# Patient Record
Sex: Male | Born: 1935 | Race: White | Hispanic: No | Marital: Single | Smoking: Former smoker
Health system: Southern US, Community
[De-identification: ages and names within clinical notes are randomized; demographics above are authoritative.]

## PROBLEM LIST (undated history)

## (undated) DIAGNOSIS — M1990 Unspecified osteoarthritis, unspecified site: Secondary | ICD-10-CM

## (undated) DIAGNOSIS — H409 Unspecified glaucoma: Secondary | ICD-10-CM

## (undated) DIAGNOSIS — I10 Essential (primary) hypertension: Secondary | ICD-10-CM

## (undated) DIAGNOSIS — I499 Cardiac arrhythmia, unspecified: Secondary | ICD-10-CM

## (undated) HISTORY — PX: JOINT REPLACEMENT: SHX530

## (undated) HISTORY — PX: EYE SURGERY: SHX253

## (undated) HISTORY — PX: OTHER SURGICAL HISTORY: SHX169

---

## 1998-10-09 ENCOUNTER — Ambulatory Visit (HOSPITAL_COMMUNITY): Admission: RE | Admit: 1998-10-09 | Discharge: 1998-10-09 | Payer: Self-pay | Admitting: Unknown Physician Specialty

## 1999-10-16 ENCOUNTER — Ambulatory Visit (HOSPITAL_COMMUNITY): Admission: RE | Admit: 1999-10-16 | Discharge: 1999-10-16 | Payer: Self-pay | Admitting: Unknown Physician Specialty

## 1999-10-16 ENCOUNTER — Encounter: Payer: Self-pay | Admitting: Unknown Physician Specialty

## 2005-11-11 ENCOUNTER — Ambulatory Visit: Payer: Self-pay | Admitting: Family Medicine

## 2014-09-13 ENCOUNTER — Encounter (INDEPENDENT_AMBULATORY_CARE_PROVIDER_SITE_OTHER): Payer: Self-pay | Admitting: Ophthalmology

## 2014-10-13 ENCOUNTER — Encounter (INDEPENDENT_AMBULATORY_CARE_PROVIDER_SITE_OTHER): Payer: Medicare HMO | Admitting: Ophthalmology

## 2014-10-13 DIAGNOSIS — H43812 Vitreous degeneration, left eye: Secondary | ICD-10-CM

## 2014-10-13 DIAGNOSIS — H35033 Hypertensive retinopathy, bilateral: Secondary | ICD-10-CM

## 2014-10-13 DIAGNOSIS — H35372 Puckering of macula, left eye: Secondary | ICD-10-CM

## 2014-10-13 DIAGNOSIS — H43813 Vitreous degeneration, bilateral: Secondary | ICD-10-CM

## 2014-10-13 DIAGNOSIS — I10 Essential (primary) hypertension: Secondary | ICD-10-CM

## 2014-11-14 ENCOUNTER — Encounter (INDEPENDENT_AMBULATORY_CARE_PROVIDER_SITE_OTHER): Payer: Medicare HMO | Admitting: Ophthalmology

## 2014-11-14 DIAGNOSIS — I10 Essential (primary) hypertension: Secondary | ICD-10-CM | POA: Diagnosis not present

## 2014-11-14 DIAGNOSIS — H43813 Vitreous degeneration, bilateral: Secondary | ICD-10-CM

## 2014-11-14 DIAGNOSIS — H3531 Nonexudative age-related macular degeneration: Secondary | ICD-10-CM

## 2014-11-14 DIAGNOSIS — H34832 Tributary (branch) retinal vein occlusion, left eye: Secondary | ICD-10-CM

## 2014-11-14 DIAGNOSIS — H35033 Hypertensive retinopathy, bilateral: Secondary | ICD-10-CM

## 2014-11-14 DIAGNOSIS — H35372 Puckering of macula, left eye: Secondary | ICD-10-CM | POA: Diagnosis not present

## 2014-12-12 ENCOUNTER — Encounter (INDEPENDENT_AMBULATORY_CARE_PROVIDER_SITE_OTHER): Payer: Medicare HMO | Admitting: Ophthalmology

## 2014-12-12 DIAGNOSIS — I10 Essential (primary) hypertension: Secondary | ICD-10-CM

## 2014-12-12 DIAGNOSIS — H43813 Vitreous degeneration, bilateral: Secondary | ICD-10-CM

## 2014-12-12 DIAGNOSIS — D3131 Benign neoplasm of right choroid: Secondary | ICD-10-CM

## 2014-12-12 DIAGNOSIS — H34832 Tributary (branch) retinal vein occlusion, left eye: Secondary | ICD-10-CM

## 2014-12-12 DIAGNOSIS — H35372 Puckering of macula, left eye: Secondary | ICD-10-CM

## 2014-12-12 DIAGNOSIS — H35033 Hypertensive retinopathy, bilateral: Secondary | ICD-10-CM

## 2015-01-23 ENCOUNTER — Encounter (INDEPENDENT_AMBULATORY_CARE_PROVIDER_SITE_OTHER): Payer: Medicare HMO | Admitting: Ophthalmology

## 2015-01-23 DIAGNOSIS — I10 Essential (primary) hypertension: Secondary | ICD-10-CM

## 2015-01-23 DIAGNOSIS — H43813 Vitreous degeneration, bilateral: Secondary | ICD-10-CM

## 2015-01-23 DIAGNOSIS — H35372 Puckering of macula, left eye: Secondary | ICD-10-CM

## 2015-01-23 DIAGNOSIS — D3131 Benign neoplasm of right choroid: Secondary | ICD-10-CM

## 2015-01-23 DIAGNOSIS — H35033 Hypertensive retinopathy, bilateral: Secondary | ICD-10-CM

## 2015-01-23 DIAGNOSIS — H34832 Tributary (branch) retinal vein occlusion, left eye: Secondary | ICD-10-CM

## 2015-03-06 ENCOUNTER — Encounter (INDEPENDENT_AMBULATORY_CARE_PROVIDER_SITE_OTHER): Payer: Medicare HMO | Admitting: Ophthalmology

## 2015-03-06 DIAGNOSIS — H43813 Vitreous degeneration, bilateral: Secondary | ICD-10-CM

## 2015-03-06 DIAGNOSIS — H35372 Puckering of macula, left eye: Secondary | ICD-10-CM

## 2015-03-06 DIAGNOSIS — H35033 Hypertensive retinopathy, bilateral: Secondary | ICD-10-CM | POA: Diagnosis not present

## 2015-03-06 DIAGNOSIS — D3131 Benign neoplasm of right choroid: Secondary | ICD-10-CM | POA: Diagnosis not present

## 2015-03-06 DIAGNOSIS — H34832 Tributary (branch) retinal vein occlusion, left eye: Secondary | ICD-10-CM | POA: Diagnosis not present

## 2015-03-06 DIAGNOSIS — I10 Essential (primary) hypertension: Secondary | ICD-10-CM

## 2015-04-24 ENCOUNTER — Encounter (INDEPENDENT_AMBULATORY_CARE_PROVIDER_SITE_OTHER): Payer: Medicare HMO | Admitting: Ophthalmology

## 2015-04-24 DIAGNOSIS — H3531 Nonexudative age-related macular degeneration: Secondary | ICD-10-CM

## 2015-04-24 DIAGNOSIS — H43813 Vitreous degeneration, bilateral: Secondary | ICD-10-CM

## 2015-04-24 DIAGNOSIS — H34832 Tributary (branch) retinal vein occlusion, left eye: Secondary | ICD-10-CM

## 2015-04-24 DIAGNOSIS — H35372 Puckering of macula, left eye: Secondary | ICD-10-CM | POA: Diagnosis not present

## 2015-04-24 DIAGNOSIS — H35033 Hypertensive retinopathy, bilateral: Secondary | ICD-10-CM | POA: Diagnosis not present

## 2015-04-24 DIAGNOSIS — D3131 Benign neoplasm of right choroid: Secondary | ICD-10-CM | POA: Diagnosis not present

## 2015-04-24 DIAGNOSIS — I10 Essential (primary) hypertension: Secondary | ICD-10-CM | POA: Diagnosis not present

## 2015-06-12 ENCOUNTER — Encounter (INDEPENDENT_AMBULATORY_CARE_PROVIDER_SITE_OTHER): Payer: Medicare HMO | Admitting: Ophthalmology

## 2015-06-12 DIAGNOSIS — H35033 Hypertensive retinopathy, bilateral: Secondary | ICD-10-CM

## 2015-06-12 DIAGNOSIS — H3531 Nonexudative age-related macular degeneration: Secondary | ICD-10-CM | POA: Diagnosis not present

## 2015-06-12 DIAGNOSIS — H43813 Vitreous degeneration, bilateral: Secondary | ICD-10-CM | POA: Diagnosis not present

## 2015-06-12 DIAGNOSIS — I10 Essential (primary) hypertension: Secondary | ICD-10-CM | POA: Diagnosis not present

## 2015-06-12 DIAGNOSIS — D3131 Benign neoplasm of right choroid: Secondary | ICD-10-CM

## 2015-06-12 DIAGNOSIS — H34832 Tributary (branch) retinal vein occlusion, left eye: Secondary | ICD-10-CM

## 2015-06-12 DIAGNOSIS — H35372 Puckering of macula, left eye: Secondary | ICD-10-CM

## 2015-07-17 ENCOUNTER — Encounter (INDEPENDENT_AMBULATORY_CARE_PROVIDER_SITE_OTHER): Payer: Medicare HMO | Admitting: Ophthalmology

## 2015-07-17 DIAGNOSIS — H35033 Hypertensive retinopathy, bilateral: Secondary | ICD-10-CM

## 2015-07-17 DIAGNOSIS — H34832 Tributary (branch) retinal vein occlusion, left eye: Secondary | ICD-10-CM | POA: Diagnosis not present

## 2015-07-17 DIAGNOSIS — H3531 Nonexudative age-related macular degeneration: Secondary | ICD-10-CM | POA: Diagnosis not present

## 2015-07-17 DIAGNOSIS — D3131 Benign neoplasm of right choroid: Secondary | ICD-10-CM

## 2015-07-17 DIAGNOSIS — H43813 Vitreous degeneration, bilateral: Secondary | ICD-10-CM | POA: Diagnosis not present

## 2015-07-17 DIAGNOSIS — H35372 Puckering of macula, left eye: Secondary | ICD-10-CM | POA: Diagnosis not present

## 2015-07-17 DIAGNOSIS — I10 Essential (primary) hypertension: Secondary | ICD-10-CM | POA: Diagnosis not present

## 2015-08-23 ENCOUNTER — Encounter (INDEPENDENT_AMBULATORY_CARE_PROVIDER_SITE_OTHER): Payer: Medicare HMO | Admitting: Ophthalmology

## 2015-08-23 DIAGNOSIS — D3131 Benign neoplasm of right choroid: Secondary | ICD-10-CM

## 2015-08-23 DIAGNOSIS — I10 Essential (primary) hypertension: Secondary | ICD-10-CM

## 2015-08-23 DIAGNOSIS — H34832 Tributary (branch) retinal vein occlusion, left eye: Secondary | ICD-10-CM | POA: Diagnosis not present

## 2015-08-23 DIAGNOSIS — H35372 Puckering of macula, left eye: Secondary | ICD-10-CM | POA: Diagnosis not present

## 2015-08-23 DIAGNOSIS — H43813 Vitreous degeneration, bilateral: Secondary | ICD-10-CM

## 2015-08-23 DIAGNOSIS — H35033 Hypertensive retinopathy, bilateral: Secondary | ICD-10-CM

## 2015-10-02 ENCOUNTER — Encounter (INDEPENDENT_AMBULATORY_CARE_PROVIDER_SITE_OTHER): Payer: Medicare HMO | Admitting: Ophthalmology

## 2015-10-10 ENCOUNTER — Encounter (INDEPENDENT_AMBULATORY_CARE_PROVIDER_SITE_OTHER): Payer: Medicare HMO | Admitting: Ophthalmology

## 2015-10-10 DIAGNOSIS — I10 Essential (primary) hypertension: Secondary | ICD-10-CM | POA: Diagnosis not present

## 2015-10-10 DIAGNOSIS — H353132 Nonexudative age-related macular degeneration, bilateral, intermediate dry stage: Secondary | ICD-10-CM | POA: Diagnosis not present

## 2015-10-10 DIAGNOSIS — D3131 Benign neoplasm of right choroid: Secondary | ICD-10-CM

## 2015-10-10 DIAGNOSIS — H35033 Hypertensive retinopathy, bilateral: Secondary | ICD-10-CM | POA: Diagnosis not present

## 2015-10-10 DIAGNOSIS — H35372 Puckering of macula, left eye: Secondary | ICD-10-CM

## 2015-10-10 DIAGNOSIS — H34832 Tributary (branch) retinal vein occlusion, left eye, with macular edema: Secondary | ICD-10-CM

## 2015-11-22 ENCOUNTER — Encounter (INDEPENDENT_AMBULATORY_CARE_PROVIDER_SITE_OTHER): Payer: Medicare HMO | Admitting: Ophthalmology

## 2015-11-22 DIAGNOSIS — H35033 Hypertensive retinopathy, bilateral: Secondary | ICD-10-CM

## 2015-11-22 DIAGNOSIS — I10 Essential (primary) hypertension: Secondary | ICD-10-CM

## 2015-11-22 DIAGNOSIS — H43813 Vitreous degeneration, bilateral: Secondary | ICD-10-CM | POA: Diagnosis not present

## 2015-11-22 DIAGNOSIS — H35372 Puckering of macula, left eye: Secondary | ICD-10-CM

## 2015-11-22 DIAGNOSIS — H353131 Nonexudative age-related macular degeneration, bilateral, early dry stage: Secondary | ICD-10-CM

## 2015-11-22 DIAGNOSIS — D3131 Benign neoplasm of right choroid: Secondary | ICD-10-CM

## 2015-11-22 DIAGNOSIS — H34832 Tributary (branch) retinal vein occlusion, left eye, with macular edema: Secondary | ICD-10-CM | POA: Diagnosis not present

## 2016-01-17 ENCOUNTER — Encounter (INDEPENDENT_AMBULATORY_CARE_PROVIDER_SITE_OTHER): Payer: Medicare HMO | Admitting: Ophthalmology

## 2016-01-17 DIAGNOSIS — H35372 Puckering of macula, left eye: Secondary | ICD-10-CM

## 2016-01-17 DIAGNOSIS — I10 Essential (primary) hypertension: Secondary | ICD-10-CM | POA: Diagnosis not present

## 2016-01-17 DIAGNOSIS — H353121 Nonexudative age-related macular degeneration, left eye, early dry stage: Secondary | ICD-10-CM

## 2016-01-17 DIAGNOSIS — H43813 Vitreous degeneration, bilateral: Secondary | ICD-10-CM | POA: Diagnosis not present

## 2016-01-17 DIAGNOSIS — H35033 Hypertensive retinopathy, bilateral: Secondary | ICD-10-CM

## 2016-01-17 DIAGNOSIS — H34832 Tributary (branch) retinal vein occlusion, left eye, with macular edema: Secondary | ICD-10-CM | POA: Diagnosis not present

## 2016-01-17 DIAGNOSIS — H353112 Nonexudative age-related macular degeneration, right eye, intermediate dry stage: Secondary | ICD-10-CM

## 2016-03-20 ENCOUNTER — Encounter (INDEPENDENT_AMBULATORY_CARE_PROVIDER_SITE_OTHER): Payer: Medicare HMO | Admitting: Ophthalmology

## 2016-03-20 DIAGNOSIS — H34832 Tributary (branch) retinal vein occlusion, left eye, with macular edema: Secondary | ICD-10-CM | POA: Diagnosis not present

## 2016-03-20 DIAGNOSIS — H353132 Nonexudative age-related macular degeneration, bilateral, intermediate dry stage: Secondary | ICD-10-CM

## 2016-03-20 DIAGNOSIS — I10 Essential (primary) hypertension: Secondary | ICD-10-CM | POA: Diagnosis not present

## 2016-03-20 DIAGNOSIS — H43813 Vitreous degeneration, bilateral: Secondary | ICD-10-CM | POA: Diagnosis not present

## 2016-03-20 DIAGNOSIS — H35033 Hypertensive retinopathy, bilateral: Secondary | ICD-10-CM | POA: Diagnosis not present

## 2016-05-29 ENCOUNTER — Encounter (INDEPENDENT_AMBULATORY_CARE_PROVIDER_SITE_OTHER): Payer: Medicare HMO | Admitting: Ophthalmology

## 2016-05-29 DIAGNOSIS — H35033 Hypertensive retinopathy, bilateral: Secondary | ICD-10-CM | POA: Diagnosis not present

## 2016-05-29 DIAGNOSIS — I10 Essential (primary) hypertension: Secondary | ICD-10-CM | POA: Diagnosis not present

## 2016-05-29 DIAGNOSIS — H35372 Puckering of macula, left eye: Secondary | ICD-10-CM

## 2016-05-29 DIAGNOSIS — H34832 Tributary (branch) retinal vein occlusion, left eye, with macular edema: Secondary | ICD-10-CM

## 2016-05-29 DIAGNOSIS — H353132 Nonexudative age-related macular degeneration, bilateral, intermediate dry stage: Secondary | ICD-10-CM

## 2016-05-29 DIAGNOSIS — H43813 Vitreous degeneration, bilateral: Secondary | ICD-10-CM

## 2016-08-07 ENCOUNTER — Encounter (INDEPENDENT_AMBULATORY_CARE_PROVIDER_SITE_OTHER): Payer: Medicare HMO | Admitting: Ophthalmology

## 2016-08-07 DIAGNOSIS — H34832 Tributary (branch) retinal vein occlusion, left eye, with macular edema: Secondary | ICD-10-CM | POA: Diagnosis not present

## 2016-08-07 DIAGNOSIS — D3131 Benign neoplasm of right choroid: Secondary | ICD-10-CM

## 2016-08-07 DIAGNOSIS — H35033 Hypertensive retinopathy, bilateral: Secondary | ICD-10-CM

## 2016-08-07 DIAGNOSIS — I10 Essential (primary) hypertension: Secondary | ICD-10-CM

## 2016-08-07 DIAGNOSIS — H43813 Vitreous degeneration, bilateral: Secondary | ICD-10-CM | POA: Diagnosis not present

## 2016-08-07 DIAGNOSIS — H353131 Nonexudative age-related macular degeneration, bilateral, early dry stage: Secondary | ICD-10-CM

## 2016-10-09 ENCOUNTER — Encounter (INDEPENDENT_AMBULATORY_CARE_PROVIDER_SITE_OTHER): Payer: Medicare HMO | Admitting: Ophthalmology

## 2016-10-09 DIAGNOSIS — I10 Essential (primary) hypertension: Secondary | ICD-10-CM

## 2016-10-09 DIAGNOSIS — H35033 Hypertensive retinopathy, bilateral: Secondary | ICD-10-CM

## 2016-10-09 DIAGNOSIS — H35372 Puckering of macula, left eye: Secondary | ICD-10-CM | POA: Diagnosis not present

## 2016-10-09 DIAGNOSIS — H43813 Vitreous degeneration, bilateral: Secondary | ICD-10-CM | POA: Diagnosis not present

## 2016-10-09 DIAGNOSIS — D3131 Benign neoplasm of right choroid: Secondary | ICD-10-CM | POA: Diagnosis not present

## 2016-10-09 DIAGNOSIS — H353132 Nonexudative age-related macular degeneration, bilateral, intermediate dry stage: Secondary | ICD-10-CM

## 2016-10-09 DIAGNOSIS — H34832 Tributary (branch) retinal vein occlusion, left eye, with macular edema: Secondary | ICD-10-CM | POA: Diagnosis not present

## 2016-12-11 ENCOUNTER — Encounter (INDEPENDENT_AMBULATORY_CARE_PROVIDER_SITE_OTHER): Payer: Medicare HMO | Admitting: Ophthalmology

## 2016-12-11 DIAGNOSIS — H35033 Hypertensive retinopathy, bilateral: Secondary | ICD-10-CM

## 2016-12-11 DIAGNOSIS — D3131 Benign neoplasm of right choroid: Secondary | ICD-10-CM | POA: Diagnosis not present

## 2016-12-11 DIAGNOSIS — H34832 Tributary (branch) retinal vein occlusion, left eye, with macular edema: Secondary | ICD-10-CM | POA: Diagnosis not present

## 2016-12-11 DIAGNOSIS — H43813 Vitreous degeneration, bilateral: Secondary | ICD-10-CM

## 2016-12-11 DIAGNOSIS — H35372 Puckering of macula, left eye: Secondary | ICD-10-CM

## 2016-12-11 DIAGNOSIS — I10 Essential (primary) hypertension: Secondary | ICD-10-CM | POA: Diagnosis not present

## 2016-12-11 DIAGNOSIS — H353132 Nonexudative age-related macular degeneration, bilateral, intermediate dry stage: Secondary | ICD-10-CM | POA: Diagnosis not present

## 2017-02-12 ENCOUNTER — Encounter (INDEPENDENT_AMBULATORY_CARE_PROVIDER_SITE_OTHER): Payer: Medicare HMO | Admitting: Ophthalmology

## 2017-02-12 DIAGNOSIS — H34832 Tributary (branch) retinal vein occlusion, left eye, with macular edema: Secondary | ICD-10-CM | POA: Diagnosis not present

## 2017-02-12 DIAGNOSIS — H35372 Puckering of macula, left eye: Secondary | ICD-10-CM | POA: Diagnosis not present

## 2017-02-12 DIAGNOSIS — H353132 Nonexudative age-related macular degeneration, bilateral, intermediate dry stage: Secondary | ICD-10-CM | POA: Diagnosis not present

## 2017-02-12 DIAGNOSIS — H35033 Hypertensive retinopathy, bilateral: Secondary | ICD-10-CM | POA: Diagnosis not present

## 2017-02-12 DIAGNOSIS — D3131 Benign neoplasm of right choroid: Secondary | ICD-10-CM

## 2017-02-12 DIAGNOSIS — I10 Essential (primary) hypertension: Secondary | ICD-10-CM | POA: Diagnosis not present

## 2017-02-12 DIAGNOSIS — H43813 Vitreous degeneration, bilateral: Secondary | ICD-10-CM | POA: Diagnosis not present

## 2017-04-23 ENCOUNTER — Encounter (INDEPENDENT_AMBULATORY_CARE_PROVIDER_SITE_OTHER): Payer: Medicare HMO | Admitting: Ophthalmology

## 2017-04-23 DIAGNOSIS — H43813 Vitreous degeneration, bilateral: Secondary | ICD-10-CM

## 2017-04-23 DIAGNOSIS — I10 Essential (primary) hypertension: Secondary | ICD-10-CM | POA: Diagnosis not present

## 2017-04-23 DIAGNOSIS — D3131 Benign neoplasm of right choroid: Secondary | ICD-10-CM | POA: Diagnosis not present

## 2017-04-23 DIAGNOSIS — H26492 Other secondary cataract, left eye: Secondary | ICD-10-CM | POA: Diagnosis not present

## 2017-04-23 DIAGNOSIS — H35033 Hypertensive retinopathy, bilateral: Secondary | ICD-10-CM | POA: Diagnosis not present

## 2017-04-23 DIAGNOSIS — H35372 Puckering of macula, left eye: Secondary | ICD-10-CM

## 2017-04-23 DIAGNOSIS — H34832 Tributary (branch) retinal vein occlusion, left eye, with macular edema: Secondary | ICD-10-CM | POA: Diagnosis not present

## 2017-07-02 ENCOUNTER — Encounter (INDEPENDENT_AMBULATORY_CARE_PROVIDER_SITE_OTHER): Payer: Medicare HMO | Admitting: Ophthalmology

## 2017-07-02 DIAGNOSIS — H353111 Nonexudative age-related macular degeneration, right eye, early dry stage: Secondary | ICD-10-CM

## 2017-07-02 DIAGNOSIS — D3131 Benign neoplasm of right choroid: Secondary | ICD-10-CM

## 2017-07-02 DIAGNOSIS — I10 Essential (primary) hypertension: Secondary | ICD-10-CM

## 2017-07-02 DIAGNOSIS — H35033 Hypertensive retinopathy, bilateral: Secondary | ICD-10-CM

## 2017-07-02 DIAGNOSIS — H353122 Nonexudative age-related macular degeneration, left eye, intermediate dry stage: Secondary | ICD-10-CM | POA: Diagnosis not present

## 2017-07-02 DIAGNOSIS — H43813 Vitreous degeneration, bilateral: Secondary | ICD-10-CM

## 2017-07-02 DIAGNOSIS — H34832 Tributary (branch) retinal vein occlusion, left eye, with macular edema: Secondary | ICD-10-CM | POA: Diagnosis not present

## 2017-07-02 DIAGNOSIS — H35372 Puckering of macula, left eye: Secondary | ICD-10-CM | POA: Diagnosis not present

## 2017-07-13 ENCOUNTER — Encounter (INDEPENDENT_AMBULATORY_CARE_PROVIDER_SITE_OTHER): Payer: Medicare HMO | Admitting: Ophthalmology

## 2017-07-13 DIAGNOSIS — H353132 Nonexudative age-related macular degeneration, bilateral, intermediate dry stage: Secondary | ICD-10-CM

## 2017-07-13 DIAGNOSIS — I10 Essential (primary) hypertension: Secondary | ICD-10-CM

## 2017-07-13 DIAGNOSIS — H35033 Hypertensive retinopathy, bilateral: Secondary | ICD-10-CM | POA: Diagnosis not present

## 2017-07-13 DIAGNOSIS — B0052 Herpesviral keratitis: Secondary | ICD-10-CM | POA: Diagnosis not present

## 2017-07-13 DIAGNOSIS — H43813 Vitreous degeneration, bilateral: Secondary | ICD-10-CM | POA: Diagnosis not present

## 2017-07-13 DIAGNOSIS — H34832 Tributary (branch) retinal vein occlusion, left eye, with macular edema: Secondary | ICD-10-CM

## 2017-09-03 ENCOUNTER — Encounter (INDEPENDENT_AMBULATORY_CARE_PROVIDER_SITE_OTHER): Payer: Medicare HMO | Admitting: Ophthalmology

## 2017-09-03 DIAGNOSIS — H26492 Other secondary cataract, left eye: Secondary | ICD-10-CM

## 2017-09-10 ENCOUNTER — Encounter (INDEPENDENT_AMBULATORY_CARE_PROVIDER_SITE_OTHER): Payer: Medicare HMO | Admitting: Ophthalmology

## 2017-09-10 DIAGNOSIS — H43813 Vitreous degeneration, bilateral: Secondary | ICD-10-CM

## 2017-09-10 DIAGNOSIS — I10 Essential (primary) hypertension: Secondary | ICD-10-CM | POA: Diagnosis not present

## 2017-09-10 DIAGNOSIS — H35033 Hypertensive retinopathy, bilateral: Secondary | ICD-10-CM

## 2017-09-10 DIAGNOSIS — D3131 Benign neoplasm of right choroid: Secondary | ICD-10-CM | POA: Diagnosis not present

## 2017-09-10 DIAGNOSIS — H34832 Tributary (branch) retinal vein occlusion, left eye, with macular edema: Secondary | ICD-10-CM

## 2017-09-10 DIAGNOSIS — H35372 Puckering of macula, left eye: Secondary | ICD-10-CM | POA: Diagnosis not present

## 2017-09-10 DIAGNOSIS — H353132 Nonexudative age-related macular degeneration, bilateral, intermediate dry stage: Secondary | ICD-10-CM | POA: Diagnosis not present

## 2017-11-19 ENCOUNTER — Encounter (INDEPENDENT_AMBULATORY_CARE_PROVIDER_SITE_OTHER): Payer: Medicare HMO | Admitting: Ophthalmology

## 2017-11-19 DIAGNOSIS — I10 Essential (primary) hypertension: Secondary | ICD-10-CM | POA: Diagnosis not present

## 2017-11-19 DIAGNOSIS — H34832 Tributary (branch) retinal vein occlusion, left eye, with macular edema: Secondary | ICD-10-CM | POA: Diagnosis not present

## 2017-11-19 DIAGNOSIS — D3131 Benign neoplasm of right choroid: Secondary | ICD-10-CM | POA: Diagnosis not present

## 2017-11-19 DIAGNOSIS — H353132 Nonexudative age-related macular degeneration, bilateral, intermediate dry stage: Secondary | ICD-10-CM | POA: Diagnosis not present

## 2017-11-19 DIAGNOSIS — H35372 Puckering of macula, left eye: Secondary | ICD-10-CM

## 2017-11-19 DIAGNOSIS — H35033 Hypertensive retinopathy, bilateral: Secondary | ICD-10-CM | POA: Diagnosis not present

## 2017-11-19 DIAGNOSIS — H43813 Vitreous degeneration, bilateral: Secondary | ICD-10-CM | POA: Diagnosis not present

## 2018-02-04 ENCOUNTER — Encounter (INDEPENDENT_AMBULATORY_CARE_PROVIDER_SITE_OTHER): Payer: Medicare HMO | Admitting: Ophthalmology

## 2018-02-04 DIAGNOSIS — I10 Essential (primary) hypertension: Secondary | ICD-10-CM

## 2018-02-04 DIAGNOSIS — H35372 Puckering of macula, left eye: Secondary | ICD-10-CM

## 2018-02-04 DIAGNOSIS — H353132 Nonexudative age-related macular degeneration, bilateral, intermediate dry stage: Secondary | ICD-10-CM | POA: Diagnosis not present

## 2018-02-04 DIAGNOSIS — H43813 Vitreous degeneration, bilateral: Secondary | ICD-10-CM

## 2018-02-04 DIAGNOSIS — H35033 Hypertensive retinopathy, bilateral: Secondary | ICD-10-CM

## 2018-02-04 DIAGNOSIS — H34832 Tributary (branch) retinal vein occlusion, left eye, with macular edema: Secondary | ICD-10-CM | POA: Diagnosis not present

## 2018-02-04 DIAGNOSIS — D3131 Benign neoplasm of right choroid: Secondary | ICD-10-CM | POA: Diagnosis not present

## 2018-03-25 ENCOUNTER — Encounter (INDEPENDENT_AMBULATORY_CARE_PROVIDER_SITE_OTHER): Payer: Medicare HMO | Admitting: Ophthalmology

## 2018-03-25 DIAGNOSIS — I10 Essential (primary) hypertension: Secondary | ICD-10-CM

## 2018-03-25 DIAGNOSIS — H34832 Tributary (branch) retinal vein occlusion, left eye, with macular edema: Secondary | ICD-10-CM

## 2018-03-25 DIAGNOSIS — H35033 Hypertensive retinopathy, bilateral: Secondary | ICD-10-CM | POA: Diagnosis not present

## 2018-03-25 DIAGNOSIS — D3131 Benign neoplasm of right choroid: Secondary | ICD-10-CM | POA: Diagnosis not present

## 2018-03-25 DIAGNOSIS — H353132 Nonexudative age-related macular degeneration, bilateral, intermediate dry stage: Secondary | ICD-10-CM | POA: Diagnosis not present

## 2018-03-25 DIAGNOSIS — H43813 Vitreous degeneration, bilateral: Secondary | ICD-10-CM

## 2018-03-25 DIAGNOSIS — H35372 Puckering of macula, left eye: Secondary | ICD-10-CM

## 2018-05-13 ENCOUNTER — Encounter (INDEPENDENT_AMBULATORY_CARE_PROVIDER_SITE_OTHER): Payer: Medicare HMO | Admitting: Ophthalmology

## 2018-05-13 DIAGNOSIS — H34832 Tributary (branch) retinal vein occlusion, left eye, with macular edema: Secondary | ICD-10-CM

## 2018-05-13 DIAGNOSIS — I10 Essential (primary) hypertension: Secondary | ICD-10-CM

## 2018-05-13 DIAGNOSIS — H353132 Nonexudative age-related macular degeneration, bilateral, intermediate dry stage: Secondary | ICD-10-CM

## 2018-05-13 DIAGNOSIS — H43813 Vitreous degeneration, bilateral: Secondary | ICD-10-CM

## 2018-05-13 DIAGNOSIS — D3131 Benign neoplasm of right choroid: Secondary | ICD-10-CM

## 2018-05-13 DIAGNOSIS — H35372 Puckering of macula, left eye: Secondary | ICD-10-CM

## 2018-05-13 DIAGNOSIS — H35033 Hypertensive retinopathy, bilateral: Secondary | ICD-10-CM | POA: Diagnosis not present

## 2018-07-01 ENCOUNTER — Encounter (INDEPENDENT_AMBULATORY_CARE_PROVIDER_SITE_OTHER): Payer: Medicare HMO | Admitting: Ophthalmology

## 2018-07-01 DIAGNOSIS — H43813 Vitreous degeneration, bilateral: Secondary | ICD-10-CM

## 2018-07-01 DIAGNOSIS — I10 Essential (primary) hypertension: Secondary | ICD-10-CM | POA: Diagnosis not present

## 2018-07-01 DIAGNOSIS — H35372 Puckering of macula, left eye: Secondary | ICD-10-CM

## 2018-07-01 DIAGNOSIS — D3131 Benign neoplasm of right choroid: Secondary | ICD-10-CM

## 2018-07-01 DIAGNOSIS — H34832 Tributary (branch) retinal vein occlusion, left eye, with macular edema: Secondary | ICD-10-CM | POA: Diagnosis not present

## 2018-07-01 DIAGNOSIS — H35033 Hypertensive retinopathy, bilateral: Secondary | ICD-10-CM

## 2018-07-01 DIAGNOSIS — H353131 Nonexudative age-related macular degeneration, bilateral, early dry stage: Secondary | ICD-10-CM

## 2018-08-26 ENCOUNTER — Encounter (INDEPENDENT_AMBULATORY_CARE_PROVIDER_SITE_OTHER): Payer: Medicare HMO | Admitting: Ophthalmology

## 2018-08-26 DIAGNOSIS — I10 Essential (primary) hypertension: Secondary | ICD-10-CM | POA: Diagnosis not present

## 2018-08-26 DIAGNOSIS — H353132 Nonexudative age-related macular degeneration, bilateral, intermediate dry stage: Secondary | ICD-10-CM | POA: Diagnosis not present

## 2018-08-26 DIAGNOSIS — H35033 Hypertensive retinopathy, bilateral: Secondary | ICD-10-CM | POA: Diagnosis not present

## 2018-08-26 DIAGNOSIS — H43813 Vitreous degeneration, bilateral: Secondary | ICD-10-CM

## 2018-08-26 DIAGNOSIS — H34832 Tributary (branch) retinal vein occlusion, left eye, with macular edema: Secondary | ICD-10-CM

## 2018-08-26 DIAGNOSIS — D3131 Benign neoplasm of right choroid: Secondary | ICD-10-CM

## 2018-08-26 DIAGNOSIS — H35372 Puckering of macula, left eye: Secondary | ICD-10-CM

## 2018-10-14 ENCOUNTER — Encounter (INDEPENDENT_AMBULATORY_CARE_PROVIDER_SITE_OTHER): Payer: Medicare HMO | Admitting: Ophthalmology

## 2018-10-14 DIAGNOSIS — D3131 Benign neoplasm of right choroid: Secondary | ICD-10-CM

## 2018-10-14 DIAGNOSIS — I10 Essential (primary) hypertension: Secondary | ICD-10-CM

## 2018-10-14 DIAGNOSIS — H35033 Hypertensive retinopathy, bilateral: Secondary | ICD-10-CM | POA: Diagnosis not present

## 2018-10-14 DIAGNOSIS — H353132 Nonexudative age-related macular degeneration, bilateral, intermediate dry stage: Secondary | ICD-10-CM | POA: Diagnosis not present

## 2018-10-14 DIAGNOSIS — H34832 Tributary (branch) retinal vein occlusion, left eye, with macular edema: Secondary | ICD-10-CM | POA: Diagnosis not present

## 2018-10-14 DIAGNOSIS — H43813 Vitreous degeneration, bilateral: Secondary | ICD-10-CM

## 2018-10-14 DIAGNOSIS — H35372 Puckering of macula, left eye: Secondary | ICD-10-CM

## 2018-12-09 ENCOUNTER — Encounter (INDEPENDENT_AMBULATORY_CARE_PROVIDER_SITE_OTHER): Payer: Medicare HMO | Admitting: Ophthalmology

## 2018-12-09 DIAGNOSIS — I10 Essential (primary) hypertension: Secondary | ICD-10-CM | POA: Diagnosis not present

## 2018-12-09 DIAGNOSIS — D3131 Benign neoplasm of right choroid: Secondary | ICD-10-CM

## 2018-12-09 DIAGNOSIS — H353132 Nonexudative age-related macular degeneration, bilateral, intermediate dry stage: Secondary | ICD-10-CM

## 2018-12-09 DIAGNOSIS — H35033 Hypertensive retinopathy, bilateral: Secondary | ICD-10-CM

## 2018-12-09 DIAGNOSIS — H34832 Tributary (branch) retinal vein occlusion, left eye, with macular edema: Secondary | ICD-10-CM

## 2018-12-09 DIAGNOSIS — H43813 Vitreous degeneration, bilateral: Secondary | ICD-10-CM

## 2018-12-09 DIAGNOSIS — H35372 Puckering of macula, left eye: Secondary | ICD-10-CM

## 2019-02-09 ENCOUNTER — Encounter (INDEPENDENT_AMBULATORY_CARE_PROVIDER_SITE_OTHER): Payer: Medicare HMO | Admitting: Ophthalmology

## 2019-02-09 DIAGNOSIS — H35033 Hypertensive retinopathy, bilateral: Secondary | ICD-10-CM

## 2019-02-09 DIAGNOSIS — H43813 Vitreous degeneration, bilateral: Secondary | ICD-10-CM

## 2019-02-09 DIAGNOSIS — H35372 Puckering of macula, left eye: Secondary | ICD-10-CM

## 2019-02-09 DIAGNOSIS — H353132 Nonexudative age-related macular degeneration, bilateral, intermediate dry stage: Secondary | ICD-10-CM | POA: Diagnosis not present

## 2019-02-09 DIAGNOSIS — H34832 Tributary (branch) retinal vein occlusion, left eye, with macular edema: Secondary | ICD-10-CM | POA: Diagnosis not present

## 2019-02-09 DIAGNOSIS — I10 Essential (primary) hypertension: Secondary | ICD-10-CM | POA: Diagnosis not present

## 2019-02-09 DIAGNOSIS — D3131 Benign neoplasm of right choroid: Secondary | ICD-10-CM

## 2019-04-07 ENCOUNTER — Other Ambulatory Visit: Payer: Self-pay

## 2019-04-07 ENCOUNTER — Encounter (INDEPENDENT_AMBULATORY_CARE_PROVIDER_SITE_OTHER): Payer: Medicare HMO | Admitting: Ophthalmology

## 2019-04-07 DIAGNOSIS — H35033 Hypertensive retinopathy, bilateral: Secondary | ICD-10-CM | POA: Diagnosis not present

## 2019-04-07 DIAGNOSIS — H35372 Puckering of macula, left eye: Secondary | ICD-10-CM

## 2019-04-07 DIAGNOSIS — I10 Essential (primary) hypertension: Secondary | ICD-10-CM | POA: Diagnosis not present

## 2019-04-07 DIAGNOSIS — H353132 Nonexudative age-related macular degeneration, bilateral, intermediate dry stage: Secondary | ICD-10-CM | POA: Diagnosis not present

## 2019-04-07 DIAGNOSIS — D3131 Benign neoplasm of right choroid: Secondary | ICD-10-CM

## 2019-04-07 DIAGNOSIS — H34832 Tributary (branch) retinal vein occlusion, left eye, with macular edema: Secondary | ICD-10-CM

## 2019-04-07 DIAGNOSIS — H43813 Vitreous degeneration, bilateral: Secondary | ICD-10-CM

## 2019-06-02 ENCOUNTER — Other Ambulatory Visit: Payer: Self-pay

## 2019-06-02 ENCOUNTER — Encounter (INDEPENDENT_AMBULATORY_CARE_PROVIDER_SITE_OTHER): Payer: Medicare HMO | Admitting: Ophthalmology

## 2019-06-02 DIAGNOSIS — H34832 Tributary (branch) retinal vein occlusion, left eye, with macular edema: Secondary | ICD-10-CM

## 2019-06-02 DIAGNOSIS — H43813 Vitreous degeneration, bilateral: Secondary | ICD-10-CM

## 2019-06-02 DIAGNOSIS — I10 Essential (primary) hypertension: Secondary | ICD-10-CM

## 2019-06-02 DIAGNOSIS — H353132 Nonexudative age-related macular degeneration, bilateral, intermediate dry stage: Secondary | ICD-10-CM | POA: Diagnosis not present

## 2019-06-02 DIAGNOSIS — D3131 Benign neoplasm of right choroid: Secondary | ICD-10-CM

## 2019-06-02 DIAGNOSIS — H35372 Puckering of macula, left eye: Secondary | ICD-10-CM

## 2019-06-02 DIAGNOSIS — H35033 Hypertensive retinopathy, bilateral: Secondary | ICD-10-CM | POA: Diagnosis not present

## 2019-07-28 ENCOUNTER — Other Ambulatory Visit: Payer: Self-pay

## 2019-07-28 ENCOUNTER — Encounter (INDEPENDENT_AMBULATORY_CARE_PROVIDER_SITE_OTHER): Payer: Medicare HMO | Admitting: Ophthalmology

## 2019-07-28 DIAGNOSIS — H353132 Nonexudative age-related macular degeneration, bilateral, intermediate dry stage: Secondary | ICD-10-CM | POA: Diagnosis not present

## 2019-07-28 DIAGNOSIS — H34832 Tributary (branch) retinal vein occlusion, left eye, with macular edema: Secondary | ICD-10-CM

## 2019-07-28 DIAGNOSIS — H35372 Puckering of macula, left eye: Secondary | ICD-10-CM

## 2019-07-28 DIAGNOSIS — H35033 Hypertensive retinopathy, bilateral: Secondary | ICD-10-CM

## 2019-07-28 DIAGNOSIS — I10 Essential (primary) hypertension: Secondary | ICD-10-CM | POA: Diagnosis not present

## 2019-07-28 DIAGNOSIS — H43813 Vitreous degeneration, bilateral: Secondary | ICD-10-CM | POA: Diagnosis not present

## 2019-08-03 ENCOUNTER — Encounter (HOSPITAL_COMMUNITY): Admission: RE | Admit: 2019-08-03 | Payer: Self-pay | Source: Ambulatory Visit

## 2019-08-08 ENCOUNTER — Ambulatory Visit: Admit: 2019-08-08 | Payer: Self-pay | Admitting: Orthopedic Surgery

## 2019-08-08 SURGERY — ARTHROPLASTY, KNEE, TOTAL
Anesthesia: Choice | Laterality: Left

## 2019-09-08 ENCOUNTER — Other Ambulatory Visit (HOSPITAL_COMMUNITY)
Admission: RE | Admit: 2019-09-08 | Discharge: 2019-09-08 | Disposition: A | Discharge status: Home / Self Care | Payer: Medicare HMO | Source: Ambulatory Visit | Attending: Orthopedic Surgery | Admitting: Orthopedic Surgery

## 2019-09-08 DIAGNOSIS — Z01812 Encounter for preprocedural laboratory examination: Secondary | ICD-10-CM | POA: Insufficient documentation

## 2019-09-08 DIAGNOSIS — Z20828 Contact with and (suspected) exposure to other viral communicable diseases: Secondary | ICD-10-CM | POA: Diagnosis not present

## 2019-09-08 NOTE — H&P (Signed)
TOTAL KNEE ADMISSION H&P  Patient is being admitted for left total knee arthroplasty.  Subjective:  Chief Complaint:left knee pain.  HPI: Mathew Simpson, 83 y.o. male, has a history of pain and functional disability in the left knee due to arthritis and has failed non-surgical conservative treatments for greater than 12 weeks to includeNSAID's and/or analgesics, corticosteriod injections, flexibility and strengthening excercises and activity modification.  Onset of symptoms was gradual, starting >10 years ago with gradually worsening course since that time. The patient noted no past surgery on the left knee(s).  Patient currently rates pain in the left knee(s) at 7 out of 10 with activity. Patient has night pain, worsening of pain with activity and weight bearing, pain that interferes with activities of daily living, pain with passive range of motion, crepitus and joint swelling.  Patient has evidence of periarticular osteophytes and joint space narrowing by imaging studies. There is no active infection.   Current Outpatient Medications  Medication Sig Dispense Refill Last Dose  . apixaban (ELIQUIS) 5 MG TABS tablet Take 5 mg by mouth 2 (two) times daily.     . Calcium Citrate-Vitamin D (CALCIUM + D PO) Take 1 tablet by mouth 2 (two) times daily.     . Cholecalciferol (DIALYVITE VITAMIN D 5000) 125 MCG (5000 UT) capsule Take 5,000 Units by mouth daily.     Marland Kitchen diltiazem (TIAZAC) 240 MG 24 hr capsule Take 240 mg by mouth daily.     Marland Kitchen gabapentin (NEURONTIN) 300 MG capsule Take 300 mg by mouth 3 (three) times daily.     Marland Kitchen HYDROcodone-acetaminophen (NORCO) 7.5-325 MG tablet Take 1 tablet by mouth every 6 (six) hours as needed for moderate pain.     Marland Kitchen lisinopril (ZESTRIL) 20 MG tablet Take 20 mg by mouth daily.     . Multiple Vitamin (MULTIVITAMIN WITH MINERALS) TABS tablet Take 1 tablet by mouth daily.     . Multiple Vitamins-Minerals (PRESERVISION AREDS 2) CAPS Take 1 capsule by mouth 2 (two) times  daily.     . Psyllium (METAMUCIL PO) Take 1 Scoop by mouth daily as needed (constipation).      . trimethoprim-polymyxin b (POLYTRIM) ophthalmic solution Place 1 drop into the left eye See admin instructions. Instill 1 drop into left eye 4 times daily for 2 days after eye injection      Allergies  Allergen Reactions  . Penicillins Anaphylaxis    Did it involve swelling of the face/tongue/throat, SOB, or low BP? Yes Did it involve sudden or severe rash/hives, skin peeling, or any reaction on the inside of your mouth or nose? Unknown Did you need to seek medical attention at a hospital or doctor's office? Yes When did it last happen?50+ years If all above answers are "NO", may proceed with cephalosporin use.     Social History   Tobacco Use  . Smoking status: Former smoker  Substance Use Topics  . Alcohol use: Occasional     Review of Systems  Constitutional: Negative.   HENT: Positive for hearing loss. Negative for congestion, ear discharge, ear pain, nosebleeds, sinus pain, sore throat and tinnitus.   Eyes: Negative.   Respiratory: Negative.  Negative for stridor.   Gastrointestinal: Negative.   Genitourinary: Negative.   Musculoskeletal: Positive for joint pain and myalgias. Negative for back pain, falls and neck pain.  Skin: Negative.   Neurological: Negative.   Endo/Heme/Allergies: Negative.   Psychiatric/Behavioral: Negative.     Objective:  Physical Exam  Constitutional: He is  oriented to person, place, and time. He appears well-developed and well-nourished. No distress.  HENT:  Head: Normocephalic and atraumatic.  Right Ear: External ear normal.  Left Ear: External ear normal.  Nose: Nose normal.  Mouth/Throat: Oropharynx is clear and moist.  Eyes: Conjunctivae and EOM are normal.  Neck: Normal range of motion. Neck supple.  Cardiovascular: Normal rate, normal heart sounds and intact distal pulses. An irregularly irregular rhythm present.  No murmur  heard. Respiratory: Effort normal and breath sounds normal. No respiratory distress. He has no wheezes.  GI: Soft. Bowel sounds are normal. He exhibits no distension. There is no abdominal tenderness.  Musculoskeletal:     Comments: Bilateral hips have normal range of motion without discomfort.  Right Knee Exam: Shows significant varus deformity. No effusion present. No swelling present. The range of motion is: 5 to 125 degrees. No crepitus on range of motion of the knee. Medial joint line tenderness. Lateral joint line tenderness. The knee is stable.  Left Knee Exam: Shows significant varus deformity. No effusion present. No swelling present. The range of motion is: 10 to 120 degrees. Moderate crepitus on range of motion of the knee. Medial joint line tenderness. Lateral joint line tenderness. The knee is stable.  Neurological: He is alert and oriented to person, place, and time. He has normal strength. No sensory deficit.  Skin: No rash noted. He is not diaphoretic. No erythema.  Psychiatric: He has a normal mood and affect. His behavior is normal.    Vitals Ht: 5 ft 9 in  Wt: 180 lbs  BMI: 26.6  BP: 130/74 sitting L arm  Pulse: 72 bpm   Imaging Review Plain radiographs demonstrate severe degenerative joint disease of the left knee(s). The overall alignment ismild varus. The bone quality appears to be good for age and reported activity level.    Assessment/Plan:  End stage primary osteoarthritis, left knee   The patient history, physical examination, clinical judgment of the provider and imaging studies are consistent with end stage degenerative joint disease of the left knee(s) and total knee arthroplasty is deemed medically necessary. The treatment options including medical management, injection therapy arthroscopy and arthroplasty were discussed at length. The risks and benefits of total knee arthroplasty were presented and reviewed. The risks due to aseptic  loosening, infection, stiffness, patella tracking problems, thromboembolic complications and other imponderables were discussed. The patient acknowledged the explanation, agreed to proceed with the plan and consent was signed. Patient is being admitted for inpatient treatment for surgery, pain control, PT, OT, prophylactic antibiotics, VTE prophylaxis, progressive ambulation and ADL's and discharge planning. The patient is planning to be discharged home with outpatient therapy.    Anticipated LOS equal to or greater than 2 midnights due to - Age 63 and older with one or more of the following:  - Obesity  - Expected need for hospital services (PT, OT, Nursing) required for safe  discharge  - Anticipated need for postoperative skilled nursing care or inpatient rehab  - Active co-morbidities: Cardiac Arrhythmia OR   - Unanticipated findings during/Post Surgery: None  - Patient is a high risk of re-admission due to: None   Risks and benefits of the surgery were discussed with the patient and Dr.Aluisio at their previous office visit, and the patient has elected to move forward with the aforementioned surgery. Post-operative care plans were discussed with the patient today.  Therapy Plans: outpatient therapy at [facility] Disposition: Home with family Planned DVT prophylaxis: resume Eliquis DME needed: none  PCP: Isaias Cowman, PA-C Clearance received  Other: no anesthesia concerns  Instructed patient on meds to stop prior to surgery  Ardeen Jourdain, PA-C

## 2019-09-08 NOTE — Patient Instructions (Signed)
DUE TO COVID-19 ONLY ONE VISITOR IS ALLOWED TO COME WITH YOU AND STAY IN THE WAITING ROOM ONLY DURING PRE OP AND PROCEDURE DAY OF SURGERY. THE 1 VISITOR MAY VISIT WITH YOU AFTER SURGERY IN YOUR PRIVATE ROOM DURING VISITING HOURS ONLY!  YOU NEED TO HAVE A COVID 19 TEST ON_______ @_______ , THIS TEST MUST BE DONE BEFORE SURGERY, COME  [address], [place] , [address].  ([place]) ONCE YOUR COVID TEST IS COMPLETED, PLEASE BEGIN THE QUARANTINE INSTRUCTIONS AS OUTLINED IN YOUR HANDOUT.                Mathew Simpson  09/08/2019   Your procedure is scheduled on: 09-12-19   Report to [facility] Main  Entrance   Report to admitting at       0800 AM     Call this number if you have problems the morning of surgery [phone]    Remember: NO SOLID FOOD AFTER MIDNIGHT THE NIGHT PRIOR TO SURGERY. NOTHING BY MOUTH EXCEPT CLEAR LIQUIDS UNTIL     0730 am . PLEASE FINISH ENSURE DRINK PER SURGEON ORDER  WHICH NEEDS TO BE COMPLETED AT     0730 am then nothing by mouth.     CLEAR LIQUID DIET   Foods Allowed                                                                     Foods Excluded  Coffee and tea, regular and decaf                             liquids that you cannot  Plain Jell-O any favor except red or purple                                           see through such as: Fruit ices (not with fruit pulp)                                     milk, soups, orange juice  Iced Popsicles                                    All solid food Carbonated beverages, regular and diet                                    Cranberry, grape and apple juices Sports drinks like Gatorade Lightly seasoned clear broth or consume(fat free) Sugar, honey syrup  _____________________________________________________________________    BRUSH YOUR TEETH MORNING OF SURGERY AND RINSE YOUR MOUTH OUT, NO CHEWING GUM CANDY OR MINTS.     Take these medicines the morning of surgery with A SIP  OF WATER: eye drops if needed , gabapentin, diltiazem  You may not have any metal on your body including hair pins and              piercings  Do not wear jewelry, lotions, powders or perfumes, deodorant                   Men may shave face and neck.   Do not bring valuables to the hospital. [place].  Contacts, dentures or bridgework may not be worn into surgery.                 Please read over the following fact sheets you were given: _____________________________________________________________________           [facility] - Preparing for Surgery Before surgery, you can play an important role.  Because skin is not sterile, your skin needs to be as free of germs as possible.  You can reduce the number of germs on your skin by washing with CHG (chlorahexidine gluconate) soap before surgery.  CHG is an antiseptic cleaner which kills germs and bonds with the skin to continue killing germs even after washing. Please DO NOT use if you have an allergy to CHG or antibacterial soaps.  If your skin becomes reddened/irritated stop using the CHG and inform your nurse when you arrive at Short Stay. Do not shave (including legs and underarms) for at least 48 hours prior to the first CHG shower.  You may shave your face/neck. Please follow these instructions carefully:  1.  Shower with CHG Soap the night before surgery and the  morning of Surgery.  2.  If you choose to wash your hair, wash your hair first as usual with your  normal  shampoo.  3.  After you shampoo, rinse your hair and body thoroughly to remove the  shampoo.                           4.  Use CHG as you would any other liquid soap.  You can apply chg directly  to the skin and wash                       Gently with a scrungie or clean washcloth.  5.  Apply the CHG Soap to your body ONLY FROM THE NECK DOWN.   Do not use on face/ open                            Wound or open sores. Avoid contact with eyes, ears mouth and genitals (private parts).                       Wash face,  Genitals (private parts) with your normal soap.             6.  Wash thoroughly, paying special attention to the area where your surgery  will be performed.  7.  Thoroughly rinse your body with warm water from the neck down.  8.  DO NOT shower/wash with your normal soap after using and rinsing off  the CHG Soap.                9.  Pat yourself dry with a clean towel.  10.  Wear clean pajamas.            11.  Place clean sheets on your bed the night of your first shower and do not  sleep with pets. Day of Surgery : Do not apply any lotions/deodorants the morning of surgery.  Please wear clean clothes to the hospital/surgery center.  FAILURE TO FOLLOW THESE INSTRUCTIONS MAY RESULT IN THE CANCELLATION OF YOUR SURGERY PATIENT SIGNATURE_________________________________  NURSE SIGNATURE__________________________________  ________________________________________________________________________   Mathew Simpson  An incentive spirometer is a tool that can help keep your lungs clear and active. This tool measures how well you are filling your lungs with each breath. Taking long deep breaths may help reverse or decrease the chance of developing breathing (pulmonary) problems (especially infection) following:  A long period of time when you are unable to move or be active. BEFORE THE PROCEDURE   If the spirometer includes an indicator to show your best effort, your nurse or respiratory therapist will set it to a desired goal.  If possible, sit up straight or lean slightly forward. Try not to slouch.  Hold the incentive spirometer in an upright position. INSTRUCTIONS FOR USE  1. Sit on the edge of your bed if possible, or sit up as far as you can in bed or on a chair. 2. Hold the incentive spirometer in an upright position. 3. Breathe out  normally. 4. Place the mouthpiece in your mouth and seal your lips tightly around it. 5. Breathe in slowly and as deeply as possible, raising the piston or the ball toward the top of the column. 6. Hold your breath for 3-5 seconds or for as long as possible. Allow the piston or ball to fall to the bottom of the column. 7. Remove the mouthpiece from your mouth and breathe out normally. 8. Rest for a few seconds and repeat Steps 1 through 7 at least 10 times every 1-2 hours when you are awake. Take your time and take a few normal breaths between deep breaths. 9. The spirometer may include an indicator to show your best effort. Use the indicator as a goal to work toward during each repetition. 10. After each set of 10 deep breaths, practice coughing to be sure your lungs are clear. If you have an incision (the cut made at the time of surgery), support your incision when coughing by placing a pillow or rolled up towels firmly against it. Once you are able to get out of bed, walk around indoors and cough well. You may stop using the incentive spirometer when instructed by your caregiver.  RISKS AND COMPLICATIONS  Take your time so you do not get dizzy or light-headed.  If you are in pain, you may need to take or ask for pain medication before doing incentive spirometry. It is harder to take a deep breath if you are having pain. AFTER USE  Rest and breathe slowly and easily.  It can be helpful to keep track of a log of your progress. Your caregiver can provide you with a simple table to help with this. If you are using the spirometer at home, follow these instructions: [place] IF:   You are having difficultly using the spirometer.  You have trouble using the spirometer as often as instructed.  Your pain medication is not giving enough relief while using the spirometer.  You develop fever of 100.5 F (38.1 C) or higher. SEEK IMMEDIATE MEDICAL CARE IF:   You cough up bloody sputum  that had not been present before.  You develop fever of 102 F (38.9 C) or greater.  You develop worsening pain at or near the incision site. MAKE SURE YOU:   Understand these instructions.  Will watch your condition.  Will get help right away if you are not doing well or get worse. Document Released: 04/20/2007 Document Revised: 03/01/2012 Document Reviewed: 06/21/2007 Exit[employer] Patient Information 2014 [employer], [place].   ________________________________________________________________________  WHAT IS A BLOOD TRANSFUSION? Blood Transfusion Information  A transfusion is the replacement of blood or some of its parts. Blood is made up of multiple cells which provide different functions.  Red blood cells carry oxygen and are used for blood loss replacement.  White blood cells fight against infection.  Platelets control bleeding.  Plasma helps clot blood.  Other blood products are available for specialized needs, such as hemophilia or other clotting disorders. BEFORE THE TRANSFUSION  Who gives blood for transfusions?   Healthy volunteers who are fully evaluated to make sure their blood is safe. This is blood bank blood. Transfusion therapy is the safest it has ever been in the practice of medicine. Before blood is taken from a donor, a complete history is taken to make sure that person has no history of diseases nor engages in risky social behavior (examples are intravenous drug use or sexual activity with multiple partners). The donor's travel history is screened to minimize risk of transmitting infections, such as malaria. The donated blood is tested for signs of infectious diseases, such as HIV and hepatitis. The blood is then tested to be sure it is compatible with you in order to minimize the chance of a transfusion reaction. If you or a relative donates blood, this is often done in anticipation of surgery and is not appropriate for emergency situations. It takes many days to  process the donated blood. RISKS AND COMPLICATIONS Although transfusion therapy is very safe and saves many lives, the main dangers of transfusion include:   Getting an infectious disease.  Developing a transfusion reaction. This is an allergic reaction to something in the blood you were given. Every precaution is taken to prevent this. The decision to have a blood transfusion has been considered carefully by your caregiver before blood is given. Blood is not given unless the benefits outweigh the risks. AFTER THE TRANSFUSION  Right after receiving a blood transfusion, you will usually feel much better and more energetic. This is especially true if your red blood cells have gotten low (anemic). The transfusion raises the level of the red blood cells which carry oxygen, and this usually causes an energy increase.  The nurse administering the transfusion will monitor you carefully for complications. HOME CARE INSTRUCTIONS  No special instructions are needed after a transfusion. You may find your energy is better. Speak with your caregiver about any limitations on activity for underlying diseases you may have. SEEK MEDICAL CARE IF:   Your condition is not improving after your transfusion.  You develop redness or irritation at the intravenous (IV) site. SEEK IMMEDIATE MEDICAL CARE IF:  Any of the following symptoms occur over the next 12 hours:  Shaking chills.  You have a temperature by mouth above 102 F (38.9 C), not controlled by medicine.  Chest, back, or muscle pain.  People around you feel you are not acting correctly or are confused.  Shortness of breath or difficulty breathing.  Dizziness and fainting.  You get a rash or develop hives.  You have a decrease in  urine output.  Your urine turns a dark color or changes to pink, red, or brown. Any of the following symptoms occur over the next 10 days:  You have a temperature by mouth above 102 F (38.9 C), not controlled by  medicine.  Shortness of breath.  Weakness after normal activity.  The white part of the eye turns yellow (jaundice).  You have a decrease in the amount of urine or are urinating less often.  Your urine turns a dark color or changes to pink, red, or brown. Document Released: 12/05/2000 Document Revised: 03/01/2012 Document Reviewed: 07/24/2008 [facility] Patient Information 2014 [place], [place].  _______________________________________________________________________

## 2019-09-08 NOTE — Progress Notes (Signed)
PCP - Isaias Cowman   lov 08-31-19 on chart  Cardiologist -   PPM/ICD -  Device Orders -  Rep Notified  Chest x-ray -  EKG - 08-31-19 on chart and one in epic Stress Test -  ECHO -  Cardiac Cath -   Sleep Study -  CPAP -   Fasting Blood Sugar -  Checks Blood Sugar _____ times a day  Blood Thinner Instructions: elequis  Stop 09-08-19 Aspirin Instructions:  ERAS Protcol -yes  PRE-SURGERY Ensure - yes  COVID TEST- 09-08-19   Anesthesia review: abn ekg with hx of a fib   Patient denies shortness of breath, fever, cough and chest pain at PAT appointment  none   Patient verbalized understanding of instructions that were given to them at the PAT appointment. Patient was also instructed that they will need to review over the PAT instructions again at home before surgery.

## 2019-09-09 ENCOUNTER — Encounter (HOSPITAL_COMMUNITY)
Admission: RE | Admit: 2019-09-09 | Discharge: 2019-09-09 | Disposition: A | Discharge status: Home / Self Care | Payer: Medicare HMO | Source: Ambulatory Visit | Attending: Orthopedic Surgery | Admitting: Orthopedic Surgery

## 2019-09-09 ENCOUNTER — Encounter (HOSPITAL_COMMUNITY): Payer: Self-pay

## 2019-09-09 ENCOUNTER — Other Ambulatory Visit: Payer: Self-pay

## 2019-09-09 DIAGNOSIS — M1712 Unilateral primary osteoarthritis, left knee: Secondary | ICD-10-CM | POA: Diagnosis not present

## 2019-09-09 DIAGNOSIS — Z01812 Encounter for preprocedural laboratory examination: Secondary | ICD-10-CM | POA: Insufficient documentation

## 2019-09-09 HISTORY — DX: Unspecified glaucoma: H40.9

## 2019-09-09 HISTORY — DX: Cardiac arrhythmia, unspecified: I49.9

## 2019-09-09 HISTORY — DX: Unspecified osteoarthritis, unspecified site: M19.90

## 2019-09-09 HISTORY — DX: Essential (primary) hypertension: I10

## 2019-09-09 LAB — CBC WITH DIFFERENTIAL/PLATELET
Abs Immature Granulocytes: 0.03 10*3/uL (ref 0.00–0.07)
Basophils Absolute: 0 10*3/uL (ref 0.0–0.1)
Basophils Relative: 0 %
Eosinophils Absolute: 0.1 10*3/uL (ref 0.0–0.5)
Eosinophils Relative: 2 %
HCT: 42.1 % (ref 39.0–52.0)
Hemoglobin: 13.6 g/dL (ref 13.0–17.0)
Immature Granulocytes: 0 %
Lymphocytes Relative: 18 %
Lymphs Abs: 1.2 10*3/uL (ref 0.7–4.0)
MCH: 30.1 pg (ref 26.0–34.0)
MCHC: 32.3 g/dL (ref 30.0–36.0)
MCV: 93.1 fL (ref 80.0–100.0)
Monocytes Absolute: 0.5 10*3/uL (ref 0.1–1.0)
Monocytes Relative: 8 %
Neutro Abs: 4.9 10*3/uL (ref 1.7–7.7)
Neutrophils Relative %: 72 %
Platelets: 212 10*3/uL (ref 150–400)
RBC: 4.52 MIL/uL (ref 4.22–5.81)
RDW: 13.2 % (ref 11.5–15.5)
WBC: 6.8 10*3/uL (ref 4.0–10.5)
nRBC: 0 % (ref 0.0–0.2)

## 2019-09-09 LAB — COMPREHENSIVE METABOLIC PANEL
ALT: 22 U/L (ref 0–44)
AST: 23 U/L (ref 15–41)
Albumin: 3.9 g/dL (ref 3.5–5.0)
Alkaline Phosphatase: 66 U/L (ref 38–126)
Anion gap: 5 (ref 5–15)
BUN: 20 mg/dL (ref 8–23)
CO2: 28 mmol/L (ref 22–32)
Calcium: 8.8 mg/dL — ABNORMAL LOW (ref 8.9–10.3)
Chloride: 109 mmol/L (ref 98–111)
Creatinine, Ser: 0.85 mg/dL (ref 0.61–1.24)
GFR calc Af Amer: >60 mL/min (ref >60)
GFR calc non Af Amer: >60 mL/min (ref >60)
Glucose, Bld: 115 mg/dL — ABNORMAL HIGH (ref 70–99)
Potassium: 4.8 mmol/L (ref 3.5–5.1)
Sodium: 142 mmol/L (ref 135–145)
Total Bilirubin: 0.5 mg/dL (ref 0.3–1.2)
Total Protein: 6.5 g/dL (ref 6.5–8.1)

## 2019-09-09 LAB — PROTIME-INR
INR: 1.1 (ref 0.8–1.2)
Prothrombin Time: 13.9 seconds (ref 11.4–15.2)

## 2019-09-09 LAB — SURGICAL PCR SCREEN
MRSA, PCR: NEGATIVE
Staphylococcus aureus: NEGATIVE

## 2019-09-09 LAB — NOVEL CORONAVIRUS, NAA (HOSP ORDER, SEND-OUT TO REF LAB; TAT 18-24 HRS): SARS-CoV-2, NAA: NOT DETECTED

## 2019-09-09 LAB — APTT: aPTT: 29 seconds (ref 24–36)

## 2019-09-09 LAB — ABO/RH: ABO/RH(D): O POS

## 2019-09-10 LAB — HEMOGLOBIN A1C
Hgb A1c MFr Bld: 6.1 % — ABNORMAL HIGH (ref 4.8–5.6)
Mean Plasma Glucose: 128 mg/dL

## 2019-09-11 MED ORDER — BUPIVACAINE LIPOSOME 1.3 % IJ SUSP
20.0000 mL | Freq: Once | INTRAMUSCULAR | Status: DC
  Filled 2019-09-11: qty 20

## 2019-09-12 ENCOUNTER — Ambulatory Visit (HOSPITAL_COMMUNITY)
Admission: RE | Admit: 2019-09-12 | Discharge: 2019-09-13 | Disposition: A | Discharge status: Home / Self Care | Payer: Medicare HMO | Source: Home / Self Care | Attending: Orthopedic Surgery | Admitting: Orthopedic Surgery

## 2019-09-12 ENCOUNTER — Encounter (HOSPITAL_COMMUNITY)
Admission: RE | Disposition: A | Discharge status: Home / Self Care | Payer: Self-pay | Source: Home / Self Care | Attending: Orthopedic Surgery

## 2019-09-12 ENCOUNTER — Ambulatory Visit (HOSPITAL_COMMUNITY): Payer: Medicare HMO | Admitting: Certified Registered Nurse Anesthetist

## 2019-09-12 ENCOUNTER — Ambulatory Visit (HOSPITAL_COMMUNITY): Payer: Medicare HMO | Admitting: Physician Assistant

## 2019-09-12 ENCOUNTER — Encounter (HOSPITAL_COMMUNITY): Payer: Self-pay

## 2019-09-12 ENCOUNTER — Other Ambulatory Visit: Payer: Self-pay

## 2019-09-12 DIAGNOSIS — Z6827 Body mass index (BMI) 27.0-27.9, adult: Secondary | ICD-10-CM | POA: Insufficient documentation

## 2019-09-12 DIAGNOSIS — Z79899 Other long term (current) drug therapy: Secondary | ICD-10-CM | POA: Insufficient documentation

## 2019-09-12 DIAGNOSIS — I4891 Unspecified atrial fibrillation: Secondary | ICD-10-CM | POA: Insufficient documentation

## 2019-09-12 DIAGNOSIS — Z87891 Personal history of nicotine dependence: Secondary | ICD-10-CM | POA: Insufficient documentation

## 2019-09-12 DIAGNOSIS — M1710 Unilateral primary osteoarthritis, unspecified knee: Secondary | ICD-10-CM | POA: Diagnosis present

## 2019-09-12 DIAGNOSIS — Z7901 Long term (current) use of anticoagulants: Secondary | ICD-10-CM | POA: Insufficient documentation

## 2019-09-12 DIAGNOSIS — I110 Hypertensive heart disease with heart failure: Secondary | ICD-10-CM | POA: Insufficient documentation

## 2019-09-12 DIAGNOSIS — M179 Osteoarthritis of knee, unspecified: Secondary | ICD-10-CM

## 2019-09-12 DIAGNOSIS — E669 Obesity, unspecified: Secondary | ICD-10-CM | POA: Insufficient documentation

## 2019-09-12 DIAGNOSIS — M1712 Unilateral primary osteoarthritis, left knee: Secondary | ICD-10-CM | POA: Insufficient documentation

## 2019-09-12 HISTORY — PX: TOTAL KNEE ARTHROPLASTY: SHX125

## 2019-09-12 LAB — TYPE AND SCREEN
ABO/RH(D): O POS
Antibody Screen: NEGATIVE

## 2019-09-12 SURGERY — ARTHROPLASTY, KNEE, TOTAL
Anesthesia: General | Site: Knee | Laterality: Left

## 2019-09-12 MED ORDER — FENTANYL CITRATE (PF) 100 MCG/2ML IJ SOLN
INTRAMUSCULAR | Status: AC
  Filled 2019-09-12: qty 2

## 2019-09-12 MED ORDER — FLEET ENEMA 7-19 GM/118ML RE ENEM: 1.0000 | ENEMA | Freq: Once | RECTAL | Status: DC | PRN

## 2019-09-12 MED ORDER — APIXABAN 2.5 MG PO TABS
2.5000 mg | ORAL_TABLET | Freq: Two times a day (BID) | ORAL | Status: DC
  Administered 2019-09-13: 08:00:00 2.5 mg via ORAL
  Filled 2019-09-12: qty 1

## 2019-09-12 MED ORDER — ACETAMINOPHEN 500 MG PO TABS
1000.0000 mg | ORAL_TABLET | Freq: Four times a day (QID) | ORAL | Status: DC
  Administered 2019-09-12 – 2019-09-13 (×3): 1000 mg via ORAL
  Filled 2019-09-12 (×2): qty 2

## 2019-09-12 MED ORDER — METOCLOPRAMIDE HCL 5 MG PO TABS: 5.0000 mg | ORAL_TABLET | Freq: Three times a day (TID) | ORAL | Status: DC | PRN

## 2019-09-12 MED ORDER — EPHEDRINE 5 MG/ML INJ
INTRAVENOUS | Status: AC
  Filled 2019-09-12: qty 10

## 2019-09-12 MED ORDER — OXYCODONE HCL 5 MG/5ML PO SOLN: 5.0000 mg | Freq: Once | ORAL | Status: DC | PRN

## 2019-09-12 MED ORDER — OXYCODONE HCL 5 MG PO TABS
5.0000 mg | ORAL_TABLET | ORAL | Status: DC | PRN
  Administered 2019-09-12 – 2019-09-13 (×5): 10 mg via ORAL
  Filled 2019-09-12 (×5): qty 2

## 2019-09-12 MED ORDER — VANCOMYCIN HCL IN DEXTROSE 1-5 GM/200ML-% IV SOLN
1000.0000 mg | INTRAVENOUS | Status: AC
  Administered 2019-09-12: 10:00:00 1000 mg via INTRAVENOUS
  Filled 2019-09-12: qty 200

## 2019-09-12 MED ORDER — SODIUM CHLORIDE 0.9 % IR SOLN
Status: DC | PRN
  Administered 2019-09-12: 1000 mL

## 2019-09-12 MED ORDER — SODIUM CHLORIDE (PF) 0.9 % IJ SOLN
INTRAMUSCULAR | Status: AC
  Filled 2019-09-12: qty 50

## 2019-09-12 MED ORDER — PHENOL 1.4 % MT LIQD: 1.0000 | OROMUCOSAL | Status: DC | PRN

## 2019-09-12 MED ORDER — PROPOFOL 10 MG/ML IV BOLUS
INTRAVENOUS | Status: AC
  Filled 2019-09-12: qty 40

## 2019-09-12 MED ORDER — DOCUSATE SODIUM 100 MG PO CAPS
100.0000 mg | ORAL_CAPSULE | Freq: Two times a day (BID) | ORAL | Status: DC
  Administered 2019-09-12 – 2019-09-13 (×2): 100 mg via ORAL
  Filled 2019-09-12 (×2): qty 1

## 2019-09-12 MED ORDER — METHOCARBAMOL 500 MG IVPB - SIMPLE MED
INTRAVENOUS | Status: AC
  Filled 2019-09-12: qty 50

## 2019-09-12 MED ORDER — POLYETHYLENE GLYCOL 3350 17 G PO PACK: 17.0000 g | PACK | Freq: Every day | ORAL | Status: DC | PRN

## 2019-09-12 MED ORDER — GABAPENTIN 300 MG PO CAPS
300.0000 mg | ORAL_CAPSULE | Freq: Three times a day (TID) | ORAL | Status: DC
  Administered 2019-09-12 – 2019-09-13 (×3): 300 mg via ORAL
  Filled 2019-09-12 (×3): qty 1

## 2019-09-12 MED ORDER — OXYCODONE HCL 5 MG PO TABS: 5.0000 mg | ORAL_TABLET | Freq: Once | ORAL | Status: DC | PRN

## 2019-09-12 MED ORDER — PROPOFOL 10 MG/ML IV BOLUS
INTRAVENOUS | Status: AC
  Filled 2019-09-12: qty 20

## 2019-09-12 MED ORDER — LACTATED RINGERS IV SOLN: INTRAVENOUS | Status: DC

## 2019-09-12 MED ORDER — DEXAMETHASONE SODIUM PHOSPHATE 10 MG/ML IJ SOLN
INTRAMUSCULAR | Status: AC
  Filled 2019-09-12: qty 1

## 2019-09-12 MED ORDER — VANCOMYCIN HCL IN DEXTROSE 1-5 GM/200ML-% IV SOLN
1000.0000 mg | Freq: Two times a day (BID) | INTRAVENOUS | Status: AC
  Administered 2019-09-12: 21:00:00 1000 mg via INTRAVENOUS
  Filled 2019-09-12: qty 200

## 2019-09-12 MED ORDER — EPHEDRINE SULFATE-NACL 50-0.9 MG/10ML-% IV SOSY
PREFILLED_SYRINGE | INTRAVENOUS | Status: DC | PRN
  Administered 2019-09-12 (×2): 7.5 mg via INTRAVENOUS

## 2019-09-12 MED ORDER — CHLORHEXIDINE GLUCONATE 4 % EX LIQD: 60.0000 mL | Freq: Once | CUTANEOUS | Status: DC

## 2019-09-12 MED ORDER — METOCLOPRAMIDE HCL 5 MG/ML IJ SOLN: 5.0000 mg | Freq: Three times a day (TID) | INTRAMUSCULAR | Status: DC | PRN

## 2019-09-12 MED ORDER — TRANEXAMIC ACID-NACL 1000-0.7 MG/100ML-% IV SOLN
1000.0000 mg | INTRAVENOUS | Status: AC
  Administered 2019-09-12: 1000 mg via INTRAVENOUS
  Filled 2019-09-12: qty 100

## 2019-09-12 MED ORDER — METHOCARBAMOL 500 MG IVPB - SIMPLE MED
500.0000 mg | Freq: Four times a day (QID) | INTRAVENOUS | Status: DC | PRN
  Administered 2019-09-12: 500 mg via INTRAVENOUS
  Filled 2019-09-12: qty 50

## 2019-09-12 MED ORDER — PROPOFOL 10 MG/ML IV BOLUS
INTRAVENOUS | Status: DC | PRN
  Administered 2019-09-12: 150 mg via INTRAVENOUS

## 2019-09-12 MED ORDER — ONDANSETRON HCL 4 MG/2ML IJ SOLN
INTRAMUSCULAR | Status: DC | PRN
  Administered 2019-09-12: 4 mg via INTRAVENOUS

## 2019-09-12 MED ORDER — DEXAMETHASONE SODIUM PHOSPHATE 10 MG/ML IJ SOLN
10.0000 mg | Freq: Once | INTRAMUSCULAR | Status: AC
  Administered 2019-09-13: 10 mg via INTRAVENOUS
  Filled 2019-09-12: qty 1

## 2019-09-12 MED ORDER — BISACODYL 10 MG RE SUPP: 10.0000 mg | Freq: Every day | RECTAL | Status: DC | PRN

## 2019-09-12 MED ORDER — SODIUM CHLORIDE 0.9 % IV SOLN
INTRAVENOUS | Status: DC
  Administered 2019-09-12 (×2): via INTRAVENOUS

## 2019-09-12 MED ORDER — ONDANSETRON HCL 4 MG/2ML IJ SOLN
INTRAMUSCULAR | Status: AC
  Filled 2019-09-12: qty 2

## 2019-09-12 MED ORDER — METHOCARBAMOL 500 MG PO TABS
500.0000 mg | ORAL_TABLET | Freq: Four times a day (QID) | ORAL | Status: DC | PRN
  Administered 2019-09-12 – 2019-09-13 (×3): 500 mg via ORAL
  Filled 2019-09-12 (×2): qty 1

## 2019-09-12 MED ORDER — SODIUM CHLORIDE (PF) 0.9 % IJ SOLN
INTRAMUSCULAR | Status: AC
  Filled 2019-09-12: qty 10

## 2019-09-12 MED ORDER — DIPHENHYDRAMINE HCL 12.5 MG/5ML PO ELIX: 12.5000 mg | ORAL_SOLUTION | ORAL | Status: DC | PRN

## 2019-09-12 MED ORDER — ROPIVACAINE HCL 5 MG/ML IJ SOLN
INTRAMUSCULAR | Status: DC | PRN
  Administered 2019-09-12: 20 mL via PERINEURAL

## 2019-09-12 MED ORDER — FENTANYL CITRATE (PF) 100 MCG/2ML IJ SOLN
INTRAMUSCULAR | Status: DC | PRN
  Administered 2019-09-12 (×4): 50 ug via INTRAVENOUS

## 2019-09-12 MED ORDER — ONDANSETRON HCL 4 MG/2ML IJ SOLN: 4.0000 mg | Freq: Four times a day (QID) | INTRAMUSCULAR | Status: DC | PRN

## 2019-09-12 MED ORDER — BUPIVACAINE LIPOSOME 1.3 % IJ SUSP
INTRAMUSCULAR | Status: DC | PRN
  Administered 2019-09-12: 20 mL

## 2019-09-12 MED ORDER — DEXAMETHASONE SODIUM PHOSPHATE 10 MG/ML IJ SOLN
8.0000 mg | Freq: Once | INTRAMUSCULAR | Status: AC
  Administered 2019-09-12: 10:00:00 5 mg via INTRAVENOUS

## 2019-09-12 MED ORDER — FENTANYL CITRATE (PF) 100 MCG/2ML IJ SOLN
25.0000 ug | INTRAMUSCULAR | Status: DC | PRN
  Administered 2019-09-12 (×2): 50 ug via INTRAVENOUS

## 2019-09-12 MED ORDER — POVIDONE-IODINE 10 % EX SWAB
2.0000 "application " | Freq: Once | CUTANEOUS | Status: AC
  Administered 2019-09-12: 2 via TOPICAL

## 2019-09-12 MED ORDER — MORPHINE SULFATE (PF) 2 MG/ML IV SOLN
1.0000 mg | INTRAVENOUS | Status: DC | PRN
  Administered 2019-09-12: 21:00:00 1 mg via INTRAVENOUS
  Filled 2019-09-12: qty 1

## 2019-09-12 MED ORDER — POLYMYXIN B-TRIMETHOPRIM 10000-0.1 UNIT/ML-% OP SOLN: 1.0000 [drp] | OPHTHALMIC | Status: DC

## 2019-09-12 MED ORDER — ONDANSETRON HCL 4 MG PO TABS: 4.0000 mg | ORAL_TABLET | Freq: Four times a day (QID) | ORAL | Status: DC | PRN

## 2019-09-12 MED ORDER — FENTANYL CITRATE (PF) 100 MCG/2ML IJ SOLN
50.0000 ug | INTRAMUSCULAR | Status: DC
  Administered 2019-09-12: 10:00:00 50 ug via INTRAVENOUS
  Filled 2019-09-12: qty 2

## 2019-09-12 MED ORDER — ACETAMINOPHEN 10 MG/ML IV SOLN
1000.0000 mg | Freq: Four times a day (QID) | INTRAVENOUS | Status: AC
  Administered 2019-09-12 (×2): 1000 mg via INTRAVENOUS
  Filled 2019-09-12 (×4): qty 100

## 2019-09-12 MED ORDER — LIDOCAINE 2% (20 MG/ML) 5 ML SYRINGE
INTRAMUSCULAR | Status: AC
  Filled 2019-09-12: qty 5

## 2019-09-12 MED ORDER — MENTHOL 3 MG MT LOZG: 1.0000 | LOZENGE | OROMUCOSAL | Status: DC | PRN

## 2019-09-12 MED ORDER — LACTATED RINGERS IV SOLN
INTRAVENOUS | Status: DC
  Administered 2019-09-12: 09:00:00 via INTRAVENOUS

## 2019-09-12 MED ORDER — DILTIAZEM HCL ER COATED BEADS 240 MG PO CP24
240.0000 mg | ORAL_CAPSULE | Freq: Every day | ORAL | Status: DC
  Administered 2019-09-13: 240 mg via ORAL
  Filled 2019-09-12: qty 1

## 2019-09-12 MED ORDER — OXYCODONE HCL 5 MG PO TABS
10.0000 mg | ORAL_TABLET | ORAL | Status: DC | PRN
  Administered 2019-09-12: 10 mg via ORAL
  Filled 2019-09-12: qty 2

## 2019-09-12 MED ORDER — LIDOCAINE 2% (20 MG/ML) 5 ML SYRINGE
INTRAMUSCULAR | Status: DC | PRN
  Administered 2019-09-12: 80 mg via INTRAVENOUS

## 2019-09-12 MED ORDER — SODIUM CHLORIDE (PF) 0.9 % IJ SOLN
INTRAMUSCULAR | Status: DC | PRN
  Administered 2019-09-12: 60 mL

## 2019-09-12 SURGICAL SUPPLY — 55 items
ATTUNE MED DOME PAT 41 KNEE (Knees) ×1 IMPLANT
ATTUNE MED DOME PAT 41MM KNEE (Knees) ×1 IMPLANT
ATTUNE PS FEM LT SZ 8 CEM KNEE (Femur) ×2 IMPLANT
ATTUNE PSRP INSR SZ8 7 KNEE (Insert) ×1 IMPLANT
ATTUNE PSRP INSR SZ8 7MM KNEE (Insert) ×1 IMPLANT
BAG ZIPLOCK 12X15 (MISCELLANEOUS) ×3 IMPLANT
BASE TIBIAL ROT PLAT SZ 7 KNEE (Knees) IMPLANT
BLADE SAG 18X100X1.27 (BLADE) ×3 IMPLANT
BLADE SAW SGTL 11.0X1.19X90.0M (BLADE) ×3 IMPLANT
BNDG ELASTIC 6X5.8 VLCR STR LF (GAUZE/BANDAGES/DRESSINGS) ×3 IMPLANT
BOWL SMART MIX CTS (DISPOSABLE) ×3 IMPLANT
CEMENT HV SMART SET (Cement) ×6 IMPLANT
CLOSURE WOUND 1/2 X4 (GAUZE/BANDAGES/DRESSINGS) ×2
COVER SURGICAL LIGHT HANDLE (MISCELLANEOUS) ×3 IMPLANT
COVER WAND RF STERILE (DRAPES) ×2 IMPLANT
CUFF TOURN SGL QUICK 34 (TOURNIQUET CUFF) ×2
CUFF TRNQT CYL 34X4.125X (TOURNIQUET CUFF) ×1 IMPLANT
DECANTER SPIKE VIAL GLASS SM (MISCELLANEOUS) ×3 IMPLANT
DRAPE U-SHAPE 47X51 STRL (DRAPES) ×3 IMPLANT
DRSG ADAPTIC 3X8 NADH LF (GAUZE/BANDAGES/DRESSINGS) ×3 IMPLANT
DRSG PAD ABDOMINAL 8X10 ST (GAUZE/BANDAGES/DRESSINGS) ×3 IMPLANT
DURAPREP 26ML APPLICATOR (WOUND CARE) ×3 IMPLANT
ELECT REM PT RETURN 15FT ADLT (MISCELLANEOUS) ×3 IMPLANT
EVACUATOR 1/8 PVC DRAIN (DRAIN) ×3 IMPLANT
GAUZE SPONGE 4X4 12PLY STRL (GAUZE/BANDAGES/DRESSINGS) ×3 IMPLANT
GLOVE BIO SURGEON STRL SZ8 (GLOVE) ×3 IMPLANT
GLOVE BIOGEL PI IND STRL 6.5 (GLOVE) ×1 IMPLANT
GLOVE BIOGEL PI IND STRL 8 (GLOVE) ×1 IMPLANT
GLOVE BIOGEL PI INDICATOR 6.5 (GLOVE) ×2
GLOVE BIOGEL PI INDICATOR 8 (GLOVE) ×2
GLOVE SURG SS PI 6.5 STRL IVOR (GLOVE) ×3 IMPLANT
GOWN STRL REUS W/TWL LRG LVL3 (GOWN DISPOSABLE) ×7 IMPLANT
HANDPIECE INTERPULSE COAX TIP (DISPOSABLE) ×2
HOLDER FOLEY CATH W/STRAP (MISCELLANEOUS) ×2 IMPLANT
IMMOBILIZER KNEE 20 (SOFTGOODS) ×3
IMMOBILIZER KNEE 20 THIGH 36 (SOFTGOODS) ×1 IMPLANT
KIT TURNOVER KIT A (KITS) IMPLANT
MANIFOLD NEPTUNE II (INSTRUMENTS) ×3 IMPLANT
PACK TOTAL KNEE CUSTOM (KITS) ×3 IMPLANT
PADDING CAST COTTON 6X4 STRL (CAST SUPPLIES) ×9 IMPLANT
PIN DRILL FIX HALF THREAD (BIT) ×2 IMPLANT
PIN STEINMAN FIXATION KNEE (PIN) ×2 IMPLANT
PROTECTOR NERVE ULNAR (MISCELLANEOUS) ×3 IMPLANT
SET HNDPC FAN SPRY TIP SCT (DISPOSABLE) ×1 IMPLANT
STRIP CLOSURE SKIN 1/2X4 (GAUZE/BANDAGES/DRESSINGS) ×4 IMPLANT
SUT MNCRL AB 4-0 PS2 18 (SUTURE) ×3 IMPLANT
SUT STRATAFIX 0 PDS 27 VIOLET (SUTURE) ×6
SUT VIC AB 2-0 CT1 27 (SUTURE) ×6
SUT VIC AB 2-0 CT1 TAPERPNT 27 (SUTURE) ×3 IMPLANT
SUTURE STRATFX 0 PDS 27 VIOLET (SUTURE) ×1 IMPLANT
TIBIAL BASE ROT PLAT SZ 7 KNEE (Knees) ×3 IMPLANT
TRAY FOLEY MTR SLVR 16FR STAT (SET/KITS/TRAYS/PACK) ×3 IMPLANT
WATER STERILE IRR 1000ML POUR (IV SOLUTION) ×6 IMPLANT
WRAP KNEE MAXI GEL POST OP (GAUZE/BANDAGES/DRESSINGS) ×3 IMPLANT
YANKAUER SUCT BULB TIP 10FT TU (MISCELLANEOUS) ×3 IMPLANT

## 2019-09-12 NOTE — Anesthesia Preprocedure Evaluation (Addendum)
Anesthesia Evaluation  Patient identified by MRN, date of birth, ID band Patient awake    Reviewed: Allergy & Precautions, H&P , NPO status , Patient's Chart, lab work & pertinent test results  Airway Mallampati: II   Neck ROM: full    Dental   Pulmonary former smoker,    breath sounds clear to auscultation       Cardiovascular hypertension, + dysrhythmias Atrial Fibrillation  Rhythm:regular Rate:Normal     Neuro/Psych    GI/Hepatic   Endo/Other    Renal/GU      Musculoskeletal  (+) Arthritis ,   Abdominal   Peds  Hematology   Anesthesia Other Findings   Reproductive/Obstetrics                             Anesthesia Physical Anesthesia Plan  ASA: III  Anesthesia Plan: General   Post-op Pain Management:  Regional for Post-op pain   Induction: Intravenous  PONV Risk Score and Plan: 1 and Ondansetron, Dexamethasone and Treatment may vary due to age or medical condition  Airway Management Planned: Simple Face Mask  Additional Equipment:   Intra-op Plan:   Post-operative Plan:   Informed Consent: I have reviewed the patients History and Physical, chart, labs and discussed the procedure including the risks, benefits and alternatives for the proposed anesthesia with the patient or authorized representative who has indicated his/her understanding and acceptance.       Plan Discussed with: CRNA, Anesthesiologist and Surgeon  Anesthesia Plan Comments: (Pt took last dose of eliquis 9/18 at 8pm (<72 hrs). )       Anesthesia Quick Evaluation

## 2019-09-12 NOTE — Op Note (Signed)
OPERATIVE REPORT-TOTAL KNEE ARTHROPLASTY   Pre-operative diagnosis- Osteoarthritis  Left knee(s)  Post-operative diagnosis- Osteoarthritis Left knee(s)  Procedure-  Left  Total Knee Arthroplasty  Surgeon- Dione Plover. Jalonda Antigua, MD  Assistant- Ardeen Jourdain, PA-C   Anesthesia-  Adductor canal block and General  EBL-20 mL   Drains Hemovac  Tourniquet time-  Total Tourniquet Time Documented: Thigh (Left) - 34 minutes Total: Thigh (Left) - 34 minutes     Complications- None  Condition-PACU - hemodynamically stable.   Brief Clinical Note   Mathew Simpson is a 83 y.o. year old male with end stage OA of his left knee with progressively worsening pain and dysfunction. He has constant pain, with activity and at rest and significant functional deficits with difficulties even with ADLs. He has had extensive non-op management including analgesics, injections of cortisone and viscosupplements, and home exercise program, but remains in significant pain with significant dysfunction. Radiographs show bone on bone arthritis medial and patellofemoral with large varus deformity. He presents now for left Total Knee Arthroplasty.    Procedure in detail---   The patient is brought into the operating room and positioned supine on the operating table. After successful administration of  GA combined with regional for post-op pain,   a tourniquet is placed high on the  Left thigh(s) and the lower extremity is prepped and draped in the usual sterile fashion. Time out is performed by the operating team and then the  Left lower extremity is wrapped in Esmarch, knee flexed and the tourniquet inflated to 300 mmHg.       A midline incision is made with a ten blade through the subcutaneous tissue to the level of the extensor mechanism. A fresh blade is used to make a medial parapatellar arthrotomy. Soft tissue over the proximal medial tibia is subperiosteally elevated to the joint line with a knife and into the  semimembranosus bursa with a Cobb elevator. Soft tissue over the proximal lateral tibia is elevated with attention being paid to avoiding the patellar tendon on the tibial tubercle. The patella is everted, knee flexed 90 degrees and the ACL and PCL are removed. Findings are bone on bone medial and patellofemoral with massive global osteophytes.        The drill is used to create a starting hole in the distal femur and the canal is thoroughly irrigated with sterile saline to remove the fatty contents. The 5 degree Left  valgus alignment guide is placed into the femoral canal and the distal femoral cutting block is pinned to remove 9 mm off the distal femur. Resection is made with an oscillating saw.      The tibia is subluxed forward and the menisci are removed. The extramedullary alignment guide is placed referencing proximally at the medial aspect of the tibial tubercle and distally along the second metatarsal axis and tibial crest. The block is pinned to remove 58mm off the more deficient medial  side. Resection is made with an oscillating saw. Size 7is the most appropriate size for the tibia and the proximal tibia is prepared with the modular drill and keel punch for that size.      The femoral sizing guide is placed and size 8 is most appropriate. Rotation is marked off the epicondylar axis and confirmed by creating a rectangular flexion gap at 90 degrees. The size 8 cutting block is pinned in this rotation and the anterior, posterior and chamfer cuts are made with the oscillating saw. The intercondylar block is then  placed and that cut is made.      Trial size 7 tibial component, trial size 8 posterior stabilized femur and a 7  mm posterior stabilized rotating platform insert trial is placed. Full extension is achieved with excellent varus/valgus and anterior/posterior balance throughout full range of motion. The patella is everted and thickness measured to be 27  mm. Free hand resection is taken to 15 mm, a  41 template is placed, lug holes are drilled, trial patella is placed, and it tracks normally. Osteophytes are removed off the posterior femur with the trial in place. All trials are removed and the cut bone surfaces prepared with pulsatile lavage. Cement is mixed and once ready for implantation, the size 7 tibial implant, size  8 posterior stabilized femoral component, and the size 41 patella are cemented in place and the patella is held with the clamp. The trial insert is placed and the knee held in full extension. The Exparel (20 ml mixed with 60 ml saline) is injected into the extensor mechanism, posterior capsule, medial and lateral gutters and subcutaneous tissues.  All extruded cement is removed and once the cement is hard the permanent 7 mm posterior stabilized rotating platform insert is placed into the tibial tray.      The wound is copiously irrigated with saline solution and the extensor mechanism closed over a hemovac drain with #1 V-loc suture. The tourniquet is released for a total tourniquet time of 34  minutes. Flexion against gravity is 140 degrees and the patella tracks normally. Subcutaneous tissue is closed with 2.0 vicryl and subcuticular with running 4.0 Monocryl. The incision is cleaned and dried and steri-strips and a bulky sterile dressing are applied. The limb is placed into a knee immobilizer and the patient is awakened and transported to recovery in stable condition.      Please note that a surgical assistant was a medical necessity for this procedure in order to perform it in a safe and expeditious manner. Surgical assistant was necessary to retract the ligaments and vital neurovascular structures to prevent injury to them and also necessary for proper positioning of the limb to allow for anatomic placement of the prosthesis.   Dione Plover Tiffiney Sparrow, MD    09/12/2019, 11:20 AM

## 2019-09-12 NOTE — Interval H&P Note (Signed)
History and Physical Interval Note:  09/12/2019 8:41 AM  Mathew Simpson  has presented today for surgery, with the diagnosis of left knee osteoarthritis.  The various methods of treatment have been discussed with the patient and family. After consideration of risks, benefits and other options for treatment, the patient has consented to  Procedure(s) with comments: TOTAL KNEE ARTHROPLASTY (Left) - 31min as a surgical intervention.  The patient's history has been reviewed, patient examined, no change in status, stable for surgery.  I have reviewed the patient's chart and labs.  Questions were answered to the patient's satisfaction.     Pilar Plate Ferrel [place]

## 2019-09-12 NOTE — Plan of Care (Signed)
Continue current POC 

## 2019-09-12 NOTE — Evaluation (Signed)
Physical Therapy Evaluation Patient Details Name: Mathew Simpson MRN: [id number] DOB: 04-Dec-1936 Today's Date: 09/12/2019   History of Present Illness  Pt is 83 y.o. s/p Lt TKA with PMH significant for HTN DJD, and glaucoma.  Clinical Impression  Mathew Simpson is a 83 y.o. male POD 0 s/p Lt TKA. Patient reports modified independence with mobility at baseline using walking stick to mobilize. Patient is now limited by functional impairments (see PT problem list below) and requires min assist for transfers and gait with RW. Patient was able to ambulate ~80 feet with RW and min assist with cues for safety throughout. Patient instructed in exercise to facilitate ROM and circulation to manage edema. Patient will benefit from continued skilled PT interventions to address impairments and progress towards PLOF. Acute PT will follow to progress mobility and stair training in preparation for safe discharge home.     Follow Up Recommendations Follow surgeon's recommendation for DC plan and follow-up therapies    Equipment Recommendations  None recommended by PT(educated pt on where to purchase shower seat)    Recommendations for Other Services       Precautions / Restrictions Precautions Precautions: Fall Restrictions Weight Bearing Restrictions: No      Mobility  Bed Mobility Overal bed mobility: Needs Assistance Bed Mobility: Supine to Sit     Supine to sit: HOB elevated;Min guard     General bed mobility comments: pt using bed rails, pt required cues to scoot to EOB and put feet flat on floor, no physical assist required  Transfers Overall transfer level: Needs assistance Equipment used: Rolling walker (2 wheeled) Transfers: Sit to/from Stand Sit to Stand: Min assist         General transfer comment: cues for safe hand placement, assist required to initiate power up and to steady in standing  Ambulation/Gait Ambulation/Gait assistance: Min assist Gait Distance (Feet): 80  Feet Assistive device: Rolling walker (2 wheeled) Gait Pattern/deviations: Step-through pattern;Decreased step length - right;Decreased step length - left;Decreased stance time - left;Decreased weight shift to left;Trunk flexed Gait velocity: decreased   General Gait Details: pt with LT knee slightly flexed in WB however no buckling noted during gait, cues required throughout for reminder on hand placement with RW, cues for safe proximety to RW required throughout  [employer]    Modified Rankin (Stroke Patients Only)       Balance Overall balance assessment: Needs assistance Sitting-balance support: Feet supported;No upper extremity supported Sitting balance-Leahy Scale: Fair     Standing balance support: During functional activity;Bilateral upper extremity supported Standing balance-Leahy Scale: Poor Standing balance comment: pt requires UE support for standing balance                Pertinent Vitals/Pain Pain Assessment: Faces Faces Pain Scale: Hurts a little bit Pain Location: L knee Pain Descriptors / Indicators: Guarding Pain Intervention(s): Limited activity within patient's tolerance;Monitored during session;Ice applied    Home Living Family/patient expects to be discharged to:: Private residence Living Arrangements: Alone Available Help at Discharge: Family(pt is going to stay with daughter following surgery)   Home Access: Stairs to enter   Entrance Stairs-Number of Steps: 4 stairs at his home with bil hand rails; 4 steps at daughters home with 1 hand rail Home Layout: One level;Two level Home Equipment: Walker - 2 wheels;Cane - single point(walking stick)      Prior Function Level of Independence: Independent with assistive device(s)  Comments: pt reports he was usin a walking stick prior to surgery for ambulation     Hand Dominance        Extremity/Trunk Assessment   Upper Extremity Assessment Upper  Extremity Assessment: Generalized weakness    Lower Extremity Assessment Lower Extremity Assessment: Generalized weakness;LLE deficits/detail LLE Deficits / Details: pt with mild buckling in WB, knee appears to be in flexed position    Cervical / Trunk Assessment Cervical / Trunk Assessment: Normal  Communication   Communication: No difficulties  Cognition Arousal/Alertness: Awake/alert Behavior During Therapy: WFL for tasks assessed/performed Overall Cognitive Status: Within Functional Limits for tasks assessed            General Comments      Exercises Total Joint Exercises Ankle Circles/Pumps: AROM;Seated;Both;10 reps Quad Sets: AROM;5 reps;Supine;Seated(2 sets, 1x 5 supine, 1x 5 seated)) Heel Slides: AROM;5 reps;Seated;Left   Assessment/Plan    PT Assessment Patient needs continued PT services  PT Problem List Decreased strength;Decreased balance;Decreased knowledge of use of DME;Decreased range of motion;Decreased mobility;Decreased activity tolerance       PT Treatment Interventions DME instruction;Functional mobility training;Balance training;Patient/family education;Modalities;Gait training;Therapeutic activities;Manual techniques;Therapeutic exercise;Stair training    PT Goals (Current goals can be found in the Care Plan section)  Acute Rehab PT Goals Patient Stated Goal: to get home after recovering at his daughters PT Goal Formulation: With patient Time For Goal Achievement: 09/19/19 Potential to Achieve Goals: Good    Frequency 7X/week    AM-PAC PT "6 Clicks" Mobility  Outcome Measure Help needed turning from your back to your side while in a flat bed without using bedrails?: A Little Help needed moving from lying on your back to sitting on the side of a flat bed without using bedrails?: A Little Help needed moving to and from a bed to a chair (including a wheelchair)?: A Little Help needed standing up from a chair using your arms (e.g., wheelchair or  bedside chair)?: A Little Help needed to walk in hospital room?: A Little Help needed climbing 3-5 steps with a railing? : A Little 6 Click Score: 18    End of Session Equipment Utilized During Treatment: Gait belt Activity Tolerance: Patient tolerated treatment well Patient left: in chair;with chair alarm set;with family/visitor present Nurse Communication: Mobility status PT Visit Diagnosis: Unsteadiness on feet (R26.81);Other abnormalities of gait and mobility (R26.89);Muscle weakness (generalized) (M62.81);Difficulty in walking, not elsewhere classified (R26.2)    Time: 1520-1540 PT Time Calculation (min) (ACUTE ONLY): 20 min   Charges:   PT Evaluation $PT Eval Low Complexity: 1 Low          Kipp Brood, PT, DPT, [facility] Physical Therapist with [facility]  09/12/2019 5:06 PM

## 2019-09-12 NOTE — Anesthesia Procedure Notes (Signed)
Procedure Name: LMA Insertion Date/Time: 09/12/2019 10:23 AM Performed by: Claudia Desanctis, CRNA Pre-anesthesia Checklist: Emergency Drugs available, Patient identified, Suction available and Patient being monitored Patient Re-evaluated:Patient Re-evaluated prior to induction Oxygen Delivery Method: Circle system utilized Preoxygenation: Pre-oxygenation with 100% oxygen Induction Type: IV induction Ventilation: Mask ventilation without difficulty LMA: LMA inserted LMA Size: 4.0 Number of attempts: 1 Placement Confirmation: positive ETCO2 and breath sounds checked- equal and bilateral Tube secured with: Tape Dental Injury: Teeth and Oropharynx as per pre-operative assessment

## 2019-09-12 NOTE — Progress Notes (Signed)
AssistedDr. Marcie Bal with left, ultrasound guided, adductor canal block. Side rails up, monitors on throughout procedure. See vital signs in flow sheet. Tolerated Procedure well.

## 2019-09-12 NOTE — Anesthesia Procedure Notes (Signed)
Anesthesia Regional Block: Adductor canal block   Pre-Anesthetic Checklist: ,, timeout performed, Correct Patient, Correct Site, Correct Laterality, Correct Procedure, Correct Position, site marked, Risks and benefits discussed,  Surgical consent,  Pre-op evaluation,  At surgeon's request and post-op pain management  Laterality: Left  Prep: chloraprep       Needles:  Injection technique: Single-shot  Needle Type: Echogenic Needle     Needle Length: 9cm  Needle Gauge: 21     Additional Needles:   Narrative:  Start time: 09/12/2019 9:57 AM End time: 09/12/2019 10:06 AM Injection made incrementally with aspirations every 5 mL.  Performed by: Personally  Anesthesiologist: Albertha Ghee, MD  Additional Notes: Pt tolerated the procedure well.

## 2019-09-12 NOTE — Discharge Instructions (Signed)
° °Dr. Frank Aluisio °Total Joint Specialist °[facility] °[address] °[place], [place] [address] °[phone] ° °TOTAL KNEE REPLACEMENT POSTOPERATIVE DIRECTIONS ° °Knee Rehabilitation, Guidelines Following Surgery  °Results after knee surgery are often greatly improved when you follow the exercise, range of motion and muscle strengthening exercises prescribed by your doctor. Safety measures are also important to protect the knee from further injury. Any time any of these exercises cause you to have increased pain or swelling in your knee joint, decrease the amount until you are comfortable again and slowly increase them. If you have problems or questions, call your caregiver or physical therapist for advice.  ° °HOME CARE INSTRUCTIONS  °• Remove items at home which could result in a fall. This includes throw rugs or furniture in walking pathways.  °· ICE to the affected knee every three hours for 30 minutes at a time and then as needed for pain and swelling.  Continue to use ice on the knee for pain and swelling from surgery. You may notice swelling that will progress down to the foot and ankle.  This is normal after surgery.  Elevate the leg when you are not up walking on it.   °· Continue to use the breathing machine which will help keep your temperature down.  It is common for your temperature to cycle up and down following surgery, especially at night when you are not up moving around and exerting yourself.  The breathing machine keeps your lungs expanded and your temperature down. °· Do not place pillow under knee, focus on keeping the knee straight while resting ° °DIET °You may resume your previous home diet once your are discharged from the hospital. ° °DRESSING / WOUND CARE / SHOWERING °You may shower 3 days after surgery, but keep the wounds dry during showering.  You may use an occlusive plastic wrap (Press'n Seal for example), NO SOAKING/SUBMERGING IN THE BATHTUB.  If the bandage  gets wet, change with a clean dry gauze.  If the incision gets wet, pat the wound dry with a clean towel. °You may start showering once you are discharged home but do not submerge the incision under water. Just pat the incision dry and apply a dry gauze dressing on daily. °Change the surgical dressing daily and reapply a dry dressing each time. ° °ACTIVITY °Walk with your walker as instructed. °Use walker as long as suggested by your caregivers. °Avoid periods of inactivity such as sitting longer than an hour when not asleep. This helps prevent blood clots.  °You may resume a sexual relationship in one month or when given the OK by your doctor.  °You may return to work once you are cleared by your doctor.  °Do not drive a car for 6 weeks or until released by you surgeon.  °Do not drive while taking narcotics. ° °WEIGHT BEARING °Weight bearing as tolerated with assist device (walker, cane, etc) as directed, use it as long as suggested by your surgeon or therapist, typically at least 4-6 weeks. ° °POSTOPERATIVE CONSTIPATION PROTOCOL °Constipation - defined medically as fewer than three stools per week and severe constipation as less than one stool per week. ° °One of the most common issues patients have following surgery is constipation.  Even if you have a regular bowel pattern at home, your normal regimen is likely to be disrupted due to multiple reasons following surgery.  Combination of anesthesia, postoperative narcotics, change in appetite and fluid intake all can affect your bowels.    In order to avoid complications following surgery, here are some recommendations in order to help you during your recovery period. ° °Colace (docusate) - Pick up an over-the-counter form of Colace or another stool softener and take twice a day as long as you are requiring postoperative pain medications.  Take with a full glass of water daily.  If you experience loose stools or diarrhea, hold the colace until you stool forms back  up.  If your symptoms do not get better within 1 week or if they get worse, check with your doctor. ° °Dulcolax (bisacodyl) - Pick up over-the-counter and take as directed by the product packaging as needed to assist with the movement of your bowels.  Take with a full glass of water.  Use this product as needed if not relieved by Colace only.  ° °MiraLax (polyethylene glycol) - Pick up over-the-counter to have on hand.  MiraLax is a solution that will increase the amount of water in your bowels to assist with bowel movements.  Take as directed and can mix with a glass of water, juice, soda, coffee, or tea.  Take if you go more than two days without a movement. °Do not use MiraLax more than once per day. Call your doctor if you are still constipated or irregular after using this medication for 7 days in a row. ° °If you continue to have problems with postoperative constipation, please contact the office for further assistance and recommendations.  If you experience "the worst abdominal pain ever" or develop nausea or vomiting, please contact the office immediatly for further recommendations for treatment. ° °ITCHING ° If you experience itching with your medications, try taking only a single pain pill, or even half a pain pill at a time.  You can also use Benadryl over the counter for itching or also to help with sleep.  ° °TED HOSE STOCKINGS °Wear the elastic stockings on both legs for three weeks following surgery during the day but you may remove then at night for sleeping. ° °MEDICATIONS °See your medication summary on the “After Visit Summary” that the nursing staff will review with you prior to discharge.  You may have some home medications which will be placed on hold until you complete the course of blood thinner medication.  It is important for you to complete the blood thinner medication as prescribed by your surgeon.  Continue your approved medications as instructed at time of discharge. ° °PRECAUTIONS °If  you experience chest pain or shortness of breath - call 911 immediately for transfer to the hospital emergency department.  °If you develop a fever greater that 101 F, purulent drainage from wound, increased redness or drainage from wound, foul odor from the wound/dressing, or calf pain - CONTACT YOUR SURGEON.   °                                                °FOLLOW-UP APPOINTMENTS °Make sure you keep all of your appointments after your operation with your surgeon and caregivers. You should call the office at the above phone number and make an appointment for approximately two weeks after the date of your surgery or on the date instructed by your surgeon outlined in the "After Visit Summary". ° ° °RANGE OF MOTION AND STRENGTHENING EXERCISES  °Rehabilitation of the knee is important following a knee injury or   an operation. After just a few days of immobilization, the muscles of the thigh which control the knee become weakened and shrink (atrophy). Knee exercises are designed to build up the tone and strength of the thigh muscles and to improve knee motion. Often times heat used for twenty to thirty minutes before working out will loosen up your tissues and help with improving the range of motion but do not use heat for the first two weeks following surgery. These exercises can be done on a training (exercise) mat, on the floor, on a table or on a bed. Use what ever works the best and is most comfortable for you Knee exercises include:  °• Leg Lifts - While your knee is still immobilized in a splint or cast, you can do straight leg raises. Lift the leg to 60 degrees, hold for 3 sec, and slowly lower the leg. Repeat 10-20 times 2-3 times daily. Perform this exercise against resistance later as your knee gets better.  °• Quad and Hamstring Sets - Tighten up the muscle on the front of the thigh (Quad) and hold for 5-10 sec. Repeat this 10-20 times hourly. Hamstring sets are done by pushing the foot backward against an  object and holding for 5-10 sec. Repeat as with quad sets.  °· Leg Slides: Lying on your back, slowly slide your foot toward your buttocks, bending your knee up off the floor (only go as far as is comfortable). Then slowly slide your foot back down until your leg is flat on the floor again. °· Angel Wings: Lying on your back spread your legs to the side as far apart as you can without causing discomfort.  °A rehabilitation program following serious knee injuries can speed recovery and prevent re-injury in the future due to weakened muscles. Contact your doctor or a physical therapist for more information on knee rehabilitation.  ° °IF YOU ARE TRANSFERRED TO A SKILLED REHAB FACILITY °If the patient is transferred to a skilled rehab facility following release from the hospital, a list of the current medications will be sent to the facility for the patient to continue.  When discharged from the skilled rehab facility, please have the facility set up the patient's Home Health Physical Therapy prior to being released. Also, the skilled facility will be responsible for providing the patient with their medications at time of release from the facility to include their pain medication, the muscle relaxants, and their blood thinner medication. If the patient is still at the rehab facility at time of the two week follow up appointment, the skilled rehab facility will also need to assist the patient in arranging follow up appointment in our office and any transportation needs. ° °MAKE SURE YOU:  °• Understand these instructions.  °• Get help right away if you are not doing well or get worse.  ° ° °Pick up stool softner and laxative for home use following surgery while on pain medications. °Do not submerge incision under water. °Please use good hand washing techniques while changing dressing each day. °May shower starting three days after surgery. °Please use a clean towel to pat the incision dry following showers. °Continue to  use ice for pain and swelling after surgery. °Do not use any lotions or creams on the incision until instructed by your surgeon. ° °

## 2019-09-12 NOTE — Transfer of Care (Signed)
Immediate Anesthesia Transfer of Care Note  Patient: Mathew Simpson  Procedure(s) Performed: TOTAL KNEE ARTHROPLASTY (Left Knee)  Patient Location: PACU  Anesthesia Type:General  Level of Consciousness: awake and patient cooperative  Airway & Oxygen Therapy: Patient Spontanous Breathing and Patient connected to face mask  Post-op Assessment: Report given to RN and Post -op Vital signs reviewed and stable  Post vital signs: Reviewed and stable  Last Vitals:  Vitals Value Taken Time  BP    Temp    Pulse 94 09/12/19 1140  Resp 15 09/12/19 1140  SpO2 100 % 09/12/19 1140  Vitals shown include unvalidated device data.  Last Pain:  Vitals:   09/12/19 0843  TempSrc:   PainSc: 3       Patients Stated Pain Goal: 4 ([id number])  Complications: No apparent anesthesia complications

## 2019-09-13 ENCOUNTER — Encounter (HOSPITAL_COMMUNITY): Payer: Self-pay | Admitting: Orthopedic Surgery

## 2019-09-13 LAB — BASIC METABOLIC PANEL
Anion gap: 7 (ref 5–15)
BUN: 21 mg/dL (ref 8–23)
CO2: 26 mmol/L (ref 22–32)
Calcium: 8.4 mg/dL — ABNORMAL LOW (ref 8.9–10.3)
Chloride: 103 mmol/L (ref 98–111)
Creatinine, Ser: 0.77 mg/dL (ref 0.61–1.24)
GFR calc Af Amer: >60 mL/min (ref >60)
GFR calc non Af Amer: >60 mL/min (ref >60)
Glucose, Bld: 187 mg/dL — ABNORMAL HIGH (ref 70–99)
Potassium: 4.2 mmol/L (ref 3.5–5.1)
Sodium: 136 mmol/L (ref 135–145)

## 2019-09-13 LAB — CBC
HCT: 34.3 % — ABNORMAL LOW (ref 39.0–52.0)
Hemoglobin: 11.2 g/dL — ABNORMAL LOW (ref 13.0–17.0)
MCH: 30.4 pg (ref 26.0–34.0)
MCHC: 32.7 g/dL (ref 30.0–36.0)
MCV: 93.2 fL (ref 80.0–100.0)
Platelets: 181 10*3/uL (ref 150–400)
RBC: 3.68 MIL/uL — ABNORMAL LOW (ref 4.22–5.81)
RDW: 13.1 % (ref 11.5–15.5)
WBC: 10.7 10*3/uL — ABNORMAL HIGH (ref 4.0–10.5)
nRBC: 0 % (ref 0.0–0.2)

## 2019-09-13 MED ORDER — OXYCODONE HCL 5 MG PO TABS: 5.0000 mg | ORAL_TABLET | Freq: Four times a day (QID) | ORAL | 0 refills | Status: DC | PRN

## 2019-09-13 MED ORDER — METHOCARBAMOL 500 MG PO TABS: 500.0000 mg | ORAL_TABLET | Freq: Four times a day (QID) | ORAL | 0 refills | Status: DC | PRN

## 2019-09-13 NOTE — Progress Notes (Signed)
Physical Therapy Treatment Patient Details Name: Mathew Simpson MRN: [id number] DOB: October 05, 1936 Today's Date: 09/13/2019    History of Present Illness Pt is 83 y.o. s/p Lt TKA.    PT Comments    2nd session to continue gait and stair training. Issued HEP for pt to perform 2-3x/day until he begins OP PT. All education completed. Okay to d/c from PT standpoint.    Follow Up Recommendations  Follow surgeon's recommendation for DC plan and follow-up therapies     Equipment Recommendations  None recommended by PT    Recommendations for Other Services       Precautions / Restrictions Precautions Precautions: Fall Restrictions Weight Bearing Restrictions: No Other Position/Activity Restrictions: WBAT    Mobility  Bed Mobility               General bed mobility comments: oob in recliner  Transfers Overall transfer level: Needs assistance Equipment used: Rolling walker (2 wheeled) Transfers: Sit to/from Stand Sit to Stand: Min assist         General transfer comment: Assist to steady. VCs safety, hand placement.  Ambulation/Gait Ambulation/Gait assistance: Min assist Gait Distance (Feet): 100 Feet Assistive device: Rolling walker (2 wheeled) Gait Pattern/deviations: Step-to pattern;Trunk flexed     General Gait Details: Pt still has a fair amount of knee flexion in stance. Intermittent assist to steady.   Stairs Stairs: Yes Stairs assistance: Min assist Stair Management: Step to pattern;Forwards;Two rails;One rail Right Number of Stairs: 3(3x1, 2x1) General stair comments: up and over portable steps x 2. Once using 2 rails, then once using 1 rail. VCs safety, technique, sequence   Wheelchair Mobility    Modified Rankin (Stroke Patients Only)       Balance Overall balance assessment: Needs assistance         Standing balance support: Bilateral upper extremity supported Standing balance-Leahy Scale: Poor                               Cognition Arousal/Alertness: Awake/alert Behavior During Therapy: WFL for tasks assessed/performed Overall Cognitive Status: Within Functional Limits for tasks assessed                                        Exercises Total Joint Exercises Ankle Circles/Pumps: AROM;Both;10 reps;Seated Quad Sets: AROM;Both;10 reps;Seated Heel Slides: AAROM;Left;5 reps;Supine Hip ABduction/ADduction: AROM;Left;10 reps;Seated Straight Leg Raises: AROM;Left;10 reps;Supine Knee Flexion: AAROM;Left;5 reps;Seated Goniometric ROM: ~15-70 degrees    General Comments        Pertinent Vitals/Pain Pain Assessment: 0-10 Pain Score: 6  Pain Location: L knee Pain Descriptors / Indicators: Aching;Sore Pain Intervention(s): Monitored during session;Limited activity within patient's tolerance    Home Living                      Prior Function            PT Goals (current goals can now be found in the care plan section) Progress towards PT goals: Progressing toward goals    Frequency    7X/week      PT Plan Current plan remains appropriate    Co-evaluation              AM-PAC PT "6 Clicks" Mobility   Outcome Measure  Help needed turning from your back to your side while in a flat  bed without using bedrails?: A Little Help needed moving from lying on your back to sitting on the side of a flat bed without using bedrails?: A Little Help needed moving to and from a bed to a chair (including a wheelchair)?: A Little Help needed standing up from a chair using your arms (e.g., wheelchair or bedside chair)?: A Little Help needed to walk in hospital room?: A Little Help needed climbing 3-5 steps with a railing? : A Little 6 Click Score: 18    End of Session Equipment Utilized During Treatment: Gait belt Activity Tolerance: Patient tolerated treatment well Patient left: in chair;with call bell/phone within reach;with chair alarm set   PT Visit Diagnosis:  Unsteadiness on feet (R26.81);Other abnormalities of gait and mobility (R26.89);Muscle weakness (generalized) (M62.81);Difficulty in walking, not elsewhere classified (R26.2)     Time: [id number] PT Time Calculation (min) (ACUTE ONLY): 11 min  Charges:  $Gait Training: 8-22 mins $Therapeutic Exercise: 8-22 mins                       Weston Anna, PT Acute Rehabilitation Services Pager: [phone] Office: [phone]

## 2019-09-13 NOTE — Progress Notes (Signed)
   Subjective: 1 Day Post-Op Procedure(s) (LRB): TOTAL KNEE ARTHROPLASTY (Left) Patient reports pain as mild.   Patient seen in rounds by Dr. Wynelle Link. Patient is well, and has had no acute complaints or problems other than discomfort in the left knee. No acute events overnight. Patient states he is ready to go home today.  We will continue therapy today.   Objective: Vital signs in last 24 hours: Temp:  [97.5 F (36.4 C)-98.6 F (37 C)] 97.7 F (36.5 C) (09/22 0452) Pulse Rate:  [46-99] 75 (09/22 0452) Resp:  [11-28] 16 (09/22 0452) BP: (109-145)/(71-104) 145/97 (09/22 0452) SpO2:  [94 %-100 %] 100 % (09/22 0452) Weight:  [81.8 kg] 81.8 kg (09/21 0843)  Intake/Output from previous day:  Intake/Output Summary (Last 24 hours) at 09/13/2019 0804 Last data filed at 09/13/2019 0757 Gross per 24 hour  Intake 3517.03 ml  Output 3485 ml  Net 32.03 ml     Intake/Output this shift: Total I/O In: 240 [P.O.:240] Out: 100 [Urine:100]  Labs: Recent Labs    09/13/19 0246  HGB 11.2*   Recent Labs    09/13/19 0246  WBC 10.7*  RBC 3.68*  HCT 34.3*  PLT 181   Recent Labs    09/13/19 0246  NA 136  K 4.2  CL 103  CO2 26  BUN 21  CREATININE 0.77  GLUCOSE 187*  CALCIUM 8.4*   No results for input(s): LABPT, INR in the last 72 hours.  Exam: General - Patient is Alert and Oriented Extremity - Neurologically intact Sensation intact distally Intact pulses distally Dorsiflexion/Plantar flexion intact Dressing - dressing C/D/I Motor Function - intact, moving foot and toes well on exam.   Past Medical History:  Diagnosis Date  . Arthritis   . DJD (degenerative joint disease)   . Dysrhythmia   . Glaucoma   . Hypertension     Assessment/Plan: 1 Day Post-Op Procedure(s) (LRB): TOTAL KNEE ARTHROPLASTY (Left) Principal Problem:   OA (osteoarthritis) of knee  Estimated body mass index is 27.43 kg/m as calculated from the following:   Height as of this encounter: 5'  8" (1.727 m).   Weight as of this encounter: 81.8 kg. Advance diet Up with therapy D/C IV fluids   Patient's anticipated LOS is less than 2 midnights, meeting these requirements: - Lives within 1 hour of care - Has a competent adult at home to recover with post-op recover - NO history of  - Chronic pain requiring opiods  - Diabetes  - Coronary Artery Disease  - Heart failure  - Heart attack  - Stroke  - DVT/VTE  - Cardiac arrhythmia  - Respiratory Failure/COPD  - Renal failure  - Anemia  - Advanced Liver disease  DVT Prophylaxis - Eliquis Weight bearing as tolerated. D/C O2 and pulse ox and try on room air. Hemovac pulled without difficulty, will begin therapy today.  Plan is to go Home after hospital stay. Plan for discharge today following 1-2 sessions of therapy as long as he is meeting his goals. Scheduled for O[facility] at [facility]. Follow up in the office in 2 weeks with Dr. Wynelle Link.   Griffith Citron, PA-C Orthopedic Surgery 09/13/2019, 8:04 AM

## 2019-09-13 NOTE — Plan of Care (Signed)
Continue current POC 

## 2019-09-13 NOTE — Progress Notes (Signed)
Therapy Plan: OPPT @[facility].  DME: Patient has RW.

## 2019-09-13 NOTE — Progress Notes (Signed)
Physical Therapy Treatment Patient Details Name: Mathew Simpson MRN: [id number] DOB: 09-19-1936 Today's Date: 09/13/2019    History of Present Illness Pt is 83 y.o. s/p Lt TKA.    PT Comments    Progressing with mobility. Will plan to have a 2nd session prior to d/c home later today.    Follow Up Recommendations  Follow surgeon's recommendation for DC plan and follow-up therapies     Equipment Recommendations  None recommended by PT    Recommendations for Other Services       Precautions / Restrictions Precautions Precautions: Fall Restrictions Weight Bearing Restrictions: No Other Position/Activity Restrictions: WBAT    Mobility  Bed Mobility               General bed mobility comments: oob in recliner  Transfers Overall transfer level: Needs assistance Equipment used: Rolling walker (2 wheeled) Transfers: Sit to/from Stand Sit to Stand: Min assist         General transfer comment: Assist to rise, stabilize, control descent. VCs safety, hand placement.  Ambulation/Gait Ambulation/Gait assistance: Min assist Gait Distance (Feet): 115 Feet Assistive device: Rolling walker (2 wheeled) Gait Pattern/deviations: Step-to pattern;Trunk flexed     General Gait Details: Pt still has a fair amount of knee flexion in stance. Intermittent assist to stabilize.   Stairs Stairs: Yes Stairs assistance: Min assist Stair Management: Step to pattern;Forwards;Two rails;One rail Right Number of Stairs: 2 steps (x2) General stair comments: up and over portable steps x 2. Once using 2 rails, then once using 1 rail. VCs safety, technique, sequence   Wheelchair Mobility    Modified Rankin (Stroke Patients Only)       Balance Overall balance assessment: Needs assistance         Standing balance support: Bilateral upper extremity supported Standing balance-Leahy Scale: Poor                              Cognition Arousal/Alertness:  Awake/alert Behavior During Therapy: WFL for tasks assessed/performed Overall Cognitive Status: Within Functional Limits for tasks assessed                                        Exercises Total Joint Exercises Ankle Circles/Pumps: AROM;Both;10 reps;Seated Quad Sets: AROM;Both;10 reps;Seated Heel Slides: AAROM;Left;5 reps;Supine Hip ABduction/ADduction: AROM;Left;10 reps;Seated Straight Leg Raises: AROM;Left;10 reps;Supine Knee Flexion: AAROM;Left;5 reps;Seated Goniometric ROM: ~15-70 degrees    General Comments        Pertinent Vitals/Pain Pain Assessment: 0-10 Pain Score: 6  Pain Location: L knee Pain Descriptors / Indicators: Aching;Sore Pain Intervention(s): Limited activity within patient's tolerance;Ice applied;Monitored during session    Home Living                      Prior Function            PT Goals (current goals can now be found in the care plan section) Progress towards PT goals: Progressing toward goals    Frequency    7X/week      PT Plan Current plan remains appropriate    Co-evaluation              AM-PAC PT "6 Clicks" Mobility   Outcome Measure  Help needed turning from your back to your side while in a flat bed without using bedrails?: A Little Help needed  moving from lying on your back to sitting on the side of a flat bed without using bedrails?: A Little Help needed moving to and from a bed to a chair (including a wheelchair)?: A Little Help needed standing up from a chair using your arms (e.g., wheelchair or bedside chair)?: A Little Help needed to walk in hospital room?: A Little Help needed climbing 3-5 steps with a railing? : A Little 6 Click Score: 18    End of Session Equipment Utilized During Treatment: Gait belt Activity Tolerance: Patient tolerated treatment well Patient left: in chair;with call bell/phone within reach;with chair alarm set   PT Visit Diagnosis: Unsteadiness on feet  (R26.81);Other abnormalities of gait and mobility (R26.89);Muscle weakness (generalized) (M62.81);Difficulty in walking, not elsewhere classified (R26.2)     Time: [id number] PT Time Calculation (min) (ACUTE ONLY): 31 min  Charges:  $Gait Training: 8-22 mins $Therapeutic Exercise: 8-22 mins                        Weston Anna, PT Acute Rehabilitation Services Pager: [phone] Office: [phone]

## 2019-09-15 ENCOUNTER — Encounter (HOSPITAL_COMMUNITY): Payer: Self-pay | Admitting: Emergency Medicine

## 2019-09-15 ENCOUNTER — Emergency Department (HOSPITAL_COMMUNITY): Payer: Medicare HMO

## 2019-09-15 ENCOUNTER — Inpatient Hospital Stay (HOSPITAL_COMMUNITY)
Admission: EM | Admit: 2019-09-15 | Discharge: 2019-09-20 | DRG: 469 | Disposition: A | Discharge status: Skilled Nursing Facility | Payer: Medicare HMO | Attending: Internal Medicine | Admitting: Internal Medicine

## 2019-09-15 ENCOUNTER — Other Ambulatory Visit: Payer: Self-pay

## 2019-09-15 DIAGNOSIS — I4891 Unspecified atrial fibrillation: Secondary | ICD-10-CM | POA: Diagnosis present

## 2019-09-15 DIAGNOSIS — Z880 Allergy status to penicillin: Secondary | ICD-10-CM | POA: Diagnosis not present

## 2019-09-15 DIAGNOSIS — Z79899 Other long term (current) drug therapy: Secondary | ICD-10-CM | POA: Diagnosis not present

## 2019-09-15 DIAGNOSIS — T888XXA Other specified complications of surgical and medical care, not elsewhere classified, initial encounter: Secondary | ICD-10-CM | POA: Diagnosis not present

## 2019-09-15 DIAGNOSIS — I119 Hypertensive heart disease without heart failure: Secondary | ICD-10-CM | POA: Diagnosis present

## 2019-09-15 DIAGNOSIS — Z6826 Body mass index (BMI) 26.0-26.9, adult: Secondary | ICD-10-CM | POA: Diagnosis not present

## 2019-09-15 DIAGNOSIS — Z7901 Long term (current) use of anticoagulants: Secondary | ICD-10-CM

## 2019-09-15 DIAGNOSIS — M1712 Unilateral primary osteoarthritis, left knee: Principal | ICD-10-CM | POA: Diagnosis present

## 2019-09-15 DIAGNOSIS — Z20828 Contact with and (suspected) exposure to other viral communicable diseases: Secondary | ICD-10-CM | POA: Diagnosis present

## 2019-09-15 DIAGNOSIS — G8918 Other acute postprocedural pain: Secondary | ICD-10-CM | POA: Diagnosis not present

## 2019-09-15 DIAGNOSIS — Z79891 Long term (current) use of opiate analgesic: Secondary | ICD-10-CM | POA: Diagnosis not present

## 2019-09-15 DIAGNOSIS — G92 Toxic encephalopathy: Secondary | ICD-10-CM | POA: Diagnosis not present

## 2019-09-15 DIAGNOSIS — H409 Unspecified glaucoma: Secondary | ICD-10-CM | POA: Diagnosis present

## 2019-09-15 DIAGNOSIS — K5900 Constipation, unspecified: Secondary | ICD-10-CM | POA: Diagnosis not present

## 2019-09-15 DIAGNOSIS — L03116 Cellulitis of left lower limb: Secondary | ICD-10-CM

## 2019-09-15 DIAGNOSIS — T40605A Adverse effect of unspecified narcotics, initial encounter: Secondary | ICD-10-CM | POA: Diagnosis present

## 2019-09-15 DIAGNOSIS — Z87891 Personal history of nicotine dependence: Secondary | ICD-10-CM

## 2019-09-15 DIAGNOSIS — M545 Low back pain: Secondary | ICD-10-CM | POA: Diagnosis present

## 2019-09-15 DIAGNOSIS — M79604 Pain in right leg: Secondary | ICD-10-CM | POA: Diagnosis not present

## 2019-09-15 DIAGNOSIS — R627 Adult failure to thrive: Secondary | ICD-10-CM | POA: Diagnosis not present

## 2019-09-15 DIAGNOSIS — R609 Edema, unspecified: Secondary | ICD-10-CM | POA: Diagnosis not present

## 2019-09-15 DIAGNOSIS — G8929 Other chronic pain: Secondary | ICD-10-CM | POA: Diagnosis present

## 2019-09-15 DIAGNOSIS — R112 Nausea with vomiting, unspecified: Secondary | ICD-10-CM

## 2019-09-15 DIAGNOSIS — R52 Pain, unspecified: Secondary | ICD-10-CM | POA: Diagnosis not present

## 2019-09-15 DIAGNOSIS — I10 Essential (primary) hypertension: Secondary | ICD-10-CM

## 2019-09-15 DIAGNOSIS — M25062 Hemarthrosis, left knee: Secondary | ICD-10-CM | POA: Diagnosis not present

## 2019-09-15 DIAGNOSIS — H9190 Unspecified hearing loss, unspecified ear: Secondary | ICD-10-CM | POA: Diagnosis present

## 2019-09-15 DIAGNOSIS — M009 Pyogenic arthritis, unspecified: Secondary | ICD-10-CM | POA: Diagnosis present

## 2019-09-15 DIAGNOSIS — M25569 Pain in unspecified knee: Secondary | ICD-10-CM | POA: Diagnosis not present

## 2019-09-15 DIAGNOSIS — M25762 Osteophyte, left knee: Secondary | ICD-10-CM | POA: Diagnosis present

## 2019-09-15 DIAGNOSIS — D62 Acute posthemorrhagic anemia: Secondary | ICD-10-CM | POA: Diagnosis present

## 2019-09-15 DIAGNOSIS — M2500 Hemarthrosis, unspecified joint: Secondary | ICD-10-CM | POA: Diagnosis not present

## 2019-09-15 DIAGNOSIS — R509 Fever, unspecified: Secondary | ICD-10-CM | POA: Diagnosis not present

## 2019-09-15 LAB — COMPREHENSIVE METABOLIC PANEL
ALT: 27 U/L (ref 0–44)
AST: 27 U/L (ref 15–41)
Albumin: 3.4 g/dL — ABNORMAL LOW (ref 3.5–5.0)
Alkaline Phosphatase: 63 U/L (ref 38–126)
Anion gap: 11 (ref 5–15)
BUN: 21 mg/dL (ref 8–23)
CO2: 24 mmol/L (ref 22–32)
Calcium: 8.2 mg/dL — ABNORMAL LOW (ref 8.9–10.3)
Chloride: 99 mmol/L (ref 98–111)
Creatinine, Ser: 0.9 mg/dL (ref 0.61–1.24)
GFR calc Af Amer: >60 mL/min (ref >60)
GFR calc non Af Amer: >60 mL/min (ref >60)
Glucose, Bld: 178 mg/dL — ABNORMAL HIGH (ref 70–99)
Potassium: 3.5 mmol/L (ref 3.5–5.1)
Sodium: 134 mmol/L — ABNORMAL LOW (ref 135–145)
Total Bilirubin: 1.6 mg/dL — ABNORMAL HIGH (ref 0.3–1.2)
Total Protein: 6.3 g/dL — ABNORMAL LOW (ref 6.5–8.1)

## 2019-09-15 LAB — CBC WITH DIFFERENTIAL/PLATELET
Abs Immature Granulocytes: 0.09 10*3/uL — ABNORMAL HIGH (ref 0.00–0.07)
Basophils Absolute: 0 10*3/uL (ref 0.0–0.1)
Basophils Relative: 0 %
Eosinophils Absolute: 0 10*3/uL (ref 0.0–0.5)
Eosinophils Relative: 0 %
HCT: 35.3 % — ABNORMAL LOW (ref 39.0–52.0)
Hemoglobin: 11.8 g/dL — ABNORMAL LOW (ref 13.0–17.0)
Immature Granulocytes: 1 %
Lymphocytes Relative: 6 %
Lymphs Abs: 0.7 10*3/uL (ref 0.7–4.0)
MCH: 30.6 pg (ref 26.0–34.0)
MCHC: 33.4 g/dL (ref 30.0–36.0)
MCV: 91.5 fL (ref 80.0–100.0)
Monocytes Absolute: 1.2 10*3/uL — ABNORMAL HIGH (ref 0.1–1.0)
Monocytes Relative: 10 %
Neutro Abs: 9.6 10*3/uL — ABNORMAL HIGH (ref 1.7–7.7)
Neutrophils Relative %: 83 %
Platelets: 168 10*3/uL (ref 150–400)
RBC: 3.86 MIL/uL — ABNORMAL LOW (ref 4.22–5.81)
RDW: 13.1 % (ref 11.5–15.5)
WBC: 11.6 10*3/uL — ABNORMAL HIGH (ref 4.0–10.5)
nRBC: 0 % (ref 0.0–0.2)

## 2019-09-15 LAB — URINALYSIS, ROUTINE W REFLEX MICROSCOPIC
Bilirubin Urine: NEGATIVE
Glucose, UA: NEGATIVE mg/dL
Hgb urine dipstick: NEGATIVE
Ketones, ur: 20 mg/dL — AB
Leukocytes,Ua: NEGATIVE
Nitrite: NEGATIVE
Protein, ur: NEGATIVE mg/dL
Specific Gravity, Urine: 1.011 (ref 1.005–1.030)
pH: 5 (ref 5.0–8.0)

## 2019-09-15 LAB — CBG MONITORING, ED: Glucose-Capillary: 188 mg/dL — ABNORMAL HIGH (ref 70–99)

## 2019-09-15 MED ORDER — SODIUM CHLORIDE 0.9 % IV SOLN
INTRAVENOUS | Status: DC
  Administered 2019-09-16: 08:00:00 via INTRAVENOUS

## 2019-09-15 MED ORDER — VANCOMYCIN HCL IN DEXTROSE 1-5 GM/200ML-% IV SOLN
1000.0000 mg | Freq: Once | INTRAVENOUS | Status: AC
  Administered 2019-09-15: 1000 mg via INTRAVENOUS
  Filled 2019-09-15: qty 200

## 2019-09-15 MED ORDER — ACETAMINOPHEN 325 MG PO TABS
650.0000 mg | ORAL_TABLET | Freq: Four times a day (QID) | ORAL | Status: DC | PRN
  Administered 2019-09-16 – 2019-09-17 (×4): 650 mg via ORAL
  Filled 2019-09-15 (×6): qty 2

## 2019-09-15 MED ORDER — METHOCARBAMOL 500 MG PO TABS: 500.0000 mg | ORAL_TABLET | Freq: Four times a day (QID) | ORAL | Status: DC | PRN

## 2019-09-15 MED ORDER — SODIUM CHLORIDE 0.9 % IV SOLN
2.0000 g | Freq: Once | INTRAVENOUS | Status: AC
  Administered 2019-09-16: 2 g via INTRAVENOUS
  Filled 2019-09-15: qty 2

## 2019-09-15 MED ORDER — ONDANSETRON HCL 4 MG/2ML IJ SOLN
4.0000 mg | Freq: Four times a day (QID) | INTRAMUSCULAR | Status: DC | PRN
  Administered 2019-09-16 – 2019-09-19 (×4): 4 mg via INTRAVENOUS
  Filled 2019-09-15 (×4): qty 2

## 2019-09-15 MED ORDER — ONDANSETRON HCL 4 MG PO TABS: 4.0000 mg | ORAL_TABLET | Freq: Four times a day (QID) | ORAL | Status: DC | PRN

## 2019-09-15 MED ORDER — DILTIAZEM HCL ER COATED BEADS 240 MG PO CP24
240.0000 mg | ORAL_CAPSULE | Freq: Every day | ORAL | Status: DC
  Administered 2019-09-16 – 2019-09-20 (×5): 240 mg via ORAL
  Filled 2019-09-15 (×5): qty 1

## 2019-09-15 MED ORDER — GABAPENTIN 300 MG PO CAPS
300.0000 mg | ORAL_CAPSULE | Freq: Three times a day (TID) | ORAL | Status: DC
  Administered 2019-09-16 – 2019-09-20 (×14): 300 mg via ORAL
  Filled 2019-09-15 (×14): qty 1

## 2019-09-15 MED ORDER — ACETAMINOPHEN 650 MG RE SUPP: 650.0000 mg | Freq: Four times a day (QID) | RECTAL | Status: DC | PRN

## 2019-09-15 MED ORDER — ACETAMINOPHEN 500 MG PO TABS
1000.0000 mg | ORAL_TABLET | Freq: Once | ORAL | Status: AC
  Administered 2019-09-15: 1000 mg via ORAL
  Filled 2019-09-15: qty 2

## 2019-09-15 MED ORDER — SODIUM CHLORIDE 0.9 % IV BOLUS
1000.0000 mL | Freq: Once | INTRAVENOUS | Status: AC
  Administered 2019-09-15: 1000 mL via INTRAVENOUS

## 2019-09-15 MED ORDER — CLINDAMYCIN PHOSPHATE 600 MG/50ML IV SOLN
600.0000 mg | Freq: Once | INTRAVENOUS | Status: AC
  Administered 2019-09-15: 600 mg via INTRAVENOUS
  Filled 2019-09-15: qty 50

## 2019-09-15 NOTE — ED Notes (Signed)
Pt has completed fluid bolus x2 and antibiotics x2. Pt still appearing to be very lethargic. Pt is easily aroused to voice. AOx4. Temp 98.4, HR 71, RR 17, BP 103/67 MAP 79

## 2019-09-15 NOTE — ED Triage Notes (Signed)
Per EMS, Pt had a knee replacement on Monday. Family states he has not been himself today, poor PO intake, appears to be saturated in strong smelling urine. Pt has a low grade fever, and HR 85-110 (hx of afib)

## 2019-09-15 NOTE — Anesthesia Postprocedure Evaluation (Signed)
Anesthesia Post Note  Patient: Mathew Simpson  Procedure(s) Performed: TOTAL KNEE ARTHROPLASTY (Left Knee)     Patient location during evaluation: PACU Anesthesia Type: General Level of consciousness: awake and alert Pain management: pain level controlled Vital Signs Assessment: post-procedure vital signs reviewed and stable Respiratory status: spontaneous breathing, nonlabored ventilation, respiratory function stable and patient connected to nasal cannula oxygen Cardiovascular status: blood pressure returned to baseline and stable Postop Assessment: no apparent nausea or vomiting Anesthetic complications: no    Last Vitals:  Vitals:   09/13/19 0452 09/13/19 0923  BP: (!) 145/97 (!) 141/80  Pulse: 75 86  Resp: 16 16  Temp: 36.5 C 36.4 C  SpO2: 100% 99%    Last Pain:  Vitals:   09/13/19 1437  TempSrc:   PainSc: 0-No pain                 Anaiza Behrens S

## 2019-09-15 NOTE — ED Notes (Signed)
Pt provided [place] sandwich and drink. Instructed by Dr. Hal Hope to make pt NPO after he eats.

## 2019-09-15 NOTE — ED Notes (Signed)
Pt soiled linens, I changed chucks and pt linens and assisted him with use of urinal to void.

## 2019-09-15 NOTE — ED Notes (Signed)
Pt presented visibly tired. He has fallen asleep several times during triage. Pt states he doesn't know why he is so tired, he was able to get good rest last night. Pts left lower extremity is erythematous around incision site of left knee, as well as progressing erythema towards upper thigh. The extremity is edematous and it is warm to touch. No visible exudates. Steri strips are still in place from surgery.

## 2019-09-15 NOTE — H&P (Signed)
History and Physical    Mathew Simpson [id number] DOB: 03/13/1936 DOA: 09/15/2019  PCP: Mathew Ginger, PA-C  Patient coming from: Home.  Chief Complaint: Confusion.  History obtained from patient's daughter and ER physician.  HPI: Mathew Simpson is a 83 y.o. male with history of recently diagnosed A. fib, hypertension had total left knee replacement on September 12, 2019 -3 days ago and was discharged home 2 days ago following which patient started becoming more confused and not ambulating and was also at times hallucinating.  Given the symptoms initially his pain medication was discontinued but still patient was confused and was brought to the ER.  Per daughter patient did not have any chest pain shortness of breath nausea vomiting or diarrhea.  Has not been eating well for last 1 day.  ED Course: In the ER patient initially appeared confused and febrile with a temperature of 100.2 F chest x-ray shows some cardiomegaly and congestion.  UA was unremarkable.  On exam patient's left knee looks inflamed and erythematous.  Concerning for septic knee.  Patient orthopedic surgeon Dr. Elmyra Ricks was notified.  Patient was started on empiric antibiotic after obtaining blood cultures.  Patient labs show WBC count of 11.6 glucose 178 sodium 134 albumin 3.4 hemoglobin 11.8 platelets 168 COVID-19 test was negative.  At the time of my exam patient has become more alert awake and requesting something to eat.  Moving all extremities.  Is able to provide history.  Review of Systems: As per HPI, rest all negative.   Past Medical History:  Diagnosis Date  . Arthritis   . DJD (degenerative joint disease)   . Dysrhythmia   . Glaucoma   . Hypertension     Past Surgical History:  Procedure Laterality Date  . EYE SURGERY     bil   . JOINT REPLACEMENT    . knee surgery     arthroscopic  . TOTAL KNEE ARTHROPLASTY Left 09/12/2019   Procedure: TOTAL KNEE ARTHROPLASTY;  Surgeon: Mathew Arabian, MD;   Location: [facility];  Service: Orthopedics;  Laterality: Left;  21min     reports that he has quit smoking. He has never used smokeless tobacco. He reports previous alcohol use. He reports previous drug use.  Allergies  Allergen Reactions  . Penicillins Anaphylaxis    Did it involve swelling of the face/tongue/throat, SOB, or low BP? Yes Did it involve sudden or severe rash/hives, skin peeling, or any reaction on the inside of your mouth or nose? Unknown Did you need to seek medical attention at a hospital or doctor's office? Yes When did it last happen?50+ years If all above answers are "NO", may proceed with cephalosporin use.     Family History  Family history unknown: Yes    Prior to Admission medications   Medication Sig Start Date End Date Taking? Authorizing Provider  apixaban (ELIQUIS) 5 MG TABS tablet Take 5 mg by mouth 2 (two) times daily.   Yes [provider]  Calcium Citrate-Vitamin D (CALCIUM + D PO) Take 1 tablet by mouth 2 (two) times daily.   Yes [provider]  Cholecalciferol (DIALYVITE VITAMIN D 5000) 125 MCG (5000 UT) capsule Take 5,000 Units by mouth daily.   Yes [provider]  diltiazem (TIAZAC) 240 MG 24 hr capsule Take 240 mg by mouth daily. 04/12/19  Yes [provider]  gabapentin (NEURONTIN) 300 MG capsule Take 300 mg by mouth 3 (three) times daily.   Yes [provider]  lisinopril (ZESTRIL) 20 MG tablet Take 20 mg by mouth daily.   Yes [provider]  methocarbamol (ROBAXIN) 500 MG tablet Take 1 tablet (500 mg total) by mouth every 6 (six) hours as needed for muscle spasms. 09/13/19  Yes Maurice March, PA-C  Multiple Vitamin (MULTIVITAMIN WITH MINERALS) TABS tablet Take 1 tablet by mouth daily.   Yes [provider]  Multiple Vitamins-Minerals (PRESERVISION AREDS 2) CAPS Take 1 capsule by mouth 2 (two) times daily.   Yes [provider]  oxyCODONE (OXY IR/ROXICODONE) 5 MG  immediate release tablet Take 1-2 tablets (5-10 mg total) by mouth every 6 (six) hours as needed for moderate pain (pain score 4-6). 09/13/19  Yes Griffith Citron R, PA-C  trimethoprim-polymyxin b (POLYTRIM) ophthalmic solution Place 1 drop into the left eye See admin instructions. Instill 1 drop into left eye 4 times daily for 2 days after eye injection 04/07/19  Yes [provider]    Physical Exam: Constitutional: Moderately built and nourished. Vitals:   09/15/19 2100 09/15/19 2130 09/15/19 2200 09/15/19 2215  BP: 122/74 107/74 105/63 105/72  Pulse: 65 66 72 (!) 49  Resp: 17 17 18 19   Temp:      TempSrc:      SpO2: 98% 97% 98% 96%   Eyes: Anicteric no pallor. ENMT: No discharge from the ears eyes nose or mouth. Neck: No mass felt.  No neck rigidity. Respiratory: No rhonchi or crepitations. Cardiovascular: S1-S2 heard. Abdomen: Soft nontender bowel sounds present. Musculoskeletal: Erythematous and swollen left knee with no active discharge seen. Skin: Erythematous left knee. Neurologic: Alert awake oriented to his name and place moves all extremities. Psychiatric: Oriented to his name and place.   Labs on Admission: I have personally reviewed following labs and imaging studies  CBC: Recent Labs  Lab 09/09/19 0900 09/13/19 0246 09/15/19 1710  WBC 6.8 10.7* 11.6*  NEUTROABS 4.9  --  9.6*  HGB 13.6 11.2* 11.8*  HCT 42.1 34.3* 35.3*  MCV 93.1 93.2 91.5  PLT 212 181 [id number]   Basic Metabolic Panel: Recent Labs  Lab 09/09/19 0900 09/13/19 0246 09/15/19 1710  NA 142 136 134*  K 4.8 4.2 3.5  CL 109 103 99  CO2 28 26 24   GLUCOSE 115* 187* 178*  BUN 20 21 21   CREATININE 0.85 0.77 0.90  CALCIUM 8.8* 8.4* 8.2*   GFR: Estimated Creatinine Clearance: 62.3 mL/min (by C-G formula based on SCr of 0.9 mg/dL). Liver Function Tests: Recent Labs  Lab 09/09/19 0900 09/15/19 1710  AST 23 27  ALT 22 27  ALKPHOS 66 63  BILITOT 0.5 1.6*  PROT 6.5 6.3*  ALBUMIN 3.9  3.4*   No results for input(s): LIPASE, AMYLASE in the last 168 hours. No results for input(s): AMMONIA in the last 168 hours. Coagulation Profile: Recent Labs  Lab 09/09/19 0900  INR 1.1   Cardiac Enzymes: No results for input(s): CKTOTAL, CKMB, CKMBINDEX, TROPONINI in the last 168 hours. BNP (last 3 results) No results for input(s): PROBNP in the last 8760 hours. HbA1C: No results for input(s): HGBA1C in the last 72 hours. CBG: Recent Labs  Lab 09/15/19 2021  GLUCAP 188*   Lipid Profile: No results for input(s): CHOL, HDL, LDLCALC, TRIG, CHOLHDL, LDLDIRECT in the last 72 hours. Thyroid Function Tests: No results for input(s): TSH, T4TOTAL, FREET4, T3FREE, THYROIDAB in the last 72 hours. Anemia Panel: No results for input(s): VITAMINB12, FOLATE, FERRITIN, TIBC, IRON, RETICCTPCT in the last 72 hours. Urine analysis:  Component Value Date/Time   COLORURINE YELLOW 09/15/2019 1830   APPEARANCEUR CLEAR 09/15/2019 1830   LABSPEC 1.011 09/15/2019 1830   PHURINE 5.0 09/15/2019 1830   GLUCOSEU NEGATIVE 09/15/2019 1830   HGBUR NEGATIVE 09/15/2019 1830   BILIRUBINUR NEGATIVE 09/15/2019 1830   KETONESUR 20 (A) 09/15/2019 1830   PROTEINUR NEGATIVE 09/15/2019 1830   NITRITE NEGATIVE 09/15/2019 1830   LEUKOCYTESUR NEGATIVE 09/15/2019 1830   Sepsis Labs: @LABRCNTIP (procalcitonin:4,lacticidven:4) ) Recent Results (from the past 240 hour(s))  Novel Coronavirus, NAA (Hosp order, Send-out to Ref Lab; TAT 18-24 hrs     Status: None   Collection Time: 09/08/19  8:41 AM   Specimen: Nasopharyngeal Swab; Respiratory  Result Value Ref Range Status   SARS-CoV-2, NAA NOT DETECTED NOT DETECTED Final    Comment: (NOTE) This nucleic acid amplification test was developed and its performance characteristics determined by [employer]. Nucleic acid amplification tests include PCR and TMA. This test has not been FDA cleared or approved. This test has been authorized by FDA under an  Emergency Use Authorization (EUA). This test is only authorized for the duration of time the declaration that circumstances exist justifying the authorization of the emergency use of in vitro diagnostic tests for detection of SARS-CoV-2 virus and/or diagnosis of COVID-19 infection under section 564(b)(1) of the Act, 21 U.S.C. [id number]) (1), unless the authorization is terminated or revoked sooner. When diagnostic testing is negative, the possibility of a false negative result should be considered in the context of a patient's recent exposures and the presence of clinical signs and symptoms consistent with COVID-19. An individual without symptoms of COVID- 19 and who is not shedding SARS-CoV-2 vi rus would expect to have a negative (not detected) result in this assay. Performed At: [facility] [address] [place], [place] [id number] Rush Farmer MD [id number]    [place]  Final    Comment: Performed at [facility], [place] [address], [place], [place] [address]  Surgical pcr screen     Status: None   Collection Time: 09/09/19  9:00 AM   Specimen: Nasal Mucosa; Nasal Swab  Result Value Ref Range Status   MRSA, PCR NEGATIVE NEGATIVE Final   Staphylococcus aureus NEGATIVE NEGATIVE Final    Comment: (NOTE) The Xpert SA Assay (FDA approved for NASAL specimens in patients 73 years of age and older), is one component of a comprehensive surveillance program. It is not intended to diagnose infection nor to guide or monitor treatment. Performed at [facility], [place] [address], [place], [place] [address]   Culture, blood (routine x 2)     Status: None (Preliminary result)   Collection Time: 09/15/19  5:10 PM   Specimen: BLOOD LEFT FOREARM  Result Value Ref Range Status   Specimen Description   Final    BLOOD LEFT FOREARM Performed at [facility], [place] [address] [place], [place] [address]    Special Requests    Final    BOTTLES DRAWN AEROBIC ONLY Blood Culture adequate volume Performed at [facility], [place] [address] [place], [place] [address]    Culture PENDING  Incomplete   Report Status PENDING  Incomplete     Radiological Exams on Admission: Dg Chest Portable 1 View  Result Date: 09/15/2019 CLINICAL DATA:  Fever EXAM: PORTABLE CHEST 1 VIEW COMPARISON:  None. FINDINGS: There is mild cardiomegaly. There is prominence to the pulmonary vasculature. Aortic knob calcifications. No pleural effusion. No significant osseous abnormality. IMPRESSION:  Cardiomegaly and pulmonary vascular congestion Electronically Signed   By: Prudencio Pair M.D.   On: 09/15/2019 17:24    EKG: Independently reviewed.  A. fib rate controlled.  Assessment/Plan Principal Problem:   Septic joint of left knee joint (HCC) Active Problems:   Acute blood loss anemia   Essential hypertension    1. Possible septic left knee status post recent total knee replacement -we will keep patient n.p.o. past midnight in anticipation of possible procedure and continue antibiotics for now follow cultures. 2. Acute encephalopathy likely from septic picture is improving at the time of my exam patient is following commands and is oriented to his name and place.  As per the family disease improvement. 3. Acute blood loss anemia follow CBC. 4. History of recently diagnosed A. fib on Cardizem.  Holding apixaban in anticipation of possible procedure family agreeable. 5. Hypertension on ACE inhibitor. 6. History of chronic pain.  Given that patient has possible septic knee will need more than 2 midnight stay and will need inpatient status.   DVT prophylaxis: Presently on SCDs.  Will need to restart apixaban soon once patient procedure is done and okay with orthopedic surgery. Code Status: Full code as confirmed with patient's daughter. Family Communication: Patient's daughter. Disposition Plan: To be determined.  Consults called: Orthopedic surgeon. Admission status: Inpatient.   Rise Patience MD [facility] Pager [phone].  If 7PM-7AM, please contact night-coverage [email or web address] Password [facility]  09/15/2019, 11:55 PM

## 2019-09-15 NOTE — ED Provider Notes (Signed)
[facility] DEPT Provider Note   CSN: [id number] Arrival date & time: 09/15/19  1555     History   Chief Complaint Chief Complaint  Patient presents with  . Leg Pain    HPI Mathew Simpson is a 83 y.o. male.     Pt presents to the ED today with a fever, decreased oral intake, and AMS.  The pt had a left TKA by Dr. Maureen Ralphs on 9/21.  He was d/c on 9/22.  Pt is a poor historian.  He has been urinating on himself and has not been eating or drinking much according to EMS.  Pt does have a hx of afib and is on Eliquis.  CHA2DS2/VAS Stroke Risk Points  3 (age, htn)     Past Medical History:  Diagnosis Date  . Arthritis   . DJD (degenerative joint disease)   . Dysrhythmia   . Glaucoma   . Hypertension     Patient Active Problem List   Diagnosis Date Noted  . OA (osteoarthritis) of knee 09/12/2019    Past Surgical History:  Procedure Laterality Date  . EYE SURGERY     bil   . JOINT REPLACEMENT    . knee surgery     arthroscopic  . TOTAL KNEE ARTHROPLASTY Left 09/12/2019   Procedure: TOTAL KNEE ARTHROPLASTY;  Surgeon: Gaynelle Arabian, MD;  Location: [facility];  Service: Orthopedics;  Laterality: Left;  59min        Home Medications    Prior to Admission medications   Medication Sig Start Date End Date Taking? Authorizing Provider  apixaban (ELIQUIS) 5 MG TABS tablet Take 5 mg by mouth 2 (two) times daily.   Yes [provider]  Calcium Citrate-Vitamin D (CALCIUM + D PO) Take 1 tablet by mouth 2 (two) times daily.   Yes [provider]  Cholecalciferol (DIALYVITE VITAMIN D 5000) 125 MCG (5000 UT) capsule Take 5,000 Units by mouth daily.   Yes [provider]  diltiazem (TIAZAC) 240 MG 24 hr capsule Take 240 mg by mouth daily. 04/12/19  Yes [provider]  gabapentin (NEURONTIN) 300 MG capsule Take 300 mg by mouth 3 (three) times daily.   Yes [provider]  lisinopril (ZESTRIL) 20 MG tablet Take  20 mg by mouth daily.   Yes [provider]  methocarbamol (ROBAXIN) 500 MG tablet Take 1 tablet (500 mg total) by mouth every 6 (six) hours as needed for muscle spasms. 09/13/19  Yes Maurice March, PA-C  Multiple Vitamin (MULTIVITAMIN WITH MINERALS) TABS tablet Take 1 tablet by mouth daily.   Yes [provider]  Multiple Vitamins-Minerals (PRESERVISION AREDS 2) CAPS Take 1 capsule by mouth 2 (two) times daily.   Yes [provider]  oxyCODONE (OXY IR/ROXICODONE) 5 MG immediate release tablet Take 1-2 tablets (5-10 mg total) by mouth every 6 (six) hours as needed for moderate pain (pain score 4-6). 09/13/19  Yes Griffith Citron R, PA-C  trimethoprim-polymyxin b (POLYTRIM) ophthalmic solution Place 1 drop into the left eye See admin instructions. Instill 1 drop into left eye 4 times daily for 2 days after eye injection 04/07/19  Yes [provider]    Family History History reviewed. No pertinent family history.  Social History Social History   Tobacco Use  . Smoking status: Former Research scientist (life sciences)  . Smokeless tobacco: Never Used  . Tobacco comment: quit in 20's  Substance Use Topics  . Alcohol use: Not Currently  . Drug use:  Not Currently     Allergies   Penicillins   Review of Systems Review of Systems  Constitutional: Positive for fatigue.  Neurological: Positive for weakness.  All other systems reviewed and are negative.    Physical Exam Updated Vital Signs BP 138/80 (BP Location: Left Arm)   Pulse 76   Temp 98.3 F (36.8 C) (Oral)   Resp 16   SpO2 99%   Physical Exam Vitals signs and nursing note reviewed.  Constitutional:      Appearance: Normal appearance.  HENT:     Head: Normocephalic and atraumatic.     Right Ear: External ear normal.     Left Ear: External ear normal.     Nose: Nose normal.     Mouth/Throat:     Mouth: Mucous membranes are moist.     Pharynx: Oropharynx is clear.  Eyes:     Extraocular Movements:  Extraocular movements intact.     Conjunctiva/sclera: Conjunctivae normal.     Pupils: Pupils are equal, round, and reactive to light.  Neck:     Musculoskeletal: Normal range of motion and neck supple.  Cardiovascular:     Rate and Rhythm: Normal rate. Rhythm irregular.     Pulses: Normal pulses.     Heart sounds: Normal heart sounds.  Pulmonary:     Effort: Pulmonary effort is normal.     Breath sounds: Normal breath sounds.  Abdominal:     General: Abdomen is flat. Bowel sounds are normal.     Palpations: Abdomen is soft.  Musculoskeletal:     Comments: Left knee redness, lymphangitis.  See pictures.  Skin:    Capillary Refill: Capillary refill takes less than 2 seconds.  Neurological:     General: No focal deficit present.     Mental Status: He is alert. He is disoriented.  Psychiatric:        Mood and Affect: Mood normal.        Behavior: Behavior normal.        Thought Content: Thought content normal.        Judgment: Judgment normal.          ED Treatments / Results  Labs (all labs ordered are listed, but only abnormal results are displayed) Labs Reviewed  COMPREHENSIVE METABOLIC PANEL - Abnormal; Notable for the following components:      Result Value   Sodium 134 (*)    Glucose, Bld 178 (*)    Calcium 8.2 (*)    Total Protein 6.3 (*)    Albumin 3.4 (*)    Total Bilirubin 1.6 (*)    All other components within normal limits  CBC WITH DIFFERENTIAL/PLATELET - Abnormal; Notable for the following components:   WBC 11.6 (*)    RBC 3.86 (*)    Hemoglobin 11.8 (*)    HCT 35.3 (*)    Neutro Abs 9.6 (*)    Monocytes Absolute 1.2 (*)    Abs Immature Granulocytes 0.09 (*)    All other components within normal limits  URINALYSIS, ROUTINE W REFLEX MICROSCOPIC - Abnormal; Notable for the following components:   Ketones, ur 20 (*)    All other components within normal limits  CULTURE, BLOOD (ROUTINE X 2)  URINE CULTURE  CULTURE, BLOOD (ROUTINE X 2)  SARS  CORONAVIRUS 2 (TAT 6-24 HRS)    EKG EKG Interpretation  Date/Time:  Thursday September 15 2019 16:13:53 EDT Ventricular Rate:  80 PR Interval:    QRS Duration: 107 QT Interval:  383 QTC Calculation: 442 R Axis:   110 Text Interpretation:  Right and left arm electrode reversal, interpretation assumes no reversal Atrial fibrillation Right axis deviation Abnormal inferior Q waves Abnormal T, consider ischemia, lateral leads No significant change since last tracing Confirmed by Isla Pence [id number]) on 09/15/2019 5:37:25 PM   Radiology Dg Chest Portable 1 View  Result Date: 09/15/2019 CLINICAL DATA:  Fever EXAM: PORTABLE CHEST 1 VIEW COMPARISON:  None. FINDINGS: There is mild cardiomegaly. There is prominence to the pulmonary vasculature. Aortic knob calcifications. No pleural effusion. No significant osseous abnormality. IMPRESSION: Cardiomegaly and pulmonary vascular congestion Electronically Signed   By: Prudencio Pair M.D.   On: 09/15/2019 17:24    Procedures Procedures (including critical care time)  Medications Ordered in ED Medications  sodium chloride 0.9 % bolus 1,000 mL (0 mLs Intravenous Stopped 09/15/19 1858)  acetaminophen (TYLENOL) tablet 1,000 mg (1,000 mg Oral Given 09/15/19 1730)  vancomycin (VANCOCIN) IVPB 1000 mg/200 mL premix (0 mg Intravenous Stopped 09/15/19 1858)  clindamycin (CLEOCIN) IVPB 600 mg (600 mg Intravenous New Bag/Given 09/15/19 1857)     Initial Impression / Assessment and Plan / ED Course  I have reviewed the triage vital signs and the nursing notes.  Pertinent labs & imaging results that were available during my care of the patient were reviewed by me and considered in my medical decision making (see chart for details).    Pt d/w Dr. Maureen Ralphs (ortho) who was ok with Abx given.  He will see pt in the morning.    Pt given IV abx and IVFs.  The pt's bp is better after fluids.  Pt's daughter notified about pt's condition.  She is worried pt may need  to go to rehab while recovering as she's unable to get him up.  I told her they would address that while he is here.  Pt d/w Dr. Hal Hope ([facility]) who will admit.  Final Clinical Impressions(s) / ED Diagnoses   Final diagnoses:  Cellulitis of left lower extremity    ED Discharge Orders    None       Isla Pence, MD 09/15/19 1939

## 2019-09-15 NOTE — ED Notes (Signed)
Pt still lethargic. Pt is easily aroused and AOx4. I asked patient if he would be able to use a urinal, pt states he would rather  The catheter.

## 2019-09-16 DIAGNOSIS — M2500 Hemarthrosis, unspecified joint: Secondary | ICD-10-CM

## 2019-09-16 DIAGNOSIS — R509 Fever, unspecified: Secondary | ICD-10-CM

## 2019-09-16 DIAGNOSIS — T888XXA Other specified complications of surgical and medical care, not elsewhere classified, initial encounter: Secondary | ICD-10-CM

## 2019-09-16 DIAGNOSIS — M25569 Pain in unspecified knee: Secondary | ICD-10-CM

## 2019-09-16 DIAGNOSIS — G8918 Other acute postprocedural pain: Secondary | ICD-10-CM

## 2019-09-16 LAB — BASIC METABOLIC PANEL
Anion gap: 7 (ref 5–15)
BUN: 21 mg/dL (ref 8–23)
CO2: 24 mmol/L (ref 22–32)
Calcium: 8 mg/dL — ABNORMAL LOW (ref 8.9–10.3)
Chloride: 107 mmol/L (ref 98–111)
Creatinine, Ser: 0.71 mg/dL (ref 0.61–1.24)
GFR calc Af Amer: >60 mL/min (ref >60)
GFR calc non Af Amer: >60 mL/min (ref >60)
Glucose, Bld: 139 mg/dL — ABNORMAL HIGH (ref 70–99)
Potassium: 3.6 mmol/L (ref 3.5–5.1)
Sodium: 138 mmol/L (ref 135–145)

## 2019-09-16 LAB — CBG MONITORING, ED
Glucose-Capillary: 124 mg/dL — ABNORMAL HIGH (ref 70–99)
Glucose-Capillary: 159 mg/dL — ABNORMAL HIGH (ref 70–99)
Glucose-Capillary: 222 mg/dL — ABNORMAL HIGH (ref 70–99)

## 2019-09-16 LAB — URINE CULTURE: Culture: NO GROWTH

## 2019-09-16 LAB — CBC
HCT: 30.9 % — ABNORMAL LOW (ref 39.0–52.0)
Hemoglobin: 10.1 g/dL — ABNORMAL LOW (ref 13.0–17.0)
MCH: 30.3 pg (ref 26.0–34.0)
MCHC: 32.7 g/dL (ref 30.0–36.0)
MCV: 92.8 fL (ref 80.0–100.0)
Platelets: 149 10*3/uL — ABNORMAL LOW (ref 150–400)
RBC: 3.33 MIL/uL — ABNORMAL LOW (ref 4.22–5.81)
RDW: 13 % (ref 11.5–15.5)
WBC: 8.4 10*3/uL (ref 4.0–10.5)
nRBC: 0 % (ref 0.0–0.2)

## 2019-09-16 LAB — GLUCOSE, CAPILLARY: Glucose-Capillary: 143 mg/dL — ABNORMAL HIGH (ref 70–99)

## 2019-09-16 LAB — SARS CORONAVIRUS 2 (TAT 6-24 HRS): SARS Coronavirus 2: NEGATIVE

## 2019-09-16 MED ORDER — VANCOMYCIN HCL IN DEXTROSE 1-5 GM/200ML-% IV SOLN
1000.0000 mg | Freq: Two times a day (BID) | INTRAVENOUS | Status: DC
  Administered 2019-09-16: 1000 mg via INTRAVENOUS
  Filled 2019-09-16: qty 200

## 2019-09-16 MED ORDER — DOXYCYCLINE HYCLATE 100 MG PO TABS
100.0000 mg | ORAL_TABLET | Freq: Two times a day (BID) | ORAL | Status: DC
  Administered 2019-09-16 – 2019-09-20 (×8): 100 mg via ORAL
  Filled 2019-09-16 (×8): qty 1

## 2019-09-16 MED ORDER — SODIUM CHLORIDE 0.9 % IV SOLN
2.0000 g | Freq: Three times a day (TID) | INTRAVENOUS | Status: DC
  Administered 2019-09-16 (×2): 2 g via INTRAVENOUS
  Filled 2019-09-16 (×3): qty 2

## 2019-09-16 MED ORDER — ENOXAPARIN SODIUM 40 MG/0.4ML ~~LOC~~ SOLN
40.0000 mg | SUBCUTANEOUS | Status: DC
  Administered 2019-09-16 – 2019-09-17 (×2): 40 mg via SUBCUTANEOUS
  Filled 2019-09-16 (×2): qty 0.4

## 2019-09-16 NOTE — Plan of Care (Signed)
  Problem: Education: Goal: Knowledge of the prescribed therapeutic regimen will improve Outcome: Progressing Goal: Individualized Educational Video(s) Outcome: Progressing   Problem: Activity: Goal: Ability to avoid complications of mobility impairment will improve Outcome: Progressing Goal: Range of joint motion will improve Outcome: Progressing   Problem: Clinical Measurements: Goal: Postoperative complications will be avoided or minimized Outcome: Progressing   Problem: Pain Management: Goal: Pain level will decrease with appropriate interventions Outcome: Progressing   Problem: Skin Integrity: Goal: Will show signs of wound healing Outcome: Progressing   Problem: Fluid Volume: Goal: Hemodynamic stability will improve Outcome: Progressing   Problem: Clinical Measurements: Goal: Diagnostic test results will improve Outcome: Progressing Goal: Signs and symptoms of infection will decrease Outcome: Progressing   Problem: Respiratory: Goal: Ability to maintain adequate ventilation will improve Outcome: Progressing

## 2019-09-16 NOTE — Progress Notes (Signed)
PT Cancellation Note  Patient Details Name: Mathew Simpson MRN: [id number] DOB: 12-06-1936   Cancelled Treatment:    Reason Eval/Treat Not Completed: Patient not medically ready;Other (comment)  Patient evaluated for Lt TKA on 09/12/19. MD note on 09/16/19 indicates pt should be on rest with ice and elevation at this time. Recommends holing PT for 48 hours. Will follow up with pt on Sunday 09/18/19.  Kipp Brood, PT, DPT, [facility] Physical Therapist with [facility]  09/16/2019 2:40 PM

## 2019-09-16 NOTE — Progress Notes (Addendum)
PROGRESS NOTE    Mathew Simpson  [id number]  DOB: 1936-03-22  DOA: 09/15/2019 PCP: Gwendel Hanson  Brief Narrative:  83 y.o. male with history of recently diagnosed A. fib, hypertension who underwent total left knee replacement on 09/12/19 and was discharged home 2 days ago with resumption of chronic anticoagulation, brought in by family due to concern for altered mental status/confusion with hallucinations and not ambulating. Given the symptoms initially his pain medication were discontinued but still patient was confused and was brought to the ER.  Per daughter patient did not have any chest pain shortness of breath nausea vomiting or diarrhea.  Has not been eating well for last 1 day. ED Course: In the ER patient initially appeared confused and febrile with a temperature of 100.2 F chest x-ray shows some cardiomegaly and congestion.  UA was unremarkable.  On exam patient's left knee looks inflamed and erythematous.  Concerning for septic knee.  Patient orthopedic surgeon Dr. Elmyra Ricks was notified.  Patient was started on empiric antibiotic after obtaining blood cultures.  Patient labs show WBC count of 11.6 glucose 178 sodium 134 albumin 3.4 hemoglobin 11.8 platelets 168 COVID-19 test was negative.  Subjective:  Patient somnolent, sleeping comfortably, easily arousable and still in the ED awaiting bed availability when seen this morning.  States knee feels better now   Objective: Vitals:   09/16/19 0800 09/16/19 0830 09/16/19 0900 09/16/19 0930  BP: 129/83 136/87 132/79 131/80  Pulse: 90 99 95 99  Resp: (!) 23 (!) 25  (!) 21  Temp:      TempSrc:      SpO2: 98% 97% 97% 95%    Intake/Output Summary (Last 24 hours) at 09/16/2019 0947 Last data filed at 09/16/2019 0314 Gross per 24 hour  Intake 1344.8 ml  Output -  Net 1344.8 ml   There were no vitals filed for this visit.  Physical Examination:  General exam: Appears somnolent but comfortable  Respiratory system: Clear  to auscultation. Respiratory effort normal. Cardiovascular system: S1 & S2 heard, RRR. No JVD, murmurs, rubs, gallops or clicks. No pedal edema. Gastrointestinal system: Abdomen is nondistended, soft and nontender. No organomegaly or masses felt. Normal bowel sounds heard. Central nervous system: Somnolent. No focal neurological deficits. Extremities: Left knee swelling/redness as shown below.  Skin: Erythema around incision site as shown below Psychiatry: Judgement and insight could not be tested in detail. Mood & affect appropriate.       Data Reviewed: I have personally reviewed following labs and imaging studies  CBC: Recent Labs  Lab 09/13/19 0246 09/15/19 1710 09/16/19 0604  WBC 10.7* 11.6* 8.4  NEUTROABS  --  9.6*  --   HGB 11.2* 11.8* 10.1*  HCT 34.3* 35.3* 30.9*  MCV 93.2 91.5 92.8  PLT 181 168 [id number]*   Basic Metabolic Panel: Recent Labs  Lab 09/13/19 0246 09/15/19 1710 09/16/19 0604  NA 136 134* 138  K 4.2 3.5 3.6  CL 103 99 107  CO2 26 24 24   GLUCOSE 187* 178* 139*  BUN 21 21 21   CREATININE 0.77 0.90 0.71  CALCIUM 8.4* 8.2* 8.0*   GFR: Estimated Creatinine Clearance: 70.1 mL/min (by C-G formula based on SCr of 0.71 mg/dL). Liver Function Tests: Recent Labs  Lab 09/15/19 1710  AST 27  ALT 27  ALKPHOS 63  BILITOT 1.6*  PROT 6.3*  ALBUMIN 3.4*   No results for input(s): LIPASE, AMYLASE in the last 168 hours. No results for input(s): AMMONIA in the last  168 hours. Coagulation Profile: No results for input(s): INR, PROTIME in the last 168 hours. Cardiac Enzymes: No results for input(s): CKTOTAL, CKMB, CKMBINDEX, TROPONINI in the last 168 hours. BNP (last 3 results) No results for input(s): PROBNP in the last 8760 hours. HbA1C: No results for input(s): HGBA1C in the last 72 hours. CBG: Recent Labs  Lab 09/15/19 2021 09/16/19 0031 09/16/19 0801  GLUCAP 188* 222* 124*   Lipid Profile: No results for input(s): CHOL, HDL, LDLCALC, TRIG, CHOLHDL,  LDLDIRECT in the last 72 hours. Thyroid Function Tests: No results for input(s): TSH, T4TOTAL, FREET4, T3FREE, THYROIDAB in the last 72 hours. Anemia Panel: No results for input(s): VITAMINB12, FOLATE, FERRITIN, TIBC, IRON, RETICCTPCT in the last 72 hours. Sepsis Labs: No results for input(s): PROCALCITON, LATICACIDVEN in the last 168 hours.  Recent Results (from the past 240 hour(s))  Novel Coronavirus, NAA (Hosp order, Send-out to Ref Lab; TAT 18-24 hrs     Status: None   Collection Time: 09/08/19  8:41 AM   Specimen: Nasopharyngeal Swab; Respiratory  Result Value Ref Range Status   SARS-CoV-2, NAA NOT DETECTED NOT DETECTED Final    Comment: (NOTE) This nucleic acid amplification test was developed and its performance characteristics determined by [employer]. Nucleic acid amplification tests include PCR and TMA. This test has not been FDA cleared or approved. This test has been authorized by FDA under an Emergency Use Authorization (EUA). This test is only authorized for the duration of time the declaration that circumstances exist justifying the authorization of the emergency use of in vitro diagnostic tests for detection of SARS-CoV-2 virus and/or diagnosis of COVID-19 infection under section 564(b)(1) of the Act, 21 U.S.C. [id number]) (1), unless the authorization is terminated or revoked sooner. When diagnostic testing is negative, the possibility of a false negative result should be considered in the context of a patient's recent exposures and the presence of clinical signs and symptoms consistent with COVID-19. An individual without symptoms of COVID- 19 and who is not shedding SARS-CoV-2 vi rus would expect to have a negative (not detected) result in this assay. Performed At: [facility] [address] [place], [place] [id number] Rush Farmer MD [id number]    [place]  Final    Comment: Performed at [facility], [place] [address], [place], [place] [address]  Surgical pcr screen     Status: None   Collection Time: 09/09/19  9:00 AM   Specimen: Nasal Mucosa; Nasal Swab  Result Value Ref Range Status   MRSA, PCR NEGATIVE NEGATIVE Final   Staphylococcus aureus NEGATIVE NEGATIVE Final    Comment: (NOTE) The Xpert SA Assay (FDA approved for NASAL specimens in patients 48 years of age and older), is one component of a comprehensive surveillance program. It is not intended to diagnose infection nor to guide or monitor treatment. Performed at [facility], [place] [address] [place], [place] [address]   Culture, blood (routine x 2)     Status: None (Preliminary result)   Collection Time: 09/15/19  5:10 PM   Specimen: BLOOD LEFT FOREARM  Result Value Ref Range Status   Specimen Description   Final    BLOOD LEFT FOREARM Performed at [facility], [place] [address], [place], [place] [address]    Special Requests   Final    BOTTLES DRAWN AEROBIC ONLY Blood Culture adequate volume Performed at [facility], [place] [address] [place], [place] [address]    Culture  PENDING  Incomplete   Report Status PENDING  Incomplete  SARS CORONAVIRUS 2 (TAT 6-24 HRS) Nasopharyngeal Nasopharyngeal Swab     Status: None   Collection Time: 09/15/19  6:44 PM   Specimen: Nasopharyngeal Swab  Result Value Ref Range Status   SARS Coronavirus 2 NEGATIVE NEGATIVE Final    Comment: (NOTE) SARS-CoV-2 target nucleic acids are NOT DETECTED. The SARS-CoV-2 RNA is generally detectable in upper and lower respiratory specimens during the acute phase of infection. Negative results do not preclude SARS-CoV-2 infection, do not rule out co-infections with other pathogens, and should not be used as the sole basis for treatment or other patient management decisions. Negative results must be combined with clinical observations, patient history, and epidemiological information. The expected result  is Negative. Fact Sheet for Patients: SugarRoll.be Fact Sheet for Healthcare Providers: https://www.woods-mathews.com/ This test is not yet approved or cleared by the [place] FDA and  has been authorized for detection and/or diagnosis of SARS-CoV-2 by FDA under an Emergency Use Authorization (EUA). This EUA will remain  in effect (meaning this test can be used) for the duration of the COVID-19 declaration under Section 56 4(b)(1) of the Act, 21 U.S.C. section 360bbb-3(b)(1), unless the authorization is terminated or revoked sooner. Performed at [facility], [place] [address], [place], [place] [address]       Radiology Studies: Dg Chest Portable 1 View  Result Date: 09/15/2019 CLINICAL DATA:  Fever EXAM: PORTABLE CHEST 1 VIEW COMPARISON:  None. FINDINGS: There is mild cardiomegaly. There is prominence to the pulmonary vasculature. Aortic knob calcifications. No pleural effusion. No significant osseous abnormality. IMPRESSION: Cardiomegaly and pulmonary vascular congestion Electronically Signed   By: Prudencio Pair M.D.   On: 09/15/2019 17:24     Scheduled Meds: . diltiazem  240 mg Oral Daily  . gabapentin  300 mg Oral TID   Continuous Infusions: . sodium chloride 100 mL/hr at 09/16/19 0740  . aztreonam Stopped (09/16/19 0820)  . vancomycin 1,000 mg (09/16/19 0854)    Assessment & Plan:   1. Acute encephalopathy: likely toxic encephalopathy in the setting of opiates for post op pain. He is improving at the time of my exam but still somnolent and needs to be monitored as inpatient. Cautious pain med use. Diet if more awake this afternoon.  2. 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 Ortho feels his symptomatology consistent with  hemarthrosis/expected post operative course in the setting of Eliquis use. NO evidence of septic knee per Ortho-appreciate their eval, follow recommendations: rest, ice and elevate the knee, hold off on PT for 48 hours. He was originally scheduled to return to the office on 10/6 but Ortho want him to now return on 9/29. Consider inpatient arthrocentesis if symptomatology worsens.  3. Acute blood loss anemia: In the setting of # 2. Hgb downtrending albeit in setting of IV fluids, monitor closely, transfuse prn. Resume Eliquis in am if hgb stable.  4. Recently diagnosed A. fib on Cardizem.  Holding apixaban in concern for #2  5. Low Grade temp/leucocytosis : resolved now. Could be inflammatory response to #2 vs cellulitis vs aspiration in setting of #1. CXR unremarkable. U/A -ve without any pyuria. Follow blood cx. Narrow abx - to doxycycline. S/p vancomycin/clinda/azactam in ED  6. Hypertension: on ACE inhibitor.  7. History of chronic pain: hold opiates (  oxy 5-10 q 6prn) in concern for #1, resume at lower dose/frequence if and when needed. Hold Robaxin. Resumed Neurontin.Watch for signs of withdrawal.      DVT prophylaxis: Eliquis on hold. Can watch on prophylactic lovenox Code Status: Full  Family / Patient Communication: Discussed with patient briefly as somnolent Disposition Plan: TBD.  PT evaluation in 48 hours     LOS: 1 day    Time spent: 35 minutes    Guilford Shi, MD [facility] Pager [phone]  If 7PM-7AM, please contact night-coverage [email or web address] Password [facility] 09/16/2019, 9:47 AM

## 2019-09-16 NOTE — ED Notes (Signed)
Pt wife Arbie Cookey at [phone]. Please call with updates.

## 2019-09-16 NOTE — Progress Notes (Signed)
Pharmacy Antibiotic Note  Mathew Simpson is a 83 y.o. male admitted on 09/15/2019 with sepsis.  Pharmacy has been consulted for vancomycin and aztreonam dosing.  Plan: Vancomycin 1gm iv x1, then Vancomycin 1000 mg IV Q 12 hrs. Goal AUC 400-550. Expected AUC: 542.9 SCr used: 0.9  Aztreonam 2gm iv x1, then 2gm iv q8hr    Temp (24hrs), Avg:99.1 F (37.3 C), Min:98.3 F (36.8 C), Max:100.2 F (37.9 C)  Recent Labs  Lab 09/09/19 0900 09/13/19 0246 09/15/19 1710 09/16/19 0604  WBC 6.8 10.7* 11.6* 8.4  CREATININE 0.85 0.77 0.90 0.71    Estimated Creatinine Clearance: 70.1 mL/min (by C-G formula based on SCr of 0.71 mg/dL).    Allergies  Allergen Reactions  . Penicillins Anaphylaxis    Did it involve swelling of the face/tongue/throat, SOB, or low BP? Yes Did it involve sudden or severe rash/hives, skin peeling, or any reaction on the inside of your mouth or nose? Unknown Did you need to seek medical attention at a hospital or doctor's office? Yes When did it last happen?50+ years If all above answers are "NO", may proceed with cephalosporin use.     Antimicrobials this admission: Vancomycin 09/15/2019 >> Aztreonam  09/16/2019 >>   Dose adjustments this admission: -  Microbiology results: - Thank you for allowing pharmacy to be a part of this patient's care.  Nani Skillern Crowford 09/16/2019 6:46 AM

## 2019-09-16 NOTE — ED Notes (Signed)
Drawn by Main Phlebotomy.

## 2019-09-16 NOTE — Progress Notes (Signed)
Subjective: [name] states he is not having significant knee pain. He was doing OK at home but had increased sedation and fever yesterday leading to the ED visit. He denies any wound drainage   Objective: Vital signs in last 24 hours: Temp:  [98.3 F (36.8 C)-100.2 F (37.9 C)] 99.6 F (37.6 C) (09/25 0313) Pulse Rate:  [49-97] 91 (09/25 0600) Resp:  [16-24] 18 (09/25 0600) BP: (100-139)/(63-101) 133/86 (09/25 0600) SpO2:  [95 %-100 %] 99 % (09/25 0600)  Intake/Output from previous day: 09/24 0701 - 09/25 0700 In: 1344.8 [IV Piggyback:1344.8] Out: -  Intake/Output this shift: No intake/output data recorded.  Recent Labs    09/15/19 1710 09/16/19 0604  HGB 11.8* 10.1*   Recent Labs    09/15/19 1710 09/16/19 0604  WBC 11.6* 8.4  RBC 3.86* 3.33*  HCT 35.3* 30.9*  PLT 168 149*   Recent Labs    09/15/19 1710 09/16/19 0604  NA 134* 138  K 3.5 3.6  CL 99 107  CO2 24 24  BUN 21 21  CREATININE 0.90 0.71  GLUCOSE 178* 139*  CALCIUM 8.2* 8.0*   No results for input(s): LABPT, INR in the last 72 hours.  Neurologically intact Neurovascular intact Incision: no drainage Compartment soft He has a small to moderate hemarthrosis. There is no drainage. He has mild pinkish discoloration around the incision which blanches with touch and dissipates with elevation of the leg    Assessment/Plan: S/P Left TKA- The patient does not have a septic knee. He does have swelling which is most likely from the Eliquis and not unusual for POD 4, especially when on Eliquis. His WBC is normal today which is also encouraging. He needs to rest, ice and elevate the knee and hold off on PT for 48 hours. He was originally scheduled to return to the office on 10/6 but I want him to return on 9/29 instead in order to monitor the swelling. We will follow if he is in the hospital over the weekend.   Pilar Plate Kamilia Carollo 09/16/2019, 7:03 AM

## 2019-09-16 NOTE — Progress Notes (Signed)
I have spoken with Mr Lindemann daughter and she is very concerned they they will not be able to care for him at home. They would like for [place] to look in to SNF placement. We had a discussion about that and the family feels strongly about it. I have ordered a social work consult

## 2019-09-17 ENCOUNTER — Inpatient Hospital Stay (HOSPITAL_COMMUNITY): Payer: Medicare HMO

## 2019-09-17 DIAGNOSIS — R112 Nausea with vomiting, unspecified: Secondary | ICD-10-CM

## 2019-09-17 LAB — BASIC METABOLIC PANEL
Anion gap: 10 (ref 5–15)
BUN: 20 mg/dL (ref 8–23)
CO2: 22 mmol/L (ref 22–32)
Calcium: 8 mg/dL — ABNORMAL LOW (ref 8.9–10.3)
Chloride: 108 mmol/L (ref 98–111)
Creatinine, Ser: 0.66 mg/dL (ref 0.61–1.24)
GFR calc Af Amer: >60 mL/min (ref >60)
GFR calc non Af Amer: >60 mL/min (ref >60)
Glucose, Bld: 130 mg/dL — ABNORMAL HIGH (ref 70–99)
Potassium: 3.3 mmol/L — ABNORMAL LOW (ref 3.5–5.1)
Sodium: 140 mmol/L (ref 135–145)

## 2019-09-17 LAB — GLUCOSE, CAPILLARY
Glucose-Capillary: 119 mg/dL — ABNORMAL HIGH (ref 70–99)
Glucose-Capillary: 131 mg/dL — ABNORMAL HIGH (ref 70–99)
Glucose-Capillary: 136 mg/dL — ABNORMAL HIGH (ref 70–99)
Glucose-Capillary: 137 mg/dL — ABNORMAL HIGH (ref 70–99)

## 2019-09-17 LAB — CBC
HCT: 29.5 % — ABNORMAL LOW (ref 39.0–52.0)
Hemoglobin: 9.7 g/dL — ABNORMAL LOW (ref 13.0–17.0)
MCH: 30.4 pg (ref 26.0–34.0)
MCHC: 32.9 g/dL (ref 30.0–36.0)
MCV: 92.5 fL (ref 80.0–100.0)
Platelets: 163 10*3/uL (ref 150–400)
RBC: 3.19 MIL/uL — ABNORMAL LOW (ref 4.22–5.81)
RDW: 13.1 % (ref 11.5–15.5)
WBC: 8.3 10*3/uL (ref 4.0–10.5)
nRBC: 0 % (ref 0.0–0.2)

## 2019-09-17 MED ORDER — POTASSIUM CHLORIDE CRYS ER 20 MEQ PO TBCR
40.0000 meq | EXTENDED_RELEASE_TABLET | Freq: Once | ORAL | Status: AC
  Administered 2019-09-17: 40 meq via ORAL
  Filled 2019-09-17: qty 2

## 2019-09-17 MED ORDER — HYDROCODONE-ACETAMINOPHEN 7.5-325 MG PO TABS
1.0000 | ORAL_TABLET | Freq: Four times a day (QID) | ORAL | Status: DC | PRN
  Administered 2019-09-17 – 2019-09-20 (×9): 1 via ORAL
  Filled 2019-09-17 (×9): qty 1

## 2019-09-17 NOTE — Progress Notes (Signed)
Orthopedics Progress Note  Subjective: Patient complaining of extreme pain with the left knee.  He reports no recent therapy post surgery.  He reports being on 3-4 Hydrocodone 7.5 daily for a long time due to chronic pain issues.  Nursing reporting poor pain control in the hospital.   Objective:  Vitals:   09/17/19 0614 09/17/19 1420  BP: 134/80 136/82  Pulse: 72 81  Resp: 20 (!) 22  Temp: 98.1 F (36.7 C) 98.4 F (36.9 C)  SpO2: 95% 99%    General: Awake and alert  Musculoskeletal: Left knee with moderate swelling and bruising consistent with post op TKR on Eliquis. Neg Hom[place] Neurovascularly intact  Lab Results  Component Value Date   WBC 8.3 09/17/2019   HGB 9.7 (L) 09/17/2019   HCT 29.5 (L) 09/17/2019   MCV 92.5 09/17/2019   PLT 163 09/17/2019       Component Value Date/Time   NA 140 09/17/2019 0528   K 3.3 (L) 09/17/2019 0528   CL 108 09/17/2019 0528   CO2 22 09/17/2019 0528   GLUCOSE 130 (H) 09/17/2019 0528   BUN 20 09/17/2019 0528   CREATININE 0.66 09/17/2019 0528   CALCIUM 8.0 (L) 09/17/2019 0528   GFRNONAA >60 09/17/2019 0528   GFRAA >60 09/17/2019 0528    Lab Results  Component Value Date   INR 1.1 09/09/2019    Assessment/Plan: POD #5 s/p TKR Patient admitted for fever and FTT at home.  According to his wife he was a three person max assist at home.  She does not feel that he can be safely discharged to home after hospitalization and is requesting SNF. He is currently not able to keep much down po. May need IVF for hyrdration support. Defer to medicine.  Restart PT tomorrow.   Doran Heater. Veverly Fells, MD 09/17/2019 3:34 PM

## 2019-09-17 NOTE — Progress Notes (Signed)
PROGRESS NOTE    Mathew Simpson  [id number]  DOB: March 19, 1936  DOA: 09/15/2019 PCP: Gwendel Hanson  Brief Narrative:  83 y.o. male with history of recently diagnosed A. fib, hypertension who underwent total left knee replacement on 09/12/19 and was discharged home 2 days ago with resumption of chronic anticoagulation, brought in by family due to concern for altered mental status/confusion with hallucinations and not ambulating. Given the symptoms initially his pain medication were discontinued but still patient was confused and was brought to the ER.  Per daughter patient did not have any chest pain shortness of breath nausea vomiting or diarrhea.  Has not been eating well for last 1 day. ED Course: In the ER patient initially appeared confused and febrile with a temperature of 100.2 F chest x-ray shows some cardiomegaly and congestion.  UA was unremarkable.  On exam patient's left knee looks inflamed and erythematous.  Concerning for septic knee.  Patient orthopedic surgeon Dr. Elmyra Ricks was notified.  Patient was started on empiric antibiotic after obtaining blood cultures.  Patient labs show WBC count of 11.6 glucose 178 sodium 134 albumin 3.4 hemoglobin 11.8 platelets 168 COVID-19 test was negative.  Subjective:  left leg pain.  No nausea no vomiting.  No chest pain abdominal pain.  Patient was actually sleepy and drowsy when I saw him on a repeat evaluation patient is awake. Reports that he is having difficulty with swallowing and has nausea and vomiting.   Objective: Vitals:   09/16/19 1336 09/16/19 2158 09/17/19 0614 09/17/19 1420  BP: (!) 147/87 136/86 134/80 136/82  Pulse: 92 97 72 81  Resp: 20 16 20  (!) 22  Temp: 99.2 F (37.3 C) 99 F (37.2 C) 98.1 F (36.7 C) 98.4 F (36.9 C)  TempSrc: Oral Oral  Oral  SpO2: 98% 96% 95% 99%  Weight: 79.4 kg     Height: 5\' 8"  (1.727 m)       Intake/Output Summary (Last 24 hours) at 09/17/2019 2017 Last data filed at 09/17/2019  1900 Gross per 24 hour  Intake 240 ml  Output 1500 ml  Net -1260 ml   Filed Weights   09/16/19 1336  Weight: 79.4 kg    Physical Examination:  General exam: Appears somnolent but comfortable  Respiratory system: Clear to auscultation. Respiratory effort normal. Cardiovascular system: S1 & S2 heard, RRR. No JVD, murmurs, rubs, gallops or clicks. No pedal edema. Gastrointestinal system: Abdomen is nondistended, soft and nontender. No organomegaly or masses felt. Normal bowel sounds heard. Central nervous system: Somnolent. No focal neurological deficits. Extremities: Left knee swelling/redness as shown below.  Skin: Erythema around incision site as shown below Psychiatry: Judgement and insight could not be tested in detail. Mood & affect appropriate.       Data Reviewed: I have personally reviewed following labs and imaging studies  CBC: Recent Labs  Lab 09/13/19 0246 09/15/19 1710 09/16/19 0604 09/17/19 0528  WBC 10.7* 11.6* 8.4 8.3  NEUTROABS  --  9.6*  --   --   HGB 11.2* 11.8* 10.1* 9.7*  HCT 34.3* 35.3* 30.9* 29.5*  MCV 93.2 91.5 92.8 92.5  PLT 181 168 149* [id number]   Basic Metabolic Panel: Recent Labs  Lab 09/13/19 0246 09/15/19 1710 09/16/19 0604 09/17/19 0528  NA 136 134* 138 140  K 4.2 3.5 3.6 3.3*  CL 103 99 107 108  CO2 26 24 24 22   GLUCOSE 187* 178* 139* 130*  BUN 21 21 21 20   CREATININE 0.77 0.90  0.71 0.66  CALCIUM 8.4* 8.2* 8.0* 8.0*   GFR: Estimated Creatinine Clearance: 70.1 mL/min (by C-G formula based on SCr of 0.66 mg/dL). Liver Function Tests: Recent Labs  Lab 09/15/19 1710  AST 27  ALT 27  ALKPHOS 63  BILITOT 1.6*  PROT 6.3*  ALBUMIN 3.4*   No results for input(s): LIPASE, AMYLASE in the last 168 hours. No results for input(s): AMMONIA in the last 168 hours. Coagulation Profile: No results for input(s): INR, PROTIME in the last 168 hours. Cardiac Enzymes: No results for input(s): CKTOTAL, CKMB, CKMBINDEX, TROPONINI in the last  168 hours. BNP (last 3 results) No results for input(s): PROBNP in the last 8760 hours. HbA1C: No results for input(s): HGBA1C in the last 72 hours. CBG: Recent Labs  Lab 09/16/19 1739 09/17/19 0023 09/17/19 0611 09/17/19 1220 09/17/19 1652  GLUCAP 143* 119* 136* 137* 131*   Lipid Profile: No results for input(s): CHOL, HDL, LDLCALC, TRIG, CHOLHDL, LDLDIRECT in the last 72 hours. Thyroid Function Tests: No results for input(s): TSH, T4TOTAL, FREET4, T3FREE, THYROIDAB in the last 72 hours. Anemia Panel: No results for input(s): VITAMINB12, FOLATE, FERRITIN, TIBC, IRON, RETICCTPCT in the last 72 hours. Sepsis Labs: No results for input(s): PROCALCITON, LATICACIDVEN in the last 168 hours.  Recent Results (from the past 240 hour(s))  Novel Coronavirus, NAA (Hosp order, Send-out to Ref Lab; TAT 18-24 hrs     Status: None   Collection Time: 09/08/19  8:41 AM   Specimen: Nasopharyngeal Swab; Respiratory  Result Value Ref Range Status   SARS-CoV-2, NAA NOT DETECTED NOT DETECTED Final    Comment: (NOTE) This nucleic acid amplification test was developed and its performance characteristics determined by [employer]. Nucleic acid amplification tests include PCR and TMA. This test has not been FDA cleared or approved. This test has been authorized by FDA under an Emergency Use Authorization (EUA). This test is only authorized for the duration of time the declaration that circumstances exist justifying the authorization of the emergency use of in vitro diagnostic tests for detection of SARS-CoV-2 virus and/or diagnosis of COVID-19 infection under section 564(b)(1) of the Act, 21 U.S.C. [id number]) (1), unless the authorization is terminated or revoked sooner. When diagnostic testing is negative, the possibility of a false negative result should be considered in the context of a patient's recent exposures and the presence of clinical signs and symptoms consistent with  COVID-19. An individual without symptoms of COVID- 19 and who is not shedding SARS-CoV-2 vi rus would expect to have a negative (not detected) result in this assay. Performed At: [facility] [address] [place], [place] [id number] Rush Farmer MD [id number]    [place]  Final    Comment: Performed at [facility], [place] [address], [place], [place] [address]  Surgical pcr screen     Status: None   Collection Time: 09/09/19  9:00 AM   Specimen: Nasal Mucosa; Nasal Swab  Result Value Ref Range Status   MRSA, PCR NEGATIVE NEGATIVE Final   Staphylococcus aureus NEGATIVE NEGATIVE Final    Comment: (NOTE) The Xpert SA Assay (FDA approved for NASAL specimens in patients 7 years of age and older), is one component of a comprehensive surveillance program. It is not intended to diagnose infection nor to guide or monitor treatment. Performed at [facility], [place] [address] [place],  [address]   Culture, blood (routine x 2)     Status: None (Preliminary result)   Collection Time:  09/15/19  4:55 PM   Specimen: BLOOD LEFT FOREARM  Result Value Ref Range Status   Specimen Description   Final    BLOOD LEFT FOREARM Performed at [facility], [place] [address] [place], [place] [address]    Special Requests   Final    BOTTLES DRAWN AEROBIC ONLY Blood Culture adequate volume Performed at [facility], [address], [place], [place] [address]    Culture   Final    NO GROWTH 2 DAYS Performed at [place] [address], [place], [place] [address]    Report Status PENDING  Incomplete  Culture, blood (routine x 2)     Status: None (Preliminary result)   Collection Time: 09/15/19  5:10 PM   Specimen: BLOOD LEFT FOREARM  Result Value Ref Range Status   Specimen Description   Final    BLOOD LEFT FOREARM Performed at [facility], [place] [address]  [place], [place] [address]    Special Requests   Final    BOTTLES DRAWN AEROBIC ONLY Blood Culture adequate volume Performed at [facility], [place] [address] [place], [place] [address]    Culture   Final    NO GROWTH 2 DAYS Performed at [place] [address] [place], [place] [address]    Report Status PENDING  Incomplete  Urine culture     Status: None   Collection Time: 09/15/19  6:30 PM   Specimen: Urine, Random  Result Value Ref Range Status   Specimen Description   Final    URINE, RANDOM Performed at [facility], [place] [address], [place], [place] [address]    Special Requests   Final    NONE Performed at [facility], [place] [address], [place], [place] [address]    Culture   Final    NO GROWTH Performed at [facility], [place] [address], [place], [place] [address]    Report Status 09/16/2019 FINAL  Final  SARS CORONAVIRUS 2 (TAT 6-24 HRS) Nasopharyngeal Nasopharyngeal Swab     Status: None   Collection Time: 09/15/19  6:44 PM   Specimen: Nasopharyngeal Swab  Result Value Ref Range Status   SARS Coronavirus 2 NEGATIVE NEGATIVE Final    Comment: (NOTE) SARS-CoV-2 target nucleic acids are NOT DETECTED. The SARS-CoV-2 RNA is generally detectable in upper and lower respiratory specimens during the acute phase of infection. Negative results do not preclude SARS-CoV-2 infection, do not rule out co-infections with other pathogens, and should not be used as the sole basis for treatment or other patient management decisions. Negative results must be combined with clinical observations, patient history, and epidemiological information. The expected result is Negative. Fact Sheet for Patients: SugarRoll.be Fact Sheet for Healthcare Providers: https://www.woods-mathews.com/ This test is not yet approved or cleared by the [place] FDA and  has been  authorized for detection and/or diagnosis of SARS-CoV-2 by FDA under an Emergency Use Authorization (EUA). This EUA will remain  in effect (meaning this test can be used) for the duration of the COVID-19 declaration under Section 56 4(b)(1) of the Act, 21 U.S.C. section 360bbb-3(b)(1), unless the authorization is terminated or revoked sooner. Performed at [facility], [place] [address], [place],  [address]       Radiology Studies: No results found.   Scheduled Meds: . diltiazem  240 mg Oral Daily  . doxycycline  100 mg Oral  Q12H  . enoxaparin (LOVENOX) injection  40 mg Subcutaneous Q24H  . gabapentin  300 mg Oral TID   Continuous Infusions:   Assessment & Plan:   1. Acute encephalopathy: likely toxic encephalopathy in the setting of opiates for post op pain. He is improving at the time of my exam but still somnolent and needs to be monitored as inpatient. Cautious pain med use. Diet if more awake this afternoon.  2. 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 Ortho feels his symptomatology consistent with hemarthrosis/expected post operative course in the setting of Eliquis use. NO evidence of septic knee per Ortho-appreciate their eval, follow recommendations: rest, ice and elevate the knee, hold off on PT for 24 hours.  Resume PT tomorrow.He was originally scheduled to return to the office on 10/6 but Ortho want him to now return on 9/29. Consider inpatient arthrocentesis if symptomatology worsens.  3. Acute blood loss anemia: In the setting of # 2. Hgb downtrending albeit in setting of IV fluids, monitor closely, transfuse prn. Resume Eliquis in am if hgb stable.  4. Recently diagnosed A. fib on Cardizem.  Holding apixaban in concern for #2  5. Low Grade temp/leucocytosis : resolved  now. Could be inflammatory response to #2 vs cellulitis vs aspiration in setting of #1. CXR unremarkable. U/A -ve without any pyuria. Follow blood cx. Narrow abx - to doxycycline. S/p vancomycin/clinda/azactam in ED  6. Hypertension: on ACE inhibitor.  7. History of chronic pain: hold opiates (oxy 5-10 q 6prn) in concern for #1, resume at lower dose/frequence if and when needed. Hold Robaxin. Resumed Neurontin.Watch for signs of withdrawal.   8.  Right leg pain. Lower extremity Doppler ordered. Surgery does not think there is any indication for arthrocentesis and no indication for active infection there.  9.  Intractable nausea and vomiting. We will get x-ray esophagus and DG esophagogram. Currently serum creatinine unremarkable.  We will continue to monitor.  Hydrate if condition worsens.     DVT prophylaxis: Eliquis on hold.  Code Status: Full  Family / Patient Communication: Discussed with patient briefly as somnolent Disposition Plan: SNF     LOS: 2 days    Time spent: 35 minutes    Berle Mull, MD [facility]  If 7PM-7AM, please contact night-coverage [email or web address] Password [facility] 09/17/2019, 8:17 PM

## 2019-09-18 ENCOUNTER — Encounter (HOSPITAL_COMMUNITY): Payer: Self-pay

## 2019-09-18 ENCOUNTER — Inpatient Hospital Stay (HOSPITAL_COMMUNITY): Payer: Medicare HMO

## 2019-09-18 DIAGNOSIS — R609 Edema, unspecified: Secondary | ICD-10-CM

## 2019-09-18 DIAGNOSIS — R52 Pain, unspecified: Secondary | ICD-10-CM

## 2019-09-18 LAB — COMPREHENSIVE METABOLIC PANEL
ALT: 31 U/L (ref 0–44)
AST: 25 U/L (ref 15–41)
Albumin: 2.5 g/dL — ABNORMAL LOW (ref 3.5–5.0)
Alkaline Phosphatase: 71 U/L (ref 38–126)
Anion gap: 9 (ref 5–15)
BUN: 20 mg/dL (ref 8–23)
CO2: 23 mmol/L (ref 22–32)
Calcium: 8.1 mg/dL — ABNORMAL LOW (ref 8.9–10.3)
Chloride: 108 mmol/L (ref 98–111)
Creatinine, Ser: 0.63 mg/dL (ref 0.61–1.24)
GFR calc Af Amer: >60 mL/min (ref >60)
GFR calc non Af Amer: >60 mL/min (ref >60)
Glucose, Bld: 156 mg/dL — ABNORMAL HIGH (ref 70–99)
Potassium: 3.7 mmol/L (ref 3.5–5.1)
Sodium: 140 mmol/L (ref 135–145)
Total Bilirubin: 1 mg/dL (ref 0.3–1.2)
Total Protein: 5.6 g/dL — ABNORMAL LOW (ref 6.5–8.1)

## 2019-09-18 LAB — CBC WITH DIFFERENTIAL/PLATELET
Abs Immature Granulocytes: 0.05 10*3/uL (ref 0.00–0.07)
Basophils Absolute: 0 10*3/uL (ref 0.0–0.1)
Basophils Relative: 0 %
Eosinophils Absolute: 0.1 10*3/uL (ref 0.0–0.5)
Eosinophils Relative: 1 %
HCT: 31.5 % — ABNORMAL LOW (ref 39.0–52.0)
Hemoglobin: 10.1 g/dL — ABNORMAL LOW (ref 13.0–17.0)
Immature Granulocytes: 1 %
Lymphocytes Relative: 12 %
Lymphs Abs: 1.1 10*3/uL (ref 0.7–4.0)
MCH: 30.1 pg (ref 26.0–34.0)
MCHC: 32.1 g/dL (ref 30.0–36.0)
MCV: 93.8 fL (ref 80.0–100.0)
Monocytes Absolute: 1 10*3/uL (ref 0.1–1.0)
Monocytes Relative: 11 %
Neutro Abs: 6.6 10*3/uL (ref 1.7–7.7)
Neutrophils Relative %: 75 %
Platelets: 178 10*3/uL (ref 150–400)
RBC: 3.36 MIL/uL — ABNORMAL LOW (ref 4.22–5.81)
RDW: 13.1 % (ref 11.5–15.5)
WBC: 8.7 10*3/uL (ref 4.0–10.5)
nRBC: 0 % (ref 0.0–0.2)

## 2019-09-18 LAB — GLUCOSE, CAPILLARY
Glucose-Capillary: 122 mg/dL — ABNORMAL HIGH (ref 70–99)
Glucose-Capillary: 130 mg/dL — ABNORMAL HIGH (ref 70–99)
Glucose-Capillary: 131 mg/dL — ABNORMAL HIGH (ref 70–99)

## 2019-09-18 LAB — LACTIC ACID, PLASMA: Lactic Acid, Venous: 1.1 mmol/L (ref 0.5–1.9)

## 2019-09-18 LAB — MAGNESIUM: Magnesium: 2.2 mg/dL (ref 1.7–2.4)

## 2019-09-18 LAB — CK: Total CK: 22 U/L — ABNORMAL LOW (ref 49–397)

## 2019-09-18 LAB — C-REACTIVE PROTEIN: CRP: 17.3 mg/dL — ABNORMAL HIGH (ref <1.0)

## 2019-09-18 LAB — D-DIMER, QUANTITATIVE: D-Dimer, Quant: 1.94 ug/mL-FEU — ABNORMAL HIGH (ref 0.00–0.50)

## 2019-09-18 MED ORDER — METOCLOPRAMIDE HCL 5 MG/ML IJ SOLN
5.0000 mg | Freq: Four times a day (QID) | INTRAMUSCULAR | Status: DC
  Administered 2019-09-18 – 2019-09-20 (×8): 5 mg via INTRAVENOUS
  Filled 2019-09-18 (×7): qty 2

## 2019-09-18 MED ORDER — LACTULOSE 10 GM/15ML PO SOLN
10.0000 g | Freq: Once | ORAL | Status: AC
  Administered 2019-09-18: 10 g via ORAL
  Filled 2019-09-18: qty 15

## 2019-09-18 MED ORDER — POLYETHYLENE GLYCOL 3350 17 G PO PACK
17.0000 g | PACK | Freq: Every day | ORAL | Status: DC
  Administered 2019-09-18 – 2019-09-20 (×3): 17 g via ORAL
  Filled 2019-09-18 (×3): qty 1

## 2019-09-18 MED ORDER — SENNOSIDES-DOCUSATE SODIUM 8.6-50 MG PO TABS
2.0000 | ORAL_TABLET | Freq: Two times a day (BID) | ORAL | Status: DC
  Administered 2019-09-18 – 2019-09-20 (×4): 2 via ORAL
  Filled 2019-09-18 (×4): qty 2

## 2019-09-18 MED ORDER — IOHEXOL 300 MG/ML  SOLN
30.0000 mL | Freq: Once | INTRAMUSCULAR | Status: AC | PRN
  Administered 2019-09-18: 30 mL via ORAL

## 2019-09-18 MED ORDER — SODIUM CHLORIDE (PF) 0.9 % IJ SOLN
INTRAMUSCULAR | Status: AC
  Filled 2019-09-18: qty 50

## 2019-09-18 MED ORDER — IOHEXOL 300 MG/ML  SOLN
100.0000 mL | Freq: Once | INTRAMUSCULAR | Status: AC | PRN
  Administered 2019-09-18: 100 mL via INTRAVENOUS

## 2019-09-18 NOTE — Evaluation (Signed)
Physical Therapy Evaluation Patient Details Name: Mathew Simpson MRN: [id number] DOB: [birth year] Today's Date: 09/18/2019   History of Present Illness  Pt admitted with acute encephalopathy and L TKR hemarthrosis; Pt is s/p R TKR 09/12/19  Clinical Impression  Pt admitted as above and presenting with functional mobility limitations 2* decreased L LE strength/ROM and ongoing L LE and back pain.  Pt would benefit from follow up rehab at SNF level to maximize IND and safety prior to dc home with ltd assist.    Follow Up Recommendations SNF    Equipment Recommendations  None recommended by PT    Recommendations for Other Services       Precautions / Restrictions Precautions Precautions: Fall Restrictions Weight Bearing Restrictions: No Other Position/Activity Restrictions: WBAT      Mobility  Bed Mobility Overal bed mobility: Needs Assistance Bed Mobility: Supine to Sit     Supine to sit: Min assist;+2 for physical assistance;+2 for safety/equipment;HOB elevated     General bed mobility comments: cues for sequence and assist to manage L LE and to bring trunk to upright position  Transfers Overall transfer level: Needs assistance Equipment used: Rolling walker (2 wheeled) Transfers: Sit to/from Stand Sit to Stand: +2 physical assistance;+2 safety/equipment;From elevated surface;Min assist;Mod assist         General transfer comment: cues for LE management and use of UEs to self assist; physical assist to bring wt up and fwd and to balance in intiial standing  Ambulation/Gait Ambulation/Gait assistance: Min assist Gait Distance (Feet): 7 Feet Assistive device: Rolling walker (2 wheeled) Gait Pattern/deviations: Step-to pattern;Trunk flexed;Shuffle;Antalgic Gait velocity: decreased   General Gait Details: cues for sequence, posture and position from LE; distance ltd by pain/nausea  Stairs            Wheelchair Mobility    Modified Rankin (Stroke Patients  Only)       Balance Overall balance assessment: Needs assistance Sitting-balance support: Feet supported;No upper extremity supported Sitting balance-Leahy Scale: Fair     Standing balance support: Bilateral upper extremity supported Standing balance-Leahy Scale: Poor Standing balance comment: pt requires UE support for standing balance                             Pertinent Vitals/Pain Pain Assessment: 0-10 Pain Score: 6  Pain Location: L knee and back with activity Pain Descriptors / Indicators: Aching;Sore;Throbbing Pain Intervention(s): Limited activity within patient's tolerance;Monitored during session;Premedicated before session    Home Living Family/patient expects to be discharged to:: Skilled nursing facility                      Prior Function Level of Independence: Independent with assistive device(s)         Comments: prior to TKR 9/21 pt was IND with AD     Hand Dominance        Extremity/Trunk Assessment   Upper Extremity Assessment Upper Extremity Assessment: Overall WFL for tasks assessed    Lower Extremity Assessment Lower Extremity Assessment: LLE deficits/detail LLE Deficits / Details: Painful with movement and notably swollen and discolored    Cervical / Trunk Assessment Cervical / Trunk Assessment: Normal  Communication   Communication: No difficulties  Cognition Arousal/Alertness: Awake/alert Behavior During Therapy: WFL for tasks assessed/performed Overall Cognitive Status: Within Functional Limits for tasks assessed  General Comments      Exercises     Assessment/Plan    PT Assessment Patient needs continued PT services  PT Problem List Decreased strength;Decreased range of motion;Decreased activity tolerance;Decreased balance;Decreased mobility;Decreased knowledge of use of DME;Pain;Decreased knowledge of precautions       PT Treatment  Interventions DME instruction;Gait training;Stair training;Functional mobility training;Therapeutic activities;Therapeutic exercise;Balance training;Patient/family education    PT Goals (Current goals can be found in the Care Plan section)  Acute Rehab PT Goals Patient Stated Goal: To regain IND PT Goal Formulation: With patient Time For Goal Achievement: 10/01/19 Potential to Achieve Goals: Good    Frequency Min 5X/week   Barriers to discharge Decreased caregiver support Pt lives alone    Co-evaluation               AM-PAC PT "6 Clicks" Mobility  Outcome Measure Help needed turning from your back to your side while in a flat bed without using bedrails?: A Lot Help needed moving from lying on your back to sitting on the side of a flat bed without using bedrails?: A Lot Help needed moving to and from a bed to a chair (including a wheelchair)?: A Lot Help needed standing up from a chair using your arms (e.g., wheelchair or bedside chair)?: A Lot Help needed to walk in hospital room?: A Lot Help needed climbing 3-5 steps with a railing? : Total 6 Click Score: 11    End of Session Equipment Utilized During Treatment: Gait belt Activity Tolerance: Patient limited by pain;Other (comment)(nausea) Patient left: in chair;with call bell/phone within reach;with chair alarm set;Other (comment)(Dr in room) Nurse Communication: Mobility status PT Visit Diagnosis: Unsteadiness on feet (R26.81);Difficulty in walking, not elsewhere classified (R26.2);Pain Pain - Right/Left: Left Pain - part of body: Knee    Time: [id number] PT Time Calculation (min) (ACUTE ONLY): 20 min   Charges:   PT Evaluation $PT Eval Low Complexity: 1 Low          [place] Pager [phone] Office [phone]   Ratasha Fabre 09/18/2019, 4:09 PM

## 2019-09-18 NOTE — Progress Notes (Signed)
Bilateral lower extremity venous duplex completed. Preliminary results in Chart review CV Proc. [facility], [place] 09/18/2019, 10:29 AM

## 2019-09-18 NOTE — Progress Notes (Addendum)
Subjective:     Patient reports pain as moderate.  Reports that his knee pain isn't really improved compared to yesterday.  States that he continues to struggle with nausea intermittently.  Reports that he is frustrated that he isn't getting his hydrocodone as quickly as he would like.  He is on hydrocodone 7.5mg  3-4 times per day for chronic LBP.  Objective:   VITALS:  Temp:  [98.3 F (36.8 C)-98.5 F (36.9 C)] 98.5 F (36.9 C) (09/27 0617) Pulse Rate:  [81-91] 91 (09/27 0617) Resp:  [20-22] 22 (09/27 0617) BP: (131-136)/(82-83) 131/83 (09/27 0617) SpO2:  [97 %-99 %] 98 % (09/27 0617)  General: WDWN patient in NAD. Psych:  Appropriate mood and affect. Neuro:  A&O x 3, Moving all extremities, sensation intact to light touch HEENT:  EOMs intact Chest:  Even non-labored respirations Skin:  Incision C/D/I, no rashes or lesions Extremities: warm/dry, moderate edema, mild erythema and echymosis to L knee.  No lymphadenopathy. Pulses: Popliteus 2+ MSK:  ROM: lacks 10 degrees TKE, MMT: able to perform quad set, (-) Homan's    LABS Recent Labs    09/15/19 1710 09/16/19 0604 09/17/19 0528 09/18/19 0442  HGB 11.8* 10.1* 9.7* 10.1*  WBC 11.6* 8.4 8.3 8.7  PLT 168 149* 163 178   Recent Labs    09/17/19 0528 09/18/19 0442  NA 140 140  K 3.3* 3.7  CL 108 108  CO2 22 23  BUN 20 20  CREATININE 0.66 0.63  GLUCOSE 130* 156*   No results for input(s): LABPT, INR in the last 72 hours.   Assessment/Plan:     POD #6 S/P L TKR  Patient seen in rounds for Dr. Wynelle Link. WBAT L LE Up with therapy today WBC remains low. Medicine team managing other medical problems.    Mechele Claude PA-C [facility] Office:  [phone]

## 2019-09-18 NOTE — Progress Notes (Signed)
PROGRESS NOTE    Mathew Simpson  [id number]  DOB: [birth year]  DOA: 09/15/2019 PCP: Gwendel Hanson  Brief Narrative:  83 y.o. male with history of recently diagnosed A. fib, hypertension who underwent total left knee replacement on 09/12/19 and was discharged home 2 days ago with resumption of chronic anticoagulation, brought in by family due to concern for altered mental status/confusion with hallucinations and not ambulating. Given the symptoms initially his pain medication were discontinued but still patient was confused and was brought to the ER.  Per daughter patient did not have any chest pain shortness of breath nausea vomiting or diarrhea.  Has not been eating well for last 1 day. ED Course: In the ER patient initially appeared confused and febrile with a temperature of 100.2 F chest x-ray shows some cardiomegaly and congestion.  UA was unremarkable.  On exam patient's left knee looks inflamed and erythematous.  Concerning for septic knee.  Patient orthopedic surgeon Dr. Elmyra Ricks was notified.  Patient was started on empiric antibiotic after obtaining blood cultures.  Patient labs show WBC count of 11.6 glucose 178 sodium 134 albumin 3.4 hemoglobin 11.8 platelets 168 COVID-19 test was negative.  Subjective:  Continues to have left leg pain although improving.  No nausea no vomiting.  No fever no chills.  No chest pain but has mild diffuse abdominal pain.  Reports nausea which is persistent.  Not passing gas.   Objective: Vitals:   09/17/19 2132 09/18/19 0617 09/18/19 0942 09/18/19 1310  BP: 132/83 131/83 135/78 (!) 144/92  Pulse: 87 91  95  Resp: 20 (!) 22  (!) 22  Temp: 98.3 F (36.8 C) 98.5 F (36.9 C)  98.5 F (36.9 C)  TempSrc: Oral Oral  Oral  SpO2: 97% 98%  96%  Weight:      Height:        Intake/Output Summary (Last 24 hours) at 09/18/2019 1732 Last data filed at 09/18/2019 0800 Gross per 24 hour  Intake --  Output 1075 ml  Net -1075 ml   Filed Weights    09/16/19 1336  Weight: 79.4 kg    Physical Examination:  General exam: Appears somnolent but comfortable  Respiratory system: Clear to auscultation. Respiratory effort normal. Cardiovascular system: S1 & S2 heard, RRR. No JVD, murmurs, rubs, gallops or clicks. No pedal edema. Gastrointestinal system: Abdomen is nondistended, soft and nontender. No organomegaly or masses felt. Normal bowel sounds heard. Central nervous system: Somnolent. No focal neurological deficits. Extremities: Left knee swelling/redness as shown below.  Skin: Significant erythema on the left leg now appears to be mildly progressive.  Psychiatry: Judgement and insight could not be tested in detail. Mood & affect appropriate.       Data Reviewed: I have personally reviewed following labs and imaging studies  CBC: Recent Labs  Lab 09/13/19 0246 09/15/19 1710 09/16/19 0604 09/17/19 0528 09/18/19 0442  WBC 10.7* 11.6* 8.4 8.3 8.7  NEUTROABS  --  9.6*  --   --  6.6  HGB 11.2* 11.8* 10.1* 9.7* 10.1*  HCT 34.3* 35.3* 30.9* 29.5* 31.5*  MCV 93.2 91.5 92.8 92.5 93.8  PLT 181 168 149* 163 0000000   Basic Metabolic Panel: Recent Labs  Lab 09/13/19 0246 09/15/19 1710 09/16/19 0604 09/17/19 0528 09/18/19 0442  NA 136 134* 138 140 140  K 4.2 3.5 3.6 3.3* 3.7  CL 103 99 107 108 108  CO2 26 24 24 22 23   GLUCOSE 187* 178* 139* 130* 156*  BUN 21  21 21 20 20   CREATININE 0.77 0.90 0.71 0.66 0.63  CALCIUM 8.4* 8.2* 8.0* 8.0* 8.1*  MG  --   --   --   --  2.2   GFR: Estimated Creatinine Clearance: 70.1 mL/min (by C-G formula based on SCr of 0.63 mg/dL). Liver Function Tests: Recent Labs  Lab 09/15/19 1710 09/18/19 0442  AST 27 25  ALT 27 31  ALKPHOS 63 71  BILITOT 1.6* 1.0  PROT 6.3* 5.6*  ALBUMIN 3.4* 2.5*   No results for input(s): LIPASE, AMYLASE in the last 168 hours. No results for input(s): AMMONIA in the last 168 hours. Coagulation Profile: No results for input(s): INR, PROTIME in the last 168  hours. Cardiac Enzymes: Recent Labs  Lab 09/18/19 0442  CKTOTAL 22*   BNP (last 3 results) No results for input(s): PROBNP in the last 8760 hours. HbA1C: No results for input(s): HGBA1C in the last 72 hours. CBG: Recent Labs  Lab 09/17/19 1220 09/17/19 1652 09/18/19 0016 09/18/19 0616 09/18/19 1202  GLUCAP 137* 131* 122* 130* 131*   Lipid Profile: No results for input(s): CHOL, HDL, LDLCALC, TRIG, CHOLHDL, LDLDIRECT in the last 72 hours. Thyroid Function Tests: No results for input(s): TSH, T4TOTAL, FREET4, T3FREE, THYROIDAB in the last 72 hours. Anemia Panel: No results for input(s): VITAMINB12, FOLATE, FERRITIN, TIBC, IRON, RETICCTPCT in the last 72 hours. Sepsis Labs: Recent Labs  Lab 09/18/19 0442  LATICACIDVEN 1.1    Recent Results (from the past 240 hour(s))  Surgical pcr screen     Status: None   Collection Time: 09/09/19  9:00 AM   Specimen: Nasal Mucosa; Nasal Swab  Result Value Ref Range Status   MRSA, PCR NEGATIVE NEGATIVE Final   Staphylococcus aureus NEGATIVE NEGATIVE Final    Comment: (NOTE) The Xpert SA Assay (FDA approved for NASAL specimens in patients 77 years of age and older), is one component of a comprehensive surveillance program. It is not intended to diagnose infection nor to guide or monitor treatment. Performed at [facility], [place] [address], [place], [place] [address]   Culture, blood (routine x 2)     Status: None (Preliminary result)   Collection Time: 09/15/19  4:55 PM   Specimen: BLOOD LEFT FOREARM  Result Value Ref Range Status   Specimen Description   Final    BLOOD LEFT FOREARM Performed at [facility], [place] [address], [place], [place] [address]    Special Requests   Final    BOTTLES DRAWN AEROBIC ONLY Blood Culture adequate volume Performed at [facility], [address] [place], [place] [address]    Culture   Final    NO GROWTH 3 DAYS Performed  at [facility], [place] [address], [place], [place] [address]    Report Status PENDING  Incomplete  Culture, blood (routine x 2)     Status: None (Preliminary result)   Collection Time: 09/15/19  5:10 PM   Specimen: BLOOD LEFT FOREARM  Result Value Ref Range Status   Specimen Description   Final    BLOOD LEFT FOREARM Performed at [facility], [place] [address] [place], [place] [address]    Special Requests   Final    BOTTLES DRAWN AEROBIC ONLY Blood Culture adequate volume Performed at [facility], [place] [address], [place], [place] [address]    Culture   Final    NO GROWTH 3 DAYS Performed at [facility], [address] [place], [place] [address]    Report Status PENDING  Incomplete  Urine culture     Status: None   Collection Time: 09/15/19  6:30 PM   Specimen: Urine, Random  Result Value Ref Range Status   Specimen Description   Final    URINE, RANDOM Performed at [facility], [place] [address], [place], [place] [address]    Special Requests   Final    NONE Performed at [facility], [place] [address] [place], [place] [address]    Culture   Final    NO GROWTH Performed at [facility], [place] [address] [place], [place] [address]    Report Status 09/16/2019 FINAL  Final  SARS CORONAVIRUS 2 (TAT 6-24 HRS) Nasopharyngeal Nasopharyngeal Swab     Status: None   Collection Time: 09/15/19  6:44 PM   Specimen: Nasopharyngeal Swab  Result Value Ref Range Status   SARS Coronavirus 2 NEGATIVE NEGATIVE Final    Comment: (NOTE) SARS-CoV-2 target nucleic acids are NOT DETECTED. The SARS-CoV-2 RNA is generally detectable in upper and lower respiratory specimens during the acute phase of infection. Negative results do not preclude SARS-CoV-2 infection, do not rule out co-infections with other pathogens, and should not be used as the sole basis for treatment or other patient management  decisions. Negative results must be combined with clinical observations, patient history, and epidemiological information. The expected result is Negative. Fact Sheet for Patients: SugarRoll.be Fact Sheet for Healthcare Providers: https://www.woods-mathews.com/ This test is not yet approved or cleared by the [place] FDA and  has been authorized for detection and/or diagnosis of SARS-CoV-2 by FDA under an Emergency Use Authorization (EUA). This EUA will remain  in effect (meaning this test can be used) for the duration of the COVID-19 declaration under Section 56 4(b)(1) of the Act, 21 U.S.C. section 360bbb-3(b)(1), unless the authorization is terminated or revoked sooner. Performed at [facility], [place] [address], [place],  [address]       Radiology Studies: Ct Abdomen Pelvis W Contrast  Result Date: 09/18/2019 CLINICAL DATA:  abd pain , eval for bowel obstruction Bowel obstruction, low-grade EXAM: CT ABDOMEN AND PELVIS WITH CONTRAST TECHNIQUE: Multidetector CT imaging of the abdomen and pelvis was performed using the standard protocol following bolus administration of intravenous contrast. CONTRAST:  28mL OMNIPAQUE IOHEXOL 300 MG/ML SOLN, 15mL OMNIPAQUE IOHEXOL 300 MG/ML SOLN COMPARISON:  100 mL Omnipaque 300 FINDINGS: Lower chest: Lung bases are clear. Hepatobiliary: Small cyst in the RIGHT hepatic lobe. No gallbladder abnormality. Enhancing 10 mm lesion in the RIGHT hepatic lobe (segment 8) is favored benign vascular lesion (image 19/2). Pancreas: Pancreas is normal. No ductal dilatation. No pancreatic inflammation. Spleen: Normal spleen Adrenals/urinary tract: Adrenal glands normal. Multiple simple fluid attenuation renal cysts. Ureters and bladder normal Stomach/Bowel: Stomach, small bowel cecum normal. appendix is prominent at 10 mm (image 87/5) however there is no periappendiceal inflammation. Ascending, transverse descending  colon normal. No bowel obstruction. No bowel inflammation. Vascular/Lymphatic: Abdominal aorta is normal caliber with atherosclerotic calcification. There is no retroperitoneal or periportal lymphadenopathy. No pelvic lymphadenopathy. Reproductive: Prostate normal. Other: Bilateral fat filled inguinal hernias. Musculoskeletal: No aggressive osseous lesion. IMPRESSION: 1. No acute findings in the abdomen pelvis. 2. No evidence of bowel obstruction or bowel inflammation. 3. Appendix is prominent but there is no evidence of para appendiceal inflammation to suggest acute appendicitis. 4. Small enhancing lesion the RIGHT hepatic lobe. In the absence  of malignancy favored benign vascular lesion. 5. Multiple benign-appearing renal cysts. 6. Moderate volume stool in the rectosigmoid colon. Electronically Signed   By: Suzy Bouchard M.D.   On: 09/18/2019 17:02   Dg Abd Portable 1v  Result Date: 09/17/2019 CLINICAL DATA:  Intractable nausea and vomiting EXAM: PORTABLE ABDOMEN - 1 VIEW COMPARISON:  None. FINDINGS: Multiple borderline distended loops of small bowel in the mid abdomen and right upper quadrant. Air and stool projects over the colon and rectal vault. Few punctate calcifications present in the right upper quadrant and left upper quadrant, not clearly confined to the renal shadows or gallbladder fossa, may reflect ingested material within the bowel. Evaluation for free intraperitoneal air is limited on this supine only film. Extensive degenerative changes are present in the spine and pelvis. Mild levocurvature of the spine is noted as well. IMPRESSION: Multiple borderline distended loops of small bowel in the mid abdomen and right upper quadrant, nonspecific. Findings could reflect early/developing small bowel obstruction or ileus. Electronically Signed   By: Lovena Le M.D.   On: 09/17/2019 21:26   Vas [place] Lower Extremity Venous (dvt)  Result Date: 09/18/2019  Lower Venous Study Indications: Pain,  Swelling, and Erythema.  Risk Factors: Surgery recent left TKA. Comparison Study: No recent study available. Performing Technologist: Toma Copier RVS  Examination Guidelines: A complete evaluation includes B-mode imaging, spectral Doppler, color Doppler, and power Doppler as needed of all accessible portions of each vessel. Bilateral testing is considered an integral part of a complete examination. Limited examinations for reoccurring indications may be performed as noted.  +---------+---------------+---------+-----------+----------+--------------+  RIGHT     Compressibility Phasicity Spontaneity Properties Thrombus Aging  +---------+---------------+---------+-----------+----------+--------------+  CFV       Full            Yes       Yes                                    +---------+---------------+---------+-----------+----------+--------------+  SFJ       Full                                                             +---------+---------------+---------+-----------+----------+--------------+  FV Prox   Full            Yes       Yes                                    +---------+---------------+---------+-----------+----------+--------------+  FV Mid    Full                                                             +---------+---------------+---------+-----------+----------+--------------+  FV Distal Full            Yes       Yes                                    +---------+---------------+---------+-----------+----------+--------------+  PFV       Full            Yes       Yes                                    +---------+---------------+---------+-----------+----------+--------------+  POP       Full            Yes       Yes                                    +---------+---------------+---------+-----------+----------+--------------+  PTV       Full                                                             +---------+---------------+---------+-----------+----------+--------------+  PERO      Full                                                              +---------+---------------+---------+-----------+----------+--------------+   +---------+---------------+---------+-----------+----------+--------------+  LEFT      Compressibility Phasicity Spontaneity Properties Thrombus Aging  +---------+---------------+---------+-----------+----------+--------------+  CFV       Full            Yes       Yes                                    +---------+---------------+---------+-----------+----------+--------------+  SFJ       Full                                                             +---------+---------------+---------+-----------+----------+--------------+  FV Prox   Full            Yes       Yes                                    +---------+---------------+---------+-----------+----------+--------------+  FV Mid    Full                                                             +---------+---------------+---------+-----------+----------+--------------+  FV Distal Full            Yes       Yes                                    +---------+---------------+---------+-----------+----------+--------------+  PFV       Full            Yes       Yes                                    +---------+---------------+---------+-----------+----------+--------------+  POP       Full            Yes       Yes                                    +---------+---------------+---------+-----------+----------+--------------+  PTV       Full                                                             +---------+---------------+---------+-----------+----------+--------------+  PERO      Full                                                             +---------+---------------+---------+-----------+----------+--------------+     Summary: Right: There is no evidence of deep vein thrombosis in the lower extremity. No cystic structure found in the popliteal fossa. Left: There is no evidence of deep vein thrombosis in the lower extremity. No  cystic structure found in the popliteal fossa.  *See table(s) above for measurements and observations.    Preliminary      Scheduled Meds:  diltiazem  240 mg Oral Daily   doxycycline  100 mg Oral Q12H   gabapentin  300 mg Oral TID   lactulose  10 g Oral Once   polyethylene glycol  17 g Oral Daily   senna-docusate  2 tablet Oral BID   sodium chloride (PF)       Continuous Infusions:   Assessment & Plan:   1. Acute encephalopathy: likely toxic encephalopathy in the setting of opiates for post op pain. He is improving at the time of my exam but still somnolent and needs to be monitored as inpatient. Cautious pain med use. Diet if more awake this afternoon.  2. 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 Ortho feels his symptomatology consistent with hemarthrosis/expected post operative course in the setting of Eliquis use. NO evidence of septic knee per Ortho-appreciate their eval, follow recommendations: rest, ice and elevate the knee, PT eval today.He was originally scheduled to return to the office on 10/6 but Ortho want him to now return on 9/29. Consider inpatient arthrocentesis if symptomatology worsens.  3. Acute blood loss anemia: In the setting of # 2. Hgb initially downtrending.  Now stable.  Monitor.  4. Recently diagnosed A. fib on Cardizem.  Holding apixaban in concern for #2  5. Low Grade temp/leucocytosis : resolved now. Could be inflammatory response to #2 vs cellulitis vs aspiration in setting of #1. CXR unremarkable.  U/A -ve without any pyuria. Follow blood cx. Narrow abx - to doxycycline. S/p vancomycin/clinda/azactam in ED  6. Hypertension: on ACE inhibitor.  7. History of chronic pain: hold opiates (oxy 5-10 q 6prn) in concern for #1, resume at lower dose/frequence if and when  needed. Hold Robaxin. Resumed Neurontin.Watch for signs of withdrawal.   8.  Right leg pain. Lower extremity Doppler ordered. Surgery does not think there is any indication for arthrocentesis and no indication for active infection there.  9.  Intractable nausea and vomiting. We will get x-ray esophagus and DG esophagogram. Currently serum creatinine unremarkable.  We will continue to monitor.  Hydrate if condition worsens. X-ray showed evidence of possible small bowel obstruction. CT abdomen was performed which ruled out small bowel obstruction and show significant stool burden. We will provide stool softeners.    DVT prophylaxis: Eliquis on hold.  Code Status: Full  Family / Patient Communication: Discussed with patient briefly as somnolent Disposition Plan: SNF     LOS: 3 days    Time spent: 35 minutes    Berle Mull, MD [facility]  If 7PM-7AM, please contact night-coverage [email or web address] Password [facility] 09/18/2019, 5:32 PM

## 2019-09-19 DIAGNOSIS — L03116 Cellulitis of left lower limb: Secondary | ICD-10-CM

## 2019-09-19 NOTE — TOC Progression Note (Signed)
Transition of Care [facility]) - Progression Note    Patient Details  Name: Mathew Simpson MRN: [id number] Date of Birth: 05/17/1936  Transition of Care [facility]) CM/SW Contact  Glendia Olshefski, Juliann Pulse, RN Phone Number: 09/19/2019, 1:17 PM  Clinical Narrative:   09/19/2019 Medicare Nursing Home Results [email or web address], [id number]&sort=19ASC&paging=131 1/8 Close window 31 hospitals within 25 miles from the center of [place], [place]. Nursing Home Search Results Results List Table Nursing Home Information Overall Rating Health Inpections Staffing Quality Ratings [facility] [place], [place] [address] [phone] Below Average Below Average Below Average Below Average [place], [place] [address] [phone] Below Average Below Average Below Average Average 2 out of 5 stars 2 out of 5 stars 2 out of 5 stars 2 out of 5 stars 2 out of 5 stars 2 out of 5 stars 2 out of 5 stars 3 out of 5 stars 09/19/2019 Medicare Nursing Home Results [email or web address], [email or web address]&sort=19ASC&paging=131 2/8 [place], [place] [address] [phone] Much Above Average Much Above Average Average Below Average [facility] [address] [place], [place] [address] [phone] Below Average Below Average Below Average Average [place], [place] [address] [phone] Much Above Average Above Average Much Above Average Much Above Average [place] [facility] [place], [place] [address] [phone] Much Below Average Much Below Average Below Average Below Average 5 out of 5 stars 5 out of 5 stars 3 out of 5 stars 2 out of 5 stars 2 out of 5 stars 2 out of 5 stars 2 out of 5 stars 3 out of 5 stars 5 out of 5 stars 4 out of 5 stars 5 out of 5 stars 5 out of 5 stars 1 out of 5 stars 1 out of 5 stars 2 out of 5 stars 2 out of 5 stars 09/19/2019 Medicare Nursing Home Results [email or web address], [id number]&sort=19ASC&paging=131 3/8 [place] Inpections Staffing Quality Ratings [place] [address] [place], [place] [address] [phone] Much Below Average Much Below Average Below Average Much Below Average [place], [place] [address] [phone] Much Below Average Much Below Average Below Average Below Average [place], [place] [address] [phone] Below Average Below Average Below Average Above Average [facility] [place], [place] [address] [phone] Much Above Average Much Above Average Much Above Average Much Above Average 1 out of 5 stars 1 out of 5 stars 2 out of 5 stars 1 out of 5 stars 1 out of 5 stars 1 out of 5 stars 2 out of 5 stars 2 out of 5 stars 2 out of 5 stars 2 out of 5 stars 2 out of 5 stars 4 out of 5 stars 5 out of 5 stars 5 out of 5 stars 5 out of 5 stars 5 out of 5 stars 09/19/2019 Medicare Nursing Home Results [email or web address], [id number]&sort=19ASC&paging=131 4/8 [place] [address] [place], [place] [address] [phone] Below Average Below Average Below Average Average [facility] [place], [place] [address] [phone] Much Above Average Above Average Much Above Average Much Above Average [facility] [place], [place] [address] [phone] Average Average Below Average Average [facility] [place], [place] [address] [phone] Much Below Average Much Below Average Below Average Average [place], [place] [address] [phone] Above Average Above Average Below Average Above Average 2 out of 5 stars 2 out of 5 stars 2 out of 5 stars 3 out of 5 stars 5 out of 5 stars 4 out of 5 stars 5 out of 5 stars 5 out of 5 stars 3 out of 5 stars 3 out of 5 stars 2 out of 5 stars 3 out of 5 stars 1 out of 5 stars 1 out of 5 stars 2 out of 5 stars 3 out of 5 stars 4 out of 5 stars 4 out of 5 stars 2 out of 5 stars 4 out of 5 stars 09/19/2019 Medicare Nursing Home Results [email or web address], [id number]&sort=19ASC&paging=131 5/8 Nursing Home Information Overall Rating Health Inpections Staffing Quality Ratings THE [facility] [address] [place], [place] [address] [phone] Below Average Below Average Below Average Below Average [place], [place] [address] [phone] Much Above Average Much Above Average Below Average Much Above Average [facility] [address] [place], [place] [id number] [phone] Much Above Average Much Above Average Above Average Much Above [place], [place] [address] [phone] Much Below Average Much  Below Average Below Average Average [place], [place] [address] [phone] Much Below Average Much Below Average Much Below Average Average 2 out of 5 stars 2 out of 5 stars 2 out of 5 stars 2 out of 5 stars 5 out of 5 stars 5 out of 5 stars 2 out of 5 stars 5 out of 5 stars 5 out of 5 stars 5 out of 5 stars 4 out of 5 stars 5 out of 5 stars 1 out of 5 stars 1 out of 5 stars 2 out of 5 stars 3 out of 5 stars 1 out of 5 stars 1 out of 5 stars 1 out of 5 stars 3 out of 5 stars 09/19/2019 Medicare Nursing Home Results [email or web address], [id number]&sort=19ASC&paging=131 6/8 Nursing Home Information Overall Rating Health Inpections Staffing Quality Ratings [facility] [place], [place] [address] [phone] Much Below Average Much Below Average Below Average Below Average [facility] [address] [place], [place] [address] [phone] Above Average Average Below Average Much Above Average [facility] [address] [place], [place] [address] [phone] Below Average Much Below Average Above Average Average [facility] [place], [place] [address] [phone] Much Above Average Above Average Much Above Average Much Above Average [facility] [address] [place], [place] [address] [phone] Much Above Average Above Average Much Above Average Much Above Average [facility] [address] [place],  [id number] [phone] Below Average Below Average Below Average Below Average 1 out of 5 stars 1 out of 5 stars 2 out of 5 stars 2 out of 5 stars 4 out of 5 stars 3 out of 5 stars 2 out of 5 stars 5 out of 5 stars 2 out  of 5 stars 1 out of 5 stars 4 out of 5 stars 3 out of 5 stars 5 out of 5  stars 4 out of 5 stars 5 out of 5 stars 5 out of 5 stars 5 out of 5 stars 4 out of 5 stars 5 out of 5 stars 5 out of 5 stars 2 out of 5 stars 2 out of 5 stars 2 out of 5 stars 2 out of 5 stars 09/19/2019 Medicare Nursing Home Results [email or web address], [id number]&sort=19ASC&paging=131 7/8 [place] Staffing Quality Ratings [place] [address] [place], [place] [address] [phone] Much Below Average Much Below Average Average Above Average [facility] [place], [place] [address] [phone] Average Average Below Average Average [facility] [place], [place] [address] [phone] Average Average Below Average Below Average [facility] [place] [facility] [place], [place] [address] [phone] Above Average Above Average Above Average Above Average 1 out of 5 stars 1 out of 5 stars 3 out of 5 stars 4 out of 5 stars 3 out of 5 stars 3 out of 5 stars 2 out of 5 stars 3 out of 5 stars 3 out of 5 stars 3 out of 5 stars 2 out of 5 stars 2 out of 5 stars 4 out of 5 stars 4 out of 5 stars 4 out of 5 stars 4 out of 5 stars 09/19/2019 Medicare Nursing Home Results [email or web address], [id number]&sort=19ASC&paging=131 8/8 [place] [address] [place],  [address] [phone] Much Below Average Much Below Average Not Available12 Below Average NursingHomeCompare Footnotes Footnote number Footnote as displayed on nursing home Compare 1 Newly certified nursing home with less than 12-15 months  of data available or the nursing opened less than 6 months ago, and there were no data to submit or claims for this measure. 2 Not enough data available to calculate a star rating. 6 This facility did not submit staffing data, or submitted data that did not meet the criteria required to calculate a staffing measure. 7 CMS determined that the percentage was not accurate or data suppressed by CMS for one or more quarters. 9 The number of residents or resident stays is too small to report. Call the facility to discuss this quality measure. 10 The data for this measure is missing or was not submitted. Call the facility to discuss this quality measure. 12 This facility either did not submit staffing data, has reported a high number of days without a registered nurse onsite, or submitted data that could not be verified through an audit. 13 Results are based on a shorter time period than required. 14 This nursing home is not required to submit data for the [place] Reporting Program. 18 This facility is not rated due to a history of serious quality issues and is included in the special focus facility program. 1 out of 5 stars 1 out of 5 stars 2 out of 5 stars    Expected Discharge Plan: [place] Barriers to Discharge: Continued Medical Work up  Expected Discharge Plan and Services Expected Discharge Plan: [place]   Discharge Planning Services: CM Consult     Expected Discharge Date: (unknown)  Social Determinants of Health (SDOH) Interventions    Readmission Risk Interventions No flowsheet data found.

## 2019-09-19 NOTE — Progress Notes (Signed)
Subjective: Mr. Wolle states that his knee is feeling better today   Objective: Vital signs in last 24 hours: Temp:  [98.2 F (36.8 C)-98.5 F (36.9 C)] 98.3 F (36.8 C) (09/28 0518) Pulse Rate:  [86-96] 86 (09/28 0518) Resp:  [20-22] 20 (09/28 0518) BP: (135-149)/(78-92) 139/86 (09/28 0518) SpO2:  [96 %-99 %] 99 % (09/28 0518)  Intake/Output from previous day: 09/27 0701 - 09/28 0700 In: 120 [P.O.:120] Out: 850 [Urine:850] Intake/Output this shift: Total I/O In: -  Out: 200 [Urine:200]  Recent Labs    09/17/19 0528 09/18/19 0442  HGB 9.7* 10.1*   Recent Labs    09/17/19 0528 09/18/19 0442  WBC 8.3 8.7  RBC 3.19* 3.36*  HCT 29.5* 31.5*  PLT 163 178   Recent Labs    09/17/19 0528 09/18/19 0442  NA 140 140  K 3.3* 3.7  CL 108 108  CO2 22 23  BUN 20 20  CREATININE 0.66 0.63  GLUCOSE 130* 156*  CALCIUM 8.0* 8.1*   No results for input(s): LABPT, INR in the last 72 hours.  Neurologically intact No cellulitis present Compartment soft Swelling has decreased significantly since admission. Has bruising throughout lower leg    Assessment/Plan: Left TKA- His knee continues to improve. PT resumed yesterday. SNF per discussion with family on Friday   Gaynelle Arabian 09/19/2019, 8:27 AM

## 2019-09-19 NOTE — NC FL2 (Signed)
[place] LEVEL OF CARE SCREENING TOOL     IDENTIFICATION  Patient Name: Mathew Simpson Birthdate: 1936/02/21 Sex: male Admission Date (Current Location): 09/15/2019  [facility] and [place] Number:  Herbalist and Address:  [facility],  [place], [place]      Provider Number: [id number]  Attending Physician Name and Address:  Georgette Shell, MD  Relative Name and Phone Number:  Johnanna Schneiders [phone]    Current Level of Care: Hospital Recommended Level of Care: [place] Prior Approval Number:    Date Approved/Denied:   PASRR Number: [id number]  Discharge Plan: SNF    Current Diagnoses: Patient Active Problem List   Diagnosis Date Noted  . Cellulitis of left lower extremity   . Septic joint of left knee joint ([place]) 09/15/2019  . Acute blood loss anemia 09/15/2019  . Essential hypertension 09/15/2019  . OA (osteoarthritis) of knee 09/12/2019    Orientation RESPIRATION BLADDER Height & Weight     Self, Time, Situation, Place  Normal Continent Weight: 79.4 kg Height:  5\' 8"  (172.7 cm)  BEHAVIORAL SYMPTOMS/MOOD NEUROLOGICAL BOWEL NUTRITION STATUS      Continent Diet(Currently fulls to advance to regular)  AMBULATORY STATUS COMMUNICATION OF NEEDS Skin   Limited Assist Verbally Surgical wounds(L TKR)                       Personal Care Assistance Level of Assistance  Bathing, Feeding, Dressing Bathing Assistance: Limited assistance Feeding assistance: Limited assistance Dressing Assistance: Limited assistance     Functional Limitations Info  Sight, Hearing, Speech Sight Info: Adequate Hearing Info: Adequate Speech Info: Adequate    SPECIAL CARE FACTORS FREQUENCY  PT (By licensed PT), OT (By licensed OT)     PT Frequency: 5x week OT Frequency: 5x week            Contractures Contractures Info: Not present    Additional Factors Info  Code Status, Allergies Code Status  Info: Full code Allergies Info: Penicillins           Current Medications (09/19/2019):  This is the current hospital active medication list Current Facility-Administered Medications  Medication Dose Route Frequency Provider Last Rate Last Dose  . acetaminophen (TYLENOL) tablet 650 mg  650 mg Oral Q6H PRN Rise Patience, MD   650 mg at 09/17/19 [id number]   Or  . acetaminophen (TYLENOL) suppository 650 mg  650 mg Rectal Q6H PRN Rise Patience, MD      . diltiazem (CARDIZEM CD) 24 hr capsule 240 mg  240 mg Oral Daily Rise Patience, MD   240 mg at 09/19/19 0951  . doxycycline (VIBRA-TABS) tablet 100 mg  100 mg Oral Q12H [place] Shi, MD   100 mg at 09/19/19 0951  . gabapentin (NEURONTIN) capsule 300 mg  300 mg Oral TID Rise Patience, MD   300 mg at 09/19/19 0950  . HYDROcodone-acetaminophen (NORCO) 7.5-325 MG per tablet 1 tablet  1 tablet Oral Q6H PRN Netta Cedars, MD   1 tablet at 09/19/19 1026  . metoCLOPramide (REGLAN) injection 5 mg  5 mg Intravenous Q6H Lavina Hamman, MD   5 mg at 09/19/19 0557  . ondansetron (ZOFRAN) tablet 4 mg  4 mg Oral Q6H PRN Rise Patience, MD       Or  . ondansetron [facility]) injection 4 mg  4 mg Intravenous Q6H PRN Rise Patience, MD  4 mg at 09/19/19 1020  . polyethylene glycol (MIRALAX / GLYCOLAX) packet 17 g  17 g Oral Daily Lavina Hamman, MD   17 g at 09/19/19 0951  . senna-docusate (Senokot-S) tablet 2 tablet  2 tablet Oral BID Lavina Hamman, MD   2 tablet at 09/19/19 [id number]     Discharge Medications: Please see discharge summary for a list of discharge medications.  Relevant Imaging Results:  Relevant Lab Results:   Additional Information ss#[id number], [place], [place]

## 2019-09-19 NOTE — TOC Progression Note (Signed)
Transition of Care [facility]) - Progression Note    Patient Details  Name: Mathew Simpson MRN: [id number] Date of Birth: 1936-06-29  Transition of Care [facility]) CM/SW Contact  Rechel Delosreyes, Juliann Pulse, RN Phone Number: 09/19/2019, 2:11 PM  Clinical Narrative: [facility] unable to accept-still awaiting choice.      Expected Discharge Plan: Skilled Nursing Facility Barriers to Discharge: Continued Medical Work up  Expected Discharge Plan and Services Expected Discharge Plan: [place]   Discharge Planning Services: CM Consult     Expected Discharge Date: (unknown)                                     Social Determinants of Health (SDOH) Interventions    Readmission Risk Interventions No flowsheet data found.

## 2019-09-19 NOTE — Progress Notes (Signed)
Physical Therapy Treatment Patient Details Name: Mathew Simpson MRN: [id number] DOB: Dec 07, 1936 Today's Date: 09/19/2019    History of Present Illness Pt admitted with acute encephalopathy and L TKR hemarthrosis; Pt is s/p R TKR 09/12/19    PT Comments    Called to room by NT to assist pt back to bed.  Instructed to use belt loop to self assist L LE from elevated recliner to floor.  Pt required + 2 side by side assist to stand, pivot and sit on bed.  Very unsteady.  Required increased time.  Assisted from sit to supine and positioned to comfort.  Re applied ICE. Pt progressing slowly.    Follow Up Recommendations  SNF     Equipment Recommendations  None recommended by PT    Recommendations for Other Services       Precautions / Restrictions Precautions Precautions: Fall;Knee Restrictions Weight Bearing Restrictions: No Other Position/Activity Restrictions: WBAT    Mobility  Bed Mobility Assisted back to bed sit to supine Max Assist   Transfers Overall transfer level: Needs assistance Equipment used: Rolling walker (2 wheeled) Transfers: Sit to/from Stand Sit to Stand: +2 physical assistance;+2 safety/equipment;From elevated surface;Min assist;Mod assist      Assisted from recliner back to bed with 50% VC's on safety with turn completion and proper walker placement     Ambulation/Gait  Transfer back to bed only this session     Balance                                            Cognition Arousal/Alertness: Awake/alert Behavior During Therapy: WFL for tasks assessed/performed Overall Cognitive Status: Within Functional Limits for tasks assessed                                 General Comments: required MAX encouragement      Exercises      General Comments        Pertinent Vitals/Pain Pain Assessment: 0-10 Pain Score: 7  Pain Location: L knee and L ankle Pain Descriptors / Indicators: Aching;Sore;Throbbing Pain  Intervention(s): Monitored during session;Repositioned    Home Living                      Prior Function            PT Goals (current goals can now be found in the care plan section) Progress towards PT goals: Progressing toward goals    Frequency    Min 5X/week      PT Plan Current plan remains appropriate    Co-evaluation              AM-PAC PT "6 Clicks" Mobility   Outcome Measure  Help needed turning from your back to your side while in a flat bed without using bedrails?: A Lot Help needed moving from lying on your back to sitting on the side of a flat bed without using bedrails?: A Lot Help needed moving to and from a bed to a chair (including a wheelchair)?: A Lot Help needed standing up from a chair using your arms (e.g., wheelchair or bedside chair)?: Total Help needed to walk in hospital room?: Total Help needed climbing 3-5 steps with a railing? : Total 6 Click Score: 9    End of Session Equipment  Utilized During Treatment: Gait belt Activity Tolerance: Patient limited by pain Patient left: in bed;with call bell/phone within reach;with chair alarm set;Other (comment) Nurse Communication: Mobility status PT Visit Diagnosis: Unsteadiness on feet (R26.81);Difficulty in walking, not elsewhere classified (R26.2);Pain Pain - Right/Left: Left Pain - part of body: Knee     Time: [id number] PT Time Calculation (min) (ACUTE ONLY): 11 min  Charges:   $Therapeutic Activity: 8-22 mins                     Rica Koyanagi  PTA Acute  Rehabilitation Services Pager      [phone] Office      [phone]

## 2019-09-19 NOTE — TOC Progression Note (Signed)
Transition of Care [facility]) - Progression Note    Patient Details  Name: Mathew Simpson MRN: [id number] Date of Birth: August 05, 1936  [facility]) CM/SW Contact  Tylin Force, Juliann Pulse, RN Phone Number: 09/19/2019, 1:12 PM  Clinical Narrative: Spoke to dtr Laurie-provided w/bed offers-await response,will start auth.      Expected Discharge Plan: Skilled Nursing Facility Barriers to Discharge: Continued Medical Work up  Expected Discharge Plan and Services Expected Discharge Plan: [place]   Discharge Planning Services: CM Consult     Expected Discharge Date: (unknown)                                     Social Determinants of Health (SDOH) Interventions    Readmission Risk Interventions No flowsheet data found.

## 2019-09-19 NOTE — TOC Initial Note (Signed)
Transition of Care [facility]) - Initial/Assessment Note    Patient Details  Name: Mathew Simpson MRN: [id number] Date of Birth: March 19, 1936  Transition [facility] Care [facility]) CM/SW Contact:    Dessa Phi, RN Phone Number: 09/19/2019, 12:04 PM  Clinical Narrative:PT recc SNF-Patient dtr agree to fax out prefer [place]-await bed offers-will start insurance auth.                   Expected Discharge Plan: Skilled Nursing Facility Barriers to Discharge: Continued Medical Work up   Patient Goals and CMS Choice Patient states their goals for this hospitalization and ongoing recovery are:: go to rehab CMS Medicare.gov Compare Post Acute Care list provided to:: Patient Represenative (must comment) Choice offered to / list presented to : Patient  Expected Discharge Plan and Services Expected Discharge Plan: [place]   Discharge Planning Services: CM Consult     Expected Discharge Date: (unknown)                                    Prior Living Arrangements/Services   Lives with:: Self Patient language and need for interpreter reviewed:: Yes Do you feel safe going back to the place where you live?: Yes      Need for Family Participation in Patient Care: No (Comment) Care giver support system in place?: Yes (comment)   Criminal Activity/Legal Involvement Pertinent to Current Situation/Hospitalization: No - Comment as needed  Activities of Daily Living Home Assistive Devices/Equipment: Walker (specify type), Other (Comment), Bedside commode/3-in-1, Eyeglasses(front wheeled walker, upper/lower dentures, walk-in-shower, walking stick) ADL Screening (condition at time of admission) Patient's cognitive ability adequate to safely complete daily activities?: Yes Is the patient deaf or have difficulty hearing?: No Does the patient have difficulty seeing, even when wearing glasses/contacts?: No Does the patient have difficulty concentrating, remembering, or making decisions?:  No Patient able to express need for assistance with ADLs?: Yes Does the patient have difficulty dressing or bathing?: Yes Independently performs ADLs?: No Communication: Independent Dressing (OT): Needs assistance Is this a change from baseline?: Pre-admission baseline Grooming: Needs assistance Is this a change from baseline?: Pre-admission baseline Feeding: Needs assistance Is this a change from baseline?: Pre-admission baseline Bathing: Needs assistance Is this a change from baseline?: Pre-admission baseline Toileting: Needs assistance Is this a change from baseline?: Pre-admission baseline In/Out Bed: Needs assistance Is this a change from baseline?: Pre-admission baseline Walks in Home: Needs assistance Is this a change from baseline?: Pre-admission baseline Does the patient have difficulty walking or climbing stairs?: Yes(secondary to weakness and recent total left knee) Weakness of Legs: Both(L>R) Weakness of Arms/Hands: None  Permission Sought/Granted Permission sought to share information with : Case Manager Permission granted to share information with : Yes, Verbal Permission Granted  Share Information with NAME: Johnanna Schneiders  Permission granted to share info w AGENCY: SNF  Permission granted to share info w Relationship: daughter  Permission granted to share info w Contact Information: [phone]  Emotional Assessment Appearance:: Appears stated age Attitude/Demeanor/Rapport: Gracious Affect (typically observed): Accepting Orientation: : Oriented to Self, Oriented to Place, Oriented to  Time, Oriented to Situation Alcohol / Substance Use: Not Applicable Psych Involvement: No (comment)  Admission diagnosis:  Cellulitis of left lower extremity [L03.116] Septic joint of left knee joint ([place]) [M00.9] Patient Active Problem List   Diagnosis Date Noted  . Cellulitis of left lower extremity   . Septic joint  of left knee joint ([place]) 09/15/2019  . Acute blood loss  anemia 09/15/2019  . Essential hypertension 09/15/2019  . OA (osteoarthritis) of knee 09/12/2019   PCP:  Secundino Ginger, PA-C Pharmacy:   [id number] [place], [place]. AT [address] [place] [place] [address] Phone: [phone] Fax: [phone]     Social Determinants of Health (SDOH) Interventions    Readmission Risk Interventions No flowsheet data found.

## 2019-09-19 NOTE — Progress Notes (Addendum)
PROGRESS NOTE    Mathew Simpson  [id number] DOB: Feb 17, 1936 DOA: 09/15/2019 PCP: Secundino Ginger, PA-C  Brief Narrative: 83 y.o.malewithhistory of recently diagnosed A. fib, hypertension who underwent total left knee replacement on 09/12/19 and was discharged home 2 days ago with resumption of chronic anticoagulation, brought in by family due to concern for altered mental status/confusion with hallucinations and not ambulating. Given the symptoms initially his pain medication were discontinued but still patient was confused and was brought to the ER. Per daughter patient did not have any chest pain shortness of breath nausea vomiting or diarrhea. Has not been eating well for last 1 day. ED Course:In the ER patient initially appeared confused and febrile with a temperature of 100.2 F chest x-ray shows some cardiomegaly and congestion. UA was unremarkable. On exam patient's left knee looks inflamed and erythematous. Concerning for septic knee. Patient orthopedic surgeon Dr. Georgiana Spinner notified. Patient was started on empiric antibiotic after obtaining blood cultures. Patient labs show WBC count of 11.6 glucose 178 sodium 134 albumin 3.4 hemoglobin 11.8 platelets 168 COVID-19 test was negative. Assessment & Plan:   Principal Problem:   Septic joint of left knee joint ([place]) Active Problems:   Acute blood loss anemia   Essential hypertension  Acute encephalopathy: likely toxic encephalopathy in the setting of opiates for post op pain.  He is a bit more awake and alert compared to yesterday but not still back to his baseline.  According to his family he he was very independent with all his ADLs he had sharp memory.  He is still not able to remember a lot of things.   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 Ortho feels his symptomatology consistent with hemarthrosis/expected post operative course in the setting of Eliquis use. NO evidence of septic knee per Ortho-appreciate their eval, follow recommendations: rest, ice and elevate the knee, PT eval recommends skilled nursing facility.  Patient is unable to stand or ambulate without help.  Patient is unsafe to be discharged home due to need for SNF and further rehabilitation prior to returning home. Marland KitchenHe was originally scheduled to return to the office on 10/6 but Ortho want him to now return on 9/29. Consider inpatient arthrocentesis if symptomatology worsens.  RESTART APIXABAN WHEN OK WITH ORTHO  Acute blood loss anemia: In the setting of # 2. Hgb initially downtrending.  Now stable.  Monitor.  Recently diagnosed A. fib on Cardizem. Holding apixaban in concern for #2  Low Grade temp/leucocytosis : resolved now. Could be inflammatory response to #2 vs cellulitis vs aspiration in setting of #1. CXR unremarkable. U/A -ve without any pyuria. Follow blood cx. Narrow abx - to doxycycline. S/p vancomycin/clinda/azactam in ED  Hypertension: on ACE inhibitor.  History of chronic pain: hold opiates (oxy 5-10 q 6prn) in concern for #1, resume at lower dose/frequence if and when needed. Hold Robaxin. Resumed Neurontin.Watch for signs of withdrawal.     Right leg pain. Lower extremity Doppler -no evidence of DVT.   Intractable nausea and vomiting-resolved we will start with full liquid diet. X-ray showed evidence of possible small bowel obstruction. CT abdomen was performed which ruled out small bowel obstruction and show significant stool burden. We will provide stool softeners.  DVT prophylaxis: Eliquis on hold.  Code Status: Full  Family / Patient Communication: Discussed with patient's daughter Cecille Rubin Disposition  Plan: SNF    Estimated body mass index is 26.61 kg/m as calculated from the following:   Height as of this  encounter: 5\' 8"  (1.727 m).   Weight as of this encounter: 79.4 kg.    Subjective:  Patient resting in bed awake he reported after eating a bagel he got sick in his stomach Objective: Vitals:   09/18/19 0942 09/18/19 1310 09/18/19 2057 09/19/19 0518  BP: 135/78 (!) 144/92 (!) 149/87 139/86  Pulse:  95 96 86  Resp:  (!) 22 20 20   Temp:  98.5 F (36.9 C) 98.2 F (36.8 C) 98.3 F (36.8 C)  TempSrc:  Oral    SpO2:  96% 96% 99%  Weight:      Height:        Intake/Output Summary (Last 24 hours) at 09/19/2019 1041 Last data filed at 09/19/2019 0730 Gross per 24 hour  Intake 120 ml  Output 875 ml  Net -755 ml   Filed Weights   09/16/19 1336  Weight: 79.4 kg    Examination:  General exam: Appears calm and comfortable  Respiratory system: Clear to auscultation. Respiratory effort normal. Cardiovascular system: S1 & S2 heard, RRR. No JVD, murmurs, rubs, gallops or clicks. No pedal edema. Gastrointestinal system: Abdomen is nondistended, soft and nontender. No organomegaly or masses felt. Normal bowel sounds heard. Central nervous system: Alert and oriented. No focal neurological deficits. Extremities: Left lower extremity Steri-Strips are intact ecchymosis noted to the left lower extremity Psychiatry: Judgement and insight appear normal. Mood & affect appropriate.     Data Reviewed: I have personally reviewed following labs and imaging studies  CBC: Recent Labs  Lab 09/13/19 0246 09/15/19 1710 09/16/19 0604 09/17/19 0528 09/18/19 0442  WBC 10.7* 11.6* 8.4 8.3 8.7  NEUTROABS  --  9.6*  --   --  6.6  HGB 11.2* 11.8* 10.1* 9.7* 10.1*  HCT 34.3* 35.3* 30.9* 29.5* 31.5*  MCV 93.2 91.5 92.8 92.5 93.8  PLT 181 168 149* 163 0000000   Basic Metabolic Panel: Recent Labs  Lab 09/13/19 0246 09/15/19 1710 09/16/19 0604 09/17/19 0528 09/18/19 0442  NA 136 134* 138 140 140  K 4.2 3.5 3.6 3.3* 3.7  CL 103 99 107 108 108  CO2 26 24 24 22 23   GLUCOSE 187* 178* 139* 130* 156*   BUN 21 21 21 20 20   CREATININE 0.77 0.90 0.71 0.66 0.63  CALCIUM 8.4* 8.2* 8.0* 8.0* 8.1*  MG  --   --   --   --  2.2   GFR: Estimated Creatinine Clearance: 70.1 mL/min (by C-G formula based on SCr of 0.63 mg/dL). Liver Function Tests: Recent Labs  Lab 09/15/19 1710 09/18/19 0442  AST 27 25  ALT 27 31  ALKPHOS 63 71  BILITOT 1.6* 1.0  PROT 6.3* 5.6*  ALBUMIN 3.4* 2.5*   No results for input(s): LIPASE, AMYLASE in the last 168 hours. No results for input(s): AMMONIA in the last 168 hours. Coagulation Profile: No results for input(s): INR, PROTIME in the last 168 hours. Cardiac Enzymes: Recent Labs  Lab 09/18/19 0442  CKTOTAL 22*   BNP (last 3 results) No results for input(s): PROBNP in the last 8760 hours. HbA1C: No results for input(s): HGBA1C in the last 72 hours. CBG: Recent Labs  Lab 09/17/19 1220 09/17/19 1652 09/18/19 0016 09/18/19 0616 09/18/19 1202  GLUCAP 137* 131* 122* 130* 131*   Lipid Profile: No results for input(s): CHOL, HDL, LDLCALC, TRIG, CHOLHDL, LDLDIRECT in the last  72 hours. Thyroid Function Tests: No results for input(s): TSH, T4TOTAL, FREET4, T3FREE, THYROIDAB in the last 72 hours. Anemia Panel: No results for input(s): VITAMINB12, FOLATE, FERRITIN, TIBC, IRON, RETICCTPCT in the last 72 hours. Sepsis Labs: Recent Labs  Lab 09/18/19 0442  LATICACIDVEN 1.1    Recent Results (from the past 240 hour(s))  Culture, blood (routine x 2)     Status: None (Preliminary result)   Collection Time: 09/15/19  4:55 PM   Specimen: BLOOD LEFT FOREARM  Result Value Ref Range Status   Specimen Description   Final    BLOOD LEFT FOREARM Performed at [facility], [place] [address] [place], [place] [address]    Special Requests   Final    BOTTLES DRAWN AEROBIC ONLY Blood Culture adequate volume Performed at [facility], [address], [place], [place] [address]    Culture   Final    NO GROWTH 3  DAYS Performed at [facility], [place] [address] [place], [place] [address]    Report Status PENDING  Incomplete  Culture, blood (routine x 2)     Status: None (Preliminary result)   Collection Time: 09/15/19  5:10 PM   Specimen: BLOOD LEFT FOREARM  Result Value Ref Range Status   Specimen Description   Final    BLOOD LEFT FOREARM Performed at [facility], [place] [address] [place], [place] [address]    Special Requests   Final    BOTTLES DRAWN AEROBIC ONLY Blood Culture adequate volume Performed at [facility], [place] [address], [place], [place] [address]    Culture   Final    NO GROWTH 3 DAYS Performed at [facility], [place] [address], [place], [place] [address]    Report Status PENDING  Incomplete  Urine culture     Status: None   Collection Time: 09/15/19  6:30 PM   Specimen: Urine, Random  Result Value Ref Range Status   Specimen Description   Final    URINE, RANDOM Performed at [facility], [place] [address] [place], [place] [address]    Special Requests   Final    NONE Performed at [facility], [place] [address] [place], [place] [address]    Culture   Final    NO GROWTH Performed at [facility], [place] [address], [place], [place] [address]    Report Status 09/16/2019 FINAL  Final  SARS CORONAVIRUS 2 (TAT 6-24 HRS) Nasopharyngeal Nasopharyngeal Swab     Status: None   Collection Time: 09/15/19  6:44 PM   Specimen: Nasopharyngeal Swab  Result Value Ref Range Status   SARS Coronavirus 2 NEGATIVE NEGATIVE Final    Comment: (NOTE) SARS-CoV-2 target nucleic acids are NOT DETECTED. The SARS-CoV-2 RNA is generally detectable in upper and lower respiratory specimens during the acute phase of infection. Negative results do not preclude SARS-CoV-2 infection, do not rule out co-infections with other pathogens, and should not be used as the sole basis for treatment or other patient  management decisions. Negative results must be combined with clinical observations, patient history, and epidemiological information. The expected result is Negative. Fact Sheet for Patients: SugarRoll.be Fact Sheet for Healthcare Providers: https://www.woods-.com/ This test is not yet approved or cleared by the [place] FDA and  has been authorized for detection and/or diagnosis of SARS-CoV-2 by FDA under an Emergency Use Authorization (EUA). This EUA will remain  in  effect (meaning this test can be used) for the duration of the COVID-19 declaration under Section 56 4(b)(1) of the Act, 21 U.S.C. section 360bbb-3(b)(1), unless the authorization is terminated or revoked sooner. Performed at [facility], [place] [address], [place], [place] [address]          Radiology Studies: Ct Abdomen Pelvis W Contrast  Result Date: 09/18/2019 CLINICAL DATA:  abd pain , eval for bowel obstruction Bowel obstruction, low-grade EXAM: CT ABDOMEN AND PELVIS WITH CONTRAST TECHNIQUE: Multidetector CT imaging of the abdomen and pelvis was performed using the standard protocol following bolus administration of intravenous contrast. CONTRAST:  15mL OMNIPAQUE IOHEXOL 300 MG/ML SOLN, 135mL OMNIPAQUE IOHEXOL 300 MG/ML SOLN COMPARISON:  100 mL Omnipaque 300 FINDINGS: Lower chest: Lung bases are clear. Hepatobiliary: Small cyst in the RIGHT hepatic lobe. No gallbladder abnormality. Enhancing 10 mm lesion in the RIGHT hepatic lobe (segment 8) is favored benign vascular lesion (image 19/2). Pancreas: Pancreas is normal. No ductal dilatation. No pancreatic inflammation. Spleen: Normal spleen Adrenals/urinary tract: Adrenal glands normal. Multiple simple fluid attenuation renal cysts. Ureters and bladder normal Stomach/Bowel: Stomach, small bowel cecum normal. appendix is prominent at 10 mm (image 87/5) however there is no periappendiceal inflammation. Ascending,  transverse descending colon normal. No bowel obstruction. No bowel inflammation. Vascular/Lymphatic: Abdominal aorta is normal caliber with atherosclerotic calcification. There is no retroperitoneal or periportal lymphadenopathy. No pelvic lymphadenopathy. Reproductive: Prostate normal. Other: Bilateral fat filled inguinal hernias. Musculoskeletal: No aggressive osseous lesion. IMPRESSION: 1. No acute findings in the abdomen pelvis. 2. No evidence of bowel obstruction or bowel inflammation. 3. Appendix is prominent but there is no evidence of para appendiceal inflammation to suggest acute appendicitis. 4. Small enhancing lesion the RIGHT hepatic lobe. In the absence of malignancy favored benign vascular lesion. 5. Multiple benign-appearing renal cysts. 6. Moderate volume stool in the rectosigmoid colon. Electronically Signed   By: Suzy Bouchard M.D.   On: 09/18/2019 17:02   Dg Abd Portable 1v  Result Date: 09/17/2019 CLINICAL DATA:  Intractable nausea and vomiting EXAM: PORTABLE ABDOMEN - 1 VIEW COMPARISON:  None. FINDINGS: Multiple borderline distended loops of small bowel in the mid abdomen and right upper quadrant. Air and stool projects over the colon and rectal vault. Few punctate calcifications present in the right upper quadrant and left upper quadrant, not clearly confined to the renal shadows or gallbladder fossa, may reflect ingested material within the bowel. Evaluation for free intraperitoneal air is limited on this supine only film. Extensive degenerative changes are present in the spine and pelvis. Mild levocurvature of the spine is noted as well. IMPRESSION: Multiple borderline distended loops of small bowel in the mid abdomen and right upper quadrant, nonspecific. Findings could reflect early/developing small bowel obstruction or ileus. Electronically Signed   By: Lovena Le M.D.   On: 09/17/2019 21:26   Vas [place] Lower Extremity Venous (dvt)  Result Date: 09/18/2019  Lower Venous Study  Indications: Pain, Swelling, and Erythema.  Risk Factors: Surgery recent left TKA. Comparison Study: No recent study available. Performing Technologist: Toma Copier RVS  Examination Guidelines: A complete evaluation includes B-mode imaging, spectral Doppler, color Doppler, and power Doppler as needed of all accessible portions of each vessel. Bilateral testing is considered an integral part of a complete examination. Limited examinations for reoccurring indications may be performed as noted.  +---------+---------------+---------+-----------+----------+--------------+  RIGHT     Compressibility Phasicity Spontaneity Properties Thrombus Aging  +---------+---------------+---------+-----------+----------+--------------+  CFV       Full  Yes       Yes                                    +---------+---------------+---------+-----------+----------+--------------+  SFJ       Full                                                             +---------+---------------+---------+-----------+----------+--------------+  FV Prox   Full            Yes       Yes                                    +---------+---------------+---------+-----------+----------+--------------+  FV Mid    Full                                                             +---------+---------------+---------+-----------+----------+--------------+  FV Distal Full            Yes       Yes                                    +---------+---------------+---------+-----------+----------+--------------+  PFV       Full            Yes       Yes                                    +---------+---------------+---------+-----------+----------+--------------+  POP       Full            Yes       Yes                                    +---------+---------------+---------+-----------+----------+--------------+  PTV       Full                                                             +---------+---------------+---------+-----------+----------+--------------+   PERO      Full                                                             +---------+---------------+---------+-----------+----------+--------------+   +---------+---------------+---------+-----------+----------+--------------+  LEFT      Compressibility Phasicity Spontaneity Properties Thrombus Aging  +---------+---------------+---------+-----------+----------+--------------+  CFV       Full  Yes       Yes                                    +---------+---------------+---------+-----------+----------+--------------+  SFJ       Full                                                             +---------+---------------+---------+-----------+----------+--------------+  FV Prox   Full            Yes       Yes                                    +---------+---------------+---------+-----------+----------+--------------+  FV Mid    Full                                                             +---------+---------------+---------+-----------+----------+--------------+  FV Distal Full            Yes       Yes                                    +---------+---------------+---------+-----------+----------+--------------+  PFV       Full            Yes       Yes                                    +---------+---------------+---------+-----------+----------+--------------+  POP       Full            Yes       Yes                                    +---------+---------------+---------+-----------+----------+--------------+  PTV       Full                                                             +---------+---------------+---------+-----------+----------+--------------+  PERO      Full                                                             +---------+---------------+---------+-----------+----------+--------------+     Summary: Right: There is no evidence of deep vein thrombosis in the lower extremity. No cystic structure found in the popliteal fossa. Left: There is no evidence  of deep vein thrombosis in the  lower extremity. No cystic structure found in the popliteal fossa.  *See table(s) above for measurements and observations.    Preliminary         Scheduled Meds:  diltiazem  240 mg Oral Daily   doxycycline  100 mg Oral Q12H   gabapentin  300 mg Oral TID   metoCLOPramide (REGLAN) injection  5 mg Intravenous Q6H   polyethylene glycol  17 g Oral Daily   senna-docusate  2 tablet Oral BID   Continuous Infusions:   LOS: 4 days     Georgette Shell, MD [facility]  If 7PM-7AM, please contact night-coverage [email or web address] Password [email or web address] 09/19/2019, 10:41 AM

## 2019-09-19 NOTE — Progress Notes (Signed)
Physical Therapy Treatment Patient Details Name: Mathew Simpson MRN: [id number] DOB: 1936-03-09 Today's Date: 09/19/2019    History of Present Illness Pt admitted with acute encephalopathy and L TKR hemarthrosis; Pt is s/p R TKR 09/12/19    PT Comments    Assisted to EOB.  General bed mobility comments: cues for sequence and assist to manage L LE and to bring trunk to upright position  demonstarted and instructed how to use a belt to self assist L LE.  Attempted standing + 1 assist unable.  Pt required + 2 assist.  General transfer comment: cues for LE management and use of UEs to self assist; physical assist to bring wt up and fwd and to balance in intiial standing.  Increased c/o back pain and "other knee" hurting and "popping". Assisted with amb a limited distance.  General Gait Details: cues for sequence, posture and position from LE; great difficulty weight bearing on either LE due to B knee pain/back pain.  Positioned in recliner and performed a few TKR TE's followed by ICE. Pt will need ST Rehab at SNF prior to safely returning home.   Follow Up Recommendations  SNF     Equipment Recommendations  None recommended by PT    Recommendations for Other Services       Precautions / Restrictions Precautions Precautions: Fall;Knee Restrictions Weight Bearing Restrictions: No Other Position/Activity Restrictions: WBAT    Mobility  Bed Mobility Overal bed mobility: Needs Assistance Bed Mobility: Supine to Sit     Supine to sit: Min assist;+2 for physical assistance;+2 for safety/equipment;HOB elevated     General bed mobility comments: cues for sequence and assist to manage L LE and to bring trunk to upright position  demonstarted and instructed how to use a belt to self assist L LE  Transfers Overall transfer level: Needs assistance Equipment used: Rolling walker (2 wheeled) Transfers: Sit to/from Stand Sit to Stand: +2 physical assistance;+2 safety/equipment;From elevated  surface;Min assist;Mod assist         General transfer comment: cues for LE management and use of UEs to self assist; physical assist to bring wt up and fwd and to balance in intiial standing.  Increased c/o back pain and "other knee" hurting and "popping".  Ambulation/Gait Ambulation/Gait assistance: Mod assist;+2 physical assistance;+2 safety/equipment Gait Distance (Feet): 5 Feet Assistive device: Bilateral platform walker(EVA walker) Gait Pattern/deviations: Step-to pattern;Trunk flexed;Shuffle;Antalgic Gait velocity: decreased   General Gait Details: cues for sequence, posture and position from LE; great difficulty weight bearing on either LE due to B knee pain/back pain   Stairs             Wheelchair Mobility    Modified Rankin (Stroke Patients Only)       Balance                                            Cognition Arousal/Alertness: Awake/alert Behavior During Therapy: WFL for tasks assessed/performed Overall Cognitive Status: Within Functional Limits for tasks assessed                                 General Comments: required MAX encouragement      Exercises   Total Knee Replacement TE's 10 reps B LE ankle pumps 10 reps towel squeezes 10 reps knee presses   5 reps  heel slides  10 reps SLR's 10 reps ABD Followed by ICE     General Comments        Pertinent Vitals/Pain Pain Assessment: 0-10 Pain Score: 7  Pain Location: L knee and L ankle Pain Descriptors / Indicators: Aching;Sore;Throbbing Pain Intervention(s): Monitored during session;Repositioned    Home Living                      Prior Function            PT Goals (current goals can now be found in the care plan section) Progress towards PT goals: Progressing toward goals    Frequency    Min 5X/week      PT Plan Current plan remains appropriate    Co-evaluation              AM-PAC PT "6 Clicks" Mobility   Outcome  Measure  Help needed turning from your back to your side while in a flat bed without using bedrails?: A Lot Help needed moving from lying on your back to sitting on the side of a flat bed without using bedrails?: A Lot Help needed moving to and from a bed to a chair (including a wheelchair)?: A Lot Help needed standing up from a chair using your arms (e.g., wheelchair or bedside chair)?: Total Help needed to walk in hospital room?: Total Help needed climbing 3-5 steps with a railing? : Total 6 Click Score: 9    End of Session Equipment Utilized During Treatment: Gait belt Activity Tolerance: Patient limited by pain Patient left: in chair;with call bell/phone within reach;with chair alarm set;Other (comment) Nurse Communication: Mobility status PT Visit Diagnosis: Unsteadiness on feet (R26.81);Difficulty in walking, not elsewhere classified (R26.2);Pain Pain - Right/Left: Left Pain - part of body: Knee     Time: 1411-1439 PT Time Calculation (min) (ACUTE ONLY): 28 min  Charges:  $Gait Training: 8-22 mins $Therapeutic Exercise: 8-22 mins                     Rica Koyanagi  PTA Acute  Rehabilitation Services Pager      [phone] Office      [phone]

## 2019-09-20 LAB — COMPREHENSIVE METABOLIC PANEL
ALT: 38 U/L (ref 0–44)
AST: 27 U/L (ref 15–41)
Albumin: 2.5 g/dL — ABNORMAL LOW (ref 3.5–5.0)
Alkaline Phosphatase: 76 U/L (ref 38–126)
Anion gap: 10 (ref 5–15)
BUN: 25 mg/dL — ABNORMAL HIGH (ref 8–23)
CO2: 24 mmol/L (ref 22–32)
Calcium: 8.1 mg/dL — ABNORMAL LOW (ref 8.9–10.3)
Chloride: 103 mmol/L (ref 98–111)
Creatinine, Ser: 0.65 mg/dL (ref 0.61–1.24)
GFR calc Af Amer: >60 mL/min (ref >60)
GFR calc non Af Amer: >60 mL/min (ref >60)
Glucose, Bld: 145 mg/dL — ABNORMAL HIGH (ref 70–99)
Potassium: 3.7 mmol/L (ref 3.5–5.1)
Sodium: 137 mmol/L (ref 135–145)
Total Bilirubin: 1.2 mg/dL (ref 0.3–1.2)
Total Protein: 5.2 g/dL — ABNORMAL LOW (ref 6.5–8.1)

## 2019-09-20 LAB — CBC
HCT: 30.6 % — ABNORMAL LOW (ref 39.0–52.0)
Hemoglobin: 9.8 g/dL — ABNORMAL LOW (ref 13.0–17.0)
MCH: 30.1 pg (ref 26.0–34.0)
MCHC: 32 g/dL (ref 30.0–36.0)
MCV: 93.9 fL (ref 80.0–100.0)
Platelets: 219 10*3/uL (ref 150–400)
RBC: 3.26 MIL/uL — ABNORMAL LOW (ref 4.22–5.81)
RDW: 13.1 % (ref 11.5–15.5)
WBC: 10.4 10*3/uL (ref 4.0–10.5)
nRBC: 0 % (ref 0.0–0.2)

## 2019-09-20 LAB — CULTURE, BLOOD (ROUTINE X 2)
Culture: NO GROWTH
Culture: NO GROWTH
Special Requests: ADEQUATE
Special Requests: ADEQUATE

## 2019-09-20 LAB — SARS CORONAVIRUS 2 BY RT PCR (HOSPITAL ORDER, PERFORMED IN ~~LOC~~ HOSPITAL LAB): SARS Coronavirus 2: NEGATIVE

## 2019-09-20 MED ORDER — HYDROCODONE-ACETAMINOPHEN 7.5-325 MG PO TABS: 1.0000 | ORAL_TABLET | Freq: Four times a day (QID) | ORAL | 0 refills | Status: DC | PRN

## 2019-09-20 MED ORDER — LISINOPRIL 20 MG PO TABS: 10.0000 mg | ORAL_TABLET | Freq: Every day | ORAL | 1 refills | Status: DC

## 2019-09-20 NOTE — Progress Notes (Signed)
[facility]. Pt remains stable, [facility] provided transport.

## 2019-09-20 NOTE — Discharge Summary (Addendum)
Physician Discharge Summary  Mathew Simpson [id number] DOB: [birth year] DOA: 09/15/2019  PCP: Secundino Ginger, PA-C  Admit date: 09/15/2019 Discharge date: 09/20/2019  Admitted From: Home  disposition: Nursing home Recommendations for Outpatient Follow-up:  1. Follow up with PCP in 1-2 weeks 2. Please obtain BMP/CBC in one week 3. Please follow up with [facility] Equipment/Devices: None Discharge Condition stable and improved CODE STATUS: Full code Diet recommendation: Cardiac diet Brief/Interim Summary:83 y.o.malewithhistory of recently diagnosed A. fib, hypertension who underwent total left knee replacement on 09/12/19 and was discharged home 2 days ago with resumption of chronic anticoagulation, brought in by family due to concern for altered mental status/confusion with hallucinations and not ambulating. Given the symptoms initially his pain medication were discontinued but still patient was confused and was brought to the ER. Per daughter patient did not have any chest pain shortness of breath nausea vomiting or diarrhea. Has not been eating well for last 1 day. ED Course:In the ER patient initially appeared confused and febrile with a temperature of 100.2 F chest x-ray shows some cardiomegaly and congestion. UA was unremarkable. On exam patient's left knee looks inflamed and erythematous. Concerning for septic knee. Patient orthopedic surgeon Dr. Georgiana Spinner notified. Patient was started on empiric antibiotic after obtaining blood cultures. Patient labs show WBC count of 11.6 glucose 178 sodium 134 albumin 3.4 hemoglobin 11.8 platelets 168 COVID-19 test was negative.  Discharge Diagnoses:  Principal Problem:   Septic joint of left knee joint ([place]) Active Problems:   Acute blood loss anemia   Essential hypertension   Cellulitis of left lower extremity  Acute encephalopathy: likely toxic encephalopathy in the setting of opiates for post op pain.  He is a  more  awake and alert compared to yesterday   According to his family he he was very independent with all his ADLs he had sharp memory.  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 Ortho feels his symptomatology consistent with hemarthrosis/expected post operative course in the setting of Eliquis use. NO evidence of septic knee per Ortho-appreciate their eval, follow recommendations: rest, ice and elevate the knee,PT eval recommends skilled nursing facility.  Patient is unable to stand or ambulate without help.  Patient is unsafe to be discharged home due to need for SNF and further rehabilitation prior to returning home. Marland KitchenHe was originally scheduled to return to the office on 10/6 but Ortho want him to now return on 9/29. Consider inpatient arthrocentesis if symptomatology worsens.  On admission patient had leukocytosis with low-grade temp which has been resolved.  He received Vanco Clinda and less active him in the ED.  Then he was treated with doxycycline.  Will restart apixaban discussed with Dr. Ricki Rodriguez.  Acute blood loss anemia: Secondary to hemarthrosis stable   Recently diagnosed A. fibon Cardizem. Been restarted.    Hypertension: Continue home meds  History of chronic pain:Restarting [place] monitor closely since patient had change in mental status due to narcotics.  Continue Neurontin.   HE IS COVID NEGATIVE 09/20/19  Estimated body mass index is 26.61 kg/m as calculated from the following:   Height as of this encounter: 5\' 8"  (1.727 m).   Weight as of this encounter: 79.4 kg.  Discharge Instructions   Allergies as of 09/20/2019      Reactions   Penicillins Anaphylaxis   Did it  involve swelling of the face/tongue/throat, SOB, or low BP? Yes Did it involve sudden or severe rash/hives, skin peeling, or  any reaction on the inside of your mouth or nose? Unknown Did you need to seek medical attention at a hospital or doctor's office? Yes When did it last happen?50+ years If all above answers are "NO", may proceed with cephalosporin use.      Medication List    STOP taking these medications   oxyCODONE 5 MG immediate release tablet Commonly known as: Oxy IR/ROXICODONE     TAKE these medications   CALCIUM + D PO Take 1 tablet by mouth 2 (two) times daily.   Dialyvite Vitamin D 5000 125 MCG (5000 UT) capsule Generic drug: Cholecalciferol Take 5,000 Units by mouth daily.   diltiazem 240 MG 24 hr capsule Commonly known as: TIAZAC Take 240 mg by mouth daily.   Eliquis 5 MG Tabs tablet Generic drug: apixaban Take 5 mg by mouth 2 (two) times daily.   gabapentin 300 MG capsule Commonly known as: NEURONTIN Take 300 mg by mouth 3 (three) times daily.   HYDROcodone-acetaminophen 7.5-325 MG tablet Commonly known as: NORCO Take 1 tablet by mouth every 6 (six) hours as needed for severe pain.   lisinopril 20 MG tablet Commonly known as: ZESTRIL Take 0.5 tablets (10 mg total) by mouth daily. What changed: how much to take   methocarbamol 500 MG tablet Commonly known as: ROBAXIN Take 1 tablet (500 mg total) by mouth every 6 (six) hours as needed for muscle spasms.   multivitamin with minerals Tabs tablet Take 1 tablet by mouth daily.   PreserVision AREDS 2 Caps Take 1 capsule by mouth 2 (two) times daily.   trimethoprim-polymyxin b ophthalmic solution Commonly known as: POLYTRIM Place 1 drop into the left eye See admin instructions. Instill 1 drop into left eye 4 times daily for 2 days after eye injection      Follow-up Information    Gaynelle Arabian, MD. Call on 09/20/2019.   Specialty: Orthopedic Surgery Why: Call [phone] ASAP for appointment on Tuesday 9/29 Contact information: [address] [place] [address] [id number]         Secundino Ginger, PA-C Follow up.   Specialty: Cardiology Contact information: [facility]  [address] [place] [address] [phone]          Allergies  Allergen Reactions  . Penicillins Anaphylaxis    Did it involve swelling of the face/tongue/throat, SOB, or low BP? Yes Did it involve sudden or severe rash/hives, skin peeling, or any reaction on the inside of your mouth or nose? Unknown Did you need to seek medical attention at a hospital or doctor's office? Yes When did it last happen?50+ years If all above answers are "NO", may proceed with cephalosporin use.     Consultations:  Ortho   Procedures/Studies: Ct Abdomen Pelvis W Contrast  Result Date: 09/18/2019 CLINICAL DATA:  abd pain , eval for bowel obstruction Bowel obstruction, low-grade EXAM: CT ABDOMEN AND PELVIS WITH CONTRAST TECHNIQUE: Multidetector CT imaging of the abdomen and pelvis was performed using the standard protocol following bolus administration of intravenous contrast. CONTRAST:  68mL OMNIPAQUE IOHEXOL 300 MG/ML SOLN, 143mL OMNIPAQUE IOHEXOL 300 MG/ML SOLN COMPARISON:  100 mL Omnipaque 300 FINDINGS: Lower chest: Lung bases are clear. Hepatobiliary: Small cyst in the RIGHT hepatic lobe. No gallbladder abnormality. Enhancing 10 mm lesion in the RIGHT hepatic lobe (segment 8) is favored benign vascular lesion (image 19/2).  Pancreas: Pancreas is normal. No ductal dilatation. No pancreatic inflammation. Spleen: Normal spleen Adrenals/urinary tract: Adrenal glands normal. Multiple simple fluid attenuation renal cysts. Ureters and bladder normal Stomach/Bowel: Stomach, small bowel cecum normal. appendix is prominent at 10 mm (image 87/5) however there is no periappendiceal inflammation. Ascending, transverse descending colon normal. No bowel obstruction. No bowel inflammation. Vascular/Lymphatic: Abdominal aorta is normal caliber with atherosclerotic calcification. There is no  retroperitoneal or periportal lymphadenopathy. No pelvic lymphadenopathy. Reproductive: Prostate normal. Other: Bilateral fat filled inguinal hernias. Musculoskeletal: No aggressive osseous lesion. IMPRESSION: 1. No acute findings in the abdomen pelvis. 2. No evidence of bowel obstruction or bowel inflammation. 3. Appendix is prominent but there is no evidence of para appendiceal inflammation to suggest acute appendicitis. 4. Small enhancing lesion the RIGHT hepatic lobe. In the absence of malignancy favored benign vascular lesion. 5. Multiple benign-appearing renal cysts. 6. Moderate volume stool in the rectosigmoid colon. Electronically Signed   By: Suzy Bouchard M.D.   On: 09/18/2019 17:02   Dg Chest Portable 1 View  Result Date: 09/15/2019 CLINICAL DATA:  Fever EXAM: PORTABLE CHEST 1 VIEW COMPARISON:  None. FINDINGS: There is mild cardiomegaly. There is prominence to the pulmonary vasculature. Aortic knob calcifications. No pleural effusion. No significant osseous abnormality. IMPRESSION: Cardiomegaly and pulmonary vascular congestion Electronically Signed   By: Prudencio Pair M.D.   On: 09/15/2019 17:24   Dg Abd Portable 1v  Result Date: 09/17/2019 CLINICAL DATA:  Intractable nausea and vomiting EXAM: PORTABLE ABDOMEN - 1 VIEW COMPARISON:  None. FINDINGS: Multiple borderline distended loops of small bowel in the mid abdomen and right upper quadrant. Air and stool projects over the colon and rectal vault. Few punctate calcifications present in the right upper quadrant and left upper quadrant, not clearly confined to the renal shadows or gallbladder fossa, may reflect ingested material within the bowel. Evaluation for free intraperitoneal air is limited on this supine only film. Extensive degenerative changes are present in the spine and pelvis. Mild levocurvature of the spine is noted as well. IMPRESSION: Multiple borderline distended loops of small bowel in the mid abdomen and right upper quadrant,  nonspecific. Findings could reflect early/developing small bowel obstruction or ileus. Electronically Signed   By: Lovena Le M.D.   On: 09/17/2019 21:26   Vas [place] Lower Extremity Venous (dvt)  Result Date: 09/19/2019  Lower Venous Study Indications: Pain, Swelling, and Erythema.  Risk Factors: Surgery recent left TKA. Comparison Study: No recent study available. Performing Technologist: Toma Copier RVS  Examination Guidelines: A complete evaluation includes B-mode imaging, spectral Doppler, color Doppler, and power Doppler as needed of all accessible portions of each vessel. Bilateral testing is considered an integral part of a complete examination. Limited examinations for reoccurring indications may be performed as noted.  +---------+---------------+---------+-----------+----------+--------------+ RIGHT    CompressibilityPhasicitySpontaneityPropertiesThrombus Aging +---------+---------------+---------+-----------+----------+--------------+ CFV      Full           Yes      Yes                                 +---------+---------------+---------+-----------+----------+--------------+ SFJ      Full                                                        +---------+---------------+---------+-----------+----------+--------------+  FV Prox  Full           Yes      Yes                                 +---------+---------------+---------+-----------+----------+--------------+ FV Mid   Full                                                        +---------+---------------+---------+-----------+----------+--------------+ FV DistalFull           Yes      Yes                                 +---------+---------------+---------+-----------+----------+--------------+ PFV      Full           Yes      Yes                                 +---------+---------------+---------+-----------+----------+--------------+ POP      Full           Yes      Yes                                  +---------+---------------+---------+-----------+----------+--------------+ PTV      Full                                                        +---------+---------------+---------+-----------+----------+--------------+ PERO     Full                                                        +---------+---------------+---------+-----------+----------+--------------+   +---------+---------------+---------+-----------+----------+--------------+ LEFT     CompressibilityPhasicitySpontaneityPropertiesThrombus Aging +---------+---------------+---------+-----------+----------+--------------+ CFV      Full           Yes      Yes                                 +---------+---------------+---------+-----------+----------+--------------+ SFJ      Full                                                        +---------+---------------+---------+-----------+----------+--------------+ FV Prox  Full           Yes      Yes                                 +---------+---------------+---------+-----------+----------+--------------+ FV Mid   Full                                                        +---------+---------------+---------+-----------+----------+--------------+  FV DistalFull           Yes      Yes                                 +---------+---------------+---------+-----------+----------+--------------+ PFV      Full           Yes      Yes                                 +---------+---------------+---------+-----------+----------+--------------+ POP      Full           Yes      Yes                                 +---------+---------------+---------+-----------+----------+--------------+ PTV      Full                                                        +---------+---------------+---------+-----------+----------+--------------+ PERO     Full                                                         +---------+---------------+---------+-----------+----------+--------------+     Summary: Right: There is no evidence of deep vein thrombosis in the lower extremity. No cystic structure found in the popliteal fossa. Left: There is no evidence of deep vein thrombosis in the lower extremity. No cystic structure found in the popliteal fossa.  *See table(s) above for measurements and observations. Electronically signed by Ruta Hinds MD on 09/19/2019 at 4:11:00 PM.    Final    (Echo, Carotid, EGD, Colonoscopy, ERCP)    Subjective: Patient resting in bed feeling hungry wants to eat no other complaints no nausea vomiting  Discharge Exam: Vitals:   09/20/19 0513 09/20/19 0908  BP: 117/77 (!) 136/96  Pulse: 87 93  Resp: 16   Temp: 98.4 F (36.9 C)   SpO2: 95%    Vitals:   09/19/19 1352 09/19/19 2105 09/20/19 0513 09/20/19 0908  BP: 118/76 124/81 117/77 (!) 136/96  Pulse: 85 100 87 93  Resp: 18 18 16    Temp: 97.8 F (36.6 C) 98.2 F (36.8 C) 98.4 F (36.9 C)   TempSrc: Oral Oral Oral   SpO2: 97% 96% 95%   Weight:      Height:        General: Pt is alert, awake, not in acute distress Cardiovascular: RRR, S1/S2 +, no rubs, no gallops Respiratory: CTA bilaterally, no wheezing, no rhonchi Abdominal: Soft, NT, ND, bowel sounds + Extremities: Left lower extremity with bruises and warm to touch   The results of significant diagnostics from this hospitalization (including imaging, microbiology, ancillary and laboratory) are listed below for reference.     Microbiology: Recent Results (from the past 240 hour(s))  Culture, blood (routine x 2)     Status: None   Collection Time: 09/15/19  4:55 PM   Specimen: BLOOD LEFT FOREARM  Result Value  Ref Range Status   Specimen Description   Final    BLOOD LEFT FOREARM Performed at [facility], [place] [address] [place], [place] [address]    Special Requests   Final    BOTTLES DRAWN AEROBIC ONLY Blood Culture adequate  volume Performed at [facility], [address], [place], [place] [address]    Culture   Final    NO GROWTH 5 DAYS Performed at [facility], [place] [address], [place], [place] [address]    Report Status 09/20/2019 FINAL  Final  Culture, blood (routine x 2)     Status: None   Collection Time: 09/15/19  5:10 PM   Specimen: BLOOD LEFT FOREARM  Result Value Ref Range Status   Specimen Description   Final    BLOOD LEFT FOREARM Performed at [facility], [place] [address], [place], [place] [address]    Special Requests   Final    BOTTLES DRAWN AEROBIC ONLY Blood Culture adequate volume Performed at [facility], [place] [address] [place], [place] [address]    Culture   Final    NO GROWTH 5 DAYS Performed at [facility], [place] [address] [employer] [employer], [place] [address]    Report Status 09/20/2019 FINAL  Final  Urine culture     Status: None   Collection Time: 09/15/19  6:30 PM   Specimen: Urine, Random  Result Value Ref Range Status   Specimen Description   Final    URINE, RANDOM Performed at [facility], [place] [address] [place], [place] [address]    Special Requests   Final    NONE Performed at [facility], [place] [address], [place], [place] [address]    Culture   Final    NO GROWTH Performed at [facility], [place] [address], [place], [place] [address]    Report Status 09/16/2019 FINAL  Final  SARS CORONAVIRUS 2 (TAT 6-24 HRS) Nasopharyngeal Nasopharyngeal Swab     Status: None   Collection Time: 09/15/19  6:44 PM   Specimen: Nasopharyngeal Swab  Result Value Ref Range Status   SARS Coronavirus 2 NEGATIVE NEGATIVE Final    Comment: (NOTE) SARS-CoV-2 target nucleic acids are NOT DETECTED. The SARS-CoV-2 RNA is generally detectable in upper and lower respiratory specimens during the acute phase of infection. Negative results do not preclude SARS-CoV-2 infection, do not  rule out co-infections with other pathogens, and should not be used as the sole basis for treatment or other patient management decisions. Negative results must be combined with clinical observations, patient history, and epidemiological information. The expected result is Negative. Fact Sheet for Patients: SugarRoll.be Fact Sheet for Healthcare Providers: https://www.woods-.com/ This test is not yet approved or cleared by the [place] FDA and  has been authorized for detection and/or diagnosis of SARS-CoV-2 by FDA under an Emergency Use Authorization (EUA). This EUA will remain  in effect (meaning this test can be used) for the duration of the COVID-19 declaration under Section 56 4(b)(1) of the Act, 21 U.S.C. section 360bbb-3(b)(1), unless the authorization is terminated or revoked sooner. Performed at [facility], [place] [address] [place],  [address]      Labs: BNP (last 3 results) No results for input(s): BNP in the last 8760 hours. Basic Metabolic Panel: Recent Labs  Lab 09/15/19 1710 09/16/19 0604 09/17/19 0528 09/18/19 0442 09/20/19 0443  NA 134* 138 140  140 137  K 3.5 3.6 3.3* 3.7 3.7  CL 99 107 108 108 103  CO2 24 24 22 23 24   GLUCOSE 178* 139* 130* 156* 145*  BUN 21 21 20 20  25*  CREATININE 0.90 0.71 0.66 0.63 0.65  CALCIUM 8.2* 8.0* 8.0* 8.1* 8.1*  MG  --   --   --  2.2  --    Liver Function Tests: Recent Labs  Lab 09/15/19 1710 09/18/19 0442 09/20/19 0443  AST 27 25 27   ALT 27 31 38  ALKPHOS 63 71 76  BILITOT 1.6* 1.0 1.2  PROT 6.3* 5.6* 5.2*  ALBUMIN 3.4* 2.5* 2.5*   No results for input(s): LIPASE, AMYLASE in the last 168 hours. No results for input(s): AMMONIA in the last 168 hours. CBC: Recent Labs  Lab 09/15/19 1710 09/16/19 0604 09/17/19 0528 09/18/19 0442 09/20/19 0443  WBC 11.6* 8.4 8.3 8.7 10.4  NEUTROABS 9.6*  --   --  6.6  --   HGB 11.8* 10.1* 9.7* 10.1* 9.8*   HCT 35.3* 30.9* 29.5* 31.5* 30.6*  MCV 91.5 92.8 92.5 93.8 93.9  PLT 168 149* 163 178 219   Cardiac Enzymes: Recent Labs  Lab 09/18/19 0442  CKTOTAL 22*   BNP: Invalid input(s): POCBNP CBG: Recent Labs  Lab 09/17/19 1220 09/17/19 1652 09/18/19 0016 09/18/19 0616 09/18/19 1202  GLUCAP 137* 131* 122* 130* 131*   D-Dimer Recent Labs    09/18/19 0442  DDIMER 1.94*   Hgb A1c No results for input(s): HGBA1C in the last 72 hours. Lipid Profile No results for input(s): CHOL, HDL, LDLCALC, TRIG, CHOLHDL, LDLDIRECT in the last 72 hours. Thyroid function studies No results for input(s): TSH, T4TOTAL, T3FREE, THYROIDAB in the last 72 hours.  Invalid input(s): FREET3 Anemia work up No results for input(s): VITAMINB12, FOLATE, FERRITIN, TIBC, IRON, RETICCTPCT in the last 72 hours. Urinalysis    Component Value Date/Time   COLORURINE YELLOW 09/15/2019 1830   APPEARANCEUR CLEAR 09/15/2019 1830   LABSPEC 1.011 09/15/2019 1830   PHURINE 5.0 09/15/2019 1830   GLUCOSEU NEGATIVE 09/15/2019 1830   HGBUR NEGATIVE 09/15/2019 1830   BILIRUBINUR NEGATIVE 09/15/2019 1830   KETONESUR 20 (A) 09/15/2019 1830   PROTEINUR NEGATIVE 09/15/2019 1830   NITRITE NEGATIVE 09/15/2019 1830   LEUKOCYTESUR NEGATIVE 09/15/2019 1830   Sepsis Labs Invalid input(s): PROCALCITONIN,  WBC,  LACTICIDVEN Microbiology Recent Results (from the past 240 hour(s))  Culture, blood (routine x 2)     Status: None   Collection Time: 09/15/19  4:55 PM   Specimen: BLOOD LEFT FOREARM  Result Value Ref Range Status   Specimen Description   Final    BLOOD LEFT FOREARM Performed at [facility], [place] [address], [place], [place] [address]    Special Requests   Final    BOTTLES DRAWN AEROBIC ONLY Blood Culture adequate volume Performed at [facility], [address] [place], [place] [address]    Culture   Final    NO GROWTH 5 DAYS Performed at [facility], [place] [address] [place],  [address]    Report Status 09/20/2019 FINAL  Final  Culture, blood (routine x 2)     Status: None   Collection Time: 09/15/19  5:10 PM   Specimen: BLOOD LEFT FOREARM  Result Value Ref Range Status   Specimen Description   Final    BLOOD LEFT FOREARM Performed at [facility], [place] [address] [place], [place]  [address]    Special Requests   Final    BOTTLES DRAWN AEROBIC ONLY Blood Culture adequate volume Performed at [facility], [place] [address], [place], [place] [address]    Culture   Final    NO GROWTH 5 DAYS Performed at [facility], [place] [address] [place], [place] [address]    Report Status 09/20/2019 FINAL  Final  Urine culture     Status: None   Collection Time: 09/15/19  6:30 PM   Specimen: Urine, Random  Result Value Ref Range Status   Specimen Description   Final    URINE, RANDOM Performed at [facility], [place] [address], [place], [place] [address]    Special Requests   Final    NONE Performed at [facility], [place] [address], [place], [place] [address]    Culture   Final    NO GROWTH Performed at [facility], [place] [address], [place], [place] [address]    Report Status 09/16/2019 FINAL  Final  SARS CORONAVIRUS 2 (TAT 6-24 HRS) Nasopharyngeal Nasopharyngeal Swab     Status: None   Collection Time: 09/15/19  6:44 PM   Specimen: Nasopharyngeal Swab  Result Value Ref Range Status   SARS Coronavirus 2 NEGATIVE NEGATIVE Final    Comment: (NOTE) SARS-CoV-2 target nucleic acids are NOT DETECTED. The SARS-CoV-2 RNA is generally detectable in upper and lower respiratory specimens during the acute phase of infection. Negative results do not preclude SARS-CoV-2 infection, do not rule out co-infections with other pathogens, and should not be used as the sole basis for treatment or other patient management decisions. Negative results must be  combined with clinical observations, patient history, and epidemiological information. The expected result is Negative. Fact Sheet for Patients: SugarRoll.be Fact Sheet for Healthcare Providers: https://www.woods-Estephan Gallardo.com/ This test is not yet approved or cleared by the [place] FDA and  has been authorized for detection and/or diagnosis of SARS-CoV-2 by FDA under an Emergency Use Authorization (EUA). This EUA will remain  in effect (meaning this test can be used) for the duration of the COVID-19 declaration under Section 56 4(b)(1) of the Act, 21 U.S.C. section 360bbb-3(b)(1), unless the authorization is terminated or revoked sooner. Performed at [facility], [place] [address], [place], [place] [address]      Time coordinating discharge:  35 minutes  SIGNED:   Georgette Shell, MD  [facility] 09/20/2019, 11:12 AM Pager   If 7PM-7AM, please contact night-coverage [email or web address] Password [email or web address]

## 2019-09-20 NOTE — Progress Notes (Signed)
Sars result negative faxed to [phone] Mathew Simpson. Transport arranged [facility]

## 2019-09-20 NOTE — Progress Notes (Signed)
Attempted to call report to [facility] [phone]), transferred to voicemail after call answered. VM message left  For return call. Will follow up.

## 2019-09-20 NOTE — TOC Progression Note (Signed)
Transition of Care [facility]) - Progression Note    Patient Details  Name: KARAC LOOKABILL MRN: [id number] Date of Birth: [birth year]  Transition of Care [facility]) CM/SW Contact  Navreet Bolda, Juliann Pulse, RN Phone Number: 09/20/2019, 3:44 PM  Clinical Narrative:Await covid results-fax to [facility] attn [place] fax#[phone]. [facility] rep [employer] [employer] will accept patient @ any time.Nurse to contact [facility] for transport to [facility] will transport up till 10p tonight.        Expected Discharge Plan: Skilled Nursing Facility Barriers to Discharge: No Barriers Identified  Expected Discharge Plan and Services Expected Discharge Plan: [place]   Discharge Planning Services: CM Consult     Expected Discharge Date: (unknown)                                     Social Determinants of Health (SDOH) Interventions    Readmission Risk Interventions No flowsheet data found.

## 2019-09-20 NOTE — TOC Transition Note (Signed)
Transition of Care [facility]) - CM/SW Discharge Note   Patient Details  Name: Mathew Simpson MRN: [id number] Date of Birth: August 15, 1936  Transition of [facility]) CM/SW Contact:  Dessa Phi, RN Phone Number: 09/20/2019, 1:52 PM   Clinical Narrative:auth received from [place] [facility]-auth#[id number] 9/29-10/1-next update on 10/1 Erline Levine, fax update to [phone], [facility] rep Melissa aware will fax d/c summary once placed after covid test done. Spoke to dtr Laurie-voiced understanding. [facility] for transport await bed, & tel# to call report.       Final next level of care: Skilled Nursing Facility Barriers to Discharge: No Barriers Identified   Patient Goals and CMS Choice Patient states their goals for this hospitalization and ongoing recovery are:: go to rehab CMS Medicare.gov Compare Post Acute Care list provided to:: Patient Choice offered to / list presented to : Patient  Discharge Placement PASRR number recieved: 09/19/19            Patient chooses bed at: Other - please specify in the comment section below:([facility]) Patient to be transferred to facility by: [place] Name of family member notified: Margarita Grizzle Patient and family notified of of transfer: 09/20/19  Discharge Plan and Services   Discharge Planning Services: CM Consult                                 Social Determinants of Health ([place]) Interventions     Readmission Risk Interventions No flowsheet data found.

## 2019-09-20 NOTE — TOC Transition Note (Signed)
Transition of Care [facility]) - CM/SW Discharge Note   Patient Details  Name: Mathew Simpson MRN: [id number] Date of Birth: 15-Jan-1936  Transition of Care [facility]) CM/SW Contact:  Dessa Phi, RN Phone Number: 09/20/2019, 2:35 PM   Clinical Narrative: d/c today await covid results,then can d/c to [facility]-going to rm/bed#138, Nurse call report to tel#[phone]. Await d/c summary.Transport by [employer].     Final next level of care: Skilled Nursing Facility Barriers to Discharge: No Barriers Identified   Patient Goals and CMS Choice Patient states their goals for this hospitalization and ongoing recovery are:: go to rehab CMS Medicare.gov Compare Post Acute Care list provided to:: Patient Choice offered to / list presented to : Patient  Discharge Placement PASRR number recieved: 09/19/19            Patient chooses bed at: Other - please specify in the comment section below:([facility]) Patient to be transferred to facility by: [place] Name of family member notified: Margarita Grizzle Patient and family notified of of transfer: 09/20/19  Discharge Plan and Services   Discharge Planning Services: CM Consult                                 Social Determinants of Health ([place]) Interventions     Readmission Risk Interventions No flowsheet data found.

## 2019-09-20 NOTE — Evaluation (Signed)
Occupational Therapy Evaluation Patient Details Name: Mathew Simpson MRN: [id number] DOB: 1936-05-21 Today's Date: 09/20/2019    History of Present Illness Pt admitted with acute encephalopathy and L TKR hemarthrosis; Pt is s/p R TKR 09/12/19   Clinical Impression   Pt admitted with above diagnoses, limited by LLE pain from L TKR complications. Prior to TKR on 9/21 pt was ind with mobility and BADL. He was only home with a short time with dtr and states he was unable to define a new baseline before complications began. At time of eval, he is a min A for bed mobility and max A for sit <>stand and SPTs. Pt able to don RLE bed level, total A for LLE. Given current functional level, recommend SNF at d/c. Pt will benefit from acute OT, will continue to follow per POC listed below.    Follow Up Recommendations  SNF;Supervision/Assistance - 24 hour    Equipment Recommendations  None recommended by OT    Recommendations for Other Services       Precautions / Restrictions Precautions Precautions: Fall;Knee Restrictions Weight Bearing Restrictions: No Other Position/Activity Restrictions: WBAT      Mobility Bed Mobility Overal bed mobility: Needs Assistance Bed Mobility: Supine to Sit     Supine to sit: Min assist     General bed mobility comments: cues for BLE translation to EOB, assist to bring trunk upright  Transfers Overall transfer level: Needs assistance Equipment used: Rolling walker (2 wheeled) Transfers: Sit to/from Stand Sit to Stand: Max assist         General transfer comment: max A to rise and steady with RW, slow moving with step by step sequencing to bed    Balance Overall balance assessment: Needs assistance Sitting-balance support: Feet supported;No upper extremity supported Sitting balance-Leahy Scale: Fair     Standing balance support: Bilateral upper extremity supported Standing balance-Leahy Scale: Poor Standing balance comment: pt requires UE  support for standing balance                           ADL either performed or assessed with clinical judgement   ADL Overall ADL's : Needs assistance/impaired Eating/Feeding: Set up;Sitting   Grooming: Set up;Sitting   Upper Body Bathing: Set up;Sitting   Lower Body Bathing: Maximal assistance;Sit to/from stand;Sitting/lateral leans   Upper Body Dressing : Set up;Sitting   Lower Body Dressing: Total assistance;Sitting/lateral leans;Sit to/from stand   Toilet Transfer: Maximal assistance;Stand-pivot;BSC;RW   Toileting- Clothing Manipulation and Hygiene: Total assistance;Sitting/lateral lean;Sit to/from stand       Functional mobility during ADLs: Maximal assistance;Rolling walker(SPT) General ADL Comments: pt ltd 2/2 LLE knee pain and RLE knee stiffness from baseline     Vision   Vision Assessment?: No apparent visual deficits     Perception     Praxis      Pertinent Vitals/Pain Pain Assessment: 0-10 Faces Pain Scale: Hurts little more Pain Location: L knee and L ankle Pain Descriptors / Indicators: Aching;Sore;Throbbing Pain Intervention(s): Monitored during session;Repositioned;Ice applied     Hand Dominance     Extremity/Trunk Assessment Upper Extremity Assessment Upper Extremity Assessment: Overall WFL for tasks assessed   Lower Extremity Assessment Lower Extremity Assessment: Defer to PT evaluation       Communication Communication Communication: No difficulties   Cognition Arousal/Alertness: Awake/alert Behavior During Therapy: WFL for tasks assessed/performed Overall Cognitive Status: Within Functional Limits for tasks assessed  General Comments       Exercises     Shoulder Instructions      Home Living Family/patient expects to be discharged to:: Skilled nursing facility                                 Additional Comments: pt was staying with dtr, now  anticipates d/c to SNF      Prior Functioning/Environment Level of Independence: Independent with assistive device(s)        Comments: prior to TKR 9/21 pt was ind with devices        OT Problem List: Decreased strength;Decreased knowledge of use of DME or AE;Decreased activity tolerance;Impaired balance (sitting and/or standing);Pain      OT Treatment/Interventions: Self-care/ADL training;Therapeutic exercise;Patient/family education;Balance training;Therapeutic activities;Energy conservation;DME and/or AE instruction    OT Goals(Current goals can be found in the care plan section) Acute Rehab OT Goals Patient Stated Goal: return to ind OT Goal Formulation: With patient Time For Goal Achievement: 10/04/19 Potential to Achieve Goals: Good  OT Frequency: Min 2X/week   Barriers to D/C:            Co-evaluation              AM-PAC OT "6 Clicks" Daily Activity     Outcome Measure Help from another person eating meals?: None Help from another person taking care of personal grooming?: None Help from another person toileting, which includes using toliet, bedpan, or urinal?: A Lot Help from another person bathing (including washing, rinsing, drying)?: A Lot Help from another person to put on and taking off regular upper body clothing?: None Help from another person to put on and taking off regular lower body clothing?: A Lot 6 Click Score: 18   End of Session Equipment Utilized During Treatment: Gait belt;Rolling walker Nurse Communication: Mobility status  Activity Tolerance: Patient tolerated treatment well Patient left: in chair;with call bell/phone within reach;with chair alarm set  OT Visit Diagnosis: Other abnormalities of gait and mobility (R26.89);Unsteadiness on feet (R26.81);Pain Pain - Right/Left: Left Pain - part of body: Knee;Leg                Time: [id number] OT Time Calculation (min): 24 min Charges:  OT General Charges $OT Visit: 1 Visit OT  Evaluation $OT Eval Moderate Complexity: 1 Mod OT Treatments $Self Care/Home Management : 8-22 mins  Zenovia Jarred, MSOT, OTR/L Behavioral Health OT/ Acute Relief OT [facility] Office: [phone]  Zenovia Jarred 09/20/2019, 4:07 PM

## 2019-09-21 ENCOUNTER — Encounter (INDEPENDENT_AMBULATORY_CARE_PROVIDER_SITE_OTHER): Payer: Medicare HMO | Admitting: Ophthalmology

## 2019-09-27 ENCOUNTER — Encounter (INDEPENDENT_AMBULATORY_CARE_PROVIDER_SITE_OTHER): Payer: Medicare HMO | Admitting: Ophthalmology

## 2019-10-11 ENCOUNTER — Encounter (INDEPENDENT_AMBULATORY_CARE_PROVIDER_SITE_OTHER): Payer: Medicare HMO | Admitting: Ophthalmology

## 2019-10-20 ENCOUNTER — Other Ambulatory Visit: Payer: Self-pay

## 2019-10-20 ENCOUNTER — Encounter (INDEPENDENT_AMBULATORY_CARE_PROVIDER_SITE_OTHER): Payer: Medicare HMO | Admitting: Ophthalmology

## 2019-10-20 DIAGNOSIS — I10 Essential (primary) hypertension: Secondary | ICD-10-CM | POA: Diagnosis not present

## 2019-10-20 DIAGNOSIS — H35033 Hypertensive retinopathy, bilateral: Secondary | ICD-10-CM | POA: Diagnosis not present

## 2019-10-20 DIAGNOSIS — H353132 Nonexudative age-related macular degeneration, bilateral, intermediate dry stage: Secondary | ICD-10-CM | POA: Diagnosis not present

## 2019-10-20 DIAGNOSIS — H34832 Tributary (branch) retinal vein occlusion, left eye, with macular edema: Secondary | ICD-10-CM | POA: Diagnosis not present

## 2019-10-20 DIAGNOSIS — H43813 Vitreous degeneration, bilateral: Secondary | ICD-10-CM

## 2019-12-08 ENCOUNTER — Other Ambulatory Visit: Payer: Self-pay

## 2019-12-08 ENCOUNTER — Encounter (INDEPENDENT_AMBULATORY_CARE_PROVIDER_SITE_OTHER): Payer: Medicare HMO | Admitting: Ophthalmology

## 2019-12-08 DIAGNOSIS — H35372 Puckering of macula, left eye: Secondary | ICD-10-CM

## 2019-12-08 DIAGNOSIS — D3131 Benign neoplasm of right choroid: Secondary | ICD-10-CM

## 2019-12-08 DIAGNOSIS — H353132 Nonexudative age-related macular degeneration, bilateral, intermediate dry stage: Secondary | ICD-10-CM | POA: Diagnosis not present

## 2019-12-08 DIAGNOSIS — H43813 Vitreous degeneration, bilateral: Secondary | ICD-10-CM

## 2019-12-08 DIAGNOSIS — I10 Essential (primary) hypertension: Secondary | ICD-10-CM | POA: Diagnosis not present

## 2019-12-08 DIAGNOSIS — H34832 Tributary (branch) retinal vein occlusion, left eye, with macular edema: Secondary | ICD-10-CM

## 2019-12-08 DIAGNOSIS — H35033 Hypertensive retinopathy, bilateral: Secondary | ICD-10-CM

## 2020-01-14 ENCOUNTER — Ambulatory Visit: Payer: Medicare HMO | Attending: Internal Medicine

## 2020-01-14 DIAGNOSIS — Z23 Encounter for immunization: Secondary | ICD-10-CM

## 2020-01-14 NOTE — Progress Notes (Signed)
   Covid-19 Vaccination Clinic  Name:  Mathew Simpson    MRN: [id number] DOB: 09/13/1936  01/14/2020  [name] was observed post Covid-19 immunization for 30 minutes based on pre-vaccination screening without incidence. He was provided with Vaccine Information Sheet and instruction to access the V-Safe system.   [name] was instructed to call 911 with any severe reactions post vaccine: Marland Kitchen Difficulty breathing  . Swelling of your face and throat  . A fast heartbeat  . A bad rash all over your body  . Dizziness and weakness    Immunizations Administered    Name Date Dose VIS Date Route   Pfizer COVID-19 Vaccine 01/14/2020 11:12 AM 0.3 mL 12/02/2019 Intramuscular   Manufacturer: [place]   Lot: [id number]   [place]: [id number]

## 2020-01-23 ENCOUNTER — Inpatient Hospital Stay: Admit: 2020-01-23 | Payer: Medicare HMO | Admitting: Orthopedic Surgery

## 2020-01-23 SURGERY — ARTHROPLASTY, KNEE, TOTAL
Anesthesia: Choice | Site: Knee | Laterality: Right

## 2020-02-02 ENCOUNTER — Encounter (INDEPENDENT_AMBULATORY_CARE_PROVIDER_SITE_OTHER): Payer: Medicare HMO | Admitting: Ophthalmology

## 2020-02-02 DIAGNOSIS — H34832 Tributary (branch) retinal vein occlusion, left eye, with macular edema: Secondary | ICD-10-CM | POA: Diagnosis not present

## 2020-02-02 DIAGNOSIS — I10 Essential (primary) hypertension: Secondary | ICD-10-CM | POA: Diagnosis not present

## 2020-02-02 DIAGNOSIS — H35372 Puckering of macula, left eye: Secondary | ICD-10-CM

## 2020-02-02 DIAGNOSIS — H35033 Hypertensive retinopathy, bilateral: Secondary | ICD-10-CM

## 2020-02-02 DIAGNOSIS — H43813 Vitreous degeneration, bilateral: Secondary | ICD-10-CM

## 2020-02-02 DIAGNOSIS — H353132 Nonexudative age-related macular degeneration, bilateral, intermediate dry stage: Secondary | ICD-10-CM | POA: Diagnosis not present

## 2020-02-02 DIAGNOSIS — D3131 Benign neoplasm of right choroid: Secondary | ICD-10-CM

## 2020-02-04 ENCOUNTER — Ambulatory Visit: Payer: Medicare HMO | Attending: Internal Medicine

## 2020-02-04 DIAGNOSIS — Z23 Encounter for immunization: Secondary | ICD-10-CM | POA: Insufficient documentation

## 2020-02-04 NOTE — Progress Notes (Signed)
   Covid-19 Vaccination Clinic  Name:  Mathew Simpson    MRN: [id number] DOB: Aug 16, 1936  02/04/2020  [name] was observed post Covid-19 immunization for 30 minutes based on pre-vaccination screening without incidence. He was provided with Vaccine Information Sheet and instruction to access the V-Safe system.   [name] was instructed to call 911 with any severe reactions post vaccine: Marland Kitchen Difficulty breathing  . Swelling of your face and throat  . A fast heartbeat  . A bad rash all over your body  . Dizziness and weakness    Immunizations Administered    Name Date Dose VIS Date Route   Pfizer COVID-19 Vaccine 02/04/2020  9:41 AM 0.3 mL 12/02/2019 Intramuscular   Manufacturer: [place]   Lot: [id number]   [place]: [id number]

## 2020-02-13 ENCOUNTER — Ambulatory Visit: Payer: Medicare HMO

## 2020-02-28 NOTE — H&P (Signed)
TOTAL KNEE ADMISSION H&P  Patient is being admitted for right total knee arthroplasty.  Subjective:  Chief Complaint: Right knee pain.  HPI: Mathew Simpson, 84 y.o. male has a history of pain and functional disability in the right knee due to arthritis and has failed non-surgical conservative treatments for greater than 12 weeks to include corticosteriod injections, viscosupplementation injections and activity modification. Onset of symptoms was gradual, starting >10 years ago with gradually worsening course since that time. The patient noted no past surgery on the right knee.  Patient currently rates pain in the right knee at 7 out of 10 with activity. Patient has worsening of pain with activity and weight bearing, pain that interferes with activities of daily living, crepitus, joint swelling and instability. Patient has evidence of severe bone-on-bone arthritis in the medial patellofemoral compartments with significant varus deformity by imaging studies. There is no active infection.  Patient Active Problem List   Diagnosis Date Noted  . Cellulitis of left lower extremity   . Septic joint of left knee joint ([place]) 09/15/2019  . Acute blood loss anemia 09/15/2019  . Essential hypertension 09/15/2019  . OA (osteoarthritis) of knee 09/12/2019    Past Medical History:  Diagnosis Date  . Arthritis   . DJD (degenerative joint disease)   . Dysrhythmia   . Glaucoma   . Hypertension     Past Surgical History:  Procedure Laterality Date  . EYE SURGERY     bil   . JOINT REPLACEMENT    . knee surgery     arthroscopic  . TOTAL KNEE ARTHROPLASTY Left 09/12/2019   Procedure: TOTAL KNEE ARTHROPLASTY;  Surgeon: Gaynelle Arabian, MD;  Location: [facility];  Service: Orthopedics;  Laterality: Left;  76min    Prior to Admission medications   Medication Sig Start Date End Date Taking? Authorizing Provider  apixaban (ELIQUIS) 5 MG TABS tablet Take 5 mg by mouth 2 (two) times daily.    [provider]  Calcium Citrate-Vitamin D (CALCIUM + D PO) Take 1 tablet by mouth 2 (two) times daily.    [provider]  Cholecalciferol (DIALYVITE VITAMIN D 5000) 125 MCG (5000 UT) capsule Take 5,000 Units by mouth daily.    [provider]  diltiazem (TIAZAC) 240 MG 24 hr capsule Take 240 mg by mouth daily. 04/12/19   [provider]  gabapentin (NEURONTIN) 300 MG capsule Take 300 mg by mouth 3 (three) times daily.    [provider]  HYDROcodone-acetaminophen (NORCO) 7.5-325 MG tablet Take 1 tablet by mouth every 6 (six) hours as needed for severe pain. 09/20/19   Georgette Shell, MD  lisinopril (ZESTRIL) 20 MG tablet Take 0.5 tablets (10 mg total) by mouth daily. 09/20/19   Georgette Shell, MD  methocarbamol (ROBAXIN) 500 MG tablet Take 1 tablet (500 mg total) by mouth every 6 (six) hours as needed for muscle spasms. 09/13/19   Maurice March, PA-C  Multiple Vitamin (MULTIVITAMIN WITH MINERALS) TABS tablet Take 1 tablet by mouth daily.    [provider]  Multiple Vitamins-Minerals (PRESERVISION AREDS 2) CAPS Take 1 capsule by mouth 2 (two) times daily.    [provider]  trimethoprim-polymyxin b (POLYTRIM) ophthalmic solution Place 1 drop into the left eye See admin instructions. Instill 1 drop into left eye 4 times daily for 2 days after eye injection 04/07/19   [provider]    Allergies  Allergen Reactions  . Penicillins Anaphylaxis    Did it  involve swelling of the face/tongue/throat, SOB, or low BP? Yes Did it involve sudden or severe rash/hives, skin peeling, or any reaction on the inside of your mouth or nose? Unknown Did you need to seek medical attention at a hospital or doctor's office? Yes When did it last happen?50+ years If all above answers are "NO", may proceed with cephalosporin use.     Social History   Socioeconomic History  . Marital status: Single    Spouse name: Not on file  .  Number of children: Not on file  . Years of education: Not on file  . Highest education level: Not on file  Occupational History  . Not on file  Tobacco Use  . Smoking status: Former Research scientist (life sciences)  . Smokeless tobacco: Never Used  . Tobacco comment: quit in 20's  Substance and Sexual Activity  . Alcohol use: Not Currently  . Drug use: Not Currently  . Sexual activity: Not Currently  Other Topics Concern  . Not on file  Social History Narrative  . Not on file   Social Determinants of Health   Financial Resource Strain:   . Difficulty of Paying Living Expenses: Not on file  Food Insecurity:   . Worried About Charity fundraiser in the Last Year: Not on file  . Ran Out of Food in the Last Year: Not on file  Transportation Needs:   . Lack of Transportation (Medical): Not on file  . Lack of Transportation (Non-Medical): Not on file  Physical Activity:   . Days of Exercise per Week: Not on file  . Minutes of Exercise per Session: Not on file  Stress:   . Feeling of Stress : Not on file  Social Connections:   . Frequency of Communication with Friends and Family: Not on file  . Frequency of Social Gatherings with Friends and Family: Not on file  . Attends Religious Services: Not on file  . Active Member of Clubs or Organizations: Not on file  . Attends Archivist Meetings: Not on file  . Marital Status: Not on file  Intimate Partner Violence:   . Fear of Current or Ex-Partner: Not on file  . Emotionally Abused: Not on file  . Physically Abused: Not on file  . Sexually Abused: Not on file      Tobacco Use: Medium Risk  . Smoking Tobacco Use: Former Smoker  . Smokeless Tobacco Use: Never Used   Social History   Substance and Sexual Activity  Alcohol Use Not Currently    Family History  Family history unknown: Yes    Review of Systems  Constitutional: Negative for chills and fever.  HENT: Negative for congestion, sore throat and tinnitus.   Eyes: Negative for  double vision, photophobia and pain.  Respiratory: Negative for cough, shortness of breath and wheezing.   Cardiovascular: Negative for chest pain, palpitations and orthopnea.  Gastrointestinal: Negative for heartburn, nausea and vomiting.  Genitourinary: Negative for dysuria, frequency and urgency.  Musculoskeletal: Positive for joint pain.  Neurological: Negative for dizziness, weakness and headaches.    Objective:  Physical Exam: Well nourished and well developed.  General: Alert and oriented x3, cooperative and pleasant, no acute distress.  Head: normocephalic, atraumatic, neck supple.  Eyes: EOMI.  Respiratory: breath sounds clear in all fields, no wheezing, rales, or rhonchi. Cardiovascular: IRREGULARLY IRREGULAR rate and rhythm, no murmurs, gallops or rubs.  Abdomen: non-tender to palpation and soft, normoactive bowel sounds. Musculoskeletal:  Right Knee Exam:  Shows significant  varus deformity.  No effusion present. No swelling present. The range of motion is: 5 to 125 degrees.  No crepitus on range of motion of the knee.  Medial joint line tenderness. Lateral joint line tenderness.  The knee is stable.  Calves soft and nontender. Motor function intact in LE. Strength 5/5 LE bilaterally. Neuro: Distal pulses 2+. Sensation to light touch intact in LE.  Vital signs in last 24 hours: Blood pressure: 140/84 mmHg Pulse: 72 bpm  Imaging Review Plain radiographs demonstrate severe degenerative joint disease of the right knee. The overall alignment is significant varus. The bone quality appears to be adequate for age and reported activity level.  Assessment/Plan:  End stage arthritis, right knee   The patient history, physical examination, clinical judgment of the provider and imaging studies are consistent with end stage degenerative joint disease of the right knee and total knee arthroplasty is deemed medically necessary. The treatment options including medical  management, injection therapy arthroscopy and arthroplasty were discussed at length. The risks and benefits of total knee arthroplasty were presented and reviewed. The risks due to aseptic loosening, infection, stiffness, patella tracking problems, thromboembolic complications and other imponderables were discussed. The patient acknowledged the explanation, agreed to proceed with the plan and consent was signed. Patient is being admitted for inpatient treatment for surgery, pain control, PT, OT, prophylactic antibiotics, VTE prophylaxis, progressive ambulation and ADLs and discharge planning. The patient is planning to be discharged home.  Anticipated LOS equal to or greater than 2 midnights due to - Age 61 and older with one or more of the following:  - Obesity  - Expected need for hospital services (PT, OT, Nursing) required for safe  discharge  - Anticipated need for postoperative skilled nursing care or inpatient rehab  - Active co-morbidities: Chronic pain requiring opiods and Cardiac Arrhythmia OR   - Unanticipated findings during/Post Surgery: None  - Patient is a high risk of re-admission due to: None  Therapy Plans: Outpatient therapy at [facility] vs [facility] Disposition: Home with daughter Planned DVT Prophylaxis: Eliquis 5 mg BID (hx atrial fibrillation) DME Needed: None PCP: Roque Cash, PA-C (has appointment 3/11) TXA: IV Allergies: PCN (anaphylaxis) Anesthesia Concerns: None BMI: 27.8  Other:  - Because of his issues after the last surgery (narcotic related encephalopathy), he will need to stay at least 48 hours in the hospital. - Post-operative pain management will be complicated by chronic opioid use (7.5 hydro TID)  - Patient was instructed on what medications to stop prior to surgery. - Follow-up visit in 2 weeks with Dr. Wynelle Link - Begin physical therapy following surgery - Pre-operative lab work as pre-surgical testing - Prescriptions will be provided in hospital at  time of discharge  Theresa Duty, PA-C Orthopedic Surgery [facility] [place]

## 2020-03-01 NOTE — Patient Instructions (Addendum)
DUE TO COVID-19 ONLY ONE VISITOR IS ALLOWED TO COME WITH YOU AND STAY IN THE WAITING ROOM ONLY DURING PRE OP AND PROCEDURE DAY OF SURGERY. THE 1 VISITOR MAY VISIT WITH YOU AFTER SURGERY IN YOUR PRIVATE ROOM DURING VISITING HOURS ONLY!  YOU NEED TO HAVE A COVID 19 TEST ON  3/18/21_______ @__10 :30_____, THIS TEST MUST BE DONE BEFORE SURGERY, COME  [facility] [place] , [id number].  ([place]) ONCE YOUR COVID TEST IS COMPLETED, PLEASE BEGIN THE QUARANTINE INSTRUCTIONS AS OUTLINED IN YOUR HANDOUT.                Army [place]aco    Your procedure is scheduled on:03/12/20    Report to [facility]  Entrance   Report to admitting at 10:10 AM     Call this number if you have problems the morning of surgery [place], NO CHEWING GUM [place].    Do not eat food After Midnight.    YOU MAY HAVE CLEAR LIQUIDS FROM MIDNIGHT UNTIL 9:30 AM.    CLEAR LIQUID DIET   Foods Allowed                                                                     Foods Excluded  Coffee and tea, regular and decaf                             liquids that you cannot  Plain Jell-O any favor except red or purple                                           see through such as: Fruit ices (not with fruit pulp)                                     milk, soups, orange juice  Iced Popsicles                                    All solid food Carbonated beverages, regular and diet                                    Cranberry, grape and apple juices Sports drinks like Gatorade Lightly seasoned clear broth or consume(fat free) Sugar, honey syrup      At 9:30 AM Please finish the prescribed Pre-Surgery  Drink.   Nothing by mouth after you finish the  drink !   Take these medicines the morning of surgery with A SIP OF WATER: Diltiazem, Hydrocodone, eye drops                                 You may not have any  metal on your body including              piercings  Do not wear jewelry,  lotions, powders or deodorant                    Men may shave face and neck.   Do not bring valuables to the hospital. [place].  Contacts, dentures or bridgework may not be worn into surgery.       Special Instructions: N/A              Please read over the following fact sheets you were given: _____________________________________________________________________             [facility] - Preparing for Surgery  Before surgery, you can play an important role.   Because skin is not sterile, your skin needs to be as free of germs as possible.   You can reduce the number of germs on your skin by washing with CHG (chlorahexidine gluconate) soap before surgery.   CHG is an antiseptic cleaner which kills germs and bonds with the skin to continue killing germs even after washing. Please DO NOT use if you have an allergy to CHG or antibacterial soaps.   If your skin becomes reddened/irritated stop using the CHG and inform your nurse when you arrive at Short Stay.  You may shave your face/neck.  Please follow these instructions carefully:  1.  Shower with CHG Soap the night before surgery and the  morning of Surgery.  2.  If you choose to wash your hair, wash your hair first as usual with your  normal  shampoo.  3.  After you shampoo, rinse your hair and body thoroughly to remove the  shampoo.                                        4.  Use CHG as you would any other liquid soap.  You can apply chg directly  to the skin and wash                       Gently with a scrungie or clean washcloth.  5.  Apply the CHG Soap to your body ONLY FROM THE NECK DOWN.   Do not use on face/ open                           Wound or open sores. Avoid contact with eyes, ears mouth and genitals (private parts).                       Wash face,  Genitals (private parts) with your normal  soap.             6.  Wash thoroughly, paying special attention to the area where your surgery  will be performed.  7.  Thoroughly rinse your body with warm water from the neck down.  8.  DO NOT shower/wash with your normal soap after using and rinsing off  the CHG Soap.             9.  Pat yourself dry with a clean towel.  10.  Wear clean pajamas.            11.  Place clean sheets on your bed the night of your first shower and do not  sleep with pets. Day of Surgery : Do not apply any lotions/deodorants the morning of surgery.  Please wear clean clothes to the hospital/surgery center.  FAILURE TO FOLLOW THESE INSTRUCTIONS MAY RESULT IN THE CANCELLATION OF YOUR SURGERY PATIENT SIGNATURE_________________________________  NURSE SIGNATURE__________________________________  ________________________________________________________________________   Adam Phenix  An incentive spirometer is a tool that can help keep your lungs clear and active. This tool measures how well you are filling your lungs with each breath. Taking long deep breaths may help reverse or decrease the chance of developing breathing (pulmonary) problems (especially infection) following:  A long period of time when you are unable to move or be active. BEFORE THE PROCEDURE   If the spirometer includes an indicator to show your best effort, your nurse or respiratory therapist will set it to a desired goal.  If possible, sit up straight or lean slightly forward. Try not to slouch.  Hold the incentive spirometer in an upright position. INSTRUCTIONS FOR USE  1. Sit on the edge of your bed if possible, or sit up as far as you can in bed or on a chair. 2. Hold the incentive spirometer in an upright position. 3. Breathe out normally. 4. Place the mouthpiece in your mouth and seal your lips tightly around it. 5. Breathe in slowly and as deeply as possible, raising the piston or the ball toward the top of the  column. 6. Hold your breath for 3-5 seconds or for as long as possible. Allow the piston or ball to fall to the bottom of the column. 7. Remove the mouthpiece from your mouth and breathe out normally. 8. Rest for a few seconds and repeat Steps 1 through 7 at least 10 times every 1-2 hours when you are awake. Take your time and take a few normal breaths between deep breaths. 9. The spirometer may include an indicator to show your best effort. Use the indicator as a goal to work toward during each repetition. 10. After each set of 10 deep breaths, practice coughing to be sure your lungs are clear. If you have an incision (the cut made at the time of surgery), support your incision when coughing by placing a pillow or rolled up towels firmly against it. Once you are able to get out of bed, walk around indoors and cough well. You may stop using the incentive spirometer when instructed by your caregiver.  RISKS AND COMPLICATIONS  Take your time so you do not get dizzy or light-headed.  If you are in pain, you may need to take or ask for pain medication before doing incentive spirometry. It is harder to take a deep breath if you are having pain. AFTER USE  Rest and breathe slowly and easily.  It can be helpful to keep track of a log of your progress. Your caregiver can provide you with a simple table to help with this. If you are using the spirometer at home, follow these instructions: [place] IF:   You are having difficultly using the spirometer.  You have trouble using the spirometer as often as instructed.  Your pain medication is not giving enough relief while using the spirometer.  You develop fever of 100.5 F (38.1 C) or higher. SEEK IMMEDIATE MEDICAL CARE IF:   You cough up bloody sputum  that had not been present before.  You develop fever of 102 F (38.9 C) or greater.  You develop worsening pain at or near the incision site. MAKE SURE YOU:   Understand these  instructions.  Will watch your condition.  Will get help right away if you are not doing well or get worse. Document Released: 04/20/2007 Document Revised: 03/01/2012 Document Reviewed: 06/21/2007 ExitCare Patient Information 2014 Exit[employer], [place].   ________________________________________________________________________  WHAT IS A BLOOD TRANSFUSION? Blood Transfusion Information  A transfusion is the replacement of blood or some of its parts. Blood is made up of multiple cells which provide different functions.  Red blood cells carry oxygen and are used for blood loss replacement.  White blood cells fight against infection.  Platelets control bleeding.  Plasma helps clot blood.  Other blood products are available for specialized needs, such as hemophilia or other clotting disorders. BEFORE THE TRANSFUSION  Who gives blood for transfusions?   Healthy volunteers who are fully evaluated to make sure their blood is safe. This is blood bank blood. Transfusion therapy is the safest it has ever been in the practice of medicine. Before blood is taken from a donor, a complete history is taken to make sure that person has no history of diseases nor engages in risky social behavior (examples are intravenous drug use or sexual activity with multiple partners). The donor's travel history is screened to minimize risk of transmitting infections, such as malaria. The donated blood is tested for signs of infectious diseases, such as HIV and hepatitis. The blood is then tested to be sure it is compatible with you in order to minimize the chance of a transfusion reaction. If you or a relative donates blood, this is often done in anticipation of surgery and is not appropriate for emergency situations. It takes many days to process the donated blood. RISKS AND COMPLICATIONS Although transfusion therapy is very safe and saves many lives, the main dangers of transfusion include:   Getting an infectious  disease.  Developing a transfusion reaction. This is an allergic reaction to something in the blood you were given. Every precaution is taken to prevent this. The decision to have a blood transfusion has been considered carefully by your caregiver before blood is given. Blood is not given unless the benefits outweigh the risks. AFTER THE TRANSFUSION  Right after receiving a blood transfusion, you will usually feel much better and more energetic. This is especially true if your red blood cells have gotten low (anemic). The transfusion raises the level of the red blood cells which carry oxygen, and this usually causes an energy increase.  The nurse administering the transfusion will monitor you carefully for complications. HOME CARE INSTRUCTIONS  No special instructions are needed after a transfusion. You may find your energy is better. Speak with your caregiver about any limitations on activity for underlying diseases you may have. SEEK MEDICAL CARE IF:   Your condition is not improving after your transfusion.  You develop redness or irritation at the intravenous (IV) site. SEEK IMMEDIATE MEDICAL CARE IF:  Any of the following symptoms occur over the next 12 hours:  Shaking chills.  You have a temperature by mouth above 102 F (38.9 C), not controlled by medicine.  Chest, back, or muscle pain.  People around you feel you are not acting correctly or are confused.  Shortness of breath or difficulty breathing.  Dizziness and fainting.  You get a rash or develop hives.  You have a decrease  in urine output.  Your urine turns a dark color or changes to pink, red, or brown. Any of the following symptoms occur over the next 10 days:  You have a temperature by mouth above 102 F (38.9 C), not controlled by medicine.  Shortness of breath.  Weakness after normal activity.  The white part of the eye turns yellow (jaundice).  You have a decrease in the amount of urine or are  urinating less often.  Your urine turns a dark color or changes to pink, red, or brown. Document Released: 12/05/2000 Document Revised: 03/01/2012 Document Reviewed: 07/24/2008 [facility] Patient Information 2014 [place], [place].  _______________________________________________________________________

## 2020-03-02 ENCOUNTER — Encounter (HOSPITAL_COMMUNITY)
Admission: RE | Admit: 2020-03-02 | Discharge: 2020-03-02 | Disposition: A | Discharge status: Home / Self Care | Payer: Medicare HMO | Source: Ambulatory Visit | Attending: Orthopedic Surgery | Admitting: Orthopedic Surgery

## 2020-03-02 ENCOUNTER — Encounter (HOSPITAL_COMMUNITY): Payer: Self-pay

## 2020-03-02 ENCOUNTER — Other Ambulatory Visit: Payer: Self-pay

## 2020-03-02 DIAGNOSIS — Z01812 Encounter for preprocedural laboratory examination: Secondary | ICD-10-CM | POA: Diagnosis not present

## 2020-03-02 LAB — CBC
HCT: 41.9 % (ref 39.0–52.0)
Hemoglobin: 13.7 g/dL (ref 13.0–17.0)
MCH: 29.5 pg (ref 26.0–34.0)
MCHC: 32.7 g/dL (ref 30.0–36.0)
MCV: 90.3 fL (ref 80.0–100.0)
Platelets: 199 10*3/uL (ref 150–400)
RBC: 4.64 MIL/uL (ref 4.22–5.81)
RDW: 14.2 % (ref 11.5–15.5)
WBC: 6.2 10*3/uL (ref 4.0–10.5)
nRBC: 0 % (ref 0.0–0.2)

## 2020-03-02 LAB — COMPREHENSIVE METABOLIC PANEL
ALT: 17 U/L (ref 0–44)
AST: 23 U/L (ref 15–41)
Albumin: 4 g/dL (ref 3.5–5.0)
Alkaline Phosphatase: 78 U/L (ref 38–126)
Anion gap: 10 (ref 5–15)
BUN: 23 mg/dL (ref 8–23)
CO2: 25 mmol/L (ref 22–32)
Calcium: 9.1 mg/dL (ref 8.9–10.3)
Chloride: 107 mmol/L (ref 98–111)
Creatinine, Ser: 1.03 mg/dL (ref 0.61–1.24)
GFR calc Af Amer: >60 mL/min (ref >60)
GFR calc non Af Amer: >60 mL/min (ref >60)
Glucose, Bld: 208 mg/dL — ABNORMAL HIGH (ref 70–99)
Potassium: 3.9 mmol/L (ref 3.5–5.1)
Sodium: 142 mmol/L (ref 135–145)
Total Bilirubin: 0.7 mg/dL (ref 0.3–1.2)
Total Protein: 6.5 g/dL (ref 6.5–8.1)

## 2020-03-02 LAB — SURGICAL PCR SCREEN
MRSA, PCR: NEGATIVE
Staphylococcus aureus: NEGATIVE

## 2020-03-02 LAB — PROTIME-INR
INR: 1.2 (ref 0.8–1.2)
Prothrombin Time: 15.1 seconds (ref 11.4–15.2)

## 2020-03-02 LAB — APTT: aPTT: 26 seconds (ref 24–36)

## 2020-03-02 NOTE — Progress Notes (Signed)
PCP - Dr. Glean Hess Cardiologist - Dr. Lennice Sites  Chest x-ray - 09/15/19 EKG - 09/16/19 Stress Test - no ECHO - no Cardiac Cath - no  Sleep Study - no CPAP -   Fasting Blood Sugar - NA Checks Blood Sugar _____ times a day  Blood Thinner Instructions:Eliquis for chronic A-fib Aspirin Instructions:Dr. Aluisio said to stop it 3 days prior to DOS.  Last Dose:03/09/20  Anesthesia review:   Patient denies shortness of breath, fever, cough and chest pain at PAT appointment yes  Patient verbalized understanding of instructions that were given to them at the PAT appointment. Patient was also instructed that they will need to review over the PAT instructions again at home before surgery. yes

## 2020-03-08 ENCOUNTER — Other Ambulatory Visit (HOSPITAL_COMMUNITY)
Admission: RE | Admit: 2020-03-08 | Discharge: 2020-03-08 | Disposition: A | Discharge status: Home / Self Care | Payer: Medicare HMO | Source: Ambulatory Visit | Attending: Orthopedic Surgery | Admitting: Orthopedic Surgery

## 2020-03-08 DIAGNOSIS — Z20822 Contact with and (suspected) exposure to covid-19: Secondary | ICD-10-CM | POA: Diagnosis not present

## 2020-03-08 DIAGNOSIS — Z01812 Encounter for preprocedural laboratory examination: Secondary | ICD-10-CM | POA: Diagnosis present

## 2020-03-08 LAB — SARS CORONAVIRUS 2 (TAT 6-24 HRS): SARS Coronavirus 2: NEGATIVE

## 2020-03-11 MED ORDER — BUPIVACAINE LIPOSOME 1.3 % IJ SUSP
20.0000 mL | INTRAMUSCULAR | Status: DC
  Filled 2020-03-11: qty 20

## 2020-03-12 ENCOUNTER — Inpatient Hospital Stay (HOSPITAL_COMMUNITY): Payer: Medicare HMO | Admitting: Physician Assistant

## 2020-03-12 ENCOUNTER — Other Ambulatory Visit: Payer: Self-pay

## 2020-03-12 ENCOUNTER — Inpatient Hospital Stay (HOSPITAL_COMMUNITY): Payer: Medicare HMO | Admitting: Anesthesiology

## 2020-03-12 ENCOUNTER — Inpatient Hospital Stay (HOSPITAL_COMMUNITY)
Admission: RE | Admit: 2020-03-12 | Discharge: 2020-03-15 | DRG: 470 | Disposition: A | Discharge status: Home / Self Care | Payer: Medicare HMO | Attending: Orthopedic Surgery | Admitting: Orthopedic Surgery

## 2020-03-12 ENCOUNTER — Encounter (HOSPITAL_COMMUNITY): Payer: Self-pay | Admitting: Orthopedic Surgery

## 2020-03-12 ENCOUNTER — Encounter (HOSPITAL_COMMUNITY)
Admission: RE | Disposition: A | Discharge status: Home / Self Care | Payer: Self-pay | Source: Home / Self Care | Attending: Orthopedic Surgery

## 2020-03-12 DIAGNOSIS — Z96652 Presence of left artificial knee joint: Secondary | ICD-10-CM | POA: Diagnosis present

## 2020-03-12 DIAGNOSIS — I10 Essential (primary) hypertension: Secondary | ICD-10-CM | POA: Diagnosis present

## 2020-03-12 DIAGNOSIS — Z87891 Personal history of nicotine dependence: Secondary | ICD-10-CM

## 2020-03-12 DIAGNOSIS — Z7901 Long term (current) use of anticoagulants: Secondary | ICD-10-CM | POA: Diagnosis not present

## 2020-03-12 DIAGNOSIS — M25761 Osteophyte, right knee: Secondary | ICD-10-CM | POA: Diagnosis present

## 2020-03-12 DIAGNOSIS — G8929 Other chronic pain: Secondary | ICD-10-CM | POA: Diagnosis present

## 2020-03-12 DIAGNOSIS — I4891 Unspecified atrial fibrillation: Secondary | ICD-10-CM | POA: Diagnosis present

## 2020-03-12 DIAGNOSIS — H409 Unspecified glaucoma: Secondary | ICD-10-CM | POA: Diagnosis present

## 2020-03-12 DIAGNOSIS — Z79899 Other long term (current) drug therapy: Secondary | ICD-10-CM

## 2020-03-12 DIAGNOSIS — M1711 Unilateral primary osteoarthritis, right knee: Secondary | ICD-10-CM | POA: Diagnosis present

## 2020-03-12 DIAGNOSIS — Z880 Allergy status to penicillin: Secondary | ICD-10-CM | POA: Diagnosis not present

## 2020-03-12 DIAGNOSIS — Z87892 Personal history of anaphylaxis: Secondary | ICD-10-CM | POA: Diagnosis not present

## 2020-03-12 HISTORY — PX: TOTAL KNEE ARTHROPLASTY: SHX125

## 2020-03-12 LAB — TYPE AND SCREEN
ABO/RH(D): O POS
Antibody Screen: NEGATIVE

## 2020-03-12 SURGERY — ARTHROPLASTY, KNEE, TOTAL
Anesthesia: Spinal | Site: Knee | Laterality: Right

## 2020-03-12 MED ORDER — LACTATED RINGERS IV SOLN: INTRAVENOUS | Status: DC

## 2020-03-12 MED ORDER — METHOCARBAMOL 500 MG IVPB - SIMPLE MED
500.0000 mg | Freq: Four times a day (QID) | INTRAVENOUS | Status: DC | PRN
  Filled 2020-03-12: qty 50

## 2020-03-12 MED ORDER — DILTIAZEM HCL ER COATED BEADS 120 MG PO CP24
120.0000 mg | ORAL_CAPSULE | Freq: Every day | ORAL | Status: DC
  Administered 2020-03-13 – 2020-03-15 (×3): 120 mg via ORAL
  Filled 2020-03-12 (×3): qty 1

## 2020-03-12 MED ORDER — DEXAMETHASONE SODIUM PHOSPHATE 10 MG/ML IJ SOLN
8.0000 mg | Freq: Once | INTRAMUSCULAR | Status: AC
  Administered 2020-03-12: 8 mg via INTRAVENOUS

## 2020-03-12 MED ORDER — GLYCOPYRROLATE 0.2 MG/ML IJ SOLN
INTRAMUSCULAR | Status: DC | PRN
  Administered 2020-03-12: .2 mg via INTRAVENOUS

## 2020-03-12 MED ORDER — ROPIVACAINE HCL 5 MG/ML IJ SOLN
INTRAMUSCULAR | Status: DC | PRN
  Administered 2020-03-12 (×2): 5 mL via PERINEURAL

## 2020-03-12 MED ORDER — VANCOMYCIN HCL IN DEXTROSE 1-5 GM/200ML-% IV SOLN
1000.0000 mg | INTRAVENOUS | Status: AC
  Administered 2020-03-12: 1000 mg via INTRAVENOUS
  Filled 2020-03-12: qty 200

## 2020-03-12 MED ORDER — BUPIVACAINE LIPOSOME 1.3 % IJ SUSP
INTRAMUSCULAR | Status: DC | PRN
  Administered 2020-03-12: 20 mL

## 2020-03-12 MED ORDER — MIDAZOLAM HCL 2 MG/2ML IJ SOLN
1.0000 mg | Freq: Once | INTRAMUSCULAR | Status: AC
  Administered 2020-03-12: 2 mg via INTRAVENOUS
  Filled 2020-03-12: qty 2

## 2020-03-12 MED ORDER — TRAMADOL HCL 50 MG PO TABS: 50.0000 mg | ORAL_TABLET | Freq: Four times a day (QID) | ORAL | Status: DC | PRN

## 2020-03-12 MED ORDER — CHLORHEXIDINE GLUCONATE 4 % EX LIQD: 60.0000 mL | Freq: Once | CUTANEOUS | Status: DC

## 2020-03-12 MED ORDER — FENTANYL CITRATE (PF) 100 MCG/2ML IJ SOLN
50.0000 ug | Freq: Once | INTRAMUSCULAR | Status: AC
  Administered 2020-03-12: 100 ug via INTRAVENOUS
  Filled 2020-03-12: qty 2

## 2020-03-12 MED ORDER — METOCLOPRAMIDE HCL 5 MG PO TABS: 5.0000 mg | ORAL_TABLET | Freq: Three times a day (TID) | ORAL | Status: DC | PRN

## 2020-03-12 MED ORDER — EPHEDRINE 5 MG/ML INJ
INTRAVENOUS | Status: AC
  Filled 2020-03-12: qty 10

## 2020-03-12 MED ORDER — METHOCARBAMOL 500 MG PO TABS
500.0000 mg | ORAL_TABLET | Freq: Four times a day (QID) | ORAL | Status: DC | PRN
  Administered 2020-03-13 – 2020-03-15 (×4): 500 mg via ORAL
  Filled 2020-03-12 (×4): qty 1

## 2020-03-12 MED ORDER — DIPHENHYDRAMINE HCL 12.5 MG/5ML PO ELIX: 12.5000 mg | ORAL_SOLUTION | ORAL | Status: DC | PRN

## 2020-03-12 MED ORDER — SODIUM CHLORIDE 0.9 % IR SOLN
Status: DC | PRN
  Administered 2020-03-12: 1000 mL

## 2020-03-12 MED ORDER — VANCOMYCIN HCL IN DEXTROSE 1-5 GM/200ML-% IV SOLN
1000.0000 mg | Freq: Two times a day (BID) | INTRAVENOUS | Status: AC
  Administered 2020-03-13: 1000 mg via INTRAVENOUS
  Filled 2020-03-12: qty 200

## 2020-03-12 MED ORDER — SODIUM CHLORIDE (PF) 0.9 % IJ SOLN
INTRAMUSCULAR | Status: AC
  Filled 2020-03-12: qty 50

## 2020-03-12 MED ORDER — LIDOCAINE 2% (20 MG/ML) 5 ML SYRINGE
INTRAMUSCULAR | Status: AC
  Filled 2020-03-12: qty 5

## 2020-03-12 MED ORDER — ONDANSETRON HCL 4 MG/2ML IJ SOLN
INTRAMUSCULAR | Status: DC | PRN
  Administered 2020-03-12: 4 mg via INTRAVENOUS

## 2020-03-12 MED ORDER — EPHEDRINE SULFATE 50 MG/ML IJ SOLN
INTRAMUSCULAR | Status: DC | PRN
  Administered 2020-03-12 (×2): 10 mg via INTRAVENOUS

## 2020-03-12 MED ORDER — DEXAMETHASONE SODIUM PHOSPHATE 10 MG/ML IJ SOLN
INTRAMUSCULAR | Status: AC
  Filled 2020-03-12: qty 1

## 2020-03-12 MED ORDER — MENTHOL 3 MG MT LOZG: 1.0000 | LOZENGE | OROMUCOSAL | Status: DC | PRN

## 2020-03-12 MED ORDER — GLYCOPYRROLATE PF 0.2 MG/ML IJ SOSY
PREFILLED_SYRINGE | INTRAMUSCULAR | Status: AC
  Filled 2020-03-12: qty 1

## 2020-03-12 MED ORDER — POLYETHYLENE GLYCOL 3350 17 G PO PACK: 17.0000 g | PACK | Freq: Every day | ORAL | Status: DC | PRN

## 2020-03-12 MED ORDER — ROPIVACAINE HCL 7.5 MG/ML IJ SOLN
INTRAMUSCULAR | Status: DC | PRN
  Administered 2020-03-12 (×4): 5 mL via PERINEURAL

## 2020-03-12 MED ORDER — SODIUM CHLORIDE (PF) 0.9 % IJ SOLN
INTRAMUSCULAR | Status: AC
  Filled 2020-03-12: qty 10

## 2020-03-12 MED ORDER — PHENOL 1.4 % MT LIQD: 1.0000 | OROMUCOSAL | Status: DC | PRN

## 2020-03-12 MED ORDER — APIXABAN 2.5 MG PO TABS
2.5000 mg | ORAL_TABLET | Freq: Two times a day (BID) | ORAL | Status: DC
  Administered 2020-03-13 – 2020-03-15 (×5): 2.5 mg via ORAL
  Filled 2020-03-12 (×5): qty 1

## 2020-03-12 MED ORDER — LIDOCAINE HCL (CARDIAC) PF 100 MG/5ML IV SOSY
PREFILLED_SYRINGE | INTRAVENOUS | Status: DC | PRN
  Administered 2020-03-12: 40 mg via INTRAVENOUS

## 2020-03-12 MED ORDER — TRANEXAMIC ACID-NACL 1000-0.7 MG/100ML-% IV SOLN
1000.0000 mg | INTRAVENOUS | Status: AC
  Administered 2020-03-12: 1000 mg via INTRAVENOUS
  Filled 2020-03-12: qty 100

## 2020-03-12 MED ORDER — DOCUSATE SODIUM 100 MG PO CAPS
100.0000 mg | ORAL_CAPSULE | Freq: Two times a day (BID) | ORAL | Status: DC
  Administered 2020-03-12 – 2020-03-15 (×6): 100 mg via ORAL
  Filled 2020-03-12 (×6): qty 1

## 2020-03-12 MED ORDER — BISACODYL 10 MG RE SUPP: 10.0000 mg | Freq: Every day | RECTAL | Status: DC | PRN

## 2020-03-12 MED ORDER — HYDROCODONE-ACETAMINOPHEN 7.5-325 MG PO TABS
1.0000 | ORAL_TABLET | Freq: Four times a day (QID) | ORAL | Status: DC | PRN
  Administered 2020-03-12 – 2020-03-13 (×3): 1 via ORAL
  Filled 2020-03-12 (×3): qty 1

## 2020-03-12 MED ORDER — POVIDONE-IODINE 10 % EX SWAB: 2.0000 "application " | Freq: Once | CUTANEOUS | Status: DC

## 2020-03-12 MED ORDER — ACETAMINOPHEN 500 MG PO TABS
1000.0000 mg | ORAL_TABLET | Freq: Four times a day (QID) | ORAL | Status: AC
  Administered 2020-03-12: 1000 mg via ORAL
  Filled 2020-03-12: qty 2

## 2020-03-12 MED ORDER — SODIUM CHLORIDE 0.9 % IV SOLN: INTRAVENOUS | Status: DC

## 2020-03-12 MED ORDER — FENTANYL CITRATE (PF) 100 MCG/2ML IJ SOLN
INTRAMUSCULAR | Status: AC
  Filled 2020-03-12: qty 2

## 2020-03-12 MED ORDER — FLEET ENEMA 7-19 GM/118ML RE ENEM: 1.0000 | ENEMA | Freq: Once | RECTAL | Status: DC | PRN

## 2020-03-12 MED ORDER — DEXAMETHASONE SODIUM PHOSPHATE 10 MG/ML IJ SOLN
10.0000 mg | Freq: Once | INTRAMUSCULAR | Status: AC
  Administered 2020-03-13: 10 mg via INTRAVENOUS
  Filled 2020-03-12: qty 1

## 2020-03-12 MED ORDER — PROPOFOL 500 MG/50ML IV EMUL
INTRAVENOUS | Status: AC
  Filled 2020-03-12: qty 50

## 2020-03-12 MED ORDER — METOCLOPRAMIDE HCL 5 MG/ML IJ SOLN: 5.0000 mg | Freq: Three times a day (TID) | INTRAMUSCULAR | Status: DC | PRN

## 2020-03-12 MED ORDER — ACETAMINOPHEN 10 MG/ML IV SOLN
1000.0000 mg | Freq: Once | INTRAVENOUS | Status: AC
  Administered 2020-03-12: 1000 mg via INTRAVENOUS
  Filled 2020-03-12: qty 100

## 2020-03-12 MED ORDER — MORPHINE SULFATE (PF) 2 MG/ML IV SOLN: 0.5000 mg | INTRAVENOUS | Status: DC | PRN

## 2020-03-12 MED ORDER — ONDANSETRON HCL 4 MG PO TABS: 4.0000 mg | ORAL_TABLET | Freq: Four times a day (QID) | ORAL | Status: DC | PRN

## 2020-03-12 MED ORDER — SODIUM CHLORIDE (PF) 0.9 % IJ SOLN
INTRAMUSCULAR | Status: DC | PRN
  Administered 2020-03-12: 60 mL

## 2020-03-12 MED ORDER — HYDROCODONE-ACETAMINOPHEN 5-325 MG PO TABS
1.0000 | ORAL_TABLET | ORAL | Status: DC | PRN
  Administered 2020-03-13: 1 via ORAL
  Administered 2020-03-14 – 2020-03-15 (×7): 2 via ORAL
  Filled 2020-03-12 (×8): qty 2

## 2020-03-12 MED ORDER — ONDANSETRON HCL 4 MG/2ML IJ SOLN: 4.0000 mg | Freq: Four times a day (QID) | INTRAMUSCULAR | Status: DC | PRN

## 2020-03-12 MED ORDER — CLONIDINE HCL (ANALGESIA) 100 MCG/ML EP SOLN
EPIDURAL | Status: DC | PRN
  Administered 2020-03-12: 100 ug

## 2020-03-12 MED ORDER — PROPOFOL 500 MG/50ML IV EMUL
INTRAVENOUS | Status: DC | PRN
  Administered 2020-03-12: 25 ug/kg/min via INTRAVENOUS

## 2020-03-12 MED ORDER — ONDANSETRON HCL 4 MG/2ML IJ SOLN
INTRAMUSCULAR | Status: AC
  Filled 2020-03-12: qty 2

## 2020-03-12 SURGICAL SUPPLY — 58 items
ATTUNE MED DOME PAT 41 KNEE (Knees) ×2 IMPLANT
ATTUNE MED DOME PAT 41MM KNEE (Knees) ×1 IMPLANT
ATTUNE PS FEM RT SZ 7 CEM KNEE (Femur) ×3 IMPLANT
ATTUNE PSRP INSR SZ7 12 KNEE (Insert) ×2 IMPLANT
ATTUNE PSRP INSR SZ7 12MM KNEE (Insert) ×1 IMPLANT
BAG ZIPLOCK 12X15 (MISCELLANEOUS) ×3 IMPLANT
BASE TIBIAL ROT PLAT SZ 7 KNEE (Knees) ×1 IMPLANT
BLADE SAG 18X100X1.27 (BLADE) ×3 IMPLANT
BLADE SAW SGTL 11.0X1.19X90.0M (BLADE) ×3 IMPLANT
BLADE SURG SZ10 CARB STEEL (BLADE) ×6 IMPLANT
BNDG ELASTIC 6X5.8 VLCR STR LF (GAUZE/BANDAGES/DRESSINGS) ×3 IMPLANT
BOWL SMART MIX CTS (DISPOSABLE) ×3 IMPLANT
CEMENT HV SMART SET (Cement) ×6 IMPLANT
CLOSURE WOUND 1/2 X4 (GAUZE/BANDAGES/DRESSINGS) ×1
COVER SURGICAL LIGHT HANDLE (MISCELLANEOUS) ×3 IMPLANT
COVER WAND RF STERILE (DRAPES) IMPLANT
CUFF TOURN SGL QUICK 34 (TOURNIQUET CUFF) ×3
CUFF TRNQT CYL 34X4.125X (TOURNIQUET CUFF) ×1 IMPLANT
DECANTER SPIKE VIAL GLASS SM (MISCELLANEOUS) ×3 IMPLANT
DRAPE U-SHAPE 47X51 STRL (DRAPES) ×3 IMPLANT
DRSG AQUACEL AG ADV 3.5X10 (GAUZE/BANDAGES/DRESSINGS) ×3 IMPLANT
DURAPREP 26ML APPLICATOR (WOUND CARE) ×3 IMPLANT
ELECT REM PT RETURN 15FT ADLT (MISCELLANEOUS) ×3 IMPLANT
EVACUATOR 1/8 PVC DRAIN (DRAIN) IMPLANT
GAUZE SPONGE 2X2 8PLY STRL LF (GAUZE/BANDAGES/DRESSINGS) ×1 IMPLANT
GLOVE BIO SURGEON STRL SZ7 (GLOVE) ×3 IMPLANT
GLOVE BIO SURGEON STRL SZ8 (GLOVE) ×3 IMPLANT
GLOVE BIOGEL PI IND STRL 7.0 (GLOVE) ×1 IMPLANT
GLOVE BIOGEL PI IND STRL 8 (GLOVE) ×1 IMPLANT
GLOVE BIOGEL PI INDICATOR 7.0 (GLOVE) ×2
GLOVE BIOGEL PI INDICATOR 8 (GLOVE) ×2
GOWN STRL REUS W/TWL LRG LVL3 (GOWN DISPOSABLE) ×6 IMPLANT
HANDPIECE INTERPULSE COAX TIP (DISPOSABLE) ×3
HOLDER FOLEY CATH W/STRAP (MISCELLANEOUS) IMPLANT
IMMOBILIZER KNEE 20 (SOFTGOODS) ×3
IMMOBILIZER KNEE 20 THIGH 36 (SOFTGOODS) ×1 IMPLANT
KIT TURNOVER KIT A (KITS) IMPLANT
MANIFOLD NEPTUNE II (INSTRUMENTS) ×3 IMPLANT
NS IRRIG 1000ML POUR BTL (IV SOLUTION) ×3 IMPLANT
PACK TOTAL KNEE CUSTOM (KITS) ×3 IMPLANT
PADDING CAST COTTON 6X4 STRL (CAST SUPPLIES) ×6 IMPLANT
PENCIL SMOKE EVACUATOR (MISCELLANEOUS) IMPLANT
PIN DRILL FIX HALF THREAD (BIT) ×3 IMPLANT
PIN STEINMAN FIXATION KNEE (PIN) ×3 IMPLANT
PROTECTOR NERVE ULNAR (MISCELLANEOUS) ×3 IMPLANT
SET HNDPC FAN SPRY TIP SCT (DISPOSABLE) ×1 IMPLANT
SPONGE GAUZE 2X2 STER 10/PKG (GAUZE/BANDAGES/DRESSINGS) ×2
STRIP CLOSURE SKIN 1/2X4 (GAUZE/BANDAGES/DRESSINGS) ×2 IMPLANT
SUT MNCRL AB 4-0 PS2 18 (SUTURE) ×3 IMPLANT
SUT STRATAFIX 0 PDS 27 VIOLET (SUTURE) ×3
SUT VIC AB 2-0 CT1 27 (SUTURE) ×9
SUT VIC AB 2-0 CT1 TAPERPNT 27 (SUTURE) ×3 IMPLANT
SUTURE STRATFX 0 PDS 27 VIOLET (SUTURE) ×1 IMPLANT
TIBIAL BASE ROT PLAT SZ 7 KNEE (Knees) ×3 IMPLANT
TRAY FOLEY MTR SLVR 16FR STAT (SET/KITS/TRAYS/PACK) ×3 IMPLANT
WATER STERILE IRR 1000ML POUR (IV SOLUTION) ×6 IMPLANT
WRAP KNEE MAXI GEL POST OP (GAUZE/BANDAGES/DRESSINGS) ×3 IMPLANT
YANKAUER SUCT BULB TIP 10FT TU (MISCELLANEOUS) ×3 IMPLANT

## 2020-03-12 NOTE — Progress Notes (Signed)
Assisted Dr. Hatchett with right, ultrasound guided, adductor canal block. Side rails up, monitors on throughout procedure. See vital signs in flow sheet. Tolerated Procedure well.  

## 2020-03-12 NOTE — Anesthesia Procedure Notes (Signed)
Anesthesia Regional Block: Adductor canal block   Pre-Anesthetic Checklist: ,, timeout performed, Correct Patient, Correct Site, Correct Laterality, Correct Procedure, Correct Position, site marked, Risks and benefits discussed,  Surgical consent,  Pre-op evaluation,  At surgeon's request and post-op pain management  Laterality: Lower and Right  Prep: chloraprep       Needles:  Injection technique: Single-shot  Needle Type: Echogenic Stimulator Needle     Needle Length: 10cm  Needle Gauge: 21   Needle insertion depth: 1 cm   Additional Needles:   Procedures:,,,, ultrasound used (permanent image in chart),,,,  Narrative:  Start time: 03/12/2020 12:50 PM End time: 03/12/2020 12:57 PM Injection made incrementally with aspirations every 5 mL.  Performed by: Personally  Anesthesiologist: Lyn Hollingshead, MD

## 2020-03-12 NOTE — Anesthesia Preprocedure Evaluation (Addendum)
Anesthesia Evaluation  Patient identified by MRN, date of birth, ID band Patient awake    Reviewed: Allergy & Precautions, NPO status , Patient's Chart, lab work & pertinent test results  Airway Mallampati: I       Dental no notable dental hx.    Pulmonary former smoker,    Pulmonary exam normal        Cardiovascular hypertension, Pt. on medications  Rhythm:Regular Rate:Normal     Neuro/Psych negative neurological ROS  negative psych ROS   GI/Hepatic negative GI ROS, Neg liver ROS,   Endo/Other  negative endocrine ROS  Renal/GU negative Renal ROS  negative genitourinary   Musculoskeletal   Abdominal Normal abdominal exam  (+)   Peds  Hematology   Anesthesia Other Findings   Reproductive/Obstetrics                                                             Anesthesia Evaluation  Patient identified by MRN, date of birth, ID band Patient awake    Reviewed: Allergy & Precautions, H&P , NPO status , Patient's Chart, lab work & pertinent test results  Airway Mallampati: II   Neck ROM: full    Dental   Pulmonary former smoker,    breath sounds clear to auscultation       Cardiovascular hypertension, + dysrhythmias Atrial Fibrillation  Rhythm:regular Rate:Normal     Neuro/Psych    GI/Hepatic   Endo/Other    Renal/GU      Musculoskeletal  (+) Arthritis ,   Abdominal   Peds  Hematology   Anesthesia Other Findings   Reproductive/Obstetrics                             Anesthesia Physical Anesthesia Plan  ASA: III  Anesthesia Plan: General   Post-op Pain Management:  Regional for Post-op pain   Induction: Intravenous  PONV Risk Score and Plan: 1 and Ondansetron, Dexamethasone and Treatment may vary due to age or medical condition  Airway Management Planned: Simple Face Mask  Additional Equipment:   Intra-op Plan:    Post-operative Plan:   Informed Consent: I have reviewed the patients History and Physical, chart, labs and discussed the procedure including the risks, benefits and alternatives for the proposed anesthesia with the patient or authorized representative who has indicated his/her understanding and acceptance.       Plan Discussed with: CRNA, Anesthesiologist and Surgeon  Anesthesia Plan Comments: (Pt took last dose of eliquis 9/18 at 8pm (<72 hrs). )       Anesthesia Quick Evaluation                                   Anesthesia Evaluation  Patient identified by MRN, date of birth, ID band Patient awake    Reviewed: Allergy & Precautions, H&P , NPO status , Patient's Chart, lab work & pertinent test results  Airway Mallampati: II   Neck ROM: full    Dental   Pulmonary former smoker,    breath sounds clear to auscultation       Cardiovascular hypertension, + dysrhythmias Atrial Fibrillation  Rhythm:regular Rate:Normal     Neuro/Psych    GI/Hepatic  Endo/Other    Renal/GU      Musculoskeletal  (+) Arthritis ,   Abdominal   Peds  Hematology   Anesthesia Other Findings   Reproductive/Obstetrics                             Anesthesia Physical Anesthesia Plan  ASA: III  Anesthesia Plan: General   Post-op Pain Management:  Regional for Post-op pain   Induction: Intravenous  PONV Risk Score and Plan: 1 and Ondansetron, Dexamethasone and Treatment may vary due to age or medical condition  Airway Management Planned: Simple Face Mask  Additional Equipment:   Intra-op Plan:   Post-operative Plan:   Informed Consent: I have reviewed the patients History and Physical, chart, labs and discussed the procedure including the risks, benefits and alternatives for the proposed anesthesia with the patient or authorized representative who has indicated his/her understanding and acceptance.       Plan Discussed with:  CRNA, Anesthesiologist and Surgeon  Anesthesia Plan Comments: (Pt took last dose of eliquis 9/18 at 8pm (<72 hrs). )       Anesthesia Quick Evaluation  Anesthesia Physical Anesthesia Plan  ASA: II  Anesthesia Plan: Spinal   Post-op Pain Management:  Regional for Post-op pain   Induction:   PONV Risk Score and Plan: Ondansetron, Dexamethasone and Midazolam  Airway Management Planned: Natural Airway, Nasal Cannula and Simple Face Mask  Additional Equipment: None  Intra-op Plan:   Post-operative Plan:   Informed Consent: I have reviewed the patients History and Physical, chart, labs and discussed the procedure including the risks, benefits and alternatives for the proposed anesthesia with the patient or authorized representative who has indicated his/her understanding and acceptance.       Plan Discussed with: CRNA  Anesthesia Plan Comments:         Anesthesia Quick Evaluation

## 2020-03-12 NOTE — Discharge Instructions (Signed)
 Frank Aluisio, MD Total [facility] Specialist [facility] [address] [place], [place] [address] [phone]  TOTAL KNEE REPLACEMENT POSTOPERATIVE DIRECTIONS    Knee Rehabilitation, Guidelines Following Surgery  Results after knee surgery are often greatly improved when you follow the exercise, range of motion and muscle strengthening exercises prescribed by your doctor. Safety measures are also important to protect the knee from further injury. If any of these exercises cause you to have increased pain or swelling in your knee joint, decrease the amount until you are comfortable again and slowly increase them. If you have problems or questions, call your caregiver or physical therapist for advice.   HOME CARE INSTRUCTIONS  . Remove items at home which could result in a fall. This includes throw rugs or furniture in walking pathways.  . ICE to the affected knee as much as tolerated. Icing helps control swelling. If the swelling is well controlled you will be more comfortable and rehab easier. Continue to use ice on the knee for pain and swelling from surgery. You may notice swelling that will progress down to the foot and ankle. This is normal after surgery. Elevate the leg when you are not up walking on it.    . Continue to use the breathing machine which will help keep your temperature down. It is common for your temperature to cycle up and down following surgery, especially at night when you are not up moving around and exerting yourself. The breathing machine keeps your lungs expanded and your temperature down. . Do not place pillow under the operative knee, focus on keeping the knee straight while resting  DIET You may resume your previous home diet once you are discharged from the hospital.  DRESSING / WOUND CARE / SHOWERING . Keep your bulky bandage on for 2 days. On the third post-operative day you may remove the Ace bandage and gauze. There is a  waterproof adhesive bandage on your skin which will stay in place until your first follow-up appointment. Once you remove this you will not need to place another bandage . You may begin showering 3 days following surgery, but do not submerge the incision under water.  ACTIVITY For the first 5 days, the key is rest and control of pain and swelling . Do your home exercises twice a day starting on post-operative day 3. On the days you go to physical therapy, just do the home exercises once that day. . You should rest, ice and elevate the leg for 50 minutes out of every hour. Get up and walk/stretch for 10 minutes per hour. After 5 days you can increase your activity slowly as tolerated. . Walk with your walker as instructed. Use the walker until you are comfortable transitioning to a cane. Walk with the cane in the opposite hand of the operative leg. You may discontinue the cane once you are comfortable and walking steadily. . Avoid periods of inactivity such as sitting longer than an hour when not asleep. This helps prevent blood clots.  . You may discontinue the knee immobilizer once you are able to perform a straight leg raise while lying down. . You may resume a sexual relationship in one month or when given the OK by your doctor.  . You may return to work once you are cleared by your doctor.  . Do not drive a car for 6 weeks or until released by your surgeon.  . Do not drive while taking narcotics.  TED HOSE   STOCKINGS Wear the elastic stockings on both legs for three weeks following surgery during the day. You may remove them at night for sleeping.  WEIGHT BEARING Weight bearing as tolerated with assist device (walker, cane, etc) as directed, use it as long as suggested by your surgeon or therapist, typically at least 4-6 weeks.  POSTOPERATIVE CONSTIPATION PROTOCOL Constipation - defined medically as fewer than three stools per week and severe constipation as less than one stool per  week.  One of the most common issues patients have following surgery is constipation.  Even if you have a regular bowel pattern at home, your normal regimen is likely to be disrupted due to multiple reasons following surgery.  Combination of anesthesia, postoperative narcotics, change in appetite and fluid intake all can affect your bowels.  In order to avoid complications following surgery, here are some recommendations in order to help you during your recovery period.  . Colace (docusate) - Pick up an over-the-counter form of Colace or another stool softener and take twice a day as long as you are requiring postoperative pain medications.  Take with a full glass of water daily.  If you experience loose stools or diarrhea, hold the colace until you stool forms back up. If your symptoms do not get better within 1 week or if they get worse, check with your doctor. . Dulcolax (bisacodyl) - Pick up over-the-counter and take as directed by the product packaging as needed to assist with the movement of your bowels.  Take with a full glass of water.  Use this product as needed if not relieved by Colace only.  . MiraLax (polyethylene glycol) - Pick up over-the-counter to have on hand. MiraLax is a solution that will increase the amount of water in your bowels to assist with bowel movements.  Take as directed and can mix with a glass of water, juice, soda, coffee, or tea. Take if you go more than two days without a movement. Do not use MiraLax more than once per day. Call your doctor if you are still constipated or irregular after using this medication for 7 days in a row.  If you continue to have problems with postoperative constipation, please contact the office for further assistance and recommendations.  If you experience "the worst abdominal pain ever" or develop nausea or vomiting, please contact the office immediatly for further recommendations for treatment.  ITCHING If you experience itching with your  medications, try taking only a single pain pill, or even half a pain pill at a time.  You can also use Benadryl over the counter for itching or also to help with sleep.   MEDICATIONS See your medication summary on the "After Visit Summary" that the nursing staff will review with you prior to discharge.  You may have some home medications which will be placed on hold until you complete the course of blood thinner medication.  It is important for you to complete the blood thinner medication as prescribed by your surgeon.  Continue your approved medications as instructed at time of discharge.  PRECAUTIONS . If you experience chest pain or shortness of breath - call 911 immediately for transfer to the hospital emergency department.  . If you develop a fever greater that 101 F, purulent drainage from wound, increased redness or drainage from wound, foul odor from the wound/dressing, or calf pain - CONTACT YOUR SURGEON.                                                     FOLLOW-UP APPOINTMENTS Make sure you keep all of your appointments after your operation with your surgeon and caregivers. You should call the office at the above phone number and make an appointment for approximately two weeks after the date of your surgery or on the date instructed by your surgeon outlined in the "After Visit Summary".  RANGE OF MOTION AND STRENGTHENING EXERCISES  Rehabilitation of the knee is important following a knee injury or an operation. After just a few days of immobilization, the muscles of the thigh which control the knee become weakened and shrink (atrophy). Knee exercises are designed to build up the tone and strength of the thigh muscles and to improve knee motion. Often times heat used for twenty to thirty minutes before working out will loosen up your tissues and help with improving the range of motion but do not use heat for the first two weeks following surgery. These exercises can be done on a training  (exercise) mat, on the floor, on a table or on a bed. Use what ever works the best and is most comfortable for you Knee exercises include:  . Leg Lifts - While your knee is still immobilized in a splint or cast, you can do straight leg raises. Lift the leg to 60 degrees, hold for 3 sec, and slowly lower the leg. Repeat 10-20 times 2-3 times daily. Perform this exercise against resistance later as your knee gets better.  Javier Docker and Hamstring Sets - Tighten up the muscle on the front of the thigh (Quad) and hold for 5-10 sec. Repeat this 10-20 times hourly. Hamstring sets are done by pushing the foot backward against an object and holding for 5-10 sec. Repeat as with quad sets.   Leg Slides: Lying on your back, slowly slide your foot toward your buttocks, bending your knee up off the floor (only go as far as is comfortable). Then slowly slide your foot back down until your leg is flat on the floor again.  Angel Wings: Lying on your back spread your legs to the side as far apart as you can without causing discomfort.  A rehabilitation program following serious knee injuries can speed recovery and prevent re-injury in the future due to weakened muscles. Contact your doctor or a physical therapist for more information on knee rehabilitation.   IF YOU ARE TRANSFERRED TO A SKILLED REHAB FACILITY If the patient is transferred to a skilled rehab facility following release from the hospital, a list of the current medications will be sent to the facility for the patient to continue.  When discharged from the skilled rehab facility, please have the facility set up the patient's [place] prior to being released. Also, the skilled facility will be responsible for providing the patient with their medications at time of release from the facility to include their pain medication, the muscle relaxants, and their blood thinner medication. If the patient is still at the rehab facility at time of the two  week follow up appointment, the skilled rehab facility will also need to assist the patient in arranging follow up appointment in our office and any transportation needs.  MAKE SURE YOU:  . Understand these instructions.  . Get help right away if you are not doing well or get worse.    Pick up stool softner and laxative for home use following surgery while on pain medications. Do not submerge incision under water. Please use good hand washing techniques while changing dressing each day. May shower  starting three days after surgery. Please use a clean towel to pat the incision dry following showers. Continue to use ice for pain and swelling after surgery. Do not use any lotions or creams on the incision until instructed by your surgeon.

## 2020-03-12 NOTE — Anesthesia Procedure Notes (Signed)
Spinal  Patient location during procedure: OR Start time: 03/12/2020 1:15 PM End time: 03/12/2020 1:18 PM Staffing Performed: anesthesiologist  Anesthesiologist: Lyn Hollingshead, MD Preanesthetic Checklist Completed: patient identified, IV checked, site marked, risks and benefits discussed, surgical consent, monitors and equipment checked, pre-op evaluation and timeout performed Spinal Block Patient position: sitting Prep: DuraPrep and site prepped and draped Patient monitoring: continuous pulse ox and blood pressure Approach: midline Location: L3-4 Injection technique: single-shot Needle Needle type: Pencan  Needle gauge: 24 G Needle length: 10 cm Needle insertion depth: 7 cm Assessment Sensory level: T8

## 2020-03-12 NOTE — Op Note (Signed)
OPERATIVE REPORT-TOTAL KNEE ARTHROPLASTY   Pre-operative diagnosis- Osteoarthritis  Right knee(s)  Post-operative diagnosis- Osteoarthritis Right knee(s)  Procedure-  Right  Total Knee Arthroplasty  Surgeon- Dione Plover. Ayesha Markwell, MD  Assistant- Griffith Citron, PA-C   Anesthesia-  Adductor canal block and spinal  EBL-10 mL   Drains Hemovac  Tourniquet time-  Total Tourniquet Time Documented: Calf (Right) - 37 minutes Total: Calf (Right) - 37 minutes     Complications- None  Condition-PACU - hemodynamically stable.   Brief Clinical Note  Mathew Simpson is a 84 y.o. year old male with end stage OA of his right knee with progressively worsening pain and dysfunction. He has constant pain, with activity and at rest and significant functional deficits with difficulties even with ADLs. He has had extensive non-op management including analgesics, injections of cortisone and viscosupplements, and home exercise program, but remains in significant pain with significant dysfunction. Radiographs show bone on bone arthritis medial and patellofemoral. He presents now for right Total Knee Arthroplasty.    Procedure in detail---   The patient is brought into the operating room and positioned supine on the operating table. After successful administration of  Adductor canal block and spinal,   a tourniquet is placed high on the  Right thigh(s) and the lower extremity is prepped and draped in the usual sterile fashion. Time out is performed by the operating team and then the  Right lower extremity is wrapped in Esmarch, knee flexed and the tourniquet inflated to 300 mmHg.       A midline incision is made with a ten blade through the subcutaneous tissue to the level of the extensor mechanism. A fresh blade is used to make a medial parapatellar arthrotomy. Soft tissue over the proximal medial tibia is subperiosteally elevated to the joint line with a knife and into the semimembranosus bursa with a Cobb  elevator. Soft tissue over the proximal lateral tibia is elevated with attention being paid to avoiding the patellar tendon on the tibial tubercle. The patella is everted, knee flexed 90 degrees and the ACL and PCL are removed. Findings are bone on bone medial and patellofemoral with large global osteophytes      The drill is used to create a starting hole in the distal femur and the canal is thoroughly irrigated with sterile saline to remove the fatty contents. The 5 degree Right  valgus alignment guide is placed into the femoral canal and the distal femoral cutting block is pinned to remove 9 mm off the distal femur. Resection is made with an oscillating saw.      The tibia is subluxed forward and the menisci are removed. The extramedullary alignment guide is placed referencing proximally at the medial aspect of the tibial tubercle and distally along the second metatarsal axis and tibial crest. The block is pinned to remove 62mm off the more deficient medial  side. Resection is made with an oscillating saw. Size 7is the most appropriate size for the tibia and the proximal tibia is prepared with the modular drill and keel punch for that size.      The femoral sizing guide is placed and size 7 is most appropriate. Rotation is marked off the epicondylar axis and confirmed by creating a rectangular flexion gap at 90 degrees. The size 7 cutting block is pinned in this rotation and the anterior, posterior and chamfer cuts are made with the oscillating saw. The intercondylar block is then placed and that cut is made.  Trial size 7 tibial component, trial size 7 posterior stabilized femur and a 12  mm posterior stabilized rotating platform insert trial is placed. Full extension is achieved with excellent varus/valgus and anterior/posterior balance throughout full range of motion. The patella is everted and thickness measured to be 27  mm. Free hand resection is taken to 15 mm, a 41 template is placed, lug holes are  drilled, trial patella is placed, and it tracks normally. Osteophytes are removed off the posterior femur with the trial in place. All trials are removed and the cut bone surfaces prepared with pulsatile lavage. Cement is mixed and once ready for implantation, the size 7 tibial implant, size  7 posterior stabilized femoral component, and the size 41 patella are cemented in place and the patella is held with the clamp. The trial insert is placed and the knee held in full extension. The Exparel (20 ml mixed with 60 ml saline) is injected into the extensor mechanism, posterior capsule, medial and lateral gutters and subcutaneous tissues.  All extruded cement is removed and once the cement is hard the permanent 12 mm posterior stabilized rotating platform insert is placed into the tibial tray.      The wound is copiously irrigated with saline solution and the extensor mechanism closed over a hemovac drain with #1 V-loc suture. The tourniquet is released for a total tourniquet time of 37  minutes. Flexion against gravity is 140 degrees and the patella tracks normally. Subcutaneous tissue is closed with 2.0 vicryl and subcuticular with running 4.0 Monocryl. The incision is cleaned and dried and steri-strips and a bulky sterile dressing are applied. The limb is placed into a knee immobilizer and the patient is awakened and transported to recovery in stable condition.      Please note that a surgical assistant was a medical necessity for this procedure in order to perform it in a safe and expeditious manner. Surgical assistant was necessary to retract the ligaments and vital neurovascular structures to prevent injury to them and also necessary for proper positioning of the limb to allow for anatomic placement of the prosthesis.   Dione Plover Jenna Routzahn, MD    03/12/2020, 2:27 PM

## 2020-03-12 NOTE — Plan of Care (Signed)
  Problem: Education: Goal: Knowledge of the prescribed therapeutic regimen will improve Outcome: Progressing   Problem: Pain Management: Goal: Pain level will decrease with appropriate interventions Outcome: Progressing   

## 2020-03-12 NOTE — Plan of Care (Signed)
Plan of care for post-op day 0 discussed with patient.   Will continue to monitor.    SWhittemore, RN    

## 2020-03-12 NOTE — Interval H&P Note (Signed)
History and Physical Interval Note:  03/12/2020 10:37 AM  Mathew Simpson  has presented today for surgery, with the diagnosis of right knee osteoarthritis.  The various methods of treatment have been discussed with the patient and family. After consideration of risks, benefits and other options for treatment, the patient has consented to  Procedure(s) with comments: TOTAL KNEE ARTHROPLASTY (Right) - 9min as a surgical intervention.  The patient's history has been reviewed, patient examined, no change in status, stable for surgery.  I have reviewed the patient's chart and labs.  Questions were answered to the patient's satisfaction.     Pilar Plate Kano Heckmann

## 2020-03-12 NOTE — Evaluation (Signed)
Physical Therapy Evaluation Patient Details Name: Mathew Simpson MRN: [id number] DOB: 09-01-1936 Today's Date: 03/12/2020   History of Present Illness  Patient is 84 y.o. male s/p Rt TKA on 03/12/20 with PMH significant for HTN, glaucoma, OA, Lt TKA on 09/12/19.  Clinical Impression  Mathew Simpson is a 84 y.o. male POD 0 s/p Rt TKA. Patient reports independence with mobility at baseline. Patient is now limited by functional impairments (see PT problem list below) and requires min assist for transfers and gait with RW. Patient was able to ambulate ~40 feet with RW and min assist. Patient instructed in exercise to facilitate ROM and circulation. Patient will benefit from continued skilled PT interventions to address impairments and progress towards PLOF. Acute PT will follow to progress mobility and stair training in preparation for safe discharge home.     Follow Up Recommendations Follow surgeon's recommendation for DC plan and follow-up therapies    Equipment Recommendations  None recommended by PT    Recommendations for Other Services       Precautions / Restrictions Precautions Precautions: Fall Restrictions Weight Bearing Restrictions: No      Mobility  Bed Mobility Overal bed mobility: Needs Assistance Bed Mobility: Supine to Sit     Supine to sit: Min guard;HOB elevated     General bed mobility comments: cues for use of bed rail, pt requried extra time to complete.  Transfers Overall transfer level: Needs assistance Equipment used: Rolling walker (2 wheeled) Transfers: Sit to/from Stand Sit to Stand: Min assist;From elevated surface         General transfer comment: cues for hand placement and technique with RW, assit for power up and to steady with initial rise.  Ambulation/Gait Ambulation/Gait assistance: Min assist Gait Distance (Feet): 40 Feet Assistive device: Rolling walker (2 wheeled) Gait Pattern/deviations: Step-to pattern;Decreased step length -  right;Decreased stance time - right;Decreased stride length;Decreased weight shift to right Gait velocity: decreased   General Gait Details: cues for safe step sequencing and safe proximity to RW throughout. assist required to steady and manage safe walker position.  Stairs            Wheelchair Mobility    Modified Rankin (Stroke Patients Only)       Balance Overall balance assessment: Needs assistance Sitting-balance support: Feet supported Sitting balance-Leahy Scale: Good     Standing balance support: During functional activity;Bilateral upper extremity supported Standing balance-Leahy Scale: Poor            Pertinent Vitals/Pain Pain Assessment: 0-10 Pain Score: 1  Pain Location: Rt knee Pain Descriptors / Indicators: Aching Pain Intervention(s): Limited activity within patient's tolerance;Monitored during session;Repositioned    Home Living Family/patient expects to be discharged to:: Private residence Living Arrangements: Alone Available Help at Discharge: Family(grandson and duaghter) Type of Home: House Home Access: Stairs to enter;Ramped entrance(ramp at front)   Technical brewer of Steps: 4 stairs at his home with bil hand rails; 4 steps at daughters home with 1 hand rail   Home Equipment: Ubly - 2 wheels;Cane - single point;Bedside commode;Shower seat - built in;Shower seat;Grab bars - tub/shower      Prior Function Level of Independence: Independent         Comments: pt independent with no device since recovering fro m Lt TKA     Hand Dominance   Dominant Hand: Right    Extremity/Trunk Assessment   Upper Extremity Assessment Upper Extremity Assessment: Overall WFL for tasks assessed    Lower Extremity  Assessment Lower Extremity Assessment: RLE deficits/detail RLE Deficits / Details: good quad activation, no extensor lag with SLR RLE Sensation: WNL RLE Coordination: WNL    Cervical / Trunk Assessment Cervical / Trunk  Assessment: Normal  Communication   Communication: No difficulties  Cognition Arousal/Alertness: Awake/alert Behavior During Therapy: WFL for tasks assessed/performed Overall Cognitive Status: Within Functional Limits for tasks assessed           General Comments      Exercises Total Joint Exercises Ankle Circles/Pumps: Both;AROM;15 reps;Seated Quad Sets: AROM;Right;5 reps;Seated   Assessment/Plan    PT Assessment Patient needs continued PT services  PT Problem List Decreased range of motion;Decreased activity tolerance;Decreased balance;Decreased mobility;Decreased strength;Decreased knowledge of use of DME       PT Treatment Interventions DME instruction;Gait training;Stair training;Functional mobility training;Therapeutic activities;Therapeutic exercise;Balance training;Patient/family education    PT Goals (Current goals can be found in the Care Plan section)  Acute Rehab PT Goals Patient Stated Goal: for Rt knee to heal smoothly PT Goal Formulation: With patient Time For Goal Achievement: 03/19/20 Potential to Achieve Goals: Good    Frequency 7X/week    AM-PAC PT "6 Clicks" Mobility  Outcome Measure Help needed turning from your back to your side while in a flat bed without using bedrails?: A Little Help needed moving from lying on your back to sitting on the side of a flat bed without using bedrails?: A Little Help needed moving to and from a bed to a chair (including a wheelchair)?: A Little Help needed standing up from a chair using your arms (e.g., wheelchair or bedside chair)?: A Little Help needed to walk in hospital room?: A Little Help needed climbing 3-5 steps with a railing? : A Little 6 Click Score: 18    End of Session Equipment Utilized During Treatment: Gait belt Activity Tolerance: Patient tolerated treatment well Patient left: in chair;with call bell/phone within reach;with chair alarm set;with family/visitor present Nurse Communication: Mobility  status PT Visit Diagnosis: Muscle weakness (generalized) (M62.81);Difficulty in walking, not elsewhere classified (R26.2)    Time: [id number] PT Time Calculation (min) (ACUTE ONLY): 26 min   Charges:   PT Evaluation $PT Eval Low Complexity: 1 Low PT Treatments $Gait Training: 8-22 mins       Verner Mould, DPT Physical Therapist with [facility] [phone]  03/12/2020 7:07 PM

## 2020-03-12 NOTE — Anesthesia Postprocedure Evaluation (Signed)
Anesthesia Post Note  Patient: Mathew Simpson  Procedure(s) Performed: TOTAL KNEE ARTHROPLASTY (Right Knee)     Patient location during evaluation: PACU Anesthesia Type: Spinal Level of consciousness: awake Pain management: pain level controlled Vital Signs Assessment: post-procedure vital signs reviewed and stable Respiratory status: spontaneous breathing Cardiovascular status: stable Postop Assessment: no headache, no backache, spinal receding, patient able to bend at knees and no apparent nausea or vomiting Anesthetic complications: no    Last Vitals:  Vitals:   03/12/20 1515 03/12/20 1530  BP: 126/90 128/86  Pulse: (!) 51 66  Resp: 15 19  Temp:    SpO2: 96% 95%    Last Pain:  Vitals:   03/12/20 1530  TempSrc:   PainSc: 0-No pain   Pain Goal:    LLE Motor Response: Purposeful movement (03/12/20 1530)   RLE Motor Response: Purposeful movement (03/12/20 1530)   L Sensory Level: L4-Anterior knee, lower leg (03/12/20 1530) R Sensory Level: L4-Anterior knee, lower leg (03/12/20 1530) Epidural/Spinal Function Patient able to flex knees: Yes (03/12/20 1530)  Huston Foley

## 2020-03-12 NOTE — Transfer of Care (Signed)
Immediate Anesthesia Transfer of Care Note  Patient: Mathew Simpson  Procedure(s) Performed: TOTAL KNEE ARTHROPLASTY (Right Knee)  Patient Location: PACU  Anesthesia Type:Spinal  Level of Consciousness: awake, alert , oriented and patient cooperative  Airway & Oxygen Therapy: Patient Spontanous Breathing and Patient connected to face mask oxygen  Post-op Assessment: Report given to RN and Post -op Vital signs reviewed and stable  Post vital signs: Reviewed and stable  Last Vitals:  Vitals Value Taken Time  BP 117/89 03/12/20 1456  Temp    Pulse 81 03/12/20 1457  Resp 19 03/12/20 1457  SpO2 100 % 03/12/20 1457  Vitals shown include unvalidated device data.  Last Pain:  Vitals:   03/12/20 1055  TempSrc: Oral  PainSc:          Complications: No apparent anesthesia complications

## 2020-03-13 LAB — BASIC METABOLIC PANEL
Anion gap: 9 (ref 5–15)
BUN: 20 mg/dL (ref 8–23)
CO2: 22 mmol/L (ref 22–32)
Calcium: 8.7 mg/dL — ABNORMAL LOW (ref 8.9–10.3)
Chloride: 106 mmol/L (ref 98–111)
Creatinine, Ser: 0.92 mg/dL (ref 0.61–1.24)
GFR calc Af Amer: >60 mL/min (ref >60)
GFR calc non Af Amer: >60 mL/min (ref >60)
Glucose, Bld: 240 mg/dL — ABNORMAL HIGH (ref 70–99)
Potassium: 4.3 mmol/L (ref 3.5–5.1)
Sodium: 137 mmol/L (ref 135–145)

## 2020-03-13 LAB — CBC
HCT: 36.7 % — ABNORMAL LOW (ref 39.0–52.0)
Hemoglobin: 12 g/dL — ABNORMAL LOW (ref 13.0–17.0)
MCH: 29.2 pg (ref 26.0–34.0)
MCHC: 32.7 g/dL (ref 30.0–36.0)
MCV: 89.3 fL (ref 80.0–100.0)
Platelets: 180 10*3/uL (ref 150–400)
RBC: 4.11 MIL/uL — ABNORMAL LOW (ref 4.22–5.81)
RDW: 14.2 % (ref 11.5–15.5)
WBC: 8.6 10*3/uL (ref 4.0–10.5)
nRBC: 0 % (ref 0.0–0.2)

## 2020-03-13 NOTE — Progress Notes (Signed)
   Subjective: 1 Day Post-Op Procedure(s) (LRB): TOTAL KNEE ARTHROPLASTY (Right) Patient reports pain as mild.   Patient seen in rounds by Dr. Wynelle Link. Patient is well, and has had no acute complaints or problems other than pain in the right knee. Denies chest pain, SOB, or calf pain. Foley catheter to be removed this AM. We will continue therapy today, ambulated 40' yesterday afternoon.   Objective: Vital signs in last 24 hours: Temp:  [97.5 F (36.4 C)-98.2 F (36.8 C)] 97.8 F (36.6 C) (03/23 0240) Pulse Rate:  [51-93] 81 (03/23 0240) Resp:  [11-23] 16 (03/23 0240) BP: (99-153)/(76-100) 140/98 (03/23 0240) SpO2:  [94 %-100 %] 100 % (03/23 0240) Weight:  [84.8 kg] 84.8 kg (03/22 1022)  Intake/Output from previous day:  Intake/Output Summary (Last 24 hours) at 03/13/2020 0737 Last data filed at 03/13/2020 0602 Gross per 24 hour  Intake 3224.47 ml  Output 1620 ml  Net 1604.47 ml     Intake/Output this shift: No intake/output data recorded.  Labs: Recent Labs    03/13/20 0310  HGB 12.0*   Recent Labs    03/13/20 0310  WBC 8.6  RBC 4.11*  HCT 36.7*  PLT 180   Recent Labs    03/13/20 0310  NA 137  K 4.3  CL 106  CO2 22  BUN 20  CREATININE 0.92  GLUCOSE 240*  CALCIUM 8.7*   No results for input(s): LABPT, INR in the last 72 hours.  Exam: General - Patient is Alert and Oriented Extremity - Neurologically intact Neurovascular intact Sensation intact distally Dorsiflexion/Plantar flexion intact Dressing - dressing C/D/I Motor Function - intact, moving foot and toes well on exam.   Past Medical History:  Diagnosis Date  . Arthritis    back,  . DJD (degenerative joint disease)   . Dysrhythmia    A-fib  . Glaucoma   . Hypertension     Assessment/Plan: 1 Day Post-Op Procedure(s) (LRB): TOTAL KNEE ARTHROPLASTY (Right) Active Problems:   Primary osteoarthritis of right knee  Estimated body mass index is 28.43 kg/m as calculated from the  following:   Height as of this encounter: 5\' 8"  (1.727 m).   Weight as of this encounter: 84.8 kg. Advance diet Up with therapy D/C IV fluids  Anticipated LOS equal to or greater than 2 midnights due to - Age 33 and older with one or more of the following:  - Obesity  - Expected need for hospital services (PT, OT, Nursing) required for safe  discharge  - Anticipated need for postoperative skilled nursing care or inpatient rehab  - Active co-morbidities: Cardiac Arrhythmia OR   - Unanticipated findings during/Post Surgery: None  - Patient is a high risk of re-admission due to: None    DVT Prophylaxis - Eliquis Weight bearing as tolerated. D/C O2 and pulse ox and try on room air. Hemovac pulled without difficulty, will continue therapy today.  Plan is to go Home after hospital stay. 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  Plan for discharge tomorrow if meeting goals with therapy and stable.   Theresa Duty, PA-C Orthopedic Surgery [phone] 03/13/2020, 7:37 AM

## 2020-03-13 NOTE — Progress Notes (Signed)
Physical Therapy Treatment Patient Details Name: Mathew Simpson MRN: [id number] DOB: 01/13/1936 Today's Date: 03/13/2020    History of Present Illness Patient is 84 y.o. male s/p Rt TKA on 03/12/20 with PMH significant for HTN, glaucoma, OA, Lt TKA on 09/12/19.    PT Comments    POD # 1 pm session Assisted with amb in hallway a second time.  General Gait Details: 25% VC's on proper sequencing as well as upright posture and safety with  Turns.  Assisted to bathroom. General transfer comment: 25% VC's on proper hand placement and safety with turns as well as LE extention prior to sit.  Also assisted with toilet transfer.  Assisted back to bed per pt request.   General bed mobility comments: demonstarted and instructed how to use a belt to self assist LE back onto bed.  Applied ICE.  Pt plans to D/C to home tomorrow.    Follow Up Recommendations  Follow surgeon's recommendation for DC plan and follow-up therapies     Equipment Recommendations  None recommended by PT    Recommendations for Other Services       Precautions / Restrictions Precautions Precautions: Fall Precaution Comments: instructed no pillow under knee Restrictions Weight Bearing Restrictions: No Other Position/Activity Restrictions: WBAT    Mobility  Bed Mobility Overal bed mobility: Needs Assistance Bed Mobility: Sit to Supine     Supine to sit: Min guard;HOB elevated     General bed mobility comments: demonstarted and instructed how to use a belt to self assist LE back onto bed  Transfers Overall transfer level: Needs assistance Equipment used: Rolling walker (2 wheeled) Transfers: Sit to/from Stand Sit to Stand: Min guard;Min assist         General transfer comment: 25% VC's on proper hand placement and safety with turns as well as LE extention prior to sit.  Also assisted with toilet transfer.  Ambulation/Gait Ambulation/Gait assistance: Min guard;Min assist Gait Distance (Feet): 57 Feet Assistive  device: Rolling walker (2 wheeled) Gait Pattern/deviations: Step-to pattern;Decreased step length - right;Decreased stance time - right;Decreased stride length;Decreased weight shift to right Gait velocity: decreased   General Gait Details: 25% VC's on proper sequencing as well as upright posture and safety with  turns.   Stairs             Wheelchair Mobility    Modified Rankin (Stroke Patients Only)       Balance                                            Cognition Arousal/Alertness: Awake/alert Behavior During Therapy: WFL for tasks assessed/performed Overall Cognitive Status: Within Functional Limits for tasks assessed                                 General Comments: AxO x 4 pleasant      Exercises      General Comments        Pertinent Vitals/Pain Pain Assessment: 0-10 Pain Score: 5  Pain Location: Rt knee Pain Descriptors / Indicators: Aching;Discomfort;Tightness;Tender;Operative site guarding Pain Intervention(s): Monitored during session;Premedicated before session;Repositioned;Ice applied    Home Living                      Prior Function  PT Goals (current goals can now be found in the care plan section) Progress towards PT goals: Progressing toward goals    Frequency    7X/week      PT Plan Current plan remains appropriate    Co-evaluation              AM-PAC PT "6 Clicks" Mobility   Outcome Measure  Help needed turning from your back to your side while in a flat bed without using bedrails?: A Little Help needed moving from lying on your back to sitting on the side of a flat bed without using bedrails?: A Little Help needed moving to and from a bed to a chair (including a wheelchair)?: A Little Help needed standing up from a chair using your arms (e.g., wheelchair or bedside chair)?: A Little Help needed to walk in hospital room?: A Little Help needed climbing 3-5 steps  with a railing? : A Little 6 Click Score: 18    End of Session   Activity Tolerance: Patient tolerated treatment well Patient left: in bed Nurse Communication: Mobility status PT Visit Diagnosis: Muscle weakness (generalized) (M62.81);Difficulty in walking, not elsewhere classified (R26.2)     Time: 1350-1420 PT Time Calculation (min) (ACUTE ONLY): 30 min  Charges:  $Gait Training: 8-22 mins $Therapeutic Activity: 8-22 mins                     Rica Koyanagi  PTA Acute  Rehabilitation Services Pager      [phone] Office      [phone]

## 2020-03-13 NOTE — Progress Notes (Signed)
Physical Therapy Treatment Patient Details Name: Mathew Simpson MRN: [id number] DOB: Sep 21, 1936 Today's Date: 03/13/2020    History of Present Illness Patient is 84 y.o. male s/p Rt TKA on 03/12/20 with PMH significant for HTN, glaucoma, OA, Lt TKA on 09/12/19.    PT Comments    POD # 1 am session Assisted OOB.  General bed mobility comments: demonstarted and instructed how to use a belt to self assist LE off bed.  General transfer comment: 25% VC's on proper hand placement and safety with turns as well as LE extention prior to sit.  General Gait Details: 25% VC's on proper sequencing as well as upright posture and safety with  Turns. Performed some TE's following HEP handout followed by ICE. Will see pt again this afternoon.    Follow Up Recommendations  Follow surgeon's recommendation for DC plan and follow-up therapies     Equipment Recommendations  None recommended by PT    Recommendations for Other Services       Precautions / Restrictions Precautions Precautions: Fall Precaution Comments: instructed no pillow under knee Restrictions Weight Bearing Restrictions: No Other Position/Activity Restrictions: WBAT    Mobility  Bed Mobility Overal bed mobility: Needs Assistance Bed Mobility: Supine to Sit     Supine to sit: Min guard;HOB elevated     General bed mobility comments: demonstarted and instructed how to use a belt to self assist LE off bed  Transfers Overall transfer level: Needs assistance Equipment used: Rolling walker (2 wheeled) Transfers: Sit to/from Stand Sit to Stand: Min guard;Min assist         General transfer comment: 25% VC's on proper hand placement and safety with turns as well as LE extention prior to sit  Ambulation/Gait Ambulation/Gait assistance: Min guard;Min assist Gait Distance (Feet): 44 Feet Assistive device: Rolling walker (2 wheeled) Gait Pattern/deviations: Step-to pattern;Decreased step length - right;Decreased stance time -  right;Decreased stride length;Decreased weight shift to right Gait velocity: decreased   General Gait Details: 25% VC's on proper sequencing as well as upright posture and safety with  turns.   Stairs             Wheelchair Mobility    Modified Rankin (Stroke Patients Only)       Balance                                            Cognition Arousal/Alertness: Awake/alert Behavior During Therapy: WFL for tasks assessed/performed Overall Cognitive Status: Within Functional Limits for tasks assessed                                 General Comments: AxO x 4 pleasant      Exercises      General Comments        Pertinent Vitals/Pain Pain Assessment: 0-10 Pain Score: 5  Pain Location: Rt knee Pain Descriptors / Indicators: Aching;Discomfort;Tightness;Tender;Operative site guarding Pain Intervention(s): Monitored during session;Premedicated before session;Repositioned;Ice applied    Home Living                      Prior Function            PT Goals (current goals can now be found in the care plan section) Progress towards PT goals: Progressing toward goals    Frequency  7X/week      PT Plan Current plan remains appropriate    Co-evaluation              AM-PAC PT "6 Clicks" Mobility   Outcome Measure  Help needed turning from your back to your side while in a flat bed without using bedrails?: A Little Help needed moving from lying on your back to sitting on the side of a flat bed without using bedrails?: A Little Help needed moving to and from a bed to a chair (including a wheelchair)?: A Little Help needed standing up from a chair using your arms (e.g., wheelchair or bedside chair)?: A Little Help needed to walk in hospital room?: A Little Help needed climbing 3-5 steps with a railing? : A Little 6 Click Score: 18    End of Session   Activity Tolerance: Patient tolerated treatment well Patient  left: in chair;with call bell/phone within reach;with chair alarm set;with family/visitor present Nurse Communication: Mobility status PT Visit Diagnosis: Muscle weakness (generalized) (M62.81);Difficulty in walking, not elsewhere classified (R26.2)     Time: [id number] PT Time Calculation (min) (ACUTE ONLY): 25 min  Charges:  $Gait Training: 8-22 mins $Therapeutic Exercise: 8-22 mins                     Rica Koyanagi  PTA Acute  Rehabilitation Services Pager      [phone] Office      [phone]

## 2020-03-14 ENCOUNTER — Encounter: Payer: Self-pay | Admitting: *Deleted

## 2020-03-14 LAB — CBC
HCT: 35.7 % — ABNORMAL LOW (ref 39.0–52.0)
Hemoglobin: 11.5 g/dL — ABNORMAL LOW (ref 13.0–17.0)
MCH: 29.1 pg (ref 26.0–34.0)
MCHC: 32.2 g/dL (ref 30.0–36.0)
MCV: 90.4 fL (ref 80.0–100.0)
Platelets: 180 10*3/uL (ref 150–400)
RBC: 3.95 MIL/uL — ABNORMAL LOW (ref 4.22–5.81)
RDW: 14.5 % (ref 11.5–15.5)
WBC: 12.3 10*3/uL — ABNORMAL HIGH (ref 4.0–10.5)
nRBC: 0 % (ref 0.0–0.2)

## 2020-03-14 LAB — BASIC METABOLIC PANEL
Anion gap: 6 (ref 5–15)
BUN: 20 mg/dL (ref 8–23)
CO2: 24 mmol/L (ref 22–32)
Calcium: 8.6 mg/dL — ABNORMAL LOW (ref 8.9–10.3)
Chloride: 107 mmol/L (ref 98–111)
Creatinine, Ser: 0.73 mg/dL (ref 0.61–1.24)
GFR calc Af Amer: >60 mL/min (ref >60)
GFR calc non Af Amer: >60 mL/min (ref >60)
Glucose, Bld: 162 mg/dL — ABNORMAL HIGH (ref 70–99)
Potassium: 4.2 mmol/L (ref 3.5–5.1)
Sodium: 137 mmol/L (ref 135–145)

## 2020-03-14 MED ORDER — LISINOPRIL 10 MG PO TABS
10.0000 mg | ORAL_TABLET | Freq: Every day | ORAL | Status: DC
  Administered 2020-03-14 – 2020-03-15 (×3): 10 mg via ORAL
  Filled 2020-03-14 (×3): qty 1

## 2020-03-14 MED ORDER — LISINOPRIL 10 MG PO TABS: 10.0000 mg | ORAL_TABLET | Freq: Every day | ORAL | Status: DC

## 2020-03-14 NOTE — Progress Notes (Signed)
   Subjective: 2 Days Post-Op Procedure(s) (LRB): TOTAL KNEE ARTHROPLASTY (Right) Patient reports pain as mild.   Patient seen in rounds for Dr. Wynelle Link. Patient is well, and has had no acute complaints or problems other than discomfort in the right knee. No acute events overnight. Voiding without difficulty, positive BM. Denies CP, SHOB, N/V, abdominal pain. He states he does not feel comfortable going home today.  Plan is to go Home after hospital stay.  Objective: Vital signs in last 24 hours: Temp:  [98 F (36.7 C)-98.5 F (36.9 C)] 98.1 F (36.7 C) (03/24 0735) Pulse Rate:  [58-96] 58 (03/24 0735) Resp:  [16-18] 16 (03/24 0735) BP: (146-161)/(85-113) 155/113 (03/24 0735) SpO2:  [95 %-98 %] 98 % (03/24 0735)  Intake/Output from previous day:  Intake/Output Summary (Last 24 hours) at 03/14/2020 0740 Last data filed at 03/14/2020 0600 Gross per 24 hour  Intake 720 ml  Output 1530 ml  Net -810 ml    Intake/Output this shift: No intake/output data recorded.  Labs: Recent Labs    03/13/20 0310 03/14/20 0332  HGB 12.0* 11.5*   Recent Labs    03/13/20 0310 03/14/20 0332  WBC 8.6 12.3*  RBC 4.11* 3.95*  HCT 36.7* 35.7*  PLT 180 180   Recent Labs    03/13/20 0310 03/14/20 0332  NA 137 137  K 4.3 4.2  CL 106 107  CO2 22 24  BUN 20 20  CREATININE 0.92 0.73  GLUCOSE 240* 162*  CALCIUM 8.7* 8.6*   No results for input(s): LABPT, INR in the last 72 hours.  Exam: General - Patient is Alert and Oriented x3 Extremity - Neurologically intact Sensation intact distally Intact pulses distally Dorsiflexion/Plantar flexion intact Dressing/Incision - clean, no drainage Motor Function - intact, moving foot and toes well on exam.   Past Medical History:  Diagnosis Date  . Arthritis    back,  . DJD (degenerative joint disease)   . Dysrhythmia    A-fib  . Glaucoma   . Hypertension     Assessment/Plan: 2 Days Post-Op Procedure(s) (LRB): TOTAL KNEE  ARTHROPLASTY (Right) Active Problems:   Primary osteoarthritis of right knee  Estimated body mass index is 28.43 kg/m as calculated from the following:   Height as of this encounter: 5\' 8"  (1.727 m).   Weight as of this encounter: 84.8 kg. Advance diet Up with therapy  DVT Prophylaxis - Eliquis Weight-bearing as tolerated  Plan for discharge home after hospital stay. Hypertensive this morning, we will re-order his home Lisinopril today and continue to monitor. We will keep him overnight due to complications following recent left total knee arthroplasty, when patient was readmitted due to narcotic related encephalopathy. Continue working with therapy towards safe discharge home, likely tomorrow.    Griffith Citron, PA-C Orthopedic Surgery [phone] 03/14/2020, 7:40 AM

## 2020-03-14 NOTE — Progress Notes (Signed)
Physical Therapy Treatment Patient Details Name: Mathew Simpson MRN: [id number] DOB: 08-02-1936 Today's Date: 03/14/2020    History of Present Illness Patient is 84 y.o. male s/p Rt TKA on 03/12/20 with PMH significant for HTN, glaucoma, OA, Lt TKA on 09/12/19.    PT Comments    POD   #2 am session Assisted OOB.  Assisted with amb in hallway.  General Gait Details: 25% VC's on proper sequencing as well as upright posture and safety with  Turns. Assisted to bathroom.  General transfer comment: 25% VC's on proper hand placement and safety with turns as well as LE extention prior to sit.  Also assisted with toilet transfer. Assisted to recliner to perform some TE's followed by ICE.  Follow Up Recommendations  Follow surgeon's recommendation for DC plan and follow-up therapies     Equipment Recommendations  None recommended by PT    Recommendations for Other Services       Precautions / Restrictions Precautions Precautions: Fall Precaution Comments: instructed no pillow under knee Restrictions Weight Bearing Restrictions: No Other Position/Activity Restrictions: WBAT    Mobility  Bed Mobility Overal bed mobility: Needs Assistance Bed Mobility: Supine to Sit     Supine to sit: Min guard;HOB elevated     General bed mobility comments: demonstarted and instructed how to use a belt to self assist LE back onto bed  Transfers Overall transfer level: Needs assistance Equipment used: Rolling walker (2 wheeled) Transfers: Sit to/from [facility] Sit to Stand: Min guard;Supervision Stand pivot transfers: Min guard;Min assist       General transfer comment: 25% VC's on proper hand placement and safety with turns as well as LE extention prior to sit.  Also assisted with toilet transfer.  Ambulation/Gait Ambulation/Gait assistance: Min guard;Min assist Gait Distance (Feet): 58 Feet Assistive device: Rolling walker (2 wheeled) Gait Pattern/deviations: Step-to  pattern;Decreased step length - right;Decreased stance time - right;Decreased stride length;Decreased weight shift to right     General Gait Details: 25% VC's on proper sequencing as well as upright posture and safety with  turns.   Stairs             Wheelchair Mobility    Modified Rankin (Stroke Patients Only)       Balance                                            Cognition Arousal/Alertness: Awake/alert Behavior During Therapy: WFL for tasks assessed/performed Overall Cognitive Status: Within Functional Limits for tasks assessed                                 General Comments: AxO x 4 pleasant/funny      Exercises   Total Knee Replacement TE's following HEP handout 10 reps B LE ankle pumps 05 reps towel squeezes 05 reps knee presses 05 reps heel slides  05 reps SAQ's 05 reps SLR's 05 reps ABD Educated on use of gait belt to assist with TE's Followed by ICE     General Comments        Pertinent Vitals/Pain Pain Assessment: 0-10 Pain Score: 7  Pain Location: Rt knee Pain Descriptors / Indicators: Aching;Discomfort;Tightness;Tender;Operative site guarding Pain Intervention(s): Monitored during session;Premedicated before session;Repositioned;Ice applied    Home Living  Prior Function            PT Goals (current goals can now be found in the care plan section) Progress towards PT goals: Progressing toward goals    Frequency    7X/week      PT Plan Current plan remains appropriate    Co-evaluation              AM-PAC PT "6 Clicks" Mobility   Outcome Measure  Help needed turning from your back to your side while in a flat bed without using bedrails?: A Little Help needed moving from lying on your back to sitting on the side of a flat bed without using bedrails?: A Little Help needed moving to and from a bed to a chair (including a wheelchair)?: A Little Help needed  standing up from a chair using your arms (e.g., wheelchair or bedside chair)?: A Little Help needed to walk in hospital room?: A Little Help needed climbing 3-5 steps with a railing? : A Little 6 Click Score: 18    End of Session Equipment Utilized During Treatment: Gait belt Activity Tolerance: Patient tolerated treatment well Patient left: in chair;with call bell/phone within reach Nurse Communication: Mobility status       Time: [id number] PT Time Calculation (min) (ACUTE ONLY): 33 min  Charges:  $Gait Training: 8-22 mins $Therapeutic Exercise: 8-22 mins                     Rica Koyanagi  PTA Acute  Rehabilitation Services Pager      [phone] Office      [phone]

## 2020-03-14 NOTE — Plan of Care (Signed)
  Problem: Activity: Goal: Ability to avoid complications of mobility impairment will improve Outcome: Progressing   Problem: Pain Management: Goal: Pain level will decrease with appropriate interventions Outcome: Progressing   

## 2020-03-14 NOTE — Progress Notes (Signed)
Physical Therapy Treatment Patient Details Name: Mathew Simpson MRN: [id number] DOB: [birth year] Today's Date: 03/14/2020    History of Present Illness Patient is 84 y.o. male s/p Rt TKA on 03/12/20 with PMH significant for HTN, glaucoma, OA, Lt TKA on 09/12/19.    PT Comments    POD # 2 pm session RN called stated pt was wanting to get back into bed.  Assisted out of recliner to amb a decreased disytance due to increased pain level.  Very slow gait.  General Gait Details: 25% VC's on proper sequencing as well as upright posture and safety with  Turns. Assisted back to bed. General bed mobility comments: demonstarted and instructed how to use a belt to self assist LE back onto bed.  Pt stated he lives alone and the he "really doesn't have much help" lined up.  Eval indicated a daughter and grandson were available but pt stated "not really":  Pt will need initial assistance at home.    Follow Up Recommendations  Follow surgeon's recommendation for DC plan and follow-up therapies     Equipment Recommendations  None recommended by PT    Recommendations for Other Services       Precautions / Restrictions Precautions Precautions: Fall Precaution Comments: instructed no pillow under knee Restrictions Weight Bearing Restrictions: No Other Position/Activity Restrictions: WBAT    Mobility  Bed Mobility Overal bed mobility: Needs Assistance Bed Mobility: Sit to Supine     Supine to sit: Min guard;HOB elevated Sit to supine: Min assist   General bed mobility comments: demonstarted and instructed how to use a belt to self assist LE back onto bed  Transfers Overall transfer level: Needs assistance Equipment used: Rolling walker (2 wheeled) Transfers: Sit to/from [facility] Sit to Stand: Min guard;Supervision Stand pivot transfers: Min guard;Min assist       General transfer comment: 25% VC's on proper hand placement and safety with turns as well as LE extention prior  to sit.  Also assisted with toilet transfer.  Ambulation/Gait Ambulation/Gait assistance: Min guard;Min assist Gait Distance (Feet): 46 Feet Assistive device: Rolling walker (2 wheeled) Gait Pattern/deviations: Step-to pattern;Decreased step length - right;Decreased stance time - right;Decreased stride length;Decreased weight shift to right     General Gait Details: 25% VC's on proper sequencing as well as upright posture and safety with  turns.   Stairs             Wheelchair Mobility    Modified Rankin (Stroke Patients Only)       Balance                                            Cognition Arousal/Alertness: Awake/alert Behavior During Therapy: WFL for tasks assessed/performed Overall Cognitive Status: Within Functional Limits for tasks assessed                                 General Comments: AxO x 4 pleasant/funny      Exercises      General Comments        Pertinent Vitals/Pain Pain Assessment: 0-10 Pain Score: 7  Pain Location: Rt knee Pain Descriptors / Indicators: Aching;Discomfort;Tightness;Tender;Operative site guarding Pain Intervention(s): Monitored during session;Premedicated before session;Repositioned;Ice applied    Home Living  Prior Function            PT Goals (current goals can now be found in the care plan section) Progress towards PT goals: Progressing toward goals    Frequency    7X/week      PT Plan Current plan remains appropriate    Co-evaluation              AM-PAC PT "6 Clicks" Mobility   Outcome Measure  Help needed turning from your back to your side while in a flat bed without using bedrails?: A Little Help needed moving from lying on your back to sitting on the side of a flat bed without using bedrails?: A Little Help needed moving to and from a bed to a chair (including a wheelchair)?: A Little Help needed standing up from a chair using  your arms (e.g., wheelchair or bedside chair)?: A Little Help needed to walk in hospital room?: A Little Help needed climbing 3-5 steps with a railing? : A Little 6 Click Score: 18    End of Session Equipment Utilized During Treatment: Gait belt Activity Tolerance: Patient tolerated treatment well Patient left: in bed Nurse Communication: Mobility status       Time: 1420-1445 PT Time Calculation (min) (ACUTE ONLY): 25 min  Charges:  $Gait Training: 8-22 mins $Therapeutic Activity: 8-22 mins                     Rica Koyanagi  PTA Acute  Rehabilitation Services Pager      [phone] Office      [phone]

## 2020-03-15 LAB — CBC
HCT: 33.5 % — ABNORMAL LOW (ref 39.0–52.0)
Hemoglobin: 11 g/dL — ABNORMAL LOW (ref 13.0–17.0)
MCH: 30.1 pg (ref 26.0–34.0)
MCHC: 32.8 g/dL (ref 30.0–36.0)
MCV: 91.8 fL (ref 80.0–100.0)
Platelets: 151 10*3/uL (ref 150–400)
RBC: 3.65 MIL/uL — ABNORMAL LOW (ref 4.22–5.81)
RDW: 14.8 % (ref 11.5–15.5)
WBC: 9.3 10*3/uL (ref 4.0–10.5)
nRBC: 0 % (ref 0.0–0.2)

## 2020-03-15 MED ORDER — METHOCARBAMOL 500 MG PO TABS: 500.0000 mg | ORAL_TABLET | Freq: Four times a day (QID) | ORAL | 0 refills | Status: DC | PRN

## 2020-03-15 MED ORDER — HYDROCODONE-ACETAMINOPHEN 7.5-325 MG PO TABS: 1.0000 | ORAL_TABLET | Freq: Four times a day (QID) | ORAL | 0 refills | Status: DC | PRN

## 2020-03-15 NOTE — Progress Notes (Addendum)
   Subjective: 3 Days Post-Op Procedure(s) (LRB): TOTAL KNEE ARTHROPLASTY (Right) Patient reports pain as mild.   Patient seen in rounds with Dr. Wynelle Link. Patient is well, and has had no acute complaints or problems other than discomfort in the right knee. No acute events overnight. Ambulated well with PT yesterday. Voiding without difficulty, positive BM yesterday. Denies CP, SHOB, N/V.  Plan is to go Home after hospital stay.  Objective: Vital signs in last 24 hours: Temp:  [98.1 F (36.7 C)-98.5 F (36.9 C)] 98.2 F (36.8 C) (03/25 0546) Pulse Rate:  [55-90] 76 (03/25 0546) Resp:  [16-19] 19 (03/25 0546) BP: (137-155)/(84-113) 150/95 (03/25 0546) SpO2:  [97 %-98 %] 98 % (03/25 0546)  Intake/Output from previous day:  Intake/Output Summary (Last 24 hours) at 03/15/2020 0725 Last data filed at 03/15/2020 0600 Gross per 24 hour  Intake 960 ml  Output 1600 ml  Net -640 ml    Intake/Output this shift: No intake/output data recorded.  Labs: Recent Labs    03/13/20 0310 03/14/20 0332 03/15/20 0306  HGB 12.0* 11.5* 11.0*   Recent Labs    03/14/20 0332 03/15/20 0306  WBC 12.3* 9.3  RBC 3.95* 3.65*  HCT 35.7* 33.5*  PLT 180 151   Recent Labs    03/13/20 0310 03/14/20 0332  NA 137 137  K 4.3 4.2  CL 106 107  CO2 22 24  BUN 20 20  CREATININE 0.92 0.73  GLUCOSE 240* 162*  CALCIUM 8.7* 8.6*   No results for input(s): LABPT, INR in the last 72 hours.  Exam: General - Patient is Alert and Oriented Extremity - Neurologically intact Sensation intact distally Intact pulses distally Dorsiflexion/Plantar flexion intact Dressing/Incision - clean, dry, no drainage Motor Function - intact, moving foot and toes well on exam.   Past Medical History:  Diagnosis Date  . Arthritis    back,  . DJD (degenerative joint disease)   . Dysrhythmia    A-fib  . Glaucoma   . Hypertension     Assessment/Plan: 3 Days Post-Op Procedure(s) (LRB): TOTAL KNEE ARTHROPLASTY  (Right) Active Problems:   Primary osteoarthritis of right knee  Estimated body mass index is 28.43 kg/m as calculated from the following:   Height as of this encounter: 5\' 8"  (1.727 m).   Weight as of this encounter: 84.8 kg. Advance diet Up with therapy D/C IV fluids  DVT Prophylaxis - Eliquis Weight-bearing as tolerated  Plan for likely discharge home today after 1-2 sessions of therapy as long as he continues to do well and is meeting his goals with therapy. We have ordered HHPT for assistance for the next few weeks. He will then transition to [place]. He will leave the aquacel dressing in place until follow up. PDMP shows last refill of Norco 7.5 #120 on 03/02/20. He will not need additional pain medication at discharge. Follow up with Dr. Wynelle Link in 2 weeks.   Griffith Citron, PA-C Orthopedic Surgery [phone] 03/15/2020, 7:25 AM

## 2020-03-15 NOTE — Progress Notes (Signed)
Physical Therapy Treatment Patient Details Name: Mathew Simpson MRN: [id number] DOB: 1936/11/01 Today's Date: 03/15/2020    History of Present Illness Patient is 84 y.o. male s/p Rt TKA on 03/12/20 with PMH significant for HTN, glaucoma, OA, Lt TKA on 09/12/19.    PT Comments    POD # 3 am session. Assisted OOB to amb in hallway.  Assisted to bathroom.  Assisted to recliner and performed some TKR TE's followed by ICE. Pt will need another PT session prior to D/C    Follow Up Recommendations  Follow surgeon's recommendation for DC plan and follow-up therapies;Home health PT     Equipment Recommendations  None recommended by PT    Recommendations for Other Services       Precautions / Restrictions Precautions Precautions: Fall Precaution Comments: instructed no pillow under knee Restrictions Weight Bearing Restrictions: No Other Position/Activity Restrictions: WBAT    Mobility  Bed Mobility Overal bed mobility: Needs Assistance Bed Mobility: Supine to Sit           General bed mobility comments: demonstarted and instructed how to use a belt to self assist LE off bed  Transfers Overall transfer level: Needs assistance Equipment used: Rolling walker (2 wheeled) Transfers: Sit to/from [facility] Sit to Stand: Supervision Stand pivot transfers: Min guard;Min assist       General transfer comment: 25% VC's on proper hand placement and safety with turns as well as LE extention prior to sit.  Also assisted with toilet transfer.  Ambulation/Gait Ambulation/Gait assistance: Supervision;Min guard Gait Distance (Feet): 55 Feet Assistive device: Rolling walker (2 wheeled) Gait Pattern/deviations: Step-to pattern;Decreased step length - right;Decreased stance time - right;Decreased stride length;Decreased weight shift to right Gait velocity: decreased   General Gait Details: 25% VC's on proper sequencing as well as upright posture and safety with  turns.   Tolerated an increased distance.   Stairs             Wheelchair Mobility    Modified Rankin (Stroke Patients Only)       Balance                                            Cognition Arousal/Alertness: Awake/alert Behavior During Therapy: WFL for tasks assessed/performed Overall Cognitive Status: Within Functional Limits for tasks assessed                                 General Comments: AxO x 4 pleasant/funny      Exercises   Total Knee Replacement TE's following HEP handout 10 reps B LE ankle pumps 05 reps towel squeezes 05 reps knee presses 05 reps heel slides  05 reps SAQ's 05 reps SLR's 05 reps ABD Educated on use of gait belt to assist with TE's Followed by ICE     General Comments        Pertinent Vitals/Pain Pain Assessment: 0-10 Pain Score: 5  Pain Location: Rt knee Pain Descriptors / Indicators: Aching;Discomfort;Tightness;Tender;Operative site guarding Pain Intervention(s): Monitored during session;Repositioned;Ice applied;Premedicated before session    Home Living                      Prior Function            PT Goals (current goals can now be found in  the care plan section) Progress towards PT goals: Progressing toward goals    Frequency    7X/week      PT Plan Current plan remains appropriate    Co-evaluation              AM-PAC PT "6 Clicks" Mobility   Outcome Measure  Help needed turning from your back to your side while in a flat bed without using bedrails?: A Little Help needed moving from lying on your back to sitting on the side of a flat bed without using bedrails?: A Little Help needed moving to and from a bed to a chair (including a wheelchair)?: A Little Help needed standing up from a chair using your arms (e.g., wheelchair or bedside chair)?: A Little Help needed to walk in hospital room?: A Little Help needed climbing 3-5 steps with a railing? : A Little 6  Click Score: 18    End of Session Equipment Utilized During Treatment: Gait belt Activity Tolerance: Patient tolerated treatment well Patient left: in chair;with call bell/phone within reach;with chair alarm set Nurse Communication: Mobility status PT Visit Diagnosis: Muscle weakness (generalized) (M62.81);Difficulty in walking, not elsewhere classified (R26.2)     Time: [id number] PT Time Calculation (min) (ACUTE ONLY): 38 min  Charges:  $Gait Training: 8-22 mins $Therapeutic Exercise: 8-22 mins $Therapeutic Activity: 8-22 mins                     Rica Koyanagi  PTA Acute  Rehabilitation Services Pager      [phone] Office      [phone]

## 2020-03-15 NOTE — Progress Notes (Signed)
Physical Therapy Treatment Patient Details Name: Mathew Simpson MRN: [id number] DOB: September 15, 1936 Today's Date: 03/15/2020    History of Present Illness Patient is 84 y.o. male s/p Rt TKA on 03/12/20 with PMH significant for HTN, glaucoma, OA, Lt TKA on 09/12/19.    PT Comments    POD #3 pm session Assisted out or recliner chair to amb a greater distance in hallway.  Assisted back to bed to nap before D/C to home today.  Daughter works till 3 pm.    Follow Up Recommendations  Follow surgeon's recommendation for DC plan and follow-up therapies;Home health PT     Equipment Recommendations  None recommended by PT    Recommendations for Other Services       Precautions / Restrictions Precautions Precautions: Fall Precaution Comments: instructed no pillow under knee Restrictions Weight Bearing Restrictions: No Other Position/Activity Restrictions: WBAT    Mobility  Bed Mobility Overal bed mobility: Needs Assistance Bed Mobility: Supine to Sit           General bed mobility comments: demonstarted and instructed how to use a belt to self assist LE onto bed  Transfers Overall transfer level: Needs assistance Equipment used: Rolling walker (2 wheeled) Transfers: Sit to/from [facility] Sit to Stand: Supervision Stand pivot transfers: Min guard;Min assist       General transfer comment: 25% VC's on proper hand placement and safety with turns as well as LE extention prior to sit.  Also assisted with toilet transfer.  Ambulation/Gait Ambulation/Gait assistance: Supervision;Min guard Gait Distance (Feet): 75 Feet Assistive device: Rolling walker (2 wheeled) Gait Pattern/deviations: Step-to pattern;Decreased step length - right;Decreased stance time - right;Decreased stride length;Decreased weight shift to right Gait velocity: decreased   General Gait Details: 25% VC's on proper sequencing as well as upright posture and safety with  turns.  Tolerated an  increased distance.   Stairs Stairs: (no stairs to enter home)           Wheelchair Mobility    Modified Rankin (Stroke Patients Only)       Balance                                            Cognition Arousal/Alertness: Awake/alert Behavior During Therapy: WFL for tasks assessed/performed Overall Cognitive Status: Within Functional Limits for tasks assessed                                 General Comments: AxO x 4 pleasant/funny      Exercises      General Comments        Pertinent Vitals/Pain Pain Assessment: 0-10 Pain Score: 5  Pain Location: Rt knee Pain Descriptors / Indicators: Aching;Discomfort;Tightness;Tender;Operative site guarding Pain Intervention(s): Monitored during session;Repositioned;Ice applied;Premedicated before session    Home Living                      Prior Function            PT Goals (current goals can now be found in the care plan section) Progress towards PT goals: Progressing toward goals    Frequency    7X/week      PT Plan Current plan remains appropriate    Co-evaluation  AM-PAC PT "6 Clicks" Mobility   Outcome Measure  Help needed turning from your back to your side while in a flat bed without using bedrails?: A Little Help needed moving from lying on your back to sitting on the side of a flat bed without using bedrails?: A Little Help needed moving to and from a bed to a chair (including a wheelchair)?: A Little Help needed standing up from a chair using your arms (e.g., wheelchair or bedside chair)?: A Little Help needed to walk in hospital room?: A Little Help needed climbing 3-5 steps with a railing? : A Little 6 Click Score: 18    End of Session Equipment Utilized During Treatment: Gait belt Activity Tolerance: Patient tolerated treatment well Patient left: in chair;with call bell/phone within reach;with chair alarm set Nurse Communication:  Mobility status PT Visit Diagnosis: Muscle weakness (generalized) (M62.81);Difficulty in walking, not elsewhere classified (R26.2)     Time: 1335-1400 PT Time Calculation (min) (ACUTE ONLY): 25 min  Charges:  $Gait Training: 8-22 mins $Therapeutic Activity: 8-22 mins                     Rica Koyanagi  PTA Acute  Rehabilitation Services Pager      [phone] Office      [phone]

## 2020-03-15 NOTE — Care Management Important Message (Signed)
Important Message  Patient Details IM Letter given to [name] RN Case Manager to present to the Patient Name: Mathew Simpson MRN: [id number] Date of Birth: 14-Jun-1936   Medicare Important Message Given:  Yes     Kerin Salen 03/15/2020, 1:45 PM

## 2020-03-19 NOTE — Discharge Summary (Signed)
Physician Discharge Summary   Patient ID: Mathew Simpson MRN: [id number] DOB/AGE: 1936-01-31 84 y.o.  Admit date: 03/12/2020 Discharge date: 03/15/2020  Primary Diagnosis: Osteoarthritis Right knee(s)  Admission Diagnoses:  Past Medical History:  Diagnosis Date  . Arthritis    back,  . DJD (degenerative joint disease)   . Dysrhythmia    A-fib  . Glaucoma   . Hypertension    Discharge Diagnoses:   Active Problems:   Primary osteoarthritis of right knee  Estimated body mass index is 28.43 kg/m as calculated from the following:   Height as of this encounter: 5\' 8"  (1.727 m).   Weight as of this encounter: 84.8 kg.  Procedure:  Procedure(s) (LRB): TOTAL KNEE ARTHROPLASTY (Right)   Consults: None  HPI: Mathew Simpson is a 84 y.o. year old male with end stage OA of his right knee with progressively worsening pain and dysfunction. He has constant pain, with activity and at rest and significant functional deficits with difficulties even with ADLs. He has had extensive non-op management including analgesics, injections of cortisone and viscosupplements, and home exercise program, but remains in significant pain with significant dysfunction. Radiographs show bone on bone arthritis medial and patellofemoral. He presents now for right Total Knee Arthroplasty.    Laboratory Data: Admission on 03/12/2020, Discharged on 03/15/2020  Component Date Value Ref Range Status  . WBC 03/13/2020 8.6  4.0 - 10.5 K/uL Final  . RBC 03/13/2020 4.11* 4.22 - 5.81 MIL/uL Final  . Hemoglobin 03/13/2020 12.0* 13.0 - 17.0 g/dL Final  . HCT 03/13/2020 36.7* 39.0 - 52.0 % Final  . MCV 03/13/2020 89.3  80.0 - 100.0 fL Final  . MCH 03/13/2020 29.2  26.0 - 34.0 pg Final  . MCHC 03/13/2020 32.7  30.0 - 36.0 g/dL Final  . RDW 03/13/2020 14.2  11.5 - 15.5 % Final  . Platelets 03/13/2020 180  150 - 400 K/uL Final  . nRBC 03/13/2020 0.0  0.0 - 0.2 % Final   Performed at [facility], [place]  [address] [place], [place] [address]  . Sodium 03/13/2020 137  135 - 145 mmol/L Final  . Potassium 03/13/2020 4.3  3.5 - 5.1 mmol/L Final  . Chloride 03/13/2020 106  98 - 111 mmol/L Final  . CO2 03/13/2020 22  22 - 32 mmol/L Final  . Glucose, Bld 03/13/2020 240* 70 - 99 mg/dL Final   Glucose reference range applies only to samples taken after fasting for at least 8 hours.  . BUN 03/13/2020 20  8 - 23 mg/dL Final  . Creatinine, Ser 03/13/2020 0.92  0.61 - 1.24 mg/dL Final  . Calcium 03/13/2020 8.7* 8.9 - 10.3 mg/dL Final  . GFR calc non Af Amer 03/13/2020 >60  >60 mL/min Final  . GFR calc Af Amer 03/13/2020 >60  >60 mL/min Final  . Anion gap 03/13/2020 9  5 - 15 Final   Performed at [facility], [place] [address], [place], [place] [address]  . WBC 03/14/2020 12.3* 4.0 - 10.5 K/uL Final  . RBC 03/14/2020 3.95* 4.22 - 5.81 MIL/uL Final  . Hemoglobin 03/14/2020 11.5* 13.0 - 17.0 g/dL Final  . HCT 03/14/2020 35.7* 39.0 - 52.0 % Final  . MCV 03/14/2020 90.4  80.0 - 100.0 fL Final  . MCH 03/14/2020 29.1  26.0 - 34.0 pg Final  . MCHC 03/14/2020 32.2  30.0 - 36.0 g/dL Final  . RDW 03/14/2020 14.5  11.5 - 15.5 % Final  . Platelets 03/14/2020  180  150 - 400 K/uL Final  . nRBC 03/14/2020 0.0  0.0 - 0.2 % Final   Performed at [facility], [place] [address] [place], [place] [address]  . Sodium 03/14/2020 137  135 - 145 mmol/L Final  . Potassium 03/14/2020 4.2  3.5 - 5.1 mmol/L Final  . Chloride 03/14/2020 107  98 - 111 mmol/L Final  . CO2 03/14/2020 24  22 - 32 mmol/L Final  . Glucose, Bld 03/14/2020 162* 70 - 99 mg/dL Final   Glucose reference range applies only to samples taken after fasting for at least 8 hours.  . BUN 03/14/2020 20  8 - 23 mg/dL Final  . Creatinine, Ser 03/14/2020 0.73  0.61 - 1.24 mg/dL Final  . Calcium 03/14/2020 8.6* 8.9 - 10.3 mg/dL Final  . GFR calc non Af Amer 03/14/2020 >60  >60 mL/min Final  . GFR calc Af Amer 03/14/2020 >60  >60  mL/min Final  . Anion gap 03/14/2020 6  5 - 15 Final   Performed at [facility], [place] [address], [place], [place] [address]  . WBC 03/15/2020 9.3  4.0 - 10.5 K/uL Final  . RBC 03/15/2020 3.65* 4.22 - 5.81 MIL/uL Final  . Hemoglobin 03/15/2020 11.0* 13.0 - 17.0 g/dL Final  . HCT 03/15/2020 33.5* 39.0 - 52.0 % Final  . MCV 03/15/2020 91.8  80.0 - 100.0 fL Final  . MCH 03/15/2020 30.1  26.0 - 34.0 pg Final  . MCHC 03/15/2020 32.8  30.0 - 36.0 g/dL Final  . RDW 03/15/2020 14.8  11.5 - 15.5 % Final  . Platelets 03/15/2020 151  150 - 400 K/uL Final  . nRBC 03/15/2020 0.0  0.0 - 0.2 % Final   Performed at [facility], [place] [address], [place], [place] [address]  Hospital Outpatient Visit on 03/08/2020  Component Date Value Ref Range Status  . SARS Coronavirus 2 03/08/2020 NEGATIVE  NEGATIVE Final   Comment: (NOTE) SARS-CoV-2 target nucleic acids are NOT DETECTED. The SARS-CoV-2 RNA is generally detectable in upper and lower respiratory specimens during the acute phase of infection. Negative results do not preclude SARS-CoV-2 infection, do not rule out co-infections with other pathogens, and should not be used as the sole basis for treatment or other patient management decisions. Negative results must be combined with clinical observations, patient history, and epidemiological information. The expected result is Negative. Fact Sheet for Patients: SugarRoll.be Fact Sheet for Healthcare Providers: https://www.woods-mathews.com/ This test is not yet approved or cleared by the [place] FDA and  has been authorized for detection and/or diagnosis of SARS-CoV-2 by FDA under an Emergency Use Authorization (EUA). This EUA will remain  in effect (meaning this test can be used) for the duration of the COVID-19 declaration under Section 56                          4(b)(1) of the Act, 21 U.S.C. section 360bbb-3(b)(1),  unless the authorization is terminated or revoked sooner. Performed at [facility], [place] [address], [place], [place] [address]   Hospital Outpatient Visit on 03/02/2020  Component Date Value Ref Range Status  . aPTT 03/02/2020 26  24 - 36 seconds Final   Performed at [facility], [place] [address] [place],  [address]  . WBC 03/02/2020 6.2  4.0 - 10.5 K/uL Final  . RBC 03/02/2020 4.64  4.22 - 5.81 MIL/uL Final  . Hemoglobin 03/02/2020  13.7  13.0 - 17.0 g/dL Final  . HCT 03/02/2020 41.9  39.0 - 52.0 % Final  . MCV 03/02/2020 90.3  80.0 - 100.0 fL Final  . MCH 03/02/2020 29.5  26.0 - 34.0 pg Final  . MCHC 03/02/2020 32.7  30.0 - 36.0 g/dL Final  . RDW 03/02/2020 14.2  11.5 - 15.5 % Final  . Platelets 03/02/2020 199  150 - 400 K/uL Final  . nRBC 03/02/2020 0.0  0.0 - 0.2 % Final   Performed at [facility], [place] [address] [place], [place] [address]  . Sodium 03/02/2020 142  135 - 145 mmol/L Final  . Potassium 03/02/2020 3.9  3.5 - 5.1 mmol/L Final  . Chloride 03/02/2020 107  98 - 111 mmol/L Final  . CO2 03/02/2020 25  22 - 32 mmol/L Final  . Glucose, Bld 03/02/2020 208* 70 - 99 mg/dL Final   Glucose reference range applies only to samples taken after fasting for at least 8 hours.  . BUN 03/02/2020 23  8 - 23 mg/dL Final  . Creatinine, Ser 03/02/2020 1.03  0.61 - 1.24 mg/dL Final  . Calcium 03/02/2020 9.1  8.9 - 10.3 mg/dL Final  . Total Protein 03/02/2020 6.5  6.5 - 8.1 g/dL Final  . Albumin 03/02/2020 4.0  3.5 - 5.0 g/dL Final  . AST 03/02/2020 23  15 - 41 U/L Final  . ALT 03/02/2020 17  0 - 44 U/L Final  . Alkaline Phosphatase 03/02/2020 78  38 - 126 U/L Final  . Total Bilirubin 03/02/2020 0.7  0.3 - 1.2 mg/dL Final  . GFR calc non Af Amer 03/02/2020 >60  >60 mL/min Final  . GFR calc Af Amer 03/02/2020 >60  >60 mL/min Final  . Anion gap 03/02/2020 10  5 - 15 Final   Performed at [facility], [place] [address] [place], [place] [address]  . Prothrombin Time 03/02/2020 15.1  11.4 - 15.2 seconds Final  . INR 03/02/2020 1.2  0.8 - 1.2 Final   Comment: (NOTE) INR goal varies based on device and disease states. Performed at [facility], [place] [address], [place], [place] [address]   . ABO/RH(D) 03/02/2020 O POS   Final  . Antibody Screen 03/02/2020 NEG   Final  . Sample Expiration 03/02/2020 03/15/2020,2359   Final  . Extend sample reason 03/02/2020    Final                   Value:NO TRANSFUSIONS OR PREGNANCY IN THE PAST 3 MONTHS Performed at [facility], [place] [address] [place], [place] [address]   . MRSA, PCR 03/02/2020 NEGATIVE  NEGATIVE Final  . Staphylococcus aureus 03/02/2020 NEGATIVE  NEGATIVE Final   Comment: (NOTE) The Xpert SA Assay (FDA approved for NASAL specimens in patients 9 years of age and older), is one component of a comprehensive surveillance program. It is not intended to diagnose infection nor to guide or monitor treatment. Performed at [facility], [place] [address] [place], [place] [address]      X-Rays:No results found.  EKG: Orders placed or performed in visit on 03/02/20  . EKG 12-Lead     Hospital Course: Mathew Simpson is a 84 y.o. who was admitted to [facility]. They were brought to the operating room on 03/12/2020 and underwent Procedure(s): TOTAL KNEE ARTHROPLASTY.  Patient tolerated the procedure well and was later transferred to the recovery room and then to  the orthopaedic floor for postoperative care. They were given PO and IV analgesics for pain control following their surgery. They were given 24 hours of postoperative antibiotics of  Anti-infectives (From admission, onward)   Start     Dose/Rate Route Frequency Ordered Stop   03/13/20 0100  vancomycin (VANCOCIN) IVPB 1000 mg/200 mL premix     1,000 mg 200 mL/hr over 60 Minutes Intravenous Every 12 hours 03/12/20 1619 03/13/20 0206    03/12/20 1030  vancomycin (VANCOCIN) IVPB 1000 mg/200 mL premix     1,000 mg 200 mL/hr over 60 Minutes Intravenous On call to O.R. 03/12/20 1021 03/12/20 1358     and started on DVT prophylaxis in the form of Eliquis.   PT and OT were ordered for total joint protocol. Discharge planning consulted to help with postop disposition and equipment needs. Patient had a good night on the evening of surgery. They started to get up OOB with therapy on POD #0. Hemovac drain was pulled without difficulty on day one. Continued to work with therapy into POD #2. He was doing very well, but we decided to keep him another night due to previous narcotic related encephalopathy with recent surgery. Pt was seen during rounds on day three and was ready to go home pending progress with therapy. Dressing was changed and the incision was clean and intact. Pt worked with therapy for two additional sessions and was meeting their goals. He was discharged to home later that day in stable condition.  Diet: Regular diet Activity: WBAT Follow-up: in 2 weeks Disposition: Home Discharged Condition: good   Discharge Instructions    Call MD / Call 911   Complete by: As directed    If you experience chest pain or shortness of breath, CALL 911 and be transported to the hospital emergency room.  If you develope a fever above 101 F, pus (white drainage) or increased drainage or redness at the wound, or calf pain, call your surgeon's office.   Change dressing   Complete by: As directed    You may remove the bulky bandage (ACE wrap and gauze) two days after surgery. You will have an adhesive waterproof bandage underneath. Leave this in place until your first follow-up appointment.   Constipation Prevention   Complete by: As directed    Drink plenty of fluids.  Prune juice may be helpful.  You may use a stool softener, such as Colace (over the counter) 100 mg twice a day.  Use MiraLax (over the counter) for constipation as needed.    Diet - low sodium heart healthy   Complete by: As directed    Do not put a pillow under the knee. Place it under the heel.   Complete by: As directed    Driving restrictions   Complete by: As directed    No driving for two weeks   TED hose   Complete by: As directed    Use stockings (TED hose) for three weeks on both leg(s).  You may remove them at night for sleeping.   Weight bearing as tolerated   Complete by: As directed      Allergies as of 03/15/2020      Reactions   Penicillins Anaphylaxis   Did it involve swelling of the face/tongue/throat, SOB, or low BP? Yes Did it involve sudden or severe rash/hives, skin peeling, or any reaction on the inside of your mouth or nose? Unknown Did you need to seek medical attention at a hospital or doctor's  office? Yes When did it last happen?50+ years If all above answers are "NO", may proceed with cephalosporin use.      Medication List    TAKE these medications   CALCIUM + D PO Take 1 tablet by mouth 2 (two) times daily.   Dialyvite Vitamin D 5000 125 MCG (5000 UT) capsule Generic drug: Cholecalciferol Take 5,000 Units by mouth every evening.   diltiazem 120 MG 24 hr capsule Commonly known as: TIAZAC Take 120 mg by mouth daily.   Eliquis 5 MG Tabs tablet Generic drug: apixaban Take 5 mg by mouth 2 (two) times daily.   HYDROcodone-acetaminophen 7.5-325 MG tablet Commonly known as: NORCO Take 1-2 tablets by mouth every 6 (six) hours as needed for severe pain. What changed: how much to take   lisinopril 20 MG tablet Commonly known as: ZESTRIL Take 0.5 tablets (10 mg total) by mouth daily. What changed:   how much to take  when to take this   methocarbamol 500 MG tablet Commonly known as: ROBAXIN Take 1 tablet (500 mg total) by mouth every 6 (six) hours as needed for muscle spasms.   multivitamin with minerals Tabs tablet Take 1 tablet by mouth daily at 2 PM.   PRESERVISION AREDS 2 PO Take 1 tablet by mouth  in the morning and at bedtime.   trimethoprim-polymyxin b ophthalmic solution Commonly known as: POLYTRIM Place 1 drop into the left eye See admin instructions. Instill 1 drop into left eye 4 times daily for 2 days after eye injection            Discharge Care Instructions  (From admission, onward)         Start     Ordered   03/15/20 0000  Weight bearing as tolerated     03/15/20 0736   03/15/20 0000  Change dressing    Comments: You may remove the bulky bandage (ACE wrap and gauze) two days after surgery. You will have an adhesive waterproof bandage underneath. Leave this in place until your first follow-up appointment.   03/15/20 0736         Follow-up Information    Gaynelle Arabian, MD. Schedule an appointment as soon as possible for a visit on 03/27/2020.   Specialty: Orthopedic Surgery Contact information: [address] [place] [address] [id number]        [facility] Follow up.   Specialty: [facility] Contact information: [place] [address] [phone]           Signed: Griffith Citron, PA-C Orthopedic Surgery 03/19/2020, 3:16 PM

## 2020-03-29 ENCOUNTER — Encounter (INDEPENDENT_AMBULATORY_CARE_PROVIDER_SITE_OTHER): Payer: Medicare HMO | Admitting: Ophthalmology

## 2020-04-03 ENCOUNTER — Encounter (INDEPENDENT_AMBULATORY_CARE_PROVIDER_SITE_OTHER): Payer: Medicare HMO | Admitting: Ophthalmology

## 2020-04-03 ENCOUNTER — Other Ambulatory Visit: Payer: Self-pay

## 2020-04-03 DIAGNOSIS — I10 Essential (primary) hypertension: Secondary | ICD-10-CM | POA: Diagnosis not present

## 2020-04-03 DIAGNOSIS — H35033 Hypertensive retinopathy, bilateral: Secondary | ICD-10-CM

## 2020-04-03 DIAGNOSIS — H34832 Tributary (branch) retinal vein occlusion, left eye, with macular edema: Secondary | ICD-10-CM

## 2020-04-03 DIAGNOSIS — H353132 Nonexudative age-related macular degeneration, bilateral, intermediate dry stage: Secondary | ICD-10-CM

## 2020-04-03 DIAGNOSIS — H43813 Vitreous degeneration, bilateral: Secondary | ICD-10-CM

## 2020-04-03 DIAGNOSIS — H35372 Puckering of macula, left eye: Secondary | ICD-10-CM

## 2020-04-03 DIAGNOSIS — D3131 Benign neoplasm of right choroid: Secondary | ICD-10-CM

## 2020-05-02 ENCOUNTER — Other Ambulatory Visit: Payer: Self-pay

## 2020-05-02 ENCOUNTER — Encounter (INDEPENDENT_AMBULATORY_CARE_PROVIDER_SITE_OTHER): Payer: Medicare HMO | Admitting: Ophthalmology

## 2020-05-02 DIAGNOSIS — H35033 Hypertensive retinopathy, bilateral: Secondary | ICD-10-CM | POA: Diagnosis not present

## 2020-05-02 DIAGNOSIS — H353132 Nonexudative age-related macular degeneration, bilateral, intermediate dry stage: Secondary | ICD-10-CM | POA: Diagnosis not present

## 2020-05-02 DIAGNOSIS — H34832 Tributary (branch) retinal vein occlusion, left eye, with macular edema: Secondary | ICD-10-CM

## 2020-05-02 DIAGNOSIS — H35372 Puckering of macula, left eye: Secondary | ICD-10-CM

## 2020-05-02 DIAGNOSIS — D3131 Benign neoplasm of right choroid: Secondary | ICD-10-CM

## 2020-05-02 DIAGNOSIS — I10 Essential (primary) hypertension: Secondary | ICD-10-CM | POA: Diagnosis not present

## 2020-05-02 DIAGNOSIS — H43813 Vitreous degeneration, bilateral: Secondary | ICD-10-CM

## 2020-06-06 ENCOUNTER — Other Ambulatory Visit: Payer: Self-pay

## 2020-06-06 ENCOUNTER — Encounter (INDEPENDENT_AMBULATORY_CARE_PROVIDER_SITE_OTHER): Payer: Medicare HMO | Admitting: Ophthalmology

## 2020-06-06 DIAGNOSIS — D3131 Benign neoplasm of right choroid: Secondary | ICD-10-CM

## 2020-06-06 DIAGNOSIS — H35033 Hypertensive retinopathy, bilateral: Secondary | ICD-10-CM | POA: Diagnosis not present

## 2020-06-06 DIAGNOSIS — H43813 Vitreous degeneration, bilateral: Secondary | ICD-10-CM

## 2020-06-06 DIAGNOSIS — H34832 Tributary (branch) retinal vein occlusion, left eye, with macular edema: Secondary | ICD-10-CM | POA: Diagnosis not present

## 2020-06-06 DIAGNOSIS — I10 Essential (primary) hypertension: Secondary | ICD-10-CM

## 2020-06-06 DIAGNOSIS — H353132 Nonexudative age-related macular degeneration, bilateral, intermediate dry stage: Secondary | ICD-10-CM

## 2020-07-11 ENCOUNTER — Encounter (INDEPENDENT_AMBULATORY_CARE_PROVIDER_SITE_OTHER): Payer: Medicare HMO | Admitting: Ophthalmology

## 2020-07-11 ENCOUNTER — Other Ambulatory Visit: Payer: Self-pay

## 2020-07-11 DIAGNOSIS — H35033 Hypertensive retinopathy, bilateral: Secondary | ICD-10-CM

## 2020-07-11 DIAGNOSIS — I10 Essential (primary) hypertension: Secondary | ICD-10-CM | POA: Diagnosis not present

## 2020-07-11 DIAGNOSIS — D3131 Benign neoplasm of right choroid: Secondary | ICD-10-CM

## 2020-07-11 DIAGNOSIS — H34832 Tributary (branch) retinal vein occlusion, left eye, with macular edema: Secondary | ICD-10-CM

## 2020-07-11 DIAGNOSIS — H35372 Puckering of macula, left eye: Secondary | ICD-10-CM

## 2020-07-11 DIAGNOSIS — H43813 Vitreous degeneration, bilateral: Secondary | ICD-10-CM

## 2020-07-11 DIAGNOSIS — H353131 Nonexudative age-related macular degeneration, bilateral, early dry stage: Secondary | ICD-10-CM | POA: Diagnosis not present

## 2020-08-15 ENCOUNTER — Encounter (INDEPENDENT_AMBULATORY_CARE_PROVIDER_SITE_OTHER): Payer: Medicare HMO | Admitting: Ophthalmology

## 2020-08-15 ENCOUNTER — Other Ambulatory Visit: Payer: Self-pay

## 2020-08-15 DIAGNOSIS — H34832 Tributary (branch) retinal vein occlusion, left eye, with macular edema: Secondary | ICD-10-CM | POA: Diagnosis not present

## 2020-08-15 DIAGNOSIS — H35033 Hypertensive retinopathy, bilateral: Secondary | ICD-10-CM

## 2020-08-15 DIAGNOSIS — D3131 Benign neoplasm of right choroid: Secondary | ICD-10-CM

## 2020-08-15 DIAGNOSIS — H35372 Puckering of macula, left eye: Secondary | ICD-10-CM

## 2020-08-15 DIAGNOSIS — H353132 Nonexudative age-related macular degeneration, bilateral, intermediate dry stage: Secondary | ICD-10-CM

## 2020-08-15 DIAGNOSIS — I10 Essential (primary) hypertension: Secondary | ICD-10-CM

## 2020-08-15 DIAGNOSIS — H43813 Vitreous degeneration, bilateral: Secondary | ICD-10-CM

## 2020-09-24 ENCOUNTER — Encounter (INDEPENDENT_AMBULATORY_CARE_PROVIDER_SITE_OTHER): Payer: Medicare HMO | Admitting: Ophthalmology

## 2020-09-24 ENCOUNTER — Other Ambulatory Visit: Payer: Self-pay

## 2020-09-24 DIAGNOSIS — H35033 Hypertensive retinopathy, bilateral: Secondary | ICD-10-CM

## 2020-09-24 DIAGNOSIS — D3131 Benign neoplasm of right choroid: Secondary | ICD-10-CM

## 2020-09-24 DIAGNOSIS — I10 Essential (primary) hypertension: Secondary | ICD-10-CM

## 2020-09-24 DIAGNOSIS — H43813 Vitreous degeneration, bilateral: Secondary | ICD-10-CM

## 2020-09-24 DIAGNOSIS — H34832 Tributary (branch) retinal vein occlusion, left eye, with macular edema: Secondary | ICD-10-CM | POA: Diagnosis not present

## 2020-09-24 DIAGNOSIS — H353132 Nonexudative age-related macular degeneration, bilateral, intermediate dry stage: Secondary | ICD-10-CM | POA: Diagnosis not present

## 2020-10-10 IMAGING — CT CT ABD-PELV W/ CM
2 of 5 series · 16 of 46 positions shown, 18 images · IV contrast (APPLIED)
Comparison: 100 mL Omnipaque 300

CLINICAL DATA: abd pain , eval for bowel obstruction Bowel
obstruction, low-grade

EXAM:
CT ABDOMEN AND PELVIS WITH CONTRAST
TECHNIQUE: Multidetector CT imaging of the abdomen and pelvis was performed
using the standard protocol following bolus administration of
intravenous contrast.
CONTRAST:  30mL OMNIPAQUE IOHEXOL 300 MG/ML SOLN, 100mL OMNIPAQUE
IOHEXOL 300 MG/ML SOLN

[Series 2: axial st · axial · 0.85mm/px · z∈[-483,-73]mm · 13 of 96 slices shown, 15 images]
[im 7/96  soft-tissue]
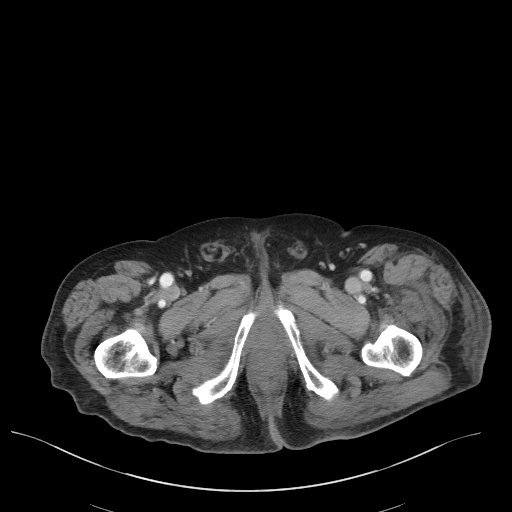
[im 7/96  bone]
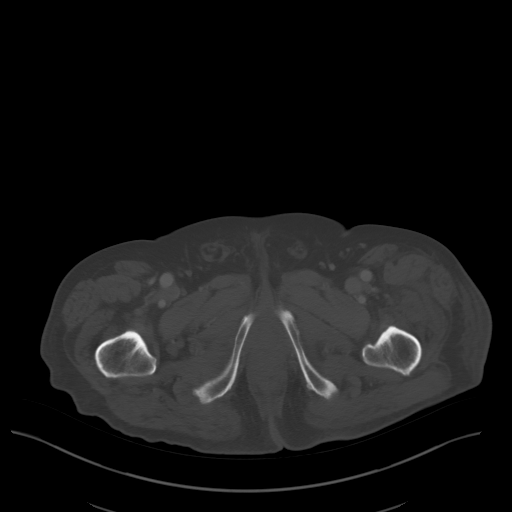
[im 14/96  soft-tissue]
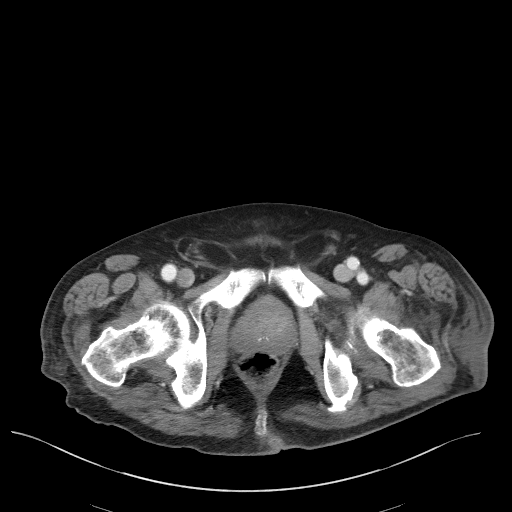
[im 21/96  soft-tissue]
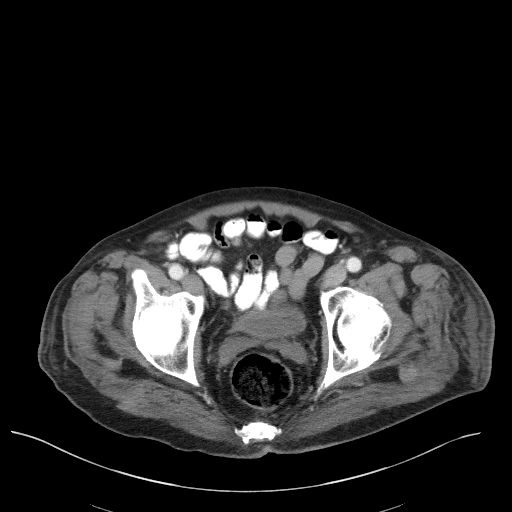
[im 28/96  soft-tissue]
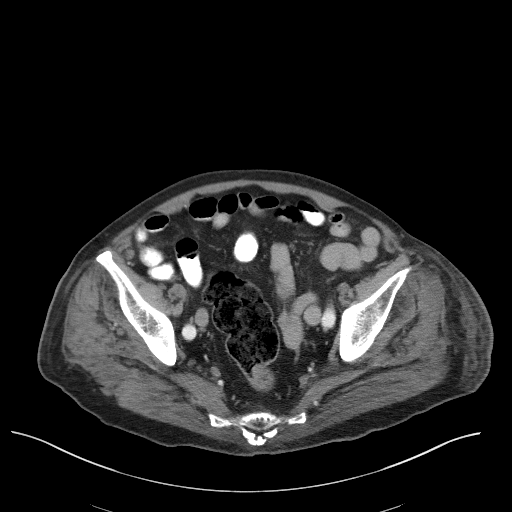
[im 34/96  soft-tissue]
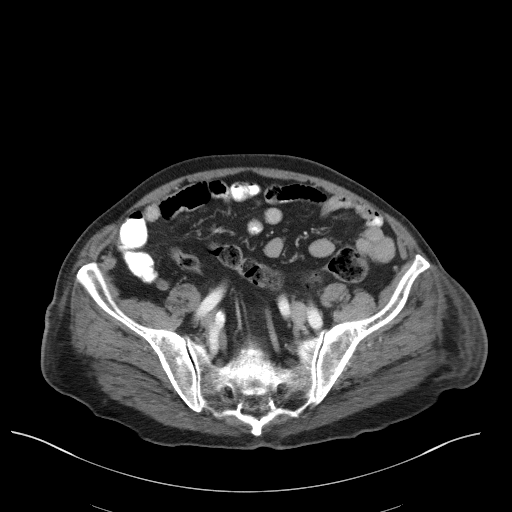
[im 41/96  soft-tissue]
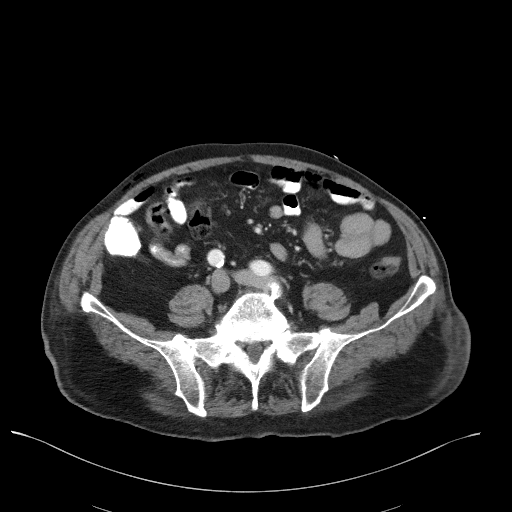
[im 48/96  soft-tissue]
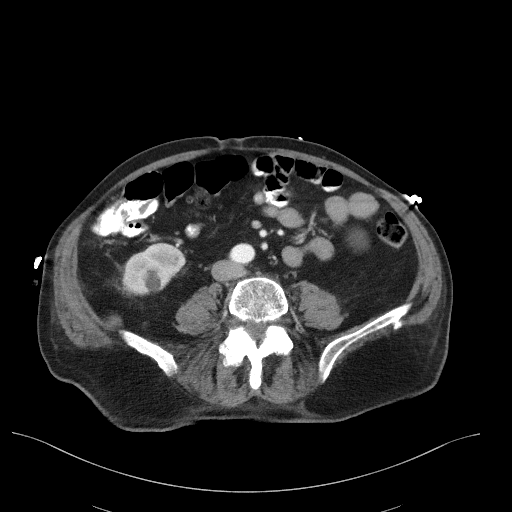
[im 55/96  soft-tissue]
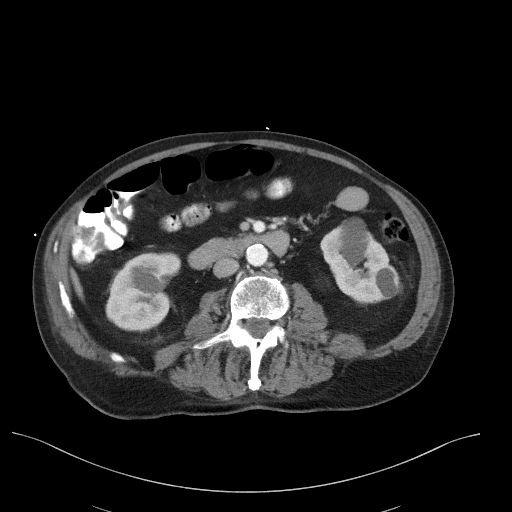
[im 62/96  soft-tissue]
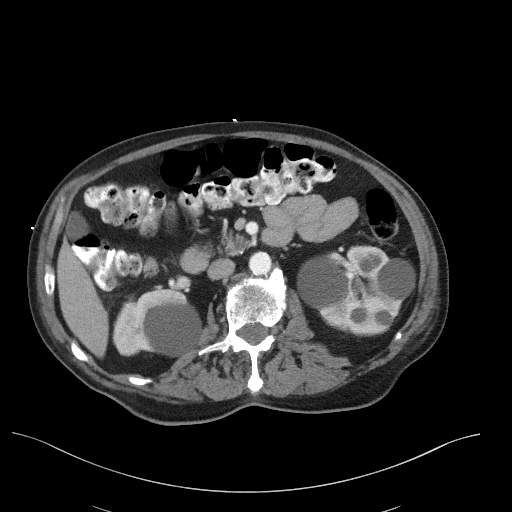
[im 62/96  bone]
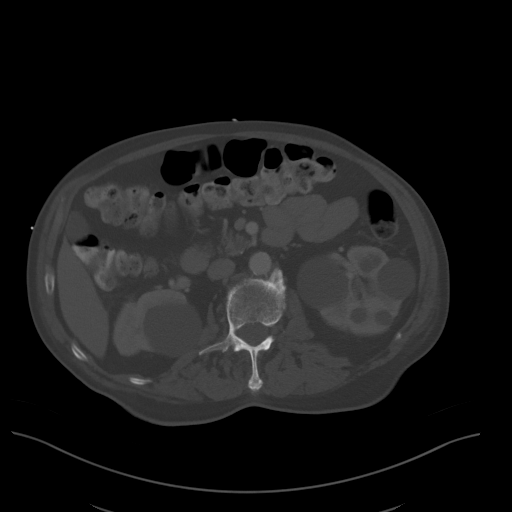
[im 68/96  soft-tissue]
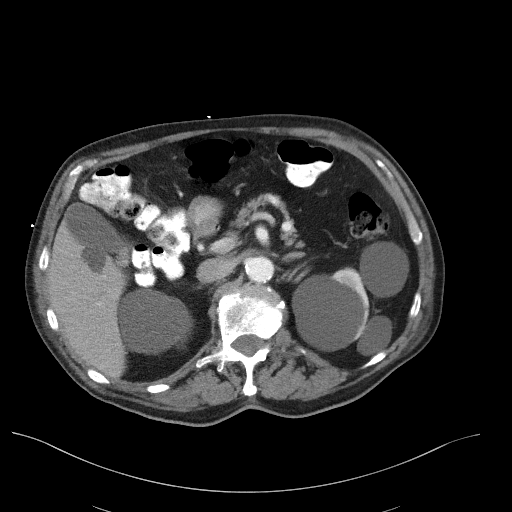
[im 75/96  soft-tissue]
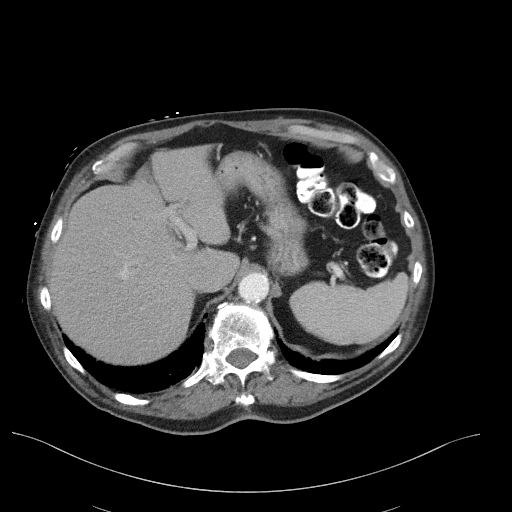
[im 82/96  soft-tissue]
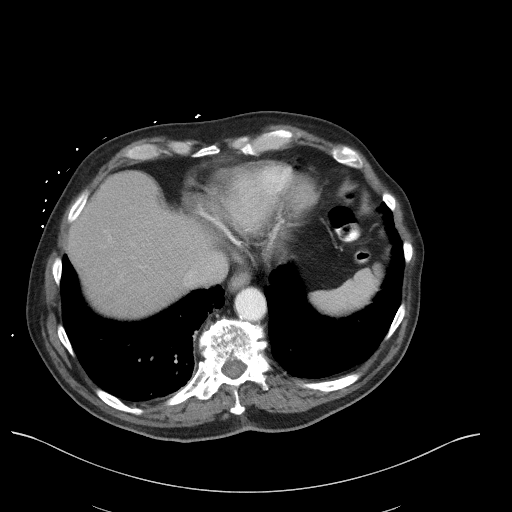
[im 89/96  soft-tissue]
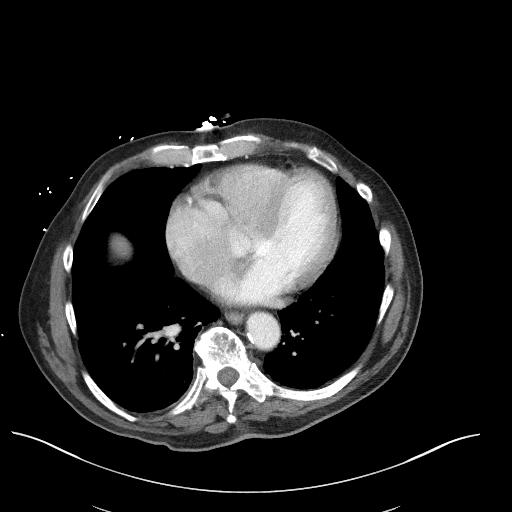

[Series 5: coronal st · coronal · 0.76mm/px · 3 of 87 slices shown]
[im 29/87  soft-tissue]
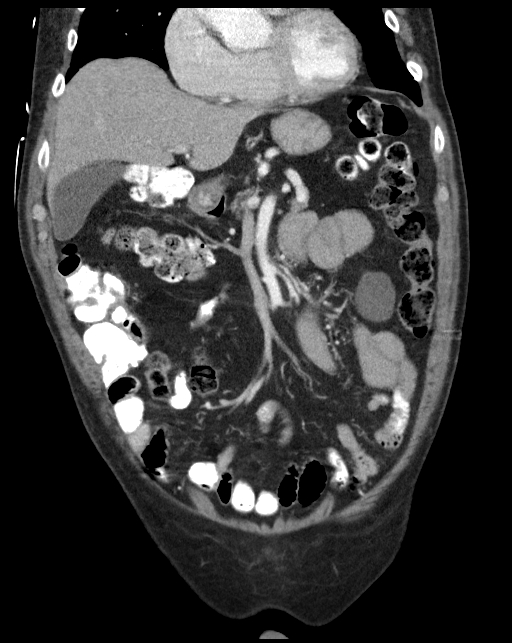
[im 39/87  soft-tissue]
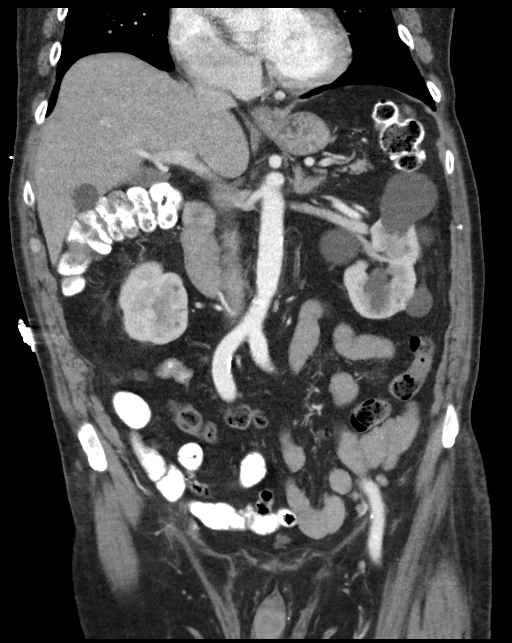
[im 48/87  soft-tissue]
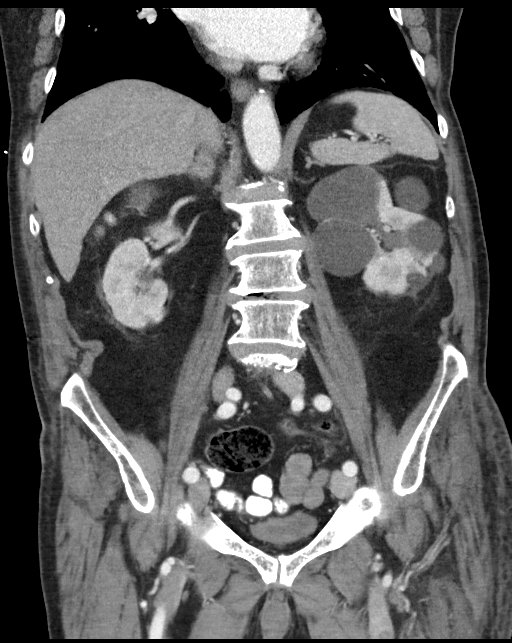

[16 of 46 positions shown; findings below may reference images not displayed]

FINDINGS: Lower chest: Lung bases are clear.

Hepatobiliary: Small cyst in the RIGHT hepatic lobe. No gallbladder
abnormality.

Enhancing 10 mm lesion in the RIGHT hepatic lobe (segment 8) is
favored benign vascular lesion (image [DATE]).

Pancreas: Pancreas is normal. No ductal dilatation. No pancreatic
inflammation.

Spleen: Normal spleen

Adrenals/urinary tract: Adrenal glands normal. Multiple simple fluid
attenuation renal cysts. Ureters and bladder normal

Stomach/Bowel: Stomach, small bowel cecum normal. appendix is
inflammation.

Ascending, transverse descending colon normal. No bowel obstruction.
No bowel inflammation.

Vascular/Lymphatic: Abdominal aorta is normal caliber with
atherosclerotic calcification. There is no retroperitoneal or
periportal lymphadenopathy. No pelvic lymphadenopathy.

Reproductive: Prostate normal.

Other: Bilateral fat filled inguinal hernias.

Musculoskeletal: No aggressive osseous lesion.
IMPRESSION: 1. No acute findings in the abdomen pelvis.
2. No evidence of bowel obstruction or bowel inflammation.
3. Appendix is prominent but there is no evidence of para
appendiceal inflammation to suggest acute appendicitis.
4. Small enhancing lesion the RIGHT hepatic lobe. In the absence of
malignancy favored benign vascular lesion.
5. Multiple benign-appearing renal cysts.
6. Moderate volume stool in the rectosigmoid colon.

## 2020-11-05 ENCOUNTER — Encounter (INDEPENDENT_AMBULATORY_CARE_PROVIDER_SITE_OTHER): Payer: Medicare HMO | Admitting: Ophthalmology

## 2020-11-05 ENCOUNTER — Other Ambulatory Visit: Payer: Self-pay

## 2020-11-05 DIAGNOSIS — H34832 Tributary (branch) retinal vein occlusion, left eye, with macular edema: Secondary | ICD-10-CM

## 2020-11-05 DIAGNOSIS — D3131 Benign neoplasm of right choroid: Secondary | ICD-10-CM

## 2020-11-05 DIAGNOSIS — I10 Essential (primary) hypertension: Secondary | ICD-10-CM | POA: Diagnosis not present

## 2020-11-05 DIAGNOSIS — H353132 Nonexudative age-related macular degeneration, bilateral, intermediate dry stage: Secondary | ICD-10-CM | POA: Diagnosis not present

## 2020-11-05 DIAGNOSIS — H35033 Hypertensive retinopathy, bilateral: Secondary | ICD-10-CM | POA: Diagnosis not present

## 2020-11-05 DIAGNOSIS — H43813 Vitreous degeneration, bilateral: Secondary | ICD-10-CM

## 2020-12-10 ENCOUNTER — Encounter (INDEPENDENT_AMBULATORY_CARE_PROVIDER_SITE_OTHER): Payer: Medicare HMO | Admitting: Ophthalmology

## 2020-12-10 ENCOUNTER — Other Ambulatory Visit: Payer: Self-pay

## 2020-12-10 DIAGNOSIS — H34832 Tributary (branch) retinal vein occlusion, left eye, with macular edema: Secondary | ICD-10-CM | POA: Diagnosis not present

## 2020-12-10 DIAGNOSIS — H35033 Hypertensive retinopathy, bilateral: Secondary | ICD-10-CM | POA: Diagnosis not present

## 2020-12-10 DIAGNOSIS — D3131 Benign neoplasm of right choroid: Secondary | ICD-10-CM | POA: Diagnosis not present

## 2020-12-10 DIAGNOSIS — H353132 Nonexudative age-related macular degeneration, bilateral, intermediate dry stage: Secondary | ICD-10-CM | POA: Diagnosis not present

## 2020-12-10 DIAGNOSIS — H43813 Vitreous degeneration, bilateral: Secondary | ICD-10-CM

## 2020-12-10 DIAGNOSIS — I10 Essential (primary) hypertension: Secondary | ICD-10-CM | POA: Diagnosis not present

## 2021-01-21 ENCOUNTER — Encounter (INDEPENDENT_AMBULATORY_CARE_PROVIDER_SITE_OTHER): Payer: Medicare HMO | Admitting: Ophthalmology

## 2021-01-21 ENCOUNTER — Other Ambulatory Visit: Payer: Self-pay

## 2021-01-21 DIAGNOSIS — D3131 Benign neoplasm of right choroid: Secondary | ICD-10-CM

## 2021-01-21 DIAGNOSIS — H353131 Nonexudative age-related macular degeneration, bilateral, early dry stage: Secondary | ICD-10-CM

## 2021-01-21 DIAGNOSIS — H35033 Hypertensive retinopathy, bilateral: Secondary | ICD-10-CM | POA: Diagnosis not present

## 2021-01-21 DIAGNOSIS — H34832 Tributary (branch) retinal vein occlusion, left eye, with macular edema: Secondary | ICD-10-CM

## 2021-01-21 DIAGNOSIS — I10 Essential (primary) hypertension: Secondary | ICD-10-CM

## 2021-01-21 DIAGNOSIS — H43813 Vitreous degeneration, bilateral: Secondary | ICD-10-CM

## 2021-03-04 ENCOUNTER — Other Ambulatory Visit: Payer: Self-pay

## 2021-03-04 ENCOUNTER — Encounter (INDEPENDENT_AMBULATORY_CARE_PROVIDER_SITE_OTHER): Payer: Medicare HMO | Admitting: Ophthalmology

## 2021-03-04 DIAGNOSIS — H34832 Tributary (branch) retinal vein occlusion, left eye, with macular edema: Secondary | ICD-10-CM | POA: Diagnosis not present

## 2021-03-04 DIAGNOSIS — H353132 Nonexudative age-related macular degeneration, bilateral, intermediate dry stage: Secondary | ICD-10-CM | POA: Diagnosis not present

## 2021-03-04 DIAGNOSIS — H43813 Vitreous degeneration, bilateral: Secondary | ICD-10-CM

## 2021-03-04 DIAGNOSIS — H35033 Hypertensive retinopathy, bilateral: Secondary | ICD-10-CM | POA: Diagnosis not present

## 2021-03-04 DIAGNOSIS — I10 Essential (primary) hypertension: Secondary | ICD-10-CM | POA: Diagnosis not present

## 2021-03-04 DIAGNOSIS — D3131 Benign neoplasm of right choroid: Secondary | ICD-10-CM

## 2021-04-15 ENCOUNTER — Encounter (INDEPENDENT_AMBULATORY_CARE_PROVIDER_SITE_OTHER): Payer: Medicare HMO | Admitting: Ophthalmology

## 2021-04-15 ENCOUNTER — Other Ambulatory Visit: Payer: Self-pay

## 2021-04-15 DIAGNOSIS — I10 Essential (primary) hypertension: Secondary | ICD-10-CM | POA: Diagnosis not present

## 2021-04-15 DIAGNOSIS — H34832 Tributary (branch) retinal vein occlusion, left eye, with macular edema: Secondary | ICD-10-CM

## 2021-04-15 DIAGNOSIS — H35033 Hypertensive retinopathy, bilateral: Secondary | ICD-10-CM | POA: Diagnosis not present

## 2021-04-15 DIAGNOSIS — H43813 Vitreous degeneration, bilateral: Secondary | ICD-10-CM

## 2021-04-15 DIAGNOSIS — H353132 Nonexudative age-related macular degeneration, bilateral, intermediate dry stage: Secondary | ICD-10-CM | POA: Diagnosis not present

## 2021-04-15 DIAGNOSIS — D3131 Benign neoplasm of right choroid: Secondary | ICD-10-CM

## 2021-05-27 ENCOUNTER — Encounter (INDEPENDENT_AMBULATORY_CARE_PROVIDER_SITE_OTHER): Payer: Medicare HMO | Admitting: Ophthalmology

## 2021-08-07 ENCOUNTER — Encounter (INDEPENDENT_AMBULATORY_CARE_PROVIDER_SITE_OTHER): Payer: Medicare HMO | Admitting: Ophthalmology

## 2021-08-07 ENCOUNTER — Other Ambulatory Visit: Payer: Self-pay

## 2021-08-07 DIAGNOSIS — H43813 Vitreous degeneration, bilateral: Secondary | ICD-10-CM

## 2021-08-07 DIAGNOSIS — I10 Essential (primary) hypertension: Secondary | ICD-10-CM | POA: Diagnosis not present

## 2021-08-07 DIAGNOSIS — H35033 Hypertensive retinopathy, bilateral: Secondary | ICD-10-CM | POA: Diagnosis not present

## 2021-08-07 DIAGNOSIS — H34832 Tributary (branch) retinal vein occlusion, left eye, with macular edema: Secondary | ICD-10-CM

## 2021-08-07 DIAGNOSIS — H353132 Nonexudative age-related macular degeneration, bilateral, intermediate dry stage: Secondary | ICD-10-CM

## 2021-08-07 DIAGNOSIS — D3131 Benign neoplasm of right choroid: Secondary | ICD-10-CM

## 2021-10-23 ENCOUNTER — Other Ambulatory Visit: Payer: Self-pay

## 2021-10-23 ENCOUNTER — Encounter (INDEPENDENT_AMBULATORY_CARE_PROVIDER_SITE_OTHER): Payer: Medicare HMO | Admitting: Ophthalmology

## 2021-10-23 DIAGNOSIS — D3131 Benign neoplasm of right choroid: Secondary | ICD-10-CM

## 2021-10-23 DIAGNOSIS — H353132 Nonexudative age-related macular degeneration, bilateral, intermediate dry stage: Secondary | ICD-10-CM | POA: Diagnosis not present

## 2021-10-23 DIAGNOSIS — H34832 Tributary (branch) retinal vein occlusion, left eye, with macular edema: Secondary | ICD-10-CM | POA: Diagnosis not present

## 2021-10-23 DIAGNOSIS — I10 Essential (primary) hypertension: Secondary | ICD-10-CM | POA: Diagnosis not present

## 2021-10-23 DIAGNOSIS — H35033 Hypertensive retinopathy, bilateral: Secondary | ICD-10-CM

## 2021-10-23 DIAGNOSIS — H43813 Vitreous degeneration, bilateral: Secondary | ICD-10-CM

## 2022-01-15 ENCOUNTER — Other Ambulatory Visit: Payer: Self-pay

## 2022-01-15 ENCOUNTER — Encounter (INDEPENDENT_AMBULATORY_CARE_PROVIDER_SITE_OTHER): Payer: Medicare HMO | Admitting: Ophthalmology

## 2022-01-15 DIAGNOSIS — I10 Essential (primary) hypertension: Secondary | ICD-10-CM

## 2022-01-15 DIAGNOSIS — H35033 Hypertensive retinopathy, bilateral: Secondary | ICD-10-CM

## 2022-01-15 DIAGNOSIS — H34832 Tributary (branch) retinal vein occlusion, left eye, with macular edema: Secondary | ICD-10-CM | POA: Diagnosis not present

## 2022-01-15 DIAGNOSIS — H353132 Nonexudative age-related macular degeneration, bilateral, intermediate dry stage: Secondary | ICD-10-CM | POA: Diagnosis not present

## 2022-01-15 DIAGNOSIS — D3131 Benign neoplasm of right choroid: Secondary | ICD-10-CM

## 2022-04-16 ENCOUNTER — Encounter (INDEPENDENT_AMBULATORY_CARE_PROVIDER_SITE_OTHER): Payer: Medicare HMO | Admitting: Ophthalmology

## 2022-04-16 DIAGNOSIS — H353132 Nonexudative age-related macular degeneration, bilateral, intermediate dry stage: Secondary | ICD-10-CM

## 2022-04-16 DIAGNOSIS — H34832 Tributary (branch) retinal vein occlusion, left eye, with macular edema: Secondary | ICD-10-CM | POA: Diagnosis not present

## 2022-04-16 DIAGNOSIS — D3131 Benign neoplasm of right choroid: Secondary | ICD-10-CM

## 2022-04-16 DIAGNOSIS — H43813 Vitreous degeneration, bilateral: Secondary | ICD-10-CM

## 2022-04-16 DIAGNOSIS — I10 Essential (primary) hypertension: Secondary | ICD-10-CM

## 2022-04-16 DIAGNOSIS — H35033 Hypertensive retinopathy, bilateral: Secondary | ICD-10-CM | POA: Diagnosis not present

## 2022-04-16 DIAGNOSIS — H35372 Puckering of macula, left eye: Secondary | ICD-10-CM

## 2022-07-16 ENCOUNTER — Encounter (INDEPENDENT_AMBULATORY_CARE_PROVIDER_SITE_OTHER): Payer: Medicare HMO | Admitting: Ophthalmology

## 2022-07-17 ENCOUNTER — Inpatient Hospital Stay (HOSPITAL_COMMUNITY)
Admission: EM | Admit: 2022-07-17 | Discharge: 2022-07-23 | DRG: 872 | Disposition: A | Discharge status: Skilled Nursing Facility | Payer: Medicare HMO | Attending: Internal Medicine | Admitting: Internal Medicine

## 2022-07-17 ENCOUNTER — Encounter (HOSPITAL_COMMUNITY): Payer: Self-pay

## 2022-07-17 ENCOUNTER — Other Ambulatory Visit: Payer: Self-pay

## 2022-07-17 ENCOUNTER — Emergency Department (HOSPITAL_COMMUNITY): Payer: Medicare HMO

## 2022-07-17 DIAGNOSIS — R17 Unspecified jaundice: Secondary | ICD-10-CM | POA: Diagnosis present

## 2022-07-17 DIAGNOSIS — E441 Mild protein-calorie malnutrition: Secondary | ICD-10-CM

## 2022-07-17 DIAGNOSIS — H109 Unspecified conjunctivitis: Secondary | ICD-10-CM | POA: Diagnosis present

## 2022-07-17 DIAGNOSIS — M549 Dorsalgia, unspecified: Secondary | ICD-10-CM | POA: Diagnosis present

## 2022-07-17 DIAGNOSIS — Z79899 Other long term (current) drug therapy: Secondary | ICD-10-CM

## 2022-07-17 DIAGNOSIS — I10 Essential (primary) hypertension: Secondary | ICD-10-CM | POA: Diagnosis not present

## 2022-07-17 DIAGNOSIS — R823 Hemoglobinuria: Secondary | ICD-10-CM | POA: Diagnosis present

## 2022-07-17 DIAGNOSIS — E440 Moderate protein-calorie malnutrition: Secondary | ICD-10-CM | POA: Diagnosis present

## 2022-07-17 DIAGNOSIS — I119 Hypertensive heart disease without heart failure: Secondary | ICD-10-CM | POA: Diagnosis present

## 2022-07-17 DIAGNOSIS — L03116 Cellulitis of left lower limb: Secondary | ICD-10-CM | POA: Diagnosis present

## 2022-07-17 DIAGNOSIS — D696 Thrombocytopenia, unspecified: Secondary | ICD-10-CM | POA: Diagnosis present

## 2022-07-17 DIAGNOSIS — R519 Headache, unspecified: Secondary | ICD-10-CM | POA: Diagnosis not present

## 2022-07-17 DIAGNOSIS — I482 Chronic atrial fibrillation, unspecified: Secondary | ICD-10-CM | POA: Diagnosis present

## 2022-07-17 DIAGNOSIS — E876 Hypokalemia: Secondary | ICD-10-CM | POA: Diagnosis present

## 2022-07-17 DIAGNOSIS — Z87891 Personal history of nicotine dependence: Secondary | ICD-10-CM

## 2022-07-17 DIAGNOSIS — Z96653 Presence of artificial knee joint, bilateral: Secondary | ICD-10-CM | POA: Diagnosis present

## 2022-07-17 DIAGNOSIS — Z8249 Family history of ischemic heart disease and other diseases of the circulatory system: Secondary | ICD-10-CM | POA: Diagnosis not present

## 2022-07-17 DIAGNOSIS — Z20822 Contact with and (suspected) exposure to covid-19: Secondary | ICD-10-CM | POA: Diagnosis present

## 2022-07-17 DIAGNOSIS — R4182 Altered mental status, unspecified: Secondary | ICD-10-CM | POA: Diagnosis present

## 2022-07-17 DIAGNOSIS — D649 Anemia, unspecified: Secondary | ICD-10-CM | POA: Diagnosis present

## 2022-07-17 DIAGNOSIS — M1990 Unspecified osteoarthritis, unspecified site: Secondary | ICD-10-CM | POA: Diagnosis present

## 2022-07-17 DIAGNOSIS — L0390 Cellulitis, unspecified: Secondary | ICD-10-CM | POA: Diagnosis not present

## 2022-07-17 DIAGNOSIS — A419 Sepsis, unspecified organism: Principal | ICD-10-CM | POA: Diagnosis present

## 2022-07-17 DIAGNOSIS — E663 Overweight: Secondary | ICD-10-CM | POA: Diagnosis present

## 2022-07-17 DIAGNOSIS — Z880 Allergy status to penicillin: Secondary | ICD-10-CM | POA: Diagnosis not present

## 2022-07-17 DIAGNOSIS — H409 Unspecified glaucoma: Secondary | ICD-10-CM | POA: Diagnosis present

## 2022-07-17 DIAGNOSIS — Z7901 Long term (current) use of anticoagulants: Secondary | ICD-10-CM | POA: Diagnosis not present

## 2022-07-17 DIAGNOSIS — Z6827 Body mass index (BMI) 27.0-27.9, adult: Secondary | ICD-10-CM

## 2022-07-17 DIAGNOSIS — E8809 Other disorders of plasma-protein metabolism, not elsewhere classified: Secondary | ICD-10-CM | POA: Diagnosis present

## 2022-07-17 DIAGNOSIS — G8929 Other chronic pain: Secondary | ICD-10-CM | POA: Diagnosis present

## 2022-07-17 LAB — URINALYSIS, ROUTINE W REFLEX MICROSCOPIC
Bacteria, UA: NONE SEEN
Bilirubin Urine: NEGATIVE
Glucose, UA: 50 mg/dL — AB
Ketones, ur: NEGATIVE mg/dL
Leukocytes,Ua: NEGATIVE
Nitrite: NEGATIVE
Protein, ur: 30 mg/dL — AB
Specific Gravity, Urine: 1.018 (ref 1.005–1.030)
pH: 6 (ref 5.0–8.0)

## 2022-07-17 LAB — COMPREHENSIVE METABOLIC PANEL
ALT: 38 U/L (ref 0–44)
AST: 34 U/L (ref 15–41)
Albumin: 3.3 g/dL — ABNORMAL LOW (ref 3.5–5.0)
Alkaline Phosphatase: 67 U/L (ref 38–126)
Anion gap: 8 (ref 5–15)
BUN: 19 mg/dL (ref 8–23)
CO2: 25 mmol/L (ref 22–32)
Calcium: 8.1 mg/dL — ABNORMAL LOW (ref 8.9–10.3)
Chloride: 110 mmol/L (ref 98–111)
Creatinine, Ser: 0.87 mg/dL (ref 0.61–1.24)
GFR, Estimated: >60 mL/min (ref >60)
Glucose, Bld: 160 mg/dL — ABNORMAL HIGH (ref 70–99)
Potassium: 2.5 mmol/L — CL (ref 3.5–5.1)
Sodium: 143 mmol/L (ref 135–145)
Total Bilirubin: 1.4 mg/dL — ABNORMAL HIGH (ref 0.3–1.2)
Total Protein: 6.3 g/dL — ABNORMAL LOW (ref 6.5–8.1)

## 2022-07-17 LAB — LACTIC ACID, PLASMA
Lactic Acid, Venous: 1.3 mmol/L (ref 0.5–1.9)
Lactic Acid, Venous: 1.5 mmol/L (ref 0.5–1.9)

## 2022-07-17 LAB — CBC WITH DIFFERENTIAL/PLATELET
Abs Immature Granulocytes: 0.08 10*3/uL — ABNORMAL HIGH (ref 0.00–0.07)
Basophils Absolute: 0 10*3/uL (ref 0.0–0.1)
Basophils Relative: 0 %
Eosinophils Absolute: 0 10*3/uL (ref 0.0–0.5)
Eosinophils Relative: 0 %
HCT: 39.6 % (ref 39.0–52.0)
Hemoglobin: 13.7 g/dL (ref 13.0–17.0)
Immature Granulocytes: 1 %
Lymphocytes Relative: 9 %
Lymphs Abs: 1.2 10*3/uL (ref 0.7–4.0)
MCH: 30.7 pg (ref 26.0–34.0)
MCHC: 34.6 g/dL (ref 30.0–36.0)
MCV: 88.8 fL (ref 80.0–100.0)
Monocytes Absolute: 0.9 10*3/uL (ref 0.1–1.0)
Monocytes Relative: 7 %
Neutro Abs: 11.1 10*3/uL — ABNORMAL HIGH (ref 1.7–7.7)
Neutrophils Relative %: 83 %
Platelets: 161 10*3/uL (ref 150–400)
RBC: 4.46 MIL/uL (ref 4.22–5.81)
RDW: 13.9 % (ref 11.5–15.5)
WBC: 13.3 10*3/uL — ABNORMAL HIGH (ref 4.0–10.5)
nRBC: 0 % (ref 0.0–0.2)

## 2022-07-17 LAB — MAGNESIUM: Magnesium: 2 mg/dL (ref 1.7–2.4)

## 2022-07-17 LAB — RESP PANEL BY RT-PCR (FLU A&B, COVID) ARPGX2
Influenza A by PCR: NEGATIVE
Influenza B by PCR: NEGATIVE
SARS Coronavirus 2 by RT PCR: NEGATIVE

## 2022-07-17 LAB — PROTIME-INR
INR: 1.2 (ref 0.8–1.2)
Prothrombin Time: 14.8 seconds (ref 11.4–15.2)

## 2022-07-17 LAB — PHOSPHORUS: Phosphorus: 3.1 mg/dL (ref 2.5–4.6)

## 2022-07-17 LAB — APTT: aPTT: 24 seconds (ref 24–36)

## 2022-07-17 MED ORDER — ONDANSETRON HCL 4 MG/2ML IJ SOLN
4.0000 mg | Freq: Four times a day (QID) | INTRAMUSCULAR | Status: DC | PRN
  Administered 2022-07-20: 4 mg via INTRAVENOUS
  Filled 2022-07-17: qty 2

## 2022-07-17 MED ORDER — VANCOMYCIN HCL IN DEXTROSE 1-5 GM/200ML-% IV SOLN
1000.0000 mg | Freq: Once | INTRAVENOUS | Status: AC
  Administered 2022-07-17: 1000 mg via INTRAVENOUS
  Filled 2022-07-17: qty 200

## 2022-07-17 MED ORDER — LACTATED RINGERS IV SOLN: INTRAVENOUS | Status: DC

## 2022-07-17 MED ORDER — POTASSIUM CHLORIDE 20 MEQ PO PACK
40.0000 meq | PACK | Freq: Once | ORAL | Status: AC
  Administered 2022-07-17: 40 meq via ORAL
  Filled 2022-07-17: qty 2

## 2022-07-17 MED ORDER — ACETAMINOPHEN 325 MG PO TABS
650.0000 mg | ORAL_TABLET | Freq: Four times a day (QID) | ORAL | Status: DC | PRN
  Administered 2022-07-17 – 2022-07-22 (×11): 650 mg via ORAL
  Filled 2022-07-17 (×11): qty 2

## 2022-07-17 MED ORDER — VANCOMYCIN HCL 1750 MG/350ML IV SOLN
1750.0000 mg | INTRAVENOUS | Status: DC
  Administered 2022-07-18 – 2022-07-20 (×3): 1750 mg via INTRAVENOUS
  Filled 2022-07-17 (×3): qty 350

## 2022-07-17 MED ORDER — POTASSIUM CHLORIDE IN NACL 40-0.9 MEQ/L-% IV SOLN
INTRAVENOUS | Status: AC
  Filled 2022-07-17 (×2): qty 1000

## 2022-07-17 MED ORDER — POTASSIUM CHLORIDE CRYS ER 20 MEQ PO TBCR: 40.0000 meq | EXTENDED_RELEASE_TABLET | Freq: Once | ORAL | Status: DC

## 2022-07-17 MED ORDER — ONDANSETRON HCL 4 MG PO TABS: 4.0000 mg | ORAL_TABLET | Freq: Four times a day (QID) | ORAL | Status: DC | PRN

## 2022-07-17 MED ORDER — LACTATED RINGERS IV BOLUS (SEPSIS)
1000.0000 mL | Freq: Once | INTRAVENOUS | Status: AC
  Administered 2022-07-17: 1000 mL via INTRAVENOUS

## 2022-07-17 MED ORDER — VANCOMYCIN HCL 750 MG/150ML IV SOLN
750.0000 mg | INTRAVENOUS | Status: DC
  Filled 2022-07-17 (×2): qty 150

## 2022-07-17 MED ORDER — LEVOFLOXACIN IN D5W 750 MG/150ML IV SOLN
750.0000 mg | Freq: Once | INTRAVENOUS | Status: AC
Start: 2022-07-17 — End: 2022-07-17
  Administered 2022-07-17: 750 mg via INTRAVENOUS
  Filled 2022-07-17: qty 150

## 2022-07-17 MED ORDER — APIXABAN 5 MG PO TABS
5.0000 mg | ORAL_TABLET | Freq: Two times a day (BID) | ORAL | Status: DC
  Administered 2022-07-17 – 2022-07-23 (×12): 5 mg via ORAL
  Filled 2022-07-17 (×12): qty 1

## 2022-07-17 MED ORDER — ACETAMINOPHEN 650 MG RE SUPP: 650.0000 mg | Freq: Four times a day (QID) | RECTAL | Status: DC | PRN

## 2022-07-17 MED ORDER — IRBESARTAN 150 MG PO TABS
150.0000 mg | ORAL_TABLET | Freq: Every day | ORAL | Status: DC
Start: 2022-07-17 — End: 2022-07-23
  Administered 2022-07-17 – 2022-07-23 (×7): 150 mg via ORAL
  Filled 2022-07-17 (×7): qty 1

## 2022-07-17 MED ORDER — ACETAMINOPHEN 325 MG PO TABS
650.0000 mg | ORAL_TABLET | Freq: Once | ORAL | Status: AC
  Administered 2022-07-17: 650 mg via ORAL
  Filled 2022-07-17: qty 2

## 2022-07-17 MED ORDER — VANCOMYCIN HCL 750 MG/150ML IV SOLN
750.0000 mg | INTRAVENOUS | Status: AC
  Administered 2022-07-17: 750 mg via INTRAVENOUS
  Filled 2022-07-17: qty 150

## 2022-07-17 MED ORDER — LEVOFLOXACIN IN D5W 500 MG/100ML IV SOLN
500.0000 mg | INTRAVENOUS | Status: DC
Start: 2022-07-18 — End: 2022-07-18
  Administered 2022-07-18: 500 mg via INTRAVENOUS
  Filled 2022-07-17: qty 100

## 2022-07-17 MED ORDER — POTASSIUM CHLORIDE 10 MEQ/100ML IV SOLN
10.0000 meq | INTRAVENOUS | Status: AC
  Administered 2022-07-17 (×2): 10 meq via INTRAVENOUS
  Filled 2022-07-17 (×2): qty 100

## 2022-07-17 NOTE — Progress Notes (Signed)
Pharmacy Antibiotic Note  Mathew Simpson is a 86 y.o. male admitted on 07/17/2022 with sepsis, cellulits. Pharmacy has been consulted for Vanco dosing.  Active Problem(s): AMS and left foot swelling and redness x 7d - has not taken meds for 2-3 days per ED notes. Pt is on eliquis  PMH: h/o septic joint, DDD, osteopenia, OA, HTN, HLD, Vit D def, LVH, carotid stenosis, glaucoma, h/o tobacco, afib  ID: Sepsis, cellulitis. Tmax 100.7. WBC 13.3. Scr 0.87.  7/27 LVQ>> 7/27 vanc>>   Plan: Levaquin '500mg'$  IV q24h Vancomycin 1750 mg IV Q 24 hrs. Goal AUC 400-550. Expected AUC: 481 SCr used: 0.87    Height: '5\' 10"'$  (177.8 cm) Weight: 86.2 kg (190 lb) IBW/kg (Calculated) : 73  Temp (24hrs), Avg:98.6 F (37 C), Min:97.3 F (36.3 C), Max:100.7 F (38.2 C)  Recent Labs  Lab 07/17/22 1114  WBC 13.3*  CREATININE 0.87  LATICACIDVEN 1.3    Estimated Creatinine Clearance: 65.3 mL/min (by C-G formula based on SCr of 0.87 mg/dL).    Allergies  Allergen Reactions   Penicillins Anaphylaxis    Did it involve swelling of the face/tongue/throat, SOB, or low BP? Yes Did it involve sudden or severe rash/hives, skin peeling, or any reaction on the inside of your mouth or nose? Unknown Did you need to seek medical attention at a hospital or doctor's office? Yes When did it last happen?      50+ years If all above answers are "NO", may proceed with cephalosporin use.     Mathew Simpson S. Alford Highland, PharmD, BCPS Clinical Staff Pharmacist [email or web address]  Wayland Salinas 07/17/2022 1:50 PM

## 2022-07-17 NOTE — Progress Notes (Signed)
A consult was received from an ED physician for Cellulitis per pharmacy dosing.  The patient's profile has been reviewed for ht/wt/allergies/indication/available labs.   A one time order has been placed for Vanco, Levaquin.  Further antibiotics/pharmacy consults should be ordered by admitting physician if indicated.                       Kaiana Marion S. Alford Highland, PharmD, BCPS Clinical Staff Pharmacist [email or web address]  Thank you, Wayland Salinas 07/17/2022  11:39 AM

## 2022-07-17 NOTE — ED Provider Notes (Signed)
[facility] DEPT Provider Note   CSN: [id number] Arrival date & time: 07/17/22  1048     History  Chief Complaint  Patient presents with   Altered Mental Status    Mathew Simpson is a 86 y.o. male.  Patient here with confusion, change in mental status.  Lives by himself.  Family member came over and did not think he was acting right.  Has had swelling and redness to his left foot for the last several days.  Patient denies any pain but just feels weak.  Noticed left foot swelling and redness last several days.  History of left knee replacement that got infected in the past.  Denies any weakness, numbness, chest pain, cough, sputum production.  No pain with urination.  History of hypertension.  History of A-fib on blood thinner.  The history is provided by the patient and the EMS personnel.       Home Medications Prior to Admission medications   Medication Sig Start Date End Date Taking? Authorizing Provider  apixaban (ELIQUIS) 5 MG TABS tablet Take 5 mg by mouth 2 (two) times daily.   Yes [provider]  Calcium Citrate-Vitamin D (CALCIUM + D PO) Take 1 tablet by mouth 2 (two) times daily.   Yes [provider]  Cholecalciferol (DIALYVITE VITAMIN D 5000) 125 MCG (5000 UT) capsule Take 5,000 Units by mouth every evening.    Yes [provider]  HYDROcodone-acetaminophen (NORCO) 10-325 MG tablet Take 1 tablet by mouth 4 (four) times daily. 07/02/22  Yes [provider]  irbesartan (AVAPRO) 150 MG tablet Take 150 mg by mouth daily. 07/14/22  Yes [provider]  Multiple Vitamin (MULTIVITAMIN WITH MINERALS) TABS tablet Take 1 tablet by mouth daily at 2 PM.    Yes [provider]  Multiple Vitamins-Minerals (PRESERVISION AREDS 2 PO) Take 1 tablet by mouth in the morning and at bedtime.   Yes [provider]  TIADYLT ER 180 MG 24 hr capsule Take 180 mg by mouth daily. 05/05/22  Yes [provider]      Allergies    Penicillins    Review of Systems   Review of Systems  Physical Exam Updated Vital Signs BP (!) 156/104   Pulse 84   Temp (!) 100.7 F (38.2 C) (Rectal)   Resp (!) 22   Ht '5\' 10"'$  (1.778 m)   Wt 86.2 kg   SpO2 99%   BMI 27.26 kg/m  Physical Exam Vitals and nursing note reviewed.  Constitutional:      General: He is not in acute distress.    Appearance: He is well-developed.  HENT:     Head: Normocephalic and atraumatic.     Nose: Nose normal.  Eyes:     Extraocular Movements: Extraocular movements intact.     Conjunctiva/sclera: Conjunctivae normal.     Pupils: Pupils are equal, round, and reactive to light.  Cardiovascular:     Rate and Rhythm: Normal rate and regular rhythm.     Heart sounds: No murmur heard. Pulmonary:     Effort: Pulmonary effort is normal. No respiratory distress.     Breath sounds: Normal breath sounds.  Abdominal:     Palpations: Abdomen is soft.     Tenderness: There is no abdominal tenderness.  Musculoskeletal:        General: No swelling.     Cervical back: Neck supple.  Skin:    General: Skin is warm and dry.  Capillary Refill: Capillary refill takes less than 2 seconds.     Findings: Erythema present.     Comments: Is got redness and swelling of the top of his left foot extending up into the left ankle and shin  Neurological:     General: No focal deficit present.     Mental Status: He is alert.     Comments: 5+ out of 5 strength throughout, normal sensation, mildly confused  Psychiatric:        Mood and Affect: Mood normal.     ED Results / Procedures / Treatments   Labs (all labs ordered are listed, but only abnormal results are displayed) Labs Reviewed  COMPREHENSIVE METABOLIC PANEL - Abnormal; Notable for the following components:      Result Value   Potassium 2.5 (*)    Glucose, Bld 160 (*)    Calcium 8.1 (*)    Total Protein 6.3 (*)    Albumin 3.3 (*)    Total Bilirubin 1.4 (*)     All other components within normal limits  CBC WITH DIFFERENTIAL/PLATELET - Abnormal; Notable for the following components:   WBC 13.3 (*)    Neutro Abs 11.1 (*)    Abs Immature Granulocytes 0.08 (*)    All other components within normal limits  RESP PANEL BY RT-PCR (FLU A&B, COVID) ARPGX2  CULTURE, BLOOD (ROUTINE X 2)  CULTURE, BLOOD (ROUTINE X 2)  URINE CULTURE  LACTIC ACID, PLASMA  PROTIME-INR  APTT  LACTIC ACID, PLASMA  URINALYSIS, ROUTINE W REFLEX MICROSCOPIC    EKG None  Radiology DG Foot Complete Left  Result Date: 07/17/2022 CLINICAL DATA:  Altered mental status and left foot swelling and redness EXAM: LEFT FOOT - COMPLETE 3+ VIEW COMPARISON:  None Available. FINDINGS: No fracture or dislocation. No radiographic evidence of osteomyelitis. Scattered degenerative arthritis greatest at the first MTP joint where it is advanced. Os peroneum. IMPRESSION: No radiographic evidence of osteomyelitis. Electronically Signed   By: Placido Sou M.D.   On: 07/17/2022 12:19   DG Chest Port 1 View  Result Date: 07/17/2022 CLINICAL DATA:  Possible sepsis EXAM: PORTABLE CHEST 1 VIEW COMPARISON:  09/15/2019 FINDINGS: Transverse diameter heart is increased. There are no signs of pulmonary edema or focal pulmonary consolidation. There is no pleural effusion or pneumothorax. Degenerative changes are noted in right shoulder. IMPRESSION: Cardiomegaly. There are no signs of pulmonary edema or focal pulmonary consolidation. Electronically Signed   By: Elmer Picker M.D.   On: 07/17/2022 12:18    Procedures .Critical Care  Performed by: Lennice Sites, DO Authorized by: Lennice Sites, DO   Critical care provider statement:    Critical care time (minutes):  35   Critical care was necessary to treat or prevent imminent or life-threatening deterioration of the following conditions:  Sepsis   Critical care was time spent personally by me on the following activities:  Blood draw for  specimens, development of treatment plan with patient or surrogate, discussions with primary provider, obtaining history from patient or surrogate, ordering and performing treatments and interventions, evaluation of patient's response to treatment, ordering and review of laboratory studies, ordering and review of radiographic studies, pulse oximetry, re-evaluation of patient's condition and review of old charts   Care discussed with: admitting provider       Medications Ordered in ED Medications  lactated ringers infusion ( Intravenous New Bag/Given 07/17/22 1200)  lactated ringers bolus 1,000 mL (1,000 mLs Intravenous New Bag/Given 07/17/22 1200)  vancomycin (VANCOCIN) IVPB 1000  mg/200 mL premix (1,000 mg Intravenous New Bag/Given 07/17/22 1237)  levofloxacin (LEVAQUIN) IVPB 750 mg (750 mg Intravenous New Bag/Given 07/17/22 1203)  potassium chloride 10 mEq in 100 mL IVPB (10 mEq Intravenous New Bag/Given 07/17/22 1232)  acetaminophen (TYLENOL) tablet 650 mg (650 mg Oral Given 07/17/22 1203)  potassium chloride (KLOR-CON) packet 40 mEq (40 mEq Oral Given 07/17/22 1227)    ED Course/ Medical Decision Making/ A&P                           Medical Decision Making Amount and/or Complexity of Data Reviewed Labs: ordered. Radiology: ordered. ECG/medicine tests: ordered.  Risk OTC drugs. Prescription drug management. Decision regarding hospitalization.   Mathew Simpson is here with altered mental status, redness to his left foot.  Patient febrile upon arrival.  Heart rate in the 90s.  Code sepsis initiated.  My suspicion is for left foot/leg cellulitis.  He has redness and swelling to his left forefoot extending up into the left shin.  History of left knee replacement with septic joint in the past.  He is not having any pain or swelling in the left knee.  He is mildly confused on exam which I think is secondary to fever.  Has not had any other cough or sputum production or pain with urination or  abdominal pain.  We will start IV antibiotics to cover for cellulitis.  We will get blood cultures, lactic acid, chest x-ray, foot x-ray.  He has good pulses on exam especially in the lower extremities.  Anticipate admission given confusion and foot infection and concern for sepsis.  Blood pressure is normal.  We will start with 1 L of LR bolus.  Per my review and interpretation of EKG sinus rhythm.  No ischemic changes.  Leukocytosis of 13 per my review and interpretation of labs.  Lactic acid is normal.  Potassium low at 2.5.  Creatinine however is normal.  X-ray of his left foot showed no signs of osteomyelitis or air.  Chest x-ray per my review and interpretation shows no evidence of pneumonia.  Overall suspect patient with sepsis secondary to cellulitis in his left foot.  Given his confusion and weakness and electrolyte abnormalities believe he benefit from further IV antibiotics and admission.  Admitted to hospitalist.  This chart was dictated using voice recognition software.  Despite best efforts to proofread,  errors can occur which can change the documentation meaning.         Final Clinical Impression(s) / ED Diagnoses Final diagnoses:  Sepsis, due to unspecified organism, unspecified whether acute organ dysfunction present [facility])  Cellulitis, unspecified cellulitis site    Rx / DC Orders ED Discharge Orders     None         Lennice Sites, DO 07/17/22 1245

## 2022-07-17 NOTE — H&P (Signed)
History and Physical    Patient: Mathew Simpson DOB: February 04, 1936 DOA: 07/17/2022 DOS: the patient was seen and examined on 07/17/2022 PCP: Secundino Ginger, PA-C  Patient coming from: Home  Chief Complaint:  Chief Complaint  Patient presents with   Altered Mental Status   HPI: Mathew Simpson is a 86 y.o. male with medical history significant of osteoarthritis, chronic atrial fibrillation, chronic back pain, glaucoma, hypertension, overweight who is brought to the emergency department due to decreased oral intake, fatigue, malaise associated with left lower extremity tenderness, edema and erythema.  He was febrile upon arrival.  He has been having a headache. He denied rhinorrhea, sore throat, wheezing or hemoptysis.  No chest pain, palpitations, diaphoresis, PND, orthopnea or pitting edema of the lower extremities.  No abdominal pain, nausea, emesis, diarrhea, constipation, melena or hematochezia.  No flank pain, dysuria, frequency or hematuria.  No polyuria, polydipsia, polyphagia or blurred vision.   ED course: Initial vital signs were temperature 100.7 F, pulse 86, respiration 24, BP 145/93 and O2 sat 97% on room air.  The patient received 1000 mL of LR bolus, acetaminophen 650 mg p.o. x1, KCl 40 mEq p.o., L 10 mEq IVPB x2, vancomycin and IV levofloxacin.  Lab work: His urinalysis show glucosuria 50 and proteinuria 30 mg/dL.  There was moderate hemoglobinuria but only 6-10 RBC per hpf on microscopic examination.  CBC showed a white count of 13.3, hemoglobin 13.7 g/dL platelets 161.  Normal PT, INR and PTT.  Lactic acid so far normal.  CMP showed a potassium of 2.5 mmol/L, but the rest of the electrolytes are normal when calcium is corrected.  Unremarkable renal function.  LFTs show total protein of 6.3 and albumin of 3.3 g/dL.  Alk phos and transaminases were normal.  Total bilirubin was 1.4 mg/dL.  Imaging: Two-view chest radiograph showed cardiomegaly without signs of pulmonary  edema or consolidation.  Left foot x-ray with no radiographic evidence of osteomyelitis.   Review of Systems: As mentioned in the history of present illness. All other systems reviewed and are negative.  Past Medical History:  Diagnosis Date   Arthritis    back,   DJD (degenerative joint disease)    Dysrhythmia    A-fib   Glaucoma    Hypertension    Past Surgical History:  Procedure Laterality Date   EYE SURGERY     bil    JOINT REPLACEMENT     knee surgery     arthroscopic   TOTAL KNEE ARTHROPLASTY Left 09/12/2019   Procedure: TOTAL KNEE ARTHROPLASTY;  Surgeon: Gaynelle Arabian, MD;  Location: [facility] ORS;  Service: Orthopedics;  Laterality: Left;  30mn   TOTAL KNEE ARTHROPLASTY Right 03/12/2020   Procedure: TOTAL KNEE ARTHROPLASTY;  Surgeon: AGaynelle Arabian MD;  Location: [facility] ORS;  Service: Orthopedics;  Laterality: Right;  570m   Social History:  reports that he has quit smoking. His smoking use included cigarettes. He has a 20.00 pack-year smoking history. He has never used smokeless tobacco. He reports that he does not currently use alcohol. He reports that he does not currently use drugs.  Allergies  Allergen Reactions   Penicillins Anaphylaxis    Did it involve swelling of the face/tongue/throat, SOB, or low BP? Yes Did it involve sudden or severe rash/hives, skin peeling, or any reaction on the inside of your mouth or nose? Unknown Did you need to seek medical attention at a hospital or doctor's office? Yes When did it last happen?  50+ years If all above answers are "NO", may proceed with cephalosporin use.     Family History  Problem Relation Age of Onset   Hypertension Other     Prior to Admission medications   Medication Sig Start Date End Date Taking? Authorizing Provider  apixaban (ELIQUIS) 5 MG TABS tablet Take 5 mg by mouth 2 (two) times daily.   Yes [provider]  Calcium Citrate-Vitamin D (CALCIUM + D PO) Take 1 tablet by mouth 2 (two) times  daily.   Yes [provider]  Cholecalciferol (DIALYVITE VITAMIN D 5000) 125 MCG (5000 UT) capsule Take 5,000 Units by mouth every evening.    Yes [provider]  HYDROcodone-acetaminophen (NORCO) 10-325 MG tablet Take 1 tablet by mouth 4 (four) times daily. 07/02/22  Yes [provider]  irbesartan (AVAPRO) 150 MG tablet Take 150 mg by mouth daily. 07/14/22  Yes [provider]  Multiple Vitamin (MULTIVITAMIN WITH MINERALS) TABS tablet Take 1 tablet by mouth daily at 2 PM.    Yes [provider]  Multiple Vitamins-Minerals (PRESERVISION AREDS 2 PO) Take 1 tablet by mouth in the morning and at bedtime.   Yes [provider]  TIADYLT ER 180 MG 24 hr capsule Take 180 mg by mouth daily. 05/05/22  Yes [provider]    Physical Exam: Vitals:   07/17/22 1238 07/17/22 1245 07/17/22 1330 07/17/22 1332  BP:  (!) 157/98 (!) 141/88   Pulse: 84 79 74   Resp: (!) 22 20 (!) 25   Temp:    97.8 F (36.6 C)  TempSrc:    Oral  SpO2: 99% 98% 98%   Weight:      Height:       Physical Exam Vitals and nursing note reviewed.  Constitutional:      General: He is awake.     Appearance: Normal appearance.  HENT:     Head: Normocephalic.     Mouth/Throat:     Mouth: Mucous membranes are moist.  Eyes:     General: No scleral icterus.    Pupils: Pupils are equal, round, and reactive to light.  Neck:     Vascular: No JVD.  Cardiovascular:     Rate and Rhythm: Normal rate. Rhythm irregularly irregular.     Heart sounds: S1 normal and S2 normal.  Pulmonary:     Effort: Pulmonary effort is normal. No respiratory distress.     Breath sounds: Normal breath sounds. No wheezing or rales.  Abdominal:     General: Bowel sounds are normal. There is no distension.     Palpations: Abdomen is soft.     Tenderness: There is no abdominal tenderness. There is no right CVA tenderness, left CVA tenderness or guarding.  Musculoskeletal:     Cervical back:  Neck supple.     Right lower leg: No edema.     Left lower leg: No edema.  Skin:    General: Skin is warm and dry.     Findings: Erythema present.     Comments: Positive for erythema, edema, calor and TTP on left foot and pretibial area.  Neurological:     General: No focal deficit present.     Mental Status: He is alert and oriented to person, place, and time.  Psychiatric:        Mood and Affect: Mood normal.        Behavior: Behavior normal. Behavior is cooperative.   Data Reviewed:  There are no  new results to review at this time.  Assessment and Plan: Principal Problem:   Cellulitis of left lower extremity   Cellulitis of left foot Meeting criteria for:   Sepsis due to cellulitis ([place]) Admit to telemetry/inpatient. Continue IV fluids. Continue Levaquin per pharmacy. Continue vancomycin per pharmacy. Follow-up blood culture and sensitivity Follow CBC and CMP in a.m.  Active Problems:   Hypokalemia Supplementing. Follow-up potassium level in the morning. Supplement magnesium despite normal mg level if no improvement.    Essential hypertension Resume Avapro 150 mg IVP in AM.    Chronic atrial fibrillation (HCC) CHA2DS2-VASc Score of at least 5.. Continue apixaban 5 mg p.o. twice daily. Currently on no rate control meds. Optimize electrolytes.    Mild protein malnutrition (HCC) Protein supplementation.    Hyperbilirubinemia Recheck bilirubin in AM.      Advance Care Planning:   Code Status: Full Code   Consults:   Family Communication:   Severity of Illness: The appropriate patient status for this patient is INPATIENT. Inpatient status is judged to be reasonable and necessary in order to provide the required intensity of service to ensure the patient's safety. The patient's presenting symptoms, physical exam findings, and initial radiographic and laboratory data in the context of their chronic comorbidities is felt to place them at high risk for further  clinical deterioration. Furthermore, it is not anticipated that the patient will be medically stable for discharge from the hospital within 2 midnights of admission.   * I certify that at the point of admission it is my clinical judgment that the patient will require inpatient hospital care spanning beyond 2 midnights from the point of admission due to high intensity of service, high risk for further deterioration and high frequency of surveillance required.*  Author: Reubin Milan, MD 07/17/2022 4:11 PM  For on call review [email or web address].   This document was prepared using Dragon voice recognition software and may contain some unintended transcription errors.

## 2022-07-17 NOTE — ED Triage Notes (Signed)
Pt coming from home via EMS with c/o AMS and left foot swelling and redness. Unsure how long these s/s have been going on. [place] daughter reports that he was "not all with it yesterday." Pt reports he has not taken meds for 2-3 days. Pt is on eliquis. Pt ambulatory and A&Ox4 at baseline. Pt currently A&Ox3. Per EMS, rales heard in bilateral lower lobes. Pt denies any falls or pain but does states that he "just feels lousy."

## 2022-07-17 NOTE — Sepsis Progress Note (Signed)
Sepsis protocol monitored by eLink 

## 2022-07-18 DIAGNOSIS — L03116 Cellulitis of left lower limb: Secondary | ICD-10-CM | POA: Diagnosis not present

## 2022-07-18 DIAGNOSIS — I482 Chronic atrial fibrillation, unspecified: Secondary | ICD-10-CM

## 2022-07-18 DIAGNOSIS — A419 Sepsis, unspecified organism: Secondary | ICD-10-CM | POA: Diagnosis not present

## 2022-07-18 DIAGNOSIS — E440 Moderate protein-calorie malnutrition: Secondary | ICD-10-CM

## 2022-07-18 DIAGNOSIS — L0390 Cellulitis, unspecified: Secondary | ICD-10-CM | POA: Diagnosis not present

## 2022-07-18 DIAGNOSIS — E876 Hypokalemia: Secondary | ICD-10-CM

## 2022-07-18 LAB — CBC
HCT: 35.2 % — ABNORMAL LOW (ref 39.0–52.0)
Hemoglobin: 12.1 g/dL — ABNORMAL LOW (ref 13.0–17.0)
MCH: 30.9 pg (ref 26.0–34.0)
MCHC: 34.4 g/dL (ref 30.0–36.0)
MCV: 89.8 fL (ref 80.0–100.0)
Platelets: 143 10*3/uL — ABNORMAL LOW (ref 150–400)
RBC: 3.92 MIL/uL — ABNORMAL LOW (ref 4.22–5.81)
RDW: 14.1 % (ref 11.5–15.5)
WBC: 12.6 10*3/uL — ABNORMAL HIGH (ref 4.0–10.5)
nRBC: 0 % (ref 0.0–0.2)

## 2022-07-18 LAB — COMPREHENSIVE METABOLIC PANEL
ALT: 30 U/L (ref 0–44)
AST: 25 U/L (ref 15–41)
Albumin: 2.8 g/dL — ABNORMAL LOW (ref 3.5–5.0)
Alkaline Phosphatase: 63 U/L (ref 38–126)
Anion gap: 6 (ref 5–15)
BUN: 14 mg/dL (ref 8–23)
CO2: 25 mmol/L (ref 22–32)
Calcium: 7.9 mg/dL — ABNORMAL LOW (ref 8.9–10.3)
Chloride: 112 mmol/L — ABNORMAL HIGH (ref 98–111)
Creatinine, Ser: 0.81 mg/dL (ref 0.61–1.24)
GFR, Estimated: >60 mL/min (ref >60)
Glucose, Bld: 153 mg/dL — ABNORMAL HIGH (ref 70–99)
Potassium: 3.5 mmol/L (ref 3.5–5.1)
Sodium: 143 mmol/L (ref 135–145)
Total Bilirubin: 1.4 mg/dL — ABNORMAL HIGH (ref 0.3–1.2)
Total Protein: 5.7 g/dL — ABNORMAL LOW (ref 6.5–8.1)

## 2022-07-18 MED ORDER — ADULT MULTIVITAMIN W/MINERALS CH
1.0000 | ORAL_TABLET | Freq: Every day | ORAL | Status: DC
  Administered 2022-07-18 – 2022-07-23 (×6): 1 via ORAL
  Filled 2022-07-18 (×6): qty 1

## 2022-07-18 MED ORDER — KETOROLAC TROMETHAMINE 0.5 % OP SOLN
1.0000 [drp] | Freq: Four times a day (QID) | OPHTHALMIC | Status: DC
  Administered 2022-07-18 – 2022-07-23 (×17): 1 [drp] via OPHTHALMIC
  Filled 2022-07-18: qty 5

## 2022-07-18 MED ORDER — CALCIUM CARBONATE ANTACID 500 MG PO CHEW
1.0000 | CHEWABLE_TABLET | Freq: Once | ORAL | Status: DC
Start: 2022-07-18 — End: 2022-07-23
  Filled 2022-07-18: qty 1

## 2022-07-18 MED ORDER — CIPROFLOXACIN HCL 0.3 % OP SOLN
1.0000 [drp] | OPHTHALMIC | Status: DC
Start: 2022-07-18 — End: 2022-07-23
  Administered 2022-07-18 – 2022-07-23 (×22): 1 [drp] via OPHTHALMIC
  Filled 2022-07-18: qty 2.5

## 2022-07-18 MED ORDER — ALUM & MAG HYDROXIDE-SIMETH 200-200-20 MG/5ML PO SUSP
30.0000 mL | ORAL | Status: DC | PRN
  Administered 2022-07-18 – 2022-07-20 (×2): 30 mL via ORAL
  Filled 2022-07-18 (×3): qty 30

## 2022-07-18 NOTE — TOC Initial Note (Signed)
Transition of Care [facility]) - Initial/Assessment Note    Patient Details  Name: Mathew Simpson MRN: [id number] Date of Birth: August 06, 1936  Transition of Care [facility]) CM/SW Contact:    Servando Snare, LCSW Phone Number: 07/18/2022, 1:49 PM  Clinical Narrative: LCSW met with patient at bedside. Patient is lethargic and unengaged. He reports being sleepy. Client reports he lives alone and is independent at baseline. He reports that he ahs a daughter that lives a couple miles away and other family that checks on him.   Discharge plan: unkown          Please consult TOC formally consult TOC if needs arise.    Expected Discharge Plan: Home/Self Care Barriers to Discharge: Continued Medical Work up   Patient Goals and CMS Choice Patient states their goals for this hospitalization and ongoing recovery are:: Go home      Expected Discharge Plan and Services Expected Discharge Plan: Home/Self Care In-house Referral: NA Discharge Planning Services: NA   Living arrangements for the past 2 months: Single Family Home                                      Prior Living Arrangements/Services Living arrangements for the past 2 months: Single Family Home Lives with:: Self Patient language and need for interpreter reviewed:: Yes Do you feel safe going back to the place where you live?: Yes      Need for Family Participation in Patient Care: Yes (Comment) Care giver support system in place?: Yes (comment)   Criminal Activity/Legal Involvement Pertinent to Current Situation/Hospitalization: No - Comment as needed  Activities of Daily Living Home Assistive Devices/Equipment: Eyeglasses ADL Screening (condition at time of admission) Patient's cognitive ability adequate to safely complete daily activities?: No Is the patient deaf or have difficulty hearing?: Yes Does the patient have difficulty seeing, even when wearing glasses/contacts?: No Does the patient have difficulty concentrating,  remembering, or making decisions?: Yes (for the last few days) Patient able to express need for assistance with ADLs?: Yes Does the patient have difficulty dressing or bathing?: No Independently performs ADLs?: No Communication: Independent Dressing (OT): Independent Grooming: Independent Feeding: Independent Bathing: Independent Toileting: Needs assistance Is this a change from baseline?: Change from baseline, expected to last >3days In/Out Bed: Needs assistance Is this a change from baseline?: Change from baseline, expected to last >3 days Walks in Home: Needs assistance Is this a change from baseline?: Change from baseline, expected to last >3 days Does the patient have difficulty walking or climbing stairs?: Yes Weakness of Legs: Both Weakness of Arms/Hands: Both  Permission Sought/Granted Permission sought to share information with : Family Supports Permission granted to share information with : Yes, Verbal Permission Granted  Share Information with NAME: Margarita Grizzle     Permission granted to share info w Relationship: Daughter     Emotional Assessment Appearance:: Appears stated age Attitude/Demeanor/Rapport: Lethargic Affect (typically observed): Flat Orientation: : Oriented to Self, Oriented to Place, Oriented to Situation Alcohol / Substance Use: Not Applicable Psych Involvement: No (comment)  Admission diagnosis:  Cellulitis of left foot [L03.116] Cellulitis, unspecified cellulitis site [L03.90] Sepsis, due to unspecified organism, unspecified whether acute organ dysfunction present [facility]) [A41.9] Patient Active Problem List   Diagnosis Date Noted   Cellulitis of left foot 07/17/2022   Sepsis due to cellulitis ([place]) 07/17/2022   Chronic atrial fibrillation ([place]) 07/17/2022   Hypokalemia 07/17/2022  Mild protein malnutrition ([place]) 07/17/2022   Hyperbilirubinemia 07/17/2022   Primary osteoarthritis of right knee 03/12/2020   Cellulitis of left lower extremity     Septic joint of left knee joint ([place]) 09/15/2019   Acute blood loss anemia 09/15/2019   Essential hypertension 09/15/2019   OA (osteoarthritis) of knee 09/12/2019   PCP:  Secundino Ginger, PA-C Pharmacy:   [id number] [address] [id number]: [phone]: [phone]    Social Determinants of Health (SDOH) Interventions    Readmission Risk Interventions     No data to display

## 2022-07-18 NOTE — Progress Notes (Signed)
Pharmacy Antibiotic Note  Mathew Simpson is a 86 y.o. male admitted on 07/17/2022 with sepsis, cellulits. Pharmacy has been consulted for Vanco dosing.  Active Problem(s): AMS and left foot swelling and redness x 7d - has not taken meds for 2-3 days per ED notes. Pt is on eliquis  PMH: h/o septic joint, DDD, osteopenia, OA, HTN, HLD, Vit D def, LVH, carotid stenosis, glaucoma, h/o tobacco, afib  ID: Sepsis, cellulitis.  7/27 LVQ>> 7/27 vanc>>   Plan: Levaquin '500mg'$  IV q24h Vancomycin 1750 mg IV Q 24 hrs. Goal AUC 400-550. Expected AUC: 481 SCr used: 0.87    Height: '5\' 10"'$  (177.8 cm) Weight: 86.2 kg (190 lb) IBW/kg (Calculated) : 73  Temp (24hrs), Avg:98.6 F (37 C), Min:98.2 F (36.8 C), Max:99.2 F (37.3 C)  Recent Labs  Lab 07/17/22 1114 07/17/22 1558 07/18/22 0529  WBC 13.3*  --  12.6*  CREATININE 0.87  --  0.81  LATICACIDVEN 1.3 1.5  --      Estimated Creatinine Clearance: 70.1 mL/min (by C-G formula based on SCr of 0.81 mg/dL).    Allergies  Allergen Reactions   Penicillins Anaphylaxis    Did it involve swelling of the face/tongue/throat, SOB, or low BP? Yes Did it involve sudden or severe rash/hives, skin peeling, or any reaction on the inside of your mouth or nose? Unknown Did you need to seek medical attention at a hospital or doctor's office? Yes When did it last happen?      50+ years If all above answers are "NO", may proceed with cephalosporin use.      [name] A 07/18/2022 2:07 PM

## 2022-07-18 NOTE — Hospital Course (Addendum)
HPI per Dr. Tennis Must on 07/17/22  HPI: Mathew Simpson is a 86 y.o. male with medical history significant of osteoarthritis, chronic atrial fibrillation, chronic back pain, glaucoma, hypertension, overweight who is brought to the emergency department due to decreased oral intake, fatigue, malaise associated with left lower extremity tenderness, edema and erythema.  He was febrile upon arrival.  He has been having a headache. He denied rhinorrhea, sore throat, wheezing or hemoptysis.  No chest pain, palpitations, diaphoresis, PND, orthopnea or pitting edema of the lower extremities.  No abdominal pain, nausea, emesis, diarrhea, constipation, melena or hematochezia.  No flank pain, dysuria, frequency or hematuria.  No polyuria, polydipsia, polyphagia or blurred vision.    ED course: Initial vital signs were temperature 100.7 F, pulse 86, respiration 24, BP 145/93 and O2 sat 97% on room air.  The patient received 1000 mL of LR bolus, acetaminophen 650 mg p.o. x1, KCl 40 mEq p.o., L 10 mEq IVPB x2, vancomycin and IV levofloxacin.   Lab work: His urinalysis show glucosuria 50 and proteinuria 30 mg/dL.  There was moderate hemoglobinuria but only 6-10 RBC per hpf on microscopic examination.  CBC showed a white count of 13.3, hemoglobin 13.7 g/dL platelets 161.  Normal PT, INR and PTT.  Lactic acid so far normal.  CMP showed a potassium of 2.5 mmol/L, but the rest of the electrolytes are normal when calcium is corrected.  Unremarkable renal function.  LFTs show total protein of 6.3 and albumin of 3.3 g/dL.  Alk phos and transaminases were normal.  Total bilirubin was 1.4 mg/dL.   Imaging: Two-view chest radiograph showed cardiomegaly without signs of pulmonary edema or consolidation.  Left foot x-ray with no radiographic evidence of osteomyelitis.  *Interim History Patient was placed on IV levofloxacin and IV vancomycin but will de-escalate and stop the IV levofloxacin.  Patient was also complaining about some  eye pain and some itching with some drainage so we will place him on oral antibiotics.  He is improving slowly but is a little somnolent and sleepy today.  Leg appears to be looking better.  PT OT recommending SNF

## 2022-07-18 NOTE — Progress Notes (Signed)
PROGRESS NOTE    Mathew Simpson  AGT:[id number] DOB: 11/24/1936 DOA: 07/17/2022 PCP: Secundino Ginger, PA-C   Brief Narrative:  We will floxacillin IV vancomycin but we will de-escalate and stop IV levofloxacin and discontinue vancomycin monotherapy.  Leg appears to be improving slowly.  Patient now complaining of someHPI per Dr. Tennis Must on 07/17/22  HPI: Mathew Simpson is a 86 y.o. male with medical history significant of osteoarthritis, chronic atrial fibrillation, chronic back pain, glaucoma, hypertension, overweight who is brought to the emergency department due to decreased oral intake, fatigue, malaise associated with left lower extremity tenderness, edema and erythema.  He was febrile upon arrival.  He has been having a headache. He denied rhinorrhea, sore throat, wheezing or hemoptysis.  No chest pain, palpitations, diaphoresis, PND, orthopnea or pitting edema of the lower extremities.  No abdominal pain, nausea, emesis, diarrhea, constipation, melena or hematochezia.  No flank pain, dysuria, frequency or hematuria.  No polyuria, polydipsia, polyphagia or blurred vision.    ED course: Initial vital signs were temperature 100.7 F, pulse 86, respiration 24, BP 145/93 and O2 sat 97% on room air.  The patient received 1000 mL of LR bolus, acetaminophen 650 mg p.o. x1, KCl 40 mEq p.o., L 10 mEq IVPB x2, vancomycin and IV levofloxacin.   Lab work: His urinalysis show glucosuria 50 and proteinuria 30 mg/dL.  There was moderate hemoglobinuria but only 6-10 RBC per hpf on microscopic examination.  CBC showed a white count of 13.3, hemoglobin 13.7 g/dL platelets 161.  Normal PT, INR and PTT.  Lactic acid so far normal.  CMP showed a potassium of 2.5 mmol/L, but the rest of the electrolytes are normal when calcium is corrected.  Unremarkable renal function.  LFTs show total protein of 6.3 and albumin of 3.3 g/dL.  Alk phos and transaminases were normal.  Total bilirubin was 1.4 mg/dL.   Imaging:  Two-view chest radiograph showed cardiomegaly without signs of pulmonary edema or consolidation.  Left foot x-ray with no radiographic evidence of osteomyelitis.  *Interim History Patient was placed on IV levofloxacin and IV vancomycin but will de-escalate and stop the IV levofloxacin.  Patient was also complaining about some eye pain and some itching with some drainage so we will place him on oral antibiotics.   Assessment and Plan:    Cellulitis of left lower extremity   Cellulitis of left foot Meeting criteria for:   Sepsis due to cellulitis ([place]) -Admit to telemetry/inpatient. -Patient met sepsis criteria admission as he is febrile and tachypneic with a source of infection -Continue IV fluids. -We will discontinue Levaquin and just continue vancomycin per pharmacy. -Follow-up blood culture and sensitivity -WBC went from 13.3 is now 12.6 and improving slowly -Follow CBC and CMP in a.m.   Hypokalemia -Supplementing. -Patient's potassium went from 2.5 is now 3.5 and improved -Continue to monitor and replete as necessary -Repeat CMP in a.m.   Essential hypertension -Resume Avapro 150 mg IVP in AM. -Continue to monitor blood pressures per protocol -Last blood pressure reading was 148/96   Chronic atrial fibrillation (HCC) -CHA2DS2-VASc Score of at least 5.. -Continue apixaban 5 mg p.o. twice daily. -Currently on no rate control meds. -Optimize electrolytes.   Moderate protein malnutrition (HCC) Nutrition Status: Nutrition Problem: Moderate Malnutrition Etiology: acute illness, poor appetite (LLE cellulitis) Signs/Symptoms: mild fat depletion, mild muscle depletion Interventions: MVI, Liberalize Diet  Hyperbilirubinemia -Mild and patient's T. bili is again 1.4 Negative monitor trend and repeat CMP in a.m.  Normocytic anemia -Patient's hemoglobin/hematocrit went from 13.7/39.6 is now 12.1/35.2 -Check anemia panel in a.m. -Continue to monitor for signs and symptoms  bleeding; no overt bleeding noted -Repeat CBC in a.m.  Thrombocytopenia -Mild and patient's platelet count went from 161 is now 143 -Continue monitor trend and repeat CBC in a.m.  Hypoalbuminemia -Patient's albumin level went from 3.3 is now 2.8 -Continue to monitor and trend and repeat CMP in the a.m.  Left eye conjunctivitis -Start ophthalmic antibiotic drops as well as analgesic drops  DVT prophylaxis:  apixaban (ELIQUIS) tablet 5 mg    Code Status: Full Code Family Communication: No family currently at bedside  Disposition Plan:  Level of care: Telemetry Status is: Inpatient Remains inpatient appropriate because: Continues to get treated for his cellulitis    Consultants:  None  Procedures:  None Antimicrobials:  Anti-infectives (From admission, onward)    Start     Dose/Rate Route Frequency Ordered Stop   07/18/22 1400  vancomycin (VANCOREADY) IVPB 1750 mg/350 mL        1,750 mg 175 mL/hr over 120 Minutes Intravenous Every 24 hours 07/17/22 1341     07/18/22 1200  levofloxacin (LEVAQUIN) IVPB 500 mg  Status:  Discontinued        500 mg 100 mL/hr over 60 Minutes Intravenous Every 24 hours 07/17/22 1315 07/18/22 1832   07/17/22 1515  vancomycin (VANCOREADY) IVPB 750 mg/150 mL        750 mg 150 mL/hr over 60 Minutes Intravenous STAT 07/17/22 1505 07/17/22 1711   07/17/22 1345  vancomycin (VANCOREADY) IVPB 750 mg/150 mL  Status:  Discontinued        750 mg 150 mL/hr over 60 Minutes Intravenous NOW 07/17/22 1341 07/17/22 1505   07/17/22 1115  vancomycin (VANCOCIN) IVPB 1000 mg/200 mL premix        1,000 mg 200 mL/hr over 60 Minutes Intravenous  Once 07/17/22 1114 07/17/22 1337   07/17/22 1115  levofloxacin (LEVAQUIN) IVPB 750 mg        750 mg 100 mL/hr over 90 Minutes Intravenous  Once 07/17/22 1114 07/17/22 1333        Subjective: Seen and examined at bedside and he states that he is feeling better.  Continues some pain in his foot and also was complaining of  a headache.  States that he just received some acetaminophen.  No nausea or vomiting."  Later on that evening he started complaining about some left eye pain and had some drainage.  No other concerns or complaints at this time.  Objective: Vitals:   07/17/22 2132 07/18/22 0150 07/18/22 0513 07/18/22 1357  BP: (!) 153/85 (!) 148/94 (!) 154/96 (!) 148/96  Pulse: 83 82 79 82  Resp: (!) _0 (!) 22  Temp: 98.4 F (36.9 C) 99.2 F (37.3 C) 98.2 F (36.8 C) 99 F (37.2 C)  TempSrc: Oral     SpO2: 99% 98% 98% 100%  Weight:      Height:        Intake/Output Summary (Last 24 hours) at 07/18/2022 1833 Last data filed at 07/18/2022 1700 Gross per 24 hour  Intake --  Output 2675 ml  Net -2675 ml   Filed Weights   07/17/22 1155  Weight: 86.2 kg    Examination: Physical Exam:  Constitutional: WN/WD overweight chronically ill-appearing Caucasian male currently no acute distress appears a little uncomfortable Respiratory: Diminished to auscultation bilaterally, no wheezing, rales, rhonchi or crackles. Normal respiratory effort and patient is not tachypenic.  No accessory muscle use.  Unlabored breathing Cardiovascular: RRR, no murmurs / rubs / gallops. S1 and S2 auscultated.  Has some lower extremity swelling worse on the left compared to right Abdomen: Soft, non-tender, slightly distended second body habitus.  Bowel sounds positive.  GU: Deferred. Musculoskeletal: No clubbing / cyanosis of digits/nails. No joint deformity upper and lower extremities. Skin: No rashes, lesions, ulcers on limited skin evaluation. No induration; Warm and dry.  Neurologic: CN 2-12 grossly intact with no focal deficits. Romberg sign and cerebellar reflexes not assessed.  Psychiatric: Normal judgment and insight. Alert and oriented x 3. Normal mood and appropriate affect.   Data Reviewed: I have personally reviewed following labs and imaging studies  CBC: Recent Labs  Lab 07/17/22 1114 07/18/22 0529   WBC 13.3* 12.6*  NEUTROABS 11.1*  --   HGB 13.7 12.1*  HCT 39.6 35.2*  MCV 88.8 89.8  PLT 161 622*   Basic Metabolic Panel: Recent Labs  Lab 07/17/22 1114 07/18/22 0529  NA 143 143  K 2.5* 3.5  CL 110 112*  CO2 25 25  GLUCOSE 160* 153*  BUN 19 14  CREATININE 0.87 0.81  CALCIUM 8.1* 7.9*  MG 2.0  --   PHOS 3.1  --    GFR: Estimated Creatinine Clearance: 70.1 mL/min (by C-G formula based on SCr of 0.81 mg/dL). Liver Function Tests: Recent Labs  Lab 07/17/22 1114 07/18/22 0529  AST 34 25  ALT 38 30  ALKPHOS 67 63  BILITOT 1.4* 1.4*  PROT 6.3* 5.7*  ALBUMIN 3.3* 2.8*   No results for input(s): "LIPASE", "AMYLASE" in the last 168 hours. No results for input(s): "AMMONIA" in the last 168 hours. Coagulation Profile: Recent Labs  Lab 07/17/22 1114  INR 1.2   Cardiac Enzymes: No results for input(s): "CKTOTAL", "CKMB", "CKMBINDEX", "TROPONINI" in the last 168 hours. BNP (last 3 results) No results for input(s): "PROBNP" in the last 8760 hours. HbA1C: No results for input(s): "HGBA1C" in the last 72 hours. CBG: No results for input(s): "GLUCAP" in the last 168 hours. Lipid Profile: No results for input(s): "CHOL", "HDL", "LDLCALC", "TRIG", "CHOLHDL", "LDLDIRECT" in the last 72 hours. Thyroid Function Tests: No results for input(s): "TSH", "T4TOTAL", "FREET4", "T3FREE", "THYROIDAB" in the last 72 hours. Anemia Panel: No results for input(s): "VITAMINB12", "FOLATE", "FERRITIN", "TIBC", "IRON", "RETICCTPCT" in the last 72 hours. Sepsis Labs: Recent Labs  Lab 07/17/22 1114 07/17/22 1558  LATICACIDVEN 1.3 1.5    Recent Results (from the past 240 hour(s))  Blood Culture (routine x 2)     Status: None (Preliminary result)   Collection Time: 07/17/22 11:15 AM   Specimen: BLOOD  Result Value Ref Range Status   Specimen Description   Final    BLOOD BLOOD LEFT FOREARM Performed at [facility], [place] [address] [place], [place] [address]     Special Requests   Final    BOTTLES DRAWN AEROBIC AND ANAEROBIC Blood Culture results may not be optimal due to an inadequate volume of blood received in culture bottles Performed at [facility] [address] [place], [place] [address]    Culture   Final    NO GROWTH < 24 HOURS Performed at [facility] [address] [place], [place] [address]    Report Status PENDING  Incomplete  Resp Panel by RT-PCR (Flu A&B, Covid) Anterior Nasal Swab     Status: None   Collection Time: 07/17/22 12:17 PM   Specimen: Anterior Nasal  Swab  Result Value Ref Range Status   SARS Coronavirus 2 by RT PCR NEGATIVE NEGATIVE Final    Comment: (NOTE) SARS-CoV-2 target nucleic acids are NOT DETECTED.  The SARS-CoV-2 RNA is generally detectable in upper respiratory specimens during the acute phase of infection. The lowest concentration of SARS-CoV-2 viral copies this assay can detect is 138 copies/mL. A negative result does not preclude SARS-Cov-2 infection and should not be used as the sole basis for treatment or other patient management decisions. A negative result may occur with  improper specimen collection/handling, submission of specimen other than nasopharyngeal swab, presence of viral mutation(s) within the areas targeted by this assay, and inadequate number of viral copies(<138 copies/mL). A negative result must be combined with clinical observations, patient history, and epidemiological information. The expected result is Negative.  Fact Sheet for Patients:  EntrepreneurPulse.com.au  Fact Sheet for Healthcare Providers:  IncredibleEmployment.be  This test is no t yet approved or cleared by the [place] FDA and  has been authorized for detection and/or diagnosis of SARS-CoV-2 by FDA under an Emergency Use Authorization (EUA). This EUA will remain  in effect (meaning this test can be used) for the duration of the COVID-19  declaration under Section 564(b)(1) of the Act, 21 U.S.C.section 360bbb-3(b)(1), unless the authorization is terminated  or revoked sooner.       Influenza A by PCR NEGATIVE NEGATIVE Final   Influenza B by PCR NEGATIVE NEGATIVE Final    Comment: (NOTE) The Xpert Xpress SARS-CoV-2/FLU/RSV plus assay is intended as an aid in the diagnosis of influenza from Nasopharyngeal swab specimens and should not be used as a sole basis for treatment. Nasal washings and aspirates are unacceptable for Xpert Xpress SARS-CoV-2/FLU/RSV testing.  Fact Sheet for Patients: EntrepreneurPulse.com.au  Fact Sheet for Healthcare Providers: IncredibleEmployment.be  This test is not yet approved or cleared by the [place] FDA and has been authorized for detection and/or diagnosis of SARS-CoV-2 by FDA under an Emergency Use Authorization (EUA). This EUA will remain in effect (meaning this test can be used) for the duration of the COVID-19 declaration under Section 564(b)(1) of the Act, 21 U.S.C. section 360bbb-3(b)(1), unless the authorization is terminated or revoked.  Performed at [facility], [place] [address], [place], [place] [address]   Blood Culture (routine x 2)     Status: None (Preliminary result)   Collection Time: 07/17/22  5:42 PM   Specimen: BLOOD  Result Value Ref Range Status   Specimen Description   Final    BLOOD RIGHT ANTECUBITAL Performed at [facility] [address] [place], [place] [address]    Special Requests   Final    BOTTLES DRAWN AEROBIC ONLY Blood Culture adequate volume Performed at [facility] [address] [place], [place] [address]    Culture   Final    NO GROWTH < 12 HOURS Performed at [facility] [address], [place], [place] [address]    Report Status PENDING  Incomplete    Radiology Studies: DG Foot Complete Left  Result Date:  07/17/2022 CLINICAL DATA:  Altered mental status and left foot swelling and redness EXAM: LEFT FOOT - COMPLETE 3+ VIEW COMPARISON:  None Available. FINDINGS: No fracture or dislocation. No radiographic evidence of osteomyelitis. Scattered degenerative arthritis greatest at the first MTP joint where it is advanced. Os peroneum. IMPRESSION: No radiographic evidence of osteomyelitis. Electronically Signed   By: Placido Sou M.D.   On: 07/17/2022 12:19  DG Chest Port 1 View  Result Date: 07/17/2022 CLINICAL DATA:  Possible sepsis EXAM: PORTABLE CHEST 1 VIEW COMPARISON:  09/15/2019 FINDINGS: Transverse diameter heart is increased. There are no signs of pulmonary edema or focal pulmonary consolidation. There is no pleural effusion or pneumothorax. Degenerative changes are noted in right shoulder. IMPRESSION: Cardiomegaly. There are no signs of pulmonary edema or focal pulmonary consolidation. Electronically Signed   By: Elmer Picker M.D.   On: 07/17/2022 12:18    Scheduled Meds:  apixaban  5 mg Oral BID   calcium carbonate  1 tablet Oral Once   irbesartan  150 mg Oral Daily   multivitamin with minerals  1 tablet Oral Daily   Continuous Infusions:  vancomycin 1,750 mg (07/18/22 1439)    LOS: 1 day   Raiford Noble, DO [facility] Available via Epic secure chat 7am-7pm After these hours, please refer to coverage provider listed on [email or web address] 07/18/2022, 6:33 PM

## 2022-07-18 NOTE — Progress Notes (Signed)
Initial Nutrition Assessment  DOCUMENTATION CODES:   Non-severe (moderate) malnutrition in context of acute illness/injury  INTERVENTION:   -Multivitamin with minerals daily  -Liberalize diet to regular to maximize menu options  NUTRITION DIAGNOSIS:   Moderate Malnutrition related to acute illness, poor appetite (LLE cellulitis) as evidenced by mild fat depletion, mild muscle depletion.  GOAL:   Patient will meet greater than or equal to 90% of their needs  MONITOR:   PO intake, Supplement acceptance, Labs, Weight trends, I & O's, Skin  REASON FOR ASSESSMENT:   Malnutrition Screening Tool    ASSESSMENT:   86 y.o. male with medical history significant of osteoarthritis, chronic atrial fibrillation, chronic back pain, glaucoma, hypertension, overweight who is brought to the emergency department due to decreased oral intake, fatigue, malaise associated with left lower extremity tenderness, edema and erythema.  Patient in room, complains of pain and needing some medication. States he only ate oatmeal for breakfast this morning. Says his appetite has not been good d/t pain levels. Was unable to give time frame of how long his appetite has been down. RD placed order for lunch. Will liberalize diet to regular to provide more menu options for patient.   Per pt UBW is ~185-190 lbs. No weight loss noted.  Medications: Tums  Labs reviewed.  NUTRITION - FOCUSED PHYSICAL EXAM:  Flowsheet Row Most Recent Value  Orbital Region Mild depletion  Upper Arm Region Mild depletion  Thoracic and Lumbar Region Mild depletion  Buccal Region Mild depletion  Temple Region Mild depletion  Clavicle Bone Region Mild depletion  Clavicle and Acromion Bone Region Mild depletion  Scapular Bone Region Mild depletion  Dorsal Hand Mild depletion  Patellar Region Unable to assess  Anterior Thigh Region Unable to assess  Posterior Calf Region Unable to assess  Edema (RD Assessment) None  Hair  Reviewed  Eyes Reviewed  Mouth Reviewed  Skin Reviewed       Diet Order:   Diet Order             Diet regular Room service appropriate? Yes; Fluid consistency: Thin  Diet effective now                   EDUCATION NEEDS:   Not appropriate for education at this time  Skin:  Skin Assessment: Reviewed RN Assessment  Last BM:  7/27  Height:   Ht Readings from Last 1 Encounters:  07/17/22 '5\' 10"'$  (1.778 m)    Weight:   Wt Readings from Last 1 Encounters:  07/17/22 86.2 kg    BMI:  Body mass index is 27.26 kg/m.  Estimated Nutritional Needs:   Kcal:  1900-2100  Protein:  90-100g  Fluid:  2L/day  Clayton Bibles, MS, RD, LDN Inpatient Clinical Dietitian Contact information available via Amion

## 2022-07-19 DIAGNOSIS — I482 Chronic atrial fibrillation, unspecified: Secondary | ICD-10-CM | POA: Diagnosis not present

## 2022-07-19 DIAGNOSIS — L0390 Cellulitis, unspecified: Secondary | ICD-10-CM | POA: Diagnosis not present

## 2022-07-19 DIAGNOSIS — L03116 Cellulitis of left lower limb: Secondary | ICD-10-CM | POA: Diagnosis not present

## 2022-07-19 DIAGNOSIS — I10 Essential (primary) hypertension: Secondary | ICD-10-CM

## 2022-07-19 DIAGNOSIS — A419 Sepsis, unspecified organism: Secondary | ICD-10-CM | POA: Diagnosis not present

## 2022-07-19 LAB — URINE CULTURE: Culture: NO GROWTH

## 2022-07-19 LAB — COMPREHENSIVE METABOLIC PANEL
ALT: 27 U/L (ref 0–44)
AST: 23 U/L (ref 15–41)
Albumin: 2.9 g/dL — ABNORMAL LOW (ref 3.5–5.0)
Alkaline Phosphatase: 66 U/L (ref 38–126)
Anion gap: 8 (ref 5–15)
BUN: 16 mg/dL (ref 8–23)
CO2: 26 mmol/L (ref 22–32)
Calcium: 8 mg/dL — ABNORMAL LOW (ref 8.9–10.3)
Chloride: 107 mmol/L (ref 98–111)
Creatinine, Ser: 0.83 mg/dL (ref 0.61–1.24)
GFR, Estimated: >60 mL/min (ref >60)
Glucose, Bld: 138 mg/dL — ABNORMAL HIGH (ref 70–99)
Potassium: 3.2 mmol/L — ABNORMAL LOW (ref 3.5–5.1)
Sodium: 141 mmol/L (ref 135–145)
Total Bilirubin: 1 mg/dL (ref 0.3–1.2)
Total Protein: 6 g/dL — ABNORMAL LOW (ref 6.5–8.1)

## 2022-07-19 LAB — CBC WITH DIFFERENTIAL/PLATELET
Abs Immature Granulocytes: 0.12 10*3/uL — ABNORMAL HIGH (ref 0.00–0.07)
Basophils Absolute: 0 10*3/uL (ref 0.0–0.1)
Basophils Relative: 0 %
Eosinophils Absolute: 0.3 10*3/uL (ref 0.0–0.5)
Eosinophils Relative: 3 %
HCT: 37.9 % — ABNORMAL LOW (ref 39.0–52.0)
Hemoglobin: 13 g/dL (ref 13.0–17.0)
Immature Granulocytes: 1 %
Lymphocytes Relative: 21 %
Lymphs Abs: 2.1 10*3/uL (ref 0.7–4.0)
MCH: 30.8 pg (ref 26.0–34.0)
MCHC: 34.3 g/dL (ref 30.0–36.0)
MCV: 89.8 fL (ref 80.0–100.0)
Monocytes Absolute: 0.8 10*3/uL (ref 0.1–1.0)
Monocytes Relative: 8 %
Neutro Abs: 6.6 10*3/uL (ref 1.7–7.7)
Neutrophils Relative %: 67 %
Platelets: 172 10*3/uL (ref 150–400)
RBC: 4.22 MIL/uL (ref 4.22–5.81)
RDW: 14.1 % (ref 11.5–15.5)
WBC: 9.8 10*3/uL (ref 4.0–10.5)
nRBC: 0 % (ref 0.0–0.2)

## 2022-07-19 LAB — PHOSPHORUS: Phosphorus: 2.5 mg/dL (ref 2.5–4.6)

## 2022-07-19 LAB — MAGNESIUM: Magnesium: 2 mg/dL (ref 1.7–2.4)

## 2022-07-19 MED ORDER — POTASSIUM CHLORIDE CRYS ER 20 MEQ PO TBCR
40.0000 meq | EXTENDED_RELEASE_TABLET | Freq: Two times a day (BID) | ORAL | Status: AC
  Administered 2022-07-19 (×2): 40 meq via ORAL
  Filled 2022-07-19 (×2): qty 2

## 2022-07-19 NOTE — Progress Notes (Signed)
OT Cancellation Note  Patient Details Name: Mathew Simpson MRN: [id number] DOB: October 02, 1936   Cancelled Treatment:    Reason Eval/Treat Not Completed: Fatigue/lethargy limiting ability to participate. Could not arouse patient enough to participate. Will f/u as able.  Alajah Witman L Kalum Minner 07/19/2022, 11:45 AM

## 2022-07-19 NOTE — Evaluation (Signed)
Occupational Therapy Evaluation Patient Details Name: Mathew Simpson MRN: [id number] DOB: 02-06-1936 Today's Date: 07/19/2022   History of Present Illness Mathew Simpson is a 86 y.o. male with medical history significant of osteoarthritis, chronic atrial fibrillation, chronic back pain, glaucoma, hypertension, overweight who is brought to the emergency department due to decreased oral intake, fatigue, malaise associated with left lower extremity tenderness, edema and erythema.  He was febrile upon arrival.  He has been having a headache. Found to have sepsis due to L LE cellulitis.   Clinical Impression   Mr. Mathew Simpson is an 86 year old admitted to hospital with above medical history and presents with generalized weakness, decreased activity tolerance, impaired balance and pain. Patient typically independent and lives alone. Patient needing min assist to ambulate with walker and increased assistance with ADLs including max-total assist for LB ADLs and toileting. Patient will benefit from skilled OT services while in hospital to improve deficits and learn compensatory strategies as needed in order to return to PLOF.        Recommendations for follow up therapy are one component of a multi-disciplinary discharge planning process, led by the attending physician.  Recommendations may be updated based on patient status, additional functional criteria and insurance authorization.   Follow Up Recommendations  Skilled nursing-short term rehab (<3 hours/day)    Assistance Recommended at Discharge Frequent or constant Supervision/Assistance  Patient can return home with the following A little help with walking and/or transfers;A lot of help with bathing/dressing/bathroom;Assistance with cooking/housework;Help with stairs or ramp for entrance;Direct supervision/assist for financial management;Direct supervision/assist for medications management    Functional Status Assessment  Patient has had a recent  decline in their functional status and demonstrates the ability to make significant improvements in function in a reasonable and predictable amount of time.  Equipment Recommendations  Other (comment) (TBD)    Recommendations for Other Services       Precautions / Restrictions Precautions Precautions: Fall Restrictions Weight Bearing Restrictions: No      Mobility Bed Mobility Overal bed mobility: Needs Assistance Bed Mobility: Supine to Sit     Supine to sit: Min assist     General bed mobility comments: MIN A for trunk and increased time with some verbal encouragement    Transfers Overall transfer level: Needs assistance Equipment used: Rolling walker (2 wheels) Transfers: Sit to/from Stand, Bed to chair/wheelchair/BSC Sit to Stand: Min assist, +2 safety/equipment, From elevated surface Stand pivot transfers: Min assist         General transfer comment: cues for hand placement and for posture      Balance Overall balance assessment: Mild deficits observed, not formally tested Sitting-balance support: Feet supported Sitting balance-Leahy Scale: Fair     Standing balance support: Reliant on assistive device for balance Standing balance-Leahy Scale: Poor                             ADL either performed or assessed with clinical judgement   ADL Overall ADL's : Needs assistance/impaired Eating/Feeding: Set up;Sitting   Grooming: Set up;Sitting   Upper Body Bathing: Sitting;Minimal assistance   Lower Body Bathing: Maximal assistance;Sit to/from stand   Upper Body Dressing : Sitting;Minimal assistance   Lower Body Dressing: Sit to/from stand;Total assistance   Toilet Transfer: Minimal assistance;BSC/3in1;Rolling walker (2 wheels)   Toileting- Clothing Manipulation and Hygiene: Total assistance;Sit to/from stand       Functional mobility during ADLs: Minimal assistance;+2  for safety/equipment;Rolling walker (2 wheels)       Vision  Patient Visual Report: No change from baseline       Perception     Praxis      Pertinent Vitals/Pain Pain Assessment Pain Assessment: Faces Faces Pain Scale: Hurts little more Pain Location: headache Pain Descriptors / Indicators: Aching Pain Intervention(s): Limited activity within patient's tolerance     Hand Dominance Right   Extremity/Trunk Assessment Upper Extremity Assessment Upper Extremity Assessment: RUE deficits/detail;LUE deficits/detail;Generalized weakness RUE Deficits / Details: WFL ROM, 3+/5 strength RUE Sensation: WNL RUE Coordination: WNL LUE Deficits / Details: WFL ROM, 3+/5 strength LUE Sensation: WNL LUE Coordination: WNL   Lower Extremity Assessment Lower Extremity Assessment: Defer to PT evaluation LLE Deficits / Details: Knee ROM 75%   Cervical / Trunk Assessment Cervical / Trunk Assessment: Normal   Communication Communication Communication: No difficulties   Cognition Arousal/Alertness: Awake/alert Behavior During Therapy: WFL for tasks assessed/performed Overall Cognitive Status: Impaired/Different from baseline Area of Impairment: Orientation                 Orientation Level: Time, Disoriented to             General Comments: Thought he had been in the hospital a week.     General Comments       Exercises     Shoulder Instructions      Home Living Family/patient expects to be discharged to:: Private residence Living Arrangements: Alone Available Help at Discharge: Family;Available PRN/intermittently Type of Home: House Home Access: Stairs to enter [employer] of Steps: 3-4 Entrance Stairs-Rails: Left Home Layout: One level     Bathroom Shower/Tub: Occupational psychologist: Handicapped height     Home Equipment: Conservation officer, nature (2 wheels);BSC/3in1;Shower seat;Grab bars - tub/shower          Prior Functioning/Environment Prior Level of Function : Independent/Modified Independent              Mobility Comments: no device ADLs Comments: reports still driving        OT Problem List: Decreased strength;Decreased activity tolerance;Impaired balance (sitting and/or standing);Decreased knowledge of use of DME or AE      OT Treatment/Interventions: Self-care/ADL training;Therapeutic exercise;DME and/or AE instruction;Therapeutic activities;Patient/family education;Balance training    OT Goals(Current goals can be found in the care plan section) Acute Rehab OT Goals Patient Stated Goal: get stronger OT Goal Formulation: With patient Time For Goal Achievement: 08/02/22 Potential to Achieve Goals: Good  OT Frequency: Min 2X/week    Co-evaluation PT/OT/SLP Co-Evaluation/Treatment: Yes (coeval)   PT goals addressed during session: Mobility/safety with mobility;Balance        AM-PAC OT "6 Clicks" Daily Activity     Outcome Measure Help from another person eating meals?: A Little Help from another person taking care of personal grooming?: A Little Help from another person toileting, which includes using toliet, bedpan, or urinal?: Total Help from another person bathing (including washing, rinsing, drying)?: A Lot Help from another person to put on and taking off regular upper body clothing?: A Little Help from another person to put on and taking off regular lower body clothing?: Total 6 Click Score: 13   End of Session Equipment Utilized During Treatment: Rolling walker (2 wheels);Gait belt Nurse Communication: Mobility status  Activity Tolerance: Patient tolerated treatment well Patient left: in chair;with call bell/phone within reach;with chair alarm set  OT Visit Diagnosis: Muscle weakness (generalized) (M62.81)  Time: 5852-7782 OT Time Calculation (min): 22 min Charges:  OT General Charges $OT Visit: 1 Visit OT Evaluation $OT Eval Low Complexity: 1 Low  Trinetta Alemu, OTR/L [place]  Office [phone] Pager: [place] 07/19/2022, 4:06 PM

## 2022-07-19 NOTE — Evaluation (Signed)
Physical Therapy Evaluation Patient Details Name: Mathew Simpson MRN: [id number] DOB: 1936-10-10 Today's Date: 07/19/2022  History of Present Illness  Mathew Simpson is a 86 y.o. male with medical history significant of osteoarthritis, chronic atrial fibrillation, chronic back pain, glaucoma, hypertension, overweight who is brought to the emergency department due to decreased oral intake, fatigue, malaise associated with left lower extremity tenderness, edema and erythema.  He was febrile upon arrival.  He has been having a headache. Found to have sepsis due to L LE cellulitis.  Clinical Impression  Pt admitted with above diagnosis.  Pt currently with functional limitations due to the deficits listed below (see PT Problem List). Pt will benefit from skilled PT to increase their independence and safety with mobility to allow discharge to the venue listed below.  Recommend short term SNF for rehab so he can return to his prior level of functioning of living independently.        Recommendations for follow up therapy are one component of a multi-disciplinary discharge planning process, led by the attending physician.  Recommendations may be updated based on patient status, additional functional criteria and insurance authorization.  Follow Up Recommendations Skilled nursing-short term rehab (<3 hours/day) Can patient physically be transported by private vehicle: Yes    Assistance Recommended at Discharge Frequent or constant Supervision/Assistance  Patient can return home with the following  A little help with walking and/or transfers    Equipment Recommendations None recommended by PT  Recommendations for Other Services       Functional Status Assessment Patient has had a recent decline in their functional status and demonstrates the ability to make significant improvements in function in a reasonable and predictable amount of time.     Precautions / Restrictions Precautions Precautions:  Fall Restrictions Weight Bearing Restrictions: No      Mobility  Bed Mobility Overal bed mobility: Needs Assistance Bed Mobility: Supine to Sit     Supine to sit: Min assist     General bed mobility comments: MIN A for trunk    Transfers Overall transfer level: Needs assistance Equipment used: Rolling walker (2 wheels) Transfers: Sit to/from Stand, Bed to chair/wheelchair/BSC Sit to Stand: Min assist, +2 safety/equipment, From elevated surface Stand pivot transfers: Min assist         General transfer comment: cues for hand placement and for posture    Ambulation/Gait               General Gait Details: SPT only due to weakness/fatigue  Stairs            Wheelchair Mobility    Modified Rankin (Stroke Patients Only)       Balance Overall balance assessment: Needs assistance Sitting-balance support: Feet supported Sitting balance-Leahy Scale: Fair     Standing balance support: Reliant on assistive device for balance Standing balance-Leahy Scale: Poor                               Pertinent Vitals/Pain Pain Assessment Pain Assessment: Faces Faces Pain Scale: Hurts even more Pain Location: "aches all over" Pain Descriptors / Indicators: Aching Pain Intervention(s): Monitored during session    Home Living Family/patient expects to be discharged to:: Private residence Living Arrangements: Alone Available Help at Discharge: Family;Available PRN/intermittently Type of Home: House Home Access: Stairs to enter Entrance Stairs-Rails: Left Entrance Stairs-Number of Steps: 3-4   Home Layout: One level Home Equipment: Conservation officer, nature (2  wheels);BSC/3in1;Shower seat;Grab bars - tub/shower      Prior Function Prior Level of Function : Independent/Modified Independent             Mobility Comments: no device ADLs Comments: reports still driving     Hand Dominance   Dominant Hand: Right    Extremity/Trunk Assessment    Upper Extremity Assessment Upper Extremity Assessment: Defer to OT evaluation    Lower Extremity Assessment Lower Extremity Assessment: Generalized weakness;LLE deficits/detail LLE Deficits / Details: Knee ROM 75%    Cervical / Trunk Assessment Cervical / Trunk Assessment: Normal  Communication   Communication: No difficulties  Cognition Arousal/Alertness: Awake/alert Behavior During Therapy: WFL for tasks assessed/performed, Flat affect Overall Cognitive Status: Impaired/Different from baseline Area of Impairment: Orientation                 Orientation Level: Time, Disoriented to             General Comments: Thought he had been in the hospital a week.        General Comments      Exercises     Assessment/Plan    PT Assessment Patient needs continued PT services  PT Problem List Decreased strength;Decreased activity tolerance;Decreased range of motion;Decreased mobility;Decreased knowledge of use of DME;Decreased balance       PT Treatment Interventions DME instruction;Gait training;Functional mobility training;Balance training;Therapeutic exercise;Therapeutic activities;Patient/family education    PT Goals (Current goals can be found in the Care Plan section)  Acute Rehab PT Goals Patient Stated Goal: To get stronger PT Goal Formulation: With patient/family Time For Goal Achievement: 08/02/22 Potential to Achieve Goals: Good    Frequency Min 2X/week     Co-evaluation PT/OT/SLP Co-Evaluation/Treatment: Yes   PT goals addressed during session: Mobility/safety with mobility;Balance         AM-PAC PT "6 Clicks" Mobility  Outcome Measure Help needed turning from your back to your side while in a flat bed without using bedrails?: A Little Help needed moving from lying on your back to sitting on the side of a flat bed without using bedrails?: A Lot Help needed moving to and from a bed to a chair (including a wheelchair)?: A Little Help needed  standing up from a chair using your arms (e.g., wheelchair or bedside chair)?: A Little Help needed to walk in hospital room?: A Lot Help needed climbing 3-5 steps with a railing? : A Lot 6 Click Score: 15    End of Session Equipment Utilized During Treatment: Gait belt Activity Tolerance: Patient tolerated treatment well;Patient limited by fatigue Patient left: in chair;with call bell/phone within reach;with chair alarm set;with family/visitor present   PT Visit Diagnosis: Unsteadiness on feet (R26.81);Muscle weakness (generalized) (M62.81);Difficulty in walking, not elsewhere classified (R26.2)    Time: 2993-7169 PT Time Calculation (min) (ACUTE ONLY): 22 min   Charges:   PT Evaluation $PT Eval Moderate Complexity: 1 Mod          Elissa Grieshop L. Tamala Julian, [place]  07/19/2022   Galen Manila 07/19/2022, 2:29 PM

## 2022-07-19 NOTE — Progress Notes (Signed)
PROGRESS NOTE    Mathew Simpson  FTD:[id number] DOB: March 18, 1936 DOA: 07/17/2022 PCP: Secundino Ginger, PA-C   Brief Narrative:  HPI per Dr. Tennis Must on 07/17/22  HPI: Mathew Simpson is a 86 y.o. male with medical history significant of osteoarthritis, chronic atrial fibrillation, chronic back pain, glaucoma, hypertension, overweight who is brought to the emergency department due to decreased oral intake, fatigue, malaise associated with left lower extremity tenderness, edema and erythema.  He was febrile upon arrival.  He has been having a headache. He denied rhinorrhea, sore throat, wheezing or hemoptysis.  No chest pain, palpitations, diaphoresis, PND, orthopnea or pitting edema of the lower extremities.  No abdominal pain, nausea, emesis, diarrhea, constipation, melena or hematochezia.  No flank pain, dysuria, frequency or hematuria.  No polyuria, polydipsia, polyphagia or blurred vision.    ED course: Initial vital signs were temperature 100.7 F, pulse 86, respiration 24, BP 145/93 and O2 sat 97% on room air.  The patient received 1000 mL of LR bolus, acetaminophen 650 mg p.o. x1, KCl 40 mEq p.o., L 10 mEq IVPB x2, vancomycin and IV levofloxacin.   Lab work: His urinalysis show glucosuria 50 and proteinuria 30 mg/dL.  There was moderate hemoglobinuria but only 6-10 RBC per hpf on microscopic examination.  CBC showed a white count of 13.3, hemoglobin 13.7 g/dL platelets 161.  Normal PT, INR and PTT.  Lactic acid so far normal.  CMP showed a potassium of 2.5 mmol/L, but the rest of the electrolytes are normal when calcium is corrected.  Unremarkable renal function.  LFTs show total protein of 6.3 and albumin of 3.3 g/dL.  Alk phos and transaminases were normal.  Total bilirubin was 1.4 mg/dL.   Imaging: Two-view chest radiograph showed cardiomegaly without signs of pulmonary edema or consolidation.  Left foot x-ray with no radiographic evidence of osteomyelitis.  *Interim History Patient was  placed on IV levofloxacin and IV vancomycin but will de-escalate and stop the IV levofloxacin.  Patient was also complaining about some eye pain and some itching with some drainage so we will place him on oral antibiotics.  He is improving slowly but is a little somnolent and sleepy today.  Leg appears to be looking better.  PT OT recommending SNF   Assessment and Plan:  Cellulitis of Left Lower Extremity Cellulitis of Left Foot Sepsis due to cellulitis ([place]) -Admit to telemetry/inpatient. -Patient met sepsis criteria admission as he is febrile and tachypneic with a source of infection -DG foot showed "No fracture or dislocation. No radiographic evidence of osteomyelitis. Scattered degenerative arthritis greatest at the first MTP joint where it is advanced. Os peroneum" -Continued IV fluids and now stopped  -We will discontinue Levaquin and just continue vancomycin per pharmacy. -Follow-up blood culture and sensitivity -WBC went from 13.3 -> 12.6 -> 9.8 -Blood cultures x2 today at 2 days -Urinalysis done and showed clear appearance with yellow color urine, 50 glucose, moderate hemoglobin, negative ketones, negative leukocytes, negative nitrites with no bacteria seen in urine culture showing no growth -Follow CBC and CMP in a.m.   Hypokalemia -Supplementing. -Patient's potassium is now 3.2 -Replete with p.o. KCl 40 mEQ BID x2 -Mag level was 2.0 -Continue to monitor and replete as necessary -Repeat CMP in a.m.   Essential Hypertension -Continue with Irbesartan 150 mg daily -Continue to monitor blood pressures per protocol -Last blood pressure reading was 146/86   Chronic atrial fibrillation (HCC) -CHA2DS2-VASc Score of at least 5.. -Continue apixaban 5 mg p.o.  twice daily. -Currently on no rate control meds. -Optimize electrolytes.   Moderate protein malnutrition ([place]) -Nutrition Status: Nutrition Problem: Moderate Malnutrition Etiology: acute illness, poor appetite (LLE  cellulitis) Signs/Symptoms: mild fat depletion, mild muscle depletion Interventions: MVI, Liberalize Diet  Hyperbilirubinemia -Mild and patient's T. bili has gone from 1.4 -> 1.0  Negative monitor trend and repeat CMP in a.m.   Normocytic Anemia -Patient's hemoglobin/hematocrit went from 13.7/39.6 is now 12.1/35.2 yesterday and today is 13.0/37.9 -Check anemia panel in a.m. -Continue to monitor for signs and symptoms bleeding; no overt bleeding noted -Repeat CBC in a.m.   Thrombocytopenia -Mild and patient's platelet count went from 161 -> 143 -> 172 -Continue monitor trend and repeat CBC in a.m.   Hypoalbuminemia -Patient's albumin level went from 3.3 -> 2.8 -> 2.9 -Continue to monitor and trend and repeat CMP in the a.m.   Left eye conjunctivitis -Start ophthalmic antibiotic drops as well as analgesic drops; he is now on ciprofloxacin 0.3 ophthalmic drops 1 drop in left eye for every 4h while awake and ketorolac 0.5% ophthalmic solution 1 drop in left eye 4 times daily   DVT prophylaxis:  apixaban (ELIQUIS) tablet 5 mg    Code Status: Full Code Family Communication: No family currently at bedside  Disposition Plan:  Level of care: Telemetry Status is: Inpatient Remains inpatient appropriate because: Continues to have a leg infection which is improving.  His was somnolent.  PT OT recommending SNF so we will need TOC assistance with placement   Consultants:  None  Procedures:  None  Antimicrobials:  Anti-infectives (From admission, onward)    Start     Dose/Rate Route Frequency Ordered Stop   07/18/22 1400  vancomycin (VANCOREADY) IVPB 1750 mg/350 mL        1,750 mg 175 mL/hr over 120 Minutes Intravenous Every 24 hours 07/17/22 1341     07/18/22 1200  levofloxacin (LEVAQUIN) IVPB 500 mg  Status:  Discontinued        500 mg 100 mL/hr over 60 Minutes Intravenous Every 24 hours 07/17/22 1315 07/18/22 1832   07/17/22 1515  vancomycin (VANCOREADY) IVPB 750 mg/150 mL         750 mg 150 mL/hr over 60 Minutes Intravenous STAT 07/17/22 1505 07/17/22 1711   07/17/22 1345  vancomycin (VANCOREADY) IVPB 750 mg/150 mL  Status:  Discontinued        750 mg 150 mL/hr over 60 Minutes Intravenous NOW 07/17/22 1341 07/17/22 1505   07/17/22 1115  vancomycin (VANCOCIN) IVPB 1000 mg/200 mL premix        1,000 mg 200 mL/hr over 60 Minutes Intravenous  Once 07/17/22 1114 07/17/22 1337   07/17/22 1115  levofloxacin (LEVAQUIN) IVPB 750 mg        750 mg 100 mL/hr over 90 Minutes Intravenous  Once 07/17/22 1114 07/17/22 1333       Subjective: Seen and examined at bedside and he was a little somnolent and drowsy.  He briefly wake up for the encounter but wanted to rest.  Denies any complaints at this time.  Leg is looking little bit better.  PT OT evaluated and recommending SNF.  No other concerns at this time.  Objective: Vitals:   07/18/22 1357 07/18/22 2035 07/19/22 0523 07/19/22 1341  BP: (!) 148/96 (!) 161/82 (!) 151/86 (!) 146/86  Pulse: 82 83 62 93  Resp: (!) 22 19 18    Temp: 99 F (37.2 C) 99 F (37.2 C) 98.1 F (36.7 C) 97.9 F (36.6  C)  TempSrc:  Oral Oral Oral  SpO2: 100% 99% 99% 98%  Weight:      Height:        Intake/Output Summary (Last 24 hours) at 07/19/2022 1559 Last data filed at 07/19/2022 1324 Gross per 24 hour  Intake --  Output 2500 ml  Net -2500 ml   Filed Weights   07/17/22 1155  Weight: 86.2 kg   Examination: Physical Exam:  Constitutional: WN/WD overweight chronically ill appearing elderly Caucasian male in NAD Respiratory: Diminished to auscultation bilaterally with coarse breath sounds, no wheezing, rales, rhonchi or crackles. Normal respiratory effort and patient is not tachypenic. No accessory muscle use.  Unlabored breathing Cardiovascular: RRR, no murmurs / rubs / gallops. S1 and S2 auscultated.  Has some swelling on his left leg and left foot compared to the right Abdomen: Soft, non-tender, distended secondary body habitus.  Bowel sounds positive.  GU: Deferred. Musculoskeletal: No clubbing / cyanosis of digits/nails. No joint deformity upper and lower extremities.  Skin: Left leg is warm and erythematous and slightly improving Neurologic: Patient is a somnolent and drowsy and wants to sleep Psychiatric: Impaired judgment and insight.  He is somnolent and drowsy  Data Reviewed: I have personally reviewed following labs and imaging studies  CBC: Recent Labs  Lab 07/17/22 1114 07/18/22 0529 07/19/22 0555  WBC 13.3* 12.6* 9.8  NEUTROABS 11.1*  --  6.6  HGB 13.7 12.1* 13.0  HCT 39.6 35.2* 37.9*  MCV 88.8 89.8 89.8  PLT 161 143* 401   Basic Metabolic Panel: Recent Labs  Lab 07/17/22 1114 07/18/22 0529 07/19/22 0555  NA 143 143 141  K 2.5* 3.5 3.2*  CL 110 112* 107  CO2 25 25 26   GLUCOSE 160* 153* 138*  BUN 19 14 16   CREATININE 0.87 0.81 0.83  CALCIUM 8.1* 7.9* 8.0*  MG 2.0  --  2.0  PHOS 3.1  --  2.5   GFR: Estimated Creatinine Clearance: 68.4 mL/min (by C-G formula based on SCr of 0.83 mg/dL). Liver Function Tests: Recent Labs  Lab 07/17/22 1114 07/18/22 0529 07/19/22 0555  AST 34 25 23  ALT 38 30 27  ALKPHOS 67 63 66  BILITOT 1.4* 1.4* 1.0  PROT 6.3* 5.7* 6.0*  ALBUMIN 3.3* 2.8* 2.9*   No results for input(s): "LIPASE", "AMYLASE" in the last 168 hours. No results for input(s): "AMMONIA" in the last 168 hours. Coagulation Profile: Recent Labs  Lab 07/17/22 1114  INR 1.2   Cardiac Enzymes: No results for input(s): "CKTOTAL", "CKMB", "CKMBINDEX", "TROPONINI" in the last 168 hours. BNP (last 3 results) No results for input(s): "PROBNP" in the last 8760 hours. HbA1C: No results for input(s): "HGBA1C" in the last 72 hours. CBG: No results for input(s): "GLUCAP" in the last 168 hours. Lipid Profile: No results for input(s): "CHOL", "HDL", "LDLCALC", "TRIG", "CHOLHDL", "LDLDIRECT" in the last 72 hours. Thyroid Function Tests: No results for input(s): "TSH", "T4TOTAL",  "FREET4", "T3FREE", "THYROIDAB" in the last 72 hours. Anemia Panel: No results for input(s): "VITAMINB12", "FOLATE", "FERRITIN", "TIBC", "IRON", "RETICCTPCT" in the last 72 hours. Sepsis Labs: Recent Labs  Lab 07/17/22 1114 07/17/22 1558  LATICACIDVEN 1.3 1.5    Recent Results (from the past 240 hour(s))  Blood Culture (routine x 2)     Status: None (Preliminary result)   Collection Time: 07/17/22 11:15 AM   Specimen: BLOOD  Result Value Ref Range Status   Specimen Description   Final    BLOOD BLOOD LEFT FOREARM Performed at [facility]  [place] [address], [place], [place] [address]    Special Requests   Final    BOTTLES DRAWN AEROBIC AND ANAEROBIC Blood Culture results may not be optimal due to an inadequate volume of blood received in culture bottles Performed at [facility] [address], [place], [place] [address]    Culture   Final    NO GROWTH 2 DAYS Performed at [facility] [address] [place], [place] [address]    Report Status PENDING  Incomplete  Resp Panel by RT-PCR (Flu A&B, Covid) Anterior Nasal Swab     Status: None   Collection Time: 07/17/22 12:17 PM   Specimen: Anterior Nasal Swab  Result Value Ref Range Status   SARS Coronavirus 2 by RT PCR NEGATIVE NEGATIVE Final    Comment: (NOTE) SARS-CoV-2 target nucleic acids are NOT DETECTED.  The SARS-CoV-2 RNA is generally detectable in upper respiratory specimens during the acute phase of infection. The lowest concentration of SARS-CoV-2 viral copies this assay can detect is 138 copies/mL. A negative result does not preclude SARS-Cov-2 infection and should not be used as the sole basis for treatment or other patient management decisions. A negative result may occur with  improper specimen collection/handling, submission of specimen other than nasopharyngeal swab, presence of viral mutation(s) within the areas targeted by this assay, and inadequate number of  viral copies(<138 copies/mL). A negative result must be combined with clinical observations, patient history, and epidemiological information. The expected result is Negative.  Fact Sheet for Patients:  EntrepreneurPulse.com.au  Fact Sheet for Healthcare Providers:  IncredibleEmployment.be  This test is no t yet approved or cleared by the [place] FDA and  has been authorized for detection and/or diagnosis of SARS-CoV-2 by FDA under an Emergency Use Authorization (EUA). This EUA will remain  in effect (meaning this test can be used) for the duration of the COVID-19 declaration under Section 564(b)(1) of the Act, 21 U.S.C.section 360bbb-3(b)(1), unless the authorization is terminated  or revoked sooner.       Influenza A by PCR NEGATIVE NEGATIVE Final   Influenza B by PCR NEGATIVE NEGATIVE Final    Comment: (NOTE) The Xpert Xpress SARS-CoV-2/FLU/RSV plus assay is intended as an aid in the diagnosis of influenza from Nasopharyngeal swab specimens and should not be used as a sole basis for treatment. Nasal washings and aspirates are unacceptable for Xpert Xpress SARS-CoV-2/FLU/RSV testing.  Fact Sheet for Patients: EntrepreneurPulse.com.au  Fact Sheet for Healthcare Providers: IncredibleEmployment.be  This test is not yet approved or cleared by the [place] FDA and has been authorized for detection and/or diagnosis of SARS-CoV-2 by FDA under an Emergency Use Authorization (EUA). This EUA will remain in effect (meaning this test can be used) for the duration of the COVID-19 declaration under Section 564(b)(1) of the Act, 21 U.S.C. section 360bbb-3(b)(1), unless the authorization is terminated or revoked.  Performed at [facility], [place] [address] [place], [place] [address]   Urine Culture     Status: None   Collection Time: 07/17/22  4:05 PM   Specimen: In/Out Cath Urine   Result Value Ref Range Status   Specimen Description   Final    IN/OUT CATH URINE Performed at [facility] [address] [place], [place] [address]    Special Requests   Final    NONE Performed at [facility], [place] [address], [place], [place] [address]    Culture  Final    NO GROWTH Performed at [facility], [place] [address] [place], [place] [address]    Report Status 07/19/2022 FINAL  Final  Blood Culture (routine x 2)     Status: None (Preliminary result)   Collection Time: 07/17/22  5:42 PM   Specimen: BLOOD  Result Value Ref Range Status   Specimen Description   Final    BLOOD RIGHT ANTECUBITAL Performed at [facility] [address], [place], [place] [address]    Special Requests   Final    BOTTLES DRAWN AEROBIC ONLY Blood Culture adequate volume Performed at [facility] [address], [place], [place] [address]    Culture   Final    NO GROWTH 2 DAYS Performed at [facility] [address], [place], [place] [address]    Report Status PENDING  Incomplete     Radiology Studies: No results found.   Scheduled Meds:  apixaban  5 mg Oral BID   calcium carbonate  1 tablet Oral Once   ciprofloxacin  1 drop Left Eye Q4H while awake   irbesartan  150 mg Oral Daily   ketorolac  1 drop Left Eye QID   multivitamin with minerals  1 tablet Oral Daily   potassium chloride  40 mEq Oral BID   Continuous Infusions:  vancomycin 1,750 mg (07/19/22 1444)     LOS: 2 days   Raiford Noble, DO [facility] Available via Epic secure chat 7am-7pm After these hours, please refer to coverage provider listed on [email or web address] 07/19/2022, 3:59 PM

## 2022-07-20 DIAGNOSIS — L03116 Cellulitis of left lower limb: Secondary | ICD-10-CM | POA: Diagnosis not present

## 2022-07-20 DIAGNOSIS — A419 Sepsis, unspecified organism: Secondary | ICD-10-CM | POA: Diagnosis not present

## 2022-07-20 DIAGNOSIS — I482 Chronic atrial fibrillation, unspecified: Secondary | ICD-10-CM | POA: Diagnosis not present

## 2022-07-20 DIAGNOSIS — L0390 Cellulitis, unspecified: Secondary | ICD-10-CM | POA: Diagnosis not present

## 2022-07-20 LAB — CBC WITH DIFFERENTIAL/PLATELET
Abs Immature Granulocytes: 0.14 10*3/uL — ABNORMAL HIGH (ref 0.00–0.07)
Basophils Absolute: 0.1 10*3/uL (ref 0.0–0.1)
Basophils Relative: 1 %
Eosinophils Absolute: 0.3 10*3/uL (ref 0.0–0.5)
Eosinophils Relative: 3 %
HCT: 38.5 % — ABNORMAL LOW (ref 39.0–52.0)
Hemoglobin: 12.9 g/dL — ABNORMAL LOW (ref 13.0–17.0)
Immature Granulocytes: 2 %
Lymphocytes Relative: 25 %
Lymphs Abs: 2.3 10*3/uL (ref 0.7–4.0)
MCH: 30.4 pg (ref 26.0–34.0)
MCHC: 33.5 g/dL (ref 30.0–36.0)
MCV: 90.6 fL (ref 80.0–100.0)
Monocytes Absolute: 0.8 10*3/uL (ref 0.1–1.0)
Monocytes Relative: 9 %
Neutro Abs: 5.4 10*3/uL (ref 1.7–7.7)
Neutrophils Relative %: 60 %
Platelet Morphology: NORMAL
Platelets: 196 10*3/uL (ref 150–400)
RBC: 4.25 MIL/uL (ref 4.22–5.81)
RDW: 14.1 % (ref 11.5–15.5)
WBC: 9 10*3/uL (ref 4.0–10.5)
nRBC: 0 % (ref 0.0–0.2)

## 2022-07-20 LAB — COMPREHENSIVE METABOLIC PANEL
ALT: 30 U/L (ref 0–44)
AST: 22 U/L (ref 15–41)
Albumin: 3.1 g/dL — ABNORMAL LOW (ref 3.5–5.0)
Alkaline Phosphatase: 71 U/L (ref 38–126)
Anion gap: 8 (ref 5–15)
BUN: 20 mg/dL (ref 8–23)
CO2: 25 mmol/L (ref 22–32)
Calcium: 8.2 mg/dL — ABNORMAL LOW (ref 8.9–10.3)
Chloride: 108 mmol/L (ref 98–111)
Creatinine, Ser: 0.87 mg/dL (ref 0.61–1.24)
GFR, Estimated: >60 mL/min (ref >60)
Glucose, Bld: 148 mg/dL — ABNORMAL HIGH (ref 70–99)
Potassium: 3.3 mmol/L — ABNORMAL LOW (ref 3.5–5.1)
Sodium: 141 mmol/L (ref 135–145)
Total Bilirubin: 0.8 mg/dL (ref 0.3–1.2)
Total Protein: 6.2 g/dL — ABNORMAL LOW (ref 6.5–8.1)

## 2022-07-20 LAB — MAGNESIUM: Magnesium: 2.1 mg/dL (ref 1.7–2.4)

## 2022-07-20 LAB — PHOSPHORUS: Phosphorus: 2.9 mg/dL (ref 2.5–4.6)

## 2022-07-20 MED ORDER — POTASSIUM CHLORIDE CRYS ER 20 MEQ PO TBCR
40.0000 meq | EXTENDED_RELEASE_TABLET | Freq: Two times a day (BID) | ORAL | Status: AC
  Administered 2022-07-20 (×2): 40 meq via ORAL
  Filled 2022-07-20 (×2): qty 2

## 2022-07-20 MED ORDER — CEFAZOLIN SODIUM-DEXTROSE 2-4 GM/100ML-% IV SOLN
2.0000 g | Freq: Three times a day (TID) | INTRAVENOUS | Status: DC
Start: 2022-07-20 — End: 2022-07-23
  Administered 2022-07-20 – 2022-07-23 (×8): 2 g via INTRAVENOUS
  Filled 2022-07-20 (×9): qty 100

## 2022-07-20 NOTE — Progress Notes (Signed)
PROGRESS NOTE    Mathew Simpson  RJJ:[id number] DOB: 1936/06/18 DOA: 07/17/2022 PCP: Secundino Ginger, PA-C   Brief Narrative:  HPI per Dr. Tennis Must on 07/17/22  HPI: Mathew Simpson is a 86 y.o. male with medical history significant of osteoarthritis, chronic atrial fibrillation, chronic back pain, glaucoma, hypertension, overweight who is brought to the emergency department due to decreased oral intake, fatigue, malaise associated with left lower extremity tenderness, edema and erythema.  He was febrile upon arrival.  He has been having a headache. He denied rhinorrhea, sore throat, wheezing or hemoptysis.  No chest pain, palpitations, diaphoresis, PND, orthopnea or pitting edema of the lower extremities.  No abdominal pain, nausea, emesis, diarrhea, constipation, melena or hematochezia.  No flank pain, dysuria, frequency or hematuria.  No polyuria, polydipsia, polyphagia or blurred vision.    ED course: Initial vital signs were temperature 100.7 F, pulse 86, respiration 24, BP 145/93 and O2 sat 97% on room air.  The patient received 1000 mL of LR bolus, acetaminophen 650 mg p.o. x1, KCl 40 mEq p.o., L 10 mEq IVPB x2, vancomycin and IV levofloxacin.   Lab work: His urinalysis show glucosuria 50 and proteinuria 30 mg/dL.  There was moderate hemoglobinuria but only 6-10 RBC per hpf on microscopic examination.  CBC showed a white count of 13.3, hemoglobin 13.7 g/dL platelets 161.  Normal PT, INR and PTT.  Lactic acid so far normal.  CMP showed a potassium of 2.5 mmol/L, but the rest of the electrolytes are normal when calcium is corrected.  Unremarkable renal function.  LFTs show total protein of 6.3 and albumin of 3.3 g/dL.  Alk phos and transaminases were normal.  Total bilirubin was 1.4 mg/dL.   Imaging: Two-view chest radiograph showed cardiomegaly without signs of pulmonary edema or consolidation.  Left foot x-ray with no radiographic evidence of osteomyelitis.  *Interim History Patient was  placed on IV levofloxacin and IV vancomycin but will de-escalate and stop the IV levofloxacin.  Patient was also complaining about some eye pain and some itching with some drainage so we will place him on oral antibiotics.  He is improving slowly but is a little somnolent and sleepy again but easily arousable.  Leg appears to be looking better but foot is a little bit more erythematous and still cellulitic appearing.  PT OT recommending SNF.  We will de-escalate antibiotics.   Assessment and Plan:  Cellulitis of Left Lower Extremity, improving  Cellulitis of Left Foot, improving slowly Sepsis due to cellulitis (HCC) -Admit to telemetry/inpatient. -Patient met sepsis criteria admission as he is febrile and tachypneic with a source of infection -DG foot showed "No fracture or dislocation. No radiographic evidence of osteomyelitis. Scattered degenerative arthritis greatest at the first MTP joint where it is advanced. Os peroneum" -Continued IV fluids and now stopped  -We will discontinue Levaquin and just continue vancomycin per pharmacy and now will de-escalate to IV Cefazolin now. -Follow-up blood culture and sensitivity -WBC went from 13.3 -> 12.6 -> 9.8 -> 9.0 -Blood cultures x2 today at 3 days -Urinalysis done and showed clear appearance with yellow color urine, 50 glucose, moderate hemoglobin, negative ketones, negative leukocytes, negative nitrites with no bacteria seen in urine culture showing no growth -Follow CBC and CMP in a.m.   Hypokalemia -Supplementing. -Patient's potassium is now 3.3 -Replete with p.o. KCl 40 mEQ BID x2 -Mag level was 2.1 -Continue to monitor and replete as necessary -Repeat CMP in a.m.   Essential Hypertension -Continue with  Irbesartan 150 mg daily -Continue to monitor blood pressures per protocol -Last blood pressure reading was 149/95   Chronic atrial fibrillation (HCC) -CHA2DS2-VASc Score of at least 5.. -Continue apixaban 5 mg p.o. twice  daily. -Currently on no rate control meds. -Optimize electrolytes.   Moderate protein malnutrition ([place]) -Nutrition Status: Nutrition Problem: Moderate Malnutrition Etiology: acute illness, poor appetite (LLE cellulitis) Signs/Symptoms: mild fat depletion, mild muscle depletion Interventions: MVI, Liberalize Diet   Hyperbilirubinemia -Mild and patient's T. bili has gone from 1.4 -> 1.0 -> 0.8 Negative monitor trend and repeat CMP in a.m.   Normocytic Anemia -Patient's hemoglobin/hematocrit went from 13.7/39.6 -> 12.1/35.2 -> 13.0/37.9 -> 12.9/38.5 -Check anemia panel in a.m. -Continue to monitor for signs and symptoms bleeding; no overt bleeding noted -Repeat CBC in a.m.   Thrombocytopenia -Mild and patient's platelet count went from 161 -> 143 -> 172 -> 196 -Continue monitor trend and repeat CBC in a.m.   Hypoalbuminemia -Patient's albumin level went from 3.3 -> 2.8 -> 2.9 -> 3.1 -Continue to monitor and trend and repeat CMP in the a.m.   Left eye conjunctivitis -Start ophthalmic antibiotic drops as well as analgesic drops; he is now on ciprofloxacin 0.3 ophthalmic drops 1 drop in left eye for every 4h while awake and ketorolac 0.5% ophthalmic solution 1 drop in left eye 4 times daily   DVT prophylaxis:  apixaban (ELIQUIS) tablet 5 mg    Code Status: Full Code Family Communication: No family present at bedside   Disposition Plan:  Level of care: Telemetry Status is: Inpatient Remains inpatient appropriate because: He continues have a cellulitis which is improving   Consultants:  None  Procedures:  None  Antimicrobials:  Anti-infectives (From admission, onward)    Start     Dose/Rate Route Frequency Ordered Stop   07/18/22 1400  vancomycin (VANCOREADY) IVPB 1750 mg/350 mL        1,750 mg 175 mL/hr over 120 Minutes Intravenous Every 24 hours 07/17/22 1341     07/18/22 1200  levofloxacin (LEVAQUIN) IVPB 500 mg  Status:  Discontinued        500 mg 100 mL/hr over  60 Minutes Intravenous Every 24 hours 07/17/22 1315 07/18/22 1832   07/17/22 1515  vancomycin (VANCOREADY) IVPB 750 mg/150 mL        750 mg 150 mL/hr over 60 Minutes Intravenous STAT 07/17/22 1505 07/17/22 1711   07/17/22 1345  vancomycin (VANCOREADY) IVPB 750 mg/150 mL  Status:  Discontinued        750 mg 150 mL/hr over 60 Minutes Intravenous NOW 07/17/22 1341 07/17/22 1505   07/17/22 1115  vancomycin (VANCOCIN) IVPB 1000 mg/200 mL premix        1,000 mg 200 mL/hr over 60 Minutes Intravenous  Once 07/17/22 1114 07/17/22 1337   07/17/22 1115  levofloxacin (LEVAQUIN) IVPB 750 mg        750 mg 100 mL/hr over 90 Minutes Intravenous  Once 07/17/22 1114 07/17/22 1333       Subjective: Seen and examined at bedside and he is a little somnolent and drowsy but is arousable.  Thinks he is doing a little bit better.  Has some abdominal discomfort but received some pain medication and is doing better now.  Has some leg and foot pain still but thinks is getting better.  No other concerns or complaints this time.  Objective: Vitals:   07/19/22 1341 07/19/22 2040 07/20/22 0440 07/20/22 1407  BP: (!) 146/86 (!) 141/114 (!) 153/90 (!) 149/95  Pulse: 93 85 83 87  Resp:  (!) 24 (!) 22 20  Temp: 97.9 F (36.6 C) 98.2 F (36.8 C) (!) 97.5 F (36.4 C) (!) 97.4 F (36.3 C)  TempSrc: Oral Oral Oral Oral  SpO2: 98% 98% 98% 98%  Weight:      Height:        Intake/Output Summary (Last 24 hours) at 07/20/2022 1514 Last data filed at 07/20/2022 1250 Gross per 24 hour  Intake 1041.59 ml  Output 1200 ml  Net -158.41 ml   Filed Weights   07/17/22 1155  Weight: 86.2 kg   Examination: Physical Exam:  Constitutional: WN/WD overweight chronically ill-appearing elderly Caucasian male in no acute distress and is resting Respiratory: Diminished to auscultation bilaterally with coarse breath sounds, no wheezing, rales, rhonchi or crackles. Normal respiratory effort and patient is not tachypenic. No  accessory muscle use.  Unlabored breathing Cardiovascular: RRR, no murmurs / rubs / gallops. S1 and S2 auscultated.  Has some swelling on his left leg and left foot Abdomen: Soft, non-tender, distended second by habitus. Bowel sounds positive.  GU: Deferred. Musculoskeletal: No clubbing / cyanosis of digits/nails. No joint deformity upper and lower extremities.  Skin: Left foot is warm and erythematous and painful to palpate Neurologic: He is little somnolent and drowsy when asleep but easily arousable and when he is awake fully CN 2-12 grossly intact with no focal deficits  Data Reviewed: I have personally reviewed following labs and imaging studies  CBC: Recent Labs  Lab 07/17/22 1114 07/18/22 0529 07/19/22 0555 07/20/22 0525  WBC 13.3* 12.6* 9.8 9.0  NEUTROABS 11.1*  --  6.6 5.4  HGB 13.7 12.1* 13.0 12.9*  HCT 39.6 35.2* 37.9* 38.5*  MCV 88.8 89.8 89.8 90.6  PLT 161 143* 172 916   Basic Metabolic Panel: Recent Labs  Lab 07/17/22 1114 07/18/22 0529 07/19/22 0555 07/20/22 0525  NA 143 143 141 141  K 2.5* 3.5 3.2* 3.3*  CL 110 112* 107 108  CO2 25 25 26 25   GLUCOSE 160* 153* 138* 148*  BUN 19 14 16 20   CREATININE 0.87 0.81 0.83 0.87  CALCIUM 8.1* 7.9* 8.0* 8.2*  MG 2.0  --  2.0 2.1  PHOS 3.1  --  2.5 2.9   GFR: Estimated Creatinine Clearance: 65.3 mL/min (by C-G formula based on SCr of 0.87 mg/dL). Liver Function Tests: Recent Labs  Lab 07/17/22 1114 07/18/22 0529 07/19/22 0555 07/20/22 0525  AST 34 25 23 22   ALT 38 30 27 30   ALKPHOS 67 63 66 71  BILITOT 1.4* 1.4* 1.0 0.8  PROT 6.3* 5.7* 6.0* 6.2*  ALBUMIN 3.3* 2.8* 2.9* 3.1*   No results for input(s): "LIPASE", "AMYLASE" in the last 168 hours. No results for input(s): "AMMONIA" in the last 168 hours. Coagulation Profile: Recent Labs  Lab 07/17/22 1114  INR 1.2   Cardiac Enzymes: No results for input(s): "CKTOTAL", "CKMB", "CKMBINDEX", "TROPONINI" in the last 168 hours. BNP (last 3 results) No  results for input(s): "PROBNP" in the last 8760 hours. HbA1C: No results for input(s): "HGBA1C" in the last 72 hours. CBG: No results for input(s): "GLUCAP" in the last 168 hours. Lipid Profile: No results for input(s): "CHOL", "HDL", "LDLCALC", "TRIG", "CHOLHDL", "LDLDIRECT" in the last 72 hours. Thyroid Function Tests: No results for input(s): "TSH", "T4TOTAL", "FREET4", "T3FREE", "THYROIDAB" in the last 72 hours. Anemia Panel: No results for input(s): "VITAMINB12", "FOLATE", "FERRITIN", "TIBC", "IRON", "RETICCTPCT" in the last 72 hours. Sepsis Labs: Recent Labs  Lab  07/17/22 1114 07/17/22 1558  LATICACIDVEN 1.3 1.5   Recent Results (from the past 240 hour(s))  Blood Culture (routine x 2)     Status: None (Preliminary result)   Collection Time: 07/17/22 11:15 AM   Specimen: BLOOD  Result Value Ref Range Status   Specimen Description   Final    BLOOD BLOOD LEFT FOREARM Performed at [facility] [address], [place], [place] [address]    Special Requests   Final    BOTTLES DRAWN AEROBIC AND ANAEROBIC Blood Culture results may not be optimal due to an inadequate volume of blood received in culture bottles Performed at [facility] [address] [place], [place] [address]    Culture   Final    NO GROWTH 3 DAYS Performed at [facility], [place] [address] [place], [place] [address]    Report Status PENDING  Incomplete  Resp Panel by RT-PCR (Flu A&B, Covid) Anterior Nasal Swab     Status: None   Collection Time: 07/17/22 12:17 PM   Specimen: Anterior Nasal Swab  Result Value Ref Range Status   SARS Coronavirus 2 by RT PCR NEGATIVE NEGATIVE Final    Comment: (NOTE) SARS-CoV-2 target nucleic acids are NOT DETECTED.  The SARS-CoV-2 RNA is generally detectable in upper respiratory specimens during the acute phase of infection. The lowest concentration of SARS-CoV-2 viral copies this assay can detect is 138 copies/mL. A negative  result does not preclude SARS-Cov-2 infection and should not be used as the sole basis for treatment or other patient management decisions. A negative result may occur with  improper specimen collection/handling, submission of specimen other than nasopharyngeal swab, presence of viral mutation(s) within the areas targeted by this assay, and inadequate number of viral copies(<138 copies/mL). A negative result must be combined with clinical observations, patient history, and epidemiological information. The expected result is Negative.  Fact Sheet for Patients:  EntrepreneurPulse.com.au  Fact Sheet for Healthcare Providers:  IncredibleEmployment.be  This test is no t yet approved or cleared by the [place] FDA and  has been authorized for detection and/or diagnosis of SARS-CoV-2 by FDA under an Emergency Use Authorization (EUA). This EUA will remain  in effect (meaning this test can be used) for the duration of the COVID-19 declaration under Section 564(b)(1) of the Act, 21 U.S.C.section 360bbb-3(b)(1), unless the authorization is terminated  or revoked sooner.       Influenza A by PCR NEGATIVE NEGATIVE Final   Influenza B by PCR NEGATIVE NEGATIVE Final    Comment: (NOTE) The Xpert Xpress SARS-CoV-2/FLU/RSV plus assay is intended as an aid in the diagnosis of influenza from Nasopharyngeal swab specimens and should not be used as a sole basis for treatment. Nasal washings and aspirates are unacceptable for Xpert Xpress SARS-CoV-2/FLU/RSV testing.  Fact Sheet for Patients: EntrepreneurPulse.com.au  Fact Sheet for Healthcare Providers: IncredibleEmployment.be  This test is not yet approved or cleared by the [place] FDA and has been authorized for detection and/or diagnosis of SARS-CoV-2 by FDA under an Emergency Use Authorization (EUA). This EUA will remain in effect (meaning this test can be used)  for the duration of the COVID-19 declaration under Section 564(b)(1) of the Act, 21 U.S.C. section 360bbb-3(b)(1), unless the authorization is terminated or revoked.  Performed at [facility], [place] [address], [place], [place] [address]   Urine Culture     Status: None   Collection Time: 07/17/22  4:05 PM   Specimen: In/Out  Cath Urine  Result Value Ref Range Status   Specimen Description   Final    IN/OUT CATH URINE Performed at [facility] [address] [place], [place] [address]    Special Requests   Final    NONE Performed at [facility], [place] [address], [place], [place] [address]    Culture   Final    NO GROWTH Performed at [facility], [place] [address] [place], [place] [address]    Report Status 07/19/2022 FINAL  Final  Blood Culture (routine x 2)     Status: None (Preliminary result)   Collection Time: 07/17/22  5:42 PM   Specimen: BLOOD  Result Value Ref Range Status   Specimen Description   Final    BLOOD RIGHT ANTECUBITAL Performed at [facility] [address], [place], [place] [address]    Special Requests   Final    BOTTLES DRAWN AEROBIC ONLY Blood Culture adequate volume Performed at [facility] [address], [place], [place] [address]    Culture   Final    NO GROWTH 3 DAYS Performed at [facility], [place] [address] [place],  [address]    Report Status PENDING  Incomplete    Radiology Studies: No results found.  Scheduled Meds:  apixaban  5 mg Oral BID   calcium carbonate  1 tablet Oral Once   ciprofloxacin  1 drop Left Eye Q4H while awake   irbesartan  150 mg Oral Daily   ketorolac  1 drop Left Eye QID   multivitamin with minerals  1 tablet Oral Daily   potassium chloride  40 mEq Oral BID   Continuous Infusions:  vancomycin 1,750 mg (07/20/22 1441)    LOS: 3 days   Raiford Noble, DO [facility] Available via Epic  secure chat 7am-7pm After these hours, please refer to coverage provider listed on [email or web address] 07/20/2022, 3:14 PM

## 2022-07-21 ENCOUNTER — Inpatient Hospital Stay (HOSPITAL_COMMUNITY): Payer: Medicare HMO

## 2022-07-21 DIAGNOSIS — L03116 Cellulitis of left lower limb: Secondary | ICD-10-CM | POA: Diagnosis not present

## 2022-07-21 DIAGNOSIS — I482 Chronic atrial fibrillation, unspecified: Secondary | ICD-10-CM | POA: Diagnosis not present

## 2022-07-21 DIAGNOSIS — L0390 Cellulitis, unspecified: Secondary | ICD-10-CM | POA: Diagnosis not present

## 2022-07-21 DIAGNOSIS — A419 Sepsis, unspecified organism: Secondary | ICD-10-CM | POA: Diagnosis not present

## 2022-07-21 LAB — CBC WITH DIFFERENTIAL/PLATELET
Abs Immature Granulocytes: 0.12 10*3/uL — ABNORMAL HIGH (ref 0.00–0.07)
Basophils Absolute: 0.1 10*3/uL (ref 0.0–0.1)
Basophils Relative: 1 %
Eosinophils Absolute: 0.3 10*3/uL (ref 0.0–0.5)
Eosinophils Relative: 3 %
HCT: 41.2 % (ref 39.0–52.0)
Hemoglobin: 14.1 g/dL (ref 13.0–17.0)
Immature Granulocytes: 1 %
Lymphocytes Relative: 21 %
Lymphs Abs: 2.3 10*3/uL (ref 0.7–4.0)
MCH: 30.5 pg (ref 26.0–34.0)
MCHC: 34.2 g/dL (ref 30.0–36.0)
MCV: 89 fL (ref 80.0–100.0)
Monocytes Absolute: 0.9 10*3/uL (ref 0.1–1.0)
Monocytes Relative: 8 %
Neutro Abs: 7.5 10*3/uL (ref 1.7–7.7)
Neutrophils Relative %: 66 %
Platelets: 141 10*3/uL — ABNORMAL LOW (ref 150–400)
RBC: 4.63 MIL/uL (ref 4.22–5.81)
RDW: 14.2 % (ref 11.5–15.5)
WBC: 11.2 10*3/uL — ABNORMAL HIGH (ref 4.0–10.5)
nRBC: 0 % (ref 0.0–0.2)

## 2022-07-21 LAB — COMPREHENSIVE METABOLIC PANEL
ALT: 33 U/L (ref 0–44)
AST: 34 U/L (ref 15–41)
Albumin: 3.1 g/dL — ABNORMAL LOW (ref 3.5–5.0)
Alkaline Phosphatase: 81 U/L (ref 38–126)
Anion gap: 8 (ref 5–15)
BUN: 18 mg/dL (ref 8–23)
CO2: 25 mmol/L (ref 22–32)
Calcium: 8.8 mg/dL — ABNORMAL LOW (ref 8.9–10.3)
Chloride: 105 mmol/L (ref 98–111)
Creatinine, Ser: 0.79 mg/dL (ref 0.61–1.24)
GFR, Estimated: >60 mL/min (ref >60)
Glucose, Bld: 122 mg/dL — ABNORMAL HIGH (ref 70–99)
Potassium: 4.6 mmol/L (ref 3.5–5.1)
Sodium: 138 mmol/L (ref 135–145)
Total Bilirubin: 1.2 mg/dL (ref 0.3–1.2)
Total Protein: 6.7 g/dL (ref 6.5–8.1)

## 2022-07-21 LAB — PHOSPHORUS: Phosphorus: 3.2 mg/dL (ref 2.5–4.6)

## 2022-07-21 LAB — MAGNESIUM: Magnesium: 1.9 mg/dL (ref 1.7–2.4)

## 2022-07-21 MED ORDER — HYDRALAZINE HCL 20 MG/ML IJ SOLN
5.0000 mg | INTRAMUSCULAR | Status: DC | PRN
  Administered 2022-07-21 – 2022-07-22 (×3): 5 mg via INTRAVENOUS
  Filled 2022-07-21 (×3): qty 1

## 2022-07-21 MED ORDER — PHENOL 1.4 % MT LIQD
1.0000 | OROMUCOSAL | Status: DC | PRN
  Administered 2022-07-21: 1 via OROMUCOSAL
  Filled 2022-07-21: qty 177

## 2022-07-21 MED ORDER — KETOROLAC TROMETHAMINE 15 MG/ML IJ SOLN
15.0000 mg | Freq: Once | INTRAMUSCULAR | Status: AC
  Administered 2022-07-21: 15 mg via INTRAVENOUS
  Filled 2022-07-21: qty 1

## 2022-07-21 MED ORDER — TRAMADOL HCL 50 MG PO TABS
50.0000 mg | ORAL_TABLET | Freq: Four times a day (QID) | ORAL | Status: DC | PRN
  Administered 2022-07-21 – 2022-07-23 (×5): 50 mg via ORAL
  Filled 2022-07-21 (×5): qty 1

## 2022-07-21 NOTE — TOC Initial Note (Signed)
Transition of Care [facility]) - Initial/Assessment Note    Patient Details  Name: Mathew Simpson MRN: [id number] Date of Birth: [birth year]  Transition of [facility]) CM/SW Contact:    Vassie Moselle, LCSW Phone Number: 07/21/2022, 10:14 AM  Clinical Narrative:                 Met with pt and confirmed plan for SNF placement. Pt states he may have gone to SNF in past however, was unsure of name of facility he may have been to. Pt is agreeable to SNF placement but, states he will only transfer if he is able to have a private room. CSW left voicemail for pt's granddaughter to discuss recommendations and preference for facility. Referrals for SNF have been faxed out and currently awaiting bed offers.   Expected Discharge Plan: Skilled Nursing Facility Barriers to Discharge: Continued Medical Work up   Patient Goals and CMS Choice Patient states their goals for this hospitalization and ongoing recovery are:: Go home   Choice offered to / list presented to : Patient  Expected Discharge Plan and Services Expected Discharge Plan: [place] In-house Referral: Clinical Social Work Discharge Planning Services: CM Consult Post Acute Care Choice: [place] Living arrangements for the past 2 months: Single Family Home                 DME Arranged: N/A DME Agency: NA                  Prior Living Arrangements/Services Living arrangements for the past 2 months: Single Family Home Lives with:: Self Patient language and need for interpreter reviewed:: Yes Do you feel safe going back to the place where you live?: Yes      Need for Family Participation in Patient Care: Yes (Comment) Care giver support system in place?: Yes (comment) Current home services: DME Criminal Activity/Legal Involvement Pertinent to Current Situation/Hospitalization: No - Comment as needed  Activities of Daily Living Home Assistive Devices/Equipment: Eyeglasses ADL Screening  (condition at time of admission) Patient's cognitive ability adequate to safely complete daily activities?: No Is the patient deaf or have difficulty hearing?: Yes Does the patient have difficulty seeing, even when wearing glasses/contacts?: No Does the patient have difficulty concentrating, remembering, or making decisions?: Yes (for the last few days) Patient able to express need for assistance with ADLs?: Yes Does the patient have difficulty dressing or bathing?: No Independently performs ADLs?: No Communication: Independent Dressing (OT): Independent Grooming: Independent Feeding: Independent Bathing: Independent Toileting: Needs assistance Is this a change from baseline?: Change from baseline, expected to last >3days In/Out Bed: Needs assistance Is this a change from baseline?: Change from baseline, expected to last >3 days Walks in Home: Needs assistance Is this a change from baseline?: Change from baseline, expected to last >3 days Does the patient have difficulty walking or climbing stairs?: Yes Weakness of Legs: Both Weakness of Arms/Hands: Both  Permission Sought/Granted Permission sought to share information with : Facility Sport and exercise psychologist, Family Supports Permission granted to share information with : Yes, Verbal Permission Granted  Share Information with NAME: Arman Bogus     Permission granted to share info w Relationship: Granddaughter  Permission granted to share info w Contact Information: [phone]  Emotional Assessment Appearance:: Appears stated age Attitude/Demeanor/Rapport: Engaged Affect (typically observed): Irritable Orientation: : Oriented to Self, Oriented to Place, Oriented to  Time, Oriented to Situation Alcohol / Substance Use: Not Applicable Psych Involvement: No (comment)  Admission  diagnosis:  Cellulitis of left foot [Z64.838] Cellulitis, unspecified cellulitis site [L03.90] Sepsis, due to unspecified organism, unspecified whether  acute organ dysfunction present [facility]) [A41.9] Patient Active Problem List   Diagnosis Date Noted   Malnutrition of moderate degree 07/18/2022   Cellulitis of left foot 07/17/2022   Sepsis due to cellulitis ([place]) 07/17/2022   Chronic atrial fibrillation ([place]) 07/17/2022   Hypokalemia 07/17/2022   Mild protein malnutrition ([place]) 07/17/2022   Hyperbilirubinemia 07/17/2022   Primary osteoarthritis of right knee 03/12/2020   Cellulitis of left lower extremity    Septic joint of left knee joint ([place]) 09/15/2019   Acute blood loss anemia 09/15/2019   Essential hypertension 09/15/2019   OA (osteoarthritis) of knee 09/12/2019   PCP:  Secundino Ginger, PA-C Pharmacy:   [id number] [place] [address] [id number]: [phone]: [phone] [place]Phone: [phone]: [phone]    Social Determinants of Health (SDOH) Interventions    Readmission Risk Interventions    07/21/2022   10:11 AM  Readmission Risk Prevention Plan  Post Dischage Appt Complete  Medication Screening Complete  Transportation Screening Complete

## 2022-07-21 NOTE — Progress Notes (Signed)
PROGRESS NOTE    Mathew Simpson  XBL:[id number] DOB: [birth year] DOA: 07/17/2022 PCP: Secundino Ginger, PA-C   Brief Narrative:  HPI per Dr. Tennis Must on 07/17/22  HPI: Mathew Simpson is a 86 y.o. male with medical history significant of osteoarthritis, chronic atrial fibrillation, chronic back pain, glaucoma, hypertension, overweight who is brought to the emergency department due to decreased oral intake, fatigue, malaise associated with left lower extremity tenderness, edema and erythema.  He was febrile upon arrival.  He has been having a headache. He denied rhinorrhea, sore throat, wheezing or hemoptysis.  No chest pain, palpitations, diaphoresis, PND, orthopnea or pitting edema of the lower extremities.  No abdominal pain, nausea, emesis, diarrhea, constipation, melena or hematochezia.  No flank pain, dysuria, frequency or hematuria.  No polyuria, polydipsia, polyphagia or blurred vision.    ED course: Initial vital signs were temperature 100.7 F, pulse 86, respiration 24, BP 145/93 and O2 sat 97% on room air.  The patient received 1000 mL of LR bolus, acetaminophen 650 mg p.o. x1, KCl 40 mEq p.o., L 10 mEq IVPB x2, vancomycin and IV levofloxacin.   Lab work: His urinalysis show glucosuria 50 and proteinuria 30 mg/dL.  There was moderate hemoglobinuria but only 6-10 RBC per hpf on microscopic examination.  CBC showed a white count of 13.3, hemoglobin 13.7 g/dL platelets 161.  Normal PT, INR and PTT.  Lactic acid so far normal.  CMP showed a potassium of 2.5 mmol/L, but the rest of the electrolytes are normal when calcium is corrected.  Unremarkable renal function.  LFTs show total protein of 6.3 and albumin of 3.3 g/dL.  Alk phos and transaminases were normal.  Total bilirubin was 1.4 mg/dL.   Imaging: Two-view chest radiograph showed cardiomegaly without signs of pulmonary edema or consolidation.  Left foot x-ray with no radiographic evidence of osteomyelitis.  *Interim History Patient was  placed on IV levofloxacin and IV vancomycin but will de-escalate and stop the IV levofloxacin.  Patient was also complaining about some eye pain and some itching with some drainage so we will place him on oral antibiotics.  He is improving slowly and Leg and foot appear to be improving slowly.  PT OT recommending SNF.  We will de-escalate antibiotics.  Patient was complaining about a severe headache today so we will get a CT scan of his head and give him some Toradol and see if this alleviates his headache.  TOC assisting with disposition and SNF placement and patient has been faxed out and currently awaiting bed offers and insurance authorization.   Assessment and Plan:  Cellulitis of Left Lower Extremity, improving  Cellulitis of Left Foot, improving slowly Sepsis due to cellulitis (HCC) -Admit to telemetry/inpatient. -Patient met sepsis criteria admission as he is febrile and tachypneic with a source of infection -DG foot showed "No fracture or dislocation. No radiographic evidence of osteomyelitis. Scattered degenerative arthritis greatest at the first MTP joint where it is advanced. Os peroneum" -Continued IV fluids and now stopped  -We will discontinue Levaquin and just continue vancomycin per pharmacy and now will de-escalate to IV Cefazolin now. -Follow-up blood culture and sensitivity -WBC went from 13.3 -> 12.6 -> 9.8 -> 9.0 but has now slightly worsened to 11.2 -Blood cultures x2 today at 4 days -Urinalysis done and showed clear appearance with yellow color urine, 50 glucose, moderate hemoglobin, negative ketones, negative leukocytes, negative nitrites with no bacteria seen in urine culture showing no growth -Follow CBC and CMP in  a.m.   Hypokalemia -Supplementing. -Patient's potassium is now gone from 3.3 -> 4.6 -Mag level is now 1.9 -Continue to monitor and replete as necessary -Repeat CMP in a.m.   Essential Hypertension -Continue with Irbesartan 150 mg daily -Continue to  monitor blood pressures per protocol -Last blood pressure reading was 149/95   Chronic atrial fibrillation (HCC) -CHA2DS2-VASc Score of at least 5.. -Continue apixaban 5 mg p.o. twice daily. -Currently on no rate control meds. -Optimize electrolytes.   Moderate protein malnutrition ([place]) -Nutrition Status: Nutrition Problem: Moderate Malnutrition Etiology: acute illness, poor appetite (LLE cellulitis) Signs/Symptoms: mild fat depletion, mild muscle depletion Interventions: MVI, Liberalize Diet   Hyperbilirubinemia -Mild and patient's T. bili has gone from 1.4 -> 1.0 -> 0.8 Negative monitor trend and repeat CMP in a.m.   Normocytic Anemia -Patient's hemoglobin/hematocrit went from 13.7/39.6 -> 12.1/35.2 -> 13.0/37.9 -> 12.9/38.5 -Check anemia panel in a.m. -Continue to monitor for signs and symptoms bleeding; no overt bleeding noted -Repeat CBC in a.m.   Thrombocytopenia -Mild and patient's platelet count went from 161 -> 143 -> 172 -> 196 -Continue monitor trend and repeat CBC in a.m.   Hypoalbuminemia -Patient's albumin level went from 3.3 -> 2.8 -> 2.9 -> 3.1 -Continue to monitor and trend and repeat CMP in the a.m.   Left eye conjunctivitis -Start ophthalmic antibiotic drops as well as analgesic drops; he is now on ciprofloxacin 0.3 ophthalmic drops 1 drop in left eye for every 4h while awake and ketorolac 0.5% ophthalmic solution 1 drop in left eye 4 times daily  Headache -Persistent and Patient states it is worsened and showed "No evidence of acute intracranial abnormality. Generalized intracranial dolichoectasia." -Check Head CT w/o Contrast  -Try Ketorolac 15 mg x1 -Also add Tramadol 50 mg q6hprn Moderate Pain -C/w Acetaminophen   DVT prophylaxis:  apixaban (ELIQUIS) tablet 5 mg    Code Status: Full Code Family Communication: No family present at bedside   Disposition Plan:  Level of care: Telemetry Status is: Inpatient Remains inpatient appropriate because:  His cellulitis in his foot is improving but will need SNF for further rehabilitation processes and further work-up for his headache.   Consultants:  None  Procedures:  None  Antimicrobials:  Anti-infectives (From admission, onward)    Start     Dose/Rate Route Frequency Ordered Stop   07/20/22 2200  ceFAZolin (ANCEF) IVPB 2g/100 mL premix        2 g 200 mL/hr over 30 Minutes Intravenous Every 8 hours 07/20/22 1534     07/18/22 1400  vancomycin (VANCOREADY) IVPB 1750 mg/350 mL  Status:  Discontinued        1,750 mg 175 mL/hr over 120 Minutes Intravenous Every 24 hours 07/17/22 1341 07/20/22 1534   07/18/22 1200  levofloxacin (LEVAQUIN) IVPB 500 mg  Status:  Discontinued        500 mg 100 mL/hr over 60 Minutes Intravenous Every 24 hours 07/17/22 1315 07/18/22 1832   07/17/22 1515  vancomycin (VANCOREADY) IVPB 750 mg/150 mL        750 mg 150 mL/hr over 60 Minutes Intravenous STAT 07/17/22 1505 07/17/22 1711   07/17/22 1345  vancomycin (VANCOREADY) IVPB 750 mg/150 mL  Status:  Discontinued        750 mg 150 mL/hr over 60 Minutes Intravenous NOW 07/17/22 1341 07/17/22 1505   07/17/22 1115  vancomycin (VANCOCIN) IVPB 1000 mg/200 mL premix        1,000 mg 200 mL/hr over 60 Minutes Intravenous  Once  07/17/22 1114 07/17/22 1337   07/17/22 1115  levofloxacin (LEVAQUIN) IVPB 750 mg        750 mg 100 mL/hr over 90 Minutes Intravenous  Once 07/17/22 1114 07/17/22 1333       Subjective: Seen and examined at bedside and he is complaining about a significant headache that is persistent.  Thinks his foot is doing okay but his main complaint is a headache.  Denies any chest pain or shortness breath.  No other concerns or complaints this time.  Objective: Vitals:   07/20/22 2045 07/21/22 0527 07/21/22 0648 07/21/22 1329  BP: (!) 148/89 (!) 170/99 (!) 164/102 140/74  Pulse: 89 79 85 88  Resp:    18  Temp: 98.1 F (36.7 C) 98 F (36.7 C)  97.9 F (36.6 C)  TempSrc: Oral Oral  Oral  SpO2:  97% 97%  95%  Weight:      Height:        Intake/Output Summary (Last 24 hours) at 07/21/2022 1601 Last data filed at 07/21/2022 1351 Gross per 24 hour  Intake 360 ml  Output 900 ml  Net -540 ml   Filed Weights   07/17/22 1155  Weight: 86.2 kg   Examination: Physical Exam:  Constitutional: WN/WD overweight chronically ill-appearing elderly Caucasian male currently appears a little uncomfortable complaining about a headache.   Respiratory: Diminished to auscultation bilaterally with coarse breath sounds, no wheezing, rales, rhonchi or crackles. Normal respiratory effort and patient is not tachypenic. No accessory muscle use.  Unlabored breathing Cardiovascular: RRR, no murmurs / rubs / gallops. S1 and S2 auscultated.  He has some swelling in his left foot and erythema which is improving Abdomen: Soft, non-tender, non-distended. Bowel sounds positive.  GU: Deferred. Musculoskeletal: No clubbing / cyanosis of digits/nails. No joint deformity upper and lower extremities.  Skin: Left foot is warm and erythematous and painful to palpate Neurologic: CN 2-12 grossly intact with no focal deficits. Romberg sign and  cerebellar reflexes not assessed.  Psychiatric: Normal judgment and insight. Alert and oriented x 2.  Slightly anxious mood  Data Reviewed: I have personally reviewed following labs and imaging studies  CBC: Recent Labs  Lab 07/17/22 1114 07/18/22 0529 07/19/22 0555 07/20/22 0525 07/21/22 0509  WBC 13.3* 12.6* 9.8 9.0 11.2*  NEUTROABS 11.1*  --  6.6 5.4 7.5  HGB 13.7 12.1* 13.0 12.9* 14.1  HCT 39.6 35.2* 37.9* 38.5* 41.2  MCV 88.8 89.8 89.8 90.6 89.0  PLT 161 143* 172 196 086*   Basic Metabolic Panel: Recent Labs  Lab 07/17/22 1114 07/18/22 0529 07/19/22 0555 07/20/22 0525 07/21/22 0509  NA 143 143 141 141 138  K 2.5* 3.5 3.2* 3.3* 4.6  CL 110 112* 107 108 105  CO2 _0 GLUCOSE 160* 153* 138* 148* 122*  BUN _1 CREATININE 0.87 0.81  0.83 0.87 0.79  CALCIUM 8.1* 7.9* 8.0* 8.2* 8.8*  MG 2.0  --  2.0 2.1 1.9  PHOS 3.1  --  2.5 2.9 3.2   GFR: Estimated Creatinine Clearance: 71 mL/min (by C-G formula based on SCr of 0.79 mg/dL). Liver Function Tests: Recent Labs  Lab 07/17/22 1114 07/18/22 0529 07/19/22 0555 07/20/22 0525 07/21/22 0509  AST 34 _2 34  ALT 38 _3 33  ALKPHOS 67 63 66 71 81  BILITOT 1.4* 1.4* 1.0 0.8 1.2  PROT 6.3* 5.7* 6.0* 6.2* 6.7  ALBUMIN 3.3* 2.8* 2.9* 3.1* 3.1*  No results for input(s): "LIPASE", "AMYLASE" in the last 168 hours. No results for input(s): "AMMONIA" in the last 168 hours. Coagulation Profile: Recent Labs  Lab 07/17/22 1114  INR 1.2   Cardiac Enzymes: No results for input(s): "CKTOTAL", "CKMB", "CKMBINDEX", "TROPONINI" in the last 168 hours. BNP (last 3 results) No results for input(s): "PROBNP" in the last 8760 hours. HbA1C: No results for input(s): "HGBA1C" in the last 72 hours. CBG: No results for input(s): "GLUCAP" in the last 168 hours. Lipid Profile: No results for input(s): "CHOL", "HDL", "LDLCALC", "TRIG", "CHOLHDL", "LDLDIRECT" in the last 72 hours. Thyroid Function Tests: No results for input(s): "TSH", "T4TOTAL", "FREET4", "T3FREE", "THYROIDAB" in the last 72 hours. Anemia Panel: No results for input(s): "VITAMINB12", "FOLATE", "FERRITIN", "TIBC", "IRON", "RETICCTPCT" in the last 72 hours. Sepsis Labs: Recent Labs  Lab 07/17/22 1114 07/17/22 1558  LATICACIDVEN 1.3 1.5    Recent Results (from the past 240 hour(s))  Blood Culture (routine x 2)     Status: None (Preliminary result)   Collection Time: 07/17/22 11:15 AM   Specimen: BLOOD  Result Value Ref Range Status   Specimen Description   Final    BLOOD BLOOD LEFT FOREARM Performed at [facility], [place] [address], [place], [place] [address]    Special Requests   Final    BOTTLES DRAWN AEROBIC AND ANAEROBIC Blood Culture results may not be optimal due to an  inadequate volume of blood received in culture bottles Performed at [place] [address] [place], [place] [address]    Culture   Final    NO GROWTH 4 DAYS Performed at [facility], [place] [address], [place], [place] [address]    Report Status PENDING  Incomplete  Resp Panel by RT-PCR (Flu A&B, Covid) Anterior Nasal Swab     Status: None   Collection Time: 07/17/22 12:17 PM   Specimen: Anterior Nasal Swab  Result Value Ref Range Status   SARS Coronavirus 2 by RT PCR NEGATIVE NEGATIVE Final    Comment: (NOTE) SARS-CoV-2 target nucleic acids are NOT DETECTED.  The SARS-CoV-2 RNA is generally detectable in upper respiratory specimens during the acute phase of infection. The lowest concentration of SARS-CoV-2 viral copies this assay can detect is 138 copies/mL. A negative result does not preclude SARS-Cov-2 infection and should not be used as the sole basis for treatment or other patient management decisions. A negative result may occur with  improper specimen collection/handling, submission of specimen other than nasopharyngeal swab, presence of viral mutation(s) within the areas targeted by this assay, and inadequate number of viral copies(<138 copies/mL). A negative result must be combined with clinical observations, patient history, and epidemiological information. The expected result is Negative.  Fact Sheet for Patients:  EntrepreneurPulse.com.au  Fact Sheet for Healthcare Providers:  IncredibleEmployment.be  This test is no t yet approved or cleared by the [place] FDA and  has been authorized for detection and/or diagnosis of SARS-CoV-2 by FDA under an Emergency Use Authorization (EUA). This EUA will remain  in effect (meaning this test can be used) for the duration of the COVID-19 declaration under Section 564(b)(1) of the Act, 21 U.S.C.section 360bbb-3(b)(1), unless the authorization is terminated   or revoked sooner.       Influenza A by PCR NEGATIVE NEGATIVE Final   Influenza B by PCR NEGATIVE NEGATIVE Final    Comment: (NOTE) The Xpert Xpress SARS-CoV-2/FLU/RSV plus assay is intended as an aid in the diagnosis of influenza  from Nasopharyngeal swab specimens and should not be used as a sole basis for treatment. Nasal washings and aspirates are unacceptable for Xpert Xpress SARS-CoV-2/FLU/RSV testing.  Fact Sheet for Patients: EntrepreneurPulse.com.au  Fact Sheet for Healthcare Providers: IncredibleEmployment.be  This test is not yet approved or cleared by the [place] FDA and has been authorized for detection and/or diagnosis of SARS-CoV-2 by FDA under an Emergency Use Authorization (EUA). This EUA will remain in effect (meaning this test can be used) for the duration of the COVID-19 declaration under Section 564(b)(1) of the Act, 21 U.S.C. section 360bbb-3(b)(1), unless the authorization is terminated or revoked.  Performed at [facility], [place] [address], [place], [place] [address]   Urine Culture     Status: None   Collection Time: 07/17/22  4:05 PM   Specimen: In/Out Cath Urine  Result Value Ref Range Status   Specimen Description   Final    IN/OUT CATH URINE Performed at [place] [address] [place], [place] [address]    Special Requests   Final    NONE Performed at [facility], [place] [address], [place], [place] [address]    Culture   Final    NO GROWTH Performed at [facility], [place] [address] [place], [place] [address]    Report Status 07/19/2022 FINAL  Final  Blood Culture (routine x 2)     Status: None (Preliminary result)   Collection Time: 07/17/22  5:42 PM   Specimen: BLOOD  Result Value Ref Range Status   Specimen Description   Final    BLOOD RIGHT ANTECUBITAL Performed at [place] [address]  [place], [place] [address]    Special Requests   Final    BOTTLES DRAWN AEROBIC ONLY Blood Culture adequate volume Performed at [place] [address], [place], [place] [address]    Culture   Final    NO GROWTH 4 DAYS Performed at [facility], [place] [address] [place], [place] [address]    Report Status PENDING  Incomplete     Radiology Studies: CT HEAD WO CONTRAST (5MM)  Result Date: 07/21/2022 CLINICAL DATA:  Headache for 5 days. EXAM: CT HEAD WITHOUT CONTRAST TECHNIQUE: Contiguous axial images were obtained from the base of the skull through the vertex without intravenous contrast. RADIATION DOSE REDUCTION: This exam was performed according to the departmental dose-optimization program which includes automated exposure control, adjustment of the mA and/or kV according to patient size and/or use of iterative reconstruction technique. COMPARISON:  None Available. FINDINGS: Brain: There is no evidence of an acute infarct, intracranial hemorrhage, mass, midline shift, or extra-axial fluid collection. Mild cerebral atrophy is within normal limits for age. Vascular: Generalized intracranial dolichoectasia, with the basilar artery measuring 1 cm in diameter. Atherosclerotic calcification. Skull: No fracture or suspicious osseous lesion. Sinuses/Orbits: Visualized paranasal sinuses and mastoid air cells are clear. Visualized orbits are unremarkable. Other: None. IMPRESSION: 1. No evidence of acute intracranial abnormality. 2. Generalized intracranial dolichoectasia. Electronically Signed   By: Logan Bores M.D.   On: 07/21/2022 15:21     Scheduled Meds:  apixaban  5 mg Oral BID   calcium carbonate  1 tablet Oral Once   ciprofloxacin  1 drop Left Eye Q4H while awake   irbesartan  150 mg Oral Daily   ketorolac  1 drop Left Eye QID   multivitamin with minerals  1 tablet Oral Daily   Continuous  Infusions:   ceFAZolin (ANCEF) IV 2 g (07/21/22 0515)    LOS: 4 days    Raiford Noble, DO [facility] Available via Epic secure chat 7am-7pm After these hours, please refer to coverage provider listed on [email or web address] 07/21/2022, 4:01 PM

## 2022-07-21 NOTE — NC FL2 (Signed)
[place] LEVEL OF CARE SCREENING TOOL     IDENTIFICATION  Patient Name: Mathew Simpson Birthdate: 15-May-1936 Sex: male Admission Date (Current Location): 07/17/2022  [facility] and [place] Number:  Herbalist and Address:  [facility],  [place], [place]      Provider Number: [id number]  Attending Physician Name and Address:  Kerney Elbe, DO  Relative Name and Phone Number:  Barnet Pall [phone]    Current Level of Care: Hospital Recommended Level of Care: [place] Prior Approval Number:    Date Approved/Denied:   PASRR Number: [id number]  Discharge Plan: SNF    Current Diagnoses: Patient Active Problem List   Diagnosis Date Noted   Malnutrition of moderate degree 07/18/2022   Cellulitis of left foot 07/17/2022   Sepsis due to cellulitis ([place]) 07/17/2022   Chronic atrial fibrillation ([place]) 07/17/2022   Hypokalemia 07/17/2022   Mild protein malnutrition ([place]) 07/17/2022   Hyperbilirubinemia 07/17/2022   Primary osteoarthritis of right knee 03/12/2020   Cellulitis of left lower extremity    Septic joint of left knee joint ([place]) 09/15/2019   Acute blood loss anemia 09/15/2019   Essential hypertension 09/15/2019   OA (osteoarthritis) of knee 09/12/2019    Orientation RESPIRATION BLADDER Height & Weight     Self, Time, Situation, Place  Normal Incontinent, External catheter Weight: 190 lb (86.2 kg) Height:  '5\' 10"'$  (177.8 cm)  BEHAVIORAL SYMPTOMS/MOOD NEUROLOGICAL BOWEL NUTRITION STATUS      Continent Diet (Regular)  AMBULATORY STATUS COMMUNICATION OF NEEDS Skin   Limited Assist Verbally Normal                       Personal Care Assistance Level of Assistance  Bathing, Feeding, Dressing Bathing Assistance: Maximum assistance Feeding assistance: Limited assistance Dressing Assistance: Maximum assistance     Functional Limitations Info  Sight, Hearing, Speech  Sight Info: Adequate Hearing Info: Impaired Speech Info: Adequate    SPECIAL CARE FACTORS FREQUENCY  PT (By licensed PT), OT (By licensed OT)     PT Frequency: 5x/wk OT Frequency: 5x/wk            Contractures Contractures Info: Not present    Additional Factors Info  Code Status, Allergies Code Status Info: FULL Allergies Info: Penicillins           Current Medications (07/21/2022):  This is the current hospital active medication list Current Facility-Administered Medications  Medication Dose Route Frequency Provider Last Rate Last Admin   acetaminophen (TYLENOL) tablet 650 mg  650 mg Oral Q6H PRN Reubin Milan, MD   650 mg at 07/21/22 7619   Or   acetaminophen (TYLENOL) suppository 650 mg  650 mg Rectal Q6H PRN Reubin Milan, MD       alum & mag hydroxide-simeth (MAALOX/MYLANTA) 200-200-20 MG/5ML suspension 30 mL  30 mL Oral Q4H PRN Reubin Milan, MD   30 mL at 07/20/22 0740   apixaban (ELIQUIS) tablet 5 mg  5 mg Oral BID Reubin Milan, MD   5 mg at 07/21/22 5093   calcium carbonate (TUMS - dosed in mg elemental calcium) chewable tablet 200 mg of elemental calcium  1 tablet Oral Once Jani Gravel, MD       ceFAZolin (ANCEF) IVPB 2g/100 mL premix  2 g Intravenous [address] [place], DO 200 mL/hr at 07/21/22 0515 2 g at 07/21/22 0515   ciprofloxacin (CILOXAN) 0.3 %  ophthalmic solution 1 drop  1 drop Left Eye Q4H while awake Raiford Noble [place], DO   1 drop at 07/21/22 5883   hydrALAZINE (APRESOLINE) injection 5 mg  5 mg Intravenous Q4H PRN Rise Patience, MD   5 mg at 07/21/22 0547   irbesartan (AVAPRO) tablet 150 mg  150 mg Oral Daily Reubin Milan, MD   150 mg at 07/21/22 2549   ketorolac (ACULAR) 0.5 % ophthalmic solution 1 drop  1 drop Left Eye QID Raiford Noble [place], [place]   1 drop at 07/21/22 8264   multivitamin with minerals tablet 1 tablet  1 tablet Oral Daily Raiford Noble [place], [place]   1 tablet at 07/21/22 1583   ondansetron  (ZOFRAN) tablet 4 mg  4 mg Oral Q6H PRN Reubin Milan, MD       Or   ondansetron [facility]) injection 4 mg  4 mg Intravenous Q6H PRN Reubin Milan, MD   4 mg at 07/20/22 0740   phenol (CHLORASEPTIC) mouth spray 1 spray  1 spray Mouth/Throat PRN Raiford Noble [place], DO   1 spray at 07/21/22 0403     Discharge Medications: Please see discharge summary for a list of discharge medications.  Relevant Imaging Results:  Relevant Lab Results:   Additional Information SSN: [id number]  Currie Wheeling, LCSW

## 2022-07-22 DIAGNOSIS — I482 Chronic atrial fibrillation, unspecified: Secondary | ICD-10-CM | POA: Diagnosis not present

## 2022-07-22 DIAGNOSIS — L0390 Cellulitis, unspecified: Secondary | ICD-10-CM | POA: Diagnosis not present

## 2022-07-22 DIAGNOSIS — L03116 Cellulitis of left lower limb: Secondary | ICD-10-CM | POA: Diagnosis not present

## 2022-07-22 DIAGNOSIS — A419 Sepsis, unspecified organism: Secondary | ICD-10-CM | POA: Diagnosis not present

## 2022-07-22 LAB — CBC WITH DIFFERENTIAL/PLATELET
Abs Immature Granulocytes: 0.1 10*3/uL — ABNORMAL HIGH (ref 0.00–0.07)
Basophils Absolute: 0 10*3/uL (ref 0.0–0.1)
Basophils Relative: 0 %
Eosinophils Absolute: 0.1 10*3/uL (ref 0.0–0.5)
Eosinophils Relative: 1 %
HCT: 41.1 % (ref 39.0–52.0)
Hemoglobin: 13.9 g/dL (ref 13.0–17.0)
Immature Granulocytes: 1 %
Lymphocytes Relative: 13 %
Lymphs Abs: 1.3 10*3/uL (ref 0.7–4.0)
MCH: 30.6 pg (ref 26.0–34.0)
MCHC: 33.8 g/dL (ref 30.0–36.0)
MCV: 90.5 fL (ref 80.0–100.0)
Monocytes Absolute: 0.9 10*3/uL (ref 0.1–1.0)
Monocytes Relative: 8 %
Neutro Abs: 8 10*3/uL — ABNORMAL HIGH (ref 1.7–7.7)
Neutrophils Relative %: 77 %
Platelets: 221 10*3/uL (ref 150–400)
RBC: 4.54 MIL/uL (ref 4.22–5.81)
RDW: 14.1 % (ref 11.5–15.5)
WBC: 10.5 10*3/uL (ref 4.0–10.5)
nRBC: 0 % (ref 0.0–0.2)

## 2022-07-22 LAB — COMPREHENSIVE METABOLIC PANEL
ALT: 26 U/L (ref 0–44)
AST: 28 U/L (ref 15–41)
Albumin: 3.1 g/dL — ABNORMAL LOW (ref 3.5–5.0)
Alkaline Phosphatase: 77 U/L (ref 38–126)
Anion gap: 9 (ref 5–15)
BUN: 17 mg/dL (ref 8–23)
CO2: 27 mmol/L (ref 22–32)
Calcium: 8.5 mg/dL — ABNORMAL LOW (ref 8.9–10.3)
Chloride: 100 mmol/L (ref 98–111)
Creatinine, Ser: 0.93 mg/dL (ref 0.61–1.24)
GFR, Estimated: >60 mL/min (ref >60)
Glucose, Bld: 104 mg/dL — ABNORMAL HIGH (ref 70–99)
Potassium: 4.2 mmol/L (ref 3.5–5.1)
Sodium: 136 mmol/L (ref 135–145)
Total Bilirubin: 0.8 mg/dL (ref 0.3–1.2)
Total Protein: 6.9 g/dL (ref 6.5–8.1)

## 2022-07-22 LAB — CULTURE, BLOOD (ROUTINE X 2)
Culture: NO GROWTH
Culture: NO GROWTH
Special Requests: ADEQUATE

## 2022-07-22 LAB — PHOSPHORUS: Phosphorus: 3.5 mg/dL (ref 2.5–4.6)

## 2022-07-22 LAB — MAGNESIUM: Magnesium: 2 mg/dL (ref 1.7–2.4)

## 2022-07-22 NOTE — Progress Notes (Signed)
PROGRESS NOTE    Mathew Simpson  WUX:[id number] DOB: Apr 03, 1936 DOA: 07/17/2022 PCP: Secundino Ginger, PA-C   Brief Narrative:  HPI per Dr. Tennis Must on 07/17/22  HPI: Mathew Simpson is a 86 y.o. male with medical history significant of osteoarthritis, chronic atrial fibrillation, chronic back pain, glaucoma, hypertension, overweight who is brought to the emergency department due to decreased oral intake, fatigue, malaise associated with left lower extremity tenderness, edema and erythema.  He was febrile upon arrival.  He has been having a headache. He denied rhinorrhea, sore throat, wheezing or hemoptysis.  No chest pain, palpitations, diaphoresis, PND, orthopnea or pitting edema of the lower extremities.  No abdominal pain, nausea, emesis, diarrhea, constipation, melena or hematochezia.  No flank pain, dysuria, frequency or hematuria.  No polyuria, polydipsia, polyphagia or blurred vision.    ED course: Initial vital signs were temperature 100.7 F, pulse 86, respiration 24, BP 145/93 and O2 sat 97% on room air.  The patient received 1000 mL of LR bolus, acetaminophen 650 mg p.o. x1, KCl 40 mEq p.o., L 10 mEq IVPB x2, vancomycin and IV levofloxacin.   Lab work: His urinalysis show glucosuria 50 and proteinuria 30 mg/dL.  There was moderate hemoglobinuria but only 6-10 RBC per hpf on microscopic examination.  CBC showed a white count of 13.3, hemoglobin 13.7 g/dL platelets 161.  Normal PT, INR and PTT.  Lactic acid so far normal.  CMP showed a potassium of 2.5 mmol/L, but the rest of the electrolytes are normal when calcium is corrected.  Unremarkable renal function.  LFTs show total protein of 6.3 and albumin of 3.3 g/dL.  Alk phos and transaminases were normal.  Total bilirubin was 1.4 mg/dL.   Imaging: Two-view chest radiograph showed cardiomegaly without signs of pulmonary edema or consolidation.  Left foot x-ray with no radiographic evidence of osteomyelitis.  *Interim History Patient was  placed on IV levofloxacin and IV vancomycin but will de-escalate and stop the IV levofloxacin.  Patient was also complaining about some eye pain and some itching with some drainage so we will place him on oral antibiotics.  He is improving slowly and Leg and foot appear to be improving slowly.  PT OT recommending SNF.  We will de-escalate antibiotics.  Patient was complaining about a severe headache today so we will get a CT scan of his head and give him some Toradol and see if this alleviates his headache.  TOC assisting with disposition and SNF placement and patient has been faxed out and currently awaiting bed offers and insurance authorization.  He is medically stable to be discharged now at this time and likely can be transitioned to oral antibiotics in the morning.   Assessment and Plan:  Cellulitis of Left Lower Extremity, improving  Cellulitis of Left Foot, improving slowly Sepsis due to cellulitis (HCC) -Admit to telemetry/inpatient. -Patient met sepsis criteria admission as he is febrile and tachypneic with a source of infection -DG foot showed "No fracture or dislocation. No radiographic evidence of osteomyelitis. Scattered degenerative arthritis greatest at the first MTP joint where it is advanced. Os peroneum" -Continued IV fluids and now stopped  -We will discontinue Levaquin and just continue vancomycin per pharmacy and now will de-escalate to IV Cefazolin now and can go to po in the AM -Follow-up blood culture and sensitivity -WBC went from 13.3 -> 12.6 -> 9.8 -> 9.0 but has now slightly worsened to 11.2 yesterday and is now improved 10.4 -Blood cultures x2 today at  5 days -Urinalysis done and showed clear appearance with yellow color urine, 50 glucose, moderate hemoglobin, negative ketones, negative leukocytes, negative nitrites with no bacteria seen in urine culture showing no growth -Follow CBC and CMP in a.m.   Hypokalemia -Supplementing. -Patient's potassium is now gone  from 3.3 -> 4.6 -> 4.2 -Mag level is now 1.9 -Continue to monitor and replete as necessary -Repeat CMP in a.m.   Essential Hypertension -Continue with Irbesartan 150 mg daily -Continue to monitor blood pressures per protocol -Last blood pressure reading was 116/75   Chronic atrial fibrillation (HCC) -CHA2DS2-VASc Score of at least 5.. -Continue apixaban 5 mg p.o. twice daily. -Currently on no rate control meds. -Optimize electrolytes.   Moderate protein malnutrition ([place]) -Nutrition Status: Nutrition Problem: Moderate Malnutrition Etiology: acute illness, poor appetite (LLE cellulitis) Signs/Symptoms: mild fat depletion, mild muscle depletion Interventions: MVI, Liberalize Diet   Hyperbilirubinemia -Mild and patient's T. bili has gone from 1.4 -> 1.0 -> 0.8 -> 1.2 -> 0.8 Negative monitor trend and repeat CMP in a.m.   Normocytic Anemia -Patient's hemoglobin/hematocrit went from 13.7/39.6 -> 12.1/35.2 -> 13.0/37.9 -> 12.9/38.5 -> 14.1/41.2 -> 13.9/41.1  -Check anemia panel in a.m. -Continue to monitor for signs and symptoms bleeding; no overt bleeding noted -Repeat CBC in a.m.   Thrombocytopenia -Mild and patient's platelet count went from 161 -> 143 -> 172 -> 196 -> 141 -> 221 -Continue monitor trend and repeat CBC in a.m.   Hypoalbuminemia -Patient's albumin level went from 3.3 -> 2.8 -> 2.9 -> 3.1 x2 -Continue to monitor and trend and repeat CMP in the a.m.   Left eye conjunctivitis -Start ophthalmic antibiotic drops as well as analgesic drops; he is now on ciprofloxacin 0.3 ophthalmic drops 1 drop in left eye for every 4h while awake and ketorolac 0.5% ophthalmic solution 1 drop in left eye 4 times daily   Headache, improving  -Persistent and Patient states it is worsened yesterday -Check Head CT w/o Contrast and showed "No evidence of acute intracranial abnormality. Generalized intracranial dolichoectasia." -Try Ketorolac 15 mg x1 -Also add Tramadol 50 mg q6hprn  Moderate Pain -C/w Acetaminophen and continue to Monitor   DVT prophylaxis:  apixaban (ELIQUIS) tablet 5 mg    Code Status: Full Code Family Communication: No family present at bedside   Disposition Plan:  Level of care: Telemetry Status is: Inpatient Remains inpatient appropriate because: He is slowly improving and needs SNF and insurance authorization is pending.   Consultants:  None  Procedures:  None  Antimicrobials:  Anti-infectives (From admission, onward)    Start     Dose/Rate Route Frequency Ordered Stop   07/20/22 2200  ceFAZolin (ANCEF) IVPB 2g/100 mL premix        2 g 200 mL/hr over 30 Minutes Intravenous Every 8 hours 07/20/22 1534     07/18/22 1400  vancomycin (VANCOREADY) IVPB 1750 mg/350 mL  Status:  Discontinued        1,750 mg 175 mL/hr over 120 Minutes Intravenous Every 24 hours 07/17/22 1341 07/20/22 1534   07/18/22 1200  levofloxacin (LEVAQUIN) IVPB 500 mg  Status:  Discontinued        500 mg 100 mL/hr over 60 Minutes Intravenous Every 24 hours 07/17/22 1315 07/18/22 1832   07/17/22 1515  vancomycin (VANCOREADY) IVPB 750 mg/150 mL        750 mg 150 mL/hr over 60 Minutes Intravenous STAT 07/17/22 1505 07/17/22 1711   07/17/22 1345  vancomycin (VANCOREADY) IVPB 750 mg/150 mL  Status:  Discontinued        750 mg 150 mL/hr over 60 Minutes Intravenous NOW 07/17/22 1341 07/17/22 1505   07/17/22 1115  vancomycin (VANCOCIN) IVPB 1000 mg/200 mL premix        1,000 mg 200 mL/hr over 60 Minutes Intravenous  Once 07/17/22 1114 07/17/22 1337   07/17/22 1115  levofloxacin (LEVAQUIN) IVPB 750 mg        750 mg 100 mL/hr over 90 Minutes Intravenous  Once 07/17/22 1114 07/17/22 1333       Subjective: Seen and examined at bedside and he thinks his headache is doing better.  Also thinks his foot is doing better.  No nausea or vomiting.  Feels okay.  No other concerns or complaints this time but states that he has pain on his foot whenever he stands up and tries to put  pressure on it.   Objective: Vitals:   07/21/22 2102 07/21/22 2241 07/22/22 0446 07/22/22 1243  BP: (!) 167/87 (!) 144/89 (!) 170/98 116/75  Pulse: 97 87 90 (!) 101  Resp: 20  (!) 21 18  Temp: 100.1 F (37.8 C)  99.8 F (37.7 C) 99.3 F (37.4 C)  TempSrc:    Oral  SpO2: 96%  97% 94%  Weight:      Height:        Intake/Output Summary (Last 24 hours) at 07/22/2022 1542 Last data filed at 07/22/2022 1500 Gross per 24 hour  Intake 500 ml  Output 1 ml  Net 499 ml   Filed Weights   07/17/22 1155  Weight: 86.2 kg   Examination: Physical Exam:  Constitutional: Well-nourished, well-developed overweight chronically ill-appearing elderly Caucasian male in no acute distress sitting in the chair bedside Respiratory: Diminished to auscultation bilaterally with coarse breath sounds, no wheezing, rales, rhonchi or crackles. Normal respiratory effort and patient is not tachypenic. No accessory muscle use. Unlabored breathing.  Cardiovascular: RRR, no murmurs / rubs / gallops. S1 and S2 auscultated.  Has some left foot erythema and swelling on the top of the foot Abdomen: Soft, non-tender, non-distended. No masses palpated. No appreciable hepatosplenomegaly. Bowel sounds positive.  GU: Deferred. Musculoskeletal: No clubbing / cyanosis of digits/nails. No joint deformity upper and lower extremities.  Skin: Left foot is warm and erythematous and is slightly painful to palpate Neurologic: CN 2-12 grossly intact with no focal deficits. Romberg sign and cerebellar reflexes not assessed.  Psychiatric: Normal judgment and insight. Alert and oriented x 2. Normal mood and appropriate affect.   Data Reviewed: I have personally reviewed following labs and imaging studies  CBC: Recent Labs  Lab 07/17/22 1114 07/18/22 0529 07/19/22 0555 07/20/22 0525 07/21/22 0509 07/22/22 0436  WBC 13.3* 12.6* 9.8 9.0 11.2* 10.5  NEUTROABS 11.1*  --  6.6 5.4 7.5 8.0*  HGB 13.7 12.1* 13.0 12.9* 14.1 13.9  HCT  39.6 35.2* 37.9* 38.5* 41.2 41.1  MCV 88.8 89.8 89.8 90.6 89.0 90.5  PLT 161 143* 172 196 141* 053   Basic Metabolic Panel: Recent Labs  Lab 07/17/22 1114 07/18/22 0529 07/19/22 0555 07/20/22 0525 07/21/22 0509 07/22/22 0436  NA 143 143 141 141 138 136  K 2.5* 3.5 3.2* 3.3* 4.6 4.2  CL 110 112* 107 108 105 100  CO2 25 25 26 25 25 27   GLUCOSE 160* 153* 138* 148* 122* 104*  BUN 19 14 16 20 18 17   CREATININE 0.87 0.81 0.83 0.87 0.79 0.93  CALCIUM 8.1* 7.9* 8.0* 8.2* 8.8* 8.5*  MG 2.0  --  2.0 2.1 1.9 2.0  PHOS 3.1  --  2.5 2.9 3.2 3.5   GFR: Estimated Creatinine Clearance: 61.1 mL/min (by C-G formula based on SCr of 0.93 mg/dL). Liver Function Tests: Recent Labs  Lab 07/18/22 0529 07/19/22 0555 07/20/22 0525 07/21/22 0509 07/22/22 0436  AST 25 23 22  34 28  ALT 30 27 30  33 26  ALKPHOS 63 66 71 81 77  BILITOT 1.4* 1.0 0.8 1.2 0.8  PROT 5.7* 6.0* 6.2* 6.7 6.9  ALBUMIN 2.8* 2.9* 3.1* 3.1* 3.1*   No results for input(s): "LIPASE", "AMYLASE" in the last 168 hours. No results for input(s): "AMMONIA" in the last 168 hours. Coagulation Profile: Recent Labs  Lab 07/17/22 1114  INR 1.2   Cardiac Enzymes: No results for input(s): "CKTOTAL", "CKMB", "CKMBINDEX", "TROPONINI" in the last 168 hours. BNP (last 3 results) No results for input(s): "PROBNP" in the last 8760 hours. HbA1C: No results for input(s): "HGBA1C" in the last 72 hours. CBG: No results for input(s): "GLUCAP" in the last 168 hours. Lipid Profile: No results for input(s): "CHOL", "HDL", "LDLCALC", "TRIG", "CHOLHDL", "LDLDIRECT" in the last 72 hours. Thyroid Function Tests: No results for input(s): "TSH", "T4TOTAL", "FREET4", "T3FREE", "THYROIDAB" in the last 72 hours. Anemia Panel: No results for input(s): "VITAMINB12", "FOLATE", "FERRITIN", "TIBC", "IRON", "RETICCTPCT" in the last 72 hours. Sepsis Labs: Recent Labs  Lab 07/17/22 1114 07/17/22 1558  LATICACIDVEN 1.3 1.5    Recent Results (from the  past 240 hour(s))  Blood Culture (routine x 2)     Status: None   Collection Time: 07/17/22 11:15 AM   Specimen: BLOOD  Result Value Ref Range Status   Specimen Description   Final    BLOOD BLOOD LEFT FOREARM Performed at [facility], [place] [address], [place], [place] [address]    Special Requests   Final    BOTTLES DRAWN AEROBIC AND ANAEROBIC Blood Culture results may not be optimal due to an inadequate volume of blood received in culture bottles Performed at [place] [address] [place], [place] [address]    Culture   Final    NO GROWTH 5 DAYS Performed at [facility], [place] [address], [place], [place] [address]    Report Status 07/22/2022 FINAL  Final  Resp Panel by RT-PCR (Flu A&B, Covid) Anterior Nasal Swab     Status: None   Collection Time: 07/17/22 12:17 PM   Specimen: Anterior Nasal Swab  Result Value Ref Range Status   SARS Coronavirus 2 by RT PCR NEGATIVE NEGATIVE Final    Comment: (NOTE) SARS-CoV-2 target nucleic acids are NOT DETECTED.  The SARS-CoV-2 RNA is generally detectable in upper respiratory specimens during the acute phase of infection. The lowest concentration of SARS-CoV-2 viral copies this assay can detect is 138 copies/mL. A negative result does not preclude SARS-Cov-2 infection and should not be used as the sole basis for treatment or other patient management decisions. A negative result may occur with  improper specimen collection/handling, submission of specimen other than nasopharyngeal swab, presence of viral mutation(s) within the areas targeted by this assay, and inadequate number of viral copies(<138 copies/mL). A negative result must be combined with clinical observations, patient history, and epidemiological information. The expected result is Negative.  Fact Sheet for Patients:  EntrepreneurPulse.com.au  Fact Sheet for Healthcare Providers:   IncredibleEmployment.be  This test is no t yet approved or cleared by the [place] FDA and  has been authorized for detection and/or diagnosis  of SARS-CoV-2 by FDA under an Emergency Use Authorization (EUA). This EUA will remain  in effect (meaning this test can be used) for the duration of the COVID-19 declaration under Section 564(b)(1) of the Act, 21 U.S.C.section 360bbb-3(b)(1), unless the authorization is terminated  or revoked sooner.       Influenza A by PCR NEGATIVE NEGATIVE Final   Influenza B by PCR NEGATIVE NEGATIVE Final    Comment: (NOTE) The Xpert Xpress SARS-CoV-2/FLU/RSV plus assay is intended as an aid in the diagnosis of influenza from Nasopharyngeal swab specimens and should not be used as a sole basis for treatment. Nasal washings and aspirates are unacceptable for Xpert Xpress SARS-CoV-2/FLU/RSV testing.  Fact Sheet for Patients: EntrepreneurPulse.com.au  Fact Sheet for Healthcare Providers: IncredibleEmployment.be  This test is not yet approved or cleared by the [place] FDA and has been authorized for detection and/or diagnosis of SARS-CoV-2 by FDA under an Emergency Use Authorization (EUA). This EUA will remain in effect (meaning this test can be used) for the duration of the COVID-19 declaration under Section 564(b)(1) of the Act, 21 U.S.C. section 360bbb-3(b)(1), unless the authorization is terminated or revoked.  Performed at [facility], [place] [address], [place], [place] [address]   Urine Culture     Status: None   Collection Time: 07/17/22  4:05 PM   Specimen: In/Out Cath Urine  Result Value Ref Range Status   Specimen Description   Final    IN/OUT CATH URINE Performed at [place] [address], [place], [place] [address]    Special Requests   Final    NONE Performed at [facility], [place] [address],  [place], [place] [address]    Culture   Final    NO GROWTH Performed at [facility], [place] [address], [place], [place] [address]    Report Status 07/19/2022 FINAL  Final  Blood Culture (routine x 2)     Status: None   Collection Time: 07/17/22  5:42 PM   Specimen: BLOOD  Result Value Ref Range Status   Specimen Description   Final    BLOOD RIGHT ANTECUBITAL Performed at [place] [address] [place], [place] [address]    Special Requests   Final    BOTTLES DRAWN AEROBIC ONLY Blood Culture adequate volume Performed at [place] [address], [place], [place] [address]    Culture   Final    NO GROWTH 5 DAYS Performed at [facility], [place] [address], [place],  [address]    Report Status 07/22/2022 FINAL  Final    Radiology Studies: CT HEAD WO CONTRAST (5MM)  Result Date: 07/21/2022 CLINICAL DATA:  Headache for 5 days. EXAM: CT HEAD WITHOUT CONTRAST TECHNIQUE: Contiguous axial images were obtained from the base of the skull through the vertex without intravenous contrast. RADIATION DOSE REDUCTION: This exam was performed according to the departmental dose-optimization program which includes automated exposure control, adjustment of the mA and/or kV according to patient size and/or use of iterative reconstruction technique. COMPARISON:  None Available. FINDINGS: Brain: There is no evidence of an acute infarct, intracranial hemorrhage, mass, midline shift, or extra-axial fluid collection. Mild cerebral atrophy is within normal limits for age. Vascular: Generalized intracranial dolichoectasia, with the basilar artery measuring 1 cm in diameter. Atherosclerotic calcification. Skull: No fracture or suspicious osseous lesion. Sinuses/Orbits: Visualized paranasal sinuses and mastoid air cells are clear. Visualized orbits are unremarkable. Other: None.  IMPRESSION: 1. No evidence of acute intracranial abnormality. 2. Generalized  intracranial dolichoectasia. Electronically Signed   By: Logan Bores M.D.   On: 07/21/2022 15:21     Scheduled Meds:  apixaban  5 mg Oral BID   calcium carbonate  1 tablet Oral Once   ciprofloxacin  1 drop Left Eye Q4H while awake   irbesartan  150 mg Oral Daily   ketorolac  1 drop Left Eye QID   multivitamin with minerals  1 tablet Oral Daily   Continuous Infusions:   ceFAZolin (ANCEF) IV 2 g (07/22/22 1320)    LOS: 5 days   Raiford Noble, DO [facility] Available via Epic secure chat 7am-7pm After these hours, please refer to coverage provider listed on [email or web address] 07/22/2022, 3:42 PM

## 2022-07-22 NOTE — Progress Notes (Signed)
Occupational Therapy Treatment Patient Details Name: Mathew Simpson MRN: [id number] DOB: 10-08-1936 Today's Date: 07/22/2022   History of present illness Mathew Simpson is a 86 y.o. male with medical history significant of osteoarthritis, chronic atrial fibrillation, chronic back pain, glaucoma, hypertension, overweight who is brought to the emergency department due to decreased oral intake, fatigue, malaise associated with left lower extremity tenderness, edema and erythema.  He was febrile upon arrival.  He has been having a headache. Found to have sepsis due to L LE cellulitis.   OT comments  OT treatment session with focus on functional transfers and therapeutic activity in prep for ADLs. Completes sit to stand x7 reps with cues for hand placement from EOB and low recliner. Denies increased pain in LLE. Requires increased coaxing/encouragement for participation. Patient continues to require set-up A for UB ADLs and Min guard-Min A for LB ADLs and would benefit from continued acute OT services in prep for safe d/c to next level of care. D/c disposition remains appropriate given continued deficits.    Recommendations for follow up therapy are one component of a multi-disciplinary discharge planning process, led by the attending physician.  Recommendations may be updated based on patient status, additional functional criteria and insurance authorization.    Follow Up Recommendations  Skilled nursing-short term rehab (<3 hours/day)    Assistance Recommended at Discharge Frequent or constant Supervision/Assistance  Patient can return home with the following  A little help with walking and/or transfers;A lot of help with bathing/dressing/bathroom;Assistance with cooking/housework;Help with stairs or ramp for entrance;Direct supervision/assist for financial management;Direct supervision/assist for medications management   Equipment Recommendations  Other (comment) (Defer to next level of care.)     Recommendations for Other Services      Precautions / Restrictions Precautions Precautions: Fall Restrictions Weight Bearing Restrictions: No       Mobility Bed Mobility Overal bed mobility: Needs Assistance Bed Mobility: Supine to Sit     Supine to sit: Min guard, HOB elevated     General bed mobility comments: Cues for hand placement and sequencing. Min guard at trunk initially.    Transfers Overall transfer level: Needs assistance Equipment used: Rolling walker (2 wheels) Transfers: Sit to/from Stand, Bed to chair/wheelchair/BSC Sit to Stand: Min guard Stand pivot transfers: Min guard         General transfer comment: Min guard for sit to stand from EOB positioned in lowest setting and for stand-pivot to recliner with cues for walker management and for safe hand placement.     Balance Overall balance assessment: Needs assistance Sitting-balance support: No upper extremity supported, Feet supported Sitting balance-Leahy Scale: Fair     Standing balance support: Bilateral upper extremity supported, During functional activity, Reliant on assistive device for balance Standing balance-Leahy Scale: Fair                             ADL either performed or assessed with clinical judgement   ADL Overall ADL's : Needs assistance/impaired                         Toilet Transfer: Min guard;Rolling walker (2 wheels) Toilet Transfer Details (indicate cue type and reason): Simulated with transfer to recliner with Min guard and use of RW.           General ADL Comments: Completed bathing at bedlevel with NT prior to this writers entry.    Extremity/Trunk  Assessment              Vision       Perception     Praxis      Cognition Arousal/Alertness: Awake/alert Behavior During Therapy: WFL for tasks assessed/performed Overall Cognitive Status: Impaired/Different from baseline Area of Impairment: Orientation                  Orientation Level:  (Not formally assessed this date)             General Comments: Confused but participatory with increased coaxing/encouragement        Exercises      Shoulder Instructions       General Comments VSS on RA    Pertinent Vitals/ Pain       Pain Assessment Pain Assessment: Faces Faces Pain Scale: Hurts a little bit Pain Location: headache Pain Descriptors / Indicators: Aching Pain Intervention(s): Monitored during session, Premedicated before session  Home Living                                          Prior Functioning/Environment              Frequency  Min 2X/week        Progress Toward Goals  OT Goals(current goals can now be found in the care plan section)  Progress towards OT goals: Progressing toward goals  Acute Rehab OT Goals Patient Stated Goal: Agreeable to SNF rehab OT Goal Formulation: With patient Time For Goal Achievement: 08/02/22 Potential to Achieve Goals: Good ADL Goals Pt Will Perform Lower Body Dressing: with supervision;sit to/from stand Pt Will Transfer to Toilet: with supervision;ambulating;regular height toilet Pt Will Perform Toileting - Clothing Manipulation and hygiene: with supervision;sit to/from stand Additional ADL Goal #1: Patient will perform 10 min functional activity or exercise activity as evidence of improving activity tolerance  Plan Discharge plan remains appropriate;Frequency remains appropriate    Co-evaluation                 AM-PAC OT "6 Clicks" Daily Activity     Outcome Measure   Help from another person eating meals?: A Little Help from another person taking care of personal grooming?: A Little Help from another person toileting, which includes using toliet, bedpan, or urinal?: A Lot Help from another person bathing (including washing, rinsing, drying)?: A Lot Help from another person to put on and taking off regular upper body clothing?: A Little Help  from another person to put on and taking off regular lower body clothing?: A Lot 6 Click Score: 15    End of Session Equipment Utilized During Treatment: Rolling walker (2 wheels);Gait belt  OT Visit Diagnosis: Muscle weakness (generalized) (M62.81)   Activity Tolerance Patient tolerated treatment well   Patient Left in chair;with call bell/phone within reach;with chair alarm set   Nurse Communication Mobility status        Time: 5009-3818 OT Time Calculation (min): 17 min  Charges: OT General Charges $OT Visit: 1 Visit OT Treatments $Therapeutic Activity: 8-22 mins  Bastien Strawser H. OTR/L Supplemental OT, Department of rehab services [phone]  Izear Pine R H. 07/22/2022, 10:49 AM

## 2022-07-22 NOTE — TOC Progression Note (Signed)
Transition of Care [facility]) - Progression Note    Patient Details  Name: LUKKA BLACK MRN: [id number] Date of Birth: May 09, 1936  Transition of Care [facility]) CM/SW Contact  Purcell Mouton, RN Phone Number: 07/22/2022, 10:50 AM  Clinical Narrative:    Pt's granddaughter Caryl Pina called, family asked for pt to go to [facility] in [place]. FL2 was sent to [facility] and a call was made to [facility]. Bed offer was given to pt. Will start Insurance auth.    Expected Discharge Plan: [place] Barriers to Discharge: Continued Medical Work up  Expected Discharge Plan and Services Expected Discharge Plan: [place] In-house Referral: Clinical Social Work Discharge Planning Services: CM Consult Post Acute Care Choice: [place] Living arrangements for the past 2 months: Single Family Home                 DME Arranged: N/A DME Agency: NA                   Social Determinants of Health (SDOH) Interventions    Readmission Risk Interventions    07/21/2022   10:11 AM  Readmission Risk Prevention Plan  Post Dischage Appt Complete  Medication Screening Complete  Transportation Screening Complete

## 2022-07-22 NOTE — Progress Notes (Signed)
PT Cancellation Note  Patient Details Name: Mathew Simpson MRN: [id number] DOB: 02-Jan-1936   Cancelled Treatment:    Reason Eval/Treat Not Completed: Pain limiting ability to participate (pt reports he has a bad headache and is not able to walk right now. Will follow.)  Philomena Doheny PT 07/22/2022  Acute Rehabilitation Services  Office [phone]

## 2022-07-22 NOTE — TOC Progression Note (Signed)
Transition of Care [facility]) - Progression Note    Patient Details  Name: Mathew Simpson MRN: [id number] Date of Birth: 1936-07-06  [facility]) CM/SW Contact  Merla Sawka, Juliann Pulse, RN Phone Number: 07/22/2022, 2:46 PM  Clinical Narrative: [employer] [phone] for initiation of auth to [place].      Expected Discharge Plan: Skilled Nursing Facility Barriers to Discharge: Insurance Authorization  Expected Discharge Plan and Services Expected Discharge Plan: [place] In-house Referral: Clinical Social Work Discharge Planning Services: CM Consult Post Acute Care Choice: [place] Living arrangements for the past 2 months: Single Family Home                 DME Arranged: N/A DME Agency: NA                   Social Determinants of Health (SDOH) Interventions    Readmission Risk Interventions    07/21/2022   10:11 AM  Readmission Risk Prevention Plan  Post Dischage Appt Complete  Medication Screening Complete  Transportation Screening Complete

## 2022-07-22 NOTE — Care Management Important Message (Signed)
Important Message  Patient Details IM Letter given to the Patient. Name: Mathew Simpson MRN: [id number] Date of Birth: July 19, 1936   Medicare Important Message Given:  Yes     Kerin Salen 07/22/2022, 10:14 AM

## 2022-07-23 ENCOUNTER — Other Ambulatory Visit (HOSPITAL_COMMUNITY): Payer: Self-pay

## 2022-07-23 DIAGNOSIS — A419 Sepsis, unspecified organism: Secondary | ICD-10-CM | POA: Diagnosis not present

## 2022-07-23 DIAGNOSIS — R519 Headache, unspecified: Secondary | ICD-10-CM | POA: Insufficient documentation

## 2022-07-23 DIAGNOSIS — L0390 Cellulitis, unspecified: Secondary | ICD-10-CM | POA: Diagnosis not present

## 2022-07-23 LAB — CBC WITH DIFFERENTIAL/PLATELET
Abs Immature Granulocytes: 0.07 10*3/uL (ref 0.00–0.07)
Basophils Absolute: 0 10*3/uL (ref 0.0–0.1)
Basophils Relative: 0 %
Eosinophils Absolute: 0.1 10*3/uL (ref 0.0–0.5)
Eosinophils Relative: 2 %
HCT: 39.2 % (ref 39.0–52.0)
Hemoglobin: 13.4 g/dL (ref 13.0–17.0)
Immature Granulocytes: 1 %
Lymphocytes Relative: 18 %
Lymphs Abs: 1.3 10*3/uL (ref 0.7–4.0)
MCH: 30.9 pg (ref 26.0–34.0)
MCHC: 34.2 g/dL (ref 30.0–36.0)
MCV: 90.3 fL (ref 80.0–100.0)
Monocytes Absolute: 1 10*3/uL (ref 0.1–1.0)
Monocytes Relative: 13 %
Neutro Abs: 4.8 10*3/uL (ref 1.7–7.7)
Neutrophils Relative %: 66 %
Platelets: 219 10*3/uL (ref 150–400)
RBC: 4.34 MIL/uL (ref 4.22–5.81)
RDW: 14 % (ref 11.5–15.5)
WBC: 7.4 10*3/uL (ref 4.0–10.5)
nRBC: 0 % (ref 0.0–0.2)

## 2022-07-23 LAB — RETICULOCYTES
Immature Retic Fract: 12.5 % (ref 2.3–15.9)
RBC.: 4.35 MIL/uL (ref 4.22–5.81)
Retic Count, Absolute: 49.2 10*3/uL (ref 19.0–186.0)
Retic Ct Pct: 1.1 % (ref 0.4–3.1)

## 2022-07-23 LAB — COMPREHENSIVE METABOLIC PANEL
ALT: 17 U/L (ref 0–44)
AST: 28 U/L (ref 15–41)
Albumin: 2.9 g/dL — ABNORMAL LOW (ref 3.5–5.0)
Alkaline Phosphatase: 73 U/L (ref 38–126)
Anion gap: 8 (ref 5–15)
BUN: 21 mg/dL (ref 8–23)
CO2: 25 mmol/L (ref 22–32)
Calcium: 8.2 mg/dL — ABNORMAL LOW (ref 8.9–10.3)
Chloride: 102 mmol/L (ref 98–111)
Creatinine, Ser: 1.02 mg/dL (ref 0.61–1.24)
GFR, Estimated: >60 mL/min (ref >60)
Glucose, Bld: 103 mg/dL — ABNORMAL HIGH (ref 70–99)
Potassium: 3.7 mmol/L (ref 3.5–5.1)
Sodium: 135 mmol/L (ref 135–145)
Total Bilirubin: 0.7 mg/dL (ref 0.3–1.2)
Total Protein: 6.7 g/dL (ref 6.5–8.1)

## 2022-07-23 LAB — IRON AND TIBC
Iron: 28 ug/dL — ABNORMAL LOW (ref 45–182)
Saturation Ratios: 11 % — ABNORMAL LOW (ref 17.9–39.5)
TIBC: 258 ug/dL (ref 250–450)
UIBC: 230 ug/dL

## 2022-07-23 LAB — VITAMIN B12: Vitamin B-12: 616 pg/mL (ref 180–914)

## 2022-07-23 LAB — FOLATE: Folate: 18.6 ng/mL (ref >5.9)

## 2022-07-23 LAB — FERRITIN: Ferritin: 302 ng/mL (ref 24–336)

## 2022-07-23 LAB — MAGNESIUM: Magnesium: 2.2 mg/dL (ref 1.7–2.4)

## 2022-07-23 LAB — PHOSPHORUS: Phosphorus: 3.2 mg/dL (ref 2.5–4.6)

## 2022-07-23 MED ORDER — CEPHALEXIN 500 MG PO CAPS: 500.0000 mg | ORAL_CAPSULE | Freq: Four times a day (QID) | ORAL | 0 refills | Status: AC

## 2022-07-23 MED ORDER — CIPROFLOXACIN HCL 0.3 % OP SOLN: 1.0000 [drp] | OPHTHALMIC | 0 refills | Status: DC

## 2022-07-23 MED ORDER — KETOROLAC TROMETHAMINE 0.5 % OP SOLN
1.0000 [drp] | Freq: Four times a day (QID) | OPHTHALMIC | 0 refills | Status: DC
  Filled 2022-07-23: qty 5, 25d supply, fill #0

## 2022-07-23 MED ORDER — HYDROCODONE-ACETAMINOPHEN 10-325 MG PO TABS: 1.0000 | ORAL_TABLET | Freq: Four times a day (QID) | ORAL | 0 refills | Status: DC

## 2022-07-23 NOTE — Discharge Summary (Signed)
Physician Discharge Summary  Mathew Simpson:[id number] DOB: 10/05/1936 DOA: 07/17/2022  PCP: Mathew Ginger, PA-C  Admit date: 07/17/2022 Discharge date: 07/23/2022  Admitted From: home Disposition:  SNF  Recommendations for Outpatient Follow-up:  Follow up with PCP in 1-2 weeks  Home Health: none Equipment/Devices: none  Discharge Condition: stable CODE STATUS: Full code Diet Orders (From admission, onward)     Start     Ordered   07/18/22 1135  Diet regular Room service appropriate? Yes; Fluid consistency: Thin  Diet effective now       Question Answer Comment  Room service appropriate? Yes   Fluid consistency: Thin      07/18/22 1134           HPI: Per admitting MD, Mathew Simpson is a 86 y.o. male with medical history significant of osteoarthritis, chronic atrial fibrillation, chronic back pain, glaucoma, hypertension, overweight who is brought to the emergency department due to decreased oral intake, fatigue, malaise associated with left lower extremity tenderness, edema and erythema.  He was febrile upon arrival.  He has been having a headache. He denied rhinorrhea, sore throat, wheezing or hemoptysis.  No chest pain, palpitations, diaphoresis, PND, orthopnea or pitting edema of the lower extremities.  No abdominal pain, nausea, emesis, diarrhea, constipation, melena or hematochezia.  No flank pain, dysuria, frequency or hematuria.  No polyuria, polydipsia, polyphagia or blurred vision.   Hospital Course / Discharge diagnoses: Principal Problem:   Sepsis due to cellulitis [facility]) Active Problems:   Essential hypertension   Cellulitis of left lower extremity   Cellulitis of left foot   Chronic atrial fibrillation (HCC)   Hypokalemia   Mild protein malnutrition (HCC)   Hyperbilirubinemia   Malnutrition of moderate degree   Cellulitis of Left Lower Extremity, improving  Cellulitis of Left Foot, improving slowly Sepsis due to cellulitis [facility]) -patient was  admitted to the hospital and placed on IV antibiotics.  Imaging did not show any evidence of osteomyelitis.  He was placed initially on vancomycin, and was de-escalated to cefazolin. Clinically his cellulitis has resolved, his white count has normalized and he will be converted to p.o. antibiotics for 3 additional days for a total of 8-day course.  Blood cultures have remained negative.  Hypokalemia-supplemented prior to discharge, potassium now normalized Essential Hypertension -Continue home medications  Chronic atrial fibrillation (HCC) -CHA2DS2-VASc Score of at least 5.  Continue Eliquis Moderate protein malnutrition ([place]) Nutrition Problem: Moderate Malnutrition Etiology: acute illness, poor appetite (LLE cellulitis) Signs/Symptoms: mild fat depletion, mild muscle depletion Interventions: MVI, Liberalize Diet Hyperbilirubinemia -of unclear significance, bilirubin has normalized  Thrombocytopenia-mild, resolved  Left eye conjunctivitis -Start ophthalmic antibiotic drops as well as analgesic drops; he is now on ciprofloxacin 0.3 ophthalmic drops 1 drop in left eye for every 4h while awake and ketorolac 0.5% ophthalmic solution 1 drop in left eye 4 times daily Headache, improving -Check Head CT w/o Contrast and showed "No evidence of acute intracranial abnormality. Generalized intracranial dolichoectasia."  Continue pain regimen   Discharge Instructions   Allergies as of 07/23/2022       Reactions   Penicillins Anaphylaxis   Did it involve swelling of the face/tongue/throat, SOB, or low BP? Yes Did it involve sudden or severe rash/hives, skin peeling, or any reaction on the inside of your mouth or nose? Unknown Did you need to seek medical attention at a hospital or doctor's office? Yes When did it last happen?      50+ years If  all above answers are "NO", may proceed with cephalosporin use.        Medication List     TAKE these medications    CALCIUM + D PO Take 1 tablet by  mouth 2 (two) times daily.   cephALEXin 500 MG capsule Commonly known as: KEFLEX Take 1 capsule (500 mg total) by mouth 4 (four) times daily for 3 days.   ciprofloxacin 0.3 % ophthalmic solution Commonly known as: CILOXAN Place 1 drop into the left eye every 4 (four) hours while awake. Administer 1 drop, every 2 hours, while awake, for 2 days. Then 1 drop, every 4 hours, while awake, for the next 5 days.   Dialyvite Vitamin D 5000 125 MCG (5000 UT) capsule Generic drug: Cholecalciferol Take 5,000 Units by mouth every evening.   Eliquis 5 MG Tabs tablet Generic drug: apixaban Take 5 mg by mouth 2 (two) times daily.   HYDROcodone-acetaminophen 10-325 MG tablet Commonly known as: NORCO Take 1 tablet by mouth 4 (four) times daily.   irbesartan 150 MG tablet Commonly known as: AVAPRO Take 150 mg by mouth daily.   ketorolac 0.5 % ophthalmic solution Commonly known as: ACULAR Place 1 drop into the left eye 4 (four) times daily.   multivitamin with minerals Tabs tablet Take 1 tablet by mouth daily at 2 PM.   PRESERVISION AREDS 2 PO Take 1 tablet by mouth in the morning and at bedtime.   Tiadylt ER 180 MG 24 hr capsule Generic drug: diltiazem Take 180 mg by mouth daily.        Consultations: none  Procedures/Studies:  CT HEAD WO CONTRAST (5MM)  Result Date: 07/21/2022 CLINICAL DATA:  Headache for 5 days. EXAM: CT HEAD WITHOUT CONTRAST TECHNIQUE: Contiguous axial images were obtained from the base of the skull through the vertex without intravenous contrast. RADIATION DOSE REDUCTION: This exam was performed according to the departmental dose-optimization program which includes automated exposure control, adjustment of the mA and/or kV according to patient size and/or use of iterative reconstruction technique. COMPARISON:  None Available. FINDINGS: Brain: There is no evidence of an acute infarct, intracranial hemorrhage, mass, midline shift, or extra-axial fluid collection.  Mild cerebral atrophy is within normal limits for age. Vascular: Generalized intracranial dolichoectasia, with the basilar artery measuring 1 cm in diameter. Atherosclerotic calcification. Skull: No fracture or suspicious osseous lesion. Sinuses/Orbits: Visualized paranasal sinuses and mastoid air cells are clear. Visualized orbits are unremarkable. Other: None. IMPRESSION: 1. No evidence of acute intracranial abnormality. 2. Generalized intracranial dolichoectasia. Electronically Signed   By: Logan Bores M.D.   On: 07/21/2022 15:21   DG Foot Complete Left  Result Date: 07/17/2022 CLINICAL DATA:  Altered mental status and left foot swelling and redness EXAM: LEFT FOOT - COMPLETE 3+ VIEW COMPARISON:  None Available. FINDINGS: No fracture or dislocation. No radiographic evidence of osteomyelitis. Scattered degenerative arthritis greatest at the first MTP joint where it is advanced. Os peroneum. IMPRESSION: No radiographic evidence of osteomyelitis. Electronically Signed   By: Placido Sou M.D.   On: 07/17/2022 12:19   DG Chest Port 1 View  Result Date: 07/17/2022 CLINICAL DATA:  Possible sepsis EXAM: PORTABLE CHEST 1 VIEW COMPARISON:  09/15/2019 FINDINGS: Transverse diameter heart is increased. There are no signs of pulmonary edema or focal pulmonary consolidation. There is no pleural effusion or pneumothorax. Degenerative changes are noted in right shoulder. IMPRESSION: Cardiomegaly. There are no signs of pulmonary edema or focal pulmonary consolidation. Electronically Signed   By: Elmer Picker  M.D.   On: 07/17/2022 12:18     Subjective: - no chest pain, shortness of breath, no abdominal pain, nausea or vomiting.   Discharge Exam: BP 129/82 (BP Location: Left Arm)   Pulse 88   Temp 98.9 F (37.2 C) (Oral)   Resp 20   Ht '5\' 10"'$  (1.778 m)   Wt 86.2 kg   SpO2 97%   BMI 27.26 kg/m   General: Pt is alert, awake, not in acute distress Cardiovascular: RRR, S1/S2 +, no rubs, no  gallops Respiratory: CTA bilaterally, no wheezing, no rhonchi Abdominal: Soft, NT, ND, bowel sounds + Extremities: no edema, no cyanosis    The results of significant diagnostics from this hospitalization (including imaging, microbiology, ancillary and laboratory) are listed below for reference.     Microbiology: Recent Results (from the past 240 hour(s))  Blood Culture (routine x 2)     Status: None   Collection Time: 07/17/22 11:15 AM   Specimen: BLOOD  Result Value Ref Range Status   Specimen Description   Final    BLOOD BLOOD LEFT FOREARM Performed at [facility] [address] [place], [place] [address]    Special Requests   Final    BOTTLES DRAWN AEROBIC AND ANAEROBIC Blood Culture results may not be optimal due to an inadequate volume of blood received in culture bottles Performed at [facility] [address], [place], [place] [address]    Culture   Final    NO GROWTH 5 DAYS Performed at [facility], [place] [address], [place], [place] [address]    Report Status 07/22/2022 FINAL  Final  Resp Panel by RT-PCR (Flu A&B, Covid) Anterior Nasal Swab     Status: None   Collection Time: 07/17/22 12:17 PM   Specimen: Anterior Nasal Swab  Result Value Ref Range Status   SARS Coronavirus 2 by RT PCR NEGATIVE NEGATIVE Final    Comment: (NOTE) SARS-CoV-2 target nucleic acids are NOT DETECTED.  The SARS-CoV-2 RNA is generally detectable in upper respiratory specimens during the acute phase of infection. The lowest concentration of SARS-CoV-2 viral copies this assay can detect is 138 copies/mL. A negative result does not preclude SARS-Cov-2 infection and should not be used as the sole basis for treatment or other patient management decisions. A negative result may occur with  improper specimen collection/handling, submission of specimen other than nasopharyngeal swab, presence of viral mutation(s) within the areas targeted by this  assay, and inadequate number of viral copies(<138 copies/mL). A negative result must be combined with clinical observations, patient history, and epidemiological information. The expected result is Negative.  Fact Sheet for Patients:  EntrepreneurPulse.com.au  Fact Sheet for Healthcare Providers:  IncredibleEmployment.be  This test is no t yet approved or cleared by the [place] FDA and  has been authorized for detection and/or diagnosis of SARS-CoV-2 by FDA under an Emergency Use Authorization (EUA). This EUA will remain  in effect (meaning this test can be used) for the duration of the COVID-19 declaration under Section 564(b)(1) of the Act, 21 U.S.C.section 360bbb-3(b)(1), unless the authorization is terminated  or revoked sooner.       Influenza A by PCR NEGATIVE NEGATIVE Final   Influenza B by PCR NEGATIVE NEGATIVE Final    Comment: (NOTE) The Xpert Xpress SARS-CoV-2/FLU/RSV plus assay is intended as an aid in the diagnosis of influenza from Nasopharyngeal swab specimens and should not be used as a sole basis for treatment. Nasal washings and  aspirates are unacceptable for Xpert Xpress SARS-CoV-2/FLU/RSV testing.  Fact Sheet for Patients: EntrepreneurPulse.com.au  Fact Sheet for Healthcare Providers: IncredibleEmployment.be  This test is not yet approved or cleared by the [place] FDA and has been authorized for detection and/or diagnosis of SARS-CoV-2 by FDA under an Emergency Use Authorization (EUA). This EUA will remain in effect (meaning this test can be used) for the duration of the COVID-19 declaration under Section 564(b)(1) of the Act, 21 U.S.C. section 360bbb-3(b)(1), unless the authorization is terminated or revoked.  Performed at [facility], [place] [address], [place], [place] [address]   Urine Culture     Status: None   Collection Time: 07/17/22  4:05 PM    Specimen: In/Out Cath Urine  Result Value Ref Range Status   Specimen Description   Final    IN/OUT CATH URINE Performed at [facility] [address] [place], [place] [address]    Special Requests   Final    NONE Performed at [facility], [place] [address], [place], [place] [address]    Culture   Final    NO GROWTH Performed at [facility], [place] [address], [place], [place] [address]    Report Status 07/19/2022 FINAL  Final  Blood Culture (routine x 2)     Status: None   Collection Time: 07/17/22  5:42 PM   Specimen: BLOOD  Result Value Ref Range Status   Specimen Description   Final    BLOOD RIGHT ANTECUBITAL Performed at [facility] [address], [place], [place] [address]    Special Requests   Final    BOTTLES DRAWN AEROBIC ONLY Blood Culture adequate volume Performed at [facility] [address] [place], [place] [address]    Culture   Final    NO GROWTH 5 DAYS Performed at [facility], [place] [address] [place], [place] [address]    Report Status 07/22/2022 FINAL  Final     Labs: Basic Metabolic Panel: Recent Labs  Lab 07/19/22 0555 07/20/22 0525 07/21/22 0509 07/22/22 0436 07/23/22 0436  NA 141 141 138 136 135  K 3.2* 3.3* 4.6 4.2 3.7  CL 107 108 105 100 102  CO2 '26 25 25 27 25  '$ GLUCOSE 138* 148* 122* 104* 103*  BUN '16 20 18 17 21  '$ CREATININE 0.83 0.87 0.79 0.93 1.02  CALCIUM 8.0* 8.2* 8.8* 8.5* 8.2*  MG 2.0 2.1 1.9 2.0 2.2  PHOS 2.5 2.9 3.2 3.5 3.2   Liver Function Tests: Recent Labs  Lab 07/19/22 0555 07/20/22 0525 07/21/22 0509 07/22/22 0436 07/23/22 0436  AST 23 22 34 28 28  ALT 27 30 33 26 17  ALKPHOS 66 71 81 77 73  BILITOT 1.0 0.8 1.2 0.8 0.7  PROT 6.0* 6.2* 6.7 6.9 6.7  ALBUMIN 2.9* 3.1* 3.1* 3.1* 2.9*   CBC: Recent Labs  Lab 07/19/22 0555 07/20/22 0525 07/21/22 0509 07/22/22 0436 07/23/22 0436  WBC 9.8 9.0 11.2* 10.5 7.4   NEUTROABS 6.6 5.4 7.5 8.0* 4.8  HGB 13.0 12.9* 14.1 13.9 13.4  HCT 37.9* 38.5* 41.2 41.1 39.2  MCV 89.8 90.6 89.0 90.5 90.3  PLT 172 196 141* 221 219   CBG: No results for input(s): "GLUCAP" in the last 168 hours. Hgb A1c No results for input(s): "HGBA1C" in the last 72 hours. Lipid Profile No results for input(s): "CHOL", "HDL", "LDLCALC", "TRIG", "CHOLHDL", "LDLDIRECT" in the last 72 hours. Thyroid function  studies No results for input(s): "TSH", "T4TOTAL", "T3FREE", "THYROIDAB" in the last 72 hours.  Invalid input(s): "FREET3" Urinalysis    Component Value Date/Time   COLORURINE YELLOW 07/17/2022 [place] 07/17/2022 1604   LABSPEC 1.018 07/17/2022 1604   PHURINE 6.0 07/17/2022 1604   GLUCOSEU 50 (A) 07/17/2022 1604   HGBUR MODERATE (A) 07/17/2022 1604   BILIRUBINUR NEGATIVE 07/17/2022 [place] 07/17/2022 1604   PROTEINUR 30 (A) 07/17/2022 1604   NITRITE NEGATIVE 07/17/2022 1604   LEUKOCYTESUR NEGATIVE 07/17/2022 1604    FURTHER DISCHARGE INSTRUCTIONS:   Get Medicines reviewed and adjusted: Please take all your medications with you for your next visit with your Primary MD   Laboratory/radiological data: Please request your Primary MD to go over all hospital tests and procedure/radiological results at the follow up, please ask your Primary MD to get all Hospital records sent to his/her office.   In some cases, they will be blood work, cultures and biopsy results pending at the time of your discharge. Please request that your primary care M.D. goes through all the records of your hospital data and follows up on these results.   Also Note the following: If you experience worsening of your admission symptoms, develop shortness of breath, life threatening emergency, suicidal or homicidal thoughts you must seek medical attention immediately by calling 911 or calling your MD immediately  if symptoms less severe.   You must read complete  instructions/literature along with all the possible adverse reactions/side effects for all the Medicines you take and that have been prescribed to you. Take any new Medicines after you have completely understood and accpet all the possible adverse reactions/side effects.    Do not drive when taking Pain medications or sleeping medications (Benzodaizepines)   Do not take more than prescribed Pain, Sleep and Anxiety Medications. It is not advisable to combine anxiety,sleep and pain medications without talking with your primary care practitioner   Special Instructions: If you have smoked or chewed Tobacco  in the last 2 yrs please stop smoking, stop any regular Alcohol  and or any Recreational drug use.   Wear Seat belts while driving.   Please note: You were cared for by a hospitalist during your hospital stay. Once you are discharged, your primary care physician will handle any further medical issues. Please note that NO REFILLS for any discharge medications will be authorized once you are discharged, as it is imperative that you return to your primary care physician (or establish a relationship with a primary care physician if you do not have one) for your post hospital discharge needs so that they can reassess your need for medications and monitor your lab values.  Time coordinating discharge: 40 minutes  SIGNED:  Marzetta Board, MD, PhD 07/23/2022, 9:10 AM

## 2022-07-23 NOTE — Plan of Care (Signed)

## 2022-07-23 NOTE — TOC Transition Note (Addendum)
Transition of Care [facility]) - CM/SW Discharge Note   Patient Details  Name: Mathew Simpson MRN: [id number] Date of Birth: 03/24/1936  [facility] CM/SW Contact:  Vassie Moselle, LCSW Phone Number: 07/23/2022, 11:41 AM   Clinical Narrative:    Pt is to discharge to [facility] for SN[facility]. Pt will be going to room 129B. RN to call report to [phone]. Pt's family unable to transport pt. [facility] has been called to transport pt to SNF.    Final next level of care: [place] Barriers to Discharge: Barriers Resolved   Patient Goals and CMS Choice Patient states their goals for this hospitalization and ongoing recovery are:: Go home   Choice offered to / list presented to : Patient  Discharge Placement   Existing PASRR number confirmed : 07/21/22          Patient chooses bed at: [facility] Patient to be transferred to facility by: Family Name of family member notified: Johnanna Schneiders Patient and family notified of of transfer: 07/23/22  Discharge Plan and Services In-house Referral: Clinical Social Work Discharge Planning Services: CM Consult Post Acute Care Choice: [place]          DME Arranged: N/A DME Agency: NA                  Social Determinants of Health (SDOH) Interventions     Readmission Risk Interventions    07/23/2022   11:40 AM 07/21/2022   10:11 AM  Readmission Risk Prevention Plan  Post Dischage Appt Complete Complete  Medication Screening Complete Complete  Transportation Screening Complete Complete

## 2022-07-24 ENCOUNTER — Other Ambulatory Visit (HOSPITAL_COMMUNITY): Payer: Self-pay

## 2022-07-29 DIAGNOSIS — R41841 Cognitive communication deficit: Secondary | ICD-10-CM | POA: Insufficient documentation

## 2022-08-04 ENCOUNTER — Other Ambulatory Visit (HOSPITAL_COMMUNITY): Payer: Self-pay

## 2022-08-19 ENCOUNTER — Encounter (INDEPENDENT_AMBULATORY_CARE_PROVIDER_SITE_OTHER): Payer: Medicare HMO | Admitting: Ophthalmology

## 2022-08-19 DIAGNOSIS — D3131 Benign neoplasm of right choroid: Secondary | ICD-10-CM

## 2022-08-19 DIAGNOSIS — H34832 Tributary (branch) retinal vein occlusion, left eye, with macular edema: Secondary | ICD-10-CM

## 2022-08-19 DIAGNOSIS — H353132 Nonexudative age-related macular degeneration, bilateral, intermediate dry stage: Secondary | ICD-10-CM

## 2022-08-19 DIAGNOSIS — I10 Essential (primary) hypertension: Secondary | ICD-10-CM

## 2022-08-19 DIAGNOSIS — H35033 Hypertensive retinopathy, bilateral: Secondary | ICD-10-CM | POA: Diagnosis not present

## 2022-08-19 DIAGNOSIS — H43813 Vitreous degeneration, bilateral: Secondary | ICD-10-CM

## 2022-09-17 ENCOUNTER — Encounter (INDEPENDENT_AMBULATORY_CARE_PROVIDER_SITE_OTHER): Payer: Medicare HMO | Admitting: Ophthalmology

## 2022-09-17 DIAGNOSIS — H34832 Tributary (branch) retinal vein occlusion, left eye, with macular edema: Secondary | ICD-10-CM

## 2022-09-17 DIAGNOSIS — I10 Essential (primary) hypertension: Secondary | ICD-10-CM | POA: Diagnosis not present

## 2022-09-17 DIAGNOSIS — H353132 Nonexudative age-related macular degeneration, bilateral, intermediate dry stage: Secondary | ICD-10-CM | POA: Diagnosis not present

## 2022-09-17 DIAGNOSIS — H35033 Hypertensive retinopathy, bilateral: Secondary | ICD-10-CM

## 2022-09-17 DIAGNOSIS — H43813 Vitreous degeneration, bilateral: Secondary | ICD-10-CM

## 2022-09-29 ENCOUNTER — Encounter (INDEPENDENT_AMBULATORY_CARE_PROVIDER_SITE_OTHER): Payer: Medicare HMO | Admitting: Ophthalmology

## 2022-09-29 DIAGNOSIS — D3131 Benign neoplasm of right choroid: Secondary | ICD-10-CM

## 2022-09-29 DIAGNOSIS — H43813 Vitreous degeneration, bilateral: Secondary | ICD-10-CM

## 2022-09-29 DIAGNOSIS — H35033 Hypertensive retinopathy, bilateral: Secondary | ICD-10-CM | POA: Diagnosis not present

## 2022-09-29 DIAGNOSIS — H353132 Nonexudative age-related macular degeneration, bilateral, intermediate dry stage: Secondary | ICD-10-CM

## 2022-09-29 DIAGNOSIS — I10 Essential (primary) hypertension: Secondary | ICD-10-CM | POA: Diagnosis not present

## 2022-09-29 DIAGNOSIS — H34832 Tributary (branch) retinal vein occlusion, left eye, with macular edema: Secondary | ICD-10-CM | POA: Diagnosis not present

## 2022-10-13 ENCOUNTER — Encounter (INDEPENDENT_AMBULATORY_CARE_PROVIDER_SITE_OTHER): Payer: Medicare HMO | Admitting: Ophthalmology

## 2022-10-13 DIAGNOSIS — I10 Essential (primary) hypertension: Secondary | ICD-10-CM

## 2022-10-13 DIAGNOSIS — H35033 Hypertensive retinopathy, bilateral: Secondary | ICD-10-CM

## 2022-10-13 DIAGNOSIS — H34832 Tributary (branch) retinal vein occlusion, left eye, with macular edema: Secondary | ICD-10-CM | POA: Diagnosis not present

## 2022-10-13 DIAGNOSIS — H20012 Primary iridocyclitis, left eye: Secondary | ICD-10-CM

## 2022-10-13 DIAGNOSIS — H353132 Nonexudative age-related macular degeneration, bilateral, intermediate dry stage: Secondary | ICD-10-CM

## 2022-10-13 DIAGNOSIS — H43813 Vitreous degeneration, bilateral: Secondary | ICD-10-CM

## 2022-11-03 ENCOUNTER — Encounter (INDEPENDENT_AMBULATORY_CARE_PROVIDER_SITE_OTHER): Payer: Medicare HMO | Admitting: Ophthalmology

## 2022-11-03 DIAGNOSIS — H43813 Vitreous degeneration, bilateral: Secondary | ICD-10-CM

## 2022-11-03 DIAGNOSIS — I10 Essential (primary) hypertension: Secondary | ICD-10-CM

## 2022-11-03 DIAGNOSIS — H353132 Nonexudative age-related macular degeneration, bilateral, intermediate dry stage: Secondary | ICD-10-CM | POA: Diagnosis not present

## 2022-11-03 DIAGNOSIS — H35033 Hypertensive retinopathy, bilateral: Secondary | ICD-10-CM | POA: Diagnosis not present

## 2022-11-03 DIAGNOSIS — H34832 Tributary (branch) retinal vein occlusion, left eye, with macular edema: Secondary | ICD-10-CM | POA: Diagnosis not present

## 2022-11-03 DIAGNOSIS — H20012 Primary iridocyclitis, left eye: Secondary | ICD-10-CM

## 2022-12-08 ENCOUNTER — Encounter (INDEPENDENT_AMBULATORY_CARE_PROVIDER_SITE_OTHER): Payer: Medicare HMO | Admitting: Ophthalmology

## 2022-12-08 DIAGNOSIS — H43813 Vitreous degeneration, bilateral: Secondary | ICD-10-CM

## 2022-12-08 DIAGNOSIS — H353132 Nonexudative age-related macular degeneration, bilateral, intermediate dry stage: Secondary | ICD-10-CM | POA: Diagnosis not present

## 2022-12-08 DIAGNOSIS — H34832 Tributary (branch) retinal vein occlusion, left eye, with macular edema: Secondary | ICD-10-CM | POA: Diagnosis not present

## 2022-12-08 DIAGNOSIS — I10 Essential (primary) hypertension: Secondary | ICD-10-CM

## 2022-12-08 DIAGNOSIS — H35033 Hypertensive retinopathy, bilateral: Secondary | ICD-10-CM

## 2023-01-12 ENCOUNTER — Encounter (INDEPENDENT_AMBULATORY_CARE_PROVIDER_SITE_OTHER): Payer: Medicare HMO | Admitting: Ophthalmology

## 2023-01-12 DIAGNOSIS — H34832 Tributary (branch) retinal vein occlusion, left eye, with macular edema: Secondary | ICD-10-CM

## 2023-01-12 DIAGNOSIS — H35033 Hypertensive retinopathy, bilateral: Secondary | ICD-10-CM | POA: Diagnosis not present

## 2023-01-12 DIAGNOSIS — I10 Essential (primary) hypertension: Secondary | ICD-10-CM | POA: Diagnosis not present

## 2023-01-12 DIAGNOSIS — H353132 Nonexudative age-related macular degeneration, bilateral, intermediate dry stage: Secondary | ICD-10-CM | POA: Diagnosis not present

## 2023-01-12 DIAGNOSIS — H35372 Puckering of macula, left eye: Secondary | ICD-10-CM

## 2023-01-12 DIAGNOSIS — H43813 Vitreous degeneration, bilateral: Secondary | ICD-10-CM

## 2023-02-16 ENCOUNTER — Encounter (INDEPENDENT_AMBULATORY_CARE_PROVIDER_SITE_OTHER): Payer: Medicare HMO | Admitting: Ophthalmology

## 2023-04-30 ENCOUNTER — Encounter (INDEPENDENT_AMBULATORY_CARE_PROVIDER_SITE_OTHER): Payer: Medicare HMO | Admitting: Ophthalmology

## 2023-05-22 ENCOUNTER — Encounter (INDEPENDENT_AMBULATORY_CARE_PROVIDER_SITE_OTHER): Payer: Medicare HMO | Admitting: Ophthalmology

## 2023-05-22 DIAGNOSIS — H35033 Hypertensive retinopathy, bilateral: Secondary | ICD-10-CM | POA: Diagnosis not present

## 2023-05-22 DIAGNOSIS — H34832 Tributary (branch) retinal vein occlusion, left eye, with macular edema: Secondary | ICD-10-CM | POA: Diagnosis not present

## 2023-05-22 DIAGNOSIS — H43813 Vitreous degeneration, bilateral: Secondary | ICD-10-CM

## 2023-05-22 DIAGNOSIS — H353132 Nonexudative age-related macular degeneration, bilateral, intermediate dry stage: Secondary | ICD-10-CM | POA: Diagnosis not present

## 2023-05-22 DIAGNOSIS — I10 Essential (primary) hypertension: Secondary | ICD-10-CM | POA: Diagnosis not present

## 2023-06-19 ENCOUNTER — Encounter (INDEPENDENT_AMBULATORY_CARE_PROVIDER_SITE_OTHER): Payer: Medicare HMO | Admitting: Ophthalmology

## 2023-06-19 DIAGNOSIS — H35033 Hypertensive retinopathy, bilateral: Secondary | ICD-10-CM | POA: Diagnosis not present

## 2023-06-19 DIAGNOSIS — H353132 Nonexudative age-related macular degeneration, bilateral, intermediate dry stage: Secondary | ICD-10-CM

## 2023-06-19 DIAGNOSIS — I10 Essential (primary) hypertension: Secondary | ICD-10-CM

## 2023-06-19 DIAGNOSIS — H348322 Tributary (branch) retinal vein occlusion, left eye, stable: Secondary | ICD-10-CM

## 2023-06-19 DIAGNOSIS — H43813 Vitreous degeneration, bilateral: Secondary | ICD-10-CM

## 2023-07-17 ENCOUNTER — Encounter (INDEPENDENT_AMBULATORY_CARE_PROVIDER_SITE_OTHER): Payer: Medicare HMO | Admitting: Ophthalmology

## 2023-07-17 DIAGNOSIS — H34832 Tributary (branch) retinal vein occlusion, left eye, with macular edema: Secondary | ICD-10-CM

## 2023-07-17 DIAGNOSIS — H35033 Hypertensive retinopathy, bilateral: Secondary | ICD-10-CM | POA: Diagnosis not present

## 2023-07-17 DIAGNOSIS — H43813 Vitreous degeneration, bilateral: Secondary | ICD-10-CM

## 2023-07-17 DIAGNOSIS — I10 Essential (primary) hypertension: Secondary | ICD-10-CM

## 2023-07-17 DIAGNOSIS — H353132 Nonexudative age-related macular degeneration, bilateral, intermediate dry stage: Secondary | ICD-10-CM | POA: Diagnosis not present

## 2023-08-18 ENCOUNTER — Encounter (INDEPENDENT_AMBULATORY_CARE_PROVIDER_SITE_OTHER): Payer: Medicare HMO | Admitting: Ophthalmology

## 2023-08-18 DIAGNOSIS — H35033 Hypertensive retinopathy, bilateral: Secondary | ICD-10-CM

## 2023-08-18 DIAGNOSIS — I10 Essential (primary) hypertension: Secondary | ICD-10-CM | POA: Diagnosis not present

## 2023-08-18 DIAGNOSIS — H34832 Tributary (branch) retinal vein occlusion, left eye, with macular edema: Secondary | ICD-10-CM | POA: Diagnosis not present

## 2023-08-18 DIAGNOSIS — H353132 Nonexudative age-related macular degeneration, bilateral, intermediate dry stage: Secondary | ICD-10-CM

## 2023-09-08 ENCOUNTER — Encounter (HOSPITAL_COMMUNITY): Payer: Self-pay | Admitting: Emergency Medicine

## 2023-09-08 ENCOUNTER — Inpatient Hospital Stay (HOSPITAL_COMMUNITY)
Admission: EM | Admit: 2023-09-08 | Discharge: 2023-10-01 | DRG: 982 | Disposition: A | Discharge status: Skilled Nursing Facility | Payer: Medicare HMO | Attending: Internal Medicine | Admitting: Internal Medicine

## 2023-09-08 ENCOUNTER — Emergency Department (HOSPITAL_COMMUNITY): Payer: Medicare HMO

## 2023-09-08 DIAGNOSIS — R7881 Bacteremia: Secondary | ICD-10-CM

## 2023-09-08 DIAGNOSIS — T8454XA Infection and inflammatory reaction due to internal left knee prosthesis, initial encounter: Secondary | ICD-10-CM | POA: Diagnosis not present

## 2023-09-08 DIAGNOSIS — S06309A Unspecified focal traumatic brain injury with loss of consciousness of unspecified duration, initial encounter: Secondary | ICD-10-CM | POA: Diagnosis not present

## 2023-09-08 DIAGNOSIS — I10 Essential (primary) hypertension: Secondary | ICD-10-CM | POA: Diagnosis present

## 2023-09-08 DIAGNOSIS — G8929 Other chronic pain: Secondary | ICD-10-CM | POA: Diagnosis present

## 2023-09-08 DIAGNOSIS — A4101 Sepsis due to Methicillin susceptible Staphylococcus aureus: Secondary | ICD-10-CM | POA: Diagnosis not present

## 2023-09-08 DIAGNOSIS — Z7901 Long term (current) use of anticoagulants: Secondary | ICD-10-CM

## 2023-09-08 DIAGNOSIS — R001 Bradycardia, unspecified: Secondary | ICD-10-CM

## 2023-09-08 DIAGNOSIS — S199XXA Unspecified injury of neck, initial encounter: Secondary | ICD-10-CM | POA: Diagnosis present

## 2023-09-08 DIAGNOSIS — Y828 Other medical devices associated with adverse incidents: Secondary | ICD-10-CM | POA: Diagnosis not present

## 2023-09-08 DIAGNOSIS — S065XAA Traumatic subdural hemorrhage with loss of consciousness status unknown, initial encounter: Secondary | ICD-10-CM | POA: Diagnosis not present

## 2023-09-08 DIAGNOSIS — Z79899 Other long term (current) drug therapy: Secondary | ICD-10-CM

## 2023-09-08 DIAGNOSIS — Z880 Allergy status to penicillin: Secondary | ICD-10-CM

## 2023-09-08 DIAGNOSIS — F17210 Nicotine dependence, cigarettes, uncomplicated: Secondary | ICD-10-CM | POA: Diagnosis present

## 2023-09-08 DIAGNOSIS — Z6827 Body mass index (BMI) 27.0-27.9, adult: Secondary | ICD-10-CM

## 2023-09-08 DIAGNOSIS — I482 Chronic atrial fibrillation, unspecified: Secondary | ICD-10-CM | POA: Diagnosis present

## 2023-09-08 DIAGNOSIS — Z96651 Presence of right artificial knee joint: Secondary | ICD-10-CM | POA: Diagnosis present

## 2023-09-08 DIAGNOSIS — Y95 Nosocomial condition: Secondary | ICD-10-CM | POA: Diagnosis not present

## 2023-09-08 DIAGNOSIS — E440 Moderate protein-calorie malnutrition: Secondary | ICD-10-CM | POA: Diagnosis present

## 2023-09-08 DIAGNOSIS — R402252 Coma scale, best verbal response, oriented, at arrival to emergency department: Secondary | ICD-10-CM | POA: Diagnosis present

## 2023-09-08 DIAGNOSIS — F05 Delirium due to known physiological condition: Secondary | ICD-10-CM | POA: Diagnosis not present

## 2023-09-08 DIAGNOSIS — M1990 Unspecified osteoarthritis, unspecified site: Secondary | ICD-10-CM | POA: Diagnosis present

## 2023-09-08 DIAGNOSIS — Y831 Surgical operation with implant of artificial internal device as the cause of abnormal reaction of the patient, or of later complication, without mention of misadventure at the time of the procedure: Secondary | ICD-10-CM | POA: Diagnosis not present

## 2023-09-08 DIAGNOSIS — Z23 Encounter for immunization: Secondary | ICD-10-CM

## 2023-09-08 DIAGNOSIS — T8450XA Infection and inflammatory reaction due to unspecified internal joint prosthesis, initial encounter: Secondary | ICD-10-CM | POA: Diagnosis not present

## 2023-09-08 DIAGNOSIS — Y92009 Unspecified place in unspecified non-institutional (private) residence as the place of occurrence of the external cause: Secondary | ICD-10-CM

## 2023-09-08 DIAGNOSIS — R627 Adult failure to thrive: Secondary | ICD-10-CM | POA: Diagnosis present

## 2023-09-08 DIAGNOSIS — E119 Type 2 diabetes mellitus without complications: Secondary | ICD-10-CM

## 2023-09-08 DIAGNOSIS — M549 Dorsalgia, unspecified: Secondary | ICD-10-CM | POA: Diagnosis present

## 2023-09-08 DIAGNOSIS — E1165 Type 2 diabetes mellitus with hyperglycemia: Secondary | ICD-10-CM | POA: Diagnosis present

## 2023-09-08 DIAGNOSIS — Z96653 Presence of artificial knee joint, bilateral: Secondary | ICD-10-CM | POA: Diagnosis present

## 2023-09-08 DIAGNOSIS — S065X9A Traumatic subdural hemorrhage with loss of consciousness of unspecified duration, initial encounter: Principal | ICD-10-CM | POA: Diagnosis present

## 2023-09-08 DIAGNOSIS — Z794 Long term (current) use of insulin: Secondary | ICD-10-CM

## 2023-09-08 DIAGNOSIS — H409 Unspecified glaucoma: Secondary | ICD-10-CM | POA: Diagnosis present

## 2023-09-08 DIAGNOSIS — R32 Unspecified urinary incontinence: Secondary | ICD-10-CM | POA: Diagnosis not present

## 2023-09-08 DIAGNOSIS — S0101XA Laceration without foreign body of scalp, initial encounter: Secondary | ICD-10-CM | POA: Diagnosis present

## 2023-09-08 DIAGNOSIS — E876 Hypokalemia: Secondary | ICD-10-CM | POA: Diagnosis present

## 2023-09-08 DIAGNOSIS — R402362 Coma scale, best motor response, obeys commands, at arrival to emergency department: Secondary | ICD-10-CM | POA: Diagnosis present

## 2023-09-08 DIAGNOSIS — Z8249 Family history of ischemic heart disease and other diseases of the circulatory system: Secondary | ICD-10-CM

## 2023-09-08 DIAGNOSIS — I77810 Thoracic aortic ectasia: Secondary | ICD-10-CM | POA: Diagnosis present

## 2023-09-08 DIAGNOSIS — M479 Spondylosis, unspecified: Secondary | ICD-10-CM | POA: Diagnosis present

## 2023-09-08 DIAGNOSIS — I808 Phlebitis and thrombophlebitis of other sites: Secondary | ICD-10-CM | POA: Diagnosis not present

## 2023-09-08 DIAGNOSIS — R402142 Coma scale, eyes open, spontaneous, at arrival to emergency department: Secondary | ICD-10-CM | POA: Diagnosis present

## 2023-09-08 DIAGNOSIS — W109XXA Fall (on) (from) unspecified stairs and steps, initial encounter: Secondary | ICD-10-CM | POA: Diagnosis present

## 2023-09-08 DIAGNOSIS — K5900 Constipation, unspecified: Secondary | ICD-10-CM | POA: Diagnosis not present

## 2023-09-08 DIAGNOSIS — M25462 Effusion, left knee: Secondary | ICD-10-CM

## 2023-09-08 DIAGNOSIS — W19XXXA Unspecified fall, initial encounter: Principal | ICD-10-CM

## 2023-09-08 DIAGNOSIS — T8450XD Infection and inflammatory reaction due to unspecified internal joint prosthesis, subsequent encounter: Secondary | ICD-10-CM

## 2023-09-08 DIAGNOSIS — H8110 Benign paroxysmal vertigo, unspecified ear: Secondary | ICD-10-CM | POA: Diagnosis present

## 2023-09-08 DIAGNOSIS — I4811 Longstanding persistent atrial fibrillation: Secondary | ICD-10-CM | POA: Diagnosis present

## 2023-09-08 DIAGNOSIS — T80211A Bloodstream infection due to central venous catheter, initial encounter: Secondary | ICD-10-CM | POA: Diagnosis not present

## 2023-09-08 LAB — CBC WITH DIFFERENTIAL/PLATELET
Abs Immature Granulocytes: 0.03 10*3/uL (ref 0.00–0.07)
Basophils Absolute: 0 10*3/uL (ref 0.0–0.1)
Basophils Relative: 0 %
Eosinophils Absolute: 0.1 10*3/uL (ref 0.0–0.5)
Eosinophils Relative: 1 %
HCT: 38.5 % — ABNORMAL LOW (ref 39.0–52.0)
Hemoglobin: 12.7 g/dL — ABNORMAL LOW (ref 13.0–17.0)
Immature Granulocytes: 1 %
Lymphocytes Relative: 18 %
Lymphs Abs: 1.1 10*3/uL (ref 0.7–4.0)
MCH: 30.2 pg (ref 26.0–34.0)
MCHC: 33 g/dL (ref 30.0–36.0)
MCV: 91.4 fL (ref 80.0–100.0)
Monocytes Absolute: 0.4 10*3/uL (ref 0.1–1.0)
Monocytes Relative: 7 %
Neutro Abs: 4.4 10*3/uL (ref 1.7–7.7)
Neutrophils Relative %: 73 %
Platelets: 151 10*3/uL (ref 150–400)
RBC: 4.21 MIL/uL — ABNORMAL LOW (ref 4.22–5.81)
RDW: 13.9 % (ref 11.5–15.5)
WBC: 6 10*3/uL (ref 4.0–10.5)
nRBC: 0 % (ref 0.0–0.2)

## 2023-09-08 LAB — COMPREHENSIVE METABOLIC PANEL
ALT: 19 U/L (ref 0–44)
AST: 24 U/L (ref 15–41)
Albumin: 3.7 g/dL (ref 3.5–5.0)
Alkaline Phosphatase: 89 U/L (ref 38–126)
Anion gap: 9 (ref 5–15)
BUN: 19 mg/dL (ref 8–23)
CO2: 27 mmol/L (ref 22–32)
Calcium: 8.5 mg/dL — ABNORMAL LOW (ref 8.9–10.3)
Chloride: 102 mmol/L (ref 98–111)
Creatinine, Ser: 1.3 mg/dL — ABNORMAL HIGH (ref 0.61–1.24)
GFR, Estimated: 54 mL/min — ABNORMAL LOW (ref >60)
Glucose, Bld: 127 mg/dL — ABNORMAL HIGH (ref 70–99)
Potassium: 4 mmol/L (ref 3.5–5.1)
Sodium: 138 mmol/L (ref 135–145)
Total Bilirubin: 1.3 mg/dL — ABNORMAL HIGH (ref 0.3–1.2)
Total Protein: 6.7 g/dL (ref 6.5–8.1)

## 2023-09-08 LAB — HEMOGLOBIN A1C
Hgb A1c MFr Bld: 6.7 % — ABNORMAL HIGH (ref 4.8–5.6)
Mean Plasma Glucose: 145.59 mg/dL

## 2023-09-08 LAB — BLOOD GAS, VENOUS
Acid-Base Excess: 2.1 mmol/L — ABNORMAL HIGH (ref 0.0–2.0)
Bicarbonate: 28.7 mmol/L — ABNORMAL HIGH (ref 20.0–28.0)
Drawn by: 66579
O2 Saturation: 48.1 %
Patient temperature: 37
pCO2, Ven: 52 mmHg (ref 44–60)
pH, Ven: 7.35 (ref 7.25–7.43)
pO2, Ven: 31 mmHg — CL (ref 32–45)

## 2023-09-08 LAB — GLUCOSE, CAPILLARY: Glucose-Capillary: 134 mg/dL — ABNORMAL HIGH (ref 70–99)

## 2023-09-08 LAB — ETHANOL: Alcohol, Ethyl (B): <10 mg/dL (ref <10)

## 2023-09-08 LAB — MAGNESIUM: Magnesium: 1.9 mg/dL (ref 1.7–2.4)

## 2023-09-08 LAB — TSH: TSH: 0.209 u[IU]/mL — ABNORMAL LOW (ref 0.350–4.500)

## 2023-09-08 LAB — PHOSPHORUS: Phosphorus: 3.5 mg/dL (ref 2.5–4.6)

## 2023-09-08 LAB — PROCALCITONIN: Procalcitonin: <0.1 ng/mL

## 2023-09-08 LAB — CK: Total CK: 75 U/L (ref 49–397)

## 2023-09-08 MED ORDER — ONDANSETRON HCL 4 MG/2ML IJ SOLN
4.0000 mg | Freq: Once | INTRAMUSCULAR | Status: AC
  Administered 2023-09-08: 4 mg via INTRAVENOUS
  Filled 2023-09-08: qty 2

## 2023-09-08 MED ORDER — HYDROCODONE-ACETAMINOPHEN 5-325 MG PO TABS
1.0000 | ORAL_TABLET | ORAL | Status: DC | PRN
  Administered 2023-09-09: 1 via ORAL
  Administered 2023-09-10 – 2023-09-15 (×17): 2 via ORAL
  Administered 2023-09-17: 1 via ORAL
  Administered 2023-09-18: 2 via ORAL
  Administered 2023-09-18 – 2023-09-19 (×2): 1 via ORAL
  Administered 2023-09-19: 2 via ORAL
  Administered 2023-09-21 – 2023-10-01 (×5): 1 via ORAL
  Filled 2023-09-08 (×3): qty 2
  Filled 2023-09-08 (×2): qty 1
  Filled 2023-09-08 (×2): qty 2
  Filled 2023-09-08: qty 1
  Filled 2023-09-08 (×6): qty 2
  Filled 2023-09-08 (×2): qty 1
  Filled 2023-09-08 (×4): qty 2
  Filled 2023-09-08: qty 1
  Filled 2023-09-08 (×2): qty 2
  Filled 2023-09-08: qty 1
  Filled 2023-09-08 (×6): qty 2

## 2023-09-08 MED ORDER — INSULIN ASPART 100 UNIT/ML IJ SOLN
0.0000 [IU] | INTRAMUSCULAR | Status: DC
  Administered 2023-09-08: 1 [IU] via SUBCUTANEOUS
  Administered 2023-09-09: 2 [IU] via SUBCUTANEOUS
  Administered 2023-09-09: 1 [IU] via SUBCUTANEOUS
  Administered 2023-09-09 – 2023-09-10 (×2): 2 [IU] via SUBCUTANEOUS
  Administered 2023-09-10: 1 [IU] via SUBCUTANEOUS
  Administered 2023-09-10: 2 [IU] via SUBCUTANEOUS

## 2023-09-08 MED ORDER — ONDANSETRON HCL 4 MG/2ML IJ SOLN
4.0000 mg | Freq: Four times a day (QID) | INTRAMUSCULAR | Status: DC | PRN
  Administered 2023-09-18 – 2023-09-19 (×2): 4 mg via INTRAVENOUS
  Filled 2023-09-08 (×2): qty 2

## 2023-09-08 MED ORDER — ACETAMINOPHEN 325 MG PO TABS
650.0000 mg | ORAL_TABLET | Freq: Four times a day (QID) | ORAL | Status: DC | PRN
  Administered 2023-09-09 – 2023-09-30 (×9): 650 mg via ORAL
  Filled 2023-09-08 (×9): qty 2

## 2023-09-08 MED ORDER — ACETAMINOPHEN 650 MG RE SUPP
650.0000 mg | Freq: Four times a day (QID) | RECTAL | Status: DC | PRN
  Administered 2023-09-20: 650 mg via RECTAL
  Filled 2023-09-08: qty 1

## 2023-09-08 MED ORDER — ONDANSETRON HCL 4 MG PO TABS: 4.0000 mg | ORAL_TABLET | Freq: Four times a day (QID) | ORAL | Status: DC | PRN

## 2023-09-08 MED ORDER — SODIUM CHLORIDE 0.9 % IV BOLUS
1000.0000 mL | Freq: Once | INTRAVENOUS | Status: AC
  Administered 2023-09-08: 1000 mL via INTRAVENOUS

## 2023-09-08 MED ORDER — MORPHINE SULFATE (PF) 4 MG/ML IV SOLN
4.0000 mg | Freq: Once | INTRAVENOUS | Status: AC
  Administered 2023-09-08: 4 mg via INTRAVENOUS
  Filled 2023-09-08: qty 1

## 2023-09-08 MED ORDER — DILTIAZEM HCL ER COATED BEADS 180 MG PO CP24
180.0000 mg | ORAL_CAPSULE | Freq: Every day | ORAL | Status: DC
  Administered 2023-09-08 – 2023-09-14 (×7): 180 mg via ORAL
  Filled 2023-09-08 (×7): qty 1

## 2023-09-08 MED ORDER — SODIUM CHLORIDE 0.9 % IV SOLN: INTRAVENOUS | Status: AC

## 2023-09-08 MED ORDER — TETANUS-DIPHTH-ACELL PERTUSSIS 5-2.5-18.5 LF-MCG/0.5 IM SUSY
0.5000 mL | PREFILLED_SYRINGE | Freq: Once | INTRAMUSCULAR | Status: AC
  Administered 2023-09-08: 0.5 mL via INTRAMUSCULAR
  Filled 2023-09-08: qty 0.5

## 2023-09-08 NOTE — Assessment & Plan Note (Signed)
Restart Cardizem if able to tolerate

## 2023-09-08 NOTE — Progress Notes (Signed)
   09/08/23 1500  Spiritual Encounters  Type of Visit Initial  Care provided to: Patient  Referral source Trauma page  Reason for visit Trauma  OnCall Visit No   Ch responded to trauma level II page. There was no family present at bedside. Ch provided hospitality. No follow-up needed at this time.   Chaplain Val Kelsie Zaborowski, M.Div.

## 2023-09-08 NOTE — Subjective & Objective (Signed)
Larey Seat off the stairs family thinks may have fallen backwards from top of stairs on Eliquis for A.fib Last time may have taken it 24 h ago His neighbor hear him fall he seemed LOC and hit the back of his head

## 2023-09-08 NOTE — Plan of Care (Signed)
  Problem: Education: Goal: Ability to describe self-care measures that may prevent or decrease complications (Diabetes Survival Skills Education) will improve Outcome: Progressing Goal: Individualized Educational Video(s) Outcome: Progressing   Problem: Coping: Goal: Ability to adjust to condition or change in health will improve Outcome: Progressing   

## 2023-09-08 NOTE — Assessment & Plan Note (Signed)
-  Order Sensitive  SSI     -  check TSH and HgA1C  - Hold by mouth medications*

## 2023-09-08 NOTE — Assessment & Plan Note (Signed)
Patient had a significant fall today with LOC. Has not been on Eliquis for the past 24 hours we will continue to hold Neurosurgery is aware plan to follow-up as an outpatient patient also may have injury to his C-spine continue hard collar. Repeat CT head at around 1 AM Neurochecks every 4 hours.  If change in mental status will repeat CT sooner Patient has been a bit lethargic but easily arousable and fully awake and oriented jokes with family.  Per family and patient he just feels very tired because he has not rested

## 2023-09-08 NOTE — H&P (Addendum)
Mathew Simpson UXL:[id number] DOB: 07-10-1936 DOA: 09/08/2023     PCP: [name], PA-C      Patient arrived to ER on 09/08/23 at 1437 Referred by Attending Therisa Doyne, MD   Patient coming from:    home Lives alone,       Chief Complaint:   Chief Complaint  Patient presents with   Level 2, Fall on Thinners    HPI: Mathew Simpson is a 87 y.o. male with medical history significant of a.fib on eliquis, HTN    Presented with   fall head injury Larey Seat off the stairs family thinks may have fallen backwards from top of stairs on Eliquis for A.fib Last time may have taken it 24 h ago His neighbor hear him fall he seemed LOC and hit the back of his head     Denies significant ETOH intake   Does not smoke   Lab Results  Component Value Date   SARSCOV2NAA NEGATIVE 07/17/2022   SARSCOV2NAA NEGATIVE 03/08/2020   SARSCOV2NAA NEGATIVE 09/20/2019   SARSCOV2NAA NEGATIVE 09/15/2019        Regarding pertinent Chronic problems:      HTN on  avapro  DM 2 -  Lab Results  Component Value Date   HGBA1C 6.1 (H) 09/09/2019   diet controlled  A. Fib -   atrial fibrillation CHA2DS2 vas score   3    current  on anticoagulation with Eliquis,           -  Rate control:  Currently controlled with Diltiazem,            While in ER: Clinical Course as of 09/08/23 1900  Tue Sep 08, 2023  1528 Called CT scan to get patient to scanner. Patient 1st on list.  [CA]  1658 Radiology: SDH on falx, hemorrhagic contusion L frontal lobe, C spine abnormal, needs MRI C spine [JD]  1703 Reassessed patient at bedside. Patient asleep in bed. Responds to questions. C-collar placed. [CA]    Clinical Course User Index [CA] Mannie Stabile, PA-C [JD] Laurence Spates, MD     Lab Orders         CBC with Differential         Comprehensive metabolic panel      CT HEAD/neck 3 mm subdural hematoma along the anterior falx.  No midline shift 2. 6 mm hemorrhagic contusion in the  posterior left frontal lobe. 3. Asymmetric widening of the space between the dens and the left lateral mass of C1 relative to the right lateral mass. While this may be positional, is suspicious for worrisome fixation. Recommend further evaluation with MRI of the cervical spine. Reviewed by neurosurgery who feels no MRI at this time is necessary  CXR -  NON acute  Pelvic films no fractures   Following Medications were ordered in ER: Medications  Tdap (BOOSTRIX) injection 0.5 mL (0.5 mLs Intramuscular Given 09/08/23 1557)  morphine (PF) 4 MG/ML injection 4 mg (4 mg Intravenous Given 09/08/23 1555)  ondansetron (ZOFRAN) injection 4 mg (4 mg Intravenous Given 09/08/23 1556)  sodium chloride 0.9 % bolus 1,000 mL (1,000 mLs Intravenous New Bag/Given 09/08/23 1857)    _______________________________________________________ ER Provider Called:     NS  Dr. Patric Dykes They Recommend admit to medicine   Will see as an outpatient   or as needed      ED Triage Vitals  Encounter Vitals Group     BP 09/08/23 1522 (!) 160/85  Systolic BP Percentile --      Diastolic BP Percentile --      Pulse Rate 09/08/23 1603 71     Resp 09/08/23 1601 (!) 25     Temp 09/08/23 1522 (!) 97.5 F (36.4 C)     Temp Source 09/08/23 1522 Oral     SpO2 09/08/23 1607 94 %     Weight 09/08/23 1601 195 lb (88.5 kg)     Height 09/08/23 1601 5\' 10"  (1.778 m)     Head Circumference --      Peak Flow --      Pain Score 09/08/23 1600 4     Pain Loc --      Pain Education --      Exclude from Growth Chart --   ZOXW(96)@     _________________________________________ Significant initial  Findings: Abnormal Labs Reviewed  CBC WITH DIFFERENTIAL/PLATELET - Abnormal; Notable for the following components:      Result Value   RBC 4.21 (*)    Hemoglobin 12.7 (*)    HCT 38.5 (*)    All other components within normal limits  COMPREHENSIVE METABOLIC PANEL - Abnormal; Notable for the following components:   Glucose, Bld  127 (*)    Creatinine, Ser 1.30 (*)    Calcium 8.5 (*)    Total Bilirubin 1.3 (*)    GFR, Estimated 54 (*)    All other components within normal limits        ECG: Ordered Personally reviewed and interpreted by me showing: HR : 73 Rhythm:  A.fib. no evidence of ischemic changes QTC 468  BNP (last 3 results) No results for input(s): "BNP" in the last 8760 hours.   COVID-19 Labs  No results for input(s): "DDIMER", "FERRITIN", "LDH", "CRP" in the last 72 hours.  Lab Results  Component Value Date   SARSCOV2NAA NEGATIVE 07/17/2022   SARSCOV2NAA NEGATIVE 03/08/2020   SARSCOV2NAA NEGATIVE 09/20/2019   SARSCOV2NAA NEGATIVE 09/15/2019    The recent clinical data is shown below. Vitals:   09/08/23 1522 09/08/23 1601 09/08/23 1603 09/08/23 1607  BP: (!) 160/85     Pulse:   71   Resp:  (!) 25    Temp: (!) 97.5 F (36.4 C)     TempSrc: Oral     SpO2:    94%  Weight:  88.5 kg    Height:  5\' 10"  (1.778 m)      WBC     Component Value Date/Time   WBC 6.0 09/08/2023 1538   LYMPHSABS 1.1 09/08/2023 1538   MONOABS 0.4 09/08/2023 1538   EOSABS 0.1 09/08/2023 1538   BASOSABS 0.0 09/08/2023 1538     UA  ordered     Results for orders placed or performed during the hospital encounter of 07/17/22  Blood Culture (routine x 2)     Status: None   Collection Time: 07/17/22 11:15 AM   Specimen: BLOOD  Result Value Ref Range Status   Specimen Description   Final    BLOOD BLOOD LEFT FOREARM Performed at [facility], [address] [place], [place] [address]    Special Requests   Final    BOTTLES DRAWN AEROBIC AND ANAEROBIC Blood Culture results may not be optimal due to an inadequate volume of blood received in culture bottles Performed at [facility], [address], [place], [place] [address]    Culture   Final    NO GROWTH 5 DAYS Performed at [facility], [address], [place], [place] [address]    Report Status 07/22/2022  FINAL  Final  Resp Panel by RT-PCR (Flu A&B, Covid) Anterior Nasal Swab     Status: None   Collection Time: 07/17/22 12:17 PM   Specimen: Anterior Nasal Swab  Result Value Ref Range Status   SARS Coronavirus 2 by RT PCR NEGATIVE NEGATIVE Final    Comment: (NOTE) SARS-CoV-2 target nucleic acids are NOT DETECTED.  The SARS-CoV-2 RNA is generally detectable in upper respiratory specimens during the acute phase of infection. The lowest concentration of SARS-CoV-2 viral copies this assay can detect is 138 copies/mL. A negative result does not preclude SARS-Cov-2 infection and should not be used as the sole basis for treatment or other patient management decisions. A negative result may occur with  improper specimen collection/handling, submission of specimen other than nasopharyngeal swab, presence of viral mutation(s) within the areas targeted by this assay, and inadequate number of viral copies(<138 copies/mL). A negative result must be combined with clinical observations, patient history, and epidemiological information. The expected result is Negative.  Fact Sheet for Patients:  BloggerCourse.com  Fact Sheet for Healthcare Providers:  SeriousBroker.it  This test is no t yet approved or cleared by the [place] FDA and  has been authorized for detection and/or diagnosis of SARS-CoV-2 by FDA under an Emergency Use Authorization (EUA). This EUA will remain  in effect (meaning this test can be used) for the duration of the COVID-19 declaration under Section 564(b)(1) of the Act, 21 U.S.C.section 360bbb-3(b)(1), unless the authorization is terminated  or revoked sooner.       Influenza A by PCR NEGATIVE NEGATIVE Final   Influenza B by PCR NEGATIVE NEGATIVE Final    Comment: (NOTE) The Xpert Xpress SARS-CoV-2/FLU/RSV plus assay is intended as an aid in the diagnosis of influenza from Nasopharyngeal swab specimens and should not  be used as a sole basis for treatment. Nasal washings and aspirates are unacceptable for Xpert Xpress SARS-CoV-2/FLU/RSV testing.  Fact Sheet for Patients: BloggerCourse.com  Fact Sheet for Healthcare Providers: SeriousBroker.it  This test is not yet approved or cleared by the [place] FDA and has been authorized for detection and/or diagnosis of SARS-CoV-2 by FDA under an Emergency Use Authorization (EUA). This EUA will remain in effect (meaning this test can be used) for the duration of the COVID-19 declaration under Section 564(b)(1) of the Act, 21 U.S.C. section 360bbb-3(b)(1), unless the authorization is terminated or revoked.  Performed at [facility], [address] [place], [place] [address]   Urine Culture     Status: None   Collection Time: 07/17/22  4:05 PM   Specimen: In/Out Cath Urine  Result Value Ref Range Status   Specimen Description   Final    IN/OUT CATH URINE Performed at [facility], [address] [place], [place] [address]    Special Requests   Final    NONE Performed at [facility], [address], [place], [place] [address]    Culture   Final    NO GROWTH Performed at [facility], [address], [place], [place] [address]    Report Status 07/19/2022 FINAL  Final  Blood Culture (routine x 2)     Status: None   Collection Time: 07/17/22  5:42 PM   Specimen: BLOOD  Result Value Ref Range Status   Specimen Description   Final    BLOOD RIGHT ANTECUBITAL Performed at [facility], [address], [place], [place] [address]    Special Requests   Final    BOTTLES DRAWN AEROBIC ONLY Blood Culture adequate volume Performed at [facility], [address], [place], [place] [address]    Culture   Final    NO GROWTH 5 DAYS Performed at [facility], [address] [place], [place]  [address]    Report Status 07/22/2022 FINAL  Final    ________________________________________________________________     __________________________________________________________ Recent Labs  Lab 09/08/23 1538  NA 138  K 4.0  CO2 27  GLUCOSE 127*  BUN 19  CREATININE 1.30*  CALCIUM 8.5*    Cr  stable,   Lab Results  Component Value Date   CREATININE 1.30 (H) 09/08/2023   CREATININE 1.02 07/23/2022   CREATININE 0.93 07/22/2022    Recent Labs  Lab 09/08/23 1538  AST 24  ALT 19  ALKPHOS 89  BILITOT 1.3*  PROT 6.7  ALBUMIN 3.7   Lab Results  Component Value Date   CALCIUM 8.5 (L) 09/08/2023   PHOS 3.2 07/23/2022    Plt: Lab Results  Component Value Date   PLT 151 09/08/2023    Recent Labs  Lab 09/08/23 1538  WBC 6.0  NEUTROABS 4.4  HGB 12.7*  HCT 38.5*  MCV 91.4  PLT 151    HG/HCT  stable,      Component Value Date/Time   HGB 12.7 (L) 09/08/2023 1538   HCT 38.5 (L) 09/08/2023 1538   MCV 91.4 09/08/2023 1538      _______________________________________________ Hospitalist was called for admission for   Fall, initial encounter    Subdural hematoma     The following Work up has been ordered so far:  Orders Placed This Encounter  Procedures   Critical Care   LACERATION REPAIR   DG Chest Portable 1 View   DG Pelvis 1-2 Views   CT HEAD WO CONTRAST ( )   CT Cervical Spine Wo Contrast   CT Head Wo Contrast   CBC with Differential   Comprehensive metabolic panel   Cardiac Monitoring - Continuous Indefinite   Consult to neurosurgery   Consult for [facility] Admission   ED EKG   EKG 12-Lead   Place in observation (patient's expected length of stay will be less than 2 midnights)     OTHER Significant initial  Findings:  labs showing:     DM  labs:  HbA1C: No results for input(s): "HGBA1C" in the last 8760 hours.     CBG (last 3)  No results for input(s): "GLUCAP" in the last 72 hours.        Cultures:    Component  Value Date/Time   SDES  07/17/2022 1742    BLOOD RIGHT ANTECUBITAL Performed at [facility], [address], [place], [place] [address]    SPECREQUEST  07/17/2022 1742    BOTTLES DRAWN AEROBIC ONLY Blood Culture adequate volume Performed at [facility], [address] [place], [place] [address]    CULT  07/17/2022 1742    NO GROWTH 5 DAYS Performed at [facility], [address] [place], [place] [address]    REPTSTATUS 07/22/2022 FINAL 07/17/2022 1742     Radiological Exams on Admission: CT HEAD WO CONTRAST ( )  Result Date: 09/08/2023 CLINICAL DATA:  Head trauma, moderate-severe; Neck trauma (Age >= 65y) EXAM: CT HEAD WITHOUT CONTRAST CT CERVICAL SPINE WITHOUT CONTRAST TECHNIQUE: Multidetector CT imaging  of the head and cervical spine was performed following the standard protocol without intravenous contrast. Multiplanar CT image reconstructions of the cervical spine were also generated. RADIATION DOSE REDUCTION: This exam was performed according to the departmental dose-optimization program which includes automated exposure control, adjustment of the mA and/or kV according to patient size and/or use of iterative reconstruction technique. COMPARISON:  CT head 07/21/22 FINDINGS: CT HEAD FINDINGS Brain: 3 mm subdural hematoma along the anterior falx. 6 mm hemorrhagic contusion in the posterior left frontal lobe. No CT evidence of an acute cortical infarct. No hydrocephalus. No mass effect. Vascular: Dolichoectasia of the intracranial vasculature. Skull: Soft tissue swelling midline posterior scalp. No evidence of underlying calvarial fracture. Sinuses/Orbits: No middle ear or mastoid effusion. Paranasal sinuses are clear. Bilateral lens replacement. Orbits are otherwise unremarkable. Other: None. CT CERVICAL SPINE FINDINGS Alignment: There asymmetric widening of the space between the dens in the left lateral mass of C1 relative to the right lateral  mass. Skull base and vertebrae: No acute fracture. No primary bone lesion or focal pathologic process. Soft tissues and spinal canal: No prevertebral fluid or swelling. No visible canal hematoma. Disc levels: No evidence of high-grade spinal canal stenosis. Upper chest: Negative. Other: None IMPRESSION: 1. 3 mm subdural hematoma along the anterior falx.  No midline shift 2. 6 mm hemorrhagic contusion in the posterior left frontal lobe. 3. Asymmetric widening of the space between the dens and the left lateral mass of C1 relative to the right lateral mass. While this may be positional, is suspicious for worrisome fixation. Recommend further evaluation with MRI of the cervical spine. 4. Soft tissue swelling midline posterior scalp. No evidence of underlying calvarial fracture. Electronically Signed   By: Lorenza Cambridge M.D.   On: 09/08/2023 16:55   CT Cervical Spine Wo Contrast  Result Date: 09/08/2023 CLINICAL DATA:  Head trauma, moderate-severe; Neck trauma (Age >= 65y) EXAM: CT HEAD WITHOUT CONTRAST CT CERVICAL SPINE WITHOUT CONTRAST TECHNIQUE: Multidetector CT imaging of the head and cervical spine was performed following the standard protocol without intravenous contrast. Multiplanar CT image reconstructions of the cervical spine were also generated. RADIATION DOSE REDUCTION: This exam was performed according to the departmental dose-optimization program which includes automated exposure control, adjustment of the mA and/or kV according to patient size and/or use of iterative reconstruction technique. COMPARISON:  CT head 07/21/22 FINDINGS: CT HEAD FINDINGS Brain: 3 mm subdural hematoma along the anterior falx. 6 mm hemorrhagic contusion in the posterior left frontal lobe. No CT evidence of an acute cortical infarct. No hydrocephalus. No mass effect. Vascular: Dolichoectasia of the intracranial vasculature. Skull: Soft tissue swelling midline posterior scalp. No evidence of underlying calvarial fracture.  Sinuses/Orbits: No middle ear or mastoid effusion. Paranasal sinuses are clear. Bilateral lens replacement. Orbits are otherwise unremarkable. Other: None. CT CERVICAL SPINE FINDINGS Alignment: There asymmetric widening of the space between the dens in the left lateral mass of C1 relative to the right lateral mass. Skull base and vertebrae: No acute fracture. No primary bone lesion or focal pathologic process. Soft tissues and spinal canal: No prevertebral fluid or swelling. No visible canal hematoma. Disc levels: No evidence of high-grade spinal canal stenosis. Upper chest: Negative. Other: None IMPRESSION: 1. 3 mm subdural hematoma along the anterior falx.  No midline shift 2. 6 mm hemorrhagic contusion in the posterior left frontal lobe. 3. Asymmetric widening of the space between the dens and the left lateral mass of C1 relative to the right lateral mass. While this  may be positional, is suspicious for worrisome fixation. Recommend further evaluation with MRI of the cervical spine. 4. Soft tissue swelling midline posterior scalp. No evidence of underlying calvarial fracture. Electronically Signed   By: Lorenza Cambridge M.D.   On: 09/08/2023 16:55   DG Pelvis 1-2 Views  Result Date: 09/08/2023 CLINICAL DATA:  Shortness of breath post fall EXAM: PELVIS - 1-2 VIEW COMPARISON:  CT 09/18/2019 FINDINGS: There is no evidence of pelvic fracture or diastasis. No pelvic bone lesions are seen. IMPRESSION: Negative. Electronically Signed   By: Corlis Leak M.D.   On: 09/08/2023 16:15   DG Chest Portable 1 View  Result Date: 09/08/2023 CLINICAL DATA:  Shortness of breath post fall EXAM: PORTABLE CHEST - 1 VIEW COMPARISON:  07/17/2022 FINDINGS: No pneumothorax. Relatively low lung volumes with prominent interstitial markings. No confluent airspace disease. Heart size and mediastinal contours are within normal limits. Aortic Atherosclerosis (ICD10-170.0). No effusion. Visualized bones unremarkable. IMPRESSION: Low lung  volumes with prominent interstitial markings. No acute findings. Electronically Signed   By: Corlis Leak M.D.   On: 09/08/2023 16:14   _______________________________________________________________________________________________________ Latest  Blood pressure (!) 160/85, pulse 71, temperature (!) 97.5 F (36.4 C), temperature source Oral, resp. rate (!) 25, height 5\' 10"  (1.778 m), weight 88.5 kg, SpO2 94%.   Vitals  labs and radiology finding personally reviewed  Review of Systems:    Pertinent positives include:  headache fall  Constitutional:  No weight loss, night sweats, Fevers, chills, fatigue, weight loss  HEENT:  No headaches, Difficulty swallowing,Tooth/dental problems,Sore throat,  No sneezing, itching, ear ache, nasal congestion, post nasal drip,  Cardio-vascular:  No chest pain, Orthopnea, PND, anasarca, dizziness, palpitations.no Bilateral lower extremity swelling  GI:  No heartburn, indigestion, abdominal pain, nausea, vomiting, diarrhea, change in bowel habits, loss of appetite, melena, blood in stool, hematemesis Resp:  no shortness of breath at rest. No dyspnea on exertion, No excess mucus, no productive cough, No non-productive cough, No coughing up of blood.No change in color of mucus.No wheezing. Skin:  no rash or lesions. No jaundice GU:  no dysuria, change in color of urine, no urgency or frequency. No straining to urinate.  No flank pain.  Musculoskeletal:  No joint pain or no joint swelling. No decreased range of motion. No back pain.  Psych:  No change in mood or affect. No depression or anxiety. No memory loss.  Neuro: no localizing neurological complaints, no tingling, no weakness, no double vision, no gait abnormality, no slurred speech, no confusion  All systems reviewed and apart from HOPI all are negative _______________________________________________________________________________________________ Past Medical History:   Past Medical History:   Diagnosis Date   Arthritis    back,   DJD (degenerative joint disease)    Dysrhythmia    A-fib   Glaucoma    Hypertension       Past Surgical History:  Procedure Laterality Date   EYE SURGERY     bil    JOINT REPLACEMENT     knee surgery     arthroscopic   TOTAL KNEE ARTHROPLASTY Left 09/12/2019   Procedure: TOTAL KNEE ARTHROPLASTY;  Surgeon: Ollen Gross, MD;  Location: [facility];  Service: Orthopedics;  Laterality: Left;    TOTAL KNEE ARTHROPLASTY Right 03/12/2020   Procedure: TOTAL KNEE ARTHROPLASTY;  Surgeon: Ollen Gross, MD;  Location: [facility];  Service: Orthopedics;  Laterality: Right;     Social History:  Ambulatory   independently      reports that  he has quit smoking. His smoking use included cigarettes. He has a 20 pack-year smoking history. He has never used smokeless tobacco. He reports that he does not currently use alcohol. He reports that he does not currently use drugs.     Family History:   Family History  Problem Relation Age of Onset   Hypertension Other    ______________________________________________________________________________________________ Allergies: Allergies  Allergen Reactions   Penicillins Anaphylaxis    Did it involve swelling of the face/tongue/throat, SOB, or low BP? Yes Did it involve sudden or severe rash/hives, skin peeling, or any reaction on the inside of your mouth or nose? Unknown Did you need to seek medical attention at a hospital or doctor's office? Yes When did it last happen?      50+ years If all above answers are "NO", may proceed with cephalosporin use.      Prior to Admission medications   Medication Sig Start Date End Date Taking? Authorizing Provider  apixaban (ELIQUIS) 5 MG TABS tablet Take 5 mg by mouth 2 (two) times daily.    [provider]  Calcium Citrate-Vitamin D (CALCIUM + D PO) Take 1 tablet by mouth 2 (two) times daily.    [provider]  Cholecalciferol  (DIALYVITE VITAMIN D 5000) 125 MCG (5000 UT) capsule Take 5,000 Units by mouth every evening.     [provider]  ciprofloxacin (CILOXAN) 0.3 % ophthalmic solution Place 1 drop into the left eye every 4 (four) hours while awake. Administer 1 drop, every 2 hours, while awake, for 2 days. Then 1 drop, every 4 hours, while awake, for the next 5 days. 07/23/22   Leatha Gilding, MD  HYDROcodone-acetaminophen (NORCO) 10-325 MG tablet Take 1 tablet by mouth 4 (four) times daily. 07/23/22   Leatha Gilding, MD  irbesartan (AVAPRO) 150 MG tablet Take 150 mg by mouth daily. 07/14/22   [provider]  ketorolac (ACULAR) 0.5 % ophthalmic solution Place 1 drop into the left eye 4 (four) times daily. 07/23/22   Leatha Gilding, MD  Multiple Vitamin (MULTIVITAMIN WITH MINERALS) TABS tablet Take 1 tablet by mouth daily at 2 PM.     [provider]  Multiple Vitamins-Minerals (PRESERVISION AREDS 2 PO) Take 1 tablet by mouth in the morning and at bedtime.    [provider]  TIADYLT ER 180 MG 24 hr capsule Take 180 mg by mouth daily. 05/05/22   [provider]    ___________________________________________________________________________________________________ Physical Exam:    09/08/2023    4:03 PM 09/08/2023    4:01 PM 09/08/2023    3:22 PM  Vitals with BMI  Height  5\' 10"    Weight  195 lbs   BMI  27.98   Systolic   160  Diastolic   85  Pulse 71      1. General:  in No  Acute distress   Chronically ill   -appearing 2. Psychological: Alert and   Oriented 3. Head/ENT:   Dry Mucous Membranes                          Head   traumatic, laceration has been repaired neck supple                          Poor Dentition 4. SKIN: decreased Skin turgor,  Skin clean Dry and intact no rash    5. Heart: Regular rate and rhythm  no  Murmur, no Rub or gallop 6. Lungs: , no wheezes or crackles   7. Abdomen: Soft,  non-tender, Non distended   bowel sounds present 8. Lower  extremities: no clubbing, cyanosis, no  edema 9. Neurologically  strength 5 out of 5 in all 4 extremities cranial nerves II through XII intact 10. MSK: Normal range of motion    Chart has been reviewed  ____________ _____________________  Assessment/Plan 87 y.o. male with medical history significant of a.fib on eliquis, HTN     Admitted for Fall, initial encounter    Subdural hematoma      Present on Admission:  Bleeding in head following injury with loss of consciousness (HCC)  Subdural hematoma (HCC)  Essential hypertension  Chronic atrial fibrillation (HCC)  Neck injury   Subdural hematoma (HCC) Patient had a significant fall today with LOC. Has not been on Eliquis for the past 24 hours we will continue to hold Neurosurgery is aware plan to follow-up as an outpatient patient also may have injury to his C-spine continue hard collar. Repeat CT head at around 1 AM Neurochecks every 4 hours.  If change in mental status will repeat CT sooner Patient has been a bit lethargic but easily arousable and fully awake and oriented jokes with family.  Per family and patient he just feels very tired because he has not rested  Essential hypertension Restart Cardizem if able to tolerate  Chronic atrial fibrillation (HCC) Hold Eliquis given fall and subdural hematoma restart Cardizem at 180 if able to tolerate  DM2 (diabetes mellitus, type 2) (HCC)  - Order Sensitive SSI    -  check TSH and HgA1C  - Hold by mouth medications    Neck injury Will need to have follow-up with neurosurgery as an outpatient remain in hard collar until then   Other plan as per orders.  DVT prophylaxis:  SCD     Code Status:    Code Status: Prior FULL CODE  as per patient   I had personally discussed CODE STATUS with patient   ACP   none    Family Communication:   Family  at  Bedside  plan of care was discussed with   grand-Daughter   Diet  diabetic   Disposition Plan:      To home once workup is  complete and patient is stable   Following barriers for discharge:                              Repeat CT head no progression of the subdural hematoma                        Would benefit from PT/OT eval prior to DC  Ordered                      Consults called: NS is aware   Admission status:  ED Disposition     ED Disposition  Admit   Condition  --   Comment  Hospital Area: [facility] [[id number]]  Level of Care: Progressive [102]  Admit to Progressive based on following criteria: NEUROLOGICAL AND NEUROSURGICAL complex patients with significant risk of instability, who do not meet ICU criteria, yet require close observation or frequent assessment (< / = every 2 - 4 hours) with medical / nursing intervention.  May place patient in observation at [facility] or [place]  [facility] if equivalent level of care is available:: No  Covid Evaluation: Asymptomatic - no recent exposure (last 10 days) testing not required  Diagnosis: Bleeding in head following injury with loss of consciousness [facility]) [[id number]]  Admitting Physician: Therisa Doyne [[id number]]  Attending Physician: Therisa Doyne [[id number]]          Obs     Level of care   progressive          Therisa Doyne 09/08/2023, 8:49 PM    [facility]     after 2 AM please page floor coverage PA If 7AM-7PM, please contact the day team taking care of the patient using [email or web address]

## 2023-09-08 NOTE — Assessment & Plan Note (Signed)
Hold Eliquis given fall and subdural hematoma restart Cardizem at 180 if able to tolerate

## 2023-09-08 NOTE — ED Notes (Signed)
.  sbar

## 2023-09-08 NOTE — ED Notes (Signed)
ED TO INPATIENT HANDOFF REPORT  ED Nurse Name and Phone #:  Mignon Pine RN, MSN [phone]  S Name/Age/Gender Idelle Jo 87 y.o. male Room/Bed: 043C/043C  Code Status   Code Status: Full Code  Home/SNF/Other Home Patient oriented to: self, place, time, and situation Is this baseline? Yes   Triage Complete: Triage complete  Chief Complaint Bleeding in head following injury with loss of consciousness (HCC) [S06.309A]  Triage Note Pt BIB [facility] from home . Patient had a ground level mechanical fall into the center of the stairs. He hit the posterior  part of. his head. Ems said he has a half inch laceration in the back of his head. EMS reported since the fall he has been having dizziness, nausea, vomiting and saying repetitive questions since the fall. Patient is on Eliquis he took the medication this morning. He was afib on the monitor and have history of afib. Vital signs: BP 182/110, HR 90 and blood sugar 149.   Allergies Allergies  Allergen Reactions   Penicillins Anaphylaxis    Did it involve swelling of the face/tongue/throat, SOB, or low BP? Yes Did it involve sudden or severe rash/hives, skin peeling, or any reaction on the inside of your mouth or nose? Unknown Did you need to seek medical attention at a hospital or doctor's office? Yes When did it last happen?      50+ years If all above answers are "NO", may proceed with cephalosporin use.     Level of Care/Admitting Diagnosis ED Disposition     ED Disposition  Admit   Condition  --   Comment  Hospital [facility] [[id number]]  Level of Care: Progressive [102]  Admit to Progressive based on following criteria: NEUROLOGICAL AND NEUROSURGICAL complex patients with significant risk of instability, who do not meet ICU criteria, yet require close observation or frequent assessment (< / = every 2 - 4 hours) with medical / nursing intervention.  May place patient in observation at [facility] or  [facility] if equivalent level of care is available:: No  Covid Evaluation: Asymptomatic - no recent exposure (last 10 days) testing not required  Diagnosis: Bleeding in head following injury with loss of consciousness [facility]) [[id number]]  Admitting Physician: Therisa Doyne [[id number]]  Attending Physician: Therisa Doyne [[id number]]          B Medical/Surgery History Past Medical History:  Diagnosis Date   Arthritis    back,   DJD (degenerative joint disease)    Dysrhythmia    A-fib   Glaucoma    Hypertension    Past Surgical History:  Procedure Laterality Date   EYE SURGERY     bil    JOINT REPLACEMENT     knee surgery     arthroscopic   TOTAL KNEE ARTHROPLASTY Left 09/12/2019   Procedure: TOTAL KNEE ARTHROPLASTY;  Surgeon: Ollen Gross, MD;  Location: [facility];  Service: Orthopedics;  Laterality: Left;    TOTAL KNEE ARTHROPLASTY Right 03/12/2020   Procedure: TOTAL KNEE ARTHROPLASTY;  Surgeon: Ollen Gross, MD;  Location: [facility];  Service: Orthopedics;  Laterality: Right;      A IV Location/Drains/Wounds Patient Lines/Drains/Airways Status     Active Line/Drains/Airways     Name Placement date Placement time Site Days   Peripheral IV 09/08/23 20 G Right Antecubital 09/08/23  1534  Antecubital  less than 1   External Urinary Catheter 07/18/22  1300  --  417   Incision (Closed) 03/12/20 Knee  Right 03/12/20  1424  -- 1275            Intake/Output Last 24 hours No intake or output data in the 24 hours ending 09/08/23 2005  Labs/Imaging Results for orders placed or performed during the hospital encounter of 09/08/23 (from the past 48 hour(s))  CBC with Differential     Status: Abnormal   Collection Time: 09/08/23  3:38 PM  Result Value Ref Range   WBC 6.0 4.0 - 10.5 K/uL   RBC 4.21 (L) 4.22 - 5.81 MIL/uL   Hemoglobin 12.7 (L) 13.0 - 17.0 g/dL   HCT 29.5 (L) 62.1 - 30.8 %   MCV 91.4 80.0 - 100.0 fL   MCH 30.2 26.0 - 34.0 pg   MCHC 33.0 30.0 -  36.0 g/dL   RDW 65.7 84.6 - 96.2 %   Platelets 151 150 - 400 K/uL   nRBC 0.0 0.0 - 0.2 %   Neutrophils Relative % 73 %   Neutro Abs 4.4 1.7 - 7.7 K/uL   Lymphocytes Relative 18 %   Lymphs Abs 1.1 0.7 - 4.0 K/uL   Monocytes Relative 7 %   Monocytes Absolute 0.4 0.1 - 1.0 K/uL   Eosinophils Relative 1 %   Eosinophils Absolute 0.1 0.0 - 0.5 K/uL   Basophils Relative 0 %   Basophils Absolute 0.0 0.0 - 0.1 K/uL   Immature Granulocytes 1 %   Abs Immature Granulocytes 0.03 0.00 - 0.07 K/uL    Comment: Performed at [facility], [address], [place], [place] [address]  Comprehensive metabolic panel     Status: Abnormal   Collection Time: 09/08/23  3:38 PM  Result Value Ref Range   Sodium 138 135 - 145 mmol/L   Potassium 4.0 3.5 - 5.1 mmol/L   Chloride 102 98 - 111 mmol/L   CO2 27 22 - 32 mmol/L   Glucose, Bld 127 (H) 70 - 99 mg/dL    Comment: Glucose reference range applies only to samples taken after fasting for at least 8 hours.   BUN 19 8 - 23 mg/dL   Creatinine, Ser 1.32 (H) 0.61 - 1.24 mg/dL   Calcium 8.5 (L) 8.9 - 10.3 mg/dL   Total Protein 6.7 6.5 - 8.1 g/dL   Albumin 3.7 3.5 - 5.0 g/dL   AST 24 15 - 41 U/L   ALT 19 0 - 44 U/L   Alkaline Phosphatase 89 38 - 126 U/L   Total Bilirubin 1.3 (H) 0.3 - 1.2 mg/dL   GFR, Estimated 54 (L) >60 mL/min    Comment: (NOTE) Calculated using the CKD-EPI Creatinine Equation (2021)    Anion gap 9 5 - 15    Comment: Performed at [facility], [address] [place], [place] [address]   CT HEAD WO CONTRAST ( )  Result Date: 09/08/2023 CLINICAL DATA:  Head trauma, moderate-severe; Neck trauma (Age >= 65y) EXAM: CT HEAD WITHOUT CONTRAST CT CERVICAL SPINE WITHOUT CONTRAST TECHNIQUE: Multidetector CT imaging of the head and cervical spine was performed following the standard protocol without intravenous contrast. Multiplanar CT image reconstructions of the cervical spine were also generated. RADIATION DOSE REDUCTION: This exam  was performed according to the departmental dose-optimization program which includes automated exposure control, adjustment of the mA and/or kV according to patient size and/or use of iterative reconstruction technique. COMPARISON:  CT head 07/21/22 FINDINGS: CT HEAD FINDINGS Brain: 3 mm subdural hematoma along the anterior falx. 6 mm hemorrhagic contusion in the posterior  left frontal lobe. No CT evidence of an acute cortical infarct. No hydrocephalus. No mass effect. Vascular: Dolichoectasia of the intracranial vasculature. Skull: Soft tissue swelling midline posterior scalp. No evidence of underlying calvarial fracture. Sinuses/Orbits: No middle ear or mastoid effusion. Paranasal sinuses are clear. Bilateral lens replacement. Orbits are otherwise unremarkable. Other: None. CT CERVICAL SPINE FINDINGS Alignment: There asymmetric widening of the space between the dens in the left lateral mass of C1 relative to the right lateral mass. Skull base and vertebrae: No acute fracture. No primary bone lesion or focal pathologic process. Soft tissues and spinal canal: No prevertebral fluid or swelling. No visible canal hematoma. Disc levels: No evidence of high-grade spinal canal stenosis. Upper chest: Negative. Other: None IMPRESSION: 1. 3 mm subdural hematoma along the anterior falx.  No midline shift 2. 6 mm hemorrhagic contusion in the posterior left frontal lobe. 3. Asymmetric widening of the space between the dens and the left lateral mass of C1 relative to the right lateral mass. While this may be positional, is suspicious for worrisome fixation. Recommend further evaluation with MRI of the cervical spine. 4. Soft tissue swelling midline posterior scalp. No evidence of underlying calvarial fracture. Electronically Signed   By: Lorenza Cambridge M.D.   On: 09/08/2023 16:55   CT Cervical Spine Wo Contrast  Result Date: 09/08/2023 CLINICAL DATA:  Head trauma, moderate-severe; Neck trauma (Age >= 65y) EXAM: CT HEAD WITHOUT  CONTRAST CT CERVICAL SPINE WITHOUT CONTRAST TECHNIQUE: Multidetector CT imaging of the head and cervical spine was performed following the standard protocol without intravenous contrast. Multiplanar CT image reconstructions of the cervical spine were also generated. RADIATION DOSE REDUCTION: This exam was performed according to the departmental dose-optimization program which includes automated exposure control, adjustment of the mA and/or kV according to patient size and/or use of iterative reconstruction technique. COMPARISON:  CT head 07/21/22 FINDINGS: CT HEAD FINDINGS Brain: 3 mm subdural hematoma along the anterior falx. 6 mm hemorrhagic contusion in the posterior left frontal lobe. No CT evidence of an acute cortical infarct. No hydrocephalus. No mass effect. Vascular: Dolichoectasia of the intracranial vasculature. Skull: Soft tissue swelling midline posterior scalp. No evidence of underlying calvarial fracture. Sinuses/Orbits: No middle ear or mastoid effusion. Paranasal sinuses are clear. Bilateral lens replacement. Orbits are otherwise unremarkable. Other: None. CT CERVICAL SPINE FINDINGS Alignment: There asymmetric widening of the space between the dens in the left lateral mass of C1 relative to the right lateral mass. Skull base and vertebrae: No acute fracture. No primary bone lesion or focal pathologic process. Soft tissues and spinal canal: No prevertebral fluid or swelling. No visible canal hematoma. Disc levels: No evidence of high-grade spinal canal stenosis. Upper chest: Negative. Other: None IMPRESSION: 1. 3 mm subdural hematoma along the anterior falx.  No midline shift 2. 6 mm hemorrhagic contusion in the posterior left frontal lobe. 3. Asymmetric widening of the space between the dens and the left lateral mass of C1 relative to the right lateral mass. While this may be positional, is suspicious for worrisome fixation. Recommend further evaluation with MRI of the cervical spine. 4. Soft tissue  swelling midline posterior scalp. No evidence of underlying calvarial fracture. Electronically Signed   By: Lorenza Cambridge M.D.   On: 09/08/2023 16:55   DG Pelvis 1-2 Views  Result Date: 09/08/2023 CLINICAL DATA:  Shortness of breath post fall EXAM: PELVIS - 1-2 VIEW COMPARISON:  CT 09/18/2019 FINDINGS: There is no evidence of pelvic fracture or diastasis. No pelvic bone lesions  are seen. IMPRESSION: Negative. Electronically Signed   By: Corlis Leak M.D.   On: 09/08/2023 16:15   DG Chest Portable 1 View  Result Date: 09/08/2023 CLINICAL DATA:  Shortness of breath post fall EXAM: PORTABLE CHEST - 1 VIEW COMPARISON:  07/17/2022 FINDINGS: No pneumothorax. Relatively low lung volumes with prominent interstitial markings. No confluent airspace disease. Heart size and mediastinal contours are within normal limits. Aortic Atherosclerosis (ICD10-170.0). No effusion. Visualized bones unremarkable. IMPRESSION: Low lung volumes with prominent interstitial markings. No acute findings. Electronically Signed   By: Corlis Leak M.D.   On: 09/08/2023 16:14    Pending Labs Unresulted Labs (From admission, onward)     Start     Ordered   09/09/23 0500  Prealbumin  Tomorrow morning,   R        09/08/23 1905   09/08/23 1906  Urinalysis, Complete w Microscopic -Urine, Clean Catch  Once,   R       Question Answer Comment  Release to patient Immediate   Specimen Source Urine, Clean Catch      09/08/23 1905   09/08/23 1906  Ethanol  Add-on,   AD        09/08/23 1905   09/08/23 1906  Procalcitonin  Add-on,   AD       References:    Procalcitonin Lower Respiratory Tract Infection AND Sepsis Procalcitonin Algorithm   09/08/23 1905   09/08/23 1906  Blood gas, venous  ONCE - STAT,   STAT       Question:  Release to patient  Answer:  Immediate   09/08/23 1905   09/08/23 1906  CK  Add-on,   AD        09/08/23 1905   09/08/23 1906  Magnesium  Add-on,   AD        09/08/23 1905   09/08/23 1906  Phosphorus  Add-on,   AD         09/08/23 1905   09/08/23 1906  TSH  Add-on,   AD        09/08/23 1905   09/08/23 1904  Hemoglobin A1c  Add-on,   AD       Comments: To assess prior glycemic control    09/08/23 1904   Signed and Held  Magnesium  Tomorrow morning,   R        Signed and Held   Signed and Held  Phosphorus  Tomorrow morning,   R        Signed and Held   Signed and Held  Comprehensive metabolic panel  Tomorrow morning,   R       Question:  Release to patient  Answer:  Immediate   Signed and Held   Signed and Held  CBC  Tomorrow morning,   R       Question:  Release to patient  Answer:  Immediate   Signed and Held            Vitals/Pain Today's Vitals   09/08/23 1601 09/08/23 1603 09/08/23 1607 09/08/23 1917  BP:      Pulse:  71    Resp: (!) 25     Temp:      TempSrc:      SpO2:   94%   Weight: 88.5 kg     Height: 5\' 10"  (1.778 m)     PainSc:    3     Isolation Precautions No active isolations  Medications Medications  insulin aspart (novoLOG)  injection 0-9 Units (has no administration in time range)  Tdap (BOOSTRIX) injection 0.5 mL (0.5 mLs Intramuscular Given 09/08/23 1557)  morphine (PF) 4 MG/ML injection 4 mg (4 mg Intravenous Given 09/08/23 1555)  ondansetron (ZOFRAN) injection 4 mg (4 mg Intravenous Given 09/08/23 1556)  sodium chloride 0.9 % bolus 1,000 mL (1,000 mLs Intravenous New Bag/Given 09/08/23 1857)    Mobility Per pt he ambulates without assistance. However, pt is a high fall risk so mobility is w/c w/ assist x1 until further evaluated by PT.      Focused Assessments    R Recommendations: See Admitting Provider Note  Report given to:   Additional Notes:  Still has in c-collar. Not cleared by MD.

## 2023-09-08 NOTE — Assessment & Plan Note (Signed)
Will need to have follow-up with neurosurgery as an outpatient remain in hard collar until then

## 2023-09-08 NOTE — ED Provider Notes (Signed)
[place] [facility]ERGENCY DEPARTMENT AT [facility] Provider Note   CSN: [id number] Arrival date & time: 09/08/23  1437     History  Chief Complaint  Patient presents with   Level 2, Fall on Thinners    Mathew Simpson is a 87 y.o. male with a past medical history significant for hypertension, chronic A-fib on Eliquis, chronic back pain, glaucoma who presents to the ED as a level 2 trauma.  Patient fell while walking up the stairs.  Hit the posterior aspect of his head.  No LOC.  Patient on Eliquis which he took this morning.  Per EMS patient complaining of dizziness, nausea, and had 1 episode of NBNB emesis.  EMS noted patient to have repetitive questions since the fall.  Patient denies any vomiting to me during initial evaluation.  Admits to nausea.  Admits to headache.  No visual or speech changes.  Denies unilateral weakness. Patient notes he tripped, causing him to fall. No chest pain or shortness of breath. No numbness or tingling.  History obtained from patient and past medical records. No interpreter used during encounter.       Home Medications Prior to Admission medications   Medication Sig Start Date End Date Taking? Authorizing Provider  apixaban (ELIQUIS) 5 MG TABS tablet Take 5 mg by mouth 2 (two) times daily.    [provider]  Calcium Citrate-Vitamin D (CALCIUM + D PO) Take 1 tablet by mouth 2 (two) times daily.    [provider]  Cholecalciferol (DIALYVITE VITAMIN D 5000) 125 MCG (5000 UT) capsule Take 5,000 Units by mouth every evening.     [provider]  ciprofloxacin (CILOXAN) 0.3 % ophthalmic solution Place 1 drop into the left eye every 4 (four) hours while awake. Administer 1 drop, every 2 hours, while awake, for 2 days. Then 1 drop, every 4 hours, while awake, for the next 5 days. 07/23/22   Leatha Gilding, MD  HYDROcodone-acetaminophen (NORCO) 10-325 MG tablet Take 1 tablet by mouth 4 (four) times daily. 07/23/22   Leatha Gilding, MD  irbesartan (AVAPRO) 150 MG tablet Take 150 mg by mouth daily. 07/14/22   [provider]  ketorolac (ACULAR) 0.5 % ophthalmic solution Place 1 drop into the left eye 4 (four) times daily. 07/23/22   Leatha Gilding, MD  Multiple Vitamin (MULTIVITAMIN WITH MINERALS) TABS tablet Take 1 tablet by mouth daily at 2 PM.     [provider]  Multiple Vitamins-Minerals (PRESERVISION AREDS 2 PO) Take 1 tablet by mouth in the morning and at bedtime.    [provider]  TIADYLT ER 180 MG 24 hr capsule Take 180 mg by mouth daily. 05/05/22   [provider]      Allergies    Penicillins    Review of Systems   Review of Systems  Respiratory:  Negative for shortness of breath.   Cardiovascular:  Negative for chest pain.  Neurological:  Positive for dizziness and headaches.    Physical Exam Updated Vital Signs BP (!) 160/85 (BP Location: Left Arm)   Pulse 71   Temp (!) 97.5 F (36.4 C) (Oral)   Resp (!) 25   Ht 5\' 10"  (1.778 m)   Wt 88.5 kg   SpO2 94%   BMI 27.98 kg/m  Physical Exam Vitals and nursing note reviewed.  Constitutional:      General: He is not in acute distress.    Appearance: He is not ill-appearing.  HENT:     Head: Normocephalic.     Comments: 4cm laceration to posterior scalp. See photo below Eyes:     Pupils: Pupils are equal, round, and reactive to light.  Cardiovascular:     Rate and Rhythm: Normal rate and regular rhythm.     Pulses: Normal pulses.     Heart sounds: Normal heart sounds. No murmur heard.    No friction rub. No gallop.  Pulmonary:     Effort: Pulmonary effort is normal.     Breath sounds: Normal breath sounds.  Abdominal:     General: Abdomen is flat. There is no distension.     Palpations: Abdomen is soft.     Tenderness: There is no abdominal tenderness. There is no guarding or rebound.  Musculoskeletal:        General: Normal range of motion.     Cervical back: Neck supple.  Skin:    General: Skin  is warm and dry.  Neurological:     General: No focal deficit present.     Mental Status: He is alert.     Comments: Speech is clear, able to follow commands CN III-XII intact Normal strength in upper and lower extremities bilaterally including dorsiflexion and plantar flexion, strong and equal grip strength Sensation grossly intact throughout Moves extremities without ataxia, coordination intact No pronator drift   Psychiatric:        Mood and Affect: Mood normal.        Behavior: Behavior normal.     ED Results / Procedures / Treatments   Labs (all labs ordered are listed, but only abnormal results are displayed) Labs Reviewed  CBC WITH DIFFERENTIAL/PLATELET - Abnormal; Notable for the following components:      Result Value   RBC 4.21 (*)    Hemoglobin 12.7 (*)    HCT 38.5 (*)    All other components within normal limits  COMPREHENSIVE METABOLIC PANEL - Abnormal; Notable for the following components:   Glucose, Bld 127 (*)    Creatinine, Ser 1.30 (*)    Calcium 8.5 (*)    Total Bilirubin 1.3 (*)    GFR, Estimated 54 (*)    All other components within normal limits    EKG None  Radiology CT HEAD WO CONTRAST ( )  Result Date: 09/08/2023 CLINICAL DATA:  Head trauma, moderate-severe; Neck trauma (Age >= 65y) EXAM: CT HEAD WITHOUT CONTRAST CT CERVICAL SPINE WITHOUT CONTRAST TECHNIQUE: Multidetector CT imaging of the head and cervical spine was performed following the standard protocol without intravenous contrast. Multiplanar CT image reconstructions of the cervical spine were also generated. RADIATION DOSE REDUCTION: This exam was performed according to the departmental dose-optimization program which includes automated exposure control, adjustment of the mA and/or kV according to patient size and/or use of iterative reconstruction technique. COMPARISON:  CT head 07/21/22 FINDINGS: CT HEAD FINDINGS Brain: 3 mm subdural hematoma along the anterior falx. 6 mm hemorrhagic  contusion in the posterior left frontal lobe. No CT evidence of an acute cortical infarct. No hydrocephalus. No mass effect. Vascular: Dolichoectasia of the intracranial vasculature. Skull: Soft tissue swelling midline posterior scalp. No evidence of underlying calvarial fracture. Sinuses/Orbits: No middle ear or mastoid effusion. Paranasal sinuses are clear. Bilateral lens replacement. Orbits are otherwise unremarkable. Other: None. CT CERVICAL SPINE FINDINGS Alignment: There asymmetric widening of the space between the dens in the left lateral mass of C1 relative to the right lateral mass. Skull base and vertebrae: No acute fracture. No primary bone  lesion or focal pathologic process. Soft tissues and spinal canal: No prevertebral fluid or swelling. No visible canal hematoma. Disc levels: No evidence of high-grade spinal canal stenosis. Upper chest: Negative. Other: None IMPRESSION: 1. 3 mm subdural hematoma along the anterior falx.  No midline shift 2. 6 mm hemorrhagic contusion in the posterior left frontal lobe. 3. Asymmetric widening of the space between the dens and the left lateral mass of C1 relative to the right lateral mass. While this may be positional, is suspicious for worrisome fixation. Recommend further evaluation with MRI of the cervical spine. 4. Soft tissue swelling midline posterior scalp. No evidence of underlying calvarial fracture. Electronically Signed   By: Lorenza Cambridge M.D.   On: 09/08/2023 16:55   CT Cervical Spine Wo Contrast  Result Date: 09/08/2023 CLINICAL DATA:  Head trauma, moderate-severe; Neck trauma (Age >= 65y) EXAM: CT HEAD WITHOUT CONTRAST CT CERVICAL SPINE WITHOUT CONTRAST TECHNIQUE: Multidetector CT imaging of the head and cervical spine was performed following the standard protocol without intravenous contrast. Multiplanar CT image reconstructions of the cervical spine were also generated. RADIATION DOSE REDUCTION: This exam was performed according to the departmental  dose-optimization program which includes automated exposure control, adjustment of the mA and/or kV according to patient size and/or use of iterative reconstruction technique. COMPARISON:  CT head 07/21/22 FINDINGS: CT HEAD FINDINGS Brain: 3 mm subdural hematoma along the anterior falx. 6 mm hemorrhagic contusion in the posterior left frontal lobe. No CT evidence of an acute cortical infarct. No hydrocephalus. No mass effect. Vascular: Dolichoectasia of the intracranial vasculature. Skull: Soft tissue swelling midline posterior scalp. No evidence of underlying calvarial fracture. Sinuses/Orbits: No middle ear or mastoid effusion. Paranasal sinuses are clear. Bilateral lens replacement. Orbits are otherwise unremarkable. Other: None. CT CERVICAL SPINE FINDINGS Alignment: There asymmetric widening of the space between the dens in the left lateral mass of C1 relative to the right lateral mass. Skull base and vertebrae: No acute fracture. No primary bone lesion or focal pathologic process. Soft tissues and spinal canal: No prevertebral fluid or swelling. No visible canal hematoma. Disc levels: No evidence of high-grade spinal canal stenosis. Upper chest: Negative. Other: None IMPRESSION: 1. 3 mm subdural hematoma along the anterior falx.  No midline shift 2. 6 mm hemorrhagic contusion in the posterior left frontal lobe. 3. Asymmetric widening of the space between the dens and the left lateral mass of C1 relative to the right lateral mass. While this may be positional, is suspicious for worrisome fixation. Recommend further evaluation with MRI of the cervical spine. 4. Soft tissue swelling midline posterior scalp. No evidence of underlying calvarial fracture. Electronically Signed   By: Lorenza Cambridge M.D.   On: 09/08/2023 16:55   DG Pelvis 1-2 Views  Result Date: 09/08/2023 CLINICAL DATA:  Shortness of breath post fall EXAM: PELVIS - 1-2 VIEW COMPARISON:  CT 09/18/2019 FINDINGS: There is no evidence of pelvic fracture  or diastasis. No pelvic bone lesions are seen. IMPRESSION: Negative. Electronically Signed   By: Corlis Leak M.D.   On: 09/08/2023 16:15   DG Chest Portable 1 View  Result Date: 09/08/2023 CLINICAL DATA:  Shortness of breath post fall EXAM: PORTABLE CHEST - 1 VIEW COMPARISON:  07/17/2022 FINDINGS: No pneumothorax. Relatively low lung volumes with prominent interstitial markings. No confluent airspace disease. Heart size and mediastinal contours are within normal limits. Aortic Atherosclerosis (ICD10-170.0). No effusion. Visualized bones unremarkable. IMPRESSION: Low lung volumes with prominent interstitial markings. No acute findings. Electronically Signed  By: Corlis Leak M.D.   On: 09/08/2023 16:14    Procedures .Critical Care  Performed by: Mannie Stabile, PA-C Authorized by: Mannie Stabile, PA-C   Critical care provider statement:    Critical care time (minutes):  38   Critical care was necessary to treat or prevent imminent or life-threatening deterioration of the following conditions:  Trauma   Critical care was time spent personally by me on the following activities:  Development of treatment plan with patient or surrogate, discussions with consultants, evaluation of patient's response to treatment, examination of patient, ordering and review of laboratory studies, ordering and review of radiographic studies, ordering and performing treatments and interventions, pulse oximetry, re-evaluation of patient's condition and review of old charts   I assumed direction of critical care for this patient from another provider in my specialty: no     Care discussed with: admitting provider   .Marland KitchenLaceration Repair  Date/Time: 09/08/2023 6:16 PM  Performed by: Mannie Stabile, PA-C Authorized by: Mannie Stabile, PA-C   Consent:    Consent obtained:  Verbal   Consent given by:  Patient   Risks, benefits, and alternatives were discussed: yes     Risks discussed:  Infection, poor  cosmetic result and poor wound healing   Alternatives discussed:  No treatment and delayed treatment Universal protocol:    Procedure explained and questions answered to patient or proxy's satisfaction: yes     Relevant documents present and verified: yes     Test results available: yes     Imaging studies available: yes     Required blood products, implants, devices, and special equipment available: yes     Site/side marked: yes     Immediately prior to procedure, a time out was called: yes     Patient identity confirmed:  Verbally with patient Anesthesia:    Anesthesia method:  None Laceration details:    Location:  Scalp   Scalp location:  Crown   Length (cm):  4   Depth (mm):  3 Pre-procedure details:    Preparation:  Patient was prepped and draped in usual sterile fashion and imaging obtained to evaluate for foreign bodies Exploration:    Limited defect created (wound extended): no     Hemostasis achieved with:  Direct pressure   Imaging obtained comment:  CT   Imaging outcome: foreign body not noted     Wound exploration: wound explored through full range of motion     Wound extent: no underlying fracture     Contaminated: no   Treatment:    Area cleansed with:  Saline   Amount of cleaning:  Standard   Irrigation solution:  Sterile saline   Irrigation volume:  100   Irrigation method:  Syringe   Visualized foreign bodies/material removed: no     Debridement:  None   Undermining:  None   Scar revision: no   Skin repair:    Repair method:  Staples   Number of staples:  5 Approximation:    Approximation:  Close Repair type:    Repair type:  Intermediate Post-procedure details:    Dressing:  Non-adherent dressing and bulky dressing   Procedure completion:  Tolerated well, no immediate complications     Medications Ordered in ED Medications  sodium chloride 0.9 % bolus 1,000 mL (has no administration in time range)  Tdap (BOOSTRIX) injection 0.5 mL (0.5 mLs  Intramuscular Given 09/08/23 1557)  morphine (PF) 4 MG/ML injection 4 mg (4  mg Intravenous Given 09/08/23 1555)  ondansetron (ZOFRAN) injection 4 mg (4 mg Intravenous Given 09/08/23 1556)    ED Course/ Medical Decision Making/ A&P Clinical Course as of 09/08/23 1838  Tue Sep 08, 2023  1528 Called CT scan to get patient to scanner. Patient 1st on list.  [CA]  1658 Radiology: SDH on falx, hemorrhagic contusion L frontal lobe, C spine abnormal, needs MRI C spine [JD]  1703 Reassessed patient at bedside. Patient asleep in bed. Responds to questions. C-collar placed. [CA]    Clinical Course User Index [CA] Mannie Stabile, PA-C [JD] Laurence Spates, MD                                 Medical Decision Making Amount and/or Complexity of Data Reviewed Independent Historian: EMS Labs: ordered. Decision-making details documented in ED Course. Radiology: ordered and independent interpretation performed. Decision-making details documented in ED Course.  Risk Prescription drug management. Decision regarding hospitalization.   This patient presents to the ED for concern of fall, this involves an extensive number of treatment options, and is a complaint that carries with it a high risk of complications and morbidity.  The differential diagnosis includes intracranial bleed, bony fracture, concussion, etc  87 year old male presents to the ED after a mechanical fall on stairs.  Patient tripped and fell hitting the posterior aspect of his head.  Patient is on Eliquis.  Per EMS had 1 episode of nonbloody, nonbilious emesis and repetitive questioning.  During initial evaluation patient admits to some nausea and a headache.  No chest pain or shortness of breath.  Patient activated as a level 2 trauma upon arrival to the ED. CT head/cervical spine ordered. Labs. CXR and pelvis x-ray. Morphine, zofran given. Tetanus updated.   CT head personally reviewed and interpreted which demonstrates a 3 mm subdural  hematoma, 6 mm hemorrhagic contusion, and possible C1 fixation.  C-collar placed.  Will discuss with neurosurgery for further recommendations.  Chest x-ray and pelvis x-ray personally reviewed and interpreted which are negative for any bony fractures or acute abnormalities.  CBC significant for mild anemia with hemoglobin 12.7.  No leukocytosis.  CMP significant for elevated creatinine 1.3. No major electrolyte derangements.   5:23 PM Discussed with Dr. Conchita Paris with neurosurgery who recommends repeat CT head in 8 hours. No Eliquis reversal needed. Recommends c-collar, to be kept on until evaluated by him in the office. No need for MRI c-spine.  6:15 PM scalp laceration thoroughly cleaned by RN. 5 staples placed as noted above. Whenever patient sits up in bed, he becomes extremely dizzy. IVFs given. Will discuss with hospitalist for admission for observation for repeat scan in 8 hours.   6:37 PM Discussed with Dr. Adela Glimpse with [facility] who agrees to admit patient. Repeat CT scan placed for 0100 tomorrow AM.   Lives at home No PCP Hx A. Fib  Discussed with Dr. Earlene Plater who evaluated patient at bedside and agrees with assessment and plan.       Final Clinical Impression(s) / ED Diagnoses Final diagnoses:  Fall, initial encounter  Subdural hematoma (HCC)  Laceration of scalp, initial encounter    Rx / DC Orders ED Discharge Orders     None         Jesusita Oka 09/08/23 1842    Laurence Spates, MD 09/09/23 [phone]

## 2023-09-08 NOTE — ED Provider Triage Note (Signed)
Emergency Medicine Provider Triage Evaluation Note  Mathew Simpson , a 87 y.o. male  was evaluated in triage.  Pt complains of mechanical fall.  Fall on thinners for A-fib.  Apparently had an episode of emesis,.  Of questioning with EMS.  He moves all 4 extremities.  His tetanus is not up-to-date, will update.  He denies any syncope, lightheadedness or dizziness prior to fall.  Will plan on labs, imaging and reassess  Review of Systems  Positive: Fall on thinners Negative:   Physical Exam  There were no vitals taken for this visit. Gen:   Awake, no distress   Head:  Stellate 3 cm laceration posterior scalp, no pulsatile bleeding Resp:  Normal effort  MSK:   Moves extremities without difficulty  Other:    Medical Decision Making  Medically screening exam initiated at 2:58 PM.  Appropriate orders placed.  JAUN HINDMARSH was informed that the remainder of the evaluation will be completed by another provider, this initial triage assessment does not replace that evaluation, and the importance of remaining in the ED until their evaluation is complete.  Fall on thinners  Labs and imaging ordered   Shereece Wellborn A, PA-C 09/08/23 1459

## 2023-09-08 NOTE — ED Triage Notes (Signed)
Pt BIB [facility] from home . Patient had a ground level mechanical fall into the center of the stairs. He hit the posterior  part of. his head. Ems said he has a half inch laceration in the back of his head. EMS reported since the fall he has been having dizziness, nausea, vomiting and saying repetitive questions since the fall. Patient is on Eliquis he took the medication this morning. He was afib on the monitor and have history of afib. Vital signs: BP 182/110, HR 90 and blood sugar 149.

## 2023-09-09 ENCOUNTER — Observation Stay (HOSPITAL_COMMUNITY): Payer: Medicare HMO

## 2023-09-09 DIAGNOSIS — S065XAA Traumatic subdural hemorrhage with loss of consciousness status unknown, initial encounter: Secondary | ICD-10-CM | POA: Diagnosis not present

## 2023-09-09 LAB — GLUCOSE, CAPILLARY
Glucose-Capillary: 104 mg/dL — ABNORMAL HIGH (ref 70–99)
Glucose-Capillary: 111 mg/dL — ABNORMAL HIGH (ref 70–99)
Glucose-Capillary: 118 mg/dL — ABNORMAL HIGH (ref 70–99)
Glucose-Capillary: 129 mg/dL — ABNORMAL HIGH (ref 70–99)
Glucose-Capillary: 165 mg/dL — ABNORMAL HIGH (ref 70–99)
Glucose-Capillary: 165 mg/dL — ABNORMAL HIGH (ref 70–99)
Glucose-Capillary: 96 mg/dL (ref 70–99)

## 2023-09-09 MED ORDER — LABETALOL HCL 5 MG/ML IV SOLN
5.0000 mg | INTRAVENOUS | Status: DC | PRN
  Administered 2023-09-09 – 2023-09-20 (×5): 5 mg via INTRAVENOUS
  Filled 2023-09-09 (×4): qty 4

## 2023-09-09 MED ORDER — IRBESARTAN 150 MG PO TABS: 150.0000 mg | ORAL_TABLET | Freq: Every day | ORAL | Status: DC

## 2023-09-09 MED ORDER — IRBESARTAN 150 MG PO TABS
150.0000 mg | ORAL_TABLET | Freq: Every day | ORAL | Status: DC
  Administered 2023-09-09: 150 mg via ORAL
  Filled 2023-09-09: qty 1

## 2023-09-09 NOTE — Plan of Care (Signed)

## 2023-09-09 NOTE — Progress Notes (Addendum)
PROGRESS NOTE    Mathew Simpson  BMW:[id number] DOB: 29-Jun-1936 DOA: 09/08/2023 PCP: [name], PA-C  Chief Complaint  Patient presents with   Level 2, Fall on Thinners    Brief Narrative:   Mathew Simpson is Mathew Simpson 87 y.o. male with medical history significant of Mathew Simpsonfib on eliquis, HTN who presented after Mathew Simpson mechanical fall.  Family things he may have fallen backwards from top of stairs.  Suspected LOC.  Assessment & Plan:   Principal Problem:   Bleeding in head following injury with loss of consciousness (HCC) Active Problems:   Essential hypertension   Chronic atrial fibrillation (HCC)   Subdural hematoma (HCC)   DM2 (diabetes mellitus, type 2) (HCC)   Neck injury  Subdural Hematoma Mechanical Fall Scalp Lac Head CT with 3 mm subdural along anterior falx, 6 mm hemorrhagic contusion in posterior L frontal lobe, asymmetric widening of the space between the dens and the left lateral mass of C1 relative to the right lateral mass Repeat CT head with unchanged 3 mm subdural hematoma along anterior falx and unchanged 6 mm hemorrhagic contusion in posterior L frontal lobe Appreciate neurology recommendations (hold eliquis for 2 weeks), no need for cervical immobilization, follow with nsgy outpatient PT/OT Staples for scalp lac will need removal outpatient   Atrial Fibrillation Cardizem Continue to hold eliquis at least 2 weeks, would discuss risks/benefits of resumption at his follow up appointment prior to resuming  HTN Cardizem Irbesartan in the AM  T2Dm A1c 6.7 SSI  Intracranial Dolichoectasia Needs outpatient follow up     DVT prophylaxis: SCD Code Status: full Family Communication: none Disposition:   Status is: Observation The patient remains OBS appropriate and will d/c before 2 midnights.   Consultants:  none  Procedures:  none  Antimicrobials:  Anti-infectives (From admission, onward)    None       Subjective: No new  complaints sleepy  Objective: Vitals:   09/09/23 0807 09/09/23 1118 09/09/23 1517 09/09/23 1952  BP: (!) 168/95 132/74  (!) 176/86  Pulse: 60  64 82  Resp: 16 16 17 18   Temp: 98.8 F (37.1 C) 98 F (36.7 C) 98.2 F (36.8 C) 98.6 F (37 C)  TempSrc:   Oral Oral  SpO2: 99%   92%  Weight:      Height:        Intake/Output Summary (Last 24 hours) at 09/09/2023 2042 Last data filed at 09/09/2023 2020 Gross per 24 hour  Intake 1305.75 ml  Output 200 ml  Net 1105.75 ml   Filed Weights   09/08/23 1601  Weight: 88.5 kg    Examination:  General exam: Appears calm and comfortable  Respiratory system: unlabored Cardiovascular system: RRR Gastrointestinal system: Abdomen is nondistended, soft and nontender.  Central nervous system: sleepy, PERRL, moving all extremities Extremities: no LEE   Data Reviewed: I have personally reviewed following labs and imaging studies  CBC: Recent Labs  Lab 09/08/23 1538 09/09/23 0850  WBC 6.0 6.1  NEUTROABS 4.4  --   HGB 12.7* 12.0*  HCT 38.5* 35.7*  MCV 91.4 92.0  PLT 151 137*    Basic Metabolic Panel: Recent Labs  Lab 09/08/23 1538 09/08/23 2109 09/09/23 0850  NA 138  --  141  K 4.0  --  3.6  CL 102  --  106  CO2 27  --  23  GLUCOSE 127*  --  96  BUN 19  --  15  CREATININE 1.30*  --  1.08  CALCIUM 8.5*  --  8.5*  MG  --  1.9 1.8  PHOS  --  3.5 3.5    GFR: Estimated Creatinine Clearance: 56 mL/min (by C-G formula based on SCr of 1.08 mg/dL).  Liver Function Tests: Recent Labs  Lab 09/08/23 1538 09/09/23 0850  AST 24 18  ALT 19 17  ALKPHOS 89 76  BILITOT 1.3* 1.4*  PROT 6.7 5.9*  ALBUMIN 3.7 3.3*    CBG: Recent Labs  Lab 09/09/23 0014 09/09/23 0359 09/09/23 0804 09/09/23 1147 09/09/23 1558  GLUCAP 104* 111* 96 165* 129*     No results found for this or any previous visit (from the past 240 hour(s)).       Radiology Studies: CT Head Wo Contrast  Result Date: 09/09/2023 CLINICAL DATA:  Head  trauma EXAM: CT HEAD WITHOUT CONTRAST TECHNIQUE: Contiguous axial images were obtained from the base of the skull through the vertex without intravenous contrast. RADIATION DOSE REDUCTION: This exam was performed according to the departmental dose-optimization program which includes automated exposure control, adjustment of the mA and/or kV according to patient size and/or use of iterative reconstruction technique. COMPARISON:  09/08/2023 FINDINGS: Brain: Unchanged 3 mm subdural hematoma along the anterior falx. Unchanged 6 mm hemorrhagic contusion in the posterior left frontal lobe (series 2, image 30). No evidence of acute infarct, new hemorrhage, mass, mass effect, or midline shift. No hydrocephalus or extra-axial collection Vascular: No hyperdense vessel. Atherosclerotic calcifications in the intracranial carotid and vertebral arteries. 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 Skull: Negative for fracture or focal lesion. Parietal scalp hematoma with superficial skin staples. Sinuses/Orbits: Minimal mucosal thickening in the ethmoid air cells. Status post bilateral lens replacements. Other: The mastoid air cells are well aerated. IMPRESSION: 1. Unchanged 3 mm subdural hematoma along the anterior falx. 2. Unchanged 6 mm hemorrhagic contusion in the posterior left frontal lobe. 3. 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 Electronically Signed   By: Wiliam Ke M.D.   On: 09/09/2023 02:08   CT HEAD WO CONTRAST ( )  Result Date: 09/08/2023 CLINICAL DATA:  Head trauma, moderate-severe; Neck trauma (Age >= 65y) EXAM: CT HEAD WITHOUT CONTRAST CT CERVICAL SPINE WITHOUT CONTRAST TECHNIQUE: Multidetector CT imaging of the head and cervical spine was performed following the standard protocol without intravenous contrast. Multiplanar CT image reconstructions  of the cervical spine were also generated. RADIATION DOSE REDUCTION: This exam was performed according to the departmental dose-optimization program which includes automated exposure control, adjustment of the mA and/or kV according to patient size and/or use of iterative reconstruction technique. COMPARISON:  CT head 07/21/22 FINDINGS: CT HEAD FINDINGS Brain: 3 mm subdural hematoma along the anterior falx. 6 mm hemorrhagic contusion in the posterior left frontal lobe. No CT evidence of an acute cortical infarct. No hydrocephalus. No mass effect. Vascular: Dolichoectasia of the intracranial vasculature. Skull: Soft tissue swelling midline posterior scalp. No evidence of underlying calvarial fracture. Sinuses/Orbits: No middle ear or mastoid effusion. Paranasal sinuses are clear. Bilateral lens replacement. Orbits are otherwise unremarkable. Other: None. CT CERVICAL SPINE FINDINGS Alignment: There asymmetric widening of the space between the dens in the left lateral mass of C1 relative to the right lateral mass. Skull base and vertebrae: No acute fracture. No primary bone lesion or focal pathologic process. Soft tissues and spinal canal: No prevertebral fluid or swelling. No visible canal  hematoma. Disc levels: No evidence of high-grade spinal canal stenosis. Upper chest: Negative. Other: None IMPRESSION: 1. 3 mm subdural hematoma along the anterior falx.  No midline shift 2. 6 mm hemorrhagic contusion in the posterior left frontal lobe. 3. Asymmetric widening of the space between the dens and the left lateral mass of C1 relative to the right lateral mass. While this may be positional, is suspicious for worrisome fixation. Recommend further evaluation with MRI of the cervical spine. 4. Soft tissue swelling midline posterior scalp. No evidence of underlying calvarial fracture. Electronically Signed   By: Lorenza Cambridge M.D.   On: 09/08/2023 16:55   CT Cervical Spine Wo Contrast  Result Date: 09/08/2023 CLINICAL  DATA:  Head trauma, moderate-severe; Neck trauma (Age >= 65y) EXAM: CT HEAD WITHOUT CONTRAST CT CERVICAL SPINE WITHOUT CONTRAST TECHNIQUE: Multidetector CT imaging of the head and cervical spine was performed following the standard protocol without intravenous contrast. Multiplanar CT image reconstructions of the cervical spine were also generated. RADIATION DOSE REDUCTION: This exam was performed according to the departmental dose-optimization program which includes automated exposure control, adjustment of the mA and/or kV according to patient size and/or use of iterative reconstruction technique. COMPARISON:  CT head 07/21/22 FINDINGS: CT HEAD FINDINGS Brain: 3 mm subdural hematoma along the anterior falx. 6 mm hemorrhagic contusion in the posterior left frontal lobe. No CT evidence of an acute cortical infarct. No hydrocephalus. No mass effect. Vascular: Dolichoectasia of the intracranial vasculature. Skull: Soft tissue swelling midline posterior scalp. No evidence of underlying calvarial fracture. Sinuses/Orbits: No middle ear or mastoid effusion. Paranasal sinuses are clear. Bilateral lens replacement. Orbits are otherwise unremarkable. Other: None. CT CERVICAL SPINE FINDINGS Alignment: There asymmetric widening of the space between the dens in the left lateral mass of C1 relative to the right lateral mass. Skull base and vertebrae: No acute fracture. No primary bone lesion or focal pathologic process. Soft tissues and spinal canal: No prevertebral fluid or swelling. No visible canal hematoma. Disc levels: No evidence of high-grade spinal canal stenosis. Upper chest: Negative. Other: None IMPRESSION: 1. 3 mm subdural hematoma along the anterior falx.  No midline shift 2. 6 mm hemorrhagic contusion in the posterior left frontal lobe. 3. Asymmetric widening of the space between the dens and the left lateral mass of C1 relative to the right lateral mass. While this may be positional, is suspicious for worrisome  fixation. Recommend further evaluation with MRI of the cervical spine. 4. Soft tissue swelling midline posterior scalp. No evidence of underlying calvarial fracture. Electronically Signed   By: Lorenza Cambridge M.D.   On: 09/08/2023 16:55   DG Pelvis 1-2 Views  Result Date: 09/08/2023 CLINICAL DATA:  Shortness of breath post fall EXAM: PELVIS - 1-2 VIEW COMPARISON:  CT 09/18/2019 FINDINGS: There is no evidence of pelvic fracture or diastasis. No pelvic bone lesions are seen. IMPRESSION: Negative. Electronically Signed   By: Corlis Leak M.D.   On: 09/08/2023 16:15   DG Chest Portable 1 View  Result Date: 09/08/2023 CLINICAL DATA:  Shortness of breath post fall EXAM: PORTABLE CHEST - 1 VIEW COMPARISON:  07/17/2022 FINDINGS: No pneumothorax. Relatively low lung volumes with prominent interstitial markings. No confluent airspace disease. Heart size and mediastinal contours are within normal limits. Aortic Atherosclerosis (ICD10-170.0). No effusion. Visualized bones unremarkable. IMPRESSION: Low lung volumes with prominent interstitial markings. No acute findings. Electronically Signed   By: Corlis Leak M.D.   On: 09/08/2023 16:14  Scheduled Meds:  diltiazem  180 mg Oral Daily   insulin aspart  0-9 Units Subcutaneous Q4H   Continuous Infusions:   LOS: 0 days    Time spent: over 30 min    Lacretia Nicks, MD [facility]   To contact the attending provider between 7A-7P or the covering provider during after hours 7P-7A, please log into the web site [email or web address] and access using universal [place] password for that web site. If you do not have the password, please call the hospital operator.  09/09/2023, 8:42 PM

## 2023-09-09 NOTE — Evaluation (Signed)
Physical Therapy Evaluation Patient Details Name: Mathew Simpson MRN: [id number] DOB: [birth year] Today's Date: 09/09/2023  History of Present Illness  Mathew Simpson is a 87 y.o. male admitted 9/17 to the hospital after presenting to the emergency department after a fall.  CT scan was therefore obtained which demonstrated a small intracranial hemorrhage in the left frontal/parietal region.  PMH for hypertension, glaucoma, hx of B TKR.  Clinical Impression  Pt admitted with above diagnosis. Ind prior to admission, declines previous falls and recalls circumstances of this event (slipped on wet stairs while carrying groceries.) Pt initially lethargic and a bit confused but became more alert and appropriate as we engaged with activity during PT and OT evaluations. Requires min assist for bed mobility, transfers and gait without an assistive device, CGA with AD however decreased awareness often neglecting device. VSS during session. See info below regarding dizziness in bed. Patient will benefit from intensive inpatient follow up therapy, >3 hours/day. Pt amenable to short term post-acute rehab. We will progress acutely, aggressively and updated progress during admission. Pt currently with functional limitations due to the deficits listed below (see PT Problem List). Pt will benefit from acute skilled PT to increase their independence and safety with mobility to allow discharge.     Subjective severe vertigo-like dizziness reported as pt returned to supine, noted nystagmus. Assessed further with horizontal roll testing and dix-hallpike. There was a direction changing nystagmus indicative of central source however, pt mostly symptomatic in Rt dix-hallpike which could sugguest some superimposed Rt posterior canalithiasis. Symptomatic in all positions assessed. +vomiting. Not treated with CRM today due to N&V with severe positional sensitivity. Suggest reassess and treat Rt postior canal next visit if signs still  present. (Details: Rt dix-hallpike with Rt rotational, upbeat nystagmus, Lt dixhallpike pure torsional (minimal and delayed), Rt sidelying (symptomatic, difficult to assess direction due to closing eyes,) Lt sidelying apogeotropic). In each position symptoms resolved <60 seconds. Modified bed for comfort with testing due to back pain. Tx may take a few visits to weed-out which canals are involved. If not responding to canalith repositioning maneuvers then vertigo may simply be secondary to SDH, but I suspect there may be a peripheral component at this time. Pt confirms it was not present prior to fall, nor the source of his fall.      If plan is discharge home, recommend the following: A little help with walking and/or transfers;A little help with bathing/dressing/bathroom;Assistance with cooking/housework;Direct supervision/assist for medications management;Direct supervision/assist for financial management;Assist for transportation;Help with stairs or ramp for entrance;Supervision due to cognitive status   Can travel by private vehicle        Equipment Recommendations None recommended by PT  Recommendations for Other Services  Rehab consult    Functional Status Assessment Patient has had a recent decline in their functional status and demonstrates the ability to make significant improvements in function in a reasonable and predictable amount of time.     Precautions / Restrictions Precautions Precautions: Fall Precaution Comments: BPPV component Restrictions Weight Bearing Restrictions: No      Mobility  Bed Mobility Overal bed mobility: Needs Assistance Bed Mobility: Supine to Sit     Supine to sit: Mod assist, HOB elevated, Used rails     General bed mobility comments: Increased time for supine to sit secondary to lethargy.  Assist with all aspects bringing LEs off the bed, rolling to the right side, and then transitioning to sitting.  He was able to transition back to  supine  with min guard but was limited by abrupt dizziness. Cues throughout to facilitate reach.    Transfers Overall transfer level: Needs assistance Equipment used: Rolling walker (2 wheels) Transfers: Sit to/from Stand, Bed to chair/wheelchair/BSC Sit to Stand: Min assist   Step pivot transfers: Min assist       General transfer comment: Min assist for boost and balance from bed, min guard with use of rail in bathroom. Utilized RW for support upon standing. Slow and effortful rise.    Ambulation/Gait Ambulation/Gait assistance: Min assist Gait Distance (Feet): 15 Feet (x2) Assistive device: Rolling walker (2 wheels), None Gait Pattern/deviations: Step-through pattern, Decreased stride length, Shuffle, Antalgic, Knee flexed in stance - left, Knee flexed in stance - right Gait velocity: decr Gait velocity interpretation: <1.31 ft/sec, indicative of household ambulator   General Gait Details: Slow and slightly shuffled, min assist without AD for balance. Requires intermittent cues for simple direction with turns and tasks. With RW CGA, reduced awareness, pushing RW to the side at times. No overt buckling however antalgic on Lt and increased sway present. SpO2 94% on RA  Stairs            Wheelchair Mobility     Tilt Bed    Modified Rankin (Stroke Patients Only)       Balance Overall balance assessment: Needs assistance Sitting-balance support: No upper extremity supported, Feet supported Sitting balance-Leahy Scale: Fair Sitting balance - Comments: Able to maintain balance while sitting EOB. Initially with Lt lean and increased trunk sway, but improved as he became more alert.   Standing balance support: During functional activity Standing balance-Leahy Scale: Poor Standing balance comment: Pt with increased LOB in standing and with mobility. CGA all times                             Pertinent Vitals/Pain Pain Assessment Pain Assessment: Faces Faces Pain  Scale: Hurts little more Pain Location: back pain Pain Descriptors / Indicators: Discomfort, Crushing Pain Intervention(s): Monitored during session, Repositioned    Home Living Family/patient expects to be discharged to:: Private residence Living Arrangements: Alone Available Help at Discharge: Family;Available PRN/intermittently (Limited) Type of Home: House Home Access: Stairs to enter Entrance Stairs-Rails: Right Entrance Stairs-Number of Steps: 3-4   Home Layout: One level Home Equipment: Agricultural consultant (2 wheels);Shower seat;Grab bars - tub/shower      Prior Function Prior Level of Function : Patient poor historian/Family not available             Mobility Comments: walks without an assistive device ADLs Comments: Reports driving and getting his own groceries     Extremity/Trunk Assessment   Upper Extremity Assessment Upper Extremity Assessment: Defer to OT evaluation    Lower Extremity Assessment Lower Extremity Assessment: Generalized weakness (no focal deficits appreciated. Pt reports chronic issues with knees, antalgic when WB through LLE during gait.)    Cervical / Trunk Assessment Cervical / Trunk Assessment: Normal  Communication   Communication Communication: No apparent difficulties  Cognition Arousal: Lethargic, Alert Behavior During Therapy: WFL for tasks assessed/performed Overall Cognitive Status: No family/caregiver present to determine baseline cognitive functioning                                 General Comments: Pt initially hard to arouse but with increased time and transfer to the EOB, he became more alert and maintained  sustained attention.  Pt oriented to place, year, and month, but not day of the week stating "Tuesday".   Pleasant throughout but at times slightly impulsive with sit to stand from toilet.        General Comments General comments (skin integrity, edema, etc.): subjective dizziness as pt returned to supine,  noted nystagmus. Assessed further with horizontal roll testing and dix-hallpike.Direction changing nystagmus indicative of central source however pt mostly symptomatic in Rt dix-hallpike which could sugguest some superimposed Rt posterior canalithiasis. N&V+ in all positions assessed. Not treated with CRM due to N&V with severe positional sensitivity. Suggest reassess and treat Rt postior canal next visit if signs still present. (Details: Rt dix-hallpike with Rt rotational, upbeat nystagmus, Lt dixhallpike pure torsional (minimal and delayed), Rt sidelying (symptomatic, difficult to assess direction due to closing eyes,) Lt sidelying apogeotropic). In each position symptoms resolved <60 seconds. Modified bed for comfort with testing due to back pain.    Exercises     Assessment/Plan    PT Assessment Patient needs continued PT services  PT Problem List Decreased strength;Decreased activity tolerance;Decreased balance;Decreased mobility;Decreased cognition;Decreased knowledge of use of DME;Decreased safety awareness;Decreased knowledge of precautions;Pain       PT Treatment Interventions DME instruction;Gait training;Stair training;Functional mobility training;Therapeutic activities;Therapeutic exercise;Balance training;Neuromuscular re-education;Cognitive remediation;Patient/family education;Modalities;Canalith reposition    PT Goals (Current goals can be found in the Care Plan section)  Acute Rehab PT Goals Patient Stated Goal: Feel better PT Goal Formulation: With patient Time For Goal Achievement: 09/23/23 Potential to Achieve Goals: Good    Frequency Min 1X/week     Co-evaluation PT/OT/SLP Co-Evaluation/Treatment: Yes Reason for Co-Treatment: For patient/therapist safety;Complexity of the patient's impairments (multi-system involvement);Necessary to address cognition/behavior during functional activity;To address functional/ADL transfers PT goals addressed during session:  Mobility/safety with mobility;Balance;Proper use of DME OT goals addressed during session: ADL's and self-care       AM-PAC PT "6 Clicks" Mobility  Outcome Measure Help needed turning from your back to your side while in a flat bed without using bedrails?: A Little Help needed moving from lying on your back to sitting on the side of a flat bed without using bedrails?: A Lot Help needed moving to and from a bed to a chair (including a wheelchair)?: A Little Help needed standing up from a chair using your arms (e.g., wheelchair or bedside chair)?: A Little Help needed to walk in hospital room?: A Little Help needed climbing 3-5 steps with a railing? : A Lot 6 Click Score: 16    End of Session Equipment Utilized During Treatment: Gait belt Activity Tolerance: Patient tolerated treatment well Patient left: in bed;with call bell/phone within reach;with bed alarm set;with nursing/sitter in room Nurse Communication: Mobility status PT Visit Diagnosis: Unsteadiness on feet (R26.81);Other abnormalities of gait and mobility (R26.89);History of falling (Z91.81);Muscle weakness (generalized) (M62.81);BPPV;Dizziness and giddiness (R42);Pain;Other symptoms and signs involving the nervous system (R29.898) BPPV - Right/Left :  (suspect Rt posterior) Pain - part of body:  (back)    Time: 5784-6962 PT Time Calculation (min) (ACUTE ONLY): 50 min   Charges:   PT Evaluation $PT Eval Moderate Complexity: 1 Mod PT Treatments $Therapeutic Activity: 8-22 mins PT General Charges $$ ACUTE PT VISIT: 1 Visit         Kathlyn Sacramento, PT, DPT [facility]  Rehabilitation Services Physical Therapist Office: [phone] Website: [email or web address]   Berton Mount 09/09/2023, 2:06 PM

## 2023-09-09 NOTE — Progress Notes (Signed)
Patient refusing walking O2 at this time d/t dizziness.

## 2023-09-09 NOTE — Progress Notes (Signed)
Inpatient Rehab Admissions Coordinator Note:   Per therapy recommendations patient was screened for CIR candidacy by Stephania Fragmin, PT. At this time, pt appears to be a potential candidate for CIR. I will place an order for rehab consult for full assessment, per our protocol.  Please contact me any with questions.Estill Dooms, PT, DPT [phone] 09/09/23 4:25 PM

## 2023-09-09 NOTE — Progress Notes (Signed)
SATURATION QUALIFICATIONS: (This note is used to comply with regulatory documentation for home oxygen)  Patient Saturations on Room Air at Rest = 98%  Patient Saturations on Room Air while Ambulating = 93%  No O2 needed at this time.

## 2023-09-09 NOTE — Consult Note (Signed)
Chief Complaint   Chief Complaint  Patient presents with   Level 2, Fall on Thinners    History of Present Illness  Mathew Simpson is a 87 y.o. male admitted to the hospital after presenting to the emergency department after a fall.  Patient was apparently at home alone, heard to have fallen by one of his neighbors.  He was found to have a large laceration on the back of his head.  The patient is on Eliquis for atrial fibrillation.  CT scan was therefore obtained which demonstrated a small intracranial hemorrhage and neurosurgical consultation was requested.  Currently, the patient does report some mild headache and generalized bodyaches which she says is normal for him.  He is maintained on chronic hydrocodone for orthopedic issues including back and knee pain.  He does not report any significant neck pain.  He does not report any new numbness tingling or weakness of the arms or legs.  Of note, the patient reports a history of hypertension and prediabetes.  No known previous heart attack or stroke.  He does not report any known lung, liver, or kidney disease.  He is on Eliquis for atrial fibrillation as above.  No cancer history.  He is a non-smoker.  Past Medical History   Past Medical History:  Diagnosis Date   Arthritis    back,   DJD (degenerative joint disease)    Dysrhythmia    A-fib   Glaucoma    Hypertension     Past Surgical History   Past Surgical History:  Procedure Laterality Date   EYE SURGERY     bil    JOINT REPLACEMENT     knee surgery     arthroscopic   TOTAL KNEE ARTHROPLASTY Left 09/12/2019   Procedure: TOTAL KNEE ARTHROPLASTY;  Surgeon: Ollen Gross, MD;  Location: [facility];  Service: Orthopedics;  Laterality: Left;    TOTAL KNEE ARTHROPLASTY Right 03/12/2020   Procedure: TOTAL KNEE ARTHROPLASTY;  Surgeon: Ollen Gross, MD;  Location: [facility];  Service: Orthopedics;  Laterality: Right;     Social History   Social History   Tobacco Use    Smoking status: Former    Current packs/day: 2.00    Average packs/day: 2.0 packs/day for 10.0 years (20.0 ttl pk-yrs)    Types: Cigarettes   Smokeless tobacco: Never   Tobacco comments:    quit in 20's  Vaping Use   Vaping status: Never Used  Substance Use Topics   Alcohol use: Not Currently   Drug use: Not Currently    Medications   Prior to Admission medications   Medication Sig Start Date End Date Taking? Authorizing Provider  apixaban (ELIQUIS) 5 MG TABS tablet Take 5 mg by mouth 2 (two) times daily.   Yes [provider]  Calcium Citrate-Vitamin D (CALCIUM + D PO) Take 1 tablet by mouth 2 (two) times daily.   Yes [provider]  Cholecalciferol (DIALYVITE VITAMIN D 5000) 125 MCG (5000 UT) capsule Take 5,000 Units by mouth every evening.    Yes [provider]  diltiazem (CARDIZEM CD) 180 MG 24 hr capsule Take 180 mg by mouth daily.   Yes [provider]  HYDROcodone-acetaminophen (NORCO) 10-325 MG tablet Take 1 tablet by mouth 4 (four) times daily. 07/23/22  Yes Gherghe, Daylene Katayama, MD  irbesartan (AVAPRO) 150 MG tablet Take 150 mg by mouth daily. 07/14/22  Yes [provider]  ketorolac (ACULAR) 0.5 % ophthalmic solution Place 1 drop into  the left eye 4 (four) times daily. 07/23/22  Yes Leatha Gilding, MD  Multiple Vitamin (MULTIVITAMIN WITH MINERALS) TABS tablet Take 1 tablet by mouth daily at 2 PM.    Yes [provider]  Multiple Vitamins-Minerals (PRESERVISION AREDS 2 PO) Take 1 tablet by mouth in the morning and at bedtime.   Yes [provider]  TIADYLT ER 180 MG 24 hr capsule Take 180 mg by mouth daily. 05/05/22  Yes [provider]  ciprofloxacin (CILOXAN) 0.3 % ophthalmic solution Place 1 drop into the left eye every 4 (four) hours while awake. Administer 1 drop, every 2 hours, while awake, for 2 days. Then 1 drop, every 4 hours, while awake, for the next 5 days. Patient not taking: Reported on 09/08/2023  07/23/22   Leatha Gilding, MD    Allergies   Allergies  Allergen Reactions   Penicillins Anaphylaxis    Did it involve swelling of the face/tongue/throat, SOB, or low BP? Yes Did it involve sudden or severe rash/hives, skin peeling, or any reaction on the inside of your mouth or nose? Unknown Did you need to seek medical attention at a hospital or doctor's office? Yes When did it last happen?      50+ years If all above answers are "NO", may proceed with cephalosporin use.     Review of Systems  ROS  Neurologic Exam  Awake, alert, oriented Memory and concentration grossly intact Speech fluent, appropriate CN grossly intact Full AROM of neck without pain Motor exam: Upper Extremities Deltoid Bicep Tricep Grip  Right 5/5 5/5 5/5 5/5  Left 5/5 5/5 5/5 5/5   Lower Extremities IP Quad PF DF EHL  Right 5/5 5/5 5/5 5/5 5/5  Left 5/5 5/5 5/5 5/5 5/5   Sensation grossly intact to LT  Imaging  CT scan dated 09/08/2023 and earlier this morning were both reviewed and are stable.  These scans demonstrate very small contusion within the parasagittal left frontal region.  There is also a small anterior fall seen subdural hematoma.  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  There is calcific atherosclerotic disease involving bilateral vertebral arteries.  CT scan of the cervical spine also personally reviewed.  No acute fractures are noted.  There is rotational offset of the atlas on the axis however I do not see any significant misalignment of the C1-C2 joint itself.  Impression  - 87 y.o. male status post fall on Eliquis, neurologically at baseline.  CT scan demonstrates stable appearance of small anterior falcine subdural and left frontal contusion.  These do not require any acute intervention.  While CT scan does demonstrate rotational offset of C1 on C2, the patient has full range of motion without significant neck pain and no neurologic deficit.  I  therefore think rotational subluxation is highly unlikely.  Plan  -Continue supportive care per [facility] -Would hold Eliquis for 2 weeks -Patient does not require cervical immobilization -Patient can follow-up in outpatient neurosurgery clinic on a as needed basis  I have reviewed the above plan with the patient and family at bedside. All questions were answered.   Lisbeth Renshaw, MD [facility]

## 2023-09-09 NOTE — Evaluation (Signed)
Occupational Therapy Evaluation Patient Details Name: Mathew Simpson MRN: [id number] DOB: [birth year] Today's Date: 09/09/2023   History of Present Illness ISMAILA PELSTER is a 87 y.o. male admitted to the hospital after presenting to the emergency department after a fall.  CT scan was therefore obtained which demonstrated a small intracranial hemorrhage in the left frontal/parietal region.  PMH for hypertension, glaucoma, hx of B TKR.   Clinical Impression   Pt currently at min assist level for toilet transfers and LB selfcare sit to stand this session without use of an assistive device.  Increased dizziness noted with transition from supine to sit and with rolling in the bed.  Pt with positive Dix Hallpike test for right posterior canalithiasis with possible left ear involvement.  Will continue to assess further in treatment as pt with increased back pain, and nausea limiting testing.  Per pt he lives alone with some family in the area but no consistent assist.  Based on current findings, feel he will benefit from acute care OT at this time to help progress back to a more independent level for selfcare tasks and ADLs.  Recommend that he will benefit from intensive inpatient follow up therapy, >3 hours/day post acute since he needs to reach modified independent level for home.           If plan is discharge home, recommend the following: A little help with walking and/or transfers;A little help with bathing/dressing/bathroom;Assist for transportation;Help with stairs or ramp for entrance;Assistance with cooking/housework    Functional Status Assessment  Patient has had a recent decline in their functional status and demonstrates the ability to make significant improvements in function in a reasonable and predictable amount of time.  Equipment Recommendations  None recommended by OT    Recommendations for Other Services Rehab consult     Precautions / Restrictions Precautions Precautions:  Fall Precaution Comments: BPPV component Restrictions Weight Bearing Restrictions: No      Mobility Bed Mobility Overal bed mobility: Needs Assistance Bed Mobility: Supine to Sit     Supine to sit: Mod assist, HOB elevated, Used rails     General bed mobility comments: Increased time for supine to sit secondary to lethargy.  Assist with all aspects bringing LEs off the bed, rolling to the right side, and then transitioning to sitting.  He was able to transition back to supine with min guard but was limited by abrupt dizziness.    Transfers Overall transfer level: Needs assistance Equipment used: None Transfers: Sit to/from Stand, Bed to chair/wheelchair/BSC Sit to Stand: Min assist     Step pivot transfers: Min assist            Balance Overall balance assessment: Needs assistance   Sitting balance-Leahy Scale: Fair Sitting balance - Comments: Able to maintain balance while sitting EOB   Standing balance support: During functional activity Standing balance-Leahy Scale: Poor Standing balance comment: Pt with increased LOB in standing and with mobility.                           ADL either performed or assessed with clinical judgement   ADL Overall ADL's : Needs assistance/impaired Eating/Feeding: Independent;Sitting Eating/Feeding Details (indicate cue type and reason): drink from cup Grooming: Wash/dry hands;Minimal assistance;Standing   Upper Body Bathing: Supervision/ safety;Sitting   Lower Body Bathing: Minimal assistance;Sit to/from stand Lower Body Bathing Details (indicate cue type and reason): simulated Upper Body Dressing : Set up;Sitting Upper Body  Dressing Details (indicate cue type and reason): simulated Lower Body Dressing: Minimal assistance;Sit to/from stand   Toilet Transfer: Minimal assistance;Ambulation;Regular Toilet;Grab bars   Toileting- Clothing Manipulation and Hygiene: Minimal assistance;Sit to/from stand        Functional mobility during ADLs: Minimal assistance (short distance ambulation without assistive device) General ADL Comments: Pt with increased dizziness when transitioning from sitting to supine.  Noted upward beating tortional nystagmus to the right which lasted for approximately 10 seconds.  With further vestibular testing pt demonstrated positive right posterior canal BPPV.  With Gilberto Better to the left ear, there was some slight nystagmus noted but not as severe.  Increased fall risk noted secondary to balance deficits and dizziness.     Vision Baseline Vision/History: 1 Wears glasses Ability to See in Adequate Light: 0 Adequate Patient Visual Report: No change from baseline Vision Assessment?: Yes Eye Alignment: Within Functional Limits Ocular Range of Motion: Within Functional Limits Alignment/Gaze Preference: Within Defined Limits Tracking/Visual Pursuits: Decreased smoothness of horizontal tracking;Decreased smoothness of vertical tracking;Other (comment) (multiple losses of fixation in all areas when attempting to track therapist's finger.) Visual Fields: No apparent deficits (not formally assessed)     Perception Perception: Within Functional Limits       Praxis Praxis: WFL       Pertinent Vitals/Pain Pain Assessment Pain Assessment: Faces Faces Pain Scale: Hurts little more Pain Location: back pain Pain Descriptors / Indicators: Discomfort, Crushing Pain Intervention(s): Repositioned, Monitored during session, Limited activity within patient's tolerance     Extremity/Trunk Assessment Upper Extremity Assessment Upper Extremity Assessment: Overall WFL for tasks assessed   Lower Extremity Assessment Lower Extremity Assessment: Defer to PT evaluation   Cervical / Trunk Assessment Cervical / Trunk Assessment: Normal   Communication Communication Communication: No apparent difficulties   Cognition Arousal: Lethargic, Alert Behavior During Therapy: WFL for tasks  assessed/performed Overall Cognitive Status: No family/caregiver present to determine baseline cognitive functioning                                 General Comments: Pt initially hard to arouse but with increased time and transfer to the EOB, he became more alert and maintained sustained attention.  Pt oriented to place, year, and month, but not day of the week stating "Tuesday".   Pleasant throughout but at times slightly impulsive with sit to stand from toilet.                Home Living Family/patient expects to be discharged to:: Private residence Living Arrangements: Alone   Type of Home: House Home Access: Stairs to enter [employer] of Steps: 3-4 Entrance Stairs-Rails: Right Home Layout: One level     Bathroom Shower/Tub: Producer, television/film/video: Standard     Home Equipment: Agricultural consultant (2 wheels);Shower seat;Grab bars - tub/shower          Prior Functioning/Environment Prior Level of Function : Patient poor historian/Family not available             Mobility Comments: walks without an assistive device ADLs Comments: Reports driving and getting his own groceries        OT Problem List: Other (comment);Impaired balance (sitting and/or standing);Decreased activity tolerance;Decreased knowledge of use of DME or AE;Pain (vestibular symptoms)      OT Treatment/Interventions: Self-care/ADL training;Canalith reposition;Therapeutic activities;Patient/family education;DME and/or AE instruction;Neuromuscular education    OT Goals(Current goals can be found in the care  plan section) Acute Rehab OT Goals Patient Stated Goal: Pt did not state but agreeable to participation with OT OT Goal Formulation: With patient Time For Goal Achievement: 09/23/23 Potential to Achieve Goals: Good  OT Frequency: Min 1X/week    Co-evaluation PT/OT/SLP Co-Evaluation/Treatment: Yes Reason for Co-Treatment: For patient/therapist safety   OT  goals addressed during session: ADL's and self-care      AM-PAC OT "6 Clicks" Daily Activity     Outcome Measure Help from another person eating meals?: None Help from another person taking care of personal grooming?: A Little Help from another person toileting, which includes using toliet, bedpan, or urinal?: A Little Help from another person bathing (including washing, rinsing, drying)?: A Little Help from another person to put on and taking off regular upper body clothing?: A Little Help from another person to put on and taking off regular lower body clothing?: A Little 6 Click Score: 19   End of Session Equipment Utilized During Treatment: Gait belt Nurse Communication: Mobility status  Activity Tolerance: Patient tolerated treatment well Patient left: in bed;with call bell/phone within reach;with bed alarm set  OT Visit Diagnosis: Unsteadiness on feet (R26.81);Other abnormalities of gait and mobility (R26.89);Repeated falls (R29.6);Pain Pain - Right/Left:  (back pain)                Time: 9629-5284 OT Time Calculation (min): 48 min Charges:  OT General Charges $OT Visit: 1 Visit OT Evaluation $OT Eval Moderate Complexity: 1 Mod  Perrin Maltese, OTR/L Acute Rehabilitation Services  Office [phone] 09/09/2023

## 2023-09-10 DIAGNOSIS — S065XAA Traumatic subdural hemorrhage with loss of consciousness status unknown, initial encounter: Secondary | ICD-10-CM | POA: Diagnosis not present

## 2023-09-10 DIAGNOSIS — S069X1D Unspecified intracranial injury with loss of consciousness of 30 minutes or less, subsequent encounter: Secondary | ICD-10-CM | POA: Diagnosis not present

## 2023-09-10 DIAGNOSIS — W19XXXA Unspecified fall, initial encounter: Secondary | ICD-10-CM | POA: Diagnosis not present

## 2023-09-10 LAB — GLUCOSE, CAPILLARY
Glucose-Capillary: 118 mg/dL — ABNORMAL HIGH (ref 70–99)
Glucose-Capillary: 149 mg/dL — ABNORMAL HIGH (ref 70–99)
Glucose-Capillary: 183 mg/dL — ABNORMAL HIGH (ref 70–99)
Glucose-Capillary: 152 mg/dL — ABNORMAL HIGH (ref 70–99)
Glucose-Capillary: 144 mg/dL — ABNORMAL HIGH (ref 70–99)

## 2023-09-10 LAB — CBC WITH DIFFERENTIAL/PLATELET
Abs Immature Granulocytes: 0.02 K/uL (ref 0.00–0.07)
Basophils Absolute: 0.1 K/uL (ref 0.0–0.1)
Basophils Relative: 1 %
Eosinophils Absolute: 0.2 K/uL (ref 0.0–0.5)
Eosinophils Relative: 3 %
HCT: 36.7 % — ABNORMAL LOW (ref 39.0–52.0)
Hemoglobin: 12.2 g/dL — ABNORMAL LOW (ref 13.0–17.0)
Immature Granulocytes: 0 %
Lymphocytes Relative: 17 %
Lymphs Abs: 1.2 K/uL (ref 0.7–4.0)
MCH: 30 pg (ref 26.0–34.0)
MCHC: 33.2 g/dL (ref 30.0–36.0)
MCV: 90.2 fL (ref 80.0–100.0)
Monocytes Absolute: 0.5 K/uL (ref 0.1–1.0)
Monocytes Relative: 8 %
Neutro Abs: 4.8 K/uL (ref 1.7–7.7)
Neutrophils Relative %: 71 %
Platelets: 141 K/uL — ABNORMAL LOW (ref 150–400)
RBC: 4.07 MIL/uL — ABNORMAL LOW (ref 4.22–5.81)
RDW: 13.6 % (ref 11.5–15.5)
WBC: 6.8 K/uL (ref 4.0–10.5)
nRBC: 0 % (ref 0.0–0.2)

## 2023-09-10 LAB — PHOSPHORUS: Phosphorus: 2.9 mg/dL (ref 2.5–4.6)

## 2023-09-10 LAB — COMPREHENSIVE METABOLIC PANEL
ALT: 17 U/L (ref 0–44)
AST: 20 U/L (ref 15–41)
Albumin: 3.4 g/dL — ABNORMAL LOW (ref 3.5–5.0)
Alkaline Phosphatase: 71 U/L (ref 38–126)
Anion gap: 10 (ref 5–15)
BUN: 14 mg/dL (ref 8–23)
CO2: 25 mmol/L (ref 22–32)
Calcium: 8.7 mg/dL — ABNORMAL LOW (ref 8.9–10.3)
Chloride: 104 mmol/L (ref 98–111)
Creatinine, Ser: 1.05 mg/dL (ref 0.61–1.24)
GFR, Estimated: >60 mL/min (ref >60)
Glucose, Bld: 125 mg/dL — ABNORMAL HIGH (ref 70–99)
Potassium: 3.5 mmol/L (ref 3.5–5.1)
Sodium: 139 mmol/L (ref 135–145)
Total Bilirubin: 1.5 mg/dL — ABNORMAL HIGH (ref 0.3–1.2)
Total Protein: 6.2 g/dL — ABNORMAL LOW (ref 6.5–8.1)

## 2023-09-10 LAB — MAGNESIUM: Magnesium: 2 mg/dL (ref 1.7–2.4)

## 2023-09-10 MED ORDER — ENOXAPARIN SODIUM 40 MG/0.4ML IJ SOSY
40.0000 mg | PREFILLED_SYRINGE | INTRAMUSCULAR | Status: DC
  Administered 2023-09-10 – 2023-09-19 (×10): 40 mg via SUBCUTANEOUS
  Filled 2023-09-10 (×11): qty 0.4

## 2023-09-10 MED ORDER — ADULT MULTIVITAMIN W/MINERALS CH
1.0000 | ORAL_TABLET | Freq: Every day | ORAL | Status: DC
  Administered 2023-09-10 – 2023-10-01 (×22): 1 via ORAL
  Filled 2023-09-10 (×22): qty 1

## 2023-09-10 MED ORDER — IRBESARTAN 300 MG PO TABS
300.0000 mg | ORAL_TABLET | Freq: Every day | ORAL | Status: DC
  Administered 2023-09-10 – 2023-10-01 (×22): 300 mg via ORAL
  Filled 2023-09-10 (×23): qty 1

## 2023-09-10 MED ORDER — INSULIN ASPART 100 UNIT/ML IJ SOLN
0.0000 [IU] | Freq: Three times a day (TID) | INTRAMUSCULAR | Status: DC
  Administered 2023-09-11 (×2): 1 [IU] via SUBCUTANEOUS
  Administered 2023-09-11: 3 [IU] via SUBCUTANEOUS
  Administered 2023-09-12: 1 [IU] via SUBCUTANEOUS
  Administered 2023-09-12: 2 [IU] via SUBCUTANEOUS
  Administered 2023-09-13 (×2): 1 [IU] via SUBCUTANEOUS
  Administered 2023-09-14: 2 [IU] via SUBCUTANEOUS
  Administered 2023-09-15: 3 [IU] via SUBCUTANEOUS
  Administered 2023-09-15 – 2023-09-16 (×2): 2 [IU] via SUBCUTANEOUS
  Administered 2023-09-16: 1 [IU] via SUBCUTANEOUS
  Administered 2023-09-16: 2 [IU] via SUBCUTANEOUS
  Administered 2023-09-17: 3 [IU] via SUBCUTANEOUS
  Administered 2023-09-17 – 2023-09-18 (×2): 1 [IU] via SUBCUTANEOUS
  Administered 2023-09-18 – 2023-09-19 (×2): 2 [IU] via SUBCUTANEOUS
  Administered 2023-09-19 – 2023-09-20 (×2): 1 [IU] via SUBCUTANEOUS
  Administered 2023-09-20: 2 [IU] via SUBCUTANEOUS
  Administered 2023-09-20: 5 [IU] via SUBCUTANEOUS
  Administered 2023-09-21: 2 [IU] via SUBCUTANEOUS
  Administered 2023-09-21: 3 [IU] via SUBCUTANEOUS
  Administered 2023-09-21 – 2023-09-22 (×2): 2 [IU] via SUBCUTANEOUS
  Administered 2023-09-22: 1 [IU] via SUBCUTANEOUS
  Administered 2023-09-22: 2 [IU] via SUBCUTANEOUS
  Administered 2023-09-23: 1 [IU] via SUBCUTANEOUS
  Administered 2023-09-23 – 2023-09-24 (×5): 2 [IU] via SUBCUTANEOUS
  Administered 2023-09-25: 1 [IU] via SUBCUTANEOUS
  Administered 2023-09-25: 5 [IU] via SUBCUTANEOUS
  Administered 2023-09-25: 2 [IU] via SUBCUTANEOUS
  Administered 2023-09-26 (×2): 1 [IU] via SUBCUTANEOUS
  Administered 2023-09-26: 2 [IU] via SUBCUTANEOUS
  Administered 2023-09-27: 1 [IU] via SUBCUTANEOUS
  Administered 2023-09-27: 2 [IU] via SUBCUTANEOUS
  Administered 2023-09-27 – 2023-09-28 (×2): 1 [IU] via SUBCUTANEOUS
  Administered 2023-09-29: 9 [IU] via SUBCUTANEOUS
  Administered 2023-09-29: 2 [IU] via SUBCUTANEOUS
  Administered 2023-09-29: 3 [IU] via SUBCUTANEOUS
  Administered 2023-09-30 (×2): 2 [IU] via SUBCUTANEOUS
  Administered 2023-09-30: 5 [IU] via SUBCUTANEOUS
  Administered 2023-10-01: 3 [IU] via SUBCUTANEOUS
  Administered 2023-10-01: 1 [IU] via SUBCUTANEOUS

## 2023-09-10 MED ORDER — ENSURE ENLIVE PO LIQD
237.0000 mL | Freq: Two times a day (BID) | ORAL | Status: DC
  Administered 2023-09-10 – 2023-09-18 (×9): 237 mL via ORAL

## 2023-09-10 MED ORDER — POLYETHYLENE GLYCOL 3350 17 G PO PACK
17.0000 g | PACK | Freq: Two times a day (BID) | ORAL | Status: DC
  Administered 2023-09-11 – 2023-10-01 (×27): 17 g via ORAL
  Filled 2023-09-10 (×36): qty 1

## 2023-09-10 MED ORDER — INSULIN ASPART 100 UNIT/ML IJ SOLN
0.0000 [IU] | Freq: Every day | INTRAMUSCULAR | Status: DC
  Administered 2023-09-20: 5 [IU] via SUBCUTANEOUS
  Administered 2023-09-23 – 2023-09-24 (×2): 2 [IU] via SUBCUTANEOUS

## 2023-09-10 MED ORDER — IRBESARTAN 300 MG PO TABS: 300.0000 mg | ORAL_TABLET | Freq: Every day | ORAL | Status: DC

## 2023-09-10 MED ORDER — HYDRALAZINE HCL 20 MG/ML IJ SOLN: 5.0000 mg | Freq: Once | INTRAMUSCULAR | Status: DC | PRN

## 2023-09-10 MED ORDER — IRBESARTAN 150 MG PO TABS
150.0000 mg | ORAL_TABLET | ORAL | Status: AC
  Administered 2023-09-10: 150 mg via ORAL
  Filled 2023-09-10: qty 1

## 2023-09-10 NOTE — TOC Initial Note (Signed)
Transition of Care [facility]) - Initial/Assessment Note    Patient Details  Name: Mathew Simpson MRN: [id number] Date of Birth: Jul 04, 1936  [facility] CM/SW Contact:    Baldemar Lenis, LCSW Phone Number: 09/10/2023, 3:02 PM  Clinical Narrative:      CSW updated by Rehab Admissions that patient will need SNF. CSW faxed out referral and met with patient to discuss options. Patient asked CSW to contact granddaughter, [name]. CSW spoke with [facility] and discussed options, she will review and update CSW with choice. CSW to follow.             Expected Discharge Plan: Skilled Nursing Facility Barriers to Discharge: Continued Medical Work up, English as a second language teacher   Patient Goals and CMS Choice Patient states their goals for this hospitalization and ongoing recovery are:: to get rehab CMS Medicare.gov Compare Post Acute Care list provided to:: Patient Choice offered to / list presented to : Patient [place] ownership interest in [facility].provided to:: Patient    Expected Discharge Plan and Services     Post Acute Care Choice: Skilled Nursing Facility Living arrangements for the past 2 months: Single Family Home                                      Prior Living Arrangements/Services Living arrangements for the past 2 months: Single Family Home Lives with:: Self Patient language and need for interpreter reviewed:: No Do you feel safe going back to the place where you live?: Yes      Need for Family Participation in Patient Care: Yes (Comment) Care giver support system in place?: No (comment) Current home services: DME Criminal Activity/Legal Involvement Pertinent to Current Situation/Hospitalization: No - Comment as needed  Activities of Daily Living      Permission Sought/Granted Permission sought to share information with : Facility Medical sales representative, Family Supports Permission granted to share information with : Yes, Verbal  Permission Granted  Share Information with NAME: Thomasa  Permission granted to share info w AGENCY: SNF  Permission granted to share info w Relationship: Granddaughter     Emotional Assessment Appearance:: Appears stated age Attitude/Demeanor/Rapport: Engaged Affect (typically observed): Appropriate Orientation: : Oriented to Self, Oriented to Place, Oriented to  Time, Oriented to Situation Alcohol / Substance Use: Not Applicable Psych Involvement: No (comment)  Admission diagnosis:  Subdural hematoma (HCC) [S06.5XAA] Fall, initial encounter [id number]] Laceration of scalp, initial encounter [S01.01XA] Bleeding in head following injury with loss of consciousness (HCC) [S06.309A] Patient Active Problem List   Diagnosis Date Noted   Bleeding in head following injury with loss of consciousness (HCC) 09/08/2023   Subdural hematoma (HCC) 09/08/2023   DM2 (diabetes mellitus, type 2) (HCC) 09/08/2023   Neck injury 09/08/2023   Malnutrition of moderate degree 07/18/2022   Cellulitis of left foot 07/17/2022   Sepsis due to cellulitis (HCC) 07/17/2022   Chronic atrial fibrillation (HCC) 07/17/2022   Hypokalemia 07/17/2022   Mild protein malnutrition (HCC) 07/17/2022   Hyperbilirubinemia 07/17/2022   Primary osteoarthritis of right knee 03/12/2020   Cellulitis of left lower extremity    Septic joint of left knee joint (HCC) 09/15/2019   Acute blood loss anemia 09/15/2019   Essential hypertension 09/15/2019   OA (osteoarthritis) of knee 09/12/2019   PCP:  [name], PA-C Pharmacy:   [facility] [phone] - [place], [place] - [address] AT [address] [facility] [address] [place] [address] Phone: [phone] Fax: [phone]  [facility] [address] [place] [address] Phone: [phone] Fax: [phone]     Social Determinants of Health (SDOH) Social History: SDOH Screenings   Food Insecurity:  No Food Insecurity (09/10/2023)  Housing: Low Risk  (09/10/2023)  Transportation Needs: No Transportation Needs (09/10/2023)  Utilities: Not At Risk (09/10/2023)  Tobacco Use: Medium Risk (09/08/2023)   SDOH Interventions:     Readmission Risk Interventions    07/23/2022   11:40 AM 07/21/2022   10:11 AM  Readmission Risk Prevention Plan  Post Dischage Appt Complete Complete  Medication Screening Complete Complete  Transportation Screening Complete Complete

## 2023-09-10 NOTE — Consult Note (Signed)
Physical Medicine and Rehabilitation Consult Reason for Consult:TBI' Referring Physician: Lowell Guitar   HPI: Mathew Simpson is a 87 y.o. male with a history of hypertension, atrial fibrillation (on Eliquis) and bilateral total knee replacements who tripped and fell yesterday at home.  He was found by one of his neighbors with a large laceration on the back of his head.  He does not appear to have lost consciousness but was complaining of dizziness, nausea and headache after the fall.  He was brought to the emergency department and a CT scan of the head was performed which demonstrated a 3 mm subdural hematoma along the anterior falx as well as a 6 mm hemorrhagic contusion in the posterior left frontal lobe.  There was also some concern about the C1 vertebra.  Neurosurgery was consulted and recommended conservative care and holding of Eliquis.  They felt that cervical spine findings were benign and no collar needed.  Patient was up with physical therapy yesterday and vertigo was reported, nystagmus present along with it during movement and Hallpike Dix maneuver.  Patient was mod assist for bed mobility min assist for sit to stand transfers.  He walked 15 feet min assist as well but gait was shuffling and patient demonstrated poor awareness along with dizziness.  Prior to arrival patient appears to have been independent and walking without a device.  He has family at home who can provide intermittent assistance and is 1 level house with 3-4 steps to enter.  Review of Systems  Unable to perform ROS: Mental acuity   Past Medical History:  Diagnosis Date   Arthritis    back,   DJD (degenerative joint disease)    Dysrhythmia    A-fib   Glaucoma    Hypertension    Past Surgical History:  Procedure Laterality Date   EYE SURGERY     bil    JOINT REPLACEMENT     knee surgery     arthroscopic   TOTAL KNEE ARTHROPLASTY Left 09/12/2019   Procedure: TOTAL KNEE ARTHROPLASTY;  Surgeon: Ollen Gross,  MD;  Location: [facility];  Service: Orthopedics;  Laterality: Left;    TOTAL KNEE ARTHROPLASTY Right 03/12/2020   Procedure: TOTAL KNEE ARTHROPLASTY;  Surgeon: Ollen Gross, MD;  Location: [facility];  Service: Orthopedics;  Laterality: Right;    Family History  Problem Relation Age of Onset   Hypertension Other    Social History:  reports that he has quit smoking. His smoking use included cigarettes. He has a 20 pack-year smoking history. He has never used smokeless tobacco. He reports that he does not currently use alcohol. He reports that he does not currently use drugs. Allergies:  Allergies  Allergen Reactions   Penicillins Anaphylaxis    Did it involve swelling of the face/tongue/throat, SOB, or low BP? Yes Did it involve sudden or severe rash/hives, skin peeling, or any reaction on the inside of your mouth or nose? Unknown Did you need to seek medical attention at a hospital or doctor's office? Yes When did it last happen?      50+ years If all above answers are "NO", may proceed with cephalosporin use.    Medications Prior to Admission  Medication Sig Dispense Refill   apixaban (ELIQUIS) 5 MG TABS tablet Take 5 mg by mouth 2 (two) times daily.     Calcium Citrate-Vitamin D (CALCIUM + D PO) Take 1 tablet by mouth 2 (two) times daily.     Cholecalciferol (  DIALYVITE VITAMIN D 5000) 125 MCG (5000 UT) capsule Take 5,000 Units by mouth every evening.      diltiazem (CARDIZEM CD) 180 MG 24 hr capsule Take 180 mg by mouth daily.     HYDROcodone-acetaminophen (NORCO) 10-325 MG tablet Take 1 tablet by mouth 4 (four) times daily. 8 tablet 0   irbesartan (AVAPRO) 150 MG tablet Take 150 mg by mouth daily.     ketorolac (ACULAR) 0.5 % ophthalmic solution Place 1 drop into the left eye 4 (four) times daily. 5 mL 0   Multiple Vitamin (MULTIVITAMIN WITH MINERALS) TABS tablet Take 1 tablet by mouth daily at 2 PM.      Multiple Vitamins-Minerals (PRESERVISION AREDS 2 PO) Take 1 tablet by  mouth in the morning and at bedtime.     TIADYLT ER 180 MG 24 hr capsule Take 180 mg by mouth daily.     ciprofloxacin (CILOXAN) 0.3 % ophthalmic solution Place 1 drop into the left eye every 4 (four) hours while awake. Administer 1 drop, every 2 hours, while awake, for 2 days. Then 1 drop, every 4 hours, while awake, for the next 5 days. (Patient not taking: Reported on 09/08/2023) 5 mL 0    Home: Home Living Family/patient expects to be discharged to:: Private residence Living Arrangements: Alone Available Help at Discharge: Family, Available PRN/intermittently (Limited) Type of Home: House Home Access: Stairs to enter [employer] of Steps: 3-4 Entrance Stairs-Rails: Right Home Layout: One level Bathroom Shower/Tub: Health visitor: Standard Home Equipment: Agricultural consultant (2 wheels), Information systems manager, Grab bars - tub/shower  Functional History: Prior Function Prior Level of Function : Patient poor historian/Family not available Mobility Comments: walks without an assistive device ADLs Comments: Reports driving and getting his own groceries Functional Status:  Mobility: Bed Mobility Overal bed mobility: Needs Assistance Bed Mobility: Supine to Sit Supine to sit: Mod assist, HOB elevated, Used rails General bed mobility comments: Increased time for supine to sit secondary to lethargy.  Assist with all aspects bringing LEs off the bed, rolling to the right side, and then transitioning to sitting.  He was able to transition back to supine with min guard but was limited by abrupt dizziness. Cues throughout to facilitate reach. Transfers Overall transfer level: Needs assistance Equipment used: Rolling walker (2 wheels) Transfers: Sit to/from Stand, Bed to chair/wheelchair/BSC Sit to Stand: Min assist Bed to/from chair/wheelchair/BSC transfer type:: Step pivot Step pivot transfers: Min assist General transfer comment: Min assist for boost and balance from bed, min  guard with use of rail in bathroom. Utilized RW for support upon standing. Slow and effortful rise. Ambulation/Gait Ambulation/Gait assistance: Min assist Gait Distance (Feet): 15 Feet (x2) Assistive device: Rolling walker (2 wheels), None Gait Pattern/deviations: Step-through pattern, Decreased stride length, Shuffle, Antalgic, Knee flexed in stance - left, Knee flexed in stance - right General Gait Details: Slow and slightly shuffled, min assist without AD for balance. Requires intermittent cues for simple direction with turns and tasks. With RW CGA, reduced awareness, pushing RW to the side at times. No overt buckling however antalgic on Lt and increased sway present. SpO2 94% on RA Gait velocity: decr Gait velocity interpretation: <1.31 ft/sec, indicative of household ambulator    ADL: ADL Overall ADL's : Needs assistance/impaired Eating/Feeding: Independent, Sitting Eating/Feeding Details (indicate cue type and reason): drink from cup Grooming: Wash/dry hands, Minimal assistance, Standing Upper Body Bathing: Supervision/ safety, Sitting Lower Body Bathing: Minimal assistance, Sit to/from stand Lower Body Bathing Details (indicate cue  type and reason): simulated Upper Body Dressing : Set up, Sitting Upper Body Dressing Details (indicate cue type and reason): simulated Lower Body Dressing: Minimal assistance, Sit to/from stand Toilet Transfer: Minimal assistance, Ambulation, Regular Toilet, Grab bars Toileting- Clothing Manipulation and Hygiene: Minimal assistance, Sit to/from stand Functional mobility during ADLs: Minimal assistance (short distance ambulation without assistive device) General ADL Comments: Pt with increased dizziness when transitioning from sitting to supine.  Noted upward beating tortional nystagmus to the right which lasted for approximately 10 seconds.  With further vestibular testing pt demonstrated positive right posterior canal BPPV.  With Gilberto Better to the left  ear, there was some slight nystagmus noted but not as severe.  Increased fall risk noted secondary to balance deficits and dizziness.  Cognition: Cognition Overall Cognitive Status: No family/caregiver present to determine baseline cognitive functioning Orientation Level: Oriented X4 Cognition Arousal: Lethargic, Alert Behavior During Therapy: WFL for tasks assessed/performed Overall Cognitive Status: No family/caregiver present to determine baseline cognitive functioning General Comments: Pt initially hard to arouse but with increased time and transfer to the EOB, he became more alert and maintained sustained attention.  Pt oriented to place, year, and month, but not day of the week stating "Tuesday".   Pleasant throughout but at times slightly impulsive with sit to stand from toilet.  Blood pressure (!) 145/89, pulse 74, temperature 98.4 F (36.9 C), temperature source Oral, resp. rate 19, height 5\' 10"  (1.778 m), weight 88.5 kg, SpO2 97%. Physical Exam Constitutional:      Appearance: He is obese.     Comments: somnolent  HENT:     Head:     Comments: Occipital lac    Nose: Nose normal.  Cardiovascular:     Rate and Rhythm: Normal rate.     Pulses: Normal pulses.  Pulmonary:     Effort: Pulmonary effort is normal.  Abdominal:     Palpations: Abdomen is soft.  Musculoskeletal:     Cervical back: Neck supple.  Skin:    General: Skin is warm.     Findings: Bruising present.  Neurological:     Comments: Pt is somnolent. When awakens enough could follow simple commands. Told me he lived in [place]. CN exam is non-focal. Could not check for nystagmus. Moved all 4 limbs and senses pain in all 4's.   Psychiatric:     Comments: Flat but attempted to cooperate when awake enough to do so     Results for orders placed or performed during the hospital encounter of 09/08/23 (from the past 24 hour(s))  Glucose, capillary     Status: Abnormal   Collection Time: 09/09/23 11:47 AM   Result Value Ref Range   Glucose-Capillary 165 (H) 70 - 99 mg/dL  Glucose, capillary     Status: Abnormal   Collection Time: 09/09/23  3:58 PM  Result Value Ref Range   Glucose-Capillary 129 (H) 70 - 99 mg/dL  Glucose, capillary     Status: Abnormal   Collection Time: 09/09/23  8:58 PM  Result Value Ref Range   Glucose-Capillary 165 (H) 70 - 99 mg/dL   Comment 1 Notify RN    Comment 2 Document in Chart   Glucose, capillary     Status: Abnormal   Collection Time: 09/09/23 11:45 PM  Result Value Ref Range   Glucose-Capillary 118 (H) 70 - 99 mg/dL   Comment 1 Notify RN    Comment 2 Document in Chart   Glucose, capillary     Status: Abnormal  Collection Time: 09/10/23  4:13 AM  Result Value Ref Range   Glucose-Capillary 118 (H) 70 - 99 mg/dL   Comment 1 Notify RN    Comment 2 Document in Chart   Glucose, capillary     Status: Abnormal   Collection Time: 09/10/23  7:31 AM  Result Value Ref Range   Glucose-Capillary 149 (H) 70 - 99 mg/dL   Comment 1 Notify RN    Comment 2 Document in Chart   CBC with Differential/Platelet     Status: Abnormal   Collection Time: 09/10/23  8:36 AM  Result Value Ref Range   WBC 6.8 4.0 - 10.5 K/uL   RBC 4.07 (L) 4.22 - 5.81 MIL/uL   Hemoglobin 12.2 (L) 13.0 - 17.0 g/dL   HCT 09.8 (L) 11.9 - 14.7 %   MCV 90.2 80.0 - 100.0 fL   MCH 30.0 26.0 - 34.0 pg   MCHC 33.2 30.0 - 36.0 g/dL   RDW 82.9 56.2 - 13.0 %   Platelets 141 (L) 150 - 400 K/uL   nRBC 0.0 0.0 - 0.2 %   Neutrophils Relative % 71 %   Neutro Abs 4.8 1.7 - 7.7 K/uL   Lymphocytes Relative 17 %   Lymphs Abs 1.2 0.7 - 4.0 K/uL   Monocytes Relative 8 %   Monocytes Absolute 0.5 0.1 - 1.0 K/uL   Eosinophils Relative 3 %   Eosinophils Absolute 0.2 0.0 - 0.5 K/uL   Basophils Relative 1 %   Basophils Absolute 0.1 0.0 - 0.1 K/uL   Immature Granulocytes 0 %   Abs Immature Granulocytes 0.02 0.00 - 0.07 K/uL  Comprehensive metabolic panel     Status: Abnormal   Collection Time: 09/10/23   8:36 AM  Result Value Ref Range   Sodium 139 135 - 145 mmol/L   Potassium 3.5 3.5 - 5.1 mmol/L   Chloride 104 98 - 111 mmol/L   CO2 25 22 - 32 mmol/L   Glucose, Bld 125 (H) 70 - 99 mg/dL   BUN 14 8 - 23 mg/dL   Creatinine, Ser 8.65 0.61 - 1.24 mg/dL   Calcium 8.7 (L) 8.9 - 10.3 mg/dL   Total Protein 6.2 (L) 6.5 - 8.1 g/dL   Albumin 3.4 (L) 3.5 - 5.0 g/dL   AST 20 15 - 41 U/L   ALT 17 0 - 44 U/L   Alkaline Phosphatase 71 38 - 126 U/L   Total Bilirubin 1.5 (H) 0.3 - 1.2 mg/dL   GFR, Estimated >78 >46 mL/min   Anion gap 10 5 - 15  Magnesium     Status: None   Collection Time: 09/10/23  8:36 AM  Result Value Ref Range   Magnesium 2.0 1.7 - 2.4 mg/dL  Phosphorus     Status: None   Collection Time: 09/10/23  8:36 AM  Result Value Ref Range   Phosphorus 2.9 2.5 - 4.6 mg/dL   CT Head Wo Contrast  Result Date: 09/09/2023 CLINICAL DATA:  Head trauma EXAM: CT HEAD WITHOUT CONTRAST TECHNIQUE: Contiguous axial images were obtained from the base of the skull through the vertex without intravenous contrast. RADIATION DOSE REDUCTION: This exam was performed according to the departmental dose-optimization program which includes automated exposure control, adjustment of the mA and/or kV according to patient size and/or use of iterative reconstruction technique. COMPARISON:  09/08/2023 FINDINGS: Brain: Unchanged 3 mm subdural hematoma along the anterior falx. Unchanged 6 mm hemorrhagic contusion in the posterior left frontal lobe (series 2, image 30).  No evidence of acute infarct, new hemorrhage, mass, mass effect, or midline shift. No hydrocephalus or extra-axial collection Vascular: No hyperdense vessel. Atherosclerotic calcifications in the intracranial carotid and vertebral arteries. 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 Skull: Negative for fracture or focal lesion. Parietal scalp hematoma with superficial skin  staples. Sinuses/Orbits: Minimal mucosal thickening in the ethmoid air cells. Status post bilateral lens replacements. Other: The mastoid air cells are well aerated. IMPRESSION: 1. Unchanged 3 mm subdural hematoma along the anterior falx. 2. Unchanged 6 mm hemorrhagic contusion in the posterior left frontal lobe. 3. 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 Electronically Signed   By: Wiliam Ke M.D.   On: 09/09/2023 02:08   CT HEAD WO CONTRAST ( )  Result Date: 09/08/2023 CLINICAL DATA:  Head trauma, moderate-severe; Neck trauma (Age >= 65y) EXAM: CT HEAD WITHOUT CONTRAST CT CERVICAL SPINE WITHOUT CONTRAST TECHNIQUE: Multidetector CT imaging of the head and cervical spine was performed following the standard protocol without intravenous contrast. Multiplanar CT image reconstructions of the cervical spine were also generated. RADIATION DOSE REDUCTION: This exam was performed according to the departmental dose-optimization program which includes automated exposure control, adjustment of the mA and/or kV according to patient size and/or use of iterative reconstruction technique. COMPARISON:  CT head 07/21/22 FINDINGS: CT HEAD FINDINGS Brain: 3 mm subdural hematoma along the anterior falx. 6 mm hemorrhagic contusion in the posterior left frontal lobe. No CT evidence of an acute cortical infarct. No hydrocephalus. No mass effect. Vascular: Dolichoectasia of the intracranial vasculature. Skull: Soft tissue swelling midline posterior scalp. No evidence of underlying calvarial fracture. Sinuses/Orbits: No middle ear or mastoid effusion. Paranasal sinuses are clear. Bilateral lens replacement. Orbits are otherwise unremarkable. Other: None. CT CERVICAL SPINE FINDINGS Alignment: There asymmetric widening of the space between the dens in the left lateral mass of C1 relative to the right lateral mass. Skull base and vertebrae: No acute fracture. No  primary bone lesion or focal pathologic process. Soft tissues and spinal canal: No prevertebral fluid or swelling. No visible canal hematoma. Disc levels: No evidence of high-grade spinal canal stenosis. Upper chest: Negative. Other: None IMPRESSION: 1. 3 mm subdural hematoma along the anterior falx.  No midline shift 2. 6 mm hemorrhagic contusion in the posterior left frontal lobe. 3. Asymmetric widening of the space between the dens and the left lateral mass of C1 relative to the right lateral mass. While this may be positional, is suspicious for worrisome fixation. Recommend further evaluation with MRI of the cervical spine. 4. Soft tissue swelling midline posterior scalp. No evidence of underlying calvarial fracture. Electronically Signed   By: Lorenza Cambridge M.D.   On: 09/08/2023 16:55   CT Cervical Spine Wo Contrast  Result Date: 09/08/2023 CLINICAL DATA:  Head trauma, moderate-severe; Neck trauma (Age >= 65y) EXAM: CT HEAD WITHOUT CONTRAST CT CERVICAL SPINE WITHOUT CONTRAST TECHNIQUE: Multidetector CT imaging of the head and cervical spine was performed following the standard protocol without intravenous contrast. Multiplanar CT image reconstructions of the cervical spine were also generated. RADIATION DOSE REDUCTION: This exam was performed according to the departmental dose-optimization program which includes automated exposure control, adjustment of the mA and/or kV according to patient size and/or use of iterative reconstruction technique. COMPARISON:  CT head 07/21/22 FINDINGS: CT HEAD FINDINGS Brain: 3 mm subdural hematoma along the anterior falx. 6 mm hemorrhagic contusion  in the posterior left frontal lobe. No CT evidence of an acute cortical infarct. No hydrocephalus. No mass effect. Vascular: Dolichoectasia of the intracranial vasculature. Skull: Soft tissue swelling midline posterior scalp. No evidence of underlying calvarial fracture. Sinuses/Orbits: No middle ear or mastoid effusion. Paranasal  sinuses are clear. Bilateral lens replacement. Orbits are otherwise unremarkable. Other: None. CT CERVICAL SPINE FINDINGS Alignment: There asymmetric widening of the space between the dens in the left lateral mass of C1 relative to the right lateral mass. Skull base and vertebrae: No acute fracture. No primary bone lesion or focal pathologic process. Soft tissues and spinal canal: No prevertebral fluid or swelling. No visible canal hematoma. Disc levels: No evidence of high-grade spinal canal stenosis. Upper chest: Negative. Other: None IMPRESSION: 1. 3 mm subdural hematoma along the anterior falx.  No midline shift 2. 6 mm hemorrhagic contusion in the posterior left frontal lobe. 3. Asymmetric widening of the space between the dens and the left lateral mass of C1 relative to the right lateral mass. While this may be positional, is suspicious for worrisome fixation. Recommend further evaluation with MRI of the cervical spine. 4. Soft tissue swelling midline posterior scalp. No evidence of underlying calvarial fracture. Electronically Signed   By: Lorenza Cambridge M.D.   On: 09/08/2023 16:55   DG Pelvis 1-2 Views  Result Date: 09/08/2023 CLINICAL DATA:  Shortness of breath post fall EXAM: PELVIS - 1-2 VIEW COMPARISON:  CT 09/18/2019 FINDINGS: There is no evidence of pelvic fracture or diastasis. No pelvic bone lesions are seen. IMPRESSION: Negative. Electronically Signed   By: Corlis Leak M.D.   On: 09/08/2023 16:15   DG Chest Portable 1 View  Result Date: 09/08/2023 CLINICAL DATA:  Shortness of breath post fall EXAM: PORTABLE CHEST - 1 VIEW COMPARISON:  07/17/2022 FINDINGS: No pneumothorax. Relatively low lung volumes with prominent interstitial markings. No confluent airspace disease. Heart size and mediastinal contours are within normal limits. Aortic Atherosclerosis (ICD10-170.0). No effusion. Visualized bones unremarkable. IMPRESSION: Low lung volumes with prominent interstitial markings. No acute findings.  Electronically Signed   By: Corlis Leak M.D.   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 Does the need for close, 24 hr/day medical supervision in concert with the patient's rehab needs make it unreasonable for this patient to be served in a less intensive setting? Yes Co-Morbidities requiring supervision/potential complications:  -chronic a fib (off eliquis now) -htn -diabetes -BPPV Due to bladder management, bowel management, safety, skin/wound care, disease management, medication administration, pain management, and patient education, does the patient require 24 hr/day rehab nursing? Yes Does the patient require coordinated care of a physician, rehab nurse, therapy disciplines of PT, OT, SLP to address physical and functional deficits in the context of the above medical diagnosis(es)? Yes Addressing deficits in the following areas: balance, endurance, locomotion, strength, transferring, bowel/bladder control, bathing, dressing, feeding, grooming, toileting, cognition, speech, psychosocial support, and vestibular rx Can the patient actively participate in an intensive therapy program of at least 3 hrs of therapy per day at least 5 days per week? Yes The potential for patient to make measurable gains while on inpatient rehab is excellent Anticipated functional outcomes upon discharge from inpatient rehab are modified independent and supervision  with PT, modified independent and supervision with OT, modified independent and supervision with SLP. Estimated rehab length of stay to reach the above functional goals is: 7-10 days Anticipated discharge destination: Home Overall Rehab/Functional Prognosis:  excellent  POST ACUTE RECOMMENDATIONS: This patient's condition is appropriate for continued rehabilitative care in the following setting: CIR Patient has agreed to participate in recommended program. Potentially Note that  insurance prior authorization may be required for reimbursement for recommended care.  Comment: Pt will need supervision when he initially returns home. Need to establish who's going to provide that. It appears that he has family and friends nearby. Rehab Admissions Coordinator to follow up.     MEDICAL RECOMMENDATIONS: Pt somnolent this morning. Received hydrocodone around 5am. Avoid neuro-sedating medication as possible. The patient didn't seem to be in significant pain.    I have personally performed a face to face diagnostic evaluation of this patient. Additionally, I have examined the patient's medical record including any pertinent labs and radiographic images. If the physician assistant has documented in this note, I have reviewed and edited or otherwise concur with the physician assistant's documentation.  Thanks,  Ranelle Oyster, MD 09/10/2023

## 2023-09-10 NOTE — Care Management Obs Status (Signed)
MEDICARE OBSERVATION STATUS NOTIFICATION   Patient Details  Name: Mathew Simpson MRN: [id number] Date of Birth: 06-17-1936   Medicare Observation Status Notification Given:  Yes    Baldemar Lenis, LCSW 09/10/2023, 3:04 PM

## 2023-09-10 NOTE — Progress Notes (Signed)
Initial Nutrition Assessment  DOCUMENTATION CODES:  Non-severe (moderate) malnutrition in context of social or environmental circumstances  INTERVENTION:  Encourage adequate PO intake Ensure Enlive po BID (vanilla), each supplement provides 350 kcal and 20 grams of protein. MVI with minerals daily  NUTRITION DIAGNOSIS:  Moderate Malnutrition related to social / environmental circumstances as evidenced by mild fat depletion, mild muscle depletion.  GOAL:  Patient will meet greater than or equal to 90% of their needs  MONITOR:  PO intake, Supplement acceptance, Labs, Weight trends  REASON FOR ASSESSMENT:  Consult Other (Comment) (nutrition goals)  ASSESSMENT:  Pt admitted after a mechanical fall leading to SDH. PMH significant for afib on eliquis, HTN.   Assessed by Neurosurgery. Continue supportive care.   Spoke with pt at bedside, though he was attempting to sleep. Pt lives alone and states that his appetite has been at his baseline up until admission. He recalls eating "when I feel like it," which is typically 2-3 meals per day. Sometimes these may be regular size meals or they may be smaller snacks. Denies difficulty chewing/swallowing foods. He does not consume protein supplements but reports liking milkshakes.   No documented meal completions on file to review. Pt states that he received pancakes, oatmeal and sausage this morning which he consumed about half.    Pt reports that his weight has been stable around 195 lbs. Denies significant weight changes. Unfortunately, there is limited weight history on file to review. July 2023, pt's weight was documented to be 86.2 kg. Does not appear he has had any significant weight loss based on limited information. Will continue to monitor throughout admission.   Pt mentions that his mobility has been more limited recently however he does not use a cane or walker for assistance.   Medications: SSI 0-9 units q4h  Labs: CBG's 118-165 x24  hours, HgbA1c 6.7%  NUTRITION - FOCUSED PHYSICAL EXAM: Flowsheet Row Most Recent Value  Orbital Region Mild depletion  Upper Arm Region Mild depletion  Thoracic and Lumbar Region No depletion  Buccal Region Moderate depletion  Temple Region Mild depletion  Clavicle Bone Region Mild depletion  Clavicle and Acromion Bone Region No depletion  Scapular Bone Region No depletion  Dorsal Hand No depletion  Patellar Region Mild depletion  Anterior Thigh Region Mild depletion  Posterior Calf Region No depletion  Edema (RD Assessment) None  Hair Reviewed  Eyes Reviewed  Mouth Reviewed  Skin Reviewed  Nails Reviewed       Diet Order:   Diet Order             Diet Carb Modified Fluid consistency: Thin; Room service appropriate? Yes  Diet effective now                  EDUCATION NEEDS:  No education needs have been identified at this time  Skin:  Skin Assessment: Reviewed RN Assessment (posterior head laceration)  Last BM:  9/16  Height:  Ht Readings from Last 1 Encounters:  09/08/23 5\' 10"  (1.778 m)   Weight:  Wt Readings from Last 1 Encounters:  09/08/23 88.5 kg   BMI:  Body mass index is 27.98 kg/m.  Estimated Nutritional Needs:  Kcal:  1900-2100 Protein:  95-110g Fluid:  >/=2L  Drusilla Kanner, RDN, LDN Clinical Nutrition

## 2023-09-10 NOTE — TOC CAGE-AID Note (Signed)
Transition of Care [facility]) - CAGE-AID Screening   Patient Details  Name: Mathew Simpson MRN: [id number] Date of Birth: 11/16/1936  [facility]) CM/SW Contact:    Leota Sauers, RN Phone Number: 09/10/2023, 4:06 PM   Clinical Narrative:  Patient denies the use of alcohol and illicit substances. Resources not given at this time.   CAGE-AID Screening:    Have You Ever Felt You Ought to Cut Down on Your Drinking or Drug Use?: No Have People Annoyed You By Critizing Your Drinking Or Drug Use?: No Have You Felt Bad Or Guilty About Your Drinking Or Drug Use?: No Have You Ever Had a Drink or Used Drugs First Thing In The Morning to Steady Your Nerves or to Get Rid of a Hangover?: No CAGE-AID Score: 0  Substance Abuse Education Offered: No

## 2023-09-10 NOTE — Progress Notes (Signed)
On first assessment, pt reports headache 5/10 and says this is the "worst it's been" since admission. Denies change in character of HA but says has been gradually getting worse. No neuro changes.  Pt also reports he has chronic back pain and requests his norco for that.   MD notified of increased headache and at bedside to assess. Per MD ok to give norco and will order meds for elevated BP. Headache and BP slightly improved and pt went to sleep.   In am BP more elevated and headache increased again. MD notified; norco, labetalol and avepro given per MD. BP and headache improving.

## 2023-09-10 NOTE — NC FL2 (Addendum)
[place] MEDICAID FL2 LEVEL OF CARE FORM     IDENTIFICATION  Patient Name: Mathew Simpson Birthdate: Sep 17, 1936 Sex: male Admission Date (Current Location): 09/08/2023  [facility] and [place] Number:  Producer, television/film/video and Address:  [facility], [address] [place], [place] [address]      Provider Number: [id number]  Attending Physician Name and Address:  Zigmund Daniel., *  Relative Name and Phone Number:       Current Level of Care: Hospital Recommended Level of Care: Skilled Nursing Facility Prior Approval Number:    Date Approved/Denied:   PASRR Number: [id number]  Discharge Plan: SNF    Current Diagnoses: Patient Active Problem List   Diagnosis Date Noted   Bleeding in head following injury with loss of consciousness (HCC) 09/08/2023   Subdural hematoma (HCC) 09/08/2023   DM2 (diabetes mellitus, type 2) (HCC) 09/08/2023   Neck injury 09/08/2023   Malnutrition of moderate degree 07/18/2022   Cellulitis of left foot 07/17/2022   Sepsis due to cellulitis (HCC) 07/17/2022   Chronic atrial fibrillation (HCC) 07/17/2022   Hypokalemia 07/17/2022   Mild protein malnutrition (HCC) 07/17/2022   Hyperbilirubinemia 07/17/2022   Primary osteoarthritis of right knee 03/12/2020   Cellulitis of left lower extremity    Septic joint of left knee joint (HCC) 09/15/2019   Acute blood loss anemia 09/15/2019   Essential hypertension 09/15/2019   OA (osteoarthritis) of knee 09/12/2019    Orientation RESPIRATION BLADDER Height & Weight     Self, Time, Situation, Place  Normal Continent Weight: 195 lb (88.5 kg) Height:  5\' 10"  (177.8 cm)  BEHAVIORAL SYMPTOMS/MOOD NEUROLOGICAL BOWEL NUTRITION STATUS      Continent Diet (Carb Modified)  AMBULATORY STATUS COMMUNICATION OF NEEDS Skin   Limited Assist Verbally Surgical wounds (Head Posterior)                       Personal Care Assistance Level of Assistance  Bathing, Dressing Bathing  Assistance: Limited assistance   Dressing Assistance: Limited assistance     Functional Limitations Info  Hearing   Hearing Info: Impaired      SPECIAL CARE FACTORS FREQUENCY  PT (By licensed PT), OT (By licensed OT)     PT Frequency: 5x/week OT Frequency: 5x/week            Contractures      Additional Factors Info  Code Status, Allergies Code Status Info: Full Allergies Info: Penicillins           Current Medications (09/10/2023):  This is the current hospital active medication list Current Facility-Administered Medications  Medication Dose Route Frequency Provider Last Rate Last Admin   acetaminophen (TYLENOL) tablet 650 mg  650 mg Oral Q6H PRN Doutova, Anastassia, MD   650 mg at 09/09/23 0865   Or   acetaminophen (TYLENOL) suppository 650 mg  650 mg Rectal Q6H PRN Doutova, Anastassia, MD       diltiazem (CARDIZEM CD) 24 hr capsule 180 mg  180 mg Oral Daily Doutova, Anastassia, MD   180 mg at 09/10/23 1316   HYDROcodone-acetaminophen (NORCO/VICODIN) 5-325 MG per tablet 1-2 tablet  1-2 tablet Oral Q4H PRN Therisa Doyne, MD   2 tablet at 09/10/23 1315   insulin aspart (novoLOG) injection 0-9 Units  0-9 Units Subcutaneous Q4H Doutova, Jonny Ruiz, MD   2 Units at 09/10/23 1316   irbesartan (AVAPRO) tablet 300 mg  300 mg Oral Daily Darlin Drop, DO  300 mg at 09/10/23 1316   labetalol (NORMODYNE) injection 5 mg  5 mg Intravenous Q2H PRN Dow Adolph N, DO   5 mg at 09/10/23 0502   ondansetron (ZOFRAN) tablet 4 mg  4 mg Oral Q6H PRN Therisa Doyne, MD       Or   ondansetron (ZOFRAN) injection 4 mg  4 mg Intravenous Q6H PRN Therisa Doyne, MD         Discharge Medications: Please see discharge summary for a list of discharge medications.  Relevant Imaging Results:  Relevant Lab Results:   Additional Information SSN: [id number]  Antion Q Laural Benes, Student-Social Work

## 2023-09-10 NOTE — Progress Notes (Signed)
PROGRESS NOTE    ALA DESAUTEL  ZOX:[id number] DOB: 1936-06-05 DOA: 09/08/2023 PCP: [name], PA-C  Chief Complaint  Patient presents with   Level 2, Fall on Thinners    Brief Narrative:   MALAKY KITCHENS is Donnelle Olmeda 87 y.o. male with medical history significant of Jiayi Lengacher.fib on eliquis, HTN who presented after Dearion Huot mechanical fall.  Family things he may have fallen backwards from top of stairs.  Suspected LOC.  Assessment & Plan:   Principal Problem:   Bleeding in head following injury with loss of consciousness (HCC) Active Problems:   Essential hypertension   Chronic atrial fibrillation (HCC)   Subdural hematoma (HCC)   DM2 (diabetes mellitus, type 2) (HCC)   Neck injury  Subdural Hematoma Mechanical Fall Scalp Lac Head CT with 3 mm subdural along anterior falx, 6 mm hemorrhagic contusion in posterior L frontal lobe, asymmetric widening of the space between the dens and the left lateral mass of C1 relative to the right lateral mass Repeat CT head with unchanged 3 mm subdural hematoma along anterior falx and unchanged 6 mm hemorrhagic contusion in posterior L frontal lobe Appreciate neurology recommendations (hold eliquis for 2 weeks), no need for cervical immobilization, follow with nsgy outpatient PT/OT Staples for scalp lac will need removal outpatient   Atrial Fibrillation Cardizem Continue to hold eliquis at least 2 weeks, would discuss risks/benefits of resumption at his follow up appointment prior to resuming  HTN Cardizem Irbesartan in the AM  T2Dm A1c 6.7 SSI  Intracranial Dolichoectasia Needs outpatient follow up   Awaiting rehab     DVT prophylaxis: SCD -> lovenox, discussed with pharm/nsgy Code Status: full Family Communication: none Disposition:   Status is: Observation The patient remains OBS appropriate and will d/c before 2 midnights.   Consultants:  none  Procedures:  none  Antimicrobials:  Anti-infectives (From admission, onward)    None        Subjective: No complaints  Objective: Vitals:   09/10/23 0729 09/10/23 1142 09/10/23 1514 09/10/23 2041  BP: (!) 145/89 (!) 157/75 115/74 (!) 163/81  Pulse: 74 84 62 (!) 57  Resp: 19 19 19 18   Temp: 98.4 F (36.9 C) 98.4 F (36.9 C) 98.3 F (36.8 C) 98.5 F (36.9 C)  TempSrc: Oral Oral Oral Oral  SpO2: 97% 94% 96% 97%  Weight:      Height:        Intake/Output Summary (Last 24 hours) at 09/10/2023 2048 Last data filed at 09/09/2023 2226 Gross per 24 hour  Intake --  Output 250 ml  Net -250 ml   Filed Weights   09/08/23 1601  Weight: 88.5 kg    Examination:  General: No acute distress. Sitting in chair - eating Cardiovascular: RRR Lungs: unlabored Neurological: Alert and oriented 3. Moves all extremities 4 with equal strength. Cranial nerves II through XII grossly intact Extremities: No clubbing or cyanosis. No edema.    Data Reviewed: I have personally reviewed following labs and imaging studies  CBC: Recent Labs  Lab 09/08/23 1538 09/09/23 0850 09/10/23 0836  WBC 6.0 6.1 6.8  NEUTROABS 4.4  --  4.8  HGB 12.7* 12.0* 12.2*  HCT 38.5* 35.7* 36.7*  MCV 91.4 92.0 90.2  PLT 151 137* 141*    Basic Metabolic Panel: Recent Labs  Lab 09/08/23 1538 09/08/23 2109 09/09/23 0850 09/10/23 0836  NA 138  --  141 139  K 4.0  --  3.6 3.5  CL 102  --  106  104  CO2 27  --  23 25  GLUCOSE 127*  --  96 125*  BUN 19  --  15 14  CREATININE 1.30*  --  1.08 1.05  CALCIUM 8.5*  --  8.5* 8.7*  MG  --  1.9 1.8 2.0  PHOS  --  3.5 3.5 2.9    GFR: Estimated Creatinine Clearance: 57.6 mL/min (by C-G formula based on SCr of 1.05 mg/dL).  Liver Function Tests: Recent Labs  Lab 09/08/23 1538 09/09/23 0850 09/10/23 0836  AST 24 18 20   ALT 19 17 17   ALKPHOS 89 76 71  BILITOT 1.3* 1.4* 1.5*  PROT 6.7 5.9* 6.2*  ALBUMIN 3.7 3.3* 3.4*    CBG: Recent Labs  Lab 09/09/23 2345 09/10/23 0413 09/10/23 0731 09/10/23 1143 09/10/23 1516  GLUCAP 118* 118*  149* 183* 152*     No results found for this or any previous visit (from the past 240 hour(s)).       Radiology Studies: CT Head Wo Contrast  Result Date: 09/09/2023 CLINICAL DATA:  Head trauma EXAM: CT HEAD WITHOUT CONTRAST TECHNIQUE: Contiguous axial images were obtained from the base of the skull through the vertex without intravenous contrast. RADIATION DOSE REDUCTION: This exam was performed according to the departmental dose-optimization program which includes automated exposure control, adjustment of the mA and/or kV according to patient size and/or use of iterative reconstruction technique. COMPARISON:  09/08/2023 FINDINGS: Brain: Unchanged 3 mm subdural hematoma along the anterior falx. Unchanged 6 mm hemorrhagic contusion in the posterior left frontal lobe (series 2, image 30). No evidence of acute infarct, new hemorrhage, mass, mass effect, or midline shift. No hydrocephalus or extra-axial collection Vascular: No hyperdense vessel. Atherosclerotic calcifications in the intracranial carotid and vertebral arteries. 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 Skull: Negative for fracture or focal lesion. Parietal scalp hematoma with superficial skin staples. Sinuses/Orbits: Minimal mucosal thickening in the ethmoid air cells. Status post bilateral lens replacements. Other: The mastoid air cells are well aerated. IMPRESSION: 1. Unchanged 3 mm subdural hematoma along the anterior falx. 2. Unchanged 6 mm hemorrhagic contusion in the posterior left frontal lobe. 3. 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 Electronically Signed   By: Wiliam Ke M.D.   On: 09/09/2023 02:08        Scheduled Meds:  diltiazem  180 mg Oral Daily   enoxaparin (LOVENOX) injection  40 mg Subcutaneous Q24H   feeding supplement  237 mL Oral BID BM   insulin  aspart  0-5 Units Subcutaneous QHS   [START ON 09/11/2023] insulin aspart  0-9 Units Subcutaneous TID WC   irbesartan  300 mg Oral Daily   multivitamin with minerals  1 tablet Oral Daily   polyethylene glycol  17 g Oral BID   Continuous Infusions:   LOS: 0 days    Time spent: over 30 min    Lacretia Nicks, MD [facility]   To contact the attending provider between 7A-7P or the covering provider during after hours 7P-7A, please log into the web site [email or web address] and access using universal [place] password for that web site. If you do not have the password, please call the hospital operator.  09/10/2023, 8:48 PM

## 2023-09-10 NOTE — Progress Notes (Signed)
[facility] rehab admissions - I met with patient at the bedside.  I gave him booklets about rehab here in the hospital.  I then called his grand daughter and spoke at length.  Grand daughter [name] would like SNF placement for patient because there is no one to stay with patient after rehab stay.  Patient and grand daughter do not want the [facility] in [facility] because he was there in 08/23 and had a bad experience there.  I will let case manager and social worker know of patient/family preference for SNF placement.  Call me for questions.  [phone]

## 2023-09-10 NOTE — Progress Notes (Signed)
Physical Therapy Treatment Patient Details Name: Mathew Simpson MRN: [id number] DOB: October 18, 1936 Today's Date: 09/10/2023   History of Present Illness Mathew Simpson is a 87 y.o. male admitted 9/17 to the hospital after presenting to the emergency department after a fall.  CT scan was therefore obtained which demonstrated a small intracranial hemorrhage in the left frontal/parietal region.  PMH for hypertension, glaucoma, hx of B TKR.    PT Comments  Patient only exhibiting R beat nystagmus with supine roll to R and only about 10 seconds, though very symptomatic and unable to visualize eyes with pt keeping them shut during side to sit.  Able to complete hallway ambulation and sit up in recliner this session.  Feel his "vertigo" needs continued workup more with positional changes than supine testing to determine if single or multicanal.  PT will follow, will need inpatient rehab (<3 hours/day) as no help available at d/c.     If plan is discharge home, recommend the following: A little help with walking and/or transfers;A little help with bathing/dressing/bathroom;Assistance with cooking/housework;Direct supervision/assist for medications management;Direct supervision/assist for financial management;Assist for transportation;Help with stairs or ramp for entrance;Supervision due to cognitive status   Can travel by private vehicle     Yes  Equipment Recommendations  None recommended by PT    Recommendations for Other Services       Precautions / Restrictions Precautions Precautions: Fall Precaution Comments: likely BPPV component     Mobility  Bed Mobility Overal bed mobility: Needs Assistance Bed Mobility: Sidelying to Sit   Sidelying to sit: Min assist, Used rails, Mod assist       General bed mobility comments: increased time and A for balance as more dizziness when sitting up though not as noteable with positional testing in supine    Transfers Overall transfer level: Needs  assistance Equipment used: Rolling walker (2 wheels) Transfers: Sit to/from Stand Sit to Stand: Min assist           General transfer comment: up from slightly higher surface and cues for forward gaze on stationary target    Ambulation/Gait Ambulation/Gait assistance: Contact guard assist Gait Distance (Feet): 200 Feet Assistive device: Rolling walker (2 wheels) Gait Pattern/deviations: Step-to pattern, Step-through pattern, Decreased stride length, Trunk flexed, Shuffle       General Gait Details: somewhat flexed though improved with walker adjustment and pt c/o back pain, which is chronic though feels worse with bedrest. cues throughout for eyes focused ahead for balance and to reduce dizziness   Stairs             Wheelchair Mobility     Tilt Bed    Modified Rankin (Stroke Patients Only)       Balance Overall balance assessment: Needs assistance Sitting-balance support: Feet supported Sitting balance-Leahy Scale: Fair     Standing balance support: Bilateral upper extremity supported, Reliant on assistive device for balance Standing balance-Leahy Scale: Poor                              Cognition Arousal: Alert Behavior During Therapy: WFL for tasks assessed/performed Overall Cognitive Status: No family/caregiver present to determine baseline cognitive functioning                                 General Comments: seems mostly intact though not 100% with problem solving, though feels that might  be what got him into this condition in the first place        Exercises      General Comments General comments (skin integrity, edema, etc.): completed dix hallpike R and L bed in trendelenberg then supine roll test with mild short lived R beat nystagmus with R side horizontal testing.  Did not much imcreased symptoms with side to sit though pt not opening eyes to see any nystagmus      Pertinent Vitals/Pain Pain Assessment Faces  Pain Scale: Hurts even more Pain Location: back pain Pain Descriptors / Indicators: Grimacing, Discomfort, Moaning Pain Intervention(s): Monitored during session, Repositioned, RN gave pain meds during session    Home Living                          Prior Function            PT Goals (current goals can now be found in the care plan section) Progress towards PT goals: Progressing toward goals    Frequency    Min 1X/week      PT Plan      Co-evaluation              AM-PAC PT "6 Clicks" Mobility   Outcome Measure  Help needed turning from your back to your side while in a flat bed without using bedrails?: A Little Help needed moving from lying on your back to sitting on the side of a flat bed without using bedrails?: A Little Help needed moving to and from a bed to a chair (including a wheelchair)?: A Little Help needed standing up from a chair using your arms (e.g., wheelchair or bedside chair)?: A Little Help needed to walk in hospital room?: A Little Help needed climbing 3-5 steps with a railing? : Total 6 Click Score: 16    End of Session Equipment Utilized During Treatment: Gait belt Activity Tolerance: Patient tolerated treatment well Patient left: with call bell/phone within reach;in chair   PT Visit Diagnosis: Other abnormalities of gait and mobility (R26.89);History of falling (Z91.81);Muscle weakness (generalized) (M62.81);BPPV;Dizziness and giddiness (R42);Pain     Time: 1610-9604 PT Time Calculation (min) (ACUTE ONLY): 35 min  Charges:    $Gait Training: 8-22 mins $Neuromuscular Re-education: 8-22 mins PT General Charges $$ ACUTE PT VISIT: 1 Visit                     Sheran Lawless, PT Acute Rehabilitation Services [phone] 09/10/2023    Mathew Simpson 09/10/2023, 5:27 PM

## 2023-09-10 NOTE — Progress Notes (Deleted)
[facility] rehab admissions - I met with patient.  He is very hard of hearing.  He gave me permission to call his grand daughter.  I spoke with Thomasa at [phone].  Grand daughter would like to pursue SNF placement because daughter travels out of town all the time and G-daughter cannot accommodate patient in her home.  Patient and grand daughter do not want patient to go to [facility] in [facility] because of a bad experience at that facility.  Patient was there in August of 2023.  I will let case manager/social worker know of need for SNF placement.  Call me for questions.  [phone]

## 2023-09-11 DIAGNOSIS — S065XAA Traumatic subdural hemorrhage with loss of consciousness status unknown, initial encounter: Secondary | ICD-10-CM | POA: Diagnosis not present

## 2023-09-11 LAB — GLUCOSE, CAPILLARY
Glucose-Capillary: 124 mg/dL — ABNORMAL HIGH (ref 70–99)
Glucose-Capillary: 132 mg/dL — ABNORMAL HIGH (ref 70–99)
Glucose-Capillary: 214 mg/dL — ABNORMAL HIGH (ref 70–99)
Glucose-Capillary: 89 mg/dL (ref 70–99)

## 2023-09-11 MED ORDER — BISACODYL 10 MG RE SUPP: 10.0000 mg | Freq: Every day | RECTAL | Status: DC | PRN

## 2023-09-11 NOTE — Progress Notes (Signed)
Occupational Therapy Treatment Patient Details Name: Mathew Simpson MRN: [id number] DOB: 1936/06/29 Today's Date: 09/11/2023   History of present illness Mathew Simpson is a 87 y.o. male admitted 9/17 to the hospital after presenting to the emergency department after a fall.  CT scan was therefore obtained which demonstrated a small intracranial hemorrhage in the left frontal/parietal region.  PMH for hypertension, glaucoma, hx of B TKR.   OT comments  Pt progressing toward established OT goals. Challenging dynamic seated and standing balance this session with reaching outside BOS in seated and standing during ADL and visual scanning to locate items in hall during functional mobility. Pt needing min guard A due to unsteadiness on feet. Noting poor safety with RW use and needing intermittent cueing to keep RW on floor. Will continue to follow. Patient will benefit from continued inpatient follow up therapy, <3 hours/day       If plan is discharge home, recommend the following:  A little help with walking and/or transfers;A little help with bathing/dressing/bathroom;Assist for transportation;Help with stairs or ramp for entrance;Assistance with cooking/housework   Equipment Recommendations  None recommended by OT    Recommendations for Other Services      Precautions / Restrictions Precautions Precautions: Fall Precaution Comments: likely BPPV component Restrictions Weight Bearing Restrictions: No       Mobility Bed Mobility               General bed mobility comments: Up in chair    Transfers Overall transfer level: Needs assistance Equipment used: Rolling walker (2 wheels) Transfers: Sit to/from Stand Sit to Stand: Contact guard assist           General transfer comment: up from recliner x2. CGA for safety     Balance Overall balance assessment: Needs assistance Sitting-balance support: Feet supported Sitting balance-Leahy Scale: Fair     Standing balance  support: Bilateral upper extremity supported, Reliant on assistive device for balance Standing balance-Leahy Scale: Poor                             ADL either performed or assessed with clinical judgement   ADL Overall ADL's : Needs assistance/impaired Eating/Feeding: Independent;Sitting Eating/Feeding Details (indicate cue type and reason): drink from cup; Grooming: Wash/dry hands;Wash/dry face;Standing;Contact guard assist Grooming Details (indicate cue type and reason): CGA for safety             Lower Body Dressing: Set up;Sitting/lateral leans Lower Body Dressing Details (indicate cue type and reason): to doff and don socks with increased time. Toilet Transfer: Contact guard assist;Rolling walker (2 wheels);Ambulation Toilet Transfer Details (indicate cue type and reason): Poor safety with RW in small spaces while navigating to restroom noted         Functional mobility during ADLs: Contact guard assist;Rolling walker (2 wheels) General ADL Comments: Poor safety awareness and intermittent difficulty with problem solving during session    Extremity/Trunk Assessment Upper Extremity Assessment Upper Extremity Assessment: Generalized weakness   Lower Extremity Assessment Lower Extremity Assessment: Defer to PT evaluation        Vision   Vision Assessment?: Yes Tracking/Visual Pursuits: Decreased smoothness of horizontal tracking;Decreased smoothness of vertical tracking;Other (comment) (several times losing fixation on therapist finger during tracking) Visual Fields: No apparent deficits (NT formally but noticing therapists actions on both L and R with therapist switching sides of pt during mobility in hall.) Additional Comments: Glaucoma at baseline per chart. Pt denying dizziness  during session   Perception Perception Perception: Within Functional Limits   Praxis Praxis Praxis: WFL    Cognition Arousal: Alert Behavior During Therapy: WFL for tasks  assessed/performed Overall Cognitive Status: No family/caregiver present to determine baseline cognitive functioning                                 General Comments: Pt following up to 3 step commands with increased time. Decreased and inconsistent problem solving noted as indicated by easily locating room numbers during way finding but unable to locate his room on way back, reporting "well I was looking for a sign on the door, not the wall" despite having found signage easily during 3 step path finding. Pt with poor safety with RW needing intermittent cues to keep RW on floor and within a safe range from his body.        Exercises Exercises: Other exercises Other Exercises Other Exercises: visual scanning, tracking and depth perception task at sink with pt erasing name with finger with letters scattered about mirror. Max cues to fully trace.    Shoulder Instructions       General Comments      Pertinent Vitals/ Pain       Pain Assessment Pain Assessment: Faces Faces Pain Scale: Hurts little more Pain Location: back pain Pain Descriptors / Indicators: Grimacing, Discomfort, Moaning Pain Intervention(s): Monitored during session  Home Living                                          Prior Functioning/Environment              Frequency  Min 1X/week        Progress Toward Goals  OT Goals(current goals can now be found in the care plan section)  Progress towards OT goals: Progressing toward goals  Acute Rehab OT Goals Patient Stated Goal: none stated OT Goal Formulation: With patient Time For Goal Achievement: 09/23/23 Potential to Achieve Goals: Good ADL Goals Pt Will Perform Grooming: with supervision;standing (3 tasks) Pt Will Perform Lower Body Bathing: with supervision;sit to/from stand Pt Will Perform Lower Body Dressing: with supervision;sit to/from stand Pt Will Transfer to Toilet: with supervision;ambulating Pt Will  Perform Toileting - Clothing Manipulation and hygiene: with supervision;sit to/from stand Additional ADL Goal #1: Pt will report dizziness less than 2/10 with positional changes during bed mobility and transfers.  Plan      Co-evaluation                 AM-PAC OT "6 Clicks" Daily Activity     Outcome Measure   Help from another person eating meals?: None Help from another person taking care of personal grooming?: A Little Help from another person toileting, which includes using toliet, bedpan, or urinal?: A Little Help from another person bathing (including washing, rinsing, drying)?: A Little Help from another person to put on and taking off regular upper body clothing?: A Little Help from another person to put on and taking off regular lower body clothing?: A Little 6 Click Score: 19    End of Session Equipment Utilized During Treatment: Gait belt  OT Visit Diagnosis: Unsteadiness on feet (R26.81);Other abnormalities of gait and mobility (R26.89);Repeated falls (R29.6);Pain   Activity Tolerance Patient tolerated treatment well   Patient Left with call bell/phone within  reach;in chair   Nurse Communication Mobility status        Time: [phone] OT Time Calculation (min): 29 min  Charges: OT General Charges $OT Visit: 1 Visit OT Treatments $Self Care/Home Management : 8-22 mins $Therapeutic Activity: 8-22 mins  Myrla Halsted, OTD, OTR/L [facility] Acute Rehabilitation Office: [phone]   Myrla Halsted 09/11/2023, 10:33 AM

## 2023-09-11 NOTE — Plan of Care (Signed)
Problem: Coping: Goal: Ability to adjust to condition or change in health will improve Outcome: Progressing   Problem: Fluid Volume: Goal: Ability to maintain a balanced intake and output will improve Outcome: Progressing   Problem: Health Behavior/Discharge Planning: Goal: Ability to identify and utilize available resources and services will improve Outcome: Progressing Goal: Ability to manage health-related needs will improve Outcome: Progressing   Problem: Metabolic: Goal: Ability to maintain appropriate glucose levels will improve Outcome: Progressing   Problem: Nutritional: Goal: Maintenance of adequate nutrition will improve Outcome: Progressing Goal: Progress toward achieving an optimal weight will improve Outcome: Progressing   Problem: Skin Integrity: Goal: Risk for impaired skin integrity will decrease Outcome: Progressing   Problem: Tissue Perfusion: Goal: Adequacy of tissue perfusion will improve Outcome: Progressing   Problem: Education: Goal: Knowledge of General Education information will improve Description: Including pain rating scale, medication(s)/side effects and non-pharmacologic comfort measures Outcome: Progressing   Problem: Health Behavior/Discharge Planning: Goal: Ability to manage health-related needs will improve Outcome: Progressing   Problem: Clinical Measurements: Goal: Ability to maintain clinical measurements within normal limits will improve Outcome: Progressing Goal: Will remain free from infection Outcome: Progressing Goal: Diagnostic test results will improve Outcome: Progressing Goal: Respiratory complications will improve Outcome: Progressing Goal: Cardiovascular complication will be avoided Outcome: Progressing   Problem: Activity: Goal: Risk for activity intolerance will decrease Outcome: Progressing   Problem: Nutrition: Goal: Adequate nutrition will be maintained Outcome: Progressing   Problem: Coping: Goal:  Level of anxiety will decrease Outcome: Progressing   Problem: Elimination: Goal: Will not experience complications related to bowel motility Outcome: Progressing Goal: Will not experience complications related to urinary retention Outcome: Progressing   Problem: Pain Managment: Goal: General experience of comfort will improve Outcome: Progressing   Problem: Safety: Goal: Ability to remain free from injury will improve Outcome: Progressing   Problem: Skin Integrity: Goal: Risk for impaired skin integrity will decrease Outcome: Progressing

## 2023-09-11 NOTE — Progress Notes (Signed)
PROGRESS NOTE    Mathew Simpson  AOZ:[id number] DOB: 1936/01/22 DOA: 09/08/2023 PCP: [name], PA-C  Chief Complaint  Patient presents with   Level 2, Fall on Thinners    Brief Narrative:   Mathew Simpson is Mathew Simpson 87 y.o. male with medical history significant of Mathew Simpson.fib on eliquis, HTN who presented after Mathew Simpson mechanical fall.  Family things he may have fallen backwards from top of stairs.  Suspected LOC.  Assessment & Plan:   Principal Problem:   Bleeding in head following injury with loss of consciousness (HCC) Active Problems:   Essential hypertension   Chronic atrial fibrillation (HCC)   Subdural hematoma (HCC)   DM2 (diabetes mellitus, type 2) (HCC)   Neck injury   Fall  Subdural Hematoma Mechanical Fall Scalp [place] CT with 3 mm subdural along anterior falx, 6 mm hemorrhagic contusion in posterior L frontal lobe, asymmetric widening of the space between the dens and the left lateral mass of C1 relative to the right lateral mass Repeat CT head with unchanged 3 mm subdural hematoma along anterior falx and unchanged 6 mm hemorrhagic contusion in posterior L frontal lobe Appreciate neurology recommendations (hold eliquis for 2 weeks), no need for cervical immobilization, follow with nsgy outpatient PT/OT Staples for scalp lac will need removal outpatient   Atrial Fibrillation Cardizem Continue to hold eliquis at least 2 weeks, would discuss risks/benefits of resumption at his follow up appointment prior to resuming  HTN Cardizem Irbesartan in the AM  T2Dm A1c 6.7 SSI  Intracranial Dolichoectasia Needs outpatient follow up   Constipation Bowel regimen   Awaiting rehab     DVT prophylaxis: SCD -> lovenox, discussed with pharm/nsgy Code Status: full Family Communication: none Disposition:   Status is: Observation The patient remains OBS appropriate and will d/c before 2 midnights.   Consultants:  none  Procedures:  none  Antimicrobials:   Anti-infectives (From admission, onward)    None       Subjective: No complaints  Objective: Vitals:   09/11/23 0425 09/11/23 0801 09/11/23 1147 09/11/23 1529  BP: (!) 150/75 (!) 155/69 (!) 144/63 134/65  Pulse: 71 70 63 62  Resp: 18 20 16 20   Temp: 97.9 F (36.6 C) 99 F (37.2 C) 99.1 F (37.3 C) 97.6 F (36.4 C)  TempSrc: Oral Oral Oral Oral  SpO2: 93% 92% 97% 96%  Weight:      Height:        Intake/Output Summary (Last 24 hours) at 09/11/2023 1849 Last data filed at 09/11/2023 0900 Gross per 24 hour  Intake 840 ml  Output 850 ml  Net -10 ml   Filed Weights   09/08/23 1601  Weight: 88.5 kg    Examination:  General: No acute distress. Sitting in chair resting  Cardiovascular:RRR Abdomen: Soft, nontender, nondistended  Neurological: Alert and oriented 3. Moves all extremities 4 with equal strength. Cranial nerves II through XII grossly intact. Extremities: No clubbing or cyanosis. No edema.  Data Reviewed: I have personally reviewed following labs and imaging studies  CBC: Recent Labs  Lab 09/08/23 1538 09/09/23 0850 09/10/23 0836  WBC 6.0 6.1 6.8  NEUTROABS 4.4  --  4.8  HGB 12.7* 12.0* 12.2*  HCT 38.5* 35.7* 36.7*  MCV 91.4 92.0 90.2  PLT 151 137* 141*    Basic Metabolic Panel: Recent Labs  Lab 09/08/23 1538 09/08/23 2109 09/09/23 0850 09/10/23 0836  NA 138  --  141 139  K 4.0  --  3.6  3.5  CL 102  --  106 104  CO2 27  --  23 25  GLUCOSE 127*  --  96 125*  BUN 19  --  15 14  CREATININE 1.30*  --  1.08 1.05  CALCIUM 8.5*  --  8.5* 8.7*  MG  --  1.9 1.8 2.0  PHOS  --  3.5 3.5 2.9    GFR: Estimated Creatinine Clearance: 57.6 mL/min (by C-G formula based on SCr of 1.05 mg/dL).  Liver Function Tests: Recent Labs  Lab 09/08/23 1538 09/09/23 0850 09/10/23 0836  AST 24 18 20   ALT 19 17 17   ALKPHOS 89 76 71  BILITOT 1.3* 1.4* 1.5*  PROT 6.7 5.9* 6.2*  ALBUMIN 3.7 3.3* 3.4*    CBG: Recent Labs  Lab 09/10/23 1516  09/10/23 2221 09/11/23 0656 09/11/23 1145 09/11/23 1647  GLUCAP 152* 144* 124* 132* 214*     No results found for this or any previous visit (from the past 240 hour(s)).       Radiology Studies: No results found.      Scheduled Meds:  diltiazem  180 mg Oral Daily   enoxaparin (LOVENOX) injection  40 mg Subcutaneous Q24H   feeding supplement  237 mL Oral BID BM   insulin aspart  0-5 Units Subcutaneous QHS   insulin aspart  0-9 Units Subcutaneous TID WC   irbesartan  300 mg Oral Daily   multivitamin with minerals  1 tablet Oral Daily   polyethylene glycol  17 g Oral BID   Continuous Infusions:   LOS: 0 days    Time spent: over 30 min    Lacretia Nicks, MD [facility]   To contact the attending provider between 7A-7P or the covering provider during after hours 7P-7A, please log into the web site [email or web address] and access using universal [facility] password for that web site. If you do not have the password, please call the hospital operator.  09/11/2023, 6:49 PM

## 2023-09-11 NOTE — TOC Progression Note (Signed)
Transition of Care [facility]) - Progression Note    Patient Details  Name: Mathew Simpson MRN: [id number] Date of Birth: July 18, 1936  Transition of Care [facility]) CM/SW Contact  Baldemar Lenis, [place] Phone Number: 09/11/2023, 1:10 PM  Clinical Narrative:   CSW called [facility] throughout the morning to ask them to review referral. CSW received response back to referral that insurance is out of network. CSW contacted granddaughter, [name], to update that [facility] cannot admit as they are out of network. Thomasa to review options and get back to CSW with next choice for SNF. CSW to follow.    Expected Discharge Plan: Skilled Nursing Facility Barriers to Discharge: Continued Medical Work up, English as a second language teacher  Expected Discharge Plan and Services     Post Acute Care Choice: Skilled Nursing Facility Living arrangements for the past 2 months: Single Family Home                                       Social Determinants of Health (SDOH) Interventions SDOH Screenings   Food Insecurity: No Food Insecurity (09/10/2023)  Housing: Low Risk  (09/10/2023)  Transportation Needs: No Transportation Needs (09/10/2023)  Utilities: Not At Risk (09/10/2023)  Tobacco Use: Medium Risk (09/08/2023)    Readmission Risk Interventions    07/23/2022   11:40 AM 07/21/2022   10:11 AM  Readmission Risk Prevention Plan  Post Dischage Appt Complete Complete  Medication Screening Complete Complete  Transportation Screening Complete Complete

## 2023-09-11 NOTE — Progress Notes (Signed)
Physical Therapy Treatment Patient Details Name: Mathew Simpson MRN: [id number] DOB: [birth year] Today's Date: 09/11/2023   History of Present Illness Mathew Simpson is a 87 y.o. male admitted 9/17 to the hospital after presenting to the emergency department after a fall.  CT scan was therefore obtained which demonstrated a small intracranial hemorrhage in the left frontal/parietal region.  PMH for hypertension, glaucoma, hx of B TKR.    PT Comments  Tolerated well. Reports no dizziness when his head is upright and level. Still notices when rolling over or transitioning to supine<>sit in bed; omitted dix-hallpike testing today at request and will follow-up for more testing/treatment next visit. Pt ambulating with CGA, minor instability with dynamic challenges. Still struggling at times with following directions, but may have a HOH component contributing to this. Tolerated dynamic gait challenges mild staggering with higher level tasks. Patient will continue to benefit from skilled physical therapy services to further improve independence with functional mobility.     If plan is discharge home, recommend the following: A little help with walking and/or transfers;A little help with bathing/dressing/bathroom;Assistance with cooking/housework;Direct supervision/assist for medications management;Direct supervision/assist for financial management;Assist for transportation;Help with stairs or ramp for entrance;Supervision due to cognitive status   Can travel by private vehicle     Yes  Equipment Recommendations  None recommended by PT    Recommendations for Other Services       Precautions / Restrictions Precautions Precautions: Fall Precaution Comments: likely BPPV component Restrictions Weight Bearing Restrictions: No     Mobility  Bed Mobility Overal bed mobility: Needs Assistance Bed Mobility: Sidelying to Sit   Sidelying to sit: Min assist, Used rails, Mod assist Supine to sit: Mod  assist, HOB elevated, Used rails     General bed mobility comments: Up in chair    Transfers Overall transfer level: Needs assistance Equipment used: None Transfers: Sit to/from Stand Sit to Stand: Contact guard assist           General transfer comment: CGA for safety from recliner x2. Initial stand a little unsteady with increased sway but corrected without AD. More stable on second trial.    Ambulation/Gait Ambulation/Gait assistance: Contact guard assist Gait Distance (Feet): 165 Feet Assistive device: None Gait Pattern/deviations: Step-through pattern, Decreased stride length, Shuffle, Wide base of support, Drifts right/left, Staggering left Gait velocity: decr Gait velocity interpretation: <1.8 ft/sec, indicate of risk for recurrent falls   General Gait Details: CGA for safety and intermittent cues for awareness. Shows mild staggering at times but corrects. Educated on awareness and safety with gait. Pt tolerated dynamic challenges with effort including head turns and nods, variable speeds, high march, backwards and lateral stepping.   Stairs             Wheelchair Mobility     Tilt Bed    Modified Rankin (Stroke Patients Only)       Balance Overall balance assessment: Needs assistance Sitting-balance support: Feet supported, No upper extremity supported Sitting balance-Leahy Scale: Good Sitting balance - Comments: Sits edge of chair without support   Standing balance support: No upper extremity supported, During functional activity Standing balance-Leahy Scale: Fair Standing balance comment: Minor instability supervision for safety.                            Cognition Arousal: Alert Behavior During Therapy: WFL for tasks assessed/performed Overall Cognitive Status: No family/caregiver present to determine baseline cognitive functioning  General Comments: Having some difficulty  understanding simple instructions, sometimes requiring instructions to be repeated several times. He has some HOH which may be a component.        Exercises      General Comments General comments (skin integrity, edema, etc.): Omitted as pt feeling well today, requests follow-up for BPPV testing next visit if possible.      Pertinent Vitals/Pain Pain Assessment Pain Assessment: Faces Faces Pain Scale: Hurts a little bit Pain Location: back pain Pain Descriptors / Indicators: Discomfort, Constant, Aching Pain Intervention(s): Repositioned, Monitored during session    Home Living                          Prior Function            PT Goals (current goals can now be found in the care plan section) Acute Rehab PT Goals Patient Stated Goal: Feel better PT Goal Formulation: With patient Time For Goal Achievement: 09/23/23 Potential to Achieve Goals: Good Progress towards PT goals: Progressing toward goals    Frequency    Min 1X/week      PT Plan      Co-evaluation              AM-PAC PT "6 Clicks" Mobility   Outcome Measure  Help needed turning from your back to your side while in a flat bed without using bedrails?: A Little Help needed moving from lying on your back to sitting on the side of a flat bed without using bedrails?: A Little Help needed moving to and from a bed to a chair (including a wheelchair)?: A Little Help needed standing up from a chair using your arms (e.g., wheelchair or bedside chair)?: A Little Help needed to walk in hospital room?: A Little Help needed climbing 3-5 steps with a railing? : Total 6 Click Score: 16    End of Session Equipment Utilized During Treatment: Gait belt Activity Tolerance: Patient tolerated treatment well Patient left: with call bell/phone within reach;in chair;with chair alarm set;with family/visitor present   PT Visit Diagnosis: Other abnormalities of gait and mobility (R26.89);History of falling  (Z91.81);Muscle weakness (generalized) (M62.81);BPPV;Dizziness and giddiness (R42);Pain BPPV - Right/Left :  (suspect Rt posterior) Pain - part of body:  (back)     Time: 1440-1502 PT Time Calculation (min) (ACUTE ONLY): 22 min  Charges:    $Gait Training: 8-22 mins PT General Charges $$ ACUTE PT VISIT: 1 Visit                     Kathlyn Sacramento, PT, DPT [facility]  Rehabilitation Services Physical Therapist Office: [phone] Website: [email or web address]    Berton Mount 09/11/2023, 3:24 PM

## 2023-09-11 NOTE — TOC Progression Note (Signed)
Transition of Care [facility]) - Progression Note    Patient Details  Name: Mathew Simpson MRN: [id number] Date of Birth: 1936/11/27  Transition of Care [facility]) CM/SW Contact  Carley Hammed, LCSW Phone Number: 09/11/2023, 4:28 PM  Clinical Narrative:    CSW spoke with granddaughter to discuss SNF options. Granddaughter and CSW discussed options and they have decided on [facility]. CSW requested CMA's start auth. Facility unsure when a bed will be available. TOC will continue to follow.    Expected Discharge Plan: Skilled Nursing Facility Barriers to Discharge: Continued Medical Work up, English as a second language teacher  Expected Discharge Plan and Services     Post Acute Care Choice: Skilled Nursing Facility Living arrangements for the past 2 months: Single Family Home                                       Social Determinants of Health (SDOH) Interventions SDOH Screenings   Food Insecurity: No Food Insecurity (09/10/2023)  Housing: Low Risk  (09/10/2023)  Transportation Needs: No Transportation Needs (09/10/2023)  Utilities: Not At Risk (09/10/2023)  Tobacco Use: Medium Risk (09/08/2023)    Readmission Risk Interventions    07/23/2022   11:40 AM 07/21/2022   10:11 AM  Readmission Risk Prevention Plan  Post Dischage Appt Complete Complete  Medication Screening Complete Complete  Transportation Screening Complete Complete

## 2023-09-12 DIAGNOSIS — S0101XA Laceration without foreign body of scalp, initial encounter: Secondary | ICD-10-CM | POA: Diagnosis not present

## 2023-09-12 LAB — GLUCOSE, CAPILLARY
Glucose-Capillary: 109 mg/dL — ABNORMAL HIGH (ref 70–99)
Glucose-Capillary: 175 mg/dL — ABNORMAL HIGH (ref 70–99)
Glucose-Capillary: 135 mg/dL — ABNORMAL HIGH (ref 70–99)
Glucose-Capillary: 161 mg/dL — ABNORMAL HIGH (ref 70–99)

## 2023-09-12 MED ORDER — ORAL CARE MOUTH RINSE: 15.0000 mL | OROMUCOSAL | Status: DC | PRN

## 2023-09-12 NOTE — Plan of Care (Signed)
  Problem: Education: Goal: Ability to describe self-care measures that may prevent or decrease complications (Diabetes Survival Skills Education) will improve Outcome: Progressing Goal: Individualized Educational Video(s) Outcome: Progressing   Problem: Coping: Goal: Ability to adjust to condition or change in health will improve Outcome: Progressing   Problem: Fluid Volume: Goal: Ability to maintain a balanced intake and output will improve Outcome: Progressing   Problem: Health Behavior/Discharge Planning: Goal: Ability to identify and utilize available resources and services will improve Outcome: Progressing Goal: Ability to manage health-related needs will improve Outcome: Progressing   Problem: Metabolic: Goal: Ability to maintain appropriate glucose levels will improve Outcome: Progressing   Problem: Skin Integrity: Goal: Risk for impaired skin integrity will decrease Outcome: Progressing   Problem: Tissue Perfusion: Goal: Adequacy of tissue perfusion will improve Outcome: Progressing   Problem: Education: Goal: Knowledge of General Education information will improve Description: Including pain rating scale, medication(s)/side effects and non-pharmacologic comfort measures Outcome: Progressing   Problem: Clinical Measurements: Goal: Ability to maintain clinical measurements within normal limits will improve Outcome: Progressing Goal: Will remain free from infection Outcome: Progressing Goal: Diagnostic test results will improve Outcome: Progressing Goal: Respiratory complications will improve Outcome: Progressing Goal: Cardiovascular complication will be avoided Outcome: Progressing   Problem: Activity: Goal: Risk for activity intolerance will decrease Outcome: Progressing   Problem: Nutrition: Goal: Adequate nutrition will be maintained Outcome: Progressing

## 2023-09-12 NOTE — Progress Notes (Signed)
Physical Therapy Treatment Patient Details Name: Mathew Simpson MRN: [id number] DOB: 1936/01/25 Today's Date: 09/12/2023   History of Present Illness Mathew Simpson is a 87 y.o. male admitted 9/17 to the hospital after presenting to the emergency department after a fall.  CT scan was therefore obtained which demonstrated a small intracranial hemorrhage in the left frontal/parietal region.  PMH for hypertension, glaucoma, hx of B TKR.    PT Comments  Pt tolerates treatment well, reporting continued improvement in symptoms of dizziness. Pt does report reproduction of symptoms with dix-hallpike for R ear. Epley maneuver results in cessation of symptoms during session. Pt electing to utilize RW for this session to allow for improved stability. Pt is able to ambulate for short household distances at this time. Pt continues to endorse a lack of caregiver support, hopeful for short term inpatient PT services to allow for a return to baseline before returning home. PT will continue to follow. If intermittent assistance from family for ADL/IADL management is available pt could likely return home with HHPT.    If plan is discharge home, recommend the following: A little help with walking and/or transfers;A little help with bathing/dressing/bathroom;Assistance with cooking/housework;Direct supervision/assist for medications management;Direct supervision/assist for financial management;Assist for transportation;Help with stairs or ramp for entrance;Supervision due to cognitive status   Can travel by private vehicle     Yes  Equipment Recommendations  None recommended by PT    Recommendations for Other Services       Precautions / Restrictions Precautions Precautions: Fall Precaution Comments: likely BPPV component Restrictions Weight Bearing Restrictions: No     Mobility  Bed Mobility Overal bed mobility: Needs Assistance Bed Mobility: Rolling, Sidelying to Sit Rolling: Supervision Sidelying to  sit: Supervision            Transfers Overall transfer level: Needs assistance Equipment used: Rolling walker (2 wheels) Transfers: Sit to/from Stand Sit to Stand: Contact guard assist                Ambulation/Gait Ambulation/Gait assistance: Contact guard assist Gait Distance (Feet): 250 Feet Assistive device: Rolling walker (2 wheels) Gait Pattern/deviations: Step-through pattern Gait velocity: reduced Gait velocity interpretation: <1.8 ft/sec, indicate of risk for recurrent falls   General Gait Details: pt requests use of RW demonstrating some improvement in safety awareness. Step-through gait with RW, no significant lateral losses of balance. Pt able to turn head left and right to converse with PT without reports of significant symptoms.   Stairs Stairs: Yes Stairs assistance: Contact guard assist Stair Management: Two rails, Step to pattern Number of Stairs: 2 General stair comments: 2 trials of 2 steps   Wheelchair Mobility     Tilt Bed    Modified Rankin (Stroke Patients Only)       Balance Overall balance assessment: Needs assistance Sitting-balance support: No upper extremity supported, Feet supported Sitting balance-Leahy Scale: Good     Standing balance support: Single extremity supported, Reliant on assistive device for balance Standing balance-Leahy Scale: Poor                              Cognition Arousal: Alert Behavior During Therapy: WFL for tasks assessed/performed Overall Cognitive Status: No family/caregiver present to determine baseline cognitive functioning                                 General Comments:  appropriate for session, follows cues for vestibular testing well today        Exercises      General Comments General comments (skin integrity, edema, etc.): VSS on RA. Pt performs dix-hallpike to assess for R posterior canal BPPV, positive for symptoms, unable to assess for nystagmus as pt  maintaining eyes closed. Pt reports improvement in symptoms with epley maneuver, does not report further dizziness during session      Pertinent Vitals/Pain Pain Assessment Pain Assessment: Faces Faces Pain Scale: Hurts little more Pain Location: back pain Pain Descriptors / Indicators: Aching Pain Intervention(s): Monitored during session    Home Living                          Prior Function            PT Goals (current goals can now be found in the care plan section) Acute Rehab PT Goals Patient Stated Goal: Feel better Progress towards PT goals: Progressing toward goals    Frequency    Min 1X/week      PT Plan      Co-evaluation              AM-PAC PT "6 Clicks" Mobility   Outcome Measure  Help needed turning from your back to your side while in a flat bed without using bedrails?: A Little Help needed moving from lying on your back to sitting on the side of a flat bed without using bedrails?: A Little Help needed moving to and from a bed to a chair (including a wheelchair)?: A Little Help needed standing up from a chair using your arms (e.g., wheelchair or bedside chair)?: A Little Help needed to walk in hospital room?: A Little Help needed climbing 3-5 steps with a railing? : A Little 6 Click Score: 18    End of Session Equipment Utilized During Treatment: Gait belt Activity Tolerance: Patient tolerated treatment well Patient left: in chair;with call bell/phone within reach;with chair alarm set Nurse Communication: Mobility status PT Visit Diagnosis: Other abnormalities of gait and mobility (R26.89);History of falling (Z91.81);Muscle weakness (generalized) (M62.81);BPPV;Dizziness and giddiness (R42);Pain BPPV - Right/Left : Right     Time: 1136-1203 PT Time Calculation (min) (ACUTE ONLY): 27 min  Charges:    $Gait Training: 8-22 mins $Canalith Rep Proc: 8-22 mins PT General Charges $$ ACUTE PT VISIT: 1 Visit                      Arlyss Gandy, PT, DPT Acute Rehabilitation Office [phone]    Arlyss Gandy 09/12/2023, 1:10 PM

## 2023-09-12 NOTE — Plan of Care (Signed)

## 2023-09-12 NOTE — Progress Notes (Addendum)
PROGRESS NOTE    [name]  AVW:[id number] DOB: 07-28-1936 DOA: 09/08/2023 PCP: [name], PA-C  Chief Complaint  Patient presents with   Level 2, Fall on Thinners    Brief Narrative:   Mathew Simpson is Mathew Simpson 87 y.o. male with medical history significant of Winson Eichorn.fib on eliquis, HTN who presented after Cortlin Marano mechanical fall.  Family things he may have fallen backwards from top of stairs.  Suspected LOC.  Assessment & Plan:   Principal Problem:   Bleeding in head following injury with loss of consciousness (HCC) Active Problems:   Essential hypertension   Chronic atrial fibrillation (HCC)   Subdural hematoma (HCC)   DM2 (diabetes mellitus, type 2) (HCC)   Neck injury   Fall  Subdural Hematoma Mechanical Fall Scalp Lac Head CT with 3 mm subdural along anterior falx, 6 mm hemorrhagic contusion in posterior L frontal lobe, asymmetric widening of the space between the dens and the left lateral mass of C1 relative to the right lateral mass Repeat CT head with unchanged 3 mm subdural hematoma along anterior falx and unchanged 6 mm hemorrhagic contusion in posterior L frontal lobe Appreciate neurology recommendations (hold eliquis for 2 weeks), no need for cervical immobilization, follow with nsgy outpatient PT/OT Staples for scalp lac will need removal outpatient (after 7-10 days - placed 9/17)  Atrial Fibrillation Cardizem Continue to hold eliquis at least 2 weeks, would discuss risks/benefits of resumption at his follow up appointment prior to resuming  HTN Cardizem Irbesartan in the AM  T2Dm A1c 6.7 SSI  Intracranial Dolichoectasia Needs outpatient follow up   Constipation Bowel regimen   Moderate Malnutrtion Noted Body mass index is 27.98 kg/m.  Awaiting rehab     DVT prophylaxis: SCD -> lovenox, discussed with pharm/nsgy Code Status: full Family Communication: none Disposition:   Status is: Observation The patient remains OBS appropriate and will d/c  before 2 midnights.   Consultants:  none  Procedures:  none  Antimicrobials:  Anti-infectives (From admission, onward)    None       Subjective: No complaints  Objective: Vitals:   09/11/23 2337 09/12/23 0353 09/12/23 0804 09/12/23 1208  BP:  (!) 144/76 (!) 151/84 (!) 156/70  Pulse:  65 70 78  Resp: 17 18 20 14   Temp:  97.6 F (36.4 C) 98.9 F (37.2 C) 98.4 F (36.9 C)  TempSrc:  Oral Oral Oral  SpO2:  92% 100% 94%  Weight:      Height:        Intake/Output Summary (Last 24 hours) at 09/12/2023 1412 Last data filed at 09/12/2023 0900 Gross per 24 hour  Intake 1160 ml  Output 760 ml  Net 400 ml   Filed Weights   09/08/23 1601  Weight: 88.5 kg    Examination:  General: No acute distress. Seen walking the hall and in chair in room Lungs: unlabored Neurological: Alert. Moves all extremities 4. Cranial nerves II through XII grossly intact. Extremities: No clubbing or cyanosis. No edema.   Data Reviewed: I have personally reviewed following labs and imaging studies  CBC: Recent Labs  Lab 09/08/23 1538 09/09/23 0850 09/10/23 0836  WBC 6.0 6.1 6.8  NEUTROABS 4.4  --  4.8  HGB 12.7* 12.0* 12.2*  HCT 38.5* 35.7* 36.7*  MCV 91.4 92.0 90.2  PLT 151 137* 141*    Basic Metabolic Panel: Recent Labs  Lab 09/08/23 1538 09/08/23 2109 09/09/23 0850 09/10/23 0836  NA 138  --  141 139  K 4.0  --  3.6 3.5  CL 102  --  106 104  CO2 27  --  23 25  GLUCOSE 127*  --  96 125*  BUN 19  --  15 14  CREATININE 1.30*  --  1.08 1.05  CALCIUM 8.5*  --  8.5* 8.7*  MG  --  1.9 1.8 2.0  PHOS  --  3.5 3.5 2.9    GFR: Estimated Creatinine Clearance: 57.6 mL/min (by C-G formula based on SCr of 1.05 mg/dL).  Liver Function Tests: Recent Labs  Lab 09/08/23 1538 09/09/23 0850 09/10/23 0836  AST 24 18 20   ALT 19 17 17   ALKPHOS 89 76 71  BILITOT 1.3* 1.4* 1.5*  PROT 6.7 5.9* 6.2*  ALBUMIN 3.7 3.3* 3.4*    CBG: Recent Labs  Lab 09/11/23 1145  09/11/23 1647 09/11/23 2125 09/12/23 0614 09/12/23 1206  GLUCAP 132* 214* 89 109* 175*     No results found for this or any previous visit (from the past 240 hour(s)).       Radiology Studies: No results found.      Scheduled Meds:  diltiazem  180 mg Oral Daily   enoxaparin (LOVENOX) injection  40 mg Subcutaneous Q24H   feeding supplement  237 mL Oral BID BM   insulin aspart  0-5 Units Subcutaneous QHS   insulin aspart  0-9 Units Subcutaneous TID WC   irbesartan  300 mg Oral Daily   multivitamin with minerals  1 tablet Oral Daily   polyethylene glycol  17 g Oral BID   Continuous Infusions:   LOS: 0 days    Time spent: over 30 min    Lacretia Nicks, MD [facility]   To contact the attending provider between 7A-7P or the covering provider during after hours 7P-7A, please log into the web site [email or web address] and access using universal [place] password for that web site. If you do not have the password, please call the hospital operator.  09/12/2023, 2:12 PM

## 2023-09-13 DIAGNOSIS — S065XAA Traumatic subdural hemorrhage with loss of consciousness status unknown, initial encounter: Secondary | ICD-10-CM | POA: Diagnosis not present

## 2023-09-13 LAB — GLUCOSE, CAPILLARY
Glucose-Capillary: 142 mg/dL — ABNORMAL HIGH (ref 70–99)
Glucose-Capillary: 107 mg/dL — ABNORMAL HIGH (ref 70–99)
Glucose-Capillary: 137 mg/dL — ABNORMAL HIGH (ref 70–99)
Glucose-Capillary: 112 mg/dL — ABNORMAL HIGH (ref 70–99)

## 2023-09-13 LAB — BASIC METABOLIC PANEL
Anion gap: 11 (ref 5–15)
BUN: 19 mg/dL (ref 8–23)
CO2: 26 mmol/L (ref 22–32)
Calcium: 8.7 mg/dL — ABNORMAL LOW (ref 8.9–10.3)
Chloride: 98 mmol/L (ref 98–111)
Creatinine, Ser: 1.06 mg/dL (ref 0.61–1.24)
GFR, Estimated: >60 mL/min (ref >60)
Glucose, Bld: 125 mg/dL — ABNORMAL HIGH (ref 70–99)
Potassium: 3.7 mmol/L (ref 3.5–5.1)
Sodium: 135 mmol/L (ref 135–145)

## 2023-09-13 LAB — MAGNESIUM: Magnesium: 2.1 mg/dL (ref 1.7–2.4)

## 2023-09-13 LAB — PHOSPHORUS: Phosphorus: 3.2 mg/dL (ref 2.5–4.6)

## 2023-09-13 MED ORDER — HYDRALAZINE HCL 20 MG/ML IJ SOLN
5.0000 mg | INTRAMUSCULAR | Status: AC
  Administered 2023-09-13: 5 mg via INTRAVENOUS
  Filled 2023-09-13: qty 1

## 2023-09-13 MED ORDER — METOPROLOL TARTRATE 12.5 MG HALF TABLET
12.5000 mg | ORAL_TABLET | Freq: Two times a day (BID) | ORAL | Status: DC
  Administered 2023-09-13 – 2023-10-01 (×38): 12.5 mg via ORAL
  Filled 2023-09-13 (×40): qty 1

## 2023-09-13 NOTE — Progress Notes (Signed)
PROGRESS NOTE    [name]  XBJ:[id number] DOB: [birth year] DOA: 09/08/2023 PCP: [name], PA-C  Chief Complaint  Patient presents with   Level 2, Fall on Thinners    Brief Narrative:   Mathew Simpson is Mathew Simpson 87 y.o. male with medical history significant of [name].fib on eliquis, HTN who presented after Frederic Tones mechanical fall.  Family things he may have fallen backwards from top of stairs.  Suspected LOC.  Assessment & Plan:   Principal Problem:   Bleeding in head following injury with loss of consciousness (HCC) Active Problems:   Essential hypertension   Chronic atrial fibrillation (HCC)   Subdural hematoma (HCC)   DM2 (diabetes mellitus, type 2) (HCC)   Neck injury   Fall   Scalp laceration  Subdural Hematoma Mechanical Fall Scalp Lac Head CT with 3 mm subdural along anterior falx, 6 mm hemorrhagic contusion in posterior L frontal lobe, asymmetric widening of the space between the dens and the left lateral mass of C1 relative to the right lateral mass Repeat CT head with unchanged 3 mm subdural hematoma along anterior falx and unchanged 6 mm hemorrhagic contusion in posterior L frontal lobe Appreciate neurology recommendations (hold eliquis for 2 weeks), no need for cervical immobilization, follow with nsgy outpatient PT/OT Staples for scalp lac will need removal outpatient (after 7-10 days - placed 9/17)  Atrial Fibrillation Cardizem Continue to hold eliquis at least 2 weeks, would discuss risks/benefits of resumption at his follow up appointment prior to resuming  HTN Cardizem Irbesartan in the AM  T2Dm A1c 6.7 SSI  Intracranial Dolichoectasia Needs outpatient follow up   Constipation Bowel regimen   Moderate Malnutrtion Noted Body mass index is 27.98 kg/m.  Awaiting rehab     DVT prophylaxis: SCD -> lovenox, discussed with pharm/nsgy Code Status: full Family Communication: none Disposition:   Status is: Observation The patient remains OBS  appropriate and will d/c before 2 midnights.   Consultants:  none  Procedures:  none  Antimicrobials:  Anti-infectives (From admission, onward)    None       Subjective: No complaints  Objective: Vitals:   09/13/23 0136 09/13/23 0305 09/13/23 0305 09/13/23 0806  BP: (!) 173/83 (!) 159/64 (!) 159/64 (!) 162/70  Pulse: 66 69 83   Resp:    19  Temp:   97.8 F (36.6 C) 98 F (36.7 C)  TempSrc:   Oral Oral  SpO2:   98% 98%  Weight:      Height:        Intake/Output Summary (Last 24 hours) at 09/13/2023 1626 Last data filed at 09/13/2023 1518 Gross per 24 hour  Intake 240 ml  Output 200 ml  Net 40 ml   Filed Weights   09/08/23 1601  Weight: 88.5 kg    Examination:  General: No acute distress. Lungs: unlabored Neurological: Alert . Moves all extremities 4. Cranial nerves II through XII grossly intact. Extremities: No clubbing or cyanosis. No edema.   Data Reviewed: I have personally reviewed following labs and imaging studies  CBC: Recent Labs  Lab 09/08/23 1538 09/09/23 0850 09/10/23 0836  WBC 6.0 6.1 6.8  NEUTROABS 4.4  --  4.8  HGB 12.7* 12.0* 12.2*  HCT 38.5* 35.7* 36.7*  MCV 91.4 92.0 90.2  PLT 151 137* 141*    Basic Metabolic Panel: Recent Labs  Lab 09/08/23 1538 09/08/23 2109 09/09/23 0850 09/10/23 0836 09/13/23 0126  NA 138  --  141 139 135  K 4.0  --  3.6 3.5 3.7  CL 102  --  106 104 98  CO2 27  --  23 25 26   GLUCOSE 127*  --  96 125* 125*  BUN 19  --  15 14 19   CREATININE 1.30*  --  1.08 1.05 1.06  CALCIUM 8.5*  --  8.5* 8.7* 8.7*  MG  --  1.9 1.8 2.0 2.1  PHOS  --  3.5 3.5 2.9 3.2    GFR: Estimated Creatinine Clearance: 57.1 mL/min (by C-G formula based on SCr of 1.06 mg/dL).  Liver Function Tests: Recent Labs  Lab 09/08/23 1538 09/09/23 0850 09/10/23 0836  AST 24 18 20   ALT 19 17 17   ALKPHOS 89 76 71  BILITOT 1.3* 1.4* 1.5*  PROT 6.7 5.9* 6.2*  ALBUMIN 3.7 3.3* 3.4*    CBG: Recent Labs  Lab 09/12/23 1206  09/12/23 1601 09/12/23 2123 09/13/23 0613 09/13/23 1313  GLUCAP 175* 135* 161* 142* 107*     No results found for this or any previous visit (from the past 240 hour(s)).       Radiology Studies: No results found.      Scheduled Meds:  diltiazem  180 mg Oral Daily   enoxaparin (LOVENOX) injection  40 mg Subcutaneous Q24H   feeding supplement  237 mL Oral BID BM   insulin aspart  0-5 Units Subcutaneous QHS   insulin aspart  0-9 Units Subcutaneous TID WC   irbesartan  300 mg Oral Daily   metoprolol tartrate  12.5 mg Oral BID   multivitamin with minerals  1 tablet Oral Daily   polyethylene glycol  17 g Oral BID   Continuous Infusions:   LOS: 0 days    Time spent: over 30 min    Lacretia Nicks, MD [facility]   To contact the attending provider between 7A-7P or the covering provider during after hours 7P-7A, please log into the web site [email or web address] and access using universal [place] password for that web site. If you do not have the password, please call the hospital operator.  09/13/2023, 4:26 PM

## 2023-09-13 NOTE — Plan of Care (Signed)
Problem: Fluid Volume: Goal: Ability to maintain a balanced intake and output will improve Outcome: Progressing   Problem: Metabolic: Goal: Ability to maintain appropriate glucose levels will improve Outcome: Progressing   Problem: Nutritional: Goal: Maintenance of adequate nutrition will improve Outcome: Progressing

## 2023-09-13 NOTE — TOC Progression Note (Signed)
Transition of Care [facility]) - Progression Note    Patient Details  Name: Mathew Simpson MRN: [id number] Date of Birth: 12/14/1936  Transition of Care [facility]) CM/SW Contact  Dellie Burns [place], [place] Phone Number: 09/13/2023, 4:31 PM  Clinical Narrative: [facility], pt's request for SNF auth has been sent to their medical director for review. No peer-to-peer offer at this time. Will provide updates as available.   Dellie Burns, MSW, LCSW [phone] (coverage)        Expected Discharge Plan: Skilled Nursing Facility Barriers to Discharge: Continued Medical Work up, English as a second language teacher  Expected Discharge Plan and Services     Post Acute Care Choice: Skilled Nursing Facility Living arrangements for the past 2 months: Single Family Home                                       Social Determinants of Health (SDOH) Interventions SDOH Screenings   Food Insecurity: No Food Insecurity (09/10/2023)  Housing: Low Risk  (09/10/2023)  Transportation Needs: No Transportation Needs (09/10/2023)  Utilities: Not At Risk (09/10/2023)  Tobacco Use: Medium Risk (09/08/2023)    Readmission Risk Interventions    07/23/2022   11:40 AM 07/21/2022   10:11 AM  Readmission Risk Prevention Plan  Post Dischage Appt Complete Complete  Medication Screening Complete Complete  Transportation Screening Complete Complete

## 2023-09-14 ENCOUNTER — Other Ambulatory Visit: Payer: Self-pay

## 2023-09-14 DIAGNOSIS — S0101XA Laceration without foreign body of scalp, initial encounter: Secondary | ICD-10-CM | POA: Diagnosis not present

## 2023-09-14 DIAGNOSIS — S065XAA Traumatic subdural hemorrhage with loss of consciousness status unknown, initial encounter: Secondary | ICD-10-CM | POA: Diagnosis not present

## 2023-09-14 LAB — GLUCOSE, CAPILLARY
Glucose-Capillary: 115 mg/dL — ABNORMAL HIGH (ref 70–99)
Glucose-Capillary: 151 mg/dL — ABNORMAL HIGH (ref 70–99)
Glucose-Capillary: 114 mg/dL — ABNORMAL HIGH (ref 70–99)
Glucose-Capillary: 173 mg/dL — ABNORMAL HIGH (ref 70–99)

## 2023-09-14 NOTE — Progress Notes (Signed)
Occupational Therapy Treatment Patient Details Name: Mathew Simpson MRN: [id number] DOB: [birth year] Today's Date: 09/14/2023   History of present illness Mathew Simpson is a 87 y.o. male admitted 9/17 to the hospital after presenting to the emergency department after a fall.  CT scan was therefore obtained which demonstrated a small intracranial hemorrhage in the left frontal/parietal region.  PMH for hypertension, glaucoma, hx of B TKR.   OT comments  Patient demonstrating good gains with OT treatment with no complaints of dizziness during mobility or self care tasks. Patient states that he has dizziness with sudden movement. Patient demonstrating gains with self care, transfers, and dressing. Patient will benefit from continued inpatient follow up therapy, <3 hours/day with patient possible to progress to HHOT. Acute OT to continue to follow.       If plan is discharge home, recommend the following:  A little help with walking and/or transfers;A little help with bathing/dressing/bathroom;Assist for transportation;Help with stairs or ramp for entrance;Assistance with cooking/housework   Equipment Recommendations  None recommended by OT    Recommendations for Other Services Rehab consult    Precautions / Restrictions Precautions Precautions: Fall Precaution Comments: likely BPPV component Restrictions Weight Bearing Restrictions: No       Mobility Bed Mobility Overal bed mobility: Needs Assistance             General bed mobility comments: Up in chair    Transfers Overall transfer level: Needs assistance Equipment used: Rolling walker (2 wheels) Transfers: Sit to/from Stand Sit to Stand: Contact guard assist           General transfer comment: CGA for safety with mobility and transfers     Balance Overall balance assessment: Needs assistance Sitting-balance support: No upper extremity supported, Feet supported Sitting balance-Leahy Scale: Good     Standing  balance support: Single extremity supported, Bilateral upper extremity supported, No upper extremity supported Standing balance-Leahy Scale: Poor (progressing to fair)                             ADL either performed or assessed with clinical judgement   ADL Overall ADL's : Needs assistance/impaired     Grooming: Wash/dry hands;Wash/dry face;Oral care;Supervision/safety;Standing Grooming Details (indicate cue type and reason): supervision for safety             Lower Body Dressing: Set up;Sitting/lateral leans Lower Body Dressing Details (indicate cue type and reason): to doff and don socks with increased time. Toilet Transfer: Contact guard assist;Rolling walker (2 wheels);Ambulation Toilet Transfer Details (indicate cue type and reason): simulated           General ADL Comments: no complaints of dizziness while standing at sink    Extremity/Trunk Assessment              Vision       Perception     Praxis      Cognition Arousal: Alert Behavior During Therapy: WFL for tasks assessed/performed Overall Cognitive Status: No family/caregiver present to determine baseline cognitive functioning                                 General Comments: followed directions, oriented        Exercises      Shoulder Instructions       General Comments no complaints of dizziness    Pertinent Vitals/ Pain  Pain Assessment Pain Assessment: Faces Faces Pain Scale: Hurts a little bit Pain Location: back pain Pain Descriptors / Indicators: Aching Pain Intervention(s): Monitored during session, Repositioned  Home Living                                          Prior Functioning/Environment              Frequency  Min 1X/week        Progress Toward Goals  OT Goals(current goals can now be found in the care plan section)  Progress towards OT goals: Progressing toward goals  Acute Rehab OT Goals Patient  Stated Goal: get better OT Goal Formulation: With patient Time For Goal Achievement: 09/23/23 Potential to Achieve Goals: Good ADL Goals Pt Will Perform Grooming: with supervision;standing Pt Will Perform Lower Body Bathing: with supervision;sit to/from stand Pt Will Perform Lower Body Dressing: with supervision;sit to/from stand Pt Will Transfer to Toilet: with supervision;ambulating Pt Will Perform Toileting - Clothing Manipulation and hygiene: with supervision;sit to/from stand Additional ADL Goal #1: Pt will report dizziness less than 2/10 with positional changes during bed mobility and transfers.  Plan      Co-evaluation                 AM-PAC OT "6 Clicks" Daily Activity     Outcome Measure   Help from another person eating meals?: None Help from another person taking care of personal grooming?: A Little Help from another person toileting, which includes using toliet, bedpan, or urinal?: A Little Help from another person bathing (including washing, rinsing, drying)?: A Little Help from another person to put on and taking off regular upper body clothing?: A Little Help from another person to put on and taking off regular lower body clothing?: A Little 6 Click Score: 19    End of Session Equipment Utilized During Treatment: Gait belt;Rolling walker (2 wheels)  OT Visit Diagnosis: Unsteadiness on feet (R26.81);Other abnormalities of gait and mobility (R26.89);Repeated falls (R29.6);Pain   Activity Tolerance Patient tolerated treatment well   Patient Left in chair;with call bell/phone within reach;with chair alarm set   Nurse Communication Mobility status        Time: 0998-3382 OT Time Calculation (min): 25 min  Charges: OT General Charges $OT Visit: 1 Visit OT Treatments $Self Care/Home Management : 8-22 mins $Therapeutic Activity: 8-22 mins  Alfonse Flavors, OTA Acute Rehabilitation Services  Office [phone]   Dewain Penning 09/14/2023, 9:54 AM

## 2023-09-14 NOTE — Plan of Care (Signed)
Problem: Nutritional: Goal: Maintenance of adequate nutrition will improve Outcome: Progressing   Problem: Tissue Perfusion: Goal: Adequacy of tissue perfusion will improve Outcome: Progressing

## 2023-09-14 NOTE — Plan of Care (Signed)
  Problem: Nutritional: Goal: Maintenance of adequate nutrition will improve Outcome: Progressing   Problem: Activity: Goal: Risk for activity intolerance will decrease Outcome: Progressing   Problem: Nutrition: Goal: Adequate nutrition will be maintained Outcome: Progressing   Problem: Elimination: Goal: Will not experience complications related to bowel motility Outcome: Progressing   Problem: Pain Managment: Goal: General experience of comfort will improve Outcome: Progressing

## 2023-09-14 NOTE — Progress Notes (Addendum)
PROGRESS NOTE    Mathew Simpson  ZOX:[id number] DOB: 1936/09/29 DOA: 09/08/2023 PCP: [name], PA-C  Chief Complaint  Patient presents with   Level 2, Fall on Thinners    Brief Narrative:   Mathew Simpson is Mathew Simpson 87 y.o. male with medical history significant of Mathew Simpson.fib on eliquis, HTN who presented after Kyrstyn Greear mechanical fall.  Family things he may have fallen backwards from top of stairs.  Suspected LOC.  Assessment & Plan:   Principal Problem:   Bleeding in head following injury with loss of consciousness (HCC) Active Problems:   Essential hypertension   Chronic atrial fibrillation (HCC)   Subdural hematoma (HCC)   DM2 (diabetes mellitus, type 2) (HCC)   Neck injury   Fall   Scalp laceration  Subdural Hematoma Mechanical Fall Scalp Lac Head CT with 3 mm subdural along anterior falx, 6 mm hemorrhagic contusion in posterior L frontal lobe, asymmetric widening of the space between the dens and the left lateral mass of C1 relative to the right lateral mass Repeat CT head with unchanged 3 mm subdural hematoma along anterior falx and unchanged 6 mm hemorrhagic contusion in posterior L frontal lobe Appreciate neurology recommendations (hold eliquis for 2 weeks), no need for cervical immobilization, follow with nsgy outpatient PT/OT Staples for scalp lac will need removal outpatient (after 7-10 days - placed 9/17)  Atrial Fibrillation Cardizem Continue to hold eliquis at least 2 weeks, would discuss risks/benefits of resumption at his follow up appointment prior to resuming  Vertigo Noted, being worked on with therapy  HTN Cardizem Irbesartan in the AM  T2Dm A1c 6.7 SSI  Intracranial Dolichoectasia Needs outpatient follow up   Constipation Bowel regimen   Moderate Malnutrtion Noted Body mass index is 27.98 kg/m.  Peer to peer denied    DVT prophylaxis: SCD -> lovenox, discussed with pharm/nsgy Code Status: full Family Communication: none Disposition:    Status is: Observation The patient remains OBS appropriate and will d/c before 2 midnights.   Consultants:  none  Procedures:  none  Antimicrobials:  Anti-infectives (From admission, onward)    None       Subjective: No complaints  Objective: Vitals:   09/14/23 0339 09/14/23 0821 09/14/23 1201 09/14/23 1800  BP: (!) 153/80 (!) 151/74 (!) 144/73 (!) 155/78  Pulse: (!) 55 81 (!) 45 75  Resp: 18  17 19   Temp: 97.7 F (36.5 C) 97.6 F (36.4 C) 97.9 F (36.6 C) 97.8 F (36.6 C)  TempSrc: Axillary Oral Oral Oral  SpO2: 100% 96% 97% 97%  Weight:      Height:       No intake or output data in the 24 hours ending 09/14/23 1825  Filed Weights   09/08/23 1601  Weight: 88.5 kg    Examination:  General: No acute distress. Cardiovascular: RRR Lungs: unlabored Neurological: Alert and oriented 3. Moves all extremities 4. Cranial nerves II through XII grossly intact. Extremities: No clubbing or cyanosis. No edema.   Data Reviewed: I have personally reviewed following labs and imaging studies  CBC: Recent Labs  Lab 09/08/23 1538 09/09/23 0850 09/10/23 0836  WBC 6.0 6.1 6.8  NEUTROABS 4.4  --  4.8  HGB 12.7* 12.0* 12.2*  HCT 38.5* 35.7* 36.7*  MCV 91.4 92.0 90.2  PLT 151 137* 141*    Basic Metabolic Panel: Recent Labs  Lab 09/08/23 1538 09/08/23 2109 09/09/23 0850 09/10/23 0836 09/13/23 0126  NA 138  --  141 139 135  K  4.0  --  3.6 3.5 3.7  CL 102  --  106 104 98  CO2 27  --  23 25 26   GLUCOSE 127*  --  96 125* 125*  BUN 19  --  15 14 19   CREATININE 1.30*  --  1.08 1.05 1.06  CALCIUM 8.5*  --  8.5* 8.7* 8.7*  MG  --  1.9 1.8 2.0 2.1  PHOS  --  3.5 3.5 2.9 3.2    GFR: Estimated Creatinine Clearance: 57.1 mL/min (by C-G formula based on SCr of 1.06 mg/dL).  Liver Function Tests: Recent Labs  Lab 09/08/23 1538 09/09/23 0850 09/10/23 0836  AST 24 18 20   ALT 19 17 17   ALKPHOS 89 76 71  BILITOT 1.3* 1.4* 1.5*  PROT 6.7 5.9* 6.2*   ALBUMIN 3.7 3.3* 3.4*    CBG: Recent Labs  Lab 09/13/23 1657 09/13/23 2119 09/14/23 0613 09/14/23 1203 09/14/23 1807  GLUCAP 137* 112* 115* 151* 114*     No results found for this or any previous visit (from the past 240 hour(s)).       Radiology Studies: No results found.      Scheduled Meds:  diltiazem  180 mg Oral Daily   enoxaparin (LOVENOX) injection  40 mg Subcutaneous Q24H   feeding supplement  237 mL Oral BID BM   insulin aspart  0-5 Units Subcutaneous QHS   insulin aspart  0-9 Units Subcutaneous TID WC   irbesartan  300 mg Oral Daily   metoprolol tartrate  12.5 mg Oral BID   multivitamin with minerals  1 tablet Oral Daily   polyethylene glycol  17 g Oral BID   Continuous Infusions:   LOS: 0 days    Time spent: over 30 min    Lacretia Nicks, MD [facility]   To contact the attending provider between 7A-7P or the covering provider during after hours 7P-7A, please log into the web site [email or web address] and access using universal [place] password for that web site. If you do not have the password, please call the hospital operator.  09/14/2023, 6:25 PM

## 2023-09-14 NOTE — Progress Notes (Signed)
Received a call from bedside regarding the patient having recurrent 2.3-second pauses.  Twelve-lead EKG ordered to further assess.  Will keep on telemetry.  Cardiology, Dr. Elayne Guerin, consulted routinely.    We will continue to closely monitor and treat as indicated.

## 2023-09-14 NOTE — TOC Progression Note (Signed)
Transition of Care [facility]) - Progression Note    Patient Details  Name: Mathew Simpson MRN: [id number] Date of Birth: [birth year]  [facility]) CM/SW Contact  Baldemar Lenis, [place] Phone Number: 09/14/2023, 3:54 PM  Clinical Narrative:   CSW received update from [facility] that patient's insurance authorization request went to peer to peer review. CSW sent information to MD. Awaiting results of peer to peer discussion. CSW to follow.  UPDATE: CSW contacted by MD that peer to peer was completed and denied. CSW contacted granddaughter, [name], to provide update and discuss options. Family disagree with denial, would like to appeal the decision. CSW awaiting information from insurance on filing the appeal, will update when received.    Expected Discharge Plan: Skilled Nursing Facility Barriers to Discharge: Continued Medical Work up, English as a second language teacher  Expected Discharge Plan and Services     Post Acute Care Choice: Skilled Nursing Facility Living arrangements for the past 2 months: Single Family Home                                       Social Determinants of Health (SDOH) Interventions SDOH Screenings   Food Insecurity: No Food Insecurity (09/10/2023)  Housing: Low Risk  (09/10/2023)  Transportation Needs: No Transportation Needs (09/10/2023)  Utilities: Not At Risk (09/10/2023)  Tobacco Use: Medium Risk (09/08/2023)    Readmission Risk Interventions    07/23/2022   11:40 AM 07/21/2022   10:11 AM  Readmission Risk Prevention Plan  Post Dischage Appt Complete Complete  Medication Screening Complete Complete  Transportation Screening Complete Complete

## 2023-09-15 ENCOUNTER — Observation Stay (HOSPITAL_BASED_OUTPATIENT_CLINIC_OR_DEPARTMENT_OTHER): Payer: Medicare HMO

## 2023-09-15 DIAGNOSIS — S065XAA Traumatic subdural hemorrhage with loss of consciousness status unknown, initial encounter: Secondary | ICD-10-CM | POA: Diagnosis not present

## 2023-09-15 DIAGNOSIS — R9431 Abnormal electrocardiogram [ECG] [EKG]: Secondary | ICD-10-CM | POA: Diagnosis not present

## 2023-09-15 DIAGNOSIS — I4819 Other persistent atrial fibrillation: Secondary | ICD-10-CM | POA: Diagnosis not present

## 2023-09-15 LAB — ECHOCARDIOGRAM COMPLETE
AR max vel: 3.88 cm2
AV Peak grad: 8.7 mmHg
Ao pk vel: 1.48 m/s
Area-P 1/2: 5.6 cm2
Height: 70 in
MV M vel: 4.47 m/s
MV Peak grad: 79.9 mmHg
P 1/2 time: 377 msec
S' Lateral: 2.7 cm
Weight: 3120 oz

## 2023-09-15 LAB — GLUCOSE, CAPILLARY
Glucose-Capillary: 177 mg/dL — ABNORMAL HIGH (ref 70–99)
Glucose-Capillary: 94 mg/dL (ref 70–99)
Glucose-Capillary: 219 mg/dL — ABNORMAL HIGH (ref 70–99)
Glucose-Capillary: 168 mg/dL — ABNORMAL HIGH (ref 70–99)

## 2023-09-15 LAB — BASIC METABOLIC PANEL
Anion gap: 11 (ref 5–15)
BUN: 25 mg/dL — ABNORMAL HIGH (ref 8–23)
CO2: 23 mmol/L (ref 22–32)
Calcium: 8.8 mg/dL — ABNORMAL LOW (ref 8.9–10.3)
Chloride: 102 mmol/L (ref 98–111)
Creatinine, Ser: 1.21 mg/dL (ref 0.61–1.24)
GFR, Estimated: 59 mL/min — ABNORMAL LOW (ref >60)
Glucose, Bld: 196 mg/dL — ABNORMAL HIGH (ref 70–99)
Potassium: 4 mmol/L (ref 3.5–5.1)
Sodium: 136 mmol/L (ref 135–145)

## 2023-09-15 LAB — T4, FREE: Free T4: 1.07 ng/dL (ref 0.61–1.12)

## 2023-09-15 LAB — MAGNESIUM: Magnesium: 2.1 mg/dL (ref 1.7–2.4)

## 2023-09-15 MED ORDER — DILTIAZEM HCL ER COATED BEADS 120 MG PO CP24
120.0000 mg | ORAL_CAPSULE | Freq: Every day | ORAL | Status: DC
  Administered 2023-09-16 – 2023-10-01 (×16): 120 mg via ORAL
  Filled 2023-09-15 (×16): qty 1

## 2023-09-15 NOTE — TOC Progression Note (Signed)
Transition of Care [facility]) - Progression Note    Patient Details  Name: Mathew Simpson MRN: [id number] Date of Birth: [birth year]  Transition of Care [facility]) CM/SW Contact  Baldemar Lenis, [place] Phone Number: 09/15/2023, 3:55 PM  Clinical Narrative:   CSW received information from [facility] on patient's denial. CSW spoke with Maisie Fus and sent her the denial information as well as instructions on filing the appeal. CSW answered questions. [name] will update CSW once the appeal has been requested so that CSW can send in medical records to support the decision.   CSW coordinated with PT assigned on completing session and note to send in with appeal information. Awaiting family to initiate appeal to send in documentation. CSW updated [employer] on status of insurance authorization.    Expected Discharge Plan: Skilled Nursing Facility Barriers to Discharge: Continued Medical Work up, English as a second language teacher  Expected Discharge Plan and Services     Post Acute Care Choice: Skilled Nursing Facility Living arrangements for the past 2 months: Single Family Home                                       Social Determinants of Health (SDOH) Interventions SDOH Screenings   Food Insecurity: No Food Insecurity (09/10/2023)  Housing: Low Risk  (09/10/2023)  Transportation Needs: No Transportation Needs (09/10/2023)  Utilities: Not At Risk (09/10/2023)  Tobacco Use: Medium Risk (09/08/2023)    Readmission Risk Interventions    07/23/2022   11:40 AM 07/21/2022   10:11 AM  Readmission Risk Prevention Plan  Post Dischage Appt Complete Complete  Medication Screening Complete Complete  Transportation Screening Complete Complete

## 2023-09-15 NOTE — Progress Notes (Signed)
Physical Therapy Treatment Patient Details Name: Mathew Simpson MRN: [id number] DOB: 02-08-1936 Today's Date: 09/15/2023   History of Present Illness Mathew Simpson is a 87 y.o. male admitted 9/17 to the hospital after presenting to the emergency department after a fall.  CT scan was therefore obtained which demonstrated a small intracranial hemorrhage in the left frontal/parietal region. +dizziness PMH for hypertension, glaucoma, hx of B TKR.    PT Comments  Patient initially appeared negative for Rt posterior canal BPPV via Dix-Hallpike. He however had vertigo symptoms as progressing OOB. Completed gait training with RW vs no device with contact-guard assist with limited tolerance due to back pain. On return to room, re-assessed for Rt BPPV with rt sidelying test with pt positive for symptoms. Completed Rt Epley with pt + for BPPV symptoms with all 4 positions (strongly positive with 3rd position). After treatment, assisted bed to chair with pt requiring min assist for balance due to posterior bias. Patient's BPPV has been refractory to cannalith repositioning and warrants horizontal canal assessment on next visit. Patient continues with periods of unsteadiness requiring min assist to maintain balance and prevent falling backwards.    If plan is discharge home, recommend the following: A little help with walking and/or transfers;A little help with bathing/dressing/bathroom;Assistance with cooking/housework;Direct supervision/assist for medications management;Direct supervision/assist for financial management;Assist for transportation;Help with stairs or ramp for entrance;Supervision due to cognitive status   Can travel by private vehicle     Yes  Equipment Recommendations  None recommended by PT    Recommendations for Other Services       Precautions / Restrictions Precautions Precautions: Fall Precaution Comments: likely BPPV component Restrictions Weight Bearing Restrictions: No      Mobility  Bed Mobility Overal bed mobility: Needs Assistance Bed Mobility: Rolling, Sidelying to Sit, Sit to Sidelying Rolling: Min assist (as part of Epley) Sidelying to sit: Min assist (as part of Epley) Supine to sit: Contact guard, HOB elevated   Sit to sidelying: Min assist General bed mobility comments: most mobility in conjunction with rt sidelying test and epley maneuver    Transfers Overall transfer level: Needs assistance Equipment used: Rolling walker (2 wheels), None Transfers: Sit to/from Stand, Bed to chair/wheelchair/BSC Sit to Stand: Min assist   Step pivot transfers: Min assist       General transfer comment: min assist with slight posterior bias    Ambulation/Gait Ambulation/Gait assistance: Contact guard assist Gait Distance (Feet): 80 Feet (standing rest; 160 ft) Assistive device: Rolling walker (2 wheels), None Gait Pattern/deviations: Step-through pattern, Wide base of support, Antalgic Gait velocity: reduced Gait velocity interpretation: 1.31 - 2.62 ft/sec, indicative of limited community ambulator   General Gait Details: initially with RW; progressed to no device as pt did not use one PTA; able to tolerate head turns, 180 degree turns, around obstacles without imbalance but with significantly wide base of support   Stairs   Stairs assistance: Contact guard assist Stair Management: One rail Left, Alternating pattern, Forwards Number of Stairs: 5 General stair comments: 5 x 3 reps   Wheelchair Mobility     Tilt Bed    Modified Rankin (Stroke Patients Only)       Balance Overall balance assessment: Needs assistance Sitting-balance support: No upper extremity supported, Feet supported Sitting balance-Leahy Scale: Good Sitting balance - Comments: Sits edge of chair without support   Standing balance support: Single extremity supported, Bilateral upper extremity supported, No upper extremity supported Standing balance-Leahy Scale:  Poor Standing balance comment:  Minor instability supervision for safety.                            Cognition Arousal: Alert Behavior During Therapy: WFL for tasks assessed/performed Overall Cognitive Status: No family/caregiver present to determine baseline cognitive functioning                                 General Comments: followed directions, oriented        Exercises      General Comments General comments (skin integrity, edema, etc.): initial dix-hallpike to rt with a split second of dizziness with no nystagmus noted; pt with +symptoms with supine to sit; rt sidelying test with more pronounced symptoms but still no nystagmus; Rt Epley with symptoms at all 4 positions (3rd the worst)--no nystagmus noted in any position; transfer to chair after Epley required min assist for balance      Pertinent Vitals/Pain Pain Assessment Pain Assessment: 0-10 Pain Score: 5  Breathing: normal Negative Vocalization: none Pain Location: back pain, headache Pain Descriptors / Indicators: Aching Pain Intervention(s): Limited activity within patient's tolerance, Monitored during session, Patient requesting pain meds-RN notified    Home Living                          Prior Function            PT Goals (current goals can now be found in the care plan section) Acute Rehab PT Goals Patient Stated Goal: Feel better Time For Goal Achievement: 09/23/23 Potential to Achieve Goals: Good Progress towards PT goals: Progressing toward goals    Frequency    Min 1X/week      PT Plan      Co-evaluation              AM-PAC PT "6 Clicks" Mobility   Outcome Measure  Help needed turning from your back to your side while in a flat bed without using bedrails?: A Little Help needed moving from lying on your back to sitting on the side of a flat bed without using bedrails?: A Little Help needed moving to and from a bed to a chair (including a  wheelchair)?: A Little Help needed standing up from a chair using your arms (e.g., wheelchair or bedside chair)?: A Little Help needed to walk in hospital room?: A Little Help needed climbing 3-5 steps with a railing? : A Little 6 Click Score: 18    End of Session Equipment Utilized During Treatment: Gait belt Activity Tolerance: Patient limited by pain (back pain when walking and during BPPV treatment) Patient left: in chair;with call bell/phone within reach;with chair alarm set Nurse Communication: Mobility status PT Visit Diagnosis: Other abnormalities of gait and mobility (R26.89);History of falling (Z91.81);Muscle weakness (generalized) (M62.81);BPPV;Dizziness and giddiness (R42);Pain BPPV - Right/Left : Right Pain - part of body:  (back)     Time: 5621-3086 PT Time Calculation (min) (ACUTE ONLY): 29 min  Charges:    $Gait Training: 8-22 mins $Canalith Rep Proc: 8-22 mins PT General Charges $$ ACUTE PT VISIT: 1 Visit                      Jerolyn Center, PT Acute Rehabilitation Services  Office [phone]    Zena Amos 09/15/2023, 9:59 AM

## 2023-09-15 NOTE — Consult Note (Addendum)
Cardiology Consultation   Patient ID: Mathew Simpson MRN: [id number]; DOB: [birth year]  Admit date: 09/08/2023 Date of Consult: 09/15/2023  PCP:  Ronnald Collum   [facility] Providers Cardiologist:  None        Patient Profile:   Mathew Simpson is a 87 y.o. male with a hx of A-fib on Eliquis, HTN, T2DM, vertigo who is being seen 09/15/2023 for the evaluation of pauses at the request of Dow Adolph.  History of Present Illness:   Mathew Simpson is admitted after a fall complicated by subdural hematoma and scalp laceration. He has a history of atrial fibrillation dating back to at least 2020 with all ECG's in our system showing AF with controlled ventricular rates on diltiazem. Since admission 09/14/23 his rates have been between 45 and 81 bpm. Overnight Cardiology was consulted due to a 2.3 second pause that was reportedly asymptomatic. He has no Echocardiogram in our system or ambulatory rhythm monitor. Anticoagulation is currently being held due to subdural hematoma with plan for continued hold for 2 weeks.   Per chart review the etiology of his fall does not appear to be clear. His family reported they believe he may have fallen down the stairs but it is not clear whether it was a mechanical fall, vertigo related, or a syncopal episode.  In speaking with Mathew Simpson he reports tripping over a step which caused his fall. He denies preceding presyncope or vertigo attack. He reports no significant history of presyncope or any episodes of syncope. He is aware of his atrial fibrillation diagnosis but reports having no rhythm awareness. He denies any dyspnea, orthopnea, PND, or LE edema.   Past Medical History:  Diagnosis Date   Arthritis    back,   DJD (degenerative joint disease)    Dysrhythmia    A-fib   Glaucoma    Hypertension     Past Surgical History:  Procedure Laterality Date   EYE SURGERY     bil    JOINT REPLACEMENT     knee surgery     arthroscopic   TOTAL  KNEE ARTHROPLASTY Left 09/12/2019   Procedure: TOTAL KNEE ARTHROPLASTY;  Surgeon: Ollen Gross, MD;  Location: [facility];  Service: Orthopedics;  Laterality: Left;    TOTAL KNEE ARTHROPLASTY Right 03/12/2020   Procedure: TOTAL KNEE ARTHROPLASTY;  Surgeon: Ollen Gross, MD;  Location: [facility];  Service: Orthopedics;  Laterality: Right;      Home Medications:  Prior to Admission medications   Medication Sig Start Date End Date Taking? Authorizing Provider  apixaban (ELIQUIS) 5 MG TABS tablet Take 5 mg by mouth 2 (two) times daily.   Yes [provider]  Calcium Citrate-Vitamin D (CALCIUM + D PO) Take 1 tablet by mouth 2 (two) times daily.   Yes [provider]  Cholecalciferol (DIALYVITE VITAMIN D 5000) 125 MCG (5000 UT) capsule Take 5,000 Units by mouth every evening.    Yes [provider]  diltiazem (CARDIZEM CD) 180 MG 24 hr capsule Take 180 mg by mouth daily.   Yes [provider]  HYDROcodone-acetaminophen (NORCO) 10-325 MG tablet Take 1 tablet by mouth 4 (four) times daily. 07/23/22  Yes Gherghe, Daylene Katayama, MD  irbesartan (AVAPRO) 150 MG tablet Take 150 mg by mouth daily. 07/14/22  Yes [provider]  ketorolac (ACULAR) 0.5 % ophthalmic solution Place 1 drop into the left eye 4 (four) times daily. 07/23/22  Yes Leatha Gilding, MD  Multiple Vitamin (MULTIVITAMIN WITH MINERALS) TABS tablet Take 1 tablet by mouth daily at 2 PM.    Yes [provider]  Multiple Vitamins-Minerals (PRESERVISION AREDS 2 PO) Take 1 tablet by mouth in the morning and at bedtime.   Yes [provider]  TIADYLT ER 180 MG 24 hr capsule Take 180 mg by mouth daily. 05/05/22  Yes [provider]  ciprofloxacin (CILOXAN) 0.3 % ophthalmic solution Place 1 drop into the left eye every 4 (four) hours while awake. Administer 1 drop, every 2 hours, while awake, for 2 days. Then 1 drop, every 4 hours, while awake, for the next 5 days. Patient not  taking: Reported on 09/08/2023 07/23/22   Leatha Gilding, MD    Inpatient Medications: Scheduled Meds:  diltiazem  180 mg Oral Daily   enoxaparin (LOVENOX) injection  40 mg Subcutaneous Q24H   feeding supplement  237 mL Oral BID BM   insulin aspart  0-5 Units Subcutaneous QHS   insulin aspart  0-9 Units Subcutaneous TID WC   irbesartan  300 mg Oral Daily   metoprolol tartrate  12.5 mg Oral BID   multivitamin with minerals  1 tablet Oral Daily   polyethylene glycol  17 g Oral BID   Continuous Infusions:  PRN Meds: acetaminophen **OR** acetaminophen, bisacodyl, HYDROcodone-acetaminophen, labetalol, ondansetron **OR** ondansetron (ZOFRAN) IV, mouth rinse  Allergies:    Allergies  Allergen Reactions   Penicillins Anaphylaxis    Did it involve swelling of the face/tongue/throat, SOB, or low BP? Yes Did it involve sudden or severe rash/hives, skin peeling, or any reaction on the inside of your mouth or nose? Unknown Did you need to seek medical attention at a hospital or doctor's office? Yes When did it last happen?      50+ years If all above answers are "NO", may proceed with cephalosporin use.     Social History:   Social History   Socioeconomic History   Marital status: Single    Spouse name: Not on file   Number of children: Not on file   Years of education: Not on file   Highest education level: Not on file  Occupational History   Not on file  Tobacco Use   Smoking status: Former    Current packs/day: 2.00    Average packs/day: 2.0 packs/day for 10.0 years (20.0 ttl pk-yrs)    Types: Cigarettes   Smokeless tobacco: Never   Tobacco comments:    quit in 20's  Vaping Use   Vaping status: Never Used  Substance and Sexual Activity   Alcohol use: Not Currently   Drug use: Not Currently   Sexual activity: Not Currently  Other Topics Concern   Not on file  Social History Narrative   Not on file   Social Determinants of Health   Financial Resource Strain: Not on  file  Food Insecurity: No Food Insecurity (09/10/2023)   Hunger Vital Sign    Worried About Running Out of Food in the Last Year: Never true    Ran Out of Food in the Last Year: Never true  Transportation Needs: No Transportation Needs (09/10/2023)   PRAPARE - Administrator, Civil Service (Medical): No    Lack of Transportation (Non-Medical): No  Physical Activity: Not on file  Stress: Not on file  Social Connections: Not on file  Intimate Partner Violence: Not At Risk (09/14/2023)   Humiliation, Afraid, Rape, and Kick questionnaire    Fear of Current or Ex-Partner:  No    Emotionally Abused: No    Physically Abused: No    Sexually Abused: No    Family History:    Family History  Problem Relation Age of Onset   Hypertension Other      ROS:  Please see the history of present illness.   All other ROS reviewed and negative.     Physical Exam/Data:   Vitals:   09/14/23 1800 09/14/23 2127 09/14/23 2328 09/15/23 0455  BP: (!) 155/78 (!) 142/73 (!) 152/78 (!) 149/93  Pulse: 75 (!) 57 73 68  Resp: 19 19 18 18   Temp: 97.8 F (36.6 C) (!) 97.4 F (36.3 C) 98 F (36.7 C) (!) 97.5 F (36.4 C)  TempSrc: Oral Oral Oral Oral  SpO2: 97% 93% 98% 94%  Weight:      Height:        Intake/Output Summary (Last 24 hours) at 09/15/2023 0549 Last data filed at 09/15/2023 0400 Gross per 24 hour  Intake --  Output 125 ml  Net -125 ml      09/08/2023    4:01 PM 07/17/2022   11:55 AM 03/12/2020   10:22 AM  Last 3 Weights  Weight (lbs) 195 lb 190 lb 186 lb 15.2 oz  Weight (kg) 88.451 kg 86.183 kg 84.8 kg     Body mass index is 27.98 kg/m.  General:  elderly male, laying in bed, no acute distress HEENT: normal Neck: no JVD Vascular: No carotid bruits; Distal pulses 2+ bilaterally Cardiac: Irregularly irregular, normal rate, holosystolic murmur at the apex Lungs:  clear to auscultation bilaterally, no wheezing, rhonchi or rales  Abd: soft, nontender, no hepatomegaly  Ext:  no edema, hyperpigmentation of the bilateral lower extremities Musculoskeletal:  No deformities, BUE and BLE strength normal and equal Skin: warm and dry  Neuro:  CNs 2-12 intact, no focal abnormalities noted Psych:  Normal affect   EKG:  The EKG was personally reviewed and demonstrates: Atrial fibrillation with normal rate Telemetry:  Telemetry was personally reviewed and demonstrates: Atrial fibrillation with controlled ventricular rates, short pause with longest episode 2.3 seconds, on episode of NSVT  Relevant CV Studies: None  Laboratory Data:  High Sensitivity Troponin:  No results for input(s): "TROPONINIHS" in the last 720 hours.   Chemistry Recent Labs  Lab 09/09/23 0850 09/10/23 0836 09/13/23 0126  NA 141 139 135  K 3.6 3.5 3.7  CL 106 104 98  CO2 23 25 26   GLUCOSE 96 125* 125*  BUN 15 14 19   CREATININE 1.08 1.05 1.06  CALCIUM 8.5* 8.7* 8.7*  MG 1.8 2.0 2.1  GFRNONAA >60 >60 >60  ANIONGAP 12 10 11     Recent Labs  Lab 09/08/23 1538 09/09/23 0850 09/10/23 0836  PROT 6.7 5.9* 6.2*  ALBUMIN 3.7 3.3* 3.4*  AST 24 18 20   ALT 19 17 17   ALKPHOS 89 76 71  BILITOT 1.3* 1.4* 1.5*   Lipids No results for input(s): "CHOL", "TRIG", "HDL", "LABVLDL", "LDLCALC", "CHOLHDL" in the last 168 hours.  Hematology Recent Labs  Lab 09/08/23 1538 09/09/23 0850 09/10/23 0836  WBC 6.0 6.1 6.8  RBC 4.21* 3.88* 4.07*  HGB 12.7* 12.0* 12.2*  HCT 38.5* 35.7* 36.7*  MCV 91.4 92.0 90.2  MCH 30.2 30.9 30.0  MCHC 33.0 33.6 33.2  RDW 13.9 13.8 13.6  PLT 151 137* 141*   Thyroid  Recent Labs  Lab 09/08/23 2109  TSH 0.209*    BNPNo results for input(s): "BNP", "PROBNP" in the last  168 hours.  DDimer No results for input(s): "DDIMER" in the last 168 hours.   Radiology/Studies:  No results found.   Assessment and Plan:   Longstanding persistent atrial fibrillation with controlled ventricular rates - Short pauses that are asymptomatic in the setting of diltiazem use and  overnight likely consistent with vagally mediated AV node delay. Recommend decreasing diltiazem to 120 mg daily and dosing in the morning given highest risk for pauses is in the evening during sleep.  Given contraindication to anticoagulation, the patient is not a candidate for rhythm control at this time.  Based on ECG review it appears he has been in atrial fibrillation since 2020 and successful rhythm control would likely be challenging.  He does not currently have an indication for permanent pacemaker. Agree with Echocardiogram. Hypertension - continue ARB   Risk Assessment/Risk Scores:          CHA2DS2-VASc Score =   4  This indicates a 4  % annual risk of stroke. The patient's score is based upon:  Hypertension - 1 Age - 2 Diabetes - 1     For questions or updates, please contact [facility] Please consult [email or web address] for contact info under    Signed, Roderic Palau, MD  09/15/2023 5:49 AM

## 2023-09-15 NOTE — Progress Notes (Signed)
PT notified this nurse that patient was requesting pain medication. When this nurse brought medication in room, patient found to be asleep in recliner. This nurse left room without administering medication. Will give upon patient request. Chair alarm on, call bell and belongings within reach.

## 2023-09-15 NOTE — Progress Notes (Signed)
Echocardiogram 2D Echocardiogram has been performed.  Mathew Simpson 09/15/2023, 4:26 PM

## 2023-09-15 NOTE — Progress Notes (Addendum)
PROGRESS NOTE    Mathew Simpson  ZOX:[id number] DOB: 1936/10/16 DOA: 09/08/2023 PCP: [name], PA-C  Chief Complaint  Patient presents with   Level 2, Fall on Thinners    Brief Narrative:   Mathew Simpson is Mathew Simpson 87 y.o. male with medical history significant of Mathew Simpson.fib on eliquis, HTN who presented after Mathew Simpson mechanical fall.  Family things he may have fallen backwards from top of stairs.  Suspected LOC.  Found to have subdural hematoma.  Seen by neurosurgery.  Now stable for discharge.  Peer to peer for SNF denied, family is appealing.  Assessment & Plan:   Principal Problem:   Bleeding in head following injury with loss of consciousness (HCC) Active Problems:   Essential hypertension   Chronic atrial fibrillation (HCC)   Subdural hematoma (HCC)   DM2 (diabetes mellitus, type 2) (HCC)   Neck injury   Fall   Scalp laceration  Subdural Hematoma Traumatic Brain Injury Mechanical Fall Scalp Lac Head CT with 3 mm subdural along anterior falx, 6 mm hemorrhagic contusion in posterior L frontal lobe, asymmetric widening of the space between the dens and the left lateral mass of C1 relative to the right lateral mass Repeat CT head with unchanged 3 mm subdural hematoma along anterior falx and unchanged 6 mm hemorrhagic contusion in posterior L frontal lobe Appreciate neurosurgery recommendations (hold eliquis for 2 weeks), no need for cervical immobilization, follow with nsgy outpatient PT/OT Staples for scalp lac will need removal outpatient (after 7-10 days - placed 9/17)  Atrial Fibrillation  Bradycardia  Pauses Normal free T4 Cardizem dose reduced by cardiology, appreciate recs Continue to hold eliquis at least 2 weeks, would discuss risks/benefits of resumption at his follow up appointment prior to resuming  Vertigo Noted, being worked on with therapy  HTN Cardizem Irbesartan in the AM  T2Dm A1c 6.7 SSI  Intracranial Dolichoectasia Needs outpatient follow up    Constipation Bowel regimen   Moderate Malnutrtion Noted Body mass index is 27.98 kg/m.  Peer to peer denied    DVT prophylaxis: SCD -> lovenox, discussed with pharm/nsgy Code Status: full Family Communication: none Disposition:   Status is: Observation The patient remains OBS appropriate and will d/c before 2 midnights.   Consultants:  Neurosurgery cardiology  Procedures:  Echo IMPRESSIONS     1. Left ventricular ejection fraction, by estimation, is 65 to 70%. The  left ventricle has normal function. The left ventricle has no regional  wall motion abnormalities. There is mild concentric left ventricular  hypertrophy. Left ventricular diastolic  parameters are indeterminate.   2. Right ventricular systolic function is normal. The right ventricular  size is severely enlarged. Tricuspid regurgitation signal is inadequate  for assessing PA pressure.   3. Left atrial size was mildly dilated.   4. Right atrial size was mildly dilated.   5. The mitral valve was not well visualized. No evidence of mitral valve  regurgitation. No evidence of mitral stenosis.   6. Eccentric aortic regurgitation see in 5 chamber view. The aortic valve  is tricuspid. Aortic valve regurgitation is mild. No aortic stenosis is  present.   7. Aortic dilatation noted. There is mild dilatation of the aortic root,  measuring 43 mm.   Comparison(s): No prior Echocardiogram.   Antimicrobials:  Anti-infectives (From admission, onward)    None       Subjective: Asking for his food  Objective: Vitals:   09/14/23 2328 09/15/23 0455 09/15/23 0806 09/15/23 1227  BP: Marland Kitchen)  152/78 (!) 149/93 (!) 153/77 (!) 188/66  Pulse: 73 68 71 69  Resp: 18 18  18   Temp: 98 F (36.7 C) (!) 97.5 F (36.4 C) 97.6 F (36.4 C) 97.8 F (36.6 C)  TempSrc: Oral Oral Oral Oral  SpO2: 98% 94% 97% 95%  Weight:      Height:        Intake/Output Summary (Last 24 hours) at 09/15/2023 1811 Last data filed at  09/15/2023 0400 Gross per 24 hour  Intake --  Output 125 ml  Net -125 ml    Filed Weights   09/08/23 1601  Weight: 88.5 kg    Examination:  General: No acute distress. Lungs: unlabored Neurological: Alert and oriented 3. Moves all extremities 4 with equal strength. Cranial nerves II through XII grossly intact. Scalp lac with staples and dried blood Extremities: No clubbing or cyanosis. No edema.   Data Reviewed: I have personally reviewed following labs and imaging studies  CBC: Recent Labs  Lab 09/09/23 0850 09/10/23 0836  WBC 6.1 6.8  NEUTROABS  --  4.8  HGB 12.0* 12.2*  HCT 35.7* 36.7*  MCV 92.0 90.2  PLT 137* 141*    Basic Metabolic Panel: Recent Labs  Lab 09/08/23 2109 09/09/23 0850 09/10/23 0836 09/13/23 0126 09/15/23 0559  NA  --  141 139 135 136  K  --  3.6 3.5 3.7 4.0  CL  --  106 104 98 102  CO2  --  23 25 26 23   GLUCOSE  --  96 125* 125* 196*  BUN  --  15 14 19  25*  CREATININE  --  1.08 1.05 1.06 1.21  CALCIUM  --  8.5* 8.7* 8.7* 8.8*  MG 1.9 1.8 2.0 2.1 2.1  PHOS 3.5 3.5 2.9 3.2  --     GFR: Estimated Creatinine Clearance: 50 mL/min (by C-G formula based on SCr of 1.21 mg/dL).  Liver Function Tests: Recent Labs  Lab 09/09/23 0850 09/10/23 0836  AST 18 20  ALT 17 17  ALKPHOS 76 71  BILITOT 1.4* 1.5*  PROT 5.9* 6.2*  ALBUMIN 3.3* 3.4*    CBG: Recent Labs  Lab 09/14/23 1807 09/14/23 2122 09/15/23 0657 09/15/23 1225 09/15/23 1657  GLUCAP 114* 173* 177* 94 219*     No results found for this or any previous visit (from the past 240 hour(s)).       Radiology Studies: ECHOCARDIOGRAM COMPLETE  Result Date: 09/15/2023    ECHOCARDIOGRAM REPORT   Patient Name:   Mathew Simpson Date of Exam: 09/15/2023 Medical Rec #:  [id number]     Height:       70.0 in Accession #:    [id number]    Weight:       195.0 lb Date of Birth:  [birth year]      BSA:          2.065 m Patient Age:    87 years      BP:           153/77 mmHg Patient Gender:  M             HR:           81 bpm. Exam Location:  Inpatient Procedure: 2D Echo, Cardiac Doppler and Color Doppler Indications:    Abnormal ECG R94.31  History:        Patient has no prior history of Echocardiogram examinations.  Arrythmias:Atrial Fibrillation; Risk Factors:Hypertension and                 Diabetes.  Sonographer:    Lucendia Herrlich Referring Phys: [id number] CAROLE N HALL  Sonographer Comments: Image acquisition challenging due to patient body habitus. IMPRESSIONS  1. Left ventricular ejection fraction, by estimation, is 65 to 70%. The left ventricle has normal function. The left ventricle has no regional wall motion abnormalities. There is mild concentric left ventricular hypertrophy. Left ventricular diastolic parameters are indeterminate.  2. Right ventricular systolic function is normal. The right ventricular size is severely enlarged. Tricuspid regurgitation signal is inadequate for assessing PA pressure.  3. Left atrial size was mildly dilated.  4. Right atrial size was mildly dilated.  5. The mitral valve was not well visualized. No evidence of mitral valve regurgitation. No evidence of mitral stenosis.  6. Eccentric aortic regurgitation see in 5 chamber view. The aortic valve is tricuspid. Aortic valve regurgitation is mild. No aortic stenosis is present.  7. Aortic dilatation noted. There is mild dilatation of the aortic root, measuring 43 mm. Comparison(s): No prior Echocardiogram. FINDINGS  Left Ventricle: Left ventricular ejection fraction, by estimation, is 65 to 70%. The left ventricle has normal function. The left ventricle has no regional wall motion abnormalities. The left ventricular internal cavity size was normal in size. There is  mild concentric left ventricular hypertrophy. Left ventricular diastolic parameters are indeterminate. Right Ventricle: The right ventricular size is severely enlarged. No increase in right ventricular wall thickness. Right ventricular  systolic function is normal. Tricuspid regurgitation signal is inadequate for assessing PA pressure. Left Atrium: Left atrial size was mildly dilated. Right Atrium: Right atrial size was mildly dilated. Pericardium: There is no evidence of pericardial effusion. Mitral Valve: The mitral valve was not well visualized. No evidence of mitral valve regurgitation. No evidence of mitral valve stenosis. Tricuspid Valve: The tricuspid valve is normal in structure. Tricuspid valve regurgitation is not demonstrated. No evidence of tricuspid stenosis. Aortic Valve: Eccentric aortic regurgitation see in 5 chamber view. The aortic valve is tricuspid. Aortic valve regurgitation is mild. Aortic regurgitation PHT measures 377 msec. No aortic stenosis is present. Aortic valve peak gradient measures 8.7 mmHg. Pulmonic Valve: The pulmonic valve was not well visualized. Pulmonic valve regurgitation is not visualized. No evidence of pulmonic stenosis. Aorta: Aortic dilatation noted. There is mild dilatation of the aortic root, measuring 43 mm. IAS/Shunts: The atrial septum is grossly normal.  LEFT VENTRICLE PLAX 2D LVIDd:         4.40 cm   Diastology LVIDs:         2.70 cm   LV e' medial:    11.80 cm/s LV PW:         1.20 cm   LV E/e' medial:  7.6 LV IVS:        1.20 cm   LV e' lateral:   15.20 cm/s LVOT diam:     2.40 cm   LV E/e' lateral: 5.9 LV SV:         81 LV SV Index:   39 LVOT Area:     4.52 cm  RIGHT VENTRICLE             IVC RV S prime:     20.80 cm/s  IVC diam: 2.50 cm TAPSE (M-mode): 1.7 cm LEFT ATRIUM             Index        RIGHT ATRIUM  Index LA diam:        3.80 cm 1.84 cm/m   RA Area:     25.67 cm LA Vol (A2C):   94.9 ml 45.96 ml/m  RA Volume:   77.13 ml  37.35 ml/m LA Vol (A4C):   49.4 ml 23.92 ml/m LA Biplane Vol: 69.6 ml 33.70 ml/m  AORTIC VALVE AV Area (Vmax): 3.88 cm AV Vmax:        147.50 cm/s AV Peak Grad:   8.7 mmHg LVOT Vmax:      126.60 cm/s LVOT Vmean:     74.960 cm/s LVOT VTI:       0.179 m  AI PHT:         377 msec  AORTA Ao Root diam: 4.30 cm Ao Asc diam:  4.00 cm MITRAL VALVE               TRICUSPID VALVE MV Area (PHT): 5.60 cm    TR Peak grad:   22.1 mmHg MV Decel Time: 136 msec    TR Vmax:        235.00 cm/s MR Peak grad: 79.9 mmHg MR Vmax:      447.00 cm/s  SHUNTS MV E velocity: 89.65 cm/s  Systemic VTI:  0.18 m MV Kable Haywood velocity: 55.60 cm/s  Systemic Diam: 2.40 cm MV E/Isaack Preble ratio:  1.61 Riley Lam MD Electronically signed by Riley Lam MD Signature Date/Time: 09/15/2023/4:51:28 PM    Final         Scheduled Meds:  Melene Muller ON 09/16/2023] diltiazem  120 mg Oral Daily   enoxaparin (LOVENOX) injection  40 mg Subcutaneous Q24H   feeding supplement  237 mL Oral BID BM   insulin aspart  0-5 Units Subcutaneous QHS   insulin aspart  0-9 Units Subcutaneous TID WC   irbesartan  300 mg Oral Daily   metoprolol tartrate  12.5 mg Oral BID   multivitamin with minerals  1 tablet Oral Daily   polyethylene glycol  17 g Oral BID   Continuous Infusions:   LOS: 0 days    Time spent: over 30 min    Lacretia Nicks, MD [facility]   To contact the attending provider between 7A-7P or the covering provider during after hours 7P-7A, please log into the web site [email or web address] and access using universal Glenn password for that web site. If you do not have the password, please call the hospital operator.  09/15/2023, 6:11 PM

## 2023-09-15 NOTE — Progress Notes (Signed)
Agree with plan for Dr Elayne Guerin.  Had short pauses on telemetry last night, none since, agree with reducing diltiazem dose.  Continue to monitor on telemetry.  Will f/u echocardiogram.

## 2023-09-16 ENCOUNTER — Observation Stay (HOSPITAL_COMMUNITY): Payer: Medicare HMO

## 2023-09-16 DIAGNOSIS — R001 Bradycardia, unspecified: Secondary | ICD-10-CM

## 2023-09-16 DIAGNOSIS — I482 Chronic atrial fibrillation, unspecified: Secondary | ICD-10-CM | POA: Diagnosis not present

## 2023-09-16 DIAGNOSIS — S065XAA Traumatic subdural hemorrhage with loss of consciousness status unknown, initial encounter: Secondary | ICD-10-CM | POA: Diagnosis not present

## 2023-09-16 DIAGNOSIS — E440 Moderate protein-calorie malnutrition: Secondary | ICD-10-CM | POA: Diagnosis present

## 2023-09-16 LAB — GLUCOSE, CAPILLARY
Glucose-Capillary: 152 mg/dL — ABNORMAL HIGH (ref 70–99)
Glucose-Capillary: 134 mg/dL — ABNORMAL HIGH (ref 70–99)
Glucose-Capillary: 163 mg/dL — ABNORMAL HIGH (ref 70–99)
Glucose-Capillary: 125 mg/dL — ABNORMAL HIGH (ref 70–99)

## 2023-09-16 MED ORDER — MELATONIN 3 MG PO TABS
3.0000 mg | ORAL_TABLET | Freq: Every day | ORAL | Status: DC
  Administered 2023-09-16 – 2023-09-17 (×2): 3 mg via ORAL
  Filled 2023-09-16 (×2): qty 1

## 2023-09-16 NOTE — Progress Notes (Signed)
Returned from CT. Sleeping, bed alarms on.  Lidia Collum, RN

## 2023-09-16 NOTE — Progress Notes (Signed)
Rounding Note    Patient Name: Mathew Simpson Date of Encounter: 09/16/2023  [facility] Cardiologist: None   Subjective   Somnolent but arousable this morning, oriented to person only  Inpatient Medications    Scheduled Meds:  diltiazem  120 mg Oral Daily   enoxaparin (LOVENOX) injection  40 mg Subcutaneous Q24H   feeding supplement  237 mL Oral BID BM   insulin aspart  0-5 Units Subcutaneous QHS   insulin aspart  0-9 Units Subcutaneous TID WC   irbesartan  300 mg Oral Daily   metoprolol tartrate  12.5 mg Oral BID   multivitamin with minerals  1 tablet Oral Daily   polyethylene glycol  17 g Oral BID   Continuous Infusions:  PRN Meds: acetaminophen **OR** acetaminophen, bisacodyl, HYDROcodone-acetaminophen, labetalol, ondansetron **OR** ondansetron (ZOFRAN) IV, mouth rinse   Vital Signs    Vitals:   09/16/23 0122 09/16/23 0223 09/16/23 0330 09/16/23 0732  BP: (!) 182/87 (!) 167/86 (!) 164/106 (!) 172/99  Pulse: 88  80 99  Resp:   18 19  Temp:    98.9 F (37.2 C)  TempSrc:    Oral  SpO2:   94% 97%  Weight:      Height:        Intake/Output Summary (Last 24 hours) at 09/16/2023 1037 Last data filed at 09/16/2023 7829 Gross per 24 hour  Intake --  Output 1400 ml  Net -1400 ml      09/08/2023    4:01 PM 07/17/2022   11:55 AM 03/12/2020   10:22 AM  Last 3 Weights  Weight (lbs) 195 lb 190 lb 186 lb 15.2 oz  Weight (kg) 88.451 kg 86.183 kg 84.8 kg      Telemetry    Afib, rate controlled, pause x 2 seconds - Personally Reviewed  ECG    No new ecg - Personally Reviewed  Physical Exam   GEN: Somnolent but arousable Neck: No JVD Cardiac: Irregular, normal rate, 2-6 systolic murmur Respiratory: Clear to auscultation bilaterally. GI: Soft, nontender, non-distended  MS: No edema; No deformity. Neuro: Oriented to person only Psych: Unable to assess  Labs    High Sensitivity Troponin:  No results for input(s): "TROPONINIHS" in the last 720  hours.   Chemistry Recent Labs  Lab 09/10/23 0836 09/13/23 0126 09/15/23 0559  NA 139 135 136  K 3.5 3.7 4.0  CL 104 98 102  CO2 25 26 23   GLUCOSE 125* 125* 196*  BUN 14 19 25*  CREATININE 1.05 1.06 1.21  CALCIUM 8.7* 8.7* 8.8*  MG 2.0 2.1 2.1  PROT 6.2*  --   --   ALBUMIN 3.4*  --   --   AST 20  --   --   ALT 17  --   --   ALKPHOS 71  --   --   BILITOT 1.5*  --   --   GFRNONAA >60 >60 59*  ANIONGAP 10 11 11     Lipids No results for input(s): "CHOL", "TRIG", "HDL", "LABVLDL", "LDLCALC", "CHOLHDL" in the last 168 hours.  Hematology Recent Labs  Lab 09/10/23 0836  WBC 6.8  RBC 4.07*  HGB 12.2*  HCT 36.7*  MCV 90.2  MCH 30.0  MCHC 33.2  RDW 13.6  PLT 141*   Thyroid  Recent Labs  Lab 09/15/23 0559  FREET4 1.07    BNPNo results for input(s): "BNP", "PROBNP" in the last 168 hours.  DDimer No results for input(s): "DDIMER" in the  last 168 hours.   Radiology    ECHOCARDIOGRAM COMPLETE  Result Date: 09/15/2023    ECHOCARDIOGRAM REPORT   Patient Name:   Mathew Simpson Date of Exam: 09/15/2023 Medical Rec #:  [id number]     Height:       70.0 in Accession #:    [id number]    Weight:       195.0 lb Date of Birth:  1936-02-14      BSA:          2.065 m Patient Age:    87 years      BP:           153/77 mmHg Patient Gender: M             HR:           81 bpm. Exam Location:  Inpatient Procedure: 2D Echo, Cardiac Doppler and Color Doppler Indications:    Abnormal ECG R94.31  History:        Patient has no prior history of Echocardiogram examinations.                 Arrythmias:Atrial Fibrillation; Risk Factors:Hypertension and                 Diabetes.  Sonographer:    Lucendia Herrlich Referring Phys: [id number] CAROLE N HALL  Sonographer Comments: Image acquisition challenging due to patient body habitus. IMPRESSIONS  1. Left ventricular ejection fraction, by estimation, is 65 to 70%. The left ventricle has normal function. The left ventricle has no regional wall motion abnormalities.  There is mild concentric left ventricular hypertrophy. Left ventricular diastolic parameters are indeterminate.  2. Right ventricular systolic function is normal. The right ventricular size is severely enlarged. Tricuspid regurgitation signal is inadequate for assessing PA pressure.  3. Left atrial size was mildly dilated.  4. Right atrial size was mildly dilated.  5. The mitral valve was not well visualized. No evidence of mitral valve regurgitation. No evidence of mitral stenosis.  6. Eccentric aortic regurgitation see in 5 chamber view. The aortic valve is tricuspid. Aortic valve regurgitation is mild. No aortic stenosis is present.  7. Aortic dilatation noted. There is mild dilatation of the aortic root, measuring 43 mm. Comparison(s): No prior Echocardiogram. FINDINGS  Left Ventricle: Left ventricular ejection fraction, by estimation, is 65 to 70%. The left ventricle has normal function. The left ventricle has no regional wall motion abnormalities. The left ventricular internal cavity size was normal in size. There is  mild concentric left ventricular hypertrophy. Left ventricular diastolic parameters are indeterminate. Right Ventricle: The right ventricular size is severely enlarged. No increase in right ventricular wall thickness. Right ventricular systolic function is normal. Tricuspid regurgitation signal is inadequate for assessing PA pressure. Left Atrium: Left atrial size was mildly dilated. Right Atrium: Right atrial size was mildly dilated. Pericardium: There is no evidence of pericardial effusion. Mitral Valve: The mitral valve was not well visualized. No evidence of mitral valve regurgitation. No evidence of mitral valve stenosis. Tricuspid Valve: The tricuspid valve is normal in structure. Tricuspid valve regurgitation is not demonstrated. No evidence of tricuspid stenosis. Aortic Valve: Eccentric aortic regurgitation see in 5 chamber view. The aortic valve is tricuspid. Aortic valve regurgitation  is mild. Aortic regurgitation PHT measures 377 msec. No aortic stenosis is present. Aortic valve peak gradient measures 8.7 mmHg. Pulmonic Valve: The pulmonic valve was not well visualized. Pulmonic valve regurgitation is not visualized. No evidence of pulmonic stenosis. Aorta: Aortic dilatation  noted. There is mild dilatation of the aortic root, measuring 43 mm. IAS/Shunts: The atrial septum is grossly normal.  LEFT VENTRICLE PLAX 2D LVIDd:         4.40 cm   Diastology LVIDs:         2.70 cm   LV e' medial:    11.80 cm/s LV PW:         1.20 cm   LV E/e' medial:  7.6 LV IVS:        1.20 cm   LV e' lateral:   15.20 cm/s LVOT diam:     2.40 cm   LV E/e' lateral: 5.9 LV SV:         81 LV SV Index:   39 LVOT Area:     4.52 cm  RIGHT VENTRICLE             IVC RV S prime:     20.80 cm/s  IVC diam: 2.50 cm TAPSE (M-mode): 1.7 cm LEFT ATRIUM             Index        RIGHT ATRIUM           Index LA diam:        3.80 cm 1.84 cm/m   RA Area:     25.67 cm LA Vol (A2C):   94.9 ml 45.96 ml/m  RA Volume:   77.13 ml  37.35 ml/m LA Vol (A4C):   49.4 ml 23.92 ml/m LA Biplane Vol: 69.6 ml 33.70 ml/m  AORTIC VALVE AV Area (Vmax): 3.88 cm AV Vmax:        147.50 cm/s AV Peak Grad:   8.7 mmHg LVOT Vmax:      126.60 cm/s LVOT Vmean:     74.960 cm/s LVOT VTI:       0.179 m AI PHT:         377 msec  AORTA Ao Root diam: 4.30 cm Ao Asc diam:  4.00 cm MITRAL VALVE               TRICUSPID VALVE MV Area (PHT): 5.60 cm    TR Peak grad:   22.1 mmHg MV Decel Time: 136 msec    TR Vmax:        235.00 cm/s MR Peak grad: 79.9 mmHg MR Vmax:      447.00 cm/s  SHUNTS MV E velocity: 89.65 cm/s  Systemic VTI:  0.18 m MV A velocity: 55.60 cm/s  Systemic Diam: 2.40 cm MV E/A ratio:  1.61 Riley Lam MD Electronically signed by Riley Lam MD Signature Date/Time: 09/15/2023/4:51:28 PM    Final     Cardiac Studies     Patient Profile     87 y.o. 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 Decreased diltiazem to 120 mg daily and dosing in the morning given highest risk for pauses is in the evening during sleep.  Given contraindication to anticoagulation, the patient is not a candidate for rhythm control at this time.  Based on ECG review it appears he has been in atrial fibrillation since 2020 and successful rhythm control would likely be challenging.  He does not currently have an  indication for permanent pacemaker. Echo shows EF 65-70%, normal RV function, mild biatrial enlargement, mild AI, dilated aortic root measuring 43.Marland Kitchen  Hypertension - continue ARB  SDH: neurosurgery recommend hold eliquis x 2 weeks. -Worsening mental status this morning, oriented to person only.  Will discuss with primary team, suspect needs repeat head CT  For questions or updates, please contact [facility] Please consult [email or web address] for contact info under        Signed, Little Ishikawa, MD  09/16/2023, 10:37 AM

## 2023-09-16 NOTE — Progress Notes (Signed)
PT Cancellation Note  Patient Details Name: Mathew Simpson MRN: [id number] DOB: 1936/12/13   Cancelled Treatment:    Reason Eval/Treat Not Completed: Medical issues which prohibited therapy  Definite change in mental status since my last visit with [name]. Restless in bed but repeating that he is trying to fall asleep. Not answering questions or following instructions. Noted that we are awaiting CT results. Will follow-up later this afternoon if appropriate, or possibly tomorrow depending results and schedule.  Kathlyn Sacramento, PT, DPT [facility]  Rehabilitation Services Physical Therapist Office: [phone] Website: [email or web address]   Berton Mount 09/16/2023, 1:46 PM

## 2023-09-16 NOTE — Progress Notes (Signed)
PT Cancellation Note  Patient Details Name: Mathew Simpson MRN: [id number] DOB: [birth year]   Cancelled Treatment:    Reason Eval/Treat Not Completed: Fatigue/lethargy limiting ability to participate  Second attempt to work with pt today after cognitive status change. CT report noted. Pt was up recently in the recliner, but has returned to bed. Now very lethargic, difficulty keeping eyes open, unable to fully participate with PT this afternoon. Will check back tomorrow for further progression towards functional goals.  Kathlyn Sacramento, PT, DPT [facility]  Rehabilitation Services Physical Therapist Office: [phone] Website: [email or web address]

## 2023-09-16 NOTE — Progress Notes (Signed)
PROGRESS NOTE    TAN CHRETIEN  WJX:[id number] DOB: 20-Aug-1936 DOA: 09/08/2023 PCP: [name], PA-C    Brief Narrative:   Mathew Simpson is a 87 y.o. male with past medical history significant for persistent atrial fibrillation on Eliquis, HTN, type 2 diabetes mellitus, vertigo who presented to [facility] ED on 9/17 via EMS from home after mechanical fall at home.  Family believes he may have fallen backwards from the top of the stairs with suspected loss of consciousness.  Found by neighbor.  EMS reports since fall he was having dizziness, nausea, vomiting and saying repetitive questions.  Complicated by Eliquis use, took the medication earlier in the morning.  In the ED, temperature 97.5 F, HR 68, RR 18, BP 149/93, SpO2 94% on room air.  WBC 6.0, hemoglobin 12.7, platelet count 151.  Sodium 138, potassium 4.0, chloride 102, CO2 27, glucose 127, BUN 19, creat 1.30.  AST 24, ALT 19, total bilirubin 1.3.  EtOH level less than 10.  Procalcitonin less than 0.10.  CK 75.  Chest x-ray with low lung volumes, prominent interstitial markings, no acute findings.  Pelvis x-ray negative.  CT head and C-spine without contrast with 3 mm subdural hematoma anterior falx, no midline shift, 6 mm hemorrhagic contusion posterior left frontal lobe, asymmetric widening of the space between the dens and left lateral mass of the C1 relative the right lateral mass, suspicious for worry as some fixation.  Patient was seen by neurosurgery, follow-up CT scans demonstrate stable appearance small anterior falcine subdural and left frontal contusion does not require any acute intervention; Dr. Conchita Paris; with recommendation of holding Eliquis for 2 weeks, no requirement of cervical immobilization and outpatient follow-up in neurosurg clinic as needed.  TRH consulted for admission.  Assessment & Plan:   Subdural hematoma Traumatic brain injury Left frontal contusion  Scalp laceration Mechanical fall Patient presenting via EMS  after a mechanical fall at home striking his head with suspected loss of consciousness.  CT head and C-spine without contrast on admission  with 3 mm subdural hematoma anterior falx, no midline shift, 6 mm hemorrhagic contusion posterior left frontal lobe, asymmetric widening of the space between the dens and left lateral mass of the C1 relative the right lateral mass, suspicious for worry as some fixation.  Patient was seen by neurosurgery, follow-up CT scans demonstrate stable appearance small anterior falcine subdural and left frontal contusion does not require any acute intervention; Dr. Conchita Paris; with recommendation of holding Eliquis for 2 weeks, no requirement of cervical immobilization and outpatient follow-up in neurosurg clinic as needed.  -- Repeat CT head without contrast today due to lethargy/confusion -- Continue to hold Eliquis x 2 weeks -- Continue PT/OT efforts -- Fall precautions -- Staples for scalp laceration will need removal in 7-10 days (placed 9/17)  Persistent atrial fibrillation with sinus pauses Seen by cardiology, appear asymptomatic.  Echocardiogram with LVEF 65 diastolic percent, normal RV function, mild biatrial enlargement, mild AI, dilated aortic root measuring 43 mm.  No indication for permanent pacemaker placement. -- Diltiazem decreased to 120 mg p.o. daily -- Continue monitor on telemetry  Essential hypertension -- Diltiazem 120 mg p.o. daily -- Irbesartan 3 mg p.o. daily  Vertigo -- Continue PT  Type 2 diabetes mellitus Hemoglobin A1c 6.7. -- SSI for coverage -- CBGs qAC/HS  Moderate protein calorie malnutrition Body mass index is 27.98 kg/m. Nutrition Status: Nutrition Problem: Moderate Malnutrition Etiology: social / environmental circumstances Signs/Symptoms: mild fat depletion, mild muscle depletion Interventions:  PROGRESS NOTE    TAN CHRETIEN  WJX:[id number] DOB: 20-Aug-1936 DOA: 09/08/2023 PCP: [name], PA-C    Brief Narrative:   Mathew Simpson is a 87 y.o. male with past medical history significant for persistent atrial fibrillation on Eliquis, HTN, type 2 diabetes mellitus, vertigo who presented to [facility] ED on 9/17 via EMS from home after mechanical fall at home.  Family believes he may have fallen backwards from the top of the stairs with suspected loss of consciousness.  Found by neighbor.  EMS reports since fall he was having dizziness, nausea, vomiting and saying repetitive questions.  Complicated by Eliquis use, took the medication earlier in the morning.  In the ED, temperature 97.5 F, HR 68, RR 18, BP 149/93, SpO2 94% on room air.  WBC 6.0, hemoglobin 12.7, platelet count 151.  Sodium 138, potassium 4.0, chloride 102, CO2 27, glucose 127, BUN 19, creat 1.30.  AST 24, ALT 19, total bilirubin 1.3.  EtOH level less than 10.  Procalcitonin less than 0.10.  CK 75.  Chest x-ray with low lung volumes, prominent interstitial markings, no acute findings.  Pelvis x-ray negative.  CT head and C-spine without contrast with 3 mm subdural hematoma anterior falx, no midline shift, 6 mm hemorrhagic contusion posterior left frontal lobe, asymmetric widening of the space between the dens and left lateral mass of the C1 relative the right lateral mass, suspicious for worry as some fixation.  Patient was seen by neurosurgery, follow-up CT scans demonstrate stable appearance small anterior falcine subdural and left frontal contusion does not require any acute intervention; Dr. Conchita Paris; with recommendation of holding Eliquis for 2 weeks, no requirement of cervical immobilization and outpatient follow-up in neurosurg clinic as needed.  TRH consulted for admission.  Assessment & Plan:   Subdural hematoma Traumatic brain injury Left frontal contusion  Scalp laceration Mechanical fall Patient presenting via EMS  after a mechanical fall at home striking his head with suspected loss of consciousness.  CT head and C-spine without contrast on admission  with 3 mm subdural hematoma anterior falx, no midline shift, 6 mm hemorrhagic contusion posterior left frontal lobe, asymmetric widening of the space between the dens and left lateral mass of the C1 relative the right lateral mass, suspicious for worry as some fixation.  Patient was seen by neurosurgery, follow-up CT scans demonstrate stable appearance small anterior falcine subdural and left frontal contusion does not require any acute intervention; Dr. Conchita Paris; with recommendation of holding Eliquis for 2 weeks, no requirement of cervical immobilization and outpatient follow-up in neurosurg clinic as needed.  -- Repeat CT head without contrast today due to lethargy/confusion -- Continue to hold Eliquis x 2 weeks -- Continue PT/OT efforts -- Fall precautions -- Staples for scalp laceration will need removal in 7-10 days (placed 9/17)  Persistent atrial fibrillation with sinus pauses Seen by cardiology, appear asymptomatic.  Echocardiogram with LVEF 65 diastolic percent, normal RV function, mild biatrial enlargement, mild AI, dilated aortic root measuring 43 mm.  No indication for permanent pacemaker placement. -- Diltiazem decreased to 120 mg p.o. daily -- Continue monitor on telemetry  Essential hypertension -- Diltiazem 120 mg p.o. daily -- Irbesartan 3 mg p.o. daily  Vertigo -- Continue PT  Type 2 diabetes mellitus Hemoglobin A1c 6.7. -- SSI for coverage -- CBGs qAC/HS  Moderate protein calorie malnutrition Body mass index is 27.98 kg/m. Nutrition Status: Nutrition Problem: Moderate Malnutrition Etiology: social / environmental circumstances Signs/Symptoms: mild fat depletion, mild muscle depletion Interventions:  168 hours. No results for input(s): "AMMONIA" in the last 168 hours. Coagulation Profile: No results for input(s): "INR", "PROTIME" in the last 168 hours. Cardiac Enzymes: No results for input(s): "CKTOTAL", "CKMB", "CKMBINDEX", "TROPONINI" in the last 168 hours. BNP (last 3 results) No results for input(s): "PROBNP" in the last 8760 hours. HbA1C: No results for input(s): "HGBA1C" in the last 72 hours. CBG: Recent Labs  Lab 09/15/23 1225 09/15/23 1657 09/15/23 2142 09/16/23 0627 09/16/23 1235  GLUCAP 94 219* 168* 152* 134*   Lipid Profile: No results for input(s): "CHOL", "HDL", "LDLCALC", "TRIG", "CHOLHDL",  "LDLDIRECT" in the last 72 hours. Thyroid Function Tests: Recent Labs    09/15/23 0559  FREET4 1.07   Anemia Panel: No results for input(s): "VITAMINB12", "FOLATE", "FERRITIN", "TIBC", "IRON", "RETICCTPCT" in the last 72 hours. Sepsis Labs: No results for input(s): "PROCALCITON", "LATICACIDVEN" in the last 168 hours.  No results found for this or any previous visit (from the past 240 hour(s)).       Radiology Studies: ECHOCARDIOGRAM COMPLETE  Result Date: 09/15/2023    ECHOCARDIOGRAM REPORT   Patient Name:   SHAIDEN JORY Date of Exam: 09/15/2023 Medical Rec #:  [id number]     Height:       70.0 in Accession #:    [id number]    Weight:       195.0 lb Date of Birth:  09/08/1936      BSA:          2.065 m Patient Age:    87 years      BP:           153/77 mmHg Patient Gender: M             HR:           81 bpm. Exam Location:  Inpatient Procedure: 2D Echo, Cardiac Doppler and Color Doppler Indications:    Abnormal ECG R94.31  History:        Patient has no prior history of Echocardiogram examinations.                 Arrythmias:Atrial Fibrillation; Risk Factors:Hypertension and                 Diabetes.  Sonographer:    Lucendia Herrlich Referring Phys: [id number] CAROLE N HALL  Sonographer Comments: Image acquisition challenging due to patient body habitus. IMPRESSIONS  1. Left ventricular ejection fraction, by estimation, is 65 to 70%. The left ventricle has normal function. The left ventricle has no regional wall motion abnormalities. There is mild concentric left ventricular hypertrophy. Left ventricular diastolic parameters are indeterminate.  2. Right ventricular systolic function is normal. The right ventricular size is severely enlarged. Tricuspid regurgitation signal is inadequate for assessing PA pressure.  3. Left atrial size was mildly dilated.  4. Right atrial size was mildly dilated.  5. The mitral valve was not well visualized. No evidence of mitral valve regurgitation. No evidence of  mitral stenosis.  6. Eccentric aortic regurgitation see in 5 chamber view. The aortic valve is tricuspid. Aortic valve regurgitation is mild. No aortic stenosis is present.  7. Aortic dilatation noted. There is mild dilatation of the aortic root, measuring 43 mm. Comparison(s): No prior Echocardiogram. FINDINGS  Left Ventricle: Left ventricular ejection fraction, by estimation, is 65 to 70%. The left ventricle has normal function. The left ventricle has no regional wall motion abnormalities. The left ventricular internal cavity size was normal in size. There is  mild concentric left  168 hours. No results for input(s): "AMMONIA" in the last 168 hours. Coagulation Profile: No results for input(s): "INR", "PROTIME" in the last 168 hours. Cardiac Enzymes: No results for input(s): "CKTOTAL", "CKMB", "CKMBINDEX", "TROPONINI" in the last 168 hours. BNP (last 3 results) No results for input(s): "PROBNP" in the last 8760 hours. HbA1C: No results for input(s): "HGBA1C" in the last 72 hours. CBG: Recent Labs  Lab 09/15/23 1225 09/15/23 1657 09/15/23 2142 09/16/23 0627 09/16/23 1235  GLUCAP 94 219* 168* 152* 134*   Lipid Profile: No results for input(s): "CHOL", "HDL", "LDLCALC", "TRIG", "CHOLHDL",  "LDLDIRECT" in the last 72 hours. Thyroid Function Tests: Recent Labs    09/15/23 0559  FREET4 1.07   Anemia Panel: No results for input(s): "VITAMINB12", "FOLATE", "FERRITIN", "TIBC", "IRON", "RETICCTPCT" in the last 72 hours. Sepsis Labs: No results for input(s): "PROCALCITON", "LATICACIDVEN" in the last 168 hours.  No results found for this or any previous visit (from the past 240 hour(s)).       Radiology Studies: ECHOCARDIOGRAM COMPLETE  Result Date: 09/15/2023    ECHOCARDIOGRAM REPORT   Patient Name:   SHAIDEN JORY Date of Exam: 09/15/2023 Medical Rec #:  [id number]     Height:       70.0 in Accession #:    [id number]    Weight:       195.0 lb Date of Birth:  09/08/1936      BSA:          2.065 m Patient Age:    87 years      BP:           153/77 mmHg Patient Gender: M             HR:           81 bpm. Exam Location:  Inpatient Procedure: 2D Echo, Cardiac Doppler and Color Doppler Indications:    Abnormal ECG R94.31  History:        Patient has no prior history of Echocardiogram examinations.                 Arrythmias:Atrial Fibrillation; Risk Factors:Hypertension and                 Diabetes.  Sonographer:    Lucendia Herrlich Referring Phys: [id number] CAROLE N HALL  Sonographer Comments: Image acquisition challenging due to patient body habitus. IMPRESSIONS  1. Left ventricular ejection fraction, by estimation, is 65 to 70%. The left ventricle has normal function. The left ventricle has no regional wall motion abnormalities. There is mild concentric left ventricular hypertrophy. Left ventricular diastolic parameters are indeterminate.  2. Right ventricular systolic function is normal. The right ventricular size is severely enlarged. Tricuspid regurgitation signal is inadequate for assessing PA pressure.  3. Left atrial size was mildly dilated.  4. Right atrial size was mildly dilated.  5. The mitral valve was not well visualized. No evidence of mitral valve regurgitation. No evidence of  mitral stenosis.  6. Eccentric aortic regurgitation see in 5 chamber view. The aortic valve is tricuspid. Aortic valve regurgitation is mild. No aortic stenosis is present.  7. Aortic dilatation noted. There is mild dilatation of the aortic root, measuring 43 mm. Comparison(s): No prior Echocardiogram. FINDINGS  Left Ventricle: Left ventricular ejection fraction, by estimation, is 65 to 70%. The left ventricle has normal function. The left ventricle has no regional wall motion abnormalities. The left ventricular internal cavity size was normal in size. There is  mild concentric left  168 hours. No results for input(s): "AMMONIA" in the last 168 hours. Coagulation Profile: No results for input(s): "INR", "PROTIME" in the last 168 hours. Cardiac Enzymes: No results for input(s): "CKTOTAL", "CKMB", "CKMBINDEX", "TROPONINI" in the last 168 hours. BNP (last 3 results) No results for input(s): "PROBNP" in the last 8760 hours. HbA1C: No results for input(s): "HGBA1C" in the last 72 hours. CBG: Recent Labs  Lab 09/15/23 1225 09/15/23 1657 09/15/23 2142 09/16/23 0627 09/16/23 1235  GLUCAP 94 219* 168* 152* 134*   Lipid Profile: No results for input(s): "CHOL", "HDL", "LDLCALC", "TRIG", "CHOLHDL",  "LDLDIRECT" in the last 72 hours. Thyroid Function Tests: Recent Labs    09/15/23 0559  FREET4 1.07   Anemia Panel: No results for input(s): "VITAMINB12", "FOLATE", "FERRITIN", "TIBC", "IRON", "RETICCTPCT" in the last 72 hours. Sepsis Labs: No results for input(s): "PROCALCITON", "LATICACIDVEN" in the last 168 hours.  No results found for this or any previous visit (from the past 240 hour(s)).       Radiology Studies: ECHOCARDIOGRAM COMPLETE  Result Date: 09/15/2023    ECHOCARDIOGRAM REPORT   Patient Name:   SHAIDEN JORY Date of Exam: 09/15/2023 Medical Rec #:  [id number]     Height:       70.0 in Accession #:    [id number]    Weight:       195.0 lb Date of Birth:  09/08/1936      BSA:          2.065 m Patient Age:    87 years      BP:           153/77 mmHg Patient Gender: M             HR:           81 bpm. Exam Location:  Inpatient Procedure: 2D Echo, Cardiac Doppler and Color Doppler Indications:    Abnormal ECG R94.31  History:        Patient has no prior history of Echocardiogram examinations.                 Arrythmias:Atrial Fibrillation; Risk Factors:Hypertension and                 Diabetes.  Sonographer:    Lucendia Herrlich Referring Phys: [id number] CAROLE N HALL  Sonographer Comments: Image acquisition challenging due to patient body habitus. IMPRESSIONS  1. Left ventricular ejection fraction, by estimation, is 65 to 70%. The left ventricle has normal function. The left ventricle has no regional wall motion abnormalities. There is mild concentric left ventricular hypertrophy. Left ventricular diastolic parameters are indeterminate.  2. Right ventricular systolic function is normal. The right ventricular size is severely enlarged. Tricuspid regurgitation signal is inadequate for assessing PA pressure.  3. Left atrial size was mildly dilated.  4. Right atrial size was mildly dilated.  5. The mitral valve was not well visualized. No evidence of mitral valve regurgitation. No evidence of  mitral stenosis.  6. Eccentric aortic regurgitation see in 5 chamber view. The aortic valve is tricuspid. Aortic valve regurgitation is mild. No aortic stenosis is present.  7. Aortic dilatation noted. There is mild dilatation of the aortic root, measuring 43 mm. Comparison(s): No prior Echocardiogram. FINDINGS  Left Ventricle: Left ventricular ejection fraction, by estimation, is 65 to 70%. The left ventricle has normal function. The left ventricle has no regional wall motion abnormalities. The left ventricular internal cavity size was normal in size. There is  mild concentric left

## 2023-09-16 NOTE — Progress Notes (Signed)
Occupational Therapy Treatment Patient Details Name: Mathew Simpson MRN: [id number] DOB: [birth year] Today's Date: 09/16/2023   History of present illness Mathew Simpson is a 87 y.o. male admitted 9/17 to the hospital after presenting to the emergency department after a fall.  CT scan was therefore obtained which demonstrated a small intracranial hemorrhage in the left frontal/parietal region. +dizziness PMH for hypertension, glaucoma, hx of B TKR.   OT comments  Patient approached this morning for OT treatment with patient in supine. Patient stating he did not feel well and asked therapist to return later. COTA returned shortly with bed alarm going off and patient seated on EOB. Patient asking to stand to urinate even though he was wearing condom catheter. Patient returned to EOB and asked to stay on EOB instead of returning to supine. Patient was instructed that due to his dizziness it wasn't safe to stay on EOB and agreed to transfer to recliner for breakfast. Patient required frequent cues to stay on tasks and for safety due to lethargic and appeared more confused on this date. Patient left in chair with breakfast and provided with heat pack for back pain. Patient will benefit from continued inpatient follow up therapy, <3 hours/day to continue to address balance, cognitions, bathing, and dressing to allow for safe discharge home. Acute OT to continue to follow.       If plan is discharge home, recommend the following:  A little help with walking and/or transfers;A little help with bathing/dressing/bathroom;Assist for transportation;Help with stairs or ramp for entrance;Assistance with cooking/housework   Equipment Recommendations  None recommended by OT    Recommendations for Other Services      Precautions / Restrictions Precautions Precautions: Fall Precaution Comments: likely BPPV component Restrictions Weight Bearing Restrictions: No       Mobility Bed Mobility Overal bed mobility:  Needs Assistance             General bed mobility comments: seated on EOB    Transfers Overall transfer level: Needs assistance Equipment used: Rolling walker (2 wheels) Transfers: Sit to/from Stand, Bed to chair/wheelchair/BSC Sit to Stand: Min assist     Step pivot transfers: Min assist     General transfer comment: min assist for sit to stand and for transfers due to pain     Balance Overall balance assessment: Needs assistance Sitting-balance support: No upper extremity supported, Feet supported Sitting balance-Leahy Scale: Good Sitting balance - Comments: seated on EOB upon entry   Standing balance support: Single extremity supported, Bilateral upper extremity supported, No upper extremity supported Standing balance-Leahy Scale: Poor Standing balance comment: reliant on external assist for balance                           ADL either performed or assessed with clinical judgement   ADL Overall ADL's : Needs assistance/impaired Eating/Feeding: Set up;Sitting Eating/Feeding Details (indicate cue type and reason): assistance with setup Grooming: Wash/dry hands;Wash/dry face;Set up;Sitting Grooming Details (indicate cue type and reason): in recliner                               General ADL Comments: no complaints of dizziness but patient stating he does not feel well and has back pain    Extremity/Trunk Assessment              Vision       Perception  Praxis      Cognition Arousal: Lethargic Behavior During Therapy: WFL for tasks assessed/performed Overall Cognitive Status: No family/caregiver present to determine baseline cognitive functioning                                 General Comments: increased time to follow directions on this date with patient being lethargic, cues to stay on tasks and for sequencing        Exercises      Shoulder Instructions       General Comments      Pertinent  Vitals/ Pain       Pain Assessment Pain Assessment: Faces Faces Pain Scale: Hurts even more Pain Location: back pain Pain Descriptors / Indicators: Aching, Grimacing Pain Intervention(s): Limited activity within patient's tolerance, Monitored during session, Repositioned, Patient requesting pain meds-RN notified, Heat applied  Home Living                                          Prior Functioning/Environment              Frequency  Min 1X/week        Progress Toward Goals  OT Goals(current goals can now be found in the care plan section)  Progress towards OT goals: Progressing toward goals  Acute Rehab OT Goals Patient Stated Goal: get better OT Goal Formulation: With patient Time For Goal Achievement: 09/23/23 Potential to Achieve Goals: Good ADL Goals Pt Will Perform Grooming: with supervision;standing Pt Will Perform Lower Body Bathing: with supervision;sit to/from stand Pt Will Perform Lower Body Dressing: with supervision;sit to/from stand Pt Will Transfer to Toilet: with supervision;ambulating Pt Will Perform Toileting - Clothing Manipulation and hygiene: with supervision;sit to/from stand Additional ADL Goal #1: Pt will report dizziness less than 2/10 with positional changes during bed mobility and transfers.  Plan      Co-evaluation                 AM-PAC OT "6 Clicks" Daily Activity     Outcome Measure   Help from another person eating meals?: None Help from another person taking care of personal grooming?: A Little Help from another person toileting, which includes using toliet, bedpan, or urinal?: A Little Help from another person bathing (including washing, rinsing, drying)?: A Little Help from another person to put on and taking off regular upper body clothing?: A Little Help from another person to put on and taking off regular lower body clothing?: A Little 6 Click Score: 19    End of Session Equipment Utilized During  Treatment: Gait belt;Rolling walker (2 wheels)  OT Visit Diagnosis: Unsteadiness on feet (R26.81);Other abnormalities of gait and mobility (R26.89);Repeated falls (R29.6);Pain Pain - part of body:  (back)   Activity Tolerance Patient limited by pain   Patient Left in chair;with call bell/phone within reach;with chair alarm set   Nurse Communication Mobility status;Patient requests pain meds;Other (comment) (patient not feeling well)        Time: 9528-4132 OT Time Calculation (min): 22 min  Charges: OT General Charges $OT Visit: 1 Visit OT Treatments $Self Care/Home Management : 8-22 mins  Alfonse Flavors, OTA Acute Rehabilitation Services  Office [phone]   Dewain Penning 09/16/2023, 10:51 AM

## 2023-09-16 NOTE — Progress Notes (Signed)
Patient ID: Mathew Simpson, male   DOB: [birth year], 87 y.o.   MRN: [id number]  Nightshift RN reported that patient jumped out of bed about every 20 minutes last night setting off alarms due to confusion and having to urinate. Patient very somnolent this morning. Spent approximately 35 minutes with patient due to confusion on how to open mouth in order to take medication. Patient also unable to understand how to use straw to drink water. Will continue to monitor.  Lidia Collum, RN

## 2023-09-16 NOTE — TOC Progression Note (Signed)
Transition of Care [facility]) - Progression Note    Patient Details  Name: Mathew Simpson MRN: [id number] Date of Birth: 1936/04/11  Transition of Care [facility]) CM/SW Contact  Baldemar Lenis, [place] Phone Number: 09/16/2023, 3:57 PM  Clinical Narrative:   CSW updated by [name], granddaughter, earlier today that appeal was submitted. CSW received call from [facility] to send in clinicals to support the appeal. CSW asked therapy to place updates to send in for appeal, but patient with worsening confusion and lethargy today, holding on PT session. CSW to send updates to [facility] to support appeal when available, pending patient is medically stable to continue to pursue SNF. CSW to follow.    Expected Discharge Plan: Skilled Nursing Facility Barriers to Discharge: Continued Medical Work up, English as a second language teacher  Expected Discharge Plan and Services     Post Acute Care Choice: Skilled Nursing Facility Living arrangements for the past 2 months: Single Family Home                                       Social Determinants of Health (SDOH) Interventions SDOH Screenings   Food Insecurity: No Food Insecurity (09/10/2023)  Housing: Low Risk  (09/10/2023)  Transportation Needs: No Transportation Needs (09/10/2023)  Utilities: Not At Risk (09/10/2023)  Tobacco Use: Medium Risk (09/08/2023)    Readmission Risk Interventions    07/23/2022   11:40 AM 07/21/2022   10:11 AM  Readmission Risk Prevention Plan  Post Dischage Appt Complete Complete  Medication Screening Complete Complete  Transportation Screening Complete Complete

## 2023-09-17 DIAGNOSIS — I77810 Thoracic aortic ectasia: Secondary | ICD-10-CM | POA: Diagnosis present

## 2023-09-17 DIAGNOSIS — I4811 Longstanding persistent atrial fibrillation: Secondary | ICD-10-CM | POA: Diagnosis present

## 2023-09-17 DIAGNOSIS — I808 Phlebitis and thrombophlebitis of other sites: Secondary | ICD-10-CM | POA: Diagnosis not present

## 2023-09-17 DIAGNOSIS — S065XAA Traumatic subdural hemorrhage with loss of consciousness status unknown, initial encounter: Secondary | ICD-10-CM | POA: Diagnosis present

## 2023-09-17 DIAGNOSIS — A4101 Sepsis due to Methicillin susceptible Staphylococcus aureus: Secondary | ICD-10-CM | POA: Diagnosis not present

## 2023-09-17 DIAGNOSIS — I4891 Unspecified atrial fibrillation: Secondary | ICD-10-CM | POA: Diagnosis not present

## 2023-09-17 DIAGNOSIS — W109XXA Fall (on) (from) unspecified stairs and steps, initial encounter: Secondary | ICD-10-CM | POA: Diagnosis present

## 2023-09-17 DIAGNOSIS — E1165 Type 2 diabetes mellitus with hyperglycemia: Secondary | ICD-10-CM | POA: Diagnosis present

## 2023-09-17 DIAGNOSIS — I482 Chronic atrial fibrillation, unspecified: Secondary | ICD-10-CM | POA: Diagnosis not present

## 2023-09-17 DIAGNOSIS — F17210 Nicotine dependence, cigarettes, uncomplicated: Secondary | ICD-10-CM | POA: Diagnosis present

## 2023-09-17 DIAGNOSIS — E440 Moderate protein-calorie malnutrition: Secondary | ICD-10-CM | POA: Diagnosis present

## 2023-09-17 DIAGNOSIS — M00062 Staphylococcal arthritis, left knee: Secondary | ICD-10-CM | POA: Diagnosis not present

## 2023-09-17 DIAGNOSIS — Y831 Surgical operation with implant of artificial internal device as the cause of abnormal reaction of the patient, or of later complication, without mention of misadventure at the time of the procedure: Secondary | ICD-10-CM | POA: Diagnosis not present

## 2023-09-17 DIAGNOSIS — T8450XD Infection and inflammatory reaction due to unspecified internal joint prosthesis, subsequent encounter: Secondary | ICD-10-CM | POA: Diagnosis not present

## 2023-09-17 DIAGNOSIS — R7881 Bacteremia: Secondary | ICD-10-CM | POA: Diagnosis not present

## 2023-09-17 DIAGNOSIS — Y95 Nosocomial condition: Secondary | ICD-10-CM | POA: Diagnosis not present

## 2023-09-17 DIAGNOSIS — Y92009 Unspecified place in unspecified non-institutional (private) residence as the place of occurrence of the external cause: Secondary | ICD-10-CM | POA: Diagnosis not present

## 2023-09-17 DIAGNOSIS — H409 Unspecified glaucoma: Secondary | ICD-10-CM | POA: Diagnosis present

## 2023-09-17 DIAGNOSIS — R001 Bradycardia, unspecified: Secondary | ICD-10-CM | POA: Diagnosis not present

## 2023-09-17 DIAGNOSIS — M549 Dorsalgia, unspecified: Secondary | ICD-10-CM | POA: Diagnosis present

## 2023-09-17 DIAGNOSIS — E119 Type 2 diabetes mellitus without complications: Secondary | ICD-10-CM | POA: Diagnosis not present

## 2023-09-17 DIAGNOSIS — I4819 Other persistent atrial fibrillation: Secondary | ICD-10-CM | POA: Diagnosis not present

## 2023-09-17 DIAGNOSIS — B9561 Methicillin susceptible Staphylococcus aureus infection as the cause of diseases classified elsewhere: Secondary | ICD-10-CM | POA: Diagnosis not present

## 2023-09-17 DIAGNOSIS — Y828 Other medical devices associated with adverse incidents: Secondary | ICD-10-CM | POA: Diagnosis not present

## 2023-09-17 DIAGNOSIS — T8454XA Infection and inflammatory reaction due to internal left knee prosthesis, initial encounter: Secondary | ICD-10-CM | POA: Diagnosis not present

## 2023-09-17 DIAGNOSIS — E876 Hypokalemia: Secondary | ICD-10-CM | POA: Diagnosis present

## 2023-09-17 DIAGNOSIS — S065X9A Traumatic subdural hemorrhage with loss of consciousness of unspecified duration, initial encounter: Secondary | ICD-10-CM | POA: Diagnosis present

## 2023-09-17 DIAGNOSIS — S0101XA Laceration without foreign body of scalp, initial encounter: Secondary | ICD-10-CM | POA: Diagnosis present

## 2023-09-17 DIAGNOSIS — T80211A Bloodstream infection due to central venous catheter, initial encounter: Secondary | ICD-10-CM | POA: Diagnosis not present

## 2023-09-17 DIAGNOSIS — Z880 Allergy status to penicillin: Secondary | ICD-10-CM | POA: Diagnosis not present

## 2023-09-17 DIAGNOSIS — R627 Adult failure to thrive: Secondary | ICD-10-CM | POA: Diagnosis present

## 2023-09-17 DIAGNOSIS — S06309D Unspecified focal traumatic brain injury with loss of consciousness of unspecified duration, subsequent encounter: Secondary | ICD-10-CM | POA: Diagnosis not present

## 2023-09-17 DIAGNOSIS — Z23 Encounter for immunization: Secondary | ICD-10-CM | POA: Diagnosis present

## 2023-09-17 DIAGNOSIS — I10 Essential (primary) hypertension: Secondary | ICD-10-CM | POA: Diagnosis present

## 2023-09-17 DIAGNOSIS — F05 Delirium due to known physiological condition: Secondary | ICD-10-CM | POA: Diagnosis not present

## 2023-09-17 LAB — COMPREHENSIVE METABOLIC PANEL
ALT: 28 U/L (ref 0–44)
AST: 24 U/L (ref 15–41)
Albumin: 3.3 g/dL — ABNORMAL LOW (ref 3.5–5.0)
Alkaline Phosphatase: 78 U/L (ref 38–126)
Anion gap: 9 (ref 5–15)
BUN: 21 mg/dL (ref 8–23)
CO2: 22 mmol/L (ref 22–32)
Calcium: 8.8 mg/dL — ABNORMAL LOW (ref 8.9–10.3)
Chloride: 105 mmol/L (ref 98–111)
Creatinine, Ser: 1.14 mg/dL (ref 0.61–1.24)
GFR, Estimated: >60 mL/min (ref >60)
Glucose, Bld: 143 mg/dL — ABNORMAL HIGH (ref 70–99)
Potassium: 3.6 mmol/L (ref 3.5–5.1)
Sodium: 136 mmol/L (ref 135–145)
Total Bilirubin: 1.5 mg/dL — ABNORMAL HIGH (ref 0.3–1.2)
Total Protein: 6.2 g/dL — ABNORMAL LOW (ref 6.5–8.1)

## 2023-09-17 LAB — GLUCOSE, CAPILLARY
Glucose-Capillary: 122 mg/dL — ABNORMAL HIGH (ref 70–99)
Glucose-Capillary: 235 mg/dL — ABNORMAL HIGH (ref 70–99)
Glucose-Capillary: 116 mg/dL — ABNORMAL HIGH (ref 70–99)
Glucose-Capillary: 159 mg/dL — ABNORMAL HIGH (ref 70–99)

## 2023-09-17 LAB — CBC
HCT: 37.3 % — ABNORMAL LOW (ref 39.0–52.0)
Hemoglobin: 13.2 g/dL (ref 13.0–17.0)
MCH: 32 pg (ref 26.0–34.0)
MCHC: 35.4 g/dL (ref 30.0–36.0)
MCV: 90.3 fL (ref 80.0–100.0)
Platelets: 166 10*3/uL (ref 150–400)
RBC: 4.13 MIL/uL — ABNORMAL LOW (ref 4.22–5.81)
RDW: 13.7 % (ref 11.5–15.5)
WBC: 6.8 10*3/uL (ref 4.0–10.5)
nRBC: 0 % (ref 0.0–0.2)

## 2023-09-17 LAB — PHOSPHORUS: Phosphorus: 3.6 mg/dL (ref 2.5–4.6)

## 2023-09-17 LAB — MAGNESIUM: Magnesium: 2.1 mg/dL (ref 1.7–2.4)

## 2023-09-17 NOTE — Progress Notes (Signed)
Physical Therapy Treatment Patient Details Name: Mathew Simpson MRN: [id number] DOB: 05-30-1936 Today's Date: 09/17/2023   History of Present Illness Mathew Simpson is a 87 y.o. male admitted 9/17 to the hospital after presenting to the emergency department after a fall.  CT scan was therefore obtained which demonstrated a small intracranial hemorrhage in the left frontal/parietal region. +dizziness PMH for hypertension, glaucoma, hx of B TKR.    PT Comments  More alert and improved cognitive status compared to my attempts to work with pt yesterday. Pt disoriented to year only today (although still having some delayed processing and requiring verbal cues intermittently for physical tasks.) Dix-hallpike and horiziontal roll testing all negative today and pt asymptomatic of vertigo in previously provocative positions. Suspected BPPV appears resolved. Tolerated transfer training with min assist for posterior LOB. Gait training with and without RW - early min assist, progressed to CGA level in hallway. Lateral sway with Rt drift, and RLE functional weakness during gait, improved with support from RW. Cues for awareness and techniques for appropriate AD use. Patient will continue to benefit from skilled physical therapy services to further improve independence with functional mobility. Patient will benefit from continued inpatient follow up therapy, <3 hours/day.   If plan is discharge home, recommend the following: A little help with walking and/or transfers;A little help with bathing/dressing/bathroom;Assistance with cooking/housework;Direct supervision/assist for medications management;Direct supervision/assist for financial management;Assist for transportation;Help with stairs or ramp for entrance;Supervision due to cognitive status   Can travel by private vehicle     Yes  Equipment Recommendations  None recommended by PT    Recommendations for Other Services       Precautions / Restrictions  Precautions Precautions: Fall Restrictions Weight Bearing Restrictions: No     Mobility  Bed Mobility Overal bed mobility: Needs Assistance Bed Mobility: Rolling, Sidelying to Sit Rolling: Contact guard assist Sidelying to sit: Contact guard assist       General bed mobility comments: CGA with cues for rail use. Able to roll and rise with effort, no physical assist. Able to pull himself into long sit position with HOB elevated and use of rail to pull with UEs.    Transfers Overall transfer level: Needs assistance Equipment used: Rolling walker (2 wheels) Transfers: Sit to/from Stand Sit to Stand: Min assist           General transfer comment: Min assist for boost and to correct LOB which occurs towards posterior. VC for hand placement, technique, and awareness.    Ambulation/Gait Ambulation/Gait assistance: Min assist, Contact guard assist Gait Distance (Feet): 110 Feet Assistive device: Rolling walker (2 wheels), None, 1 person hand held assist Gait Pattern/deviations: Step-through pattern, Wide base of support, Antalgic, Drifts right/left, Decreased stride length Gait velocity: reduced Gait velocity interpretation: <1.31 ft/sec, indicative of household ambulator   General Gait Details: Gait training with and without  AD. Requires infrequent and early min assist for balance. Without AD pt demonstrates increased sway and Rt drift. With RW pt needs VC for RLE placement during right hand turns, and for proximity to device. No overt buckling. Hand held support without AD, slower speed and guarded.   Stairs             Wheelchair Mobility     Tilt Bed    Modified Rankin (Stroke Patients Only)       Balance Overall balance assessment: Needs assistance Sitting-balance support: No upper extremity supported, Feet supported Sitting balance-Leahy Scale: Good Sitting balance - Comments: sits  EOB without assist today.   Standing balance support: No upper  extremity supported Standing balance-Leahy Scale: Fair Standing balance comment: Initially required min assist and external support. Towards end of session pt with fair balance standing without UE support at CGA level.                            Cognition Arousal: Alert Behavior During Therapy: WFL for tasks assessed/performed Overall Cognitive Status: No family/caregiver present to determine baseline cognitive functioning                                 General Comments: Disoriented to year. Aware of location and that he fell. Delayed processing with simple motor commands.        Exercises General Exercises - Lower Extremity Ankle Circles/Pumps: AROM, Both, 10 reps, Seated Quad Sets: Strengthening, Both, 10 reps, Seated Gluteal Sets: Strengthening, Both, 15 reps, Seated Long Arc Quad: Strengthening, Both, Seated, 15 reps Hip Flexion/Marching: Strengthening, Both, 15 reps, Seated    General Comments General comments (skin integrity, edema, etc.): Dix-hallpike and horizontal roll testing negative; pt asymptomatic. BPPV appears resolved.      Pertinent Vitals/Pain Pain Assessment Pain Assessment: Faces Faces Pain Scale: Hurts even more Pain Location: back with supine and standing. Pain Descriptors / Indicators: Aching, Grimacing Pain Intervention(s): Monitored during session, Repositioned, Limited activity within patient's tolerance    Home Living                          Prior Function            PT Goals (current goals can now be found in the care plan section) Acute Rehab PT Goals Patient Stated Goal: Feel better PT Goal Formulation: With patient Time For Goal Achievement: 09/23/23 Potential to Achieve Goals: Good Progress towards PT goals: Progressing toward goals    Frequency    Min 1X/week      PT Plan      Co-evaluation              AM-PAC PT "6 Clicks" Mobility   Outcome Measure  Help needed turning from  your back to your side while in a flat bed without using bedrails?: A Little Help needed moving from lying on your back to sitting on the side of a flat bed without using bedrails?: A Little Help needed moving to and from a bed to a chair (including a wheelchair)?: A Little Help needed standing up from a chair using your arms (e.g., wheelchair or bedside chair)?: A Little Help needed to walk in hospital room?: A Little Help needed climbing 3-5 steps with a railing? : A Little 6 Click Score: 18    End of Session Equipment Utilized During Treatment: Gait belt Activity Tolerance: Patient tolerated treatment well Patient left: in chair;with call bell/phone within reach;with chair alarm set;with nursing/sitter in room Nurse Communication: Mobility status PT Visit Diagnosis: Other abnormalities of gait and mobility (R26.89);History of falling (Z91.81);Muscle weakness (generalized) (M62.81);BPPV;Dizziness and giddiness (R42);Pain BPPV - Right/Left : Right Pain - part of body:  (back)     Time: 0865-7846 PT Time Calculation (min) (ACUTE ONLY): 18 min  Charges:    $Gait Training: 8-22 mins PT General Charges $$ ACUTE PT VISIT: 1 Visit  Kathlyn Sacramento, PT, DPT [facility]  Rehabilitation Services Physical Therapist Office: [phone] Website: [email or web address]    Berton Mount 09/17/2023, 10:44 AM

## 2023-09-17 NOTE — Progress Notes (Signed)
Patient ID: Mathew Simpson, male   DOB: [birth year], 87 y.o.   MRN: [id number]  Granddaughter, [name], updated via telephone.  Lidia Collum, RN

## 2023-09-17 NOTE — TOC Progression Note (Signed)
Transition of Care [facility]) - Progression Note    Patient Details  Name: Mathew Simpson MRN: [id number] Date of Birth: 1936/04/21  Transition of Care [facility]) CM/SW Contact  Baldemar Lenis, [place] Phone Number: 09/17/2023, 4:26 PM  Clinical Narrative:   CSW noting patient has improved. CSW printed clinical information to support the appeal for SNF denial and faxed to [facility]. CSW updated granddaughter that clinical information has been faxed. Awaiting appeal decision. CSW to follow.    Expected Discharge Plan: Skilled Nursing Facility Barriers to Discharge: Continued Medical Work up, English as a second language teacher  Expected Discharge Plan and Services     Post Acute Care Choice: Skilled Nursing Facility Living arrangements for the past 2 months: Single Family Home                                       Social Determinants of Health (SDOH) Interventions SDOH Screenings   Food Insecurity: No Food Insecurity (09/10/2023)  Housing: Low Risk  (09/10/2023)  Transportation Needs: No Transportation Needs (09/10/2023)  Utilities: Not At Risk (09/10/2023)  Tobacco Use: Medium Risk (09/08/2023)    Readmission Risk Interventions    07/23/2022   11:40 AM 07/21/2022   10:11 AM  Readmission Risk Prevention Plan  Post Dischage Appt Complete Complete  Medication Screening Complete Complete  Transportation Screening Complete Complete

## 2023-09-17 NOTE — Progress Notes (Addendum)
PROGRESS NOTE    KASTEN UEHLING  WUJ:[id number] DOB: [birth year] DOA: 09/08/2023 PCP: [name], PA-C    Brief Narrative:   Mathew Simpson is a 87 y.o. male with past medical history significant for persistent atrial fibrillation on Eliquis, HTN, type 2 diabetes mellitus, vertigo who presented to [facility] ED on 9/17 via EMS from home after mechanical fall at home.  Family believes he may have fallen backwards from the top of the stairs with suspected loss of consciousness.  Found by neighbor.  EMS reports since fall he was having dizziness, nausea, vomiting and saying repetitive questions.  Complicated by Eliquis use, took the medication earlier in the morning.  In the ED, temperature 97.5 F, HR 68, RR 18, BP 149/93, SpO2 94% on room air.  WBC 6.0, hemoglobin 12.7, platelet count 151.  Sodium 138, potassium 4.0, chloride 102, CO2 27, glucose 127, BUN 19, creat 1.30.  AST 24, ALT 19, total bilirubin 1.3.  EtOH level less than 10.  Procalcitonin less than 0.10.  CK 75.  Chest x-ray with low lung volumes, prominent interstitial markings, no acute findings.  Pelvis x-ray negative.  CT head and C-spine without contrast with 3 mm subdural hematoma anterior falx, no midline shift, 6 mm hemorrhagic contusion posterior left frontal lobe, asymmetric widening of the space between the dens and left lateral mass of the C1 relative the right lateral mass, suspicious for worry as some fixation.  Patient was seen by neurosurgery, follow-up CT scans demonstrate stable appearance small anterior falcine subdural and left frontal contusion does not require any acute intervention; Dr. Conchita Paris; with recommendation of holding Eliquis for 2 weeks, no requirement of cervical immobilization and outpatient follow-up in neurosurg clinic as needed.  TRH consulted for admission.  Assessment & Plan:   Subdural hematoma Traumatic brain injury Left frontal contusion  Scalp laceration Mechanical fall Patient presenting via EMS  after a mechanical fall at home striking his head with suspected loss of consciousness.  CT head and C-spine without contrast on admission  with 3 mm subdural hematoma anterior falx, no midline shift, 6 mm hemorrhagic contusion posterior left frontal lobe, asymmetric widening of the space between the dens and left lateral mass of the C1 relative the right lateral mass, suspicious for worry as some fixation.  Patient was seen by neurosurgery, follow-up CT scans demonstrate stable appearance small anterior falcine subdural and left frontal contusion does not require any acute intervention; Dr. Conchita Paris; with recommendation of holding Eliquis for 2 weeks, no requirement of cervical immobilization and outpatient follow-up in neurosurg clinic as needed.  -- Repeat CT head without contrast 9/25 w/ stable SDH -- Continue to hold Eliquis x 2 weeks -- Continue PT/OT efforts -- Fall precautions -- Staples for scalp laceration will need removal in 7-10 days (placed 9/17)  Persistent atrial fibrillation with sinus pauses Seen by cardiology, appear asymptomatic.  Echocardiogram with LVEF 65 diastolic percent, normal RV function, mild biatrial enlargement, mild AI, dilated aortic root measuring 43 mm.  No indication for permanent pacemaker placement. -- Diltiazem decreased to 120 mg p.o. daily -- Continue monitor on telemetry  Essential hypertension -- Diltiazem 120 mg p.o. daily -- Irbesartan 300 mg p.o. daily  Vertigo -- Continue PT  Type 2 diabetes mellitus Hemoglobin A1c 6.7. -- SSI for coverage -- CBGs qAC/HS  Moderate protein calorie malnutrition Body mass index is 27.98 kg/m. Nutrition Status: Nutrition Problem: Moderate Malnutrition Etiology: social / environmental circumstances Signs/Symptoms: mild fat depletion, mild muscle depletion Interventions:  ALKPHOS 78  BILITOT 1.5*  PROT 6.2*  ALBUMIN 3.3*   No results for input(s): "LIPASE", "AMYLASE" in the last 168 hours. No results for input(s): "AMMONIA" in the last 168 hours. Coagulation Profile: No results for input(s): "INR", "PROTIME" in the last 168 hours. Cardiac Enzymes: No results for input(s): "CKTOTAL", "CKMB", "CKMBINDEX", "TROPONINI" in the last 168 hours. BNP (last 3 results) No results for input(s): "PROBNP" in the last 8760 hours. HbA1C: No results for input(s): "HGBA1C" in the last 72 hours. CBG: Recent Labs  Lab  09/16/23 1235 09/16/23 1602 09/16/23 2113 09/17/23 0628 09/17/23 1100  GLUCAP 134* 163* 125* 122* 235*   Lipid Profile: No results for input(s): "CHOL", "HDL", "LDLCALC", "TRIG", "CHOLHDL", "LDLDIRECT" in the last 72 hours. Thyroid Function Tests: Recent Labs    09/15/23 0559  FREET4 1.07   Anemia Panel: No results for input(s): "VITAMINB12", "FOLATE", "FERRITIN", "TIBC", "IRON", "RETICCTPCT" in the last 72 hours. Sepsis Labs: No results for input(s): "PROCALCITON", "LATICACIDVEN" in the last 168 hours.  No results found for this or any previous visit (from the past 240 hour(s)).       Radiology Studies: CT HEAD WO CONTRAST ( )  Result Date: 09/16/2023 CLINICAL DATA:  Subdural hematoma Worsening mental status; follow-up stability of previous SDH EXAM: CT HEAD WITHOUT CONTRAST TECHNIQUE: Contiguous axial images were obtained from the base of the skull through the vertex without intravenous contrast. RADIATION DOSE REDUCTION: This exam was performed according to the departmental dose-optimization program which includes automated exposure control, adjustment of the mA and/or kV according to patient size and/or use of iterative reconstruction technique. COMPARISON:  CT head 09/09/2023. FINDINGS: Motion limited study.  Within this limitation: Brain: Similar 3 mm subdural hemorrhage along the falx. Similar size and decreased density of a 6 mm hemorrhagic contusion in the posterior left frontal lobe at the vertex. No evidence of new/interval acute hemorrhage, mass lesion, midline shift or hydrocephalus. 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 Calcific atherosclerosis. Skull: No acute fracture. Sinuses/Orbits: Clear sinuses.  No acute orbital findings. Other: No mastoid effusions. IMPRESSION: 1. Similar 3 mm subdural hemorrhage along the falx. 2. Similar size and decreased density of a 6 mm hemorrhagic contusion in the posterior left frontal lobe at the vertex. 3.  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 Electronically Signed   By: Feliberto Harts M.D.   On: 09/16/2023 15:49   ECHOCARDIOGRAM COMPLETE  Result Date: 09/15/2023    ECHOCARDIOGRAM REPORT   Patient Name:   TAIWAN MADDOCKS Date of Exam: 09/15/2023 Medical Rec #:  [id number]     Height:       70.0 in Accession #:    [id number]    Weight:       195.0 lb Date of Birth:  [birth year]      BSA:          2.065 m Patient Age:    87 years      BP:           153/77 mmHg Patient Gender: M             HR:           81 bpm. Exam Location:  Inpatient Procedure: 2D Echo, Cardiac Doppler and Color Doppler Indications:    Abnormal ECG R94.31  History:        Patient has no prior history of Echocardiogram examinations.                 Arrythmias:Atrial Fibrillation; Risk  PROGRESS NOTE    KASTEN UEHLING  WUJ:[id number] DOB: [birth year] DOA: 09/08/2023 PCP: [name], PA-C    Brief Narrative:   Mathew Simpson is a 87 y.o. male with past medical history significant for persistent atrial fibrillation on Eliquis, HTN, type 2 diabetes mellitus, vertigo who presented to [facility] ED on 9/17 via EMS from home after mechanical fall at home.  Family believes he may have fallen backwards from the top of the stairs with suspected loss of consciousness.  Found by neighbor.  EMS reports since fall he was having dizziness, nausea, vomiting and saying repetitive questions.  Complicated by Eliquis use, took the medication earlier in the morning.  In the ED, temperature 97.5 F, HR 68, RR 18, BP 149/93, SpO2 94% on room air.  WBC 6.0, hemoglobin 12.7, platelet count 151.  Sodium 138, potassium 4.0, chloride 102, CO2 27, glucose 127, BUN 19, creat 1.30.  AST 24, ALT 19, total bilirubin 1.3.  EtOH level less than 10.  Procalcitonin less than 0.10.  CK 75.  Chest x-ray with low lung volumes, prominent interstitial markings, no acute findings.  Pelvis x-ray negative.  CT head and C-spine without contrast with 3 mm subdural hematoma anterior falx, no midline shift, 6 mm hemorrhagic contusion posterior left frontal lobe, asymmetric widening of the space between the dens and left lateral mass of the C1 relative the right lateral mass, suspicious for worry as some fixation.  Patient was seen by neurosurgery, follow-up CT scans demonstrate stable appearance small anterior falcine subdural and left frontal contusion does not require any acute intervention; Dr. Conchita Paris; with recommendation of holding Eliquis for 2 weeks, no requirement of cervical immobilization and outpatient follow-up in neurosurg clinic as needed.  TRH consulted for admission.  Assessment & Plan:   Subdural hematoma Traumatic brain injury Left frontal contusion  Scalp laceration Mechanical fall Patient presenting via EMS  after a mechanical fall at home striking his head with suspected loss of consciousness.  CT head and C-spine without contrast on admission  with 3 mm subdural hematoma anterior falx, no midline shift, 6 mm hemorrhagic contusion posterior left frontal lobe, asymmetric widening of the space between the dens and left lateral mass of the C1 relative the right lateral mass, suspicious for worry as some fixation.  Patient was seen by neurosurgery, follow-up CT scans demonstrate stable appearance small anterior falcine subdural and left frontal contusion does not require any acute intervention; Dr. Conchita Paris; with recommendation of holding Eliquis for 2 weeks, no requirement of cervical immobilization and outpatient follow-up in neurosurg clinic as needed.  -- Repeat CT head without contrast 9/25 w/ stable SDH -- Continue to hold Eliquis x 2 weeks -- Continue PT/OT efforts -- Fall precautions -- Staples for scalp laceration will need removal in 7-10 days (placed 9/17)  Persistent atrial fibrillation with sinus pauses Seen by cardiology, appear asymptomatic.  Echocardiogram with LVEF 65 diastolic percent, normal RV function, mild biatrial enlargement, mild AI, dilated aortic root measuring 43 mm.  No indication for permanent pacemaker placement. -- Diltiazem decreased to 120 mg p.o. daily -- Continue monitor on telemetry  Essential hypertension -- Diltiazem 120 mg p.o. daily -- Irbesartan 300 mg p.o. daily  Vertigo -- Continue PT  Type 2 diabetes mellitus Hemoglobin A1c 6.7. -- SSI for coverage -- CBGs qAC/HS  Moderate protein calorie malnutrition Body mass index is 27.98 kg/m. Nutrition Status: Nutrition Problem: Moderate Malnutrition Etiology: social / environmental circumstances Signs/Symptoms: mild fat depletion, mild muscle depletion Interventions:  PROGRESS NOTE    KASTEN UEHLING  WUJ:[id number] DOB: [birth year] DOA: 09/08/2023 PCP: [name], PA-C    Brief Narrative:   Mathew Simpson is a 87 y.o. male with past medical history significant for persistent atrial fibrillation on Eliquis, HTN, type 2 diabetes mellitus, vertigo who presented to [facility] ED on 9/17 via EMS from home after mechanical fall at home.  Family believes he may have fallen backwards from the top of the stairs with suspected loss of consciousness.  Found by neighbor.  EMS reports since fall he was having dizziness, nausea, vomiting and saying repetitive questions.  Complicated by Eliquis use, took the medication earlier in the morning.  In the ED, temperature 97.5 F, HR 68, RR 18, BP 149/93, SpO2 94% on room air.  WBC 6.0, hemoglobin 12.7, platelet count 151.  Sodium 138, potassium 4.0, chloride 102, CO2 27, glucose 127, BUN 19, creat 1.30.  AST 24, ALT 19, total bilirubin 1.3.  EtOH level less than 10.  Procalcitonin less than 0.10.  CK 75.  Chest x-ray with low lung volumes, prominent interstitial markings, no acute findings.  Pelvis x-ray negative.  CT head and C-spine without contrast with 3 mm subdural hematoma anterior falx, no midline shift, 6 mm hemorrhagic contusion posterior left frontal lobe, asymmetric widening of the space between the dens and left lateral mass of the C1 relative the right lateral mass, suspicious for worry as some fixation.  Patient was seen by neurosurgery, follow-up CT scans demonstrate stable appearance small anterior falcine subdural and left frontal contusion does not require any acute intervention; Dr. Conchita Paris; with recommendation of holding Eliquis for 2 weeks, no requirement of cervical immobilization and outpatient follow-up in neurosurg clinic as needed.  TRH consulted for admission.  Assessment & Plan:   Subdural hematoma Traumatic brain injury Left frontal contusion  Scalp laceration Mechanical fall Patient presenting via EMS  after a mechanical fall at home striking his head with suspected loss of consciousness.  CT head and C-spine without contrast on admission  with 3 mm subdural hematoma anterior falx, no midline shift, 6 mm hemorrhagic contusion posterior left frontal lobe, asymmetric widening of the space between the dens and left lateral mass of the C1 relative the right lateral mass, suspicious for worry as some fixation.  Patient was seen by neurosurgery, follow-up CT scans demonstrate stable appearance small anterior falcine subdural and left frontal contusion does not require any acute intervention; Dr. Conchita Paris; with recommendation of holding Eliquis for 2 weeks, no requirement of cervical immobilization and outpatient follow-up in neurosurg clinic as needed.  -- Repeat CT head without contrast 9/25 w/ stable SDH -- Continue to hold Eliquis x 2 weeks -- Continue PT/OT efforts -- Fall precautions -- Staples for scalp laceration will need removal in 7-10 days (placed 9/17)  Persistent atrial fibrillation with sinus pauses Seen by cardiology, appear asymptomatic.  Echocardiogram with LVEF 65 diastolic percent, normal RV function, mild biatrial enlargement, mild AI, dilated aortic root measuring 43 mm.  No indication for permanent pacemaker placement. -- Diltiazem decreased to 120 mg p.o. daily -- Continue monitor on telemetry  Essential hypertension -- Diltiazem 120 mg p.o. daily -- Irbesartan 300 mg p.o. daily  Vertigo -- Continue PT  Type 2 diabetes mellitus Hemoglobin A1c 6.7. -- SSI for coverage -- CBGs qAC/HS  Moderate protein calorie malnutrition Body mass index is 27.98 kg/m. Nutrition Status: Nutrition Problem: Moderate Malnutrition Etiology: social / environmental circumstances Signs/Symptoms: mild fat depletion, mild muscle depletion Interventions:  PROGRESS NOTE    KASTEN UEHLING  WUJ:[id number] DOB: [birth year] DOA: 09/08/2023 PCP: [name], PA-C    Brief Narrative:   Mathew Simpson is a 87 y.o. male with past medical history significant for persistent atrial fibrillation on Eliquis, HTN, type 2 diabetes mellitus, vertigo who presented to [facility] ED on 9/17 via EMS from home after mechanical fall at home.  Family believes he may have fallen backwards from the top of the stairs with suspected loss of consciousness.  Found by neighbor.  EMS reports since fall he was having dizziness, nausea, vomiting and saying repetitive questions.  Complicated by Eliquis use, took the medication earlier in the morning.  In the ED, temperature 97.5 F, HR 68, RR 18, BP 149/93, SpO2 94% on room air.  WBC 6.0, hemoglobin 12.7, platelet count 151.  Sodium 138, potassium 4.0, chloride 102, CO2 27, glucose 127, BUN 19, creat 1.30.  AST 24, ALT 19, total bilirubin 1.3.  EtOH level less than 10.  Procalcitonin less than 0.10.  CK 75.  Chest x-ray with low lung volumes, prominent interstitial markings, no acute findings.  Pelvis x-ray negative.  CT head and C-spine without contrast with 3 mm subdural hematoma anterior falx, no midline shift, 6 mm hemorrhagic contusion posterior left frontal lobe, asymmetric widening of the space between the dens and left lateral mass of the C1 relative the right lateral mass, suspicious for worry as some fixation.  Patient was seen by neurosurgery, follow-up CT scans demonstrate stable appearance small anterior falcine subdural and left frontal contusion does not require any acute intervention; Dr. Conchita Paris; with recommendation of holding Eliquis for 2 weeks, no requirement of cervical immobilization and outpatient follow-up in neurosurg clinic as needed.  TRH consulted for admission.  Assessment & Plan:   Subdural hematoma Traumatic brain injury Left frontal contusion  Scalp laceration Mechanical fall Patient presenting via EMS  after a mechanical fall at home striking his head with suspected loss of consciousness.  CT head and C-spine without contrast on admission  with 3 mm subdural hematoma anterior falx, no midline shift, 6 mm hemorrhagic contusion posterior left frontal lobe, asymmetric widening of the space between the dens and left lateral mass of the C1 relative the right lateral mass, suspicious for worry as some fixation.  Patient was seen by neurosurgery, follow-up CT scans demonstrate stable appearance small anterior falcine subdural and left frontal contusion does not require any acute intervention; Dr. Conchita Paris; with recommendation of holding Eliquis for 2 weeks, no requirement of cervical immobilization and outpatient follow-up in neurosurg clinic as needed.  -- Repeat CT head without contrast 9/25 w/ stable SDH -- Continue to hold Eliquis x 2 weeks -- Continue PT/OT efforts -- Fall precautions -- Staples for scalp laceration will need removal in 7-10 days (placed 9/17)  Persistent atrial fibrillation with sinus pauses Seen by cardiology, appear asymptomatic.  Echocardiogram with LVEF 65 diastolic percent, normal RV function, mild biatrial enlargement, mild AI, dilated aortic root measuring 43 mm.  No indication for permanent pacemaker placement. -- Diltiazem decreased to 120 mg p.o. daily -- Continue monitor on telemetry  Essential hypertension -- Diltiazem 120 mg p.o. daily -- Irbesartan 300 mg p.o. daily  Vertigo -- Continue PT  Type 2 diabetes mellitus Hemoglobin A1c 6.7. -- SSI for coverage -- CBGs qAC/HS  Moderate protein calorie malnutrition Body mass index is 27.98 kg/m. Nutrition Status: Nutrition Problem: Moderate Malnutrition Etiology: social / environmental circumstances Signs/Symptoms: mild fat depletion, mild muscle depletion Interventions:

## 2023-09-17 NOTE — Progress Notes (Signed)
Telemetry reviewed.  Remains in rate controlled Afib.  Had one episode of short pause (2.3 seconds) overnight, no concerning arrhythmias.  Will continue to monitor  Little Ishikawa, MD

## 2023-09-18 DIAGNOSIS — S065XAA Traumatic subdural hemorrhage with loss of consciousness status unknown, initial encounter: Secondary | ICD-10-CM | POA: Diagnosis not present

## 2023-09-18 DIAGNOSIS — I4819 Other persistent atrial fibrillation: Secondary | ICD-10-CM | POA: Diagnosis not present

## 2023-09-18 LAB — GLUCOSE, CAPILLARY
Glucose-Capillary: 146 mg/dL — ABNORMAL HIGH (ref 70–99)
Glucose-Capillary: 127 mg/dL — ABNORMAL HIGH (ref 70–99)
Glucose-Capillary: 114 mg/dL — ABNORMAL HIGH (ref 70–99)
Glucose-Capillary: 189 mg/dL — ABNORMAL HIGH (ref 70–99)
Glucose-Capillary: 128 mg/dL — ABNORMAL HIGH (ref 70–99)

## 2023-09-18 MED ORDER — MELATONIN 3 MG PO TABS
6.0000 mg | ORAL_TABLET | Freq: Every day | ORAL | Status: DC
  Administered 2023-09-18 – 2023-09-30 (×13): 6 mg via ORAL
  Filled 2023-09-18 (×13): qty 2

## 2023-09-18 MED ORDER — BOOST / RESOURCE BREEZE PO LIQD CUSTOM
1.0000 | Freq: Three times a day (TID) | ORAL | Status: DC
  Administered 2023-09-18 – 2023-10-01 (×25): 1 via ORAL

## 2023-09-18 NOTE — Progress Notes (Signed)
Pt. Noted to be more confused. Notified Dr. [place]. Per MD, no further interventions required at this time. Will continue to monitor.

## 2023-09-18 NOTE — TOC Progression Note (Signed)
Transition of Care [facility]) - Progression Note    Patient Details  Name: Mathew Simpson MRN: [id number] Date of Birth: [birth year]  [facility] CM/SW Contact  Mathew Simpson, [place] Phone Number: 09/18/2023, 8:58 PM  Clinical Narrative:   CSW received call from [facility] that CSW will need to send in an appointment of representative form to represent the patient for the appeal process. CSW met with patient to sign form, and patient appears more confused than prior discussions, did not seem to understand what was being discussed and waved CSW off. CSW contacted granddaughter, Mathew Simpson, to update on form being signed but the patient did not appear to understand. Mathew Simpson said she also came to see the patient today and he did not look well. CSW updated MD. CSW faxed in AOR form to [facility] for appeal process, decision has not yet been made on the appeal.    Expected Discharge Plan: Skilled Nursing Facility Barriers to Discharge: Continued Medical Work up, English as a second language teacher  Expected Discharge Plan and Services     Post Acute Care Choice: Skilled Nursing Facility Living arrangements for the past 2 months: Single Family Home                                       Social Determinants of Health (SDOH) Interventions SDOH Screenings   Food Insecurity: No Food Insecurity (09/10/2023)  Housing: Low Risk  (09/10/2023)  Transportation Needs: No Transportation Needs (09/10/2023)  Utilities: Not At Risk (09/10/2023)  Tobacco Use: Medium Risk (09/08/2023)    Readmission Risk Interventions    07/23/2022   11:40 AM 07/21/2022   10:11 AM  Readmission Risk Prevention Plan  Post Dischage Appt Complete Complete  Medication Screening Complete Complete  Transportation Screening Complete Complete

## 2023-09-18 NOTE — Progress Notes (Signed)
Rounding Note    Patient Name: Mathew Simpson Date of Encounter: 09/18/2023  [facility] Cardiologist: None   Subjective   More alert today, oriented x2.  Denies any chest pain or dyspnea  Inpatient Medications    Scheduled Meds:  diltiazem  120 mg Oral Daily   enoxaparin (LOVENOX) injection  40 mg Subcutaneous Q24H   feeding supplement  237 mL Oral BID BM   insulin aspart  0-5 Units Subcutaneous QHS   insulin aspart  0-9 Units Subcutaneous TID WC   irbesartan  300 mg Oral Daily   melatonin  3 mg Oral QHS   metoprolol tartrate  12.5 mg Oral BID   multivitamin with minerals  1 tablet Oral Daily   polyethylene glycol  17 g Oral BID   Continuous Infusions:  PRN Meds: acetaminophen **OR** acetaminophen, bisacodyl, HYDROcodone-acetaminophen, labetalol, ondansetron **OR** ondansetron (ZOFRAN) IV, mouth rinse   Vital Signs    Vitals:   09/17/23 1528 09/17/23 2039 09/17/23 2357 09/18/23 0425  BP: (!) 144/71 (!) 145/95 (!) 140/88 (!) 143/87  Pulse: 83 87 88 85  Resp: 18     Temp: 98.8 F (37.1 C) 98.6 F (37 C) 98.8 F (37.1 C) 98.6 F (37 C)  TempSrc: Oral Oral Oral Oral  SpO2: 95% 97% 93% 95%  Weight:      Height:        Intake/Output Summary (Last 24 hours) at 09/18/2023 0846 Last data filed at 09/18/2023 0428 Gross per 24 hour  Intake --  Output 1000 ml  Net -1000 ml      09/08/2023    4:01 PM 07/17/2022   11:55 AM 03/12/2020   10:22 AM  Last 3 Weights  Weight (lbs) 195 lb 190 lb 186 lb 15.2 oz  Weight (kg) 88.451 kg 86.183 kg 84.8 kg      Telemetry    Afib, rate controlled, no pauses - Personally Reviewed  ECG    No new ecg - Personally Reviewed  Physical Exam   GEN: Somnolent but arousable Neck: No JVD Cardiac: Irregular, normal rate, 2-6 systolic murmur Respiratory: Clear to auscultation bilaterally. GI: Soft, nontender, non-distended  MS: No edema; No deformity. Neuro: Oriented to person only Psych: Unable to assess  Labs     High Sensitivity Troponin:  No results for input(s): "TROPONINIHS" in the last 720 hours.   Chemistry Recent Labs  Lab 09/13/23 0126 09/15/23 0559 09/17/23 0658  NA 135 136 136  K 3.7 4.0 3.6  CL 98 102 105  CO2 26 23 22   GLUCOSE 125* 196* 143*  BUN 19 25* 21  CREATININE 1.06 1.21 1.14  CALCIUM 8.7* 8.8* 8.8*  MG 2.1 2.1 2.1  PROT  --   --  6.2*  ALBUMIN  --   --  3.3*  AST  --   --  24  ALT  --   --  28  ALKPHOS  --   --  78  BILITOT  --   --  1.5*  GFRNONAA >60 59* >60  ANIONGAP 11 11 9     Lipids No results for input(s): "CHOL", "TRIG", "HDL", "LABVLDL", "LDLCALC", "CHOLHDL" in the last 168 hours.  Hematology Recent Labs  Lab 09/17/23 0658  WBC 6.8  RBC 4.13*  HGB 13.2  HCT 37.3*  MCV 90.3  MCH 32.0  MCHC 35.4  RDW 13.7  PLT 166   Thyroid  Recent Labs  Lab 09/15/23 0559  FREET4 1.07    BNPNo results  for input(s): "BNP", "PROBNP" in the last 168 hours.  DDimer No results for input(s): "DDIMER" in the last 168 hours.   Radiology    CT HEAD WO CONTRAST ( )  Result Date: 09/16/2023 CLINICAL DATA:  Subdural hematoma Worsening mental status; follow-up stability of previous SDH EXAM: CT HEAD WITHOUT CONTRAST TECHNIQUE: Contiguous axial images were obtained from the base of the skull through the vertex without intravenous contrast. RADIATION DOSE REDUCTION: This exam was performed according to the departmental dose-optimization program which includes automated exposure control, adjustment of the mA and/or kV according to patient size and/or use of iterative reconstruction technique. COMPARISON:  CT head 09/09/2023. FINDINGS: Motion limited study.  Within this limitation: Brain: Similar 3 mm subdural hemorrhage along the falx. Similar size and decreased density of a 6 mm hemorrhagic contusion in the posterior left frontal lobe at the vertex. No evidence of new/interval acute hemorrhage, mass lesion, midline shift or hydrocephalus. 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 Calcific atherosclerosis. Skull: No acute fracture. Sinuses/Orbits: Clear sinuses.  No acute orbital findings. Other: No mastoid effusions. IMPRESSION: 1. Similar 3 mm subdural hemorrhage along the falx. 2. Similar size and decreased density of a 6 mm hemorrhagic contusion in the posterior left frontal lobe at the vertex. 3. 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 Electronically Signed   By: Feliberto Harts M.D.   On: 09/16/2023 15:49    Cardiac Studies     Patient Profile     87 y.o. male with a hx of A-fib on Eliquis, HTN, T2DM, vertigo who is being seen 09/15/2023 for the evaluation of pauses   Assessment & Plan    Longstanding persistent atrial fibrillation with controlled ventricular rates - Short pauses that are asymptomatic in the setting of diltiazem use and overnight likely consistent with vagally mediated AV node delay. Decreased diltiazem to 120 mg daily and dosing in the morning given highest risk for pauses is in the evening during sleep.  Given contraindication to anticoagulation, the patient is not a candidate for rhythm control at this time.  Based on ECG review it appears he has been in atrial fibrillation since 2020 and successful rhythm control would likely be challenging.  He does not currently have an indication for permanent pacemaker. Echo shows EF 65-70%, normal RV function, mild biatrial enlargement, mild AI, dilated aortic root measuring 43.Marland Kitchen  Hypertension - continue ARB  SDH: neurosurgery recommend hold eliquis x 2 weeks.  [facility] will sign off.   Medication Recommendations:  Diltiazem 120 mg daily.  Holding Eliquis until OK to restart per neurosurgery Other recommendations (labs, testing, etc):  Zio x 2 weeks Follow up as an outpatient:  Will schedule   For questions or updates, please contact [facility] Please consult [email or web address] for contact info under        Signed, Little Ishikawa, MD  09/18/2023, 8:46 AM

## 2023-09-18 NOTE — Progress Notes (Signed)
PROGRESS NOTE    Mathew Simpson  NWG:[id number] DOB: 05-08-1936 DOA: 09/08/2023 PCP: [name], PA-C    Brief Narrative:   Mathew Simpson is a 87 y.o. male with past medical history significant for persistent atrial fibrillation on Eliquis, HTN, type 2 diabetes mellitus, vertigo who presented to [facility] ED on 9/17 via EMS from home after mechanical fall at home.  Family believes he may have fallen backwards from the top of the stairs with suspected loss of consciousness.  Found by neighbor.  EMS reports since fall he was having dizziness, nausea, vomiting and saying repetitive questions.  Complicated by Eliquis use, took the medication earlier in the morning.  In the ED, temperature 97.5 F, HR 68, RR 18, BP 149/93, SpO2 94% on room air.  WBC 6.0, hemoglobin 12.7, platelet count 151.  Sodium 138, potassium 4.0, chloride 102, CO2 27, glucose 127, BUN 19, creat 1.30.  AST 24, ALT 19, total bilirubin 1.3.  EtOH level less than 10.  Procalcitonin less than 0.10.  CK 75.  Chest x-ray with low lung volumes, prominent interstitial markings, no acute findings.  Pelvis x-ray negative.  CT head and C-spine without contrast with 3 mm subdural hematoma anterior falx, no midline shift, 6 mm hemorrhagic contusion posterior left frontal lobe, asymmetric widening of the space between the dens and left lateral mass of the C1 relative the right lateral mass, suspicious for worry as some fixation.  Patient was seen by neurosurgery, follow-up CT scans demonstrate stable appearance small anterior falcine subdural and left frontal contusion does not require any acute intervention; Dr. Conchita Paris; with recommendation of holding Eliquis for 2 weeks, no requirement of cervical immobilization and outpatient follow-up in neurosurg clinic as needed.  TRH consulted for admission.  Assessment & Plan:   Subdural hematoma Traumatic brain injury Left frontal contusion  Scalp laceration Mechanical fall Patient presenting via EMS  after a mechanical fall at home striking his head with suspected loss of consciousness.  CT head and C-spine without contrast on admission  with 3 mm subdural hematoma anterior falx, no midline shift, 6 mm hemorrhagic contusion posterior left frontal lobe, asymmetric widening of the space between the dens and left lateral mass of the C1 relative the right lateral mass, suspicious for worry as some fixation.  Patient was seen by neurosurgery, follow-up CT scans demonstrate stable appearance small anterior falcine subdural and left frontal contusion does not require any acute intervention; Dr. Conchita Paris; with recommendation of holding Eliquis for 2 weeks, no requirement of cervical immobilization and outpatient follow-up in neurosurg clinic as needed.  -- Repeat CT head without contrast 9/25 w/ stable SDH -- Continue to hold Eliquis x 2 weeks -- Continue PT/OT efforts -- Fall precautions -- Staples removed today  Persistent atrial fibrillation with sinus pauses Seen by cardiology, appear asymptomatic.  Echocardiogram with LVEF 65 diastolic percent, normal RV function, mild biatrial enlargement, mild AI, dilated aortic root measuring 43 mm.  No indication for permanent pacemaker placement. -- Diltiazem decreased to 120 mg p.o. daily -- Continue monitor on telemetry -- Cardiology signed off, recommend Zio patch x 2 weeks  Essential hypertension -- Diltiazem 120 mg p.o. daily -- Irbesartan 300 mg p.o. daily  Vertigo -- Continue PT  Type 2 diabetes mellitus Hemoglobin A1c 6.7. -- SSI for coverage -- CBGs qAC/HS  Moderate protein calorie malnutrition Body mass index is 27.98 kg/m. Nutrition Status: Nutrition Problem: Moderate Malnutrition Etiology: social / environmental circumstances Signs/Symptoms: mild fat depletion, mild muscle depletion  Interventions: Ensure Enlive (each supplement provides 350kcal and 20 grams of protein), MVI -- Seen by dietitian, continue encourage increased oral  intake, supplementation  Hospital-acquired delirium -- Delirium precautions -- Get up during the day -- Encourage a familiar face to remain present throughout the day -- Keep blinds open and lights on during daylight hours -- Minimize the use of opioids/benzodiazepines -- melatonin 3mg  PO qhs  Weakness/debility/deconditioning/gait disturbance: PT/OT initially recommending SNF placement, denied by insurance carrier following peer to peer. --TOC following, family now appealing insurance decision/denial    DVT prophylaxis: enoxaparin (LOVENOX) injection 40 mg Start: 09/10/23 2200 SCDs Start: 09/08/23 2101    Code Status: Full Code Family Communication: No family present at bedside this morning  Disposition Plan:  Level of care: Med-Surg Status is: Observation The patient remains OBS appropriate and will d/c before 2 midnights.    Consultants:  Neurosurgery, Dr. Conchita Paris Cardiology  Procedures:  TTE  Antimicrobials:  None   Subjective: Patient seen examined bedside, resting calmly.  Lying in bed.  Eating breakfast.  No specific complaints this morning.  Removed 5 staples from his posterior scalp today.  Still awaiting appeal by family for insurance denial for SNF.  Remains medically ready for discharge once bed available and received approval.  Discussed with cardiology this morning, recommend Zio patch x 2 weeks on discharge.  No other specific questions or concerns at this time.  No family present bedside.  Denies headache, no fever/chills/night sweats, no nausea/vomiting/diarrhea, no chest pain, no palpitations, no shortness of breath, no abdominal pain, no focal weakness, no fatigue, no paresthesia.  No acute events overnight per nurse staff.  Objective: Vitals:   09/17/23 2039 09/17/23 2357 09/18/23 0425 09/18/23 0928  BP: (!) 145/95 (!) 140/88 (!) 143/87 (!) 135/90  Pulse: 87 88 85   Resp:    18  Temp: 98.6 F (37 C) 98.8 F (37.1 C) 98.6 F (37 C) 98 F (36.7  C)  TempSrc: Oral Oral Oral Oral  SpO2: 97% 93% 95% 96%  Weight:      Height:        Intake/Output Summary (Last 24 hours) at 09/18/2023 1130 Last data filed at 09/18/2023 1324 Gross per 24 hour  Intake --  Output 1000 ml  Net -1000 ml   Filed Weights   09/08/23 1601  Weight: 88.5 kg    Examination:  Physical Exam: GEN: NAD, alert and oriented to [facility]), person [employer]), but not time (2023), chronically ill in appearance, appears older than stated age HEENT: PERRL, sclera clear, MMM, noted posterior scalp laceration with staples in place which were removed this morning PULM: CTAB w/o wheezes/crackles, normal respiratory effort, on room air CV: IIR, normal rate, w/o M/G/R GI: abd soft, NTND, NABS MSK: no peripheral edema, moves all extremity independently NEURO: No focal neurological deficits Integumentary: Scalp laceration above, otherwise no concerning rashes/lesions/wounds noted on exposed skin surfaces.    Data Reviewed: I have personally reviewed following labs and imaging studies  CBC: Recent Labs  Lab 09/17/23 0658  WBC 6.8  HGB 13.2  HCT 37.3*  MCV 90.3  PLT 166   Basic Metabolic Panel: Recent Labs  Lab 09/13/23 0126 09/15/23 0559 09/17/23 0658  NA 135 136 136  K 3.7 4.0 3.6  CL 98 102 105  CO2 26 23 22   GLUCOSE 125* 196* 143*  BUN 19 25* 21  CREATININE 1.06 1.21 1.14  CALCIUM 8.7* 8.8* 8.8*  MG 2.1 2.1 2.1  PHOS 3.2  --  3.6   GFR: Estimated Creatinine Clearance: 53.1 mL/min (by C-G formula based on SCr of 1.14 mg/dL). Liver Function Tests: Recent Labs  Lab 09/17/23 0658  AST 24  ALT 28  ALKPHOS 78  BILITOT 1.5*  PROT 6.2*  ALBUMIN 3.3*   No results for input(s): "LIPASE", "AMYLASE" in the last 168 hours. No results for input(s): "AMMONIA" in the last 168 hours. Coagulation Profile: No results for input(s): "INR", "PROTIME" in the last 168 hours. Cardiac Enzymes: No results for input(s): "CKTOTAL", "CKMB",  "CKMBINDEX", "TROPONINI" in the last 168 hours. BNP (last 3 results) No results for input(s): "PROBNP" in the last 8760 hours. HbA1C: No results for input(s): "HGBA1C" in the last 72 hours. CBG: Recent Labs  Lab 09/17/23 1100 09/17/23 1628 09/17/23 2055 09/18/23 0612 09/18/23 0810  GLUCAP 235* 116* 159* 146* 127*   Lipid Profile: No results for input(s): "CHOL", "HDL", "LDLCALC", "TRIG", "CHOLHDL", "LDLDIRECT" in the last 72 hours. Thyroid Function Tests: No results for input(s): "TSH", "T4TOTAL", "FREET4", "T3FREE", "THYROIDAB" in the last 72 hours.  Anemia Panel: No results for input(s): "VITAMINB12", "FOLATE", "FERRITIN", "TIBC", "IRON", "RETICCTPCT" in the last 72 hours. Sepsis Labs: No results for input(s): "PROCALCITON", "LATICACIDVEN" in the last 168 hours.  No results found for this or any previous visit (from the past 240 hour(s)).       Radiology Studies: CT HEAD WO CONTRAST ( )  Result Date: 09/16/2023 CLINICAL DATA:  Subdural hematoma Worsening mental status; follow-up stability of previous SDH EXAM: CT HEAD WITHOUT CONTRAST TECHNIQUE: Contiguous axial images were obtained from the base of the skull through the vertex without intravenous contrast. RADIATION DOSE REDUCTION: This exam was performed according to the departmental dose-optimization program which includes automated exposure control, adjustment of the mA and/or kV according to patient size and/or use of iterative reconstruction technique. COMPARISON:  CT head 09/09/2023. FINDINGS: Motion limited study.  Within this limitation: Brain: Similar 3 mm subdural hemorrhage along the falx. Similar size and decreased density of a 6 mm hemorrhagic contusion in the posterior left frontal lobe at the vertex. No evidence of new/interval acute hemorrhage, mass lesion, midline shift or hydrocephalus. 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 Calcific atherosclerosis. Skull: No acute fracture.  Sinuses/Orbits: Clear sinuses.  No acute orbital findings. Other: No mastoid effusions. IMPRESSION: 1. Similar 3 mm subdural hemorrhage along the falx. 2. Similar size and decreased density of a 6 mm hemorrhagic contusion in the posterior left frontal lobe at the vertex. 3. 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 Electronically Signed   By: Feliberto Harts M.D.   On: 09/16/2023 15:49        Scheduled Meds:  diltiazem  120 mg Oral Daily   enoxaparin (LOVENOX) injection  40 mg Subcutaneous Q24H   feeding supplement  237 mL Oral BID BM   insulin aspart  0-5 Units Subcutaneous QHS   insulin aspart  0-9 Units Subcutaneous TID WC   irbesartan  300 mg Oral Daily   melatonin  3 mg Oral QHS   metoprolol tartrate  12.5 mg Oral BID   multivitamin with minerals  1 tablet Oral Daily   polyethylene glycol  17 g Oral BID   Continuous Infusions:   LOS: 1 day    Time spent: 52 minutes spent on chart review, discussion with nursing staff, consultants, updating family and interview/physical exam; more than 50% of that time was spent in counseling and/or coordination of care.    Alvira Philips [place], DO [facility] Available via [employer]  secure chat 7am-7pm After these hours, please refer to coverage provider listed on [email or web address] 09/18/2023, 11:30 AM

## 2023-09-18 NOTE — Progress Notes (Signed)
Nutrition Follow-up  DOCUMENTATION CODES:  Non-severe (moderate) malnutrition in context of social or environmental circumstances  INTERVENTION:  Continue current diet as ordered, adjust to ordering assistance Change Ensure to Boost Breeze po TID, each supplement provides 250 kcal and 9 grams of protein Magic cup TID with meals, each supplement provides 290 kcal and 9 grams of protein  New weight requested Multivitamin with minerals daily  NUTRITION DIAGNOSIS:   Moderate Malnutrition related to social / environmental circumstances as evidenced by mild fat depletion, mild muscle depletion. - remains applicable  GOAL:  Patient will meet greater than or equal to 90% of their needs - progressing, diet and supplements in place  MONITOR:  PO intake, Supplement acceptance, Labs, Weight trends  REASON FOR ASSESSMENT:  Consult Other (Comment) (nutrition goals)  ASSESSMENT:  Pt admitted after a mechanical fall leading to SDH. PMH significant for afib on eliquis, HTN.  Pt resting in bed at the time of assessment. Breakfast at bedside partially consumed and lunch tray near pt's bed untouched. PT is currently eating some fruit. Ensures present on both trays minimally consumed. Difficult to extract information from patient as he answers a lot of questions with "I don't know"  Pt does indicate he does not like the ensure. States that he might like boost breeze better. Does think he would like magic cup. Will adjust supplements. Will also ask for ordering assistance versus automatic trays to ensure that pt gets food he enjoys.   Admit / Current weight: 88.5 kg ( no new wt since admission) Pt reported a UBW of 195 lb  Average Meal Intake: 9/20-9/22: 85% intake x 5 recorded meals  Nutritionally Relevant Medications: Scheduled Meds:  Ensure Plus High Protein  237 mL Oral BID BM   insulin aspart  0-5 Units Subcutaneous QHS   insulin aspart  0-9 Units Subcutaneous TID WC   multivitamin with  minerals  1 tablet Oral Daily   polyethylene glycol  17 g Oral BID   PRN Meds: bisacodyl, ondansetron   Labs Reviewed: CBG ranges from 116-235 mg/dL over the last 24 hours  NUTRITION - FOCUSED PHYSICAL EXAM: Flowsheet Row Most Recent Value  Orbital Region Mild depletion  Upper Arm Region Mild depletion  Thoracic and Lumbar Region No depletion  Buccal Region Moderate depletion  Temple Region Mild depletion  Clavicle Bone Region Mild depletion  Clavicle and Acromion Bone Region No depletion  Scapular Bone Region No depletion  Dorsal Hand No depletion  Patellar Region Mild depletion  Anterior Thigh Region Mild depletion  Posterior Calf Region No depletion  Edema (RD Assessment) None  Hair Reviewed  Eyes Reviewed  Mouth Reviewed  Skin Reviewed  Nails Reviewed   Diet Order:   Diet Order             Diet Carb Modified Fluid consistency: Thin; Room service appropriate? Yes  Diet effective now                   EDUCATION NEEDS:  No education needs have been identified at this time  Skin:  Skin Assessment: Reviewed RN Assessment (posterior head laceration)  Last BM:  9/25  Height:  Ht Readings from Last 1 Encounters:  09/08/23 5\' 10"  (1.778 m)    Weight:  Wt Readings from Last 1 Encounters:  09/08/23 88.5 kg    Ideal Body Weight:  75.5 kg  BMI:  Body mass index is 27.98 kg/m.  Estimated Nutritional Needs:  Kcal:  1900-2100 Protein:  95-110g Fluid:  >/=  2L    Greig Castilla, RD, LDN Clinical Dietitian RD pager # available in [facility]  After hours/weekend pager # available in [facility]

## 2023-09-18 NOTE — Progress Notes (Addendum)
PT Cancellation Note  Patient Details Name: Mathew Simpson MRN: [id number] DOB: 1936-09-14   Cancelled Treatment:    Reason Eval/Treat Not Completed: Fatigue/lethargy limiting ability to participate  Attempted to work with [name] but he is lethargic and difficult to wake. Pt does respond "yes" when speaking to him but will not open eyes. RN reports cognitive status declined earlier, and is more confused. Will hold PT until more alert and able to better participate.  Kathlyn [place], PT, DPT [facility]  Rehabilitation Services Physical Therapist Office: [phone] Website: [email or web address]   Berton Mount 09/18/2023, 5:32 PM

## 2023-09-19 DIAGNOSIS — S065XAA Traumatic subdural hemorrhage with loss of consciousness status unknown, initial encounter: Secondary | ICD-10-CM | POA: Diagnosis not present

## 2023-09-19 LAB — GLUCOSE, CAPILLARY
Glucose-Capillary: 126 mg/dL — ABNORMAL HIGH (ref 70–99)
Glucose-Capillary: 175 mg/dL — ABNORMAL HIGH (ref 70–99)
Glucose-Capillary: 113 mg/dL — ABNORMAL HIGH (ref 70–99)
Glucose-Capillary: 158 mg/dL — ABNORMAL HIGH (ref 70–99)

## 2023-09-19 MED ORDER — SODIUM CHLORIDE 0.9 % IV SOLN: INTRAVENOUS | Status: AC

## 2023-09-19 NOTE — Progress Notes (Signed)
OT Cancellation Note  Patient Details Name: Mathew Simpson MRN: [id number] DOB: [birth year]   Cancelled Treatment:    Reason Eval/Treat Not Completed: Other (comment). Rn reports pt. Not feeling well today and is experiencing nausea/vomiting.  Requesting to allow him to rest for now.   [name]/L 09/19/2023, 11:09 AM

## 2023-09-19 NOTE — Progress Notes (Signed)
PROGRESS NOTE    Mathew Simpson  OZD:[id number] DOB: July 25, 1936 DOA: 09/08/2023 PCP: [name], PA-C    Brief Narrative:   Mathew Simpson is a 87 y.o. male with past medical history significant for persistent atrial fibrillation on Eliquis, HTN, type 2 diabetes mellitus, vertigo who presented to [facility] ED on 9/17 via EMS from home after mechanical fall at home.  Family believes he may have fallen backwards from the top of the stairs with suspected loss of consciousness.  Found by neighbor.  EMS reports since fall he was having dizziness, nausea, vomiting and saying repetitive questions.  Complicated by Eliquis use, took the medication earlier in the morning.  In the ED, temperature 97.5 F, HR 68, RR 18, BP 149/93, SpO2 94% on room air.  WBC 6.0, hemoglobin 12.7, platelet count 151.  Sodium 138, potassium 4.0, chloride 102, CO2 27, glucose 127, BUN 19, creat 1.30.  AST 24, ALT 19, total bilirubin 1.3.  EtOH level less than 10.  Procalcitonin less than 0.10.  CK 75.  Chest x-ray with low lung volumes, prominent interstitial markings, no acute findings.  Pelvis x-ray negative.  CT head and C-spine without contrast with 3 mm subdural hematoma anterior falx, no midline shift, 6 mm hemorrhagic contusion posterior left frontal lobe, asymmetric widening of the space between the dens and left lateral mass of the C1 relative the right lateral mass, suspicious for worry as some fixation.  Patient was seen by neurosurgery, follow-up CT scans demonstrate stable appearance small anterior falcine subdural and left frontal contusion does not require any acute intervention; Dr. Conchita Paris; with recommendation of holding Eliquis for 2 weeks, no requirement of cervical immobilization and outpatient follow-up in neurosurg clinic as needed.  TRH consulted for admission.  Assessment & Plan:   Subdural hematoma Traumatic brain injury Left frontal contusion  Scalp laceration Mechanical fall Patient presenting via EMS  after a mechanical fall at home striking his head with suspected loss of consciousness.  CT head and C-spine without contrast on admission  with 3 mm subdural hematoma anterior falx, no midline shift, 6 mm hemorrhagic contusion posterior left frontal lobe, asymmetric widening of the space between the dens and left lateral mass of the C1 relative the right lateral mass, suspicious for worry as some fixation.  Patient was seen by neurosurgery, follow-up CT scans demonstrate stable appearance small anterior falcine subdural and left frontal contusion does not require any acute intervention; Dr. Conchita Paris; with recommendation of holding Eliquis for 2 weeks, no requirement of cervical immobilization and outpatient follow-up in neurosurg clinic as needed.  -- Repeat CT head without contrast 9/25 w/ stable SDH -- Continue to hold Eliquis x 2 weeks -- Continue PT/OT efforts -- Fall precautions -- Staples removed 9/27  Persistent atrial fibrillation with sinus pauses Seen by cardiology, appear asymptomatic.  Echocardiogram with LVEF 65 diastolic percent, normal RV function, mild biatrial enlargement, mild AI, dilated aortic root measuring 43 mm.  No indication for permanent pacemaker placement. -- Diltiazem decreased to 120 mg p.o. daily -- Continue monitor on telemetry -- Cardiology signed off, recommend Zio patch x 2 weeks  Essential hypertension -- Diltiazem 120 mg p.o. daily -- Irbesartan 300 mg p.o. daily  Vertigo -- Continue PT  Type 2 diabetes mellitus Hemoglobin A1c 6.7. -- SSI for coverage -- CBGs qAC/HS  Moderate protein calorie malnutrition Body mass index is 27.98 kg/m. Nutrition Status: Nutrition Problem: Moderate Malnutrition Etiology: social / environmental circumstances Signs/Symptoms: mild fat depletion, mild muscle depletion  Interventions: Ensure Enlive (each supplement provides 350kcal and 20 grams of protein), MVI -- Seen by dietitian, continue encourage increased oral  intake, supplementation  Hospital-acquired delirium -- Delirium precautions -- Get up during the day -- Encourage a familiar face to remain present throughout the day -- Keep blinds open and lights on during daylight hours -- Minimize the use of opioids/benzodiazepines -- melatonin 6 mg PO qhs  Weakness/debility/deconditioning/gait disturbance: PT/OT initially recommending SNF placement, denied by insurance carrier following peer to peer. --TOC following, family now appealing insurance decision/denial    DVT prophylaxis: enoxaparin (LOVENOX) injection 40 mg Start: 09/10/23 2200 SCDs Start: 09/08/23 2101    Code Status: Full Code Family Communication: No family present at bedside this morning  Disposition Plan:  Level of care: Med-Surg Status is: Inpatient Remains inpatient appropriate because: Pending insurance authorization for SNF placement      Consultants:  Neurosurgery, Dr. Conchita Paris Cardiology  Procedures:  TTE  Antimicrobials:  None   Subjective: Patient seen examined bedside, resting calmly.  Lying in bed.  Slightly confused.  Reports poor appetite.  RN reports did not eat much of dinner last night.  No focal neurological deficits.  Suspect waxing/waning hospital-acquired delirium.  Has had several serial stable CT heads. Still awaiting appeal by family for insurance denial for SNF.  Remains medically ready for discharge once bed available and received approval.  No other specific questions or concerns at this time.  No family present bedside.  Denies headache, no fever/chills/night sweats, no nausea/vomiting/diarrhea, no chest pain, no palpitations, no shortness of breath, no abdominal pain, no focal weakness, no fatigue, no paresthesia.  No acute events overnight per nurse staff.  Objective: Vitals:   09/18/23 2036 09/18/23 2317 09/19/23 0343 09/19/23 0810  BP: 138/75 (!) 102/54 121/71 (!) 164/63  Pulse: (!) 101 79 (!) 59 93  Resp: 18 16 17 18   Temp: 99.4  F (37.4 C) 99.2 F (37.3 C) 99.2 F (37.3 C) 99.8 F (37.7 C)  TempSrc: Oral Oral Oral Oral  SpO2: 94% 96% (!) 89% 90%  Weight:      Height:        Intake/Output Summary (Last 24 hours) at 09/19/2023 1113 Last data filed at 09/19/2023 6578 Gross per 24 hour  Intake 1 ml  Output 400 ml  Net -399 ml   Filed Weights   09/08/23 1601  Weight: 88.5 kg    Examination:  Physical Exam: GEN: NAD, alert and oriented to Place [facility]), person [employer]), but not time (no answer), chronically ill in appearance, appears older than stated age HEENT: PERRL, sclera clear, MMM, noted posterior scalp laceration with scab overlying, staples removed 9/27  PULM: CTAB w/o wheezes/crackles, normal respiratory effort, on room air CV: IIR, normal rate, w/o M/G/R GI: abd soft, NTND, NABS MSK: no peripheral edema, moves all extremity independently NEURO: No focal neurological deficits Integumentary: Scalp laceration above, otherwise no concerning rashes/lesions/wounds noted on exposed skin surfaces.    Data Reviewed: I have personally reviewed following labs and imaging studies  CBC: Recent Labs  Lab 09/17/23 0658  WBC 6.8  HGB 13.2  HCT 37.3*  MCV 90.3  PLT 166   Basic Metabolic Panel: Recent Labs  Lab 09/13/23 0126 09/15/23 0559 09/17/23 0658  NA 135 136 136  K 3.7 4.0 3.6  CL 98 102 105  CO2 26 23 22   GLUCOSE 125* 196* 143*  BUN 19 25* 21  CREATININE 1.06 1.21 1.14  CALCIUM 8.7* 8.8* 8.8*  MG 2.1  2.1 2.1  PHOS 3.2  --  3.6   GFR: Estimated Creatinine Clearance: 53.1 mL/min (by C-G formula based on SCr of 1.14 mg/dL). Liver Function Tests: Recent Labs  Lab 09/17/23 0658  AST 24  ALT 28  ALKPHOS 78  BILITOT 1.5*  PROT 6.2*  ALBUMIN 3.3*   No results for input(s): "LIPASE", "AMYLASE" in the last 168 hours. No results for input(s): "AMMONIA" in the last 168 hours. Coagulation Profile: No results for input(s): "INR", "PROTIME" in the last 168 hours. Cardiac  Enzymes: No results for input(s): "CKTOTAL", "CKMB", "CKMBINDEX", "TROPONINI" in the last 168 hours. BNP (last 3 results) No results for input(s): "PROBNP" in the last 8760 hours. HbA1C: No results for input(s): "HGBA1C" in the last 72 hours. CBG: Recent Labs  Lab 09/18/23 0810 09/18/23 1216 09/18/23 1609 09/18/23 2107 09/19/23 0632  GLUCAP 127* 114* 189* 128* 126*   Lipid Profile: No results for input(s): "CHOL", "HDL", "LDLCALC", "TRIG", "CHOLHDL", "LDLDIRECT" in the last 72 hours. Thyroid Function Tests: No results for input(s): "TSH", "T4TOTAL", "FREET4", "T3FREE", "THYROIDAB" in the last 72 hours.  Anemia Panel: No results for input(s): "VITAMINB12", "FOLATE", "FERRITIN", "TIBC", "IRON", "RETICCTPCT" in the last 72 hours. Sepsis Labs: No results for input(s): "PROCALCITON", "LATICACIDVEN" in the last 168 hours.  No results found for this or any previous visit (from the past 240 hour(s)).       Radiology Studies: No results found.      Scheduled Meds:  diltiazem  120 mg Oral Daily   enoxaparin (LOVENOX) injection  40 mg Subcutaneous Q24H   feeding supplement  1 Container Oral TID BM   insulin aspart  0-5 Units Subcutaneous QHS   insulin aspart  0-9 Units Subcutaneous TID WC   irbesartan  300 mg Oral Daily   melatonin  6 mg Oral QHS   metoprolol tartrate  12.5 mg Oral BID   multivitamin with minerals  1 tablet Oral Daily   polyethylene glycol  17 g Oral BID   Continuous Infusions:  sodium chloride 100 mL/hr at 09/19/23 1050     LOS: 2 days    Time spent: 52 minutes spent on chart review, discussion with nursing staff, consultants, updating family and interview/physical exam; more than 50% of that time was spent in counseling and/or coordination of care.    Alvira Philips [place], DO [facility] Available via Epic secure chat 7am-7pm After these hours, please refer to coverage provider listed on [email or web address] 09/19/2023, 11:13 AM

## 2023-09-20 ENCOUNTER — Inpatient Hospital Stay (HOSPITAL_COMMUNITY): Payer: Medicare HMO

## 2023-09-20 DIAGNOSIS — S065XAA Traumatic subdural hemorrhage with loss of consciousness status unknown, initial encounter: Secondary | ICD-10-CM | POA: Diagnosis not present

## 2023-09-20 LAB — URINALYSIS, ROUTINE W REFLEX MICROSCOPIC
Bilirubin Urine: NEGATIVE
Glucose, UA: >=500 mg/dL — AB
Ketones, ur: 5 mg/dL — AB
Nitrite: NEGATIVE
Protein, ur: 100 mg/dL — AB
Specific Gravity, Urine: 1.018 (ref 1.005–1.030)
pH: 5 (ref 5.0–8.0)

## 2023-09-20 LAB — GLUCOSE, CAPILLARY
Glucose-Capillary: 140 mg/dL — ABNORMAL HIGH (ref 70–99)
Glucose-Capillary: 200 mg/dL — ABNORMAL HIGH (ref 70–99)
Glucose-Capillary: 270 mg/dL — ABNORMAL HIGH (ref 70–99)
Glucose-Capillary: 357 mg/dL — ABNORMAL HIGH (ref 70–99)

## 2023-09-20 LAB — CBC
HCT: 40.6 % (ref 39.0–52.0)
HCT: 41.3 % (ref 39.0–52.0)
Hemoglobin: 13.4 g/dL (ref 13.0–17.0)
Hemoglobin: 13.9 g/dL (ref 13.0–17.0)
MCH: 30.2 pg (ref 26.0–34.0)
MCH: 30.3 pg (ref 26.0–34.0)
MCHC: 33 g/dL (ref 30.0–36.0)
MCHC: 33.7 g/dL (ref 30.0–36.0)
MCV: 91.4 fL (ref 80.0–100.0)
MCV: 90.2 fL (ref 80.0–100.0)
Platelets: 136 10*3/uL — ABNORMAL LOW (ref 150–400)
Platelets: 138 10*3/uL — ABNORMAL LOW (ref 150–400)
RBC: 4.44 MIL/uL (ref 4.22–5.81)
RBC: 4.58 MIL/uL (ref 4.22–5.81)
RDW: 13.3 % (ref 11.5–15.5)
RDW: 13.5 % (ref 11.5–15.5)
WBC: 6.5 10*3/uL (ref 4.0–10.5)
WBC: 6.4 10*3/uL (ref 4.0–10.5)
nRBC: 0 % (ref 0.0–0.2)
nRBC: 0 % (ref 0.0–0.2)

## 2023-09-20 LAB — BASIC METABOLIC PANEL
Anion gap: 10 (ref 5–15)
BUN: 31 mg/dL — ABNORMAL HIGH (ref 8–23)
CO2: 22 mmol/L (ref 22–32)
Calcium: 8.2 mg/dL — ABNORMAL LOW (ref 8.9–10.3)
Chloride: 110 mmol/L (ref 98–111)
Creatinine, Ser: 1.28 mg/dL — ABNORMAL HIGH (ref 0.61–1.24)
GFR, Estimated: 55 mL/min — ABNORMAL LOW (ref >60)
Glucose, Bld: 215 mg/dL — ABNORMAL HIGH (ref 70–99)
Potassium: 3.4 mmol/L — ABNORMAL LOW (ref 3.5–5.1)
Sodium: 142 mmol/L (ref 135–145)

## 2023-09-20 LAB — LACTIC ACID, PLASMA: Lactic Acid, Venous: 2.7 mmol/L (ref 0.5–1.9)

## 2023-09-20 LAB — PROCALCITONIN: Procalcitonin: 8.44 ng/mL

## 2023-09-20 LAB — MAGNESIUM: Magnesium: 1.9 mg/dL (ref 1.7–2.4)

## 2023-09-20 MED ORDER — QUETIAPINE FUMARATE 25 MG PO TABS
12.5000 mg | ORAL_TABLET | Freq: Every day | ORAL | Status: DC
  Administered 2023-09-20 – 2023-09-30 (×11): 12.5 mg via ORAL
  Filled 2023-09-20 (×11): qty 1

## 2023-09-20 MED ORDER — SODIUM CHLORIDE 0.9 % IV SOLN: 2.0000 g | Freq: Once | INTRAVENOUS | Status: DC

## 2023-09-20 MED ORDER — METRONIDAZOLE 500 MG/100ML IV SOLN
500.0000 mg | Freq: Two times a day (BID) | INTRAVENOUS | Status: DC
  Administered 2023-09-21 (×2): 500 mg via INTRAVENOUS
  Filled 2023-09-20 (×2): qty 100

## 2023-09-20 MED ORDER — SODIUM CHLORIDE 0.9 % IV BOLUS
1000.0000 mL | Freq: Once | INTRAVENOUS | Status: AC
  Administered 2023-09-21: 1000 mL via INTRAVENOUS

## 2023-09-20 MED ORDER — POTASSIUM CHLORIDE 20 MEQ PO PACK
60.0000 meq | PACK | Freq: Once | ORAL | Status: AC
  Administered 2023-09-20: 60 meq via ORAL
  Filled 2023-09-20: qty 3

## 2023-09-20 MED ORDER — VANCOMYCIN HCL 1750 MG/350ML IV SOLN
1750.0000 mg | Freq: Once | INTRAVENOUS | Status: AC
  Administered 2023-09-21: 1750 mg via INTRAVENOUS
  Filled 2023-09-20: qty 350

## 2023-09-20 MED ORDER — ACETAMINOPHEN 325 MG PO TABS
325.0000 mg | ORAL_TABLET | Freq: Once | ORAL | Status: AC
  Administered 2023-09-20: 325 mg via ORAL
  Filled 2023-09-20: qty 1

## 2023-09-20 NOTE — Progress Notes (Signed)
PROGRESS NOTE    Mathew Simpson  MWN:[id number] DOB: 16-Aug-1936 DOA: 09/08/2023 PCP: [name], PA-C    Brief Narrative:   Mathew Simpson is a 87 y.o. male with past medical history significant for persistent atrial fibrillation on Eliquis, HTN, type 2 diabetes mellitus, vertigo who presented to [facility] ED on 9/17 via EMS from home after mechanical fall at home.  Family believes he may have fallen backwards from the top of the stairs with suspected loss of consciousness.  Found by neighbor.  EMS reports since fall he was having dizziness, nausea, vomiting and saying repetitive questions.  Complicated by Eliquis use, took the medication earlier in the morning.  In the ED, temperature 97.5 F, HR 68, RR 18, BP 149/93, SpO2 94% on room air.  WBC 6.0, hemoglobin 12.7, platelet count 151.  Sodium 138, potassium 4.0, chloride 102, CO2 27, glucose 127, BUN 19, creat 1.30.  AST 24, ALT 19, total bilirubin 1.3.  EtOH level less than 10.  Procalcitonin less than 0.10.  CK 75.  Chest x-ray with low lung volumes, prominent interstitial markings, no acute findings.  Pelvis x-ray negative.  CT head and C-spine without contrast with 3 mm subdural hematoma anterior falx, no midline shift, 6 mm hemorrhagic contusion posterior left frontal lobe, asymmetric widening of the space between the dens and left lateral mass of the C1 relative the right lateral mass, suspicious for worry as some fixation.  Patient was seen by neurosurgery, follow-up CT scans demonstrate stable appearance small anterior falcine subdural and left frontal contusion does not require any acute intervention; Dr. Conchita Paris; with recommendation of holding Eliquis for 2 weeks, no requirement of cervical immobilization and outpatient follow-up in neurosurg clinic as needed.  TRH consulted for admission.  Assessment & Plan:   Subdural hematoma Traumatic brain injury Left frontal contusion  Scalp laceration Mechanical fall Patient presenting via EMS  after a mechanical fall at home striking his head with suspected loss of consciousness.  CT head and C-spine without contrast on admission  with 3 mm subdural hematoma anterior falx, no midline shift, 6 mm hemorrhagic contusion posterior left frontal lobe, asymmetric widening of the space between the dens and left lateral mass of the C1 relative the right lateral mass, suspicious for worry as some fixation.  Patient was seen by neurosurgery, follow-up CT scans demonstrate stable appearance small anterior falcine subdural and left frontal contusion does not require any acute intervention; Dr. Conchita Paris; with recommendation of holding Eliquis for 2 weeks, no requirement of cervical immobilization and outpatient follow-up in neurosurg clinic as needed.  Staples removed on 09/18/2023. -- Repeat CT head without contrast 9/25 w/ stable SDH -- Continue to hold Eliquis x 2 weeks -- Continue PT/OT efforts -- Fall precautions  Persistent atrial fibrillation with sinus pauses Seen by cardiology, appear asymptomatic.  Echocardiogram with LVEF 65 diastolic percent, normal RV function, mild biatrial enlargement, mild AI, dilated aortic root measuring 43 mm.  No indication for permanent pacemaker placement. -- Diltiazem decreased to 120 mg p.o. daily -- Continue monitor on telemetry -- Cardiology signed off, recommend Zio patch x 2 weeks  Essential hypertension -- Diltiazem 120 mg p.o. daily -- Irbesartan 300 mg p.o. daily  Vertigo -- Continue PT  Type 2 diabetes mellitus Hemoglobin A1c 6.7. -- SSI for coverage -- CBGs qAC/HS  Moderate protein calorie malnutrition Body mass index is 27.98 kg/m. Nutrition Status: Nutrition Problem: Moderate Malnutrition Etiology: social / environmental circumstances Signs/Symptoms: mild fat depletion, mild muscle depletion  Interventions: Ensure Enlive (each supplement provides 350kcal and 20 grams of protein), MVI -- Seen by dietitian, continue encourage increased  oral intake, supplementation  Hospital-acquired delirium -- Delirium precautions -- Get up during the day -- Encourage a familiar face to remain present throughout the day -- Keep blinds open and lights on during daylight hours -- Minimize the use of opioids/benzodiazepines -- melatonin 6 mg PO qhs  Weakness/debility/deconditioning/gait disturbance: PT/OT initially recommending SNF placement, denied by insurance carrier following peer to peer. --TOC following, family now appealing insurance decision/denial    DVT prophylaxis: enoxaparin (LOVENOX) injection 40 mg Start: 09/10/23 2200 SCDs Start: 09/08/23 2101    Code Status: Full Code Family Communication: No family present at bedside this morning  Disposition Plan:  Level of care: Med-Surg Status is: Inpatient Remains inpatient appropriate because: Pending insurance authorization for SNF placement      Consultants:  Neurosurgery, Dr. Conchita Paris Cardiology  Procedures:  TTE  Antimicrobials:  None   Subjective: Patient seen examined bedside, resting calmly.  Lying in bed.  Remains confused with poor appetite.  Did not eat much dinner last night per RN.  Also elevated heart rate with A-fib with RVR, likely did not digest his medications after he vomited shortly after administration yesterday morning. No focal neurological deficits.  Suspect waxing/waning hospital-acquired delirium.  Still awaiting appeal by family for insurance denial for SNF.  Remains medically ready for discharge once bed available and received approval.  No other specific questions or concerns at this time.  No family present bedside.  Denies headache, no fever/chills/night sweats, no nausea/vomiting/diarrhea, no chest pain, no palpitations, no shortness of breath, no abdominal pain, no focal weakness, no fatigue, no paresthesia.  No acute events overnight per nurse staff.  Objective: Vitals:   09/19/23 2321 09/20/23 0336 09/20/23 0725 09/20/23 1125  BP:  (!) 107/56 (!) 146/78 (!) 159/92 (!) 154/79  Pulse: 98 98 (!) 108 99  Resp:  18 20 (!) 24  Temp: 98.2 F (36.8 C) 98.3 F (36.8 C)  98.2 F (36.8 C)  TempSrc: Oral Axillary  Oral  SpO2: 97% 100% 93% 93%  Weight:      Height:        Intake/Output Summary (Last 24 hours) at 09/20/2023 1139 Last data filed at 09/20/2023 0700 Gross per 24 hour  Intake 2232.05 ml  Output 600 ml  Net 1632.05 ml   Filed Weights   09/08/23 1601  Weight: 88.5 kg    Examination:  Physical Exam: GEN: NAD, alert and oriented to Place [facility]), person [employer]), but not time (no answer), chronically ill in appearance, appears older than stated age HEENT: PERRL, sclera clear, MMM, noted posterior scalp laceration with scab overlying, staples removed 9/27  PULM: CTAB w/o wheezes/crackles, normal respiratory effort, on room air CV: IIR, normal rate, w/o M/G/R GI: abd soft, NTND, NABS MSK: no peripheral edema, moves all extremity independently NEURO: No focal neurological deficits Integumentary: Scalp laceration above, otherwise no concerning rashes/lesions/wounds noted on exposed skin surfaces.    Data Reviewed: I have personally reviewed following labs and imaging studies  CBC: Recent Labs  Lab 09/17/23 0658  WBC 6.8  HGB 13.2  HCT 37.3*  MCV 90.3  PLT 166   Basic Metabolic Panel: Recent Labs  Lab 09/15/23 0559 09/17/23 0658  NA 136 136  K 4.0 3.6  CL 102 105  CO2 23 22  GLUCOSE 196* 143*  BUN 25* 21  CREATININE 1.21 1.14  CALCIUM 8.8* 8.8*  MG  2.1 2.1  PHOS  --  3.6   GFR: Estimated Creatinine Clearance: 53.1 mL/min (by C-G formula based on SCr of 1.14 mg/dL). Liver Function Tests: Recent Labs  Lab 09/17/23 0658  AST 24  ALT 28  ALKPHOS 78  BILITOT 1.5*  PROT 6.2*  ALBUMIN 3.3*   No results for input(s): "LIPASE", "AMYLASE" in the last 168 hours. No results for input(s): "AMMONIA" in the last 168 hours. Coagulation Profile: No results for input(s): "INR",  "PROTIME" in the last 168 hours. Cardiac Enzymes: No results for input(s): "CKTOTAL", "CKMB", "CKMBINDEX", "TROPONINI" in the last 168 hours. BNP (last 3 results) No results for input(s): "PROBNP" in the last 8760 hours. HbA1C: No results for input(s): "HGBA1C" in the last 72 hours. CBG: Recent Labs  Lab 09/19/23 0632 09/19/23 1114 09/19/23 1606 09/19/23 2124 09/20/23 0606  GLUCAP 126* 175* 113* 158* 140*   Lipid Profile: No results for input(s): "CHOL", "HDL", "LDLCALC", "TRIG", "CHOLHDL", "LDLDIRECT" in the last 72 hours. Thyroid Function Tests: No results for input(s): "TSH", "T4TOTAL", "FREET4", "T3FREE", "THYROIDAB" in the last 72 hours.  Anemia Panel: No results for input(s): "VITAMINB12", "FOLATE", "FERRITIN", "TIBC", "IRON", "RETICCTPCT" in the last 72 hours. Sepsis Labs: No results for input(s): "PROCALCITON", "LATICACIDVEN" in the last 168 hours.  No results found for this or any previous visit (from the past 240 hour(s)).       Radiology Studies: No results found.      Scheduled Meds:  diltiazem  120 mg Oral Daily   enoxaparin (LOVENOX) injection  40 mg Subcutaneous Q24H   feeding supplement  1 Container Oral TID BM   insulin aspart  0-5 Units Subcutaneous QHS   insulin aspart  0-9 Units Subcutaneous TID WC   irbesartan  300 mg Oral Daily   melatonin  6 mg Oral QHS   metoprolol tartrate  12.5 mg Oral BID   multivitamin with minerals  1 tablet Oral Daily   polyethylene glycol  17 g Oral BID   Continuous Infusions:     LOS: 3 days    Time spent: 52 minutes spent on chart review, discussion with nursing staff, consultants, updating family and interview/physical exam; more than 50% of that time was spent in counseling and/or coordination of care.    Alvira Philips [place], DO [facility] Available via Epic secure chat 7am-7pm After these hours, please refer to coverage provider listed on [email or web address] 09/20/2023, 11:39 AM

## 2023-09-20 NOTE — Progress Notes (Addendum)
Notified by RN that patient has been increasingly more confused since morning and his speech has been garbled.  Dayshift nurse had reported that patient was oriented to self only this morning but RN tonight noticed that at present patient does not even know his name.  He keeps referring to himself as "John."  Appears very confused.  He is moving all of his extremities spontaneously and has no focal weakness.  RN reporting that patient's pupils are equal and reactive to light.  Also notified by RN that patient was slightly tachycardic during dayshift but tonight started having fever and chills.  Oral temperature was 103.1 F.  He has persistent A-fib and heart rate had increased to 130s with fever.  He was given Tylenol after which heart rate improved.  Current vital signs: Temperature 99.5 F, heart rate 95, respiratory rate 35, blood pressure 164/72, and SpO2 96% on room air.  -Stat CT head ordered given concern for change in mental status -Stat CBC, lactate, procalcitonin, chest x-ray, and UA ordered for infectious workup given fever and chills.  Will likely need to be started on antibiotics based on lab and imaging results.  Also blood cultures have been ordered. -Continue Tylenol as needed for fevers  Addendum/update: -Chest x-ray showing no active disease. -UA with negative nitrite, trace leukocytes, and microscopy showing 11-20 WBCs and few bacteria.  Urine culture added on. -Labs showing no leukocytosis. -Lactic acid 2.7.  Last blood pressure 145/73.  1 L normal saline bolus ordered and continue to trend lactate. -Start broad-spectrum antibiotics including vancomycin, aztreonam, and metronidazole given concern for sepsis.  Penicillin allergy listed. -Procalcitonin level pending. -Blood cultures pending. -Patient is currently getting CT head, follow-up results.

## 2023-09-20 NOTE — TOC Progression Note (Signed)
Transition of Care [facility]) - Progression Note    Patient Details  Name: Mathew Simpson MRN: [id number] Date of Birth: July 28, 1936  [facility]) CM/SW Contact  Dellie Burns [place], [place] Phone Number: 09/20/2023, 8:46 AM  Clinical Narrative:   Spoke to [facility] with [employer] appeals [phone] who requested MD progress note and PT note be faxed to [phone]. Requested information faxed. Per Sunny Schlein, they will f/u with determination likely by tomorrow.   Dellie Burns, MSW, LCSW [phone] (coverage)      Expected Discharge Plan: Skilled Nursing Facility Barriers to Discharge: Continued Medical Work up, English as a second language teacher  Expected Discharge Plan and Services     Post Acute Care Choice: Skilled Nursing Facility Living arrangements for the past 2 months: Single Family Home                                       Social Determinants of Health (SDOH) Interventions SDOH Screenings   Food Insecurity: No Food Insecurity (09/10/2023)  Housing: Low Risk  (09/10/2023)  Transportation Needs: No Transportation Needs (09/10/2023)  Utilities: Not At Risk (09/10/2023)  Tobacco Use: Medium Risk (09/08/2023)    Readmission Risk Interventions    07/23/2022   11:40 AM 07/21/2022   10:11 AM  Readmission Risk Prevention Plan  Post Dischage Appt Complete Complete  Medication Screening Complete Complete  Transportation Screening Complete Complete

## 2023-09-21 ENCOUNTER — Encounter (INDEPENDENT_AMBULATORY_CARE_PROVIDER_SITE_OTHER): Payer: Medicare HMO | Admitting: Ophthalmology

## 2023-09-21 ENCOUNTER — Inpatient Hospital Stay (HOSPITAL_COMMUNITY): Payer: Medicare HMO

## 2023-09-21 DIAGNOSIS — Z880 Allergy status to penicillin: Secondary | ICD-10-CM

## 2023-09-21 DIAGNOSIS — B9561 Methicillin susceptible Staphylococcus aureus infection as the cause of diseases classified elsewhere: Secondary | ICD-10-CM | POA: Diagnosis not present

## 2023-09-21 DIAGNOSIS — R7881 Bacteremia: Secondary | ICD-10-CM | POA: Diagnosis not present

## 2023-09-21 DIAGNOSIS — S065XAA Traumatic subdural hemorrhage with loss of consciousness status unknown, initial encounter: Secondary | ICD-10-CM | POA: Diagnosis not present

## 2023-09-21 LAB — BLOOD CULTURE ID PANEL (REFLEXED) - BCID2

## 2023-09-21 LAB — MRSA NEXT GEN BY PCR, NASAL: MRSA by PCR Next Gen: NOT DETECTED

## 2023-09-21 LAB — GLUCOSE, CAPILLARY
Glucose-Capillary: 193 mg/dL — ABNORMAL HIGH (ref 70–99)
Glucose-Capillary: 206 mg/dL — ABNORMAL HIGH (ref 70–99)
Glucose-Capillary: 170 mg/dL — ABNORMAL HIGH (ref 70–99)
Glucose-Capillary: 128 mg/dL — ABNORMAL HIGH (ref 70–99)

## 2023-09-21 LAB — ECHOCARDIOGRAM COMPLETE
AR max vel: 3.55 cm2
AV Area VTI: 3.43 cm2
AV Area mean vel: 3.7 cm2
AV Mean grad: 2 mm[Hg]
AV Peak grad: 3.6 mm[Hg]
Ao pk vel: 0.95 m/s
Area-P 1/2: 4.77 cm2
Height: 70 in
S' Lateral: 2.8 cm
Weight: 3120 [oz_av]

## 2023-09-21 LAB — LACTIC ACID, PLASMA: Lactic Acid, Venous: 1.5 mmol/L (ref 0.5–1.9)

## 2023-09-21 MED ORDER — CEFAZOLIN SODIUM-DEXTROSE 2-4 GM/100ML-% IV SOLN
2.0000 g | Freq: Three times a day (TID) | INTRAVENOUS | Status: DC
  Administered 2023-09-21 – 2023-10-01 (×28): 2 g via INTRAVENOUS
  Filled 2023-09-21 (×31): qty 100

## 2023-09-21 MED ORDER — IOHEXOL 9 MG/ML PO SOLN
500.0000 mL | ORAL | Status: AC
  Administered 2023-09-21: 500 mL via ORAL

## 2023-09-21 MED ORDER — IOHEXOL 350 MG/ML SOLN
75.0000 mL | Freq: Once | INTRAVENOUS | Status: AC | PRN
  Administered 2023-09-21: 75 mL via INTRAVENOUS

## 2023-09-21 MED ORDER — SODIUM CHLORIDE 0.9 % IV SOLN: INTRAVENOUS | Status: AC

## 2023-09-21 MED ORDER — SODIUM CHLORIDE 0.9 % IV SOLN
2.0000 g | Freq: Once | INTRAVENOUS | Status: AC
  Administered 2023-09-21: 2 g via INTRAVENOUS
  Filled 2023-09-21: qty 10

## 2023-09-21 MED ORDER — SODIUM CHLORIDE 0.9 % IV SOLN
2.0000 g | Freq: Three times a day (TID) | INTRAVENOUS | Status: DC
  Administered 2023-09-21: 2 g via INTRAVENOUS
  Filled 2023-09-21 (×2): qty 10

## 2023-09-21 MED ORDER — SODIUM CHLORIDE 0.9 % IV BOLUS
1000.0000 mL | Freq: Once | INTRAVENOUS | Status: AC
  Administered 2023-09-21: 1000 mL via INTRAVENOUS

## 2023-09-21 MED ORDER — VANCOMYCIN HCL 1250 MG/250ML IV SOLN: 1250.0000 mg | INTRAVENOUS | Status: DC

## 2023-09-21 NOTE — Significant Event (Signed)
Rapid Response Event Note   Reason for Call :  Asked to see pt as 2nd set of eyes.  RRT called originally at 2036 d/t RED MEWS-tachycardia, fever, confusion. At that time, pt was being given tylenol and MD was placing addition orders in the chart.   Orders: PCXR-negative CBC LA-2.7>1L NS bolus ordered Pct BC x 2  U/A CT head-no acute findings Abx-Vanc, Aztreonam, Flagyl  Called by PCP at 0021 with request to eyeball pt.   Pt seen at 0025.  Initial Focused Assessment:  Pt lying in bed with eyes closed, in no visible distress. Pt will shakes his head back and forth and move extremities with no stimulation. He seems to move BUE equally and BLE equally. Pupils 3, equal, reactive. Pt does not follow commands or speak. Lungs clear, diminished in the bases. ABD soft, nontender. Skin hot to touch, dry.  T-100.9, HR-107, BP-91/55, RR-26, SpO2-99% on RA  Interventions:  Additional 1L NS ordered   Plan of Care:  Give additional fluid. Monitor BP closely. Please call RRT if further assistance needed.   Event Summary:   MD Notified: Dr. Loney Loh notified of RRT assessment Call Time:0021 Arrival Time:0025 End Time:0100  Terrilyn Saver, RN

## 2023-09-21 NOTE — Plan of Care (Signed)
At the beginning of the shift, patient very confused and only mumbling words not having any meaningful conversation. Pt appeared to be shivering, VS check indicated RED MEWs with fever Tmax 103.72F. see MAR for tylenol administration. Provider notified, rapid RN notified along with charge RN. CT of head ordered but took multiple attempt as patient was unable to stay still initially. After nighttime meds, patient was more redirectable.   Critical lab call received from lactic acid of 2.7. once again provider notified, total of 2L NS bolus along with multiple IV abx initiated, see MAR. Since this patient has been yellow MEWs with HR in 90s and 80s but RR remains shallow and tachy in high 20s (improvement from earlier in the shift). CXR obtained.   Pt also had a very large BM at shift change. Pt remains A&O X 0. Saying yes to every choices given for self but does respond when called by his first name.   Problem: Fluid Volume: Goal: Ability to maintain a balanced intake and output will improve Outcome: Progressing   Problem: Skin Integrity: Goal: Risk for impaired skin integrity will decrease Outcome: Progressing   Problem: Tissue Perfusion: Goal: Adequacy of tissue perfusion will improve Outcome: Progressing   Problem: Elimination: Goal: Will not experience complications related to bowel motility Outcome: Progressing   Problem: Safety: Goal: Ability to remain free from injury will improve Outcome: Progressing

## 2023-09-21 NOTE — Progress Notes (Signed)
Pharmacy Antibiotic Note  Mathew Simpson is a 87 y.o. male admitted on 09/08/2023 with SDH, now spiking fevers with elevated LA and PCT >> concern for sepsis, now has MSSA bacteremia. Pharmacy was previously consulted for vancomycin and aztreonam dosing. Now de-escalating to Cefazolin.   WBC wnl, scr up to 1.28 on 9/29, Tmax 24 hr 103.1  Plan: Discontinue vancomycin, metronidazole, aztreonem Start Cefazolin IV 2g Q8H  Monitor renal function, cx, fever curve, clinical status   Height: 5\' 10"  (177.8 cm) Weight: 88.5 kg (195 lb) IBW/kg (Calculated) : 73  Temp (24hrs), Avg:99.8 F (37.7 C), Min:97.9 F (36.6 C), Max:103.1 F (39.5 C)  Recent Labs  Lab 09/15/23 0559 09/17/23 0658 09/20/23 1202 09/20/23 2255 09/21/23 0241  WBC  --  6.8 6.5 6.4  --   CREATININE 1.21 1.14 1.28*  --   --   LATICACIDVEN  --   --   --  2.7* 1.5    Estimated Creatinine Clearance: 47.3 mL/min (A) (by C-G formula based on SCr of 1.28 mg/dL (H)).    Allergies  Allergen Reactions   Penicillins Anaphylaxis    Tolerates ancef  Did it involve swelling of the face/tongue/throat, SOB, or low BP? Yes Did it involve sudden or severe rash/hives, skin peeling, or any reaction on the inside of your mouth or nose? Unknown Did you need to seek medical attention at a hospital or doctor's office? Yes When did it last happen?      50+ years If all above answers are "NO", may proceed with cephalosporin use.     Antimicrobials this admission: Vanc 9/30 x 1 Aztreonam 9/30 x 1 Flagyl 9/30 x 1 Ancef 9/30 >>   Microbiology results: 9/29 BCx: 2/2 MSSA  9/30 MRSA PCR: negative  Verdene Rio, PharmD PGY1 Pharmacy Resident

## 2023-09-21 NOTE — Progress Notes (Signed)
PT Cancellation Note  Patient Details Name: Mathew Simpson MRN: [id number] DOB: Sep 09, 1936   Cancelled Treatment:    Reason Eval/Treat Not Completed: Patient not medically ready Per chart pt with RRT called earlier this morning, CT ordered, pt with fever. Nurse reports pt is somewhat able to be aroused but not likely to purposefully participate in therapy at this time. Will hold PT tx at this time, will f/u as able.  Aleda Grana, PT, DPT 09/21/23, 9:37 AM   Sandi Mariscal 09/21/2023, 9:36 AM

## 2023-09-21 NOTE — Progress Notes (Signed)
*  PRELIMINARY RESULTS* Echocardiogram 2D Echocardiogram has been performed.  Mathew Simpson 09/21/2023, 4:11 PM

## 2023-09-21 NOTE — Progress Notes (Signed)
MEWS Progress Note  Patient Details Name: ADIEN KIMMEL MRN: [id number] DOB: 05-09-1936 Today's Date: 09/21/2023   MEWS Flowsheet Documentation:  Assess: MEWS Score Temp: 99.7 F (37.6 C) BP: 103/73 MAP (mmHg): 82 Pulse Rate: 95 ECG Heart Rate: 92 Resp: (!) 28 Level of Consciousness: Alert SpO2: 95 % O2 Device: Room Air Patient Activity (if Appropriate): In bed O2 Flow Rate (L/min): 2 L/min FiO2 (%): (!) 0 % Assess: MEWS Score MEWS Temp: 0 MEWS Systolic: 0 MEWS Pulse: 0 MEWS RR: 2 MEWS LOC: 0 MEWS Score: 2 MEWS Score Color: Yellow Assess: SIRS CRITERIA SIRS Temperature : 0 SIRS Respirations : 1 SIRS Pulse: 1 SIRS WBC: 0 SIRS Score Sum : 2 SIRS Temperature : 0 SIRS Pulse: 1 SIRS Respirations : 1 SIRS WBC: 0 SIRS Score Sum : 2 Assess: if the MEWS score is Yellow or Red Were vital signs accurate and taken at a resting state?: Yes Does the patient meet 2 or more of the SIRS criteria?: Yes Does the patient have a confirmed or suspected source of infection?: Yes MEWS guidelines implemented : Yes, red Treat MEWS Interventions: Considered administering scheduled or prn medications/treatments as ordered Take Vital Signs Increase Vital Sign Frequency : Red: Q1hr x2, continue Q4hrs until patient remains green for 12hrs Escalate MEWS: Escalate: Red: Discuss with charge nurse and notify provider. Consider notifying RRT. If remains red for 2 hours consider need for higher level of care        [place] Oborn B Janashia Parco 09/21/2023, 6:03 AM

## 2023-09-21 NOTE — TOC Progression Note (Signed)
Transition of Care [facility]) - Progression Note    Patient Details  Name: VARDAAN DEPASCALE MRN: [id number] Date of Birth: 1936-11-04  [facility] CM/SW Contact  Baldemar Lenis, [place] Phone Number: 09/21/2023, 3:34 PM  Clinical Narrative:   CSW received update from [facility] that patient's appeal was overturned, and he has been approved for SNF. CSW sent update to granddaughter, she was appreciative. CSW updated [facility], they still have a bed for the patient. Patient awaiting medical stability. CSW to follow.    Expected Discharge Plan: Skilled Nursing Facility Barriers to Discharge: Continued Medical Work up, English as a second language teacher  Expected Discharge Plan and Services     Post Acute Care Choice: Skilled Nursing Facility Living arrangements for the past 2 months: Single Family Home                                       Social Determinants of Health (SDOH) Interventions SDOH Screenings   Food Insecurity: No Food Insecurity (09/10/2023)  Housing: Low Risk  (09/10/2023)  Transportation Needs: No Transportation Needs (09/10/2023)  Utilities: Not At Risk (09/10/2023)  Tobacco Use: Medium Risk (09/08/2023)    Readmission Risk Interventions    07/23/2022   11:40 AM 07/21/2022   10:11 AM  Readmission Risk Prevention Plan  Post Dischage Appt Complete Complete  Medication Screening Complete Complete  Transportation Screening Complete Complete

## 2023-09-21 NOTE — Progress Notes (Addendum)
PROGRESS NOTE    Mathew Simpson  ZOX:[id number] DOB: 11/09/1936 DOA: 09/08/2023 PCP: [name], PA-C    Brief Narrative:   Mathew Simpson is a 87 y.o. male with past medical history significant for persistent atrial fibrillation on Eliquis, HTN, type 2 diabetes mellitus, vertigo who presented to [facility] ED on 9/17 via EMS from home after mechanical fall at home.  Family believes he may have fallen backwards from the top of the stairs with suspected loss of consciousness.  Found by neighbor.  EMS reports since fall he was having dizziness, nausea, vomiting and saying repetitive questions.  Complicated by Eliquis use, took the medication earlier in the morning.  In the ED, temperature 97.5 F, HR 68, RR 18, BP 149/93, SpO2 94% on room air.  WBC 6.0, hemoglobin 12.7, platelet count 151.  Sodium 138, potassium 4.0, chloride 102, CO2 27, glucose 127, BUN 19, creat 1.30.  AST 24, ALT 19, total bilirubin 1.3.  EtOH level less than 10.  Procalcitonin less than 0.10.  CK 75.  Chest x-ray with low lung volumes, prominent interstitial markings, no acute findings.  Pelvis x-ray negative.  CT head and C-spine without contrast with 3 mm subdural hematoma anterior falx, no midline shift, 6 mm hemorrhagic contusion posterior left frontal lobe, asymmetric widening of the space between the dens and left lateral mass of the C1 relative the right lateral mass, suspicious for worry as some fixation.  Patient was seen by neurosurgery, follow-up CT scans demonstrate stable appearance small anterior falcine subdural and left frontal contusion does not require any acute intervention; Dr. Conchita Paris; with recommendation of holding Eliquis for 2 weeks, no requirement of cervical immobilization and outpatient follow-up in neurosurg clinic as needed.  TRH consulted for admission.  Assessment & Plan:   Subdural hematoma Traumatic brain injury Left frontal contusion  Scalp laceration Mechanical fall Patient presenting via EMS  after a mechanical fall at home striking his head with suspected loss of consciousness.  CT head and C-spine without contrast on admission  with 3 mm subdural hematoma anterior falx, no midline shift, 6 mm hemorrhagic contusion posterior left frontal lobe, asymmetric widening of the space between the dens and left lateral mass of the C1 relative the right lateral mass, suspicious for worry as some fixation.  Patient was seen by neurosurgery, follow-up CT scans demonstrate stable appearance small anterior falcine subdural and left frontal contusion does not require any acute intervention; Dr. Conchita Paris; with recommendation of holding Eliquis for 2 weeks, no requirement of cervical immobilization and outpatient follow-up in neurosurg clinic as needed.  Staples removed on 09/18/2023. -- Repeat CT head without contrast 9/25 w/ stable SDH -- Repeat head CT w/o contrast 9/29: Interval decrease SDH 2 mm without mass effect, resolution small hemorrhagic contusion, otherwise negative -- Continue to hold Eliquis x 2 weeks -- Continue PT/OT efforts -- Fall precautions  MSSA Bacteremia Overnight patient with temperature 103.1.  Remains confused.  Repeat head CT shows interval decrease in subdural hematoma and resolution of hemorrhagic contusion.  WBC count within normal limits.  Urinalysis unrevealing, chest x-ray with no active cardiopulmonary disease process.  Lactic acid 2.7, procalcitonin 8.44. -- Blood cultures x 2: + GPC's in clusters, BCID + MSSA; further identification/susceptibilities pending -- Urine culture: Pending -- MRSA PCR negative -- CT chest/abdomen/pelvis with contrast: Pending -- Cefazolin 2 g IV every 8 hours -- Continue IVF with -- Supportive care, antipyretics -- CBC, procalcitonin, lactic acid daily  Persistent atrial fibrillation with sinus  pauses Seen by cardiology, appear asymptomatic.  Echocardiogram with LVEF 65 diastolic percent, normal RV function, mild biatrial enlargement, mild  AI, dilated aortic root measuring 43 mm.  No indication for permanent pacemaker placement. -- Diltiazem decreased to 120 mg p.o. daily -- Continue monitor on telemetry -- Cardiology signed off, recommend Zio patch x 2 weeks  Essential hypertension -- Diltiazem 120 mg p.o. daily -- Irbesartan 300 mg p.o. daily  Vertigo -- Continue PT  Type 2 diabetes mellitus Hemoglobin A1c 6.7. -- SSI for coverage -- CBGs qAC/HS  Moderate protein calorie malnutrition Body mass index is 27.98 kg/m. Nutrition Status: Nutrition Problem: Moderate Malnutrition Etiology: social / environmental circumstances Signs/Symptoms: mild fat depletion, mild muscle depletion Interventions: Ensure Enlive (each supplement provides 350kcal and 20 grams of protein), MVI -- Seen by dietitian, continue encourage increased oral intake, supplementation  Hospital-acquired delirium -- Delirium precautions -- Get up during the day -- Encourage a familiar face to remain present throughout the day -- Keep blinds open and lights on during daylight hours -- Minimize the use of opioids/benzodiazepines -- melatonin 6 mg PO at bedtime -- Seroquel 12.5 mg p.o. nightly  Weakness/debility/deconditioning/gait disturbance: PT/OT initially recommending SNF placement, denied by insurance carrier following peer to peer. --TOC following, family now appealing insurance decision/denial    DVT prophylaxis: SCDs Start: 09/08/23 2101    Code Status: Full Code Family Communication: No family present at bedside this morning; updated patient's granddaughter and other family members extensively at bedside yesterday afternoon  Disposition Plan:  Level of care: Telemetry Medical Status is: Inpatient Remains inpatient appropriate because: Pending insurance authorization for SNF placement      Consultants:  Neurosurgery, Dr. Conchita Paris Cardiology  Procedures:  TTE  Antimicrobials:  Vancomycin 9/29 - 9/29 Metronidazole 9/29 -  9/30 Aztreonam 9/29 - 9/30 Cefazolin 9/30>>   Subjective: Patient seen examined bedside, resting calmly.  Lying in bed.  Remains confused, somnolent but awakens to verbal stimuli.  Although responding appropriately to commands.  Fever of 103.1 overnight.  Rapid response with stable CT head, WC count within normal limits, chest x-ray negative for consolidation, urinalysis unrevealing.  Procalcitonin was elevated as well as lactic acid.  Started on empiric antibiotics with vancomycin, metronidazole, and aztreonam.  Blood cultures x 2 positive for MSSA, de-escalating to cefazolin.  ID consulted.  Will further workup with CT chest/abdomen/pelvis today, repeat TTE.  Discussed with RN present at bedside.  No family present at bedside this morning.  Denies headache, no fever/chills/night sweats, no nausea/vomiting/diarrhea, no chest pain, no palpitations, no shortness of breath, no abdominal pain, no focal weakness, no fatigue, no paresthesia.  No acute events overnight per nurse staff.  Not medically ready for discharge at this time until further investigation of fever of unknown origin, will monitor fever curve he will need to be fever free for 48 hours before stable.  Objective: Vitals:   09/21/23 0316 09/21/23 0534 09/21/23 0747 09/21/23 1127  BP: (!) 103/58 103/73 101/66 107/74  Pulse: 94 95 69 84  Resp: (!) 28 (!) 28  (!) 28  Temp: 99.2 F (37.3 C) 99.7 F (37.6 C) 98.6 F (37 C) 98.1 F (36.7 C)  TempSrc: Axillary Oral Axillary Oral  SpO2: 94% 95% 95% 97%  Weight:      Height:        Intake/Output Summary (Last 24 hours) at 09/21/2023 1150 Last data filed at 09/21/2023 0600 Gross per 24 hour  Intake 1550.13 ml  Output 1150 ml  Net 400.13 ml  Filed Weights   09/08/23 1601  Weight: 88.5 kg    Examination:  Physical Exam: GEN: NAD, alert; confused, garbled speech, chronically ill in appearance, appears older than stated age HEENT: PERRL, sclera clear, MMM, noted posterior scalp  laceration with scab overlying, staples removed 9/27  PULM: CTAB w/o wheezes/crackles, normal respiratory effort, on room air CV: IIR, normal rate, w/o M/G/R GI: abd soft, NTND, NABS MSK: no peripheral edema, moves all extremity independently NEURO: No focal neurological deficits other than speech Integumentary: Scalp laceration above, otherwise no concerning rashes/lesions/wounds noted on exposed skin surfaces.    Data Reviewed: I have personally reviewed following labs and imaging studies  CBC: Recent Labs  Lab 09/17/23 0658 09/20/23 1202 09/20/23 2255  WBC 6.8 6.5 6.4  HGB 13.2 13.4 13.9  HCT 37.3* 40.6 41.3  MCV 90.3 91.4 90.2  PLT 166 136* 138*   Basic Metabolic Panel: Recent Labs  Lab 09/15/23 0559 09/17/23 0658 09/20/23 1202  NA 136 136 142  K 4.0 3.6 3.4*  CL 102 105 110  CO2 23 22 22   GLUCOSE 196* 143* 215*  BUN 25* 21 31*  CREATININE 1.21 1.14 1.28*  CALCIUM 8.8* 8.8* 8.2*  MG 2.1 2.1 1.9  PHOS  --  3.6  --    GFR: Estimated Creatinine Clearance: 47.3 mL/min (A) (by C-G formula based on SCr of 1.28 mg/dL (H)). Liver Function Tests: Recent Labs  Lab 09/17/23 0658  AST 24  ALT 28  ALKPHOS 78  BILITOT 1.5*  PROT 6.2*  ALBUMIN 3.3*   No results for input(s): "LIPASE", "AMYLASE" in the last 168 hours. No results for input(s): "AMMONIA" in the last 168 hours. Coagulation Profile: No results for input(s): "INR", "PROTIME" in the last 168 hours. Cardiac Enzymes: No results for input(s): "CKTOTAL", "CKMB", "CKMBINDEX", "TROPONINI" in the last 168 hours. BNP (last 3 results) No results for input(s): "PROBNP" in the last 8760 hours. HbA1C: No results for input(s): "HGBA1C" in the last 72 hours. CBG: Recent Labs  Lab 09/20/23 1307 09/20/23 1651 09/20/23 2122 09/21/23 0611 09/21/23 1143  GLUCAP 200* 270* 357* 193* 206*   Lipid Profile: No results for input(s): "CHOL", "HDL", "LDLCALC", "TRIG", "CHOLHDL", "LDLDIRECT" in the last 72  hours. Thyroid Function Tests: No results for input(s): "TSH", "T4TOTAL", "FREET4", "T3FREE", "THYROIDAB" in the last 72 hours.  Anemia Panel: No results for input(s): "VITAMINB12", "FOLATE", "FERRITIN", "TIBC", "IRON", "RETICCTPCT" in the last 72 hours. Sepsis Labs: Recent Labs  Lab 09/20/23 2255 09/21/23 0241  PROCALCITON 8.44  --   LATICACIDVEN 2.7* 1.5    Recent Results (from the past 240 hour(s))  Culture, blood (Routine X 2) w Reflex to ID Panel     Status: None (Preliminary result)   Collection Time: 09/20/23 10:54 PM   Specimen: BLOOD RIGHT ARM  Result Value Ref Range Status   Specimen Description BLOOD RIGHT ARM  Final   Special Requests   Final    BOTTLES DRAWN AEROBIC AND ANAEROBIC Blood Culture adequate volume   Culture  Setup Time   Final    GRAM POSITIVE COCCI IN CLUSTERS IN BOTH AEROBIC AND ANAEROBIC BOTTLES Performed at [facility], [address], [place], [place] [address]    Culture GRAM POSITIVE COCCI  Final   Report Status PENDING  Incomplete  Culture, blood (Routine X 2) w Reflex to ID Panel     Status: None (Preliminary result)   Collection Time: 09/20/23 10:56 PM   Specimen: BLOOD RIGHT HAND  Result  Value Ref Range Status   Specimen Description BLOOD RIGHT HAND  Final   Special Requests   Final    BOTTLES DRAWN AEROBIC AND ANAEROBIC Blood Culture adequate volume   Culture  Setup Time   Final    GRAM POSITIVE COCCI IN CLUSTERS IN BOTH AEROBIC AND ANAEROBIC BOTTLES Organism ID to follow CRITICAL RESULT CALLED TO, READ BACK BY AND VERIFIED WITH: Mort Sawyers, AT 1144 09/21/23 Performed at [facility], [address], [place], [place] [address]    Culture GRAM POSITIVE COCCI  Final   Report Status PENDING  Incomplete  Blood Culture ID Panel (Reflexed)     Status: Abnormal   Collection Time: 09/20/23 10:56 PM  Result Value Ref Range Status   Enterococcus faecalis NOT DETECTED NOT DETECTED Final   Enterococcus Faecium NOT DETECTED  NOT DETECTED Final   Listeria monocytogenes NOT DETECTED NOT DETECTED Final   Staphylococcus species DETECTED (A) NOT DETECTED Final    Comment: CRITICAL RESULT CALLED TO, READ BACK BY AND VERIFIED WITH: A. UTOMWEN PHARMD, AT 1144 09/21/23    Staphylococcus aureus (BCID) DETECTED (A) NOT DETECTED Final    Comment: CRITICAL RESULT CALLED TO, READ BACK BY AND VERIFIED WITH: A. UTOMWEN PHARMD, AT 1144 09/21/23    Staphylococcus epidermidis NOT DETECTED NOT DETECTED Final   Staphylococcus lugdunensis NOT DETECTED NOT DETECTED Final   Streptococcus species NOT DETECTED NOT DETECTED Final   Streptococcus agalactiae NOT DETECTED NOT DETECTED Final   Streptococcus pneumoniae NOT DETECTED NOT DETECTED Final   Streptococcus pyogenes NOT DETECTED NOT DETECTED Final   A.calcoaceticus-baumannii NOT DETECTED NOT DETECTED Final   Bacteroides fragilis NOT DETECTED NOT DETECTED Final   Enterobacterales NOT DETECTED NOT DETECTED Final   Enterobacter cloacae complex NOT DETECTED NOT DETECTED Final   Escherichia coli NOT DETECTED NOT DETECTED Final   Klebsiella aerogenes NOT DETECTED NOT DETECTED Final   Klebsiella oxytoca NOT DETECTED NOT DETECTED Final   Klebsiella pneumoniae NOT DETECTED NOT DETECTED Final   Proteus species NOT DETECTED NOT DETECTED Final   Salmonella species NOT DETECTED NOT DETECTED Final   Serratia marcescens NOT DETECTED NOT DETECTED Final   Haemophilus influenzae NOT DETECTED NOT DETECTED Final   Neisseria meningitidis NOT DETECTED NOT DETECTED Final   Pseudomonas aeruginosa NOT DETECTED NOT DETECTED Final   Stenotrophomonas maltophilia NOT DETECTED NOT DETECTED Final   Candida albicans NOT DETECTED NOT DETECTED Final   Candida auris NOT DETECTED NOT DETECTED Final   Candida glabrata NOT DETECTED NOT DETECTED Final   Candida krusei NOT DETECTED NOT DETECTED Final   Candida parapsilosis NOT DETECTED NOT DETECTED Final   Candida tropicalis NOT DETECTED NOT DETECTED Final    Cryptococcus neoformans/gattii NOT DETECTED NOT DETECTED Final   Meth resistant mecA/C and MREJ NOT DETECTED NOT DETECTED Final    Comment: Performed at [facility], [address] [place], [place] [address]  MRSA Next Gen by PCR, Nasal     Status: None   Collection Time: 09/21/23  7:12 AM   Specimen: Nasal Mucosa; Nasal Swab  Result Value Ref Range Status   MRSA by PCR Next Gen NOT DETECTED NOT DETECTED Final    Comment: (NOTE) The GeneXpert MRSA Assay (FDA approved for NASAL specimens only), is one component of a comprehensive MRSA colonization surveillance program. It is not intended to diagnose MRSA infection nor to guide or monitor treatment for MRSA infections. Test performance is not FDA approved in patients less than 77 years old.  Performed at [facility], [address] [place], [place] [address]          Radiology Studies: CT HEAD WO CONTRAST ( )  Result Date: 09/21/2023 CLINICAL DATA:  Initial evaluation for acute delirium. EXAM: CT HEAD WITHOUT CONTRAST TECHNIQUE: Contiguous axial images were obtained from the base of the skull through the vertex without intravenous contrast. RADIATION DOSE REDUCTION: This exam was performed according to the departmental dose-optimization program which includes automated exposure control, adjustment of the mA and/or kV according to patient size and/or use of iterative reconstruction technique. COMPARISON:  Prior study from 09/16/2023. FINDINGS: Brain: Examination limited by patient positioning. Previously identified para falcine subdural hematoma is slightly decreased in size measuring 2 mm in thickness without mass effect. No significant residual blood products now seen about the previously identified small hemorrhagic contusion at the left vertex. No more than mild residual edema now seen at this location (series 3, image 36). No other new acute intracranial hemorrhage. No acute large vessel territory infarct. Underlying  atrophy with mild chronic small vessel ischemic disease, stable. No mass lesion or midline shift. No hydrocephalus. Vascular: Extensive intracranial arterial dolichoectasia again noted. Associated calcified atherosclerosis. No asymmetric hyperdense vessel. Skull: Scalp soft tissues and calvarium demonstrate no new finding. Sinuses/Orbits: Globes and orbital soft tissues within normal limits. Paranasal sinuses and mastoid air cells are largely clear. Other: None. IMPRESSION: 1. Slight interval decrease in size of small para falcine subdural hematoma, now measuring 2 mm in thickness without mass effect. 2. Interval resolution of previously identified small hemorrhagic contusion at the left vertex. No more than mild residual edema now seen at this location. 3. No other new acute intracranial abnormality. 4. Underlying atrophy with chronic small vessel ischemic disease and intracranial dolichoectasia, stable. Electronically Signed   By: Rise Mu M.D.   On: 09/21/2023 01:22   DG CHEST PORT 1 VIEW  Result Date: 09/20/2023 CLINICAL DATA:  Fever EXAM: PORTABLE CHEST 1 VIEW COMPARISON:  09/08/2023 FINDINGS: Heart is borderline in size. Aortic atherosclerosis. No confluent airspace opacities or effusions. No acute bony abnormality. IMPRESSION: No active disease. Electronically Signed   By: Charlett Nose M.D.   On: 09/20/2023 21:43        Scheduled Meds:  diltiazem  120 mg Oral Daily   feeding supplement  1 Container Oral TID BM   insulin aspart  0-5 Units Subcutaneous QHS   insulin aspart  0-9 Units Subcutaneous TID WC   irbesartan  300 mg Oral Daily   melatonin  6 mg Oral QHS   metoprolol tartrate  12.5 mg Oral BID   multivitamin with minerals  1 tablet Oral Daily   polyethylene glycol  17 g Oral BID   QUEtiapine  12.5 mg Oral QHS   Continuous Infusions:  aztreonam 2 g (09/21/23 0743)   metronidazole 500 mg (09/21/23 0911)   [START ON 09/22/2023] vancomycin        LOS: 4 days    Time  spent: 52 minutes spent on chart review, discussion with nursing staff, consultants, updating family and interview/physical exam; more than 50% of that time was spent in counseling and/or coordination of care.    Alvira Philips [place], DO [facility] Available via Epic secure chat 7am-7pm After these hours, please refer to coverage provider listed on [email or web address] 09/21/2023, 11:50 AM

## 2023-09-21 NOTE — Progress Notes (Signed)
Pharmacy Antibiotic Note  Mathew Simpson is a 87 y.o. male admitted on 09/08/2023 with SDH, now spiking fevers with elevated LA and PCT >> concern for sepsis.  Pharmacy has been consulted for vancomycin and aztreonam dosing.  Plan: Vancomycin 1750mg  IV x1 then 1250mg  IV Q24H. Goal AUC 400-550.  Expected AUC 480. Aztreonam 2g IV Q8H.  Height: 5\' 10"  (177.8 cm) Weight: 88.5 kg (195 lb) IBW/kg (Calculated) : 73  Temp (24hrs), Avg:99.6 F (37.6 C), Min:97.9 F (36.6 C), Max:103.1 F (39.5 C)  Recent Labs  Lab 09/15/23 0559 09/17/23 0658 09/20/23 1202 09/20/23 2255  WBC  --  6.8 6.5 6.4  CREATININE 1.21 1.14 1.28*  --   LATICACIDVEN  --   --   --  2.7*    Estimated Creatinine Clearance: 47.3 mL/min (A) (by C-G formula based on SCr of 1.28 mg/dL (H)).    Allergies  Allergen Reactions   Penicillins Anaphylaxis    Did it involve swelling of the face/tongue/throat, SOB, or low BP? Yes Did it involve sudden or severe rash/hives, skin peeling, or any reaction on the inside of your mouth or nose? Unknown Did you need to seek medical attention at a hospital or doctor's office? Yes When did it last happen?      50+ years If all above answers are "NO", may proceed with cephalosporin use.      Thank you for allowing pharmacy to be a part of this patient's care.  Vernard Gambles, PharmD, BCPS  09/21/2023 12:07 AM

## 2023-09-21 NOTE — Care Management Important Message (Signed)
Important Message  Patient Details  Name: Mathew Simpson MRN: [id number] Date of Birth: 06-03-1936   Important Message Given:  Yes - Medicare IM     Dorena Bodo 09/21/2023, 3:23 PM

## 2023-09-21 NOTE — Progress Notes (Signed)
PHARMACY - PHYSICIAN COMMUNICATION CRITICAL VALUE ALERT - BLOOD CULTURE IDENTIFICATION (BCID)  Mathew Simpson is an 87 y.o. male who presented to [facility] on 09/08/2023 with a chief complaint of fall, developed TBI with SDH and sepsis.   Assessment: 4 of 4 bottles positive for Staphylococcus aureus bacteremia. Source unknown, probable hospital acquired  Name of physician (or Provider) Contacted: [place], Alvira Philips, DO   Current antibiotics: aztreonam, metronidazole  Changes to prescribed antibiotics recommended: De-escalate aztreonam and flagyl to cefazolin  Recommendations accepted by provider  Results for orders placed or performed during the hospital encounter of 09/08/23  Blood Culture ID Panel (Reflexed) (Collected: 09/20/2023 10:56 PM)  Result Value Ref Range   Enterococcus faecalis NOT DETECTED NOT DETECTED   Enterococcus Faecium NOT DETECTED NOT DETECTED   Listeria monocytogenes NOT DETECTED NOT DETECTED   Staphylococcus species DETECTED (A) NOT DETECTED   Staphylococcus aureus (BCID) DETECTED (A) NOT DETECTED   Staphylococcus epidermidis NOT DETECTED NOT DETECTED   Staphylococcus lugdunensis NOT DETECTED NOT DETECTED   Streptococcus species NOT DETECTED NOT DETECTED   Streptococcus agalactiae NOT DETECTED NOT DETECTED   Streptococcus pneumoniae NOT DETECTED NOT DETECTED   Streptococcus pyogenes NOT DETECTED NOT DETECTED   A.calcoaceticus-baumannii NOT DETECTED NOT DETECTED   Bacteroides fragilis NOT DETECTED NOT DETECTED   Enterobacterales NOT DETECTED NOT DETECTED   Enterobacter cloacae complex NOT DETECTED NOT DETECTED   Escherichia coli NOT DETECTED NOT DETECTED   Klebsiella aerogenes NOT DETECTED NOT DETECTED   Klebsiella oxytoca NOT DETECTED NOT DETECTED   Klebsiella pneumoniae NOT DETECTED NOT DETECTED   Proteus species NOT DETECTED NOT DETECTED   Salmonella species NOT DETECTED NOT DETECTED   Serratia marcescens NOT DETECTED NOT DETECTED   Haemophilus influenzae  NOT DETECTED NOT DETECTED   Neisseria meningitidis NOT DETECTED NOT DETECTED   Pseudomonas aeruginosa NOT DETECTED NOT DETECTED   Stenotrophomonas maltophilia NOT DETECTED NOT DETECTED   Candida albicans NOT DETECTED NOT DETECTED   Candida auris NOT DETECTED NOT DETECTED   Candida glabrata NOT DETECTED NOT DETECTED   Candida krusei NOT DETECTED NOT DETECTED   Candida parapsilosis NOT DETECTED NOT DETECTED   Candida tropicalis NOT DETECTED NOT DETECTED   Cryptococcus neoformans/gattii NOT DETECTED NOT DETECTED   Meth resistant mecA/C and MREJ NOT DETECTED NOT DETECTED    Gad Aymond 09/21/2023  12:09 PM

## 2023-09-21 NOTE — Consult Note (Signed)
[facility]    Date of Admission:  09/08/2023     Total days of antibiotics   Cefazolin 9/30 >>                Reason for Consult: MSSA bacteremia     Referring Provider: vigilanz Primary Care Provider: [name], PA-C   Assessment: Mathew Simpson is a 87 y.o. male admitted with subdural hematoma after sustaining fall at home while on blood thinners.   Subdural Hematoma - -NSGY following - no surgical intervention needed, Cspine collar in place and 2 week hold on eliquis with outpatient follow up.  -Staples to scalp from laceration --> removed and healing nicely w/o any infection concern.  -repeat head CT today showed some improvement in hematomas    MSSA Bacteremia, Nosocomial -  -h/o B/L Knee TKRs --> both are normal and unimpressive today for any source of infection -No other hardware / cardiac devices.  -?from PIV that was > 75d old (removed 9/28).  -Order TTE (had one done 9/24 and no concerns noted there outside of LV dilation), repeat blood cultures tomorrow AM -Stop other abx and start cefazolin  -Follow for metastatic sits of infection  **mentioned some back pain but says it is nothing new and due to something else. Will follow    PCN Allergy -  -has received cefazolin before - will start this for treatment as we work this up.  -Will see if he would be a good person to amox challenge based on history.  -Stop aztreonam    Plan: Follow repeat blood cultures tomorrow 10/1 Change abx to cefazolin  FU TTE repeat  FU evolving sites of metastatic infection (back)    Principal Problem:   Bleeding in head following injury with loss of consciousness (HCC) Active Problems:   Essential hypertension   Chronic atrial fibrillation (HCC)   Subdural hematoma (HCC)   DM2 (diabetes mellitus, type 2) (HCC)   Neck injury   Fall   Scalp laceration   Bradycardia   Protein-calorie malnutrition, moderate (HCC)   SDH (subdural  hematoma) (HCC)    diltiazem  120 mg Oral Daily   feeding supplement  1 Container Oral TID BM   insulin aspart  0-5 Units Subcutaneous QHS   insulin aspart  0-9 Units Subcutaneous TID WC   irbesartan  300 mg Oral Daily   melatonin  6 mg Oral QHS   metoprolol tartrate  12.5 mg Oral BID   multivitamin with minerals  1 tablet Oral Daily   polyethylene glycol  17 g Oral BID   QUEtiapine  12.5 mg Oral QHS    HPI: Mathew Simpson is a 87 y.o. male admitted from home after sustaining a fall.   PMHx: afib on eliquis, HTN.   He is a bit lethargic on exam today but answered simple questions for me. His ex-spouse joined [place] in the visit and mentioned he was not having a great weekend and worried his brain bleed was worse. He was not waking up easily and not eating much.   No leukocytosis. Started requiring tylenol 9/26 once daily - On 9/28 he had fever > 101.9 and 103 on 9/29 - IV abx were started after pan cultured - blood cultures have since returned with growth for MSSA in all 4 bottles drawn.    Review of Systems: Review of Systems  Constitutional:  Positive for fever and malaise/fatigue. Negative for chills  and weight loss.  Musculoskeletal:  Positive for back pain.  Skin:  Negative for rash.    Past Medical History:  Diagnosis Date   Arthritis    back,   DJD (degenerative joint disease)    Dysrhythmia    A-fib   Glaucoma    Hypertension     Social History   Tobacco Use   Smoking status: Former    Current packs/day: 2.00    Average packs/day: 2.0 packs/day for 10.0 years (20.0 ttl pk-yrs)    Types: Cigarettes   Smokeless tobacco: Never   Tobacco comments:    quit in 20's  Vaping Use   Vaping status: Never Used  Substance Use Topics   Alcohol use: Not Currently   Drug use: Not Currently    Family History  Problem Relation Age of Onset   Hypertension Other    Allergies  Allergen Reactions   Penicillins Anaphylaxis    Did it involve swelling of the  face/tongue/throat, SOB, or low BP? Yes Did it involve sudden or severe rash/hives, skin peeling, or any reaction on the inside of your mouth or nose? Unknown Did you need to seek medical attention at a hospital or doctor's office? Yes When did it last happen?      50+ years If all above answers are "NO", may proceed with cephalosporin use.     OBJECTIVE: Blood pressure 107/74, pulse 84, temperature 98.1 F (36.7 C), temperature source Oral, resp. rate (!) 28, height 5\' 10"  (1.778 m), weight 88.5 kg, SpO2 97%.  Physical Exam Constitutional:      Comments: Lethargic, woke up startled    Eyes:     Pupils: Pupils are equal, round, and reactive to light.  Cardiovascular:     Rate and Rhythm: Normal rate and regular rhythm.     Heart sounds: No murmur heard. Pulmonary:     Effort: Pulmonary effort is normal.     Breath sounds: Normal breath sounds.  Abdominal:     General: Bowel sounds are normal. There is no distension.     Palpations: Abdomen is soft.  Musculoskeletal:        General: No tenderness.  Skin:    General: Skin is warm and dry.     Capillary Refill: Capillary refill takes less than 2 seconds.  Neurological:     Mental Status: He is oriented to person, place, and time.     Lab Results Lab Results  Component Value Date   WBC 6.4 09/20/2023   HGB 13.9 09/20/2023   HCT 41.3 09/20/2023   MCV 90.2 09/20/2023   PLT 138 (L) 09/20/2023    Lab Results  Component Value Date   CREATININE 1.28 (H) 09/20/2023   BUN 31 (H) 09/20/2023   NA 142 09/20/2023   K 3.4 (L) 09/20/2023   CL 110 09/20/2023   CO2 22 09/20/2023    Lab Results  Component Value Date   ALT 28 09/17/2023   AST 24 09/17/2023   ALKPHOS 78 09/17/2023   BILITOT 1.5 (H) 09/17/2023     Microbiology: Recent Results (from the past 240 hour(s))  Culture, blood (Routine X 2) w Reflex to ID Panel     Status: None (Preliminary result)   Collection Time: 09/20/23 10:54 PM   Specimen: BLOOD RIGHT ARM   Result Value Ref Range Status   Specimen Description BLOOD RIGHT ARM  Final   Special Requests   Final    BOTTLES DRAWN AEROBIC AND ANAEROBIC Blood Culture adequate  volume   Culture  Setup Time   Final    GRAM POSITIVE COCCI IN CLUSTERS IN BOTH AEROBIC AND ANAEROBIC BOTTLES Performed at [facility], [address] [place], [place] [address]    Culture GRAM POSITIVE COCCI  Final   Report Status PENDING  Incomplete  Culture, blood (Routine X 2) w Reflex to ID Panel     Status: None (Preliminary result)   Collection Time: 09/20/23 10:56 PM   Specimen: BLOOD RIGHT HAND  Result Value Ref Range Status   Specimen Description BLOOD RIGHT HAND  Final   Special Requests   Final    BOTTLES DRAWN AEROBIC AND ANAEROBIC Blood Culture adequate volume   Culture  Setup Time   Final    GRAM POSITIVE COCCI IN CLUSTERS IN BOTH AEROBIC AND ANAEROBIC BOTTLES Organism ID to follow CRITICAL RESULT CALLED TO, READ BACK BY AND VERIFIED WITH: Salina April PHARMD, AT 1144 09/21/23 Performed at [facility], [address], [place], [place] [address]    Culture GRAM POSITIVE COCCI  Final   Report Status PENDING  Incomplete  Blood Culture ID Panel (Reflexed)     Status: Abnormal   Collection Time: 09/20/23 10:56 PM  Result Value Ref Range Status   Enterococcus faecalis NOT DETECTED NOT DETECTED Final   Enterococcus Faecium NOT DETECTED NOT DETECTED Final   Listeria monocytogenes NOT DETECTED NOT DETECTED Final   Staphylococcus species DETECTED (A) NOT DETECTED Final    Comment: CRITICAL RESULT CALLED TO, READ BACK BY AND VERIFIED WITH: A. UTOMWEN PHARMD, AT 1144 09/21/23    Staphylococcus aureus (BCID) DETECTED (A) NOT DETECTED Final    Comment: CRITICAL RESULT CALLED TO, READ BACK BY AND VERIFIED WITH: A. UTOMWEN PHARMD, AT 1144 09/21/23    Staphylococcus epidermidis NOT DETECTED NOT DETECTED Final   Staphylococcus lugdunensis NOT DETECTED NOT DETECTED Final   Streptococcus species NOT  DETECTED NOT DETECTED Final   Streptococcus agalactiae NOT DETECTED NOT DETECTED Final   Streptococcus pneumoniae NOT DETECTED NOT DETECTED Final   Streptococcus pyogenes NOT DETECTED NOT DETECTED Final   A.calcoaceticus-baumannii NOT DETECTED NOT DETECTED Final   Bacteroides fragilis NOT DETECTED NOT DETECTED Final   Enterobacterales NOT DETECTED NOT DETECTED Final   Enterobacter cloacae complex NOT DETECTED NOT DETECTED Final   Escherichia coli NOT DETECTED NOT DETECTED Final   Klebsiella aerogenes NOT DETECTED NOT DETECTED Final   Klebsiella oxytoca NOT DETECTED NOT DETECTED Final   Klebsiella pneumoniae NOT DETECTED NOT DETECTED Final   Proteus species NOT DETECTED NOT DETECTED Final   Salmonella species NOT DETECTED NOT DETECTED Final   Serratia marcescens NOT DETECTED NOT DETECTED Final   Haemophilus influenzae NOT DETECTED NOT DETECTED Final   Neisseria meningitidis NOT DETECTED NOT DETECTED Final   Pseudomonas aeruginosa NOT DETECTED NOT DETECTED Final   Stenotrophomonas maltophilia NOT DETECTED NOT DETECTED Final   Candida albicans NOT DETECTED NOT DETECTED Final   Candida auris NOT DETECTED NOT DETECTED Final   Candida glabrata NOT DETECTED NOT DETECTED Final   Candida krusei NOT DETECTED NOT DETECTED Final   Candida parapsilosis NOT DETECTED NOT DETECTED Final   Candida tropicalis NOT DETECTED NOT DETECTED Final   Cryptococcus neoformans/gattii NOT DETECTED NOT DETECTED Final   Meth resistant mecA/C and MREJ NOT DETECTED NOT DETECTED Final    Comment: Performed at [facility], [address] [place], [place] [address]  MRSA Next Gen by PCR, Nasal     Status: None  Collection Time: 09/21/23  7:12 AM   Specimen: Nasal Mucosa; Nasal Swab  Result Value Ref Range Status   MRSA by PCR Next Gen NOT DETECTED NOT DETECTED Final    Comment: (NOTE) The GeneXpert MRSA Assay (FDA approved for NASAL specimens only), is one component of a comprehensive MRSA colonization  surveillance program. It is not intended to diagnose MRSA infection nor to guide or monitor treatment for MRSA infections. Test performance is not FDA approved in patients less than 25 years old. Performed at [facility], [address], [place], [place] [address]      Rexene Alberts, MSN, NP-C [facility]  [email or web address] Pager: [phone] Office: [phone] [facility] Main Line: [phone] *Secure Chat Communication Welcome   09/21/2023 12:05 PM

## 2023-09-22 ENCOUNTER — Inpatient Hospital Stay (HOSPITAL_COMMUNITY): Payer: Medicare HMO

## 2023-09-22 DIAGNOSIS — R7881 Bacteremia: Secondary | ICD-10-CM

## 2023-09-22 DIAGNOSIS — S065XAA Traumatic subdural hemorrhage with loss of consciousness status unknown, initial encounter: Secondary | ICD-10-CM | POA: Diagnosis not present

## 2023-09-22 DIAGNOSIS — B9561 Methicillin susceptible Staphylococcus aureus infection as the cause of diseases classified elsewhere: Secondary | ICD-10-CM

## 2023-09-22 LAB — COMPREHENSIVE METABOLIC PANEL
ALT: 52 U/L — ABNORMAL HIGH (ref 0–44)
AST: 53 U/L — ABNORMAL HIGH (ref 15–41)
Albumin: 2.3 g/dL — ABNORMAL LOW (ref 3.5–5.0)
Alkaline Phosphatase: 70 U/L (ref 38–126)
Anion gap: 10 (ref 5–15)
BUN: 31 mg/dL — ABNORMAL HIGH (ref 8–23)
CO2: 19 mmol/L — ABNORMAL LOW (ref 22–32)
Calcium: 8 mg/dL — ABNORMAL LOW (ref 8.9–10.3)
Chloride: 118 mmol/L — ABNORMAL HIGH (ref 98–111)
Creatinine, Ser: 1.33 mg/dL — ABNORMAL HIGH (ref 0.61–1.24)
GFR, Estimated: 52 mL/min — ABNORMAL LOW (ref >60)
Glucose, Bld: 127 mg/dL — ABNORMAL HIGH (ref 70–99)
Potassium: 3.6 mmol/L (ref 3.5–5.1)
Sodium: 147 mmol/L — ABNORMAL HIGH (ref 135–145)
Total Bilirubin: 0.9 mg/dL (ref 0.3–1.2)
Total Protein: 5.6 g/dL — ABNORMAL LOW (ref 6.5–8.1)

## 2023-09-22 LAB — CBC
HCT: 37.9 % — ABNORMAL LOW (ref 39.0–52.0)
Hemoglobin: 12.7 g/dL — ABNORMAL LOW (ref 13.0–17.0)
MCH: 30.9 pg (ref 26.0–34.0)
MCHC: 33.5 g/dL (ref 30.0–36.0)
MCV: 92.2 fL (ref 80.0–100.0)
Platelets: 101 10*3/uL — ABNORMAL LOW (ref 150–400)
RBC: 4.11 MIL/uL — ABNORMAL LOW (ref 4.22–5.81)
RDW: 14.6 % (ref 11.5–15.5)
WBC: 6.9 10*3/uL (ref 4.0–10.5)
nRBC: 0 % (ref 0.0–0.2)

## 2023-09-22 LAB — PROCALCITONIN: Procalcitonin: 8.09 ng/mL

## 2023-09-22 LAB — MAGNESIUM: Magnesium: 2.2 mg/dL (ref 1.7–2.4)

## 2023-09-22 LAB — GLUCOSE, CAPILLARY
Glucose-Capillary: 144 mg/dL — ABNORMAL HIGH (ref 70–99)
Glucose-Capillary: 198 mg/dL — ABNORMAL HIGH (ref 70–99)
Glucose-Capillary: 155 mg/dL — ABNORMAL HIGH (ref 70–99)
Glucose-Capillary: 141 mg/dL — ABNORMAL HIGH (ref 70–99)

## 2023-09-22 LAB — LACTIC ACID, PLASMA: Lactic Acid, Venous: 1.4 mmol/L (ref 0.5–1.9)

## 2023-09-22 LAB — PHOSPHORUS: Phosphorus: 2.6 mg/dL (ref 2.5–4.6)

## 2023-09-22 NOTE — Progress Notes (Signed)
ECHOCARDIOGRAM REPORT   Patient Name:   Mathew Simpson Date of Exam: 09/21/2023 Medical Rec #:  [id number]     Height:       70.0 in Accession #:    [id number]    Weight:       195.0 lb Date of Birth:  [birth year]      BSA:          2.065 m Patient Age:    87 years      BP:           107/74 mmHg Patient Gender: M             HR:           75 bpm. Exam Location:  Inpatient Procedure: 2D Echo, Cardiac Doppler and Color Doppler Indications:    Bacteremia R78.81  History:        Patient has prior history of Echocardiogram examinations, most                 recent 09/15/2023. Arrythmias:Atrial Fibrillation; Risk                 Factors:Diabetes, Hypertension and Former Smoker.  Sonographer:    Mathew Simpson RVT RCS Referring Phys: Mathew Simpson  Sonographer Comments: Technically challenging study due to limited acoustic windows, Technically difficult study due to poor echo windows, suboptimal parasternal window and suboptimal apical window. Image acquisition challenging due to uncooperative patient and Image acquisition challenging due to respiratory motion. Patient supine. Unable  to complete study; patient kept pushing probe away. IMPRESSIONS  1. Left ventricular ejection fraction, by estimation, is 55 to 60%. The left ventricle has normal function. The left ventricle has no regional wall motion abnormalities. There is moderate concentric left ventricular hypertrophy. Left ventricular diastolic parameters are indeterminate.  2. Peak RV-RA gradient 18 mmHg. Right ventricular systolic function is mildly reduced. The right ventricular size is normal.  3. Left atrial size was severely dilated.  4. Right atrial size was mildly dilated.  5. The mitral valve is normal in structure. No evidence of mitral valve regurgitation. No evidence of mitral stenosis.  6. The aortic valve is tricuspid. Aortic valve regurgitation is trivial.  7. Aortic dilatation noted. There is moderate dilatation of the ascending aorta, measuring 45 mm.  8. IVC not visualized.  9. The patient was in atrial fibrillation. 10. Technically difficult study. FINDINGS  Left Ventricle: Left ventricular ejection fraction, by estimation, is 55 to 60%. The left ventricle has normal function. The left ventricle has no regional wall motion abnormalities. The left ventricular internal cavity size was normal in size. There is  moderate concentric left ventricular hypertrophy. Left ventricular diastolic parameters are indeterminate. Right Ventricle: Peak RV-RA gradient 18 mmHg. The right ventricular size is normal. No increase in right ventricular wall thickness. Right ventricular systolic function is mildly reduced. Left Atrium: Left atrial size was severely dilated. Right Atrium: Right atrial size was mildly dilated. Pericardium: There is no evidence of pericardial effusion. Mitral Valve: The mitral valve is normal in structure. Mild mitral annular calcification. No evidence of mitral valve regurgitation. No evidence of mitral valve stenosis. Tricuspid Valve: The tricuspid valve is normal in structure. Tricuspid valve regurgitation is  trivial. Aortic Valve: The aortic valve is tricuspid. Aortic valve regurgitation is trivial. Aortic valve mean gradient measures 2.0 mmHg. Aortic valve peak gradient measures 3.6 mmHg. Aortic valve area, by VTI measures 3.43 cm. Pulmonic Valve: The pulmonic valve was not well visualized.  PROGRESS NOTE    Mathew Simpson  WUJ:[id number] DOB: 1936/01/05 DOA: 09/08/2023 PCP: [name], PA-C    Brief Narrative:   Mathew Simpson with past medical history significant for persistent atrial fibrillation on Eliquis, HTN, type 2 diabetes mellitus, vertigo who presented to [facility] ED on 9/17 via EMS from home after mechanical fall at home.  Family believes he may have fallen backwards from the top of the stairs with suspected loss of consciousness.  Found by neighbor.  EMS reports since fall he was having dizziness, nausea, vomiting and saying repetitive questions.  Complicated by Eliquis use, took the medication earlier in the morning.  In the ED, temperature 97.5 F, HR 68, RR 18, BP 149/93, SpO2 94% on room air.  WBC 6.0, hemoglobin 12.7, platelet count 151.  Sodium 138, potassium 4.0, chloride 102, CO2 27, glucose 127, BUN 19, creat 1.30.  AST 24, ALT 19, total bilirubin 1.3.  EtOH level less than 10.  Procalcitonin less than 0.10.  CK 75.  Chest x-ray with low lung volumes, prominent interstitial markings, no acute findings.  Pelvis x-ray negative.  CT head and C-spine without contrast with 3 mm subdural hematoma anterior falx, no midline shift, 6 mm hemorrhagic contusion posterior left frontal lobe, asymmetric widening of the space between the dens and left lateral mass of the C1 relative the right lateral mass, suspicious for worry as some fixation.  Patient was seen by neurosurgery, follow-up CT scans demonstrate stable appearance small anterior falcine subdural and left frontal contusion does not require any acute intervention; Dr. Conchita Paris; with recommendation of holding Eliquis for 2 weeks, no requirement of cervical immobilization and outpatient follow-up in neurosurg clinic as needed.  TRH consulted for admission.  Assessment & Plan:   Subdural hematoma Traumatic brain injury Left frontal contusion  Scalp laceration Mechanical fall Patient presenting via EMS  after a mechanical fall at home striking his head with suspected loss of consciousness.  CT head and C-spine without contrast on admission  with 3 mm subdural hematoma anterior falx, no midline shift, 6 mm hemorrhagic contusion posterior left frontal lobe, asymmetric widening of the space between the dens and left lateral mass of the C1 relative the right lateral mass, suspicious for worry as some fixation.  Patient was seen by neurosurgery, follow-up CT scans demonstrate stable appearance small anterior falcine subdural and left frontal contusion does not require any acute intervention; Dr. Conchita Paris; with recommendation of holding Eliquis for 2 weeks, no requirement of cervical immobilization and outpatient follow-up in neurosurg clinic as needed.  Staples removed on 09/18/2023. -- CT head w/o contrast 9/25 w/ stable SDH -- CT head w/o contrast 9/29: Interval decrease SDH 2 mm without mass effect, resolution small hemorrhagic contusion, otherwise negative -- Continue to hold Eliquis x 2 weeks; can restart now but will continue to hold as may need TEE -- Continue PT/OT efforts -- Fall precautions  MSSA Bacteremia Overnight patient with temperature 103.1.  Remains confused.  Repeat head CT shows interval decrease in subdural hematoma and resolution of hemorrhagic contusion.  WBC count within normal limits.  Urinalysis unrevealing, chest x-ray with no active cardiopulmonary disease process.  Lactic acid 2.7, procalcitonin 8.44.  CT chest/abdomen/pelvis with contrast with no significant findings.  TTE with LVEF 55-60%, RV systolic function mildly decreased, biatrial enlargement, aortic diet lesion 45 mm, no notation of valvular vegetation although technically difficult study. --Infectious disease following, appreciate assistance -- Blood cultures x 2: + GPC's  PROGRESS NOTE    Mathew Simpson  WUJ:[id number] DOB: 1936/01/05 DOA: 09/08/2023 PCP: [name], PA-C    Brief Narrative:   Mathew Simpson with past medical history significant for persistent atrial fibrillation on Eliquis, HTN, type 2 diabetes mellitus, vertigo who presented to [facility] ED on 9/17 via EMS from home after mechanical fall at home.  Family believes he may have fallen backwards from the top of the stairs with suspected loss of consciousness.  Found by neighbor.  EMS reports since fall he was having dizziness, nausea, vomiting and saying repetitive questions.  Complicated by Eliquis use, took the medication earlier in the morning.  In the ED, temperature 97.5 F, HR 68, RR 18, BP 149/93, SpO2 94% on room air.  WBC 6.0, hemoglobin 12.7, platelet count 151.  Sodium 138, potassium 4.0, chloride 102, CO2 27, glucose 127, BUN 19, creat 1.30.  AST 24, ALT 19, total bilirubin 1.3.  EtOH level less than 10.  Procalcitonin less than 0.10.  CK 75.  Chest x-ray with low lung volumes, prominent interstitial markings, no acute findings.  Pelvis x-ray negative.  CT head and C-spine without contrast with 3 mm subdural hematoma anterior falx, no midline shift, 6 mm hemorrhagic contusion posterior left frontal lobe, asymmetric widening of the space between the dens and left lateral mass of the C1 relative the right lateral mass, suspicious for worry as some fixation.  Patient was seen by neurosurgery, follow-up CT scans demonstrate stable appearance small anterior falcine subdural and left frontal contusion does not require any acute intervention; Dr. Conchita Paris; with recommendation of holding Eliquis for 2 weeks, no requirement of cervical immobilization and outpatient follow-up in neurosurg clinic as needed.  TRH consulted for admission.  Assessment & Plan:   Subdural hematoma Traumatic brain injury Left frontal contusion  Scalp laceration Mechanical fall Patient presenting via EMS  after a mechanical fall at home striking his head with suspected loss of consciousness.  CT head and C-spine without contrast on admission  with 3 mm subdural hematoma anterior falx, no midline shift, 6 mm hemorrhagic contusion posterior left frontal lobe, asymmetric widening of the space between the dens and left lateral mass of the C1 relative the right lateral mass, suspicious for worry as some fixation.  Patient was seen by neurosurgery, follow-up CT scans demonstrate stable appearance small anterior falcine subdural and left frontal contusion does not require any acute intervention; Dr. Conchita Paris; with recommendation of holding Eliquis for 2 weeks, no requirement of cervical immobilization and outpatient follow-up in neurosurg clinic as needed.  Staples removed on 09/18/2023. -- CT head w/o contrast 9/25 w/ stable SDH -- CT head w/o contrast 9/29: Interval decrease SDH 2 mm without mass effect, resolution small hemorrhagic contusion, otherwise negative -- Continue to hold Eliquis x 2 weeks; can restart now but will continue to hold as may need TEE -- Continue PT/OT efforts -- Fall precautions  MSSA Bacteremia Overnight patient with temperature 103.1.  Remains confused.  Repeat head CT shows interval decrease in subdural hematoma and resolution of hemorrhagic contusion.  WBC count within normal limits.  Urinalysis unrevealing, chest x-ray with no active cardiopulmonary disease process.  Lactic acid 2.7, procalcitonin 8.44.  CT chest/abdomen/pelvis with contrast with no significant findings.  TTE with LVEF 55-60%, RV systolic function mildly decreased, biatrial enlargement, aortic diet lesion 45 mm, no notation of valvular vegetation although technically difficult study. --Infectious disease following, appreciate assistance -- Blood cultures x 2: + GPC's  ECHOCARDIOGRAM REPORT   Patient Name:   Mathew Simpson Date of Exam: 09/21/2023 Medical Rec #:  [id number]     Height:       70.0 in Accession #:    [id number]    Weight:       195.0 lb Date of Birth:  [birth year]      BSA:          2.065 m Patient Age:    87 years      BP:           107/74 mmHg Patient Gender: M             HR:           75 bpm. Exam Location:  Inpatient Procedure: 2D Echo, Cardiac Doppler and Color Doppler Indications:    Bacteremia R78.81  History:        Patient has prior history of Echocardiogram examinations, most                 recent 09/15/2023. Arrythmias:Atrial Fibrillation; Risk                 Factors:Diabetes, Hypertension and Former Smoker.  Sonographer:    Mathew Simpson RVT RCS Referring Phys: Mathew Simpson  Sonographer Comments: Technically challenging study due to limited acoustic windows, Technically difficult study due to poor echo windows, suboptimal parasternal window and suboptimal apical window. Image acquisition challenging due to uncooperative patient and Image acquisition challenging due to respiratory motion. Patient supine. Unable  to complete study; patient kept pushing probe away. IMPRESSIONS  1. Left ventricular ejection fraction, by estimation, is 55 to 60%. The left ventricle has normal function. The left ventricle has no regional wall motion abnormalities. There is moderate concentric left ventricular hypertrophy. Left ventricular diastolic parameters are indeterminate.  2. Peak RV-RA gradient 18 mmHg. Right ventricular systolic function is mildly reduced. The right ventricular size is normal.  3. Left atrial size was severely dilated.  4. Right atrial size was mildly dilated.  5. The mitral valve is normal in structure. No evidence of mitral valve regurgitation. No evidence of mitral stenosis.  6. The aortic valve is tricuspid. Aortic valve regurgitation is trivial.  7. Aortic dilatation noted. There is moderate dilatation of the ascending aorta, measuring 45 mm.  8. IVC not visualized.  9. The patient was in atrial fibrillation. 10. Technically difficult study. FINDINGS  Left Ventricle: Left ventricular ejection fraction, by estimation, is 55 to 60%. The left ventricle has normal function. The left ventricle has no regional wall motion abnormalities. The left ventricular internal cavity size was normal in size. There is  moderate concentric left ventricular hypertrophy. Left ventricular diastolic parameters are indeterminate. Right Ventricle: Peak RV-RA gradient 18 mmHg. The right ventricular size is normal. No increase in right ventricular wall thickness. Right ventricular systolic function is mildly reduced. Left Atrium: Left atrial size was severely dilated. Right Atrium: Right atrial size was mildly dilated. Pericardium: There is no evidence of pericardial effusion. Mitral Valve: The mitral valve is normal in structure. Mild mitral annular calcification. No evidence of mitral valve regurgitation. No evidence of mitral valve stenosis. Tricuspid Valve: The tricuspid valve is normal in structure. Tricuspid valve regurgitation is  trivial. Aortic Valve: The aortic valve is tricuspid. Aortic valve regurgitation is trivial. Aortic valve mean gradient measures 2.0 mmHg. Aortic valve peak gradient measures 3.6 mmHg. Aortic valve area, by VTI measures 3.43 cm. Pulmonic Valve: The pulmonic valve was not well visualized.  PROGRESS NOTE    Mathew Simpson  WUJ:[id number] DOB: 1936/01/05 DOA: 09/08/2023 PCP: [name], PA-C    Brief Narrative:   Mathew Simpson with past medical history significant for persistent atrial fibrillation on Eliquis, HTN, type 2 diabetes mellitus, vertigo who presented to [facility] ED on 9/17 via EMS from home after mechanical fall at home.  Family believes he may have fallen backwards from the top of the stairs with suspected loss of consciousness.  Found by neighbor.  EMS reports since fall he was having dizziness, nausea, vomiting and saying repetitive questions.  Complicated by Eliquis use, took the medication earlier in the morning.  In the ED, temperature 97.5 F, HR 68, RR 18, BP 149/93, SpO2 94% on room air.  WBC 6.0, hemoglobin 12.7, platelet count 151.  Sodium 138, potassium 4.0, chloride 102, CO2 27, glucose 127, BUN 19, creat 1.30.  AST 24, ALT 19, total bilirubin 1.3.  EtOH level less than 10.  Procalcitonin less than 0.10.  CK 75.  Chest x-ray with low lung volumes, prominent interstitial markings, no acute findings.  Pelvis x-ray negative.  CT head and C-spine without contrast with 3 mm subdural hematoma anterior falx, no midline shift, 6 mm hemorrhagic contusion posterior left frontal lobe, asymmetric widening of the space between the dens and left lateral mass of the C1 relative the right lateral mass, suspicious for worry as some fixation.  Patient was seen by neurosurgery, follow-up CT scans demonstrate stable appearance small anterior falcine subdural and left frontal contusion does not require any acute intervention; Dr. Conchita Paris; with recommendation of holding Eliquis for 2 weeks, no requirement of cervical immobilization and outpatient follow-up in neurosurg clinic as needed.  TRH consulted for admission.  Assessment & Plan:   Subdural hematoma Traumatic brain injury Left frontal contusion  Scalp laceration Mechanical fall Patient presenting via EMS  after a mechanical fall at home striking his head with suspected loss of consciousness.  CT head and C-spine without contrast on admission  with 3 mm subdural hematoma anterior falx, no midline shift, 6 mm hemorrhagic contusion posterior left frontal lobe, asymmetric widening of the space between the dens and left lateral mass of the C1 relative the right lateral mass, suspicious for worry as some fixation.  Patient was seen by neurosurgery, follow-up CT scans demonstrate stable appearance small anterior falcine subdural and left frontal contusion does not require any acute intervention; Dr. Conchita Paris; with recommendation of holding Eliquis for 2 weeks, no requirement of cervical immobilization and outpatient follow-up in neurosurg clinic as needed.  Staples removed on 09/18/2023. -- CT head w/o contrast 9/25 w/ stable SDH -- CT head w/o contrast 9/29: Interval decrease SDH 2 mm without mass effect, resolution small hemorrhagic contusion, otherwise negative -- Continue to hold Eliquis x 2 weeks; can restart now but will continue to hold as may need TEE -- Continue PT/OT efforts -- Fall precautions  MSSA Bacteremia Overnight patient with temperature 103.1.  Remains confused.  Repeat head CT shows interval decrease in subdural hematoma and resolution of hemorrhagic contusion.  WBC count within normal limits.  Urinalysis unrevealing, chest x-ray with no active cardiopulmonary disease process.  Lactic acid 2.7, procalcitonin 8.44.  CT chest/abdomen/pelvis with contrast with no significant findings.  TTE with LVEF 55-60%, RV systolic function mildly decreased, biatrial enlargement, aortic diet lesion 45 mm, no notation of valvular vegetation although technically difficult study. --Infectious disease following, appreciate assistance -- Blood cultures x 2: + GPC's  ECHOCARDIOGRAM REPORT   Patient Name:   Mathew Simpson Date of Exam: 09/21/2023 Medical Rec #:  [id number]     Height:       70.0 in Accession #:    [id number]    Weight:       195.0 lb Date of Birth:  [birth year]      BSA:          2.065 m Patient Age:    87 years      BP:           107/74 mmHg Patient Gender: M             HR:           75 bpm. Exam Location:  Inpatient Procedure: 2D Echo, Cardiac Doppler and Color Doppler Indications:    Bacteremia R78.81  History:        Patient has prior history of Echocardiogram examinations, most                 recent 09/15/2023. Arrythmias:Atrial Fibrillation; Risk                 Factors:Diabetes, Hypertension and Former Smoker.  Sonographer:    Mathew Simpson RVT RCS Referring Phys: Mathew Simpson  Sonographer Comments: Technically challenging study due to limited acoustic windows, Technically difficult study due to poor echo windows, suboptimal parasternal window and suboptimal apical window. Image acquisition challenging due to uncooperative patient and Image acquisition challenging due to respiratory motion. Patient supine. Unable  to complete study; patient kept pushing probe away. IMPRESSIONS  1. Left ventricular ejection fraction, by estimation, is 55 to 60%. The left ventricle has normal function. The left ventricle has no regional wall motion abnormalities. There is moderate concentric left ventricular hypertrophy. Left ventricular diastolic parameters are indeterminate.  2. Peak RV-RA gradient 18 mmHg. Right ventricular systolic function is mildly reduced. The right ventricular size is normal.  3. Left atrial size was severely dilated.  4. Right atrial size was mildly dilated.  5. The mitral valve is normal in structure. No evidence of mitral valve regurgitation. No evidence of mitral stenosis.  6. The aortic valve is tricuspid. Aortic valve regurgitation is trivial.  7. Aortic dilatation noted. There is moderate dilatation of the ascending aorta, measuring 45 mm.  8. IVC not visualized.  9. The patient was in atrial fibrillation. 10. Technically difficult study. FINDINGS  Left Ventricle: Left ventricular ejection fraction, by estimation, is 55 to 60%. The left ventricle has normal function. The left ventricle has no regional wall motion abnormalities. The left ventricular internal cavity size was normal in size. There is  moderate concentric left ventricular hypertrophy. Left ventricular diastolic parameters are indeterminate. Right Ventricle: Peak RV-RA gradient 18 mmHg. The right ventricular size is normal. No increase in right ventricular wall thickness. Right ventricular systolic function is mildly reduced. Left Atrium: Left atrial size was severely dilated. Right Atrium: Right atrial size was mildly dilated. Pericardium: There is no evidence of pericardial effusion. Mitral Valve: The mitral valve is normal in structure. Mild mitral annular calcification. No evidence of mitral valve regurgitation. No evidence of mitral valve stenosis. Tricuspid Valve: The tricuspid valve is normal in structure. Tricuspid valve regurgitation is  trivial. Aortic Valve: The aortic valve is tricuspid. Aortic valve regurgitation is trivial. Aortic valve mean gradient measures 2.0 mmHg. Aortic valve peak gradient measures 3.6 mmHg. Aortic valve area, by VTI measures 3.43 cm. Pulmonic Valve: The pulmonic valve was not well visualized.  ECHOCARDIOGRAM REPORT   Patient Name:   Mathew Simpson Date of Exam: 09/21/2023 Medical Rec #:  [id number]     Height:       70.0 in Accession #:    [id number]    Weight:       195.0 lb Date of Birth:  [birth year]      BSA:          2.065 m Patient Age:    87 years      BP:           107/74 mmHg Patient Gender: M             HR:           75 bpm. Exam Location:  Inpatient Procedure: 2D Echo, Cardiac Doppler and Color Doppler Indications:    Bacteremia R78.81  History:        Patient has prior history of Echocardiogram examinations, most                 recent 09/15/2023. Arrythmias:Atrial Fibrillation; Risk                 Factors:Diabetes, Hypertension and Former Smoker.  Sonographer:    Mathew Simpson RVT RCS Referring Phys: Mathew Simpson  Sonographer Comments: Technically challenging study due to limited acoustic windows, Technically difficult study due to poor echo windows, suboptimal parasternal window and suboptimal apical window. Image acquisition challenging due to uncooperative patient and Image acquisition challenging due to respiratory motion. Patient supine. Unable  to complete study; patient kept pushing probe away. IMPRESSIONS  1. Left ventricular ejection fraction, by estimation, is 55 to 60%. The left ventricle has normal function. The left ventricle has no regional wall motion abnormalities. There is moderate concentric left ventricular hypertrophy. Left ventricular diastolic parameters are indeterminate.  2. Peak RV-RA gradient 18 mmHg. Right ventricular systolic function is mildly reduced. The right ventricular size is normal.  3. Left atrial size was severely dilated.  4. Right atrial size was mildly dilated.  5. The mitral valve is normal in structure. No evidence of mitral valve regurgitation. No evidence of mitral stenosis.  6. The aortic valve is tricuspid. Aortic valve regurgitation is trivial.  7. Aortic dilatation noted. There is moderate dilatation of the ascending aorta, measuring 45 mm.  8. IVC not visualized.  9. The patient was in atrial fibrillation. 10. Technically difficult study. FINDINGS  Left Ventricle: Left ventricular ejection fraction, by estimation, is 55 to 60%. The left ventricle has normal function. The left ventricle has no regional wall motion abnormalities. The left ventricular internal cavity size was normal in size. There is  moderate concentric left ventricular hypertrophy. Left ventricular diastolic parameters are indeterminate. Right Ventricle: Peak RV-RA gradient 18 mmHg. The right ventricular size is normal. No increase in right ventricular wall thickness. Right ventricular systolic function is mildly reduced. Left Atrium: Left atrial size was severely dilated. Right Atrium: Right atrial size was mildly dilated. Pericardium: There is no evidence of pericardial effusion. Mitral Valve: The mitral valve is normal in structure. Mild mitral annular calcification. No evidence of mitral valve regurgitation. No evidence of mitral valve stenosis. Tricuspid Valve: The tricuspid valve is normal in structure. Tricuspid valve regurgitation is  trivial. Aortic Valve: The aortic valve is tricuspid. Aortic valve regurgitation is trivial. Aortic valve mean gradient measures 2.0 mmHg. Aortic valve peak gradient measures 3.6 mmHg. Aortic valve area, by VTI measures 3.43 cm. Pulmonic Valve: The pulmonic valve was not well visualized.  ECHOCARDIOGRAM REPORT   Patient Name:   Mathew Simpson Date of Exam: 09/21/2023 Medical Rec #:  [id number]     Height:       70.0 in Accession #:    [id number]    Weight:       195.0 lb Date of Birth:  [birth year]      BSA:          2.065 m Patient Age:    87 years      BP:           107/74 mmHg Patient Gender: M             HR:           75 bpm. Exam Location:  Inpatient Procedure: 2D Echo, Cardiac Doppler and Color Doppler Indications:    Bacteremia R78.81  History:        Patient has prior history of Echocardiogram examinations, most                 recent 09/15/2023. Arrythmias:Atrial Fibrillation; Risk                 Factors:Diabetes, Hypertension and Former Smoker.  Sonographer:    Mathew Simpson RVT RCS Referring Phys: Mathew Simpson  Sonographer Comments: Technically challenging study due to limited acoustic windows, Technically difficult study due to poor echo windows, suboptimal parasternal window and suboptimal apical window. Image acquisition challenging due to uncooperative patient and Image acquisition challenging due to respiratory motion. Patient supine. Unable  to complete study; patient kept pushing probe away. IMPRESSIONS  1. Left ventricular ejection fraction, by estimation, is 55 to 60%. The left ventricle has normal function. The left ventricle has no regional wall motion abnormalities. There is moderate concentric left ventricular hypertrophy. Left ventricular diastolic parameters are indeterminate.  2. Peak RV-RA gradient 18 mmHg. Right ventricular systolic function is mildly reduced. The right ventricular size is normal.  3. Left atrial size was severely dilated.  4. Right atrial size was mildly dilated.  5. The mitral valve is normal in structure. No evidence of mitral valve regurgitation. No evidence of mitral stenosis.  6. The aortic valve is tricuspid. Aortic valve regurgitation is trivial.  7. Aortic dilatation noted. There is moderate dilatation of the ascending aorta, measuring 45 mm.  8. IVC not visualized.  9. The patient was in atrial fibrillation. 10. Technically difficult study. FINDINGS  Left Ventricle: Left ventricular ejection fraction, by estimation, is 55 to 60%. The left ventricle has normal function. The left ventricle has no regional wall motion abnormalities. The left ventricular internal cavity size was normal in size. There is  moderate concentric left ventricular hypertrophy. Left ventricular diastolic parameters are indeterminate. Right Ventricle: Peak RV-RA gradient 18 mmHg. The right ventricular size is normal. No increase in right ventricular wall thickness. Right ventricular systolic function is mildly reduced. Left Atrium: Left atrial size was severely dilated. Right Atrium: Right atrial size was mildly dilated. Pericardium: There is no evidence of pericardial effusion. Mitral Valve: The mitral valve is normal in structure. Mild mitral annular calcification. No evidence of mitral valve regurgitation. No evidence of mitral valve stenosis. Tricuspid Valve: The tricuspid valve is normal in structure. Tricuspid valve regurgitation is  trivial. Aortic Valve: The aortic valve is tricuspid. Aortic valve regurgitation is trivial. Aortic valve mean gradient measures 2.0 mmHg. Aortic valve peak gradient measures 3.6 mmHg. Aortic valve area, by VTI measures 3.43 cm. Pulmonic Valve: The pulmonic valve was not well visualized.

## 2023-09-22 NOTE — Progress Notes (Signed)
Physical Therapy Treatment Patient Details Name: Mathew Simpson MRN: [id number] DOB: Aug 15, 1936 Today's Date: 09/22/2023   History of Present Illness Mathew Simpson is a 87 y.o. male admitted 9/17 to the hospital after presenting to the emergency department after a fall.  CT scan was therefore obtained which demonstrated a small intracranial hemorrhage in the left frontal/parietal region. C-spine CT was worrisome for instability but pt seen by neurosurgery and repeat CT demonstrated stable on no requirement for cervical immobilization. Pt with worsening confusion but CT on 9/29 demonstrated decreased size in SDH.  Pt did have fever 9/29 - ongoing workup, noted possible need for TEE.  PMH for hypertension, glaucoma, hx of B TKR.    PT Comments  Pt with significant decline in mobility since last visit on 9/26.  Pt with increased confusion and L knee pain that limited.  Noted that L LE xray performed but awaiting reading - pt cleared for PT per Dr [place], but pt was not able to tolerate attempts to stand or flexion of L knee.  Therapist assisted and guarded L knee throughout session due to pain.  Noted in MD notes -aware of confusion, pt with fever on 9/29, may need TEE.  Today, pt needing mod -max of 2 for transfers to EOB and max cues/encouragement.  PT agreed on L LE exercises or standing due to knee pain but pt self limiting in all other activities at EOB.  Will continue to progress as able - pending L knee xray result.  Also, noted that insurance initially denied SNF but family appealed and denial was overturned.  Continue to recommend Patient will benefit from continued inpatient follow up therapy, <3 hours/day at d/c. Noted in prior therapy note that dizziness had resolved - did not address today as pt too confused to provide input.    If plan is discharge home, recommend the following: Two people to help with walking and/or transfers;Two people to help with bathing/dressing/bathroom;Assist for  transportation;Help with stairs or ramp for entrance;Assistance with cooking/housework   Can travel by private vehicle     No  Equipment Recommendations  Other (comment) (defer to post acute)    Recommendations for Other Services       Precautions / Restrictions Precautions Precautions: Fall Precaution Comments: likely BPPV component Restrictions Weight Bearing Restrictions: No     Mobility  Bed Mobility Overal bed mobility: Needs Assistance Bed Mobility: Sidelying to Sit, Sit to Sidelying, Rolling Rolling: Mod assist, +2 for physical assistance   Supine to sit: Max assist, +2 for physical assistance   Sit to sidelying: Mod assist, +2 for physical assistance General bed mobility comments: Max verbal cues for sequencing.  Pt with significant L knee pain with any flexion.  Carefully guarded L knee maintaing it straight with all transfers.  Required helicopter technique to sit due to knee pain.  Once sitting propped L foot on trashcan sideways as stool as pt not able to tolerate foot on ground.    Transfers                   General transfer comment: deferred secondary to pain and pending xray    Ambulation/Gait                   Stairs             Wheelchair Mobility     Tilt Bed    Modified Rankin (Stroke Patients Only)       Balance Overall  balance assessment: Needs assistance Sitting-balance support: No upper extremity supported, Feet supported Sitting balance-Leahy Scale: Fair Sitting balance - Comments: initial min-mod A, progressed to CGA seated EOB; sat for 10 mins       Standing balance comment: deferred due to pain                            Cognition Arousal: Alert Behavior During Therapy: Restless Overall Cognitive Status: No family/caregiver present to determine baseline cognitive functioning                                 General Comments: oriented to self only this session, incr time to  state DOB, states "i don't care" when asked the day/month;  Pt also needing increased cues for transfers and sequencing .  Noting prior visits pt has been alert and oriented x 4 and able to follow commands for vestibular testing.        Exercises General Exercises - Lower Extremity Ankle Circles/Pumps: AROM, Both, 10 reps, Seated Heel Slides: AAROM, Right, 5 reps, Supine (did not tolerate on L)    General Comments General comments (skin integrity, edema, etc.): VSS. Tried to encourage different activities while sitting EOB ( moving UE, R LE ROM, holding head up and shoulders back but pt declined)      Pertinent Vitals/Pain Pain Assessment Pain Assessment: Faces Faces Pain Scale: Hurts whole lot Pain Location: L knee with flexion Pain Descriptors / Indicators: Discomfort, Guarding, Grimacing Pain Intervention(s): Limited activity within patient's tolerance, Monitored during session, Repositioned    Home Living                          Prior Function            PT Goals (current goals can now be found in the care plan section) Progress towards PT goals: Not progressing toward goals - comment (Pt more confused and limited by pain)    Frequency    Min 1X/week      PT Plan      Co-evaluation PT/OT/SLP Co-Evaluation/Treatment: Yes Reason for Co-Treatment: Complexity of the patient's impairments (multi-system involvement);For patient/therapist safety (no tech time available) PT goals addressed during session: Mobility/safety with mobility;Balance OT goals addressed during session: ADL's and self-care      AM-PAC PT "6 Clicks" Mobility   Outcome Measure  Help needed turning from your back to your side while in a flat bed without using bedrails?: A Lot Help needed moving from lying on your back to sitting on the side of a flat bed without using bedrails?: Total Help needed moving to and from a bed to a chair (including a wheelchair)?: Total Help needed standing  up from a chair using your arms (e.g., wheelchair or bedside chair)?: Total Help needed to walk in hospital room?: Total Help needed climbing 3-5 steps with a railing? : Total 6 Click Score: 7    End of Session Equipment Utilized During Treatment: Gait belt Activity Tolerance: Patient limited by pain Patient left: with call bell/phone within reach;in bed;with bed alarm set Nurse Communication: Mobility status PT Visit Diagnosis: Other abnormalities of gait and mobility (R26.89);History of falling (Z91.81);Muscle weakness (generalized) (M62.81);Pain;BPPV;Dizziness and giddiness (R42)     Time: 1610-9604 PT Time Calculation (min) (ACUTE ONLY): 28 min  Charges:    $Therapeutic Activity: 8-22 mins PT General  Charges $$ ACUTE PT VISIT: 1 Visit                     Anise Salvo, PT Acute Rehab Services [facility] [phone]    Rayetta Humphrey 09/22/2023, 5:25 PM

## 2023-09-22 NOTE — Plan of Care (Signed)

## 2023-09-22 NOTE — Progress Notes (Signed)
Occupational Therapy Treatment Patient Details Name: Mathew Simpson MRN: [id number] DOB: [birth year] Today's Date: 09/22/2023   History of present illness Mathew Simpson is a 87 y.o. male admitted 9/17 to the hospital after presenting to the emergency department after a fall.  CT scan was therefore obtained which demonstrated a small intracranial hemorrhage in the left frontal/parietal region. +dizziness PMH for hypertension, glaucoma, hx of B TKR.   OT comments  Pt not progressing towards goals this session, limited by L knee pain and dizziness. Pt needing set up -mod A for seated grooming and UB dressing task. Total A for pericare in sidelying. Pt mod-max A +2 for bed mobility and able to sit EOB x10 mins. Pt with significant pain with knee flexion, needing L knee extended and elevated on trash can while sitting EOB. Max A+2 to lateral scoot toward [facility] and return to supine. Pt presenting with impairments listed below, will follow acutely. Patient will benefit from continued inpatient follow up therapy, <3 hours/day to maximize safety/ind with ADLs/functional mobility.        If plan is discharge home, recommend the following:  A little help with walking and/or transfers;A little help with bathing/dressing/bathroom;Assist for transportation;Help with stairs or ramp for entrance;Assistance with cooking/housework   Equipment Recommendations  Other (comment) (defer)    Recommendations for Other Services PT consult    Precautions / Restrictions Precautions Precautions: Fall Precaution Comments: likely BPPV component Restrictions Weight Bearing Restrictions: No       Mobility Bed Mobility Overal bed mobility: Needs Assistance Bed Mobility: Sidelying to Sit, Sit to Sidelying, Rolling Rolling: Mod assist, +2 for physical assistance Sidelying to sit: Max assist, +2 for physical assistance     Sit to sidelying: Mod assist, +2 for physical assistance      Transfers                    General transfer comment: deferred secondary to pain     Balance Overall balance assessment: Needs assistance Sitting-balance support: No upper extremity supported, Feet supported Sitting balance-Leahy Scale: Fair Sitting balance - Comments: initial min-mod A, progressed to CGA seated EOB                                   ADL either performed or assessed with clinical judgement   ADL Overall ADL's : Needs assistance/impaired Eating/Feeding: Set up Eating/Feeding Details (indicate cue type and reason): drinking from cup Grooming: Wash/dry face;Set up;Sitting           Upper Body Dressing : Sitting;Moderate assistance Upper Body Dressing Details (indicate cue type and reason): donning gown on back side         Toileting- Clothing Manipulation and Hygiene: Total assistance Toileting - Clothing Manipulation Details (indicate cue type and reason): posterior pericare in sidelying            Extremity/Trunk Assessment Upper Extremity Assessment Upper Extremity Assessment: Generalized weakness   Lower Extremity Assessment Lower Extremity Assessment: Defer to PT evaluation        Vision   Additional Comments: Glaucoma at baseline per chart. Pt denying dizziness during session   Perception Perception Perception: Not tested   Praxis Praxis Praxis: Not tested    Cognition Arousal: Alert Behavior During Therapy: Restless Overall Cognitive Status: No family/caregiver present to determine baseline cognitive functioning  General Comments: oriented to self this session, incr time to state DOB, states "i don't care" when asked the day/month        Exercises      Shoulder Instructions       General Comments VSS    Pertinent Vitals/ Pain       Pain Assessment Pain Assessment: Faces Pain Score: 8  Faces Pain Scale: Hurts whole lot Pain Location: L knee with flexion Pain Descriptors / Indicators:  Discomfort, Guarding, Grimacing Pain Intervention(s): Monitored during session, Limited activity within patient's tolerance, Repositioned  Home Living                                          Prior Functioning/Environment              Frequency  Min 1X/week        Progress Toward Goals  OT Goals(current goals can now be found in the care plan section)  Progress towards OT goals: Not progressing toward goals - comment (pain)  Acute Rehab OT Goals Patient Stated Goal: to lay down OT Goal Formulation: With patient Time For Goal Achievement: 09/23/23 Potential to Achieve Goals: Good ADL Goals Pt Will Perform Grooming: with supervision;standing Pt Will Perform Lower Body Bathing: with supervision;sit to/from stand Pt Will Perform Lower Body Dressing: with supervision;sit to/from stand Pt Will Transfer to Toilet: with supervision;ambulating Pt Will Perform Toileting - Clothing Manipulation and hygiene: with supervision;sit to/from stand Additional ADL Goal #1: Pt will report dizziness less than 2/10 with positional changes during bed mobility and transfers.  Plan      Co-evaluation    PT/OT/SLP Co-Evaluation/Treatment: Yes Reason for Co-Treatment: For patient/therapist safety;Complexity of the patient's impairments (multi-system involvement);Necessary to address cognition/behavior during functional activity;To address functional/ADL transfers   OT goals addressed during session: Strengthening/ROM;ADL's and self-care (no tech time available)      AM-PAC OT "6 Clicks" Daily Activity     Outcome Measure   Help from another person eating meals?: A Little Help from another person taking care of personal grooming?: A Little Help from another person toileting, which includes using toliet, bedpan, or urinal?: A Lot Help from another person bathing (including washing, rinsing, drying)?: A Lot Help from another person to put on and taking off regular upper  body clothing?: A Lot Help from another person to put on and taking off regular lower body clothing?: A Lot 6 Click Score: 14    End of Session    OT Visit Diagnosis: Unsteadiness on feet (R26.81);Other abnormalities of gait and mobility (R26.89);Repeated falls (R29.6);Pain   Activity Tolerance Patient limited by pain   Patient Left in bed;with call bell/phone within reach;with bed alarm set   Nurse Communication Mobility status;Other (comment) (MD [place] ok'd therapy for today)        Time: [phone] OT Time Calculation (min): 28 min  Charges: OT General Charges $OT Visit: 1 Visit OT Treatments $Self Care/Home Management : 8-22 mins  Carver Fila, OTD, OTR/L SecureChat Preferred Acute Rehab [phone]   Dalphine Handing 09/22/2023, 4:47 PM

## 2023-09-22 NOTE — Progress Notes (Signed)
OT Cancellation Note  Patient Details Name: [name] MRN: [id number] DOB: 1936/07/01   Cancelled Treatment:    Reason Eval/Treat Not Completed: Patient at procedure or test/ unavailable (transport in room taking pt to xray, will follow up for OT tx as schedule permits)   Carver Fila, OTD, OTR/L SecureChat Preferred Acute Rehab [phone]   Dorene Grebe K Koonce 09/22/2023, 1:50 PM

## 2023-09-22 NOTE — Progress Notes (Signed)
[facility]  Date of Admission:  09/08/2023      Total days of antibiotics           ASSESSMENT: Mathew Simpson is a 87 y.o. male   MSSA Bacteremia, Nosocomial -  -still with some redness at right upper arm, suspect PIV related  -continue cefazolin IV  -TTE repeat looks un-concerning.  -While we did not do blood cultures on admission, there was no reason to as there was no infectious concern at that time. This appears most c/w nosocomial acquired bacteremia from PIV.  -will not plan TEE for this patient at this point -follow repeat BCx and fever curve.  -He mentioned left knee pain today to Dr. [place] - will check CRP / ESR - may need to do more sensitive imaging vs XRAY if we worry about PJI. He had contrasted C/A/P CT yesterday and CrCl 45, will consider further imaging tomorrow to allow some time between tests.   Rt Arm Phlebitis -  -Improving     PLAN: Continue cefazolin  Follow repeat BCx  Follow up left knee     Principal Problem:   Bleeding in head following injury with loss of consciousness (HCC) Active Problems:   Essential hypertension   Chronic atrial fibrillation (HCC)   Subdural hematoma (HCC)   DM2 (diabetes mellitus, type 2) (HCC)   Neck injury   Fall   Scalp laceration   Bradycardia   Protein-calorie malnutrition, moderate (HCC)   SDH (subdural hematoma) (HCC)    diltiazem  120 mg Oral Daily   feeding supplement  1 Container Oral TID BM   insulin aspart  0-5 Units Subcutaneous QHS   insulin aspart  0-9 Units Subcutaneous TID WC   irbesartan  300 mg Oral Daily   melatonin  6 mg Oral QHS   metoprolol tartrate  12.5 mg Oral BID   multivitamin with minerals  1 tablet Oral Daily   polyethylene glycol  17 g Oral BID   QUEtiapine  12.5 mg Oral QHS    SUBJECTIVE: Just does not feel great today. No particular reason why.  No fevers/chills.    Review of Systems: Review of Systems  Constitutional:  Negative  for chills and fever.  Gastrointestinal:  Negative for abdominal pain, nausea and vomiting.     Allergies  Allergen Reactions   Penicillins Anaphylaxis    Tolerates ancef  Did it involve swelling of the face/tongue/throat, SOB, or low BP? Yes Did it involve sudden or severe rash/hives, skin peeling, or any reaction on the inside of your mouth or nose? Unknown Did you need to seek medical attention at a hospital or doctor's office? Yes When did it last happen?      50+ years If all above answers are "NO", may proceed with cephalosporin use.     OBJECTIVE: Vitals:   09/21/23 2324 09/22/23 0358 09/22/23 0815 09/22/23 1121  BP: (!) 104/59 125/75 116/75 134/74  Pulse: 79 95 92 79  Resp: 16 16 20 20   Temp: 98.4 F (36.9 C) (!) 97.1 F (36.2 C) 98.1 F (36.7 C) 98.5 F (36.9 C)  TempSrc: Oral  Oral Oral  SpO2: 96% 96% 96% 97%  Weight:      Height:       Body mass index is 27.98 kg/m.   Physical Exam   Lab Results Lab Results  Component Value Date   WBC 6.9 09/22/2023   HGB 12.7 (L)  09/22/2023   HCT 37.9 (L) 09/22/2023   MCV 92.2 09/22/2023   PLT 101 (L) 09/22/2023    Lab Results  Component Value Date   CREATININE 1.33 (H) 09/22/2023   BUN 31 (H) 09/22/2023   NA 147 (H) 09/22/2023   K 3.6 09/22/2023   CL 118 (H) 09/22/2023   CO2 19 (L) 09/22/2023    Lab Results  Component Value Date   ALT 52 (H) 09/22/2023   AST 53 (H) 09/22/2023   ALKPHOS 70 09/22/2023   BILITOT 0.9 09/22/2023     Microbiology: Recent Results (from the past 240 hour(s))  Urine Culture (for pregnant, neutropenic or urologic patients or patients with an indwelling urinary catheter)     Status: Abnormal (Preliminary result)   Collection Time: 09/20/23  9:47 PM   Specimen: Urine, Clean Catch  Result Value Ref Range Status   Specimen Description URINE, CLEAN CATCH  Final   Special Requests NONE  Final   Culture (A)  Final    60,000 COLONIES/mL STAPHYLOCOCCUS AUREUS SUSCEPTIBILITIES TO  FOLLOW Performed at [facility], [address], [place], [place] [address]    Report Status PENDING  Incomplete  Culture, blood (Routine X 2) w Reflex to ID Panel     Status: Abnormal (Preliminary result)   Collection Time: 09/20/23 10:54 PM   Specimen: BLOOD RIGHT ARM  Result Value Ref Range Status   Specimen Description BLOOD RIGHT ARM  Final   Special Requests   Final    BOTTLES DRAWN AEROBIC AND ANAEROBIC Blood Culture adequate volume   Culture  Setup Time   Final    GRAM POSITIVE COCCI IN CLUSTERS IN BOTH AEROBIC AND ANAEROBIC BOTTLES CRITICAL VALUE NOTED.  VALUE IS CONSISTENT WITH PREVIOUSLY REPORTED AND CALLED VALUE. Performed at [facility], [address] [place], [place] [address]    Culture STAPHYLOCOCCUS AUREUS (A)  Final   Report Status PENDING  Incomplete  Culture, blood (Routine X 2) w Reflex to ID Panel     Status: Abnormal (Preliminary result)   Collection Time: 09/20/23 10:56 PM   Specimen: BLOOD RIGHT HAND  Result Value Ref Range Status   Specimen Description BLOOD RIGHT HAND  Final   Special Requests   Final    BOTTLES DRAWN AEROBIC AND ANAEROBIC Blood Culture adequate volume   Culture  Setup Time   Final    GRAM POSITIVE COCCI IN CLUSTERS IN BOTH AEROBIC AND ANAEROBIC BOTTLES CRITICAL RESULT CALLED TO, READ BACK BY AND VERIFIED WITH: AEustace Pen PHARMD, AT 1144 09/21/23    Culture (A)  Final    STAPHYLOCOCCUS AUREUS SUSCEPTIBILITIES TO FOLLOW Performed at [facility], [address] [place], [place] [address]    Report Status PENDING  Incomplete  Blood Culture ID Panel (Reflexed)     Status: Abnormal   Collection Time: 09/20/23 10:56 PM  Result Value Ref Range Status   Enterococcus faecalis NOT DETECTED NOT DETECTED Final   Enterococcus Faecium NOT DETECTED NOT DETECTED Final   Listeria monocytogenes NOT DETECTED NOT DETECTED Final   Staphylococcus species DETECTED (A) NOT DETECTED Final    Comment: CRITICAL RESULT CALLED TO, READ  BACK BY AND VERIFIED WITH: A. UTOMWEN PHARMD, AT 1144 09/21/23    Staphylococcus aureus (BCID) DETECTED (A) NOT DETECTED Final    Comment: CRITICAL RESULT CALLED TO, READ BACK BY AND VERIFIED WITH: A. UTOMWEN PHARMD, AT 1144 09/21/23    Staphylococcus epidermidis NOT DETECTED NOT DETECTED Final  Staphylococcus lugdunensis NOT DETECTED NOT DETECTED Final   Streptococcus species NOT DETECTED NOT DETECTED Final   Streptococcus agalactiae NOT DETECTED NOT DETECTED Final   Streptococcus pneumoniae NOT DETECTED NOT DETECTED Final   Streptococcus pyogenes NOT DETECTED NOT DETECTED Final   A.calcoaceticus-baumannii NOT DETECTED NOT DETECTED Final   Bacteroides fragilis NOT DETECTED NOT DETECTED Final   Enterobacterales NOT DETECTED NOT DETECTED Final   Enterobacter cloacae complex NOT DETECTED NOT DETECTED Final   Escherichia coli NOT DETECTED NOT DETECTED Final   Klebsiella aerogenes NOT DETECTED NOT DETECTED Final   Klebsiella oxytoca NOT DETECTED NOT DETECTED Final   Klebsiella pneumoniae NOT DETECTED NOT DETECTED Final   Proteus species NOT DETECTED NOT DETECTED Final   Salmonella species NOT DETECTED NOT DETECTED Final   Serratia marcescens NOT DETECTED NOT DETECTED Final   Haemophilus influenzae NOT DETECTED NOT DETECTED Final   Neisseria meningitidis NOT DETECTED NOT DETECTED Final   Pseudomonas aeruginosa NOT DETECTED NOT DETECTED Final   Stenotrophomonas maltophilia NOT DETECTED NOT DETECTED Final   Candida albicans NOT DETECTED NOT DETECTED Final   Candida auris NOT DETECTED NOT DETECTED Final   Candida glabrata NOT DETECTED NOT DETECTED Final   Candida krusei NOT DETECTED NOT DETECTED Final   Candida parapsilosis NOT DETECTED NOT DETECTED Final   Candida tropicalis NOT DETECTED NOT DETECTED Final   Cryptococcus neoformans/gattii NOT DETECTED NOT DETECTED Final   Meth resistant mecA/C and MREJ NOT DETECTED NOT DETECTED Final    Comment: Performed at [facility], [address] [place], [place] [address]  MRSA Next Gen by PCR, Nasal     Status: None   Collection Time: 09/21/23  7:12 AM   Specimen: Nasal Mucosa; Nasal Swab  Result Value Ref Range Status   MRSA by PCR Next Gen NOT DETECTED NOT DETECTED Final    Comment: (NOTE) The GeneXpert MRSA Assay (FDA approved for NASAL specimens only), is one component of a comprehensive MRSA colonization surveillance program. It is not intended to diagnose MRSA infection nor to guide or monitor treatment for MRSA infections. Test performance is not FDA approved in patients less than 16 years old. Performed at [facility], [address], [place], [place] [address]    Rexene Alberts, MSN, NP-C [facility]  [email or web address] Pager: [phone] Office: [phone] [facility] Main Line: [phone] *Secure Chat Communication Welcome

## 2023-09-23 DIAGNOSIS — B9561 Methicillin susceptible Staphylococcus aureus infection as the cause of diseases classified elsewhere: Secondary | ICD-10-CM | POA: Diagnosis not present

## 2023-09-23 DIAGNOSIS — S06309D Unspecified focal traumatic brain injury with loss of consciousness of unspecified duration, subsequent encounter: Secondary | ICD-10-CM

## 2023-09-23 DIAGNOSIS — R7881 Bacteremia: Secondary | ICD-10-CM

## 2023-09-23 DIAGNOSIS — E440 Moderate protein-calorie malnutrition: Secondary | ICD-10-CM

## 2023-09-23 DIAGNOSIS — E119 Type 2 diabetes mellitus without complications: Secondary | ICD-10-CM | POA: Diagnosis not present

## 2023-09-23 DIAGNOSIS — R001 Bradycardia, unspecified: Secondary | ICD-10-CM | POA: Diagnosis not present

## 2023-09-23 DIAGNOSIS — I482 Chronic atrial fibrillation, unspecified: Secondary | ICD-10-CM | POA: Diagnosis not present

## 2023-09-23 DIAGNOSIS — M25462 Effusion, left knee: Secondary | ICD-10-CM

## 2023-09-23 LAB — GLUCOSE, CAPILLARY
Glucose-Capillary: 146 mg/dL — ABNORMAL HIGH (ref 70–99)
Glucose-Capillary: 167 mg/dL — ABNORMAL HIGH (ref 70–99)
Glucose-Capillary: 155 mg/dL — ABNORMAL HIGH (ref 70–99)
Glucose-Capillary: 225 mg/dL — ABNORMAL HIGH (ref 70–99)

## 2023-09-23 LAB — SYNOVIAL CELL COUNT + DIFF, W/ CRYSTALS
Crystals, Fluid: NONE SEEN
Eosinophils-Synovial: 0 % (ref 0–1)
Lymphocytes-Synovial Fld: 3 % (ref 0–20)
Monocyte-Macrophage-Synovial Fluid: 10 % — ABNORMAL LOW (ref 50–90)
Neutrophil, Synovial: 87 % — ABNORMAL HIGH (ref 0–25)
WBC, Synovial: 44400 /mm3 — ABNORMAL HIGH (ref 0–200)

## 2023-09-23 LAB — BASIC METABOLIC PANEL
Anion gap: 9 (ref 5–15)
BUN: 27 mg/dL — ABNORMAL HIGH (ref 8–23)
CO2: 19 mmol/L — ABNORMAL LOW (ref 22–32)
Calcium: 7.9 mg/dL — ABNORMAL LOW (ref 8.9–10.3)
Chloride: 113 mmol/L — ABNORMAL HIGH (ref 98–111)
Creatinine, Ser: 1.05 mg/dL (ref 0.61–1.24)
GFR, Estimated: >60 mL/min (ref >60)
Glucose, Bld: 163 mg/dL — ABNORMAL HIGH (ref 70–99)
Potassium: 3.2 mmol/L — ABNORMAL LOW (ref 3.5–5.1)
Sodium: 141 mmol/L (ref 135–145)

## 2023-09-23 LAB — PROCALCITONIN: Procalcitonin: 5.05 ng/mL

## 2023-09-23 LAB — URINE CULTURE: Culture: 60000 — AB

## 2023-09-23 LAB — CULTURE, BLOOD (ROUTINE X 2)
Special Requests: ADEQUATE
Special Requests: ADEQUATE

## 2023-09-23 LAB — SEDIMENTATION RATE: Sed Rate: 35 mm/h — ABNORMAL HIGH (ref 0–16)

## 2023-09-23 LAB — CBC
HCT: 35.5 % — ABNORMAL LOW (ref 39.0–52.0)
Hemoglobin: 11.6 g/dL — ABNORMAL LOW (ref 13.0–17.0)
MCH: 29.5 pg (ref 26.0–34.0)
MCHC: 32.7 g/dL (ref 30.0–36.0)
MCV: 90.3 fL (ref 80.0–100.0)
Platelets: 98 10*3/uL — ABNORMAL LOW (ref 150–400)
RBC: 3.93 MIL/uL — ABNORMAL LOW (ref 4.22–5.81)
RDW: 14.6 % (ref 11.5–15.5)
WBC: 6.9 10*3/uL (ref 4.0–10.5)
nRBC: 0 % (ref 0.0–0.2)

## 2023-09-23 LAB — C-REACTIVE PROTEIN: CRP: 17.1 mg/dL — ABNORMAL HIGH (ref <1.0)

## 2023-09-23 MED ORDER — BUPIVACAINE HCL (PF) 0.5 % IJ SOLN
10.0000 mL | Freq: Once | INTRAMUSCULAR | Status: DC
  Filled 2023-09-23: qty 10

## 2023-09-23 NOTE — H&P (View-Only) (Signed)
Subjective:  HPI: Mathew Simpson, 87 y.o. male, s/p left TKA by Ollen Gross, MD in 2020 presented to the [facility] ED around 2 weeks ago due to a fall. Has been admitted since that time found to have MSSA bacteremia. Orthopedics consulted due to new onset left knee pain and swelling. Patients states the knee pain is chronic, but has worsened over the past few days while in the hospital.   Patient Active Problem List   Diagnosis Date Noted   SDH (subdural hematoma) (HCC) 09/17/2023   Bradycardia 09/16/2023   Protein-calorie malnutrition, moderate (HCC) 09/16/2023   Scalp laceration 09/12/2023   Fall 09/10/2023   Bleeding in head following injury with loss of consciousness (HCC) 09/08/2023   Subdural hematoma (HCC) 09/08/2023   DM2 (diabetes mellitus, type 2) (HCC) 09/08/2023   Neck injury 09/08/2023   Malnutrition of moderate degree 07/18/2022   Cellulitis of left foot 07/17/2022   Sepsis due to cellulitis (HCC) 07/17/2022   Chronic atrial fibrillation (HCC) 07/17/2022   Hypokalemia 07/17/2022   Mild protein malnutrition (HCC) 07/17/2022   Hyperbilirubinemia 07/17/2022   Primary osteoarthritis of right knee 03/12/2020   Cellulitis of left lower extremity    Septic joint of left knee joint (HCC) 09/15/2019   Acute blood loss anemia 09/15/2019   Essential hypertension 09/15/2019   OA (osteoarthritis) of knee 09/12/2019    Past Medical History:  Diagnosis Date   Arthritis    back,   DJD (degenerative joint disease)    Dysrhythmia    A-fib   Glaucoma    Hypertension     Past Surgical History:  Procedure Laterality Date   EYE SURGERY     bil    JOINT REPLACEMENT     knee surgery     arthroscopic   TOTAL KNEE ARTHROPLASTY Left 09/12/2019   Procedure: TOTAL KNEE ARTHROPLASTY;  Surgeon: Ollen Gross, MD;  Location: [facility];  Service: Orthopedics;  Laterality: Left;    TOTAL KNEE ARTHROPLASTY Right 03/12/2020   Procedure: TOTAL KNEE ARTHROPLASTY;  Surgeon:  Ollen Gross, MD;  Location: [facility];  Service: Orthopedics;  Laterality: Right;     Prior to Admission medications   Medication Sig Start Date End Date Taking? Authorizing Provider  apixaban (ELIQUIS) 5 MG TABS tablet Take 5 mg by mouth 2 (two) times daily.   Yes [provider]  Calcium Citrate-Vitamin D (CALCIUM + D PO) Take 1 tablet by mouth 2 (two) times daily.   Yes [provider]  Cholecalciferol (DIALYVITE VITAMIN D 5000) 125 MCG (5000 UT) capsule Take 5,000 Units by mouth every evening.    Yes [provider]  diltiazem (CARDIZEM CD) 180 MG 24 hr capsule Take 180 mg by mouth daily.   Yes [provider]  HYDROcodone-acetaminophen (NORCO) 10-325 MG tablet Take 1 tablet by mouth 4 (four) times daily. 07/23/22  Yes Gherghe, Daylene Katayama, MD  irbesartan (AVAPRO) 150 MG tablet Take 150 mg by mouth daily. 07/14/22  Yes [provider]  ketorolac (ACULAR) 0.5 % ophthalmic solution Place 1 drop into the left eye 4 (four) times daily. 07/23/22  Yes Leatha Gilding, MD  Multiple Vitamin (MULTIVITAMIN WITH MINERALS) TABS tablet Take 1 tablet by mouth daily at 2 PM.    Yes [provider]  Multiple Vitamins-Minerals (PRESERVISION AREDS 2 PO) Take 1 tablet by mouth in the morning and at bedtime.   Yes [provider]  TIADYLT ER 180 MG 24 hr capsule Take  180 mg by mouth daily. 05/05/22  Yes [provider]  ciprofloxacin (CILOXAN) 0.3 % ophthalmic solution Place 1 drop into the left eye every 4 (four) hours while awake. Administer 1 drop, every 2 hours, while awake, for 2 days. Then 1 drop, every 4 hours, while awake, for the next 5 days. Patient not taking: Reported on 09/08/2023 07/23/22   Leatha Gilding, MD    Allergies  Allergen Reactions   Penicillins Anaphylaxis    Tolerates ancef  Did it involve swelling of the face/tongue/throat, SOB, or low BP? Yes Did it involve sudden or severe rash/hives, skin peeling, or any  reaction on the inside of your mouth or nose? Unknown Did you need to seek medical attention at a hospital or doctor's office? Yes When did it last happen?      50+ years If all above answers are "NO", may proceed with cephalosporin use.     Social History   Socioeconomic History   Marital status: Single    Spouse name: Not on file   Number of children: Not on file   Years of education: Not on file   Highest education level: Not on file  Occupational History   Not on file  Tobacco Use   Smoking status: Former    Current packs/day: 2.00    Average packs/day: 2.0 packs/day for 10.0 years (20.0 ttl pk-yrs)    Types: Cigarettes   Smokeless tobacco: Never   Tobacco comments:    quit in 20's  Vaping Use   Vaping status: Never Used  Substance and Sexual Activity   Alcohol use: Not Currently   Drug use: Not Currently   Sexual activity: Not Currently  Other Topics Concern   Not on file  Social History Narrative   Not on file   Social Determinants of Health   Financial Resource Strain: Not on file  Food Insecurity: No Food Insecurity (09/10/2023)   Hunger Vital Sign    Worried About Running Out of Food in the Last Year: Never true    Ran Out of Food in the Last Year: Never true  Transportation Needs: No Transportation Needs (09/10/2023)   PRAPARE - Administrator, Civil Service (Medical): No    Lack of Transportation (Non-Medical): No  Physical Activity: Not on file  Stress: Not on file  Social Connections: Not on file  Intimate Partner Violence: Not At Risk (09/14/2023)   Humiliation, Afraid, Rape, and Kick questionnaire    Fear of Current or Ex-Partner: No    Emotionally Abused: No    Physically Abused: No    Sexually Abused: No    Tobacco Use: Medium Risk (09/08/2023)   Patient History    Smoking Tobacco Use: Former    Smokeless Tobacco Use: Never    Passive Exposure: Not on file   Social History   Substance and Sexual Activity  Alcohol Use Not  Currently    Family History  Problem Relation Age of Onset   Hypertension Other     Review of Systems  Constitutional:  Negative for chills and fever.  HENT:  Negative for congestion, sore throat and tinnitus.   Eyes:  Negative for double vision, photophobia and pain.  Respiratory:  Negative for cough, shortness of breath and wheezing.   Cardiovascular:  Negative for chest pain, palpitations and orthopnea.  Gastrointestinal:  Negative for heartburn, nausea and vomiting.  Genitourinary:  Negative for dysuria, frequency and urgency.  Musculoskeletal:  Positive for joint pain.  Neurological:  Negative for dizziness, weakness and headaches.    Objective:  Physical Exam:  Well nourished and well developed.  General: Alert and oriented x3, cooperative and pleasant, no acute distress.  Head: normocephalic, atraumatic, neck supple.  Eyes: EOMI.  Musculoskeletal:  Left knee exam: Well healed arthroplasty scar Mild-moderate effusion Tenderness to any palpation of the joint Minimal erythema, warm to touch  Calves soft and nontender. Motor function intact in LE. Strength 5/5 LE bilaterally. Neuro: Distal pulses 2+. Sensation to light touch intact in LE.   Vital signs in last 24 hours: Temp:  [98 F (36.7 C)-98.9 F (37.2 C)] 98.9 F (37.2 C) (10/02 1245) Pulse Rate:  [80-96] 85 (10/02 1245) Resp:  [18] 18 (10/02 1245) BP: (135-149)/(80-91) 149/87 (10/02 1245) SpO2:  [97 %-99 %] 98 % (10/02 1245)   Assessment/Plan:  Status post left total knee arthroplasty   Mr. Steppe is s/p left TKA from 2020, has worsening joint pain and swelling in the setting of MSSA bacteremia. We were consulted for joint aspiration to r/o PJI. This was performed today at the bedside without difficulty. Unable to pull much fluid from the joint, but this did have a turbid appearance. Sending fluid off for cell count and culture. Will continue to follow. See separate note for procedure details.    Arther Abbott, PA-C Orthopedic Surgery [facility] [place]

## 2023-09-23 NOTE — Progress Notes (Signed)
[facility]  Date of Admission:  09/08/2023      Total days of antibiotics   Cefazolin 9/29 >> c           ASSESSMENT: Mathew Simpson is a 87 y.o. male   MSSA Bacteremia, Nosocomial -  -still with some redness at right upper arm, suspect PIV related  -continue cefazolin IV  -TTE repeat looks un-concerning.  -While we did not do blood cultures on admission, there was no reason to as there was no infectious concern at that time. This appears most c/w nosocomial acquired bacteremia from PIV.  -follow repeat BCx and fever curve.  -Left knee with sizeable effusion - see below    Rt Arm Phlebitis -  -Improving  -likely source initially    Left Knee Effusion, Pain -  TKR in place -  -Sizeable left knee effusion with restricted flexion and prohibits any standing / movement. Tender to the touch but not hot, purplish discoloration vs contralateral knee which is non tender.  -I worry his knee has been seeded in the setting of his MSSA bacteremia and recommend orthopedics to see him for consideration of arthrocentesis to confirm diagnosis. No h/o gout in the past. -Continue cefazolin  -Please send fluid for cell count, culture    PLAN: Continue cefazolin  Follow repeat BCx  Will consult ortho to see him for consideration of arthrocentesis given concern for infected knee prosthesis.     Principal Problem:   Bleeding in head following injury with loss of consciousness (HCC) Active Problems:   Essential hypertension   Chronic atrial fibrillation (HCC)   Subdural hematoma (HCC)   DM2 (diabetes mellitus, type 2) (HCC)   Neck injury   Fall   Scalp laceration   Bradycardia   Protein-calorie malnutrition, moderate (HCC)   SDH (subdural hematoma) (HCC)    diltiazem  120 mg Oral Daily   feeding supplement  1 Container Oral TID BM   insulin aspart  0-5 Units Subcutaneous QHS   insulin aspart  0-9 Units Subcutaneous TID WC   irbesartan  300  mg Oral Daily   melatonin  6 mg Oral QHS   metoprolol tartrate  12.5 mg Oral BID   multivitamin with minerals  1 tablet Oral Daily   polyethylene glycol  17 g Oral BID   QUEtiapine  12.5 mg Oral QHS    SUBJECTIVE: Just does not feel great today. No particular reason why.  No fevers/chills.    Review of Systems: Review of Systems  Constitutional:  Negative for chills and fever.  Gastrointestinal:  Negative for abdominal pain, nausea and vomiting.  Psychiatric/Behavioral:  Negative for suicidal ideas.      Allergies  Allergen Reactions   Penicillins Anaphylaxis    Tolerates ancef  Did it involve swelling of the face/tongue/throat, SOB, or low BP? Yes Did it involve sudden or severe rash/hives, skin peeling, or any reaction on the inside of your mouth or nose? Unknown Did you need to seek medical attention at a hospital or doctor's office? Yes When did it last happen?      50+ years If all above answers are "NO", may proceed with cephalosporin use.     OBJECTIVE: Vitals:   09/22/23 2016 09/22/23 2323 09/23/23 0347 09/23/23 0827  BP: (!) 145/91 135/80 (!) 145/85 (!) 141/85  Pulse: 96 80 86 87  Resp: 18 18 18 18   Temp: 98.5 F (36.9 C) 98 F (  36.7 C) 98.2 F (36.8 C) 98.4 F (36.9 C)  TempSrc: Oral Oral Oral Oral  SpO2: 97% 99% 99% 99%  Weight:      Height:       Body mass index is 27.98 kg/m.   Physical Exam Constitutional:      Appearance: Normal appearance.  Cardiovascular:     Rate and Rhythm: Normal rate.     Heart sounds: No murmur heard. Pulmonary:     Effort: No respiratory distress.     Breath sounds: Normal breath sounds.  Abdominal:     General: Bowel sounds are normal. There is no distension.  Musculoskeletal:     Left knee: Swelling and effusion present. Decreased range of motion. Tenderness present.  Skin:    General: Skin is warm and dry.     Capillary Refill: Capillary refill takes less than 2 seconds.     Findings: No rash.   Neurological:     Mental Status: He is alert and oriented to person, place, and time.      Lab Results Lab Results  Component Value Date   WBC 6.9 09/23/2023   HGB 11.6 (L) 09/23/2023   HCT 35.5 (L) 09/23/2023   MCV 90.3 09/23/2023   PLT 98 (L) 09/23/2023    Lab Results  Component Value Date   CREATININE 1.05 09/23/2023   BUN 27 (H) 09/23/2023   NA 141 09/23/2023   K 3.2 (L) 09/23/2023   CL 113 (H) 09/23/2023   CO2 19 (L) 09/23/2023    Lab Results  Component Value Date   ALT 52 (H) 09/22/2023   AST 53 (H) 09/22/2023   ALKPHOS 70 09/22/2023   BILITOT 0.9 09/22/2023     Microbiology: Recent Results (from the past 240 hour(s))  Urine Culture (for pregnant, neutropenic or urologic patients or patients with an indwelling urinary catheter)     Status: Abnormal   Collection Time: 09/20/23  9:47 PM   Specimen: Urine, Clean Catch  Result Value Ref Range Status   Specimen Description URINE, CLEAN CATCH  Final   Special Requests   Final    NONE Performed at [facility], [address] [place], [place] [address]    Culture 60,000 COLONIES/mL STAPHYLOCOCCUS AUREUS (A)  Final   Report Status 09/23/2023 FINAL  Final   Organism ID, Bacteria STAPHYLOCOCCUS AUREUS (A)  Final      Susceptibility   Staphylococcus aureus - MIC*    CIPROFLOXACIN <=0.5 SENSITIVE Sensitive     GENTAMICIN <=0.5 SENSITIVE Sensitive     NITROFURANTOIN <=16 SENSITIVE Sensitive     OXACILLIN <=0.25 SENSITIVE Sensitive     TETRACYCLINE <=1 SENSITIVE Sensitive     VANCOMYCIN <=0.5 SENSITIVE Sensitive     TRIMETH/SULFA <=10 SENSITIVE Sensitive     CLINDAMYCIN <=0.25 SENSITIVE Sensitive     RIFAMPIN <=0.5 SENSITIVE Sensitive     Inducible Clindamycin NEGATIVE Sensitive     LINEZOLID 2 SENSITIVE Sensitive     * 60,000 COLONIES/mL STAPHYLOCOCCUS AUREUS  Culture, blood (Routine X 2) w Reflex to ID Panel     Status: Abnormal   Collection Time: 09/20/23 10:54 PM   Specimen: BLOOD RIGHT ARM  Result  Value Ref Range Status   Specimen Description BLOOD RIGHT ARM  Final   Special Requests   Final    BOTTLES DRAWN AEROBIC AND ANAEROBIC Blood Culture adequate volume   Culture  Setup Time   Final    GRAM POSITIVE COCCI IN CLUSTERS IN BOTH AEROBIC  AND ANAEROBIC BOTTLES CRITICAL VALUE NOTED.  VALUE IS CONSISTENT WITH PREVIOUSLY REPORTED AND CALLED VALUE.    Culture (A)  Final    STAPHYLOCOCCUS AUREUS SUSCEPTIBILITIES PERFORMED ON PREVIOUS CULTURE WITHIN THE LAST 5 DAYS. Performed at [facility], [address] [place], [place] [address]    Report Status 09/23/2023 FINAL  Final  Culture, blood (Routine X 2) w Reflex to ID Panel     Status: Abnormal   Collection Time: 09/20/23 10:56 PM   Specimen: BLOOD RIGHT HAND  Result Value Ref Range Status   Specimen Description BLOOD RIGHT HAND  Final   Special Requests   Final    BOTTLES DRAWN AEROBIC AND ANAEROBIC Blood Culture adequate volume   Culture  Setup Time   Final    GRAM POSITIVE COCCI IN CLUSTERS IN BOTH AEROBIC AND ANAEROBIC BOTTLES CRITICAL RESULT CALLED TO, READ BACK BY AND VERIFIED WITHSalina April PHARMD, AT 1144 09/21/23 Performed at [facility], [address], [place], [place] [address]    Culture STAPHYLOCOCCUS AUREUS (A)  Final   Report Status 09/23/2023 FINAL  Final   Organism ID, Bacteria STAPHYLOCOCCUS AUREUS  Final      Susceptibility   Staphylococcus aureus - MIC*    CIPROFLOXACIN <=0.5 SENSITIVE Sensitive     ERYTHROMYCIN <=0.25 SENSITIVE Sensitive     GENTAMICIN <=0.5 SENSITIVE Sensitive     OXACILLIN 0.5 SENSITIVE Sensitive     TETRACYCLINE <=1 SENSITIVE Sensitive     VANCOMYCIN <=0.5 SENSITIVE Sensitive     TRIMETH/SULFA <=10 SENSITIVE Sensitive     CLINDAMYCIN <=0.25 SENSITIVE Sensitive     RIFAMPIN <=0.5 SENSITIVE Sensitive     Inducible Clindamycin NEGATIVE Sensitive     LINEZOLID 2 SENSITIVE Sensitive     * STAPHYLOCOCCUS AUREUS  Blood Culture ID Panel (Reflexed)     Status: Abnormal    Collection Time: 09/20/23 10:56 PM  Result Value Ref Range Status   Enterococcus faecalis NOT DETECTED NOT DETECTED Final   Enterococcus Faecium NOT DETECTED NOT DETECTED Final   Listeria monocytogenes NOT DETECTED NOT DETECTED Final   Staphylococcus species DETECTED (A) NOT DETECTED Final    Comment: CRITICAL RESULT CALLED TO, READ BACK BY AND VERIFIED WITH: A. UTOMWEN PHARMD, AT 1144 09/21/23    Staphylococcus aureus (BCID) DETECTED (A) NOT DETECTED Final    Comment: CRITICAL RESULT CALLED TO, READ BACK BY AND VERIFIED WITH: A. UTOMWEN PHARMD, AT 1144 09/21/23    Staphylococcus epidermidis NOT DETECTED NOT DETECTED Final   Staphylococcus lugdunensis NOT DETECTED NOT DETECTED Final   Streptococcus species NOT DETECTED NOT DETECTED Final   Streptococcus agalactiae NOT DETECTED NOT DETECTED Final   Streptococcus pneumoniae NOT DETECTED NOT DETECTED Final   Streptococcus pyogenes NOT DETECTED NOT DETECTED Final   A.calcoaceticus-baumannii NOT DETECTED NOT DETECTED Final   Bacteroides fragilis NOT DETECTED NOT DETECTED Final   Enterobacterales NOT DETECTED NOT DETECTED Final   Enterobacter cloacae complex NOT DETECTED NOT DETECTED Final   Escherichia coli NOT DETECTED NOT DETECTED Final   Klebsiella aerogenes NOT DETECTED NOT DETECTED Final   Klebsiella oxytoca NOT DETECTED NOT DETECTED Final   Klebsiella pneumoniae NOT DETECTED NOT DETECTED Final   Proteus species NOT DETECTED NOT DETECTED Final   Salmonella species NOT DETECTED NOT DETECTED Final   Serratia marcescens NOT DETECTED NOT DETECTED Final   Haemophilus influenzae NOT DETECTED NOT DETECTED Final   Neisseria meningitidis NOT DETECTED NOT DETECTED Final   Pseudomonas aeruginosa NOT DETECTED NOT DETECTED  Final   Stenotrophomonas maltophilia NOT DETECTED NOT DETECTED Final   Candida albicans NOT DETECTED NOT DETECTED Final   Candida auris NOT DETECTED NOT DETECTED Final   Candida glabrata NOT DETECTED NOT DETECTED Final    Candida krusei NOT DETECTED NOT DETECTED Final   Candida parapsilosis NOT DETECTED NOT DETECTED Final   Candida tropicalis NOT DETECTED NOT DETECTED Final   Cryptococcus neoformans/gattii NOT DETECTED NOT DETECTED Final   Meth resistant mecA/C and MREJ NOT DETECTED NOT DETECTED Final    Comment: Performed at [facility], [address], [place], [place] [address]  MRSA Next Gen by PCR, Nasal     Status: None   Collection Time: 09/21/23  7:12 AM   Specimen: Nasal Mucosa; Nasal Swab  Result Value Ref Range Status   MRSA by PCR Next Gen NOT DETECTED NOT DETECTED Final    Comment: (NOTE) The GeneXpert MRSA Assay (FDA approved for NASAL specimens only), is one component of a comprehensive MRSA colonization surveillance program. It is not intended to diagnose MRSA infection nor to guide or monitor treatment for MRSA infections. Test performance is not FDA approved in patients less than 26 years old. Performed at [facility], [address], [place], [place] [address]   Culture, blood (Routine X 2) w Reflex to ID Panel     Status: None (Preliminary result)   Collection Time: 09/22/23  6:20 AM   Specimen: BLOOD RIGHT HAND  Result Value Ref Range Status   Specimen Description BLOOD RIGHT HAND  Final   Special Requests   Final    BOTTLES DRAWN AEROBIC AND ANAEROBIC Blood Culture results may not be optimal due to an excessive volume of blood received in culture bottles   Culture   Final    NO GROWTH < 24 HOURS Performed at [facility], [address], [place], [place] [address]    Report Status PENDING  Incomplete  Culture, blood (Routine X 2) w Reflex to ID Panel     Status: None (Preliminary result)   Collection Time: 09/22/23  6:25 AM   Specimen: BLOOD LEFT HAND  Result Value Ref Range Status   Specimen Description BLOOD LEFT HAND  Final   Special Requests   Final    BOTTLES DRAWN AEROBIC AND ANAEROBIC Blood Culture results may not be optimal due to an excessive  volume of blood received in culture bottles   Culture   Final    NO GROWTH < 24 HOURS Performed at [facility], [address], [place], [place] [address]    Report Status PENDING  Incomplete   Rexene Alberts, MSN, NP-C [facility]  [email or web address] Pager: [phone] Office: [phone] [facility] Main Line: [phone] *Secure Chat Communication Welcome

## 2023-09-23 NOTE — Consult Note (Signed)
Subjective:  HPI: Mathew Simpson, 87 y.o. male, s/p left TKA by Ollen Gross, MD in 2020 presented to the [facility] ED around 2 weeks ago due to a fall. Has been admitted since that time found to have MSSA bacteremia. Orthopedics consulted due to new onset left knee pain and swelling. Patients states the knee pain is chronic, but has worsened over the past few days while in the hospital.   Patient Active Problem List   Diagnosis Date Noted   SDH (subdural hematoma) (HCC) 09/17/2023   Bradycardia 09/16/2023   Protein-calorie malnutrition, moderate (HCC) 09/16/2023   Scalp laceration 09/12/2023   Fall 09/10/2023   Bleeding in head following injury with loss of consciousness (HCC) 09/08/2023   Subdural hematoma (HCC) 09/08/2023   DM2 (diabetes mellitus, type 2) (HCC) 09/08/2023   Neck injury 09/08/2023   Malnutrition of moderate degree 07/18/2022   Cellulitis of left foot 07/17/2022   Sepsis due to cellulitis (HCC) 07/17/2022   Chronic atrial fibrillation (HCC) 07/17/2022   Hypokalemia 07/17/2022   Mild protein malnutrition (HCC) 07/17/2022   Hyperbilirubinemia 07/17/2022   Primary osteoarthritis of right knee 03/12/2020   Cellulitis of left lower extremity    Septic joint of left knee joint (HCC) 09/15/2019   Acute blood loss anemia 09/15/2019   Essential hypertension 09/15/2019   OA (osteoarthritis) of knee 09/12/2019    Past Medical History:  Diagnosis Date   Arthritis    back,   DJD (degenerative joint disease)    Dysrhythmia    A-fib   Glaucoma    Hypertension     Past Surgical History:  Procedure Laterality Date   EYE SURGERY     bil    JOINT REPLACEMENT     knee surgery     arthroscopic   TOTAL KNEE ARTHROPLASTY Left 09/12/2019   Procedure: TOTAL KNEE ARTHROPLASTY;  Surgeon: Ollen Gross, MD;  Location: [facility];  Service: Orthopedics;  Laterality: Left;    TOTAL KNEE ARTHROPLASTY Right 03/12/2020   Procedure: TOTAL KNEE ARTHROPLASTY;  Surgeon:  Ollen Gross, MD;  Location: [facility];  Service: Orthopedics;  Laterality: Right;     Prior to Admission medications   Medication Sig Start Date End Date Taking? Authorizing Provider  apixaban (ELIQUIS) 5 MG TABS tablet Take 5 mg by mouth 2 (two) times daily.   Yes [provider]  Calcium Citrate-Vitamin D (CALCIUM + D PO) Take 1 tablet by mouth 2 (two) times daily.   Yes [provider]  Cholecalciferol (DIALYVITE VITAMIN D 5000) 125 MCG (5000 UT) capsule Take 5,000 Units by mouth every evening.    Yes [provider]  diltiazem (CARDIZEM CD) 180 MG 24 hr capsule Take 180 mg by mouth daily.   Yes [provider]  HYDROcodone-acetaminophen (NORCO) 10-325 MG tablet Take 1 tablet by mouth 4 (four) times daily. 07/23/22  Yes Gherghe, Daylene Katayama, MD  irbesartan (AVAPRO) 150 MG tablet Take 150 mg by mouth daily. 07/14/22  Yes [provider]  ketorolac (ACULAR) 0.5 % ophthalmic solution Place 1 drop into the left eye 4 (four) times daily. 07/23/22  Yes Leatha Gilding, MD  Multiple Vitamin (MULTIVITAMIN WITH MINERALS) TABS tablet Take 1 tablet by mouth daily at 2 PM.    Yes [provider]  Multiple Vitamins-Minerals (PRESERVISION AREDS 2 PO) Take 1 tablet by mouth in the morning and at bedtime.   Yes [provider]  TIADYLT ER 180 MG 24 hr capsule Take  180 mg by mouth daily. 05/05/22  Yes [provider]  ciprofloxacin (CILOXAN) 0.3 % ophthalmic solution Place 1 drop into the left eye every 4 (four) hours while awake. Administer 1 drop, every 2 hours, while awake, for 2 days. Then 1 drop, every 4 hours, while awake, for the next 5 days. Patient not taking: Reported on 09/08/2023 07/23/22   Leatha Gilding, MD    Allergies  Allergen Reactions   Penicillins Anaphylaxis    Tolerates ancef  Did it involve swelling of the face/tongue/throat, SOB, or low BP? Yes Did it involve sudden or severe rash/hives, skin peeling, or any  reaction on the inside of your mouth or nose? Unknown Did you need to seek medical attention at a hospital or doctor's office? Yes When did it last happen?      50+ years If all above answers are "NO", may proceed with cephalosporin use.     Social History   Socioeconomic History   Marital status: Single    Spouse name: Not on file   Number of children: Not on file   Years of education: Not on file   Highest education level: Not on file  Occupational History   Not on file  Tobacco Use   Smoking status: Former    Current packs/day: 2.00    Average packs/day: 2.0 packs/day for 10.0 years (20.0 ttl pk-yrs)    Types: Cigarettes   Smokeless tobacco: Never   Tobacco comments:    quit in 20's  Vaping Use   Vaping status: Never Used  Substance and Sexual Activity   Alcohol use: Not Currently   Drug use: Not Currently   Sexual activity: Not Currently  Other Topics Concern   Not on file  Social History Narrative   Not on file   Social Determinants of Health   Financial Resource Strain: Not on file  Food Insecurity: No Food Insecurity (09/10/2023)   Hunger Vital Sign    Worried About Running Out of Food in the Last Year: Never true    Ran Out of Food in the Last Year: Never true  Transportation Needs: No Transportation Needs (09/10/2023)   PRAPARE - Administrator, Civil Service (Medical): No    Lack of Transportation (Non-Medical): No  Physical Activity: Not on file  Stress: Not on file  Social Connections: Not on file  Intimate Partner Violence: Not At Risk (09/14/2023)   Humiliation, Afraid, Rape, and Kick questionnaire    Fear of Current or Ex-Partner: No    Emotionally Abused: No    Physically Abused: No    Sexually Abused: No    Tobacco Use: Medium Risk (09/08/2023)   Patient History    Smoking Tobacco Use: Former    Smokeless Tobacco Use: Never    Passive Exposure: Not on file   Social History   Substance and Sexual Activity  Alcohol Use Not  Currently    Family History  Problem Relation Age of Onset   Hypertension Other     Review of Systems  Constitutional:  Negative for chills and fever.  HENT:  Negative for congestion, sore throat and tinnitus.   Eyes:  Negative for double vision, photophobia and pain.  Respiratory:  Negative for cough, shortness of breath and wheezing.   Cardiovascular:  Negative for chest pain, palpitations and orthopnea.  Gastrointestinal:  Negative for heartburn, nausea and vomiting.  Genitourinary:  Negative for dysuria, frequency and urgency.  Musculoskeletal:  Positive for joint pain.  Neurological:  Negative for dizziness, weakness and headaches.    Objective:  Physical Exam:  Well nourished and well developed.  General: Alert and oriented x3, cooperative and pleasant, no acute distress.  Head: normocephalic, atraumatic, neck supple.  Eyes: EOMI.  Musculoskeletal:  Left knee exam: Well healed arthroplasty scar Mild-moderate effusion Tenderness to any palpation of the joint Minimal erythema, warm to touch  Calves soft and nontender. Motor function intact in LE. Strength 5/5 LE bilaterally. Neuro: Distal pulses 2+. Sensation to light touch intact in LE.   Vital signs in last 24 hours: Temp:  [98 F (36.7 C)-98.9 F (37.2 C)] 98.9 F (37.2 C) (10/02 1245) Pulse Rate:  [80-96] 85 (10/02 1245) Resp:  [18] 18 (10/02 1245) BP: (135-149)/(80-91) 149/87 (10/02 1245) SpO2:  [97 %-99 %] 98 % (10/02 1245)   Assessment/Plan:  Status post left total knee arthroplasty   Mr. Steppe is s/p left TKA from 2020, has worsening joint pain and swelling in the setting of MSSA bacteremia. We were consulted for joint aspiration to r/o PJI. This was performed today at the bedside without difficulty. Unable to pull much fluid from the joint, but this did have a turbid appearance. Sending fluid off for cell count and culture. Will continue to follow. See separate note for procedure details.    Arther Abbott, PA-C Orthopedic Surgery [facility]

## 2023-09-23 NOTE — Procedures (Signed)
After verbal consent was obtained, the left knee was identified and prepared with Chloraprep. The knee was then injected from a superolateral approach with a mixture of 3cc of Lidocaine. Adequate time was allowed for the medication to take effect. An 18 gauge needle was then introduced from the superolateral aspect and then 5 cc's of cloudy, yellow appearing fluid was aspirated. The patient tolerated the procedure well, without complications. The injection site was dressed with an adhesive bandage. The patient was advised to alert the nurse with any adverse reactions. The patient was advised to ice the knee intermittently over the next 24 hours.   Arther Abbott, PA-C Orthopedic Surgery [facility]

## 2023-09-23 NOTE — Consult Note (Signed)
Reason for Consult:Left knee effusion Referring Physician: Rebekah Chesterfield Pokhrel Time called: 1134 Time at bedside: 1439   JATARIUS HARRIEL is an 87 y.o. male.  HPI: Anmol was admitted 2w ago with a fall and head injury. While here developed MSSA bacteremia. He also notes his left knee has been bothering him for quite some time but does agree it's worse in the last few days. Staff note it's also gotten more swollen and orthopedic surgery was asked to see to r/o septic joint.  Past Medical History:  Diagnosis Date  . Arthritis    back,  . DJD (degenerative joint disease)   . Dysrhythmia    A-fib  . Glaucoma   . Hypertension     Past Surgical History:  Procedure Laterality Date  . EYE SURGERY     bil   . JOINT REPLACEMENT    . knee surgery     arthroscopic  . TOTAL KNEE ARTHROPLASTY Left 09/12/2019   Procedure: TOTAL KNEE ARTHROPLASTY;  Surgeon: Ollen Gross, MD;  Location: [facility];  Service: Orthopedics;  Laterality: Left;   . TOTAL KNEE ARTHROPLASTY Right 03/12/2020   Procedure: TOTAL KNEE ARTHROPLASTY;  Surgeon: Ollen Gross, MD;  Location: [facility];  Service: Orthopedics;  Laterality: Right;     Family History  Problem Relation Age of Onset  . Hypertension Other     Social History:  reports that he has quit smoking. His smoking use included cigarettes. He has a 20 pack-year smoking history. He has never used smokeless tobacco. He reports that he does not currently use alcohol. He reports that he does not currently use drugs.  Allergies:  Allergies  Allergen Reactions  . Penicillins Anaphylaxis    Tolerates ancef  Did it involve swelling of the face/tongue/throat, SOB, or low BP? Yes Did it involve sudden or severe rash/hives, skin peeling, or any reaction on the inside of your mouth or nose? Unknown Did you need to seek medical attention at a hospital or doctor's office? Yes When did it last happen?      50+ years If all above answers are "NO", may proceed with  cephalosporin use.     Medications: I have reviewed the patient's current medications.  Results for orders placed or performed during the hospital encounter of 09/08/23 (from the past 48 hour(s))  Glucose, capillary     Status: Abnormal   Collection Time: 09/21/23  5:14 PM  Result Value Ref Range   Glucose-Capillary 170 (H) 70 - 99 mg/dL    Comment: Glucose reference range applies only to samples taken after fasting for at least 8 hours.   Comment 1 Notify RN    Comment 2 Document in Chart   Glucose, capillary     Status: Abnormal   Collection Time: 09/21/23  9:00 PM  Result Value Ref Range   Glucose-Capillary 128 (H) 70 - 99 mg/dL    Comment: Glucose reference range applies only to samples taken after fasting for at least 8 hours.  CBC     Status: Abnormal   Collection Time: 09/22/23  5:11 AM  Result Value Ref Range   WBC 6.9 4.0 - 10.5 K/uL   RBC 4.11 (L) 4.22 - 5.81 MIL/uL   Hemoglobin 12.7 (L) 13.0 - 17.0 g/dL   HCT 24.4 (L) 01.0 - 27.2 %   MCV 92.2 80.0 - 100.0 fL   MCH 30.9 26.0 - 34.0 pg   MCHC 33.5 30.0 - 36.0 g/dL   RDW 53.6 64.4 -  15.5 %   Platelets 101 (L) 150 - 400 K/uL    Comment: REPEATED TO VERIFY   nRBC 0.0 0.0 - 0.2 %    Comment: Performed at [facility], [address], [place], [place] [address]  Comprehensive metabolic panel     Status: Abnormal   Collection Time: 09/22/23  5:11 AM  Result Value Ref Range   Sodium 147 (H) 135 - 145 mmol/L   Potassium 3.6 3.5 - 5.1 mmol/L   Chloride 118 (H) 98 - 111 mmol/L   CO2 19 (L) 22 - 32 mmol/L   Glucose, Bld 127 (H) 70 - 99 mg/dL    Comment: Glucose reference range applies only to samples taken after fasting for at least 8 hours.   BUN 31 (H) 8 - 23 mg/dL   Creatinine, Ser 1.91 (H) 0.61 - 1.24 mg/dL   Calcium 8.0 (L) 8.9 - 10.3 mg/dL   Total Protein 5.6 (L) 6.5 - 8.1 g/dL   Albumin 2.3 (L) 3.5 - 5.0 g/dL   AST 53 (H) 15 - 41 U/L   ALT 52 (H) 0 - 44 U/L   Alkaline Phosphatase 70 38 - 126 U/L   Total  Bilirubin 0.9 0.3 - 1.2 mg/dL   GFR, Estimated 52 (L) >60 mL/min    Comment: (NOTE) Calculated using the CKD-EPI Creatinine Equation (2021)    Anion gap 10 5 - 15    Comment: Performed at [facility], [address], [place], [place] [address]  Magnesium     Status: None   Collection Time: 09/22/23  5:11 AM  Result Value Ref Range   Magnesium 2.2 1.7 - 2.4 mg/dL    Comment: Performed at [facility], [address], [place], [place] [address]  Phosphorus     Status: None   Collection Time: 09/22/23  5:11 AM  Result Value Ref Range   Phosphorus 2.6 2.5 - 4.6 mg/dL    Comment: Performed at [facility], [address], [place], [place] [address]  Lactic acid, plasma     Status: None   Collection Time: 09/22/23  5:11 AM  Result Value Ref Range   Lactic Acid, Venous 1.4 0.5 - 1.9 mmol/L    Comment: Performed at [facility], [address], [place], [place] [address]  Glucose, capillary     Status: Abnormal   Collection Time: 09/22/23  6:09 AM  Result Value Ref Range   Glucose-Capillary 144 (H) 70 - 99 mg/dL    Comment: Glucose reference range applies only to samples taken after fasting for at least 8 hours.  Culture, blood (Routine X 2) w Reflex to ID Panel     Status: None (Preliminary result)   Collection Time: 09/22/23  6:20 AM   Specimen: BLOOD RIGHT HAND  Result Value Ref Range   Specimen Description BLOOD RIGHT HAND    Special Requests      BOTTLES DRAWN AEROBIC AND ANAEROBIC Blood Culture results may not be optimal due to an excessive volume of blood received in culture bottles   Culture      NO GROWTH < 24 HOURS Performed at [facility], [address] [place], [place] [address]    Report Status PENDING   Procalcitonin     Status: None   Collection Time: 09/22/23  6:20 AM  Result Value Ref Range   Procalcitonin 8.09 ng/mL    Comment:  Interpretation: PCT > 2 ng/mL: Systemic infection (sepsis) is likely, unless other causes are  known. (NOTE)       Sepsis PCT Algorithm           Lower Respiratory Tract                                      Infection PCT Algorithm    ----------------------------     ----------------------------         PCT < 0.25 ng/mL                PCT < 0.10 ng/mL          Strongly encourage             Strongly discourage   discontinuation of antibiotics    initiation of antibiotics    ----------------------------     -----------------------------       PCT 0.25 - 0.50 ng/mL            PCT 0.10 - 0.25 ng/mL               OR       >80% decrease in PCT            Discourage initiation of                                            antibiotics      Encourage discontinuation           of antibiotics    ----------------------------     -----------------------------         PCT >= 0.50 ng/mL              PCT 0.26 - 0.50 ng/mL               AND       <80% decrease in PCT              Encourage initiation of                                             antibiotics       Encourage continuation           of antibiotics    ----------------------------     -----------------------------        PCT >= 0.50 ng/mL                  PCT > 0.50 ng/mL               AND         increase in PCT                  Strongly encourage                                      initiation of antibiotics    Strongly encourage escalation           of antibiotics                                     -----------------------------  PCT <= 0.25 ng/mL                                                 OR                                        > 80% decrease in PCT                                      Discontinue / Do not initiate                                             antibiotics  Performed at [facility], [address], [place], [place] [address]   Culture, blood (Routine X 2) w Reflex to ID Panel     Status: None (Preliminary result)   Collection Time: 09/22/23  6:25 AM    Specimen: BLOOD LEFT HAND  Result Value Ref Range   Specimen Description BLOOD LEFT HAND    Special Requests      BOTTLES DRAWN AEROBIC AND ANAEROBIC Blood Culture results may not be optimal due to an excessive volume of blood received in culture bottles   Culture      NO GROWTH < 24 HOURS Performed at [facility], [address], [place], [place] [address]    Report Status PENDING   Glucose, capillary     Status: Abnormal   Collection Time: 09/22/23 11:53 AM  Result Value Ref Range   Glucose-Capillary 198 (H) 70 - 99 mg/dL    Comment: Glucose reference range applies only to samples taken after fasting for at least 8 hours.  Glucose, capillary     Status: Abnormal   Collection Time: 09/22/23  4:47 PM  Result Value Ref Range   Glucose-Capillary 155 (H) 70 - 99 mg/dL    Comment: Glucose reference range applies only to samples taken after fasting for at least 8 hours.   Comment 1 Notify RN    Comment 2 Document in Chart   Glucose, capillary     Status: Abnormal   Collection Time: 09/22/23  9:10 PM  Result Value Ref Range   Glucose-Capillary 141 (H) 70 - 99 mg/dL    Comment: Glucose reference range applies only to samples taken after fasting for at least 8 hours.  CBC     Status: Abnormal   Collection Time: 09/23/23  5:03 AM  Result Value Ref Range   WBC 6.9 4.0 - 10.5 K/uL   RBC 3.93 (L) 4.22 - 5.81 MIL/uL   Hemoglobin 11.6 (L) 13.0 - 17.0 g/dL   HCT 09.8 (L) 11.9 - 14.7 %   MCV 90.3 80.0 - 100.0 fL   MCH 29.5 26.0 - 34.0 pg   MCHC 32.7 30.0 - 36.0 g/dL   RDW 82.9 56.2 - 13.0 %   Platelets 98 (L) 150 - 400 K/uL    Comment: REPEATED TO VERIFY   nRBC 0.0 0.0 - 0.2 %    Comment: Performed at [facility], [address], [place], [place] [address]  Basic metabolic panel     Status: Abnormal   Collection Time: 09/23/23  5:03 AM  Result Value Ref Range   Sodium 141 135 - 145 mmol/L   Potassium 3.2 (L) 3.5 - 5.1 mmol/L   Chloride 113 (H) 98 - 111 mmol/L   CO2 19  (L) 22 - 32 mmol/L   Glucose, Bld 163 (H) 70 - 99 mg/dL    Comment: Glucose reference range applies only to samples taken after fasting for at least 8 hours.   BUN 27 (H) 8 - 23 mg/dL   Creatinine, Ser 6.04 0.61 - 1.24 mg/dL   Calcium 7.9 (L) 8.9 - 10.3 mg/dL   GFR, Estimated >54 >09 mL/min    Comment: (NOTE) Calculated using the CKD-EPI Creatinine Equation (2021)    Anion gap 9 5 - 15    Comment: Performed at [facility], [address] [place], [place] [address]  Procalcitonin     Status: None   Collection Time: 09/23/23  5:03 AM  Result Value Ref Range   Procalcitonin 5.05 ng/mL    Comment:        Interpretation: PCT > 2 ng/mL: Systemic infection (sepsis) is likely, unless other causes are known. (NOTE)       Sepsis PCT Algorithm           Lower Respiratory Tract                                      Infection PCT Algorithm    ----------------------------     ----------------------------         PCT < 0.25 ng/mL                PCT < 0.10 ng/mL          Strongly encourage             Strongly discourage   discontinuation of antibiotics    initiation of antibiotics    ----------------------------     -----------------------------       PCT 0.25 - 0.50 ng/mL            PCT 0.10 - 0.25 ng/mL               OR       >80% decrease in PCT            Discourage initiation of                                            antibiotics      Encourage discontinuation           of antibiotics    ----------------------------     -----------------------------         PCT >= 0.50 ng/mL              PCT 0.26 - 0.50 ng/mL               AND       <80% decrease in PCT              Encourage initiation of  antibiotics       Encourage continuation           of antibiotics    ----------------------------     -----------------------------        PCT >= 0.50 ng/mL                  PCT > 0.50 ng/mL               AND         increase in PCT                   Strongly encourage                                      initiation of antibiotics    Strongly encourage escalation           of antibiotics                                     -----------------------------                                           PCT <= 0.25 ng/mL                                                 OR                                        > 80% decrease in PCT                                      Discontinue / Do not initiate                                             antibiotics  Performed at [facility], [address], [place], [place] [address]   C-reactive protein     Status: Abnormal   Collection Time: 09/23/23  5:03 AM  Result Value Ref Range   CRP 17.1 (H) <1.0 mg/dL    Comment: Performed at [facility], [address] [place], [place] [address]  Sedimentation rate     Status: Abnormal   Collection Time: 09/23/23  5:03 AM  Result Value Ref Range   Sed Rate 35 (H) 0 - 16 mm/hr    Comment: Performed at [facility], [address] [place], [place] [address]  Glucose, capillary     Status: Abnormal   Collection Time: 09/23/23  6:06 AM  Result Value Ref Range   Glucose-Capillary 146 (H) 70 - 99 mg/dL    Comment: Glucose reference range applies only to samples taken after fasting for at least 8 hours.  Glucose, capillary     Status: Abnormal  Collection Time: 09/23/23 12:18 PM  Result Value Ref Range   Glucose-Capillary 167 (H) 70 - 99 mg/dL    Comment: Glucose reference range applies only to samples taken after fasting for at least 8 hours.    DG Knee 3 Views Left  Result Date: 09/22/2023 CLINICAL DATA:  Left knee pain EXAM: LEFT KNEE - 3 VIEW COMPARISON:  None Available. FINDINGS: Prior left knee replacement. No hardware complicating feature. Moderate joint effusion. No acute bony abnormality. Specifically, no fracture, subluxation, or dislocation. IMPRESSION: Left knee replacement. Moderate joint effusion. No acute bony  abnormality. Electronically Signed   By: Charlett Nose M.D.   On: 09/22/2023 18:07   CT CHEST ABDOMEN PELVIS W CONTRAST  Result Date: 09/21/2023 CLINICAL DATA:  Sepsis EXAM: CT CHEST, ABDOMEN, AND PELVIS WITH CONTRAST TECHNIQUE: Multidetector CT imaging of the chest, abdomen and pelvis was performed following the standard protocol during bolus administration of intravenous contrast. RADIATION DOSE REDUCTION: This exam was performed according to the departmental dose-optimization program which includes automated exposure control, adjustment of the mA and/or kV according to patient size and/or use of iterative reconstruction technique. CONTRAST:  75mL OMNIPAQUE IOHEXOL 350 MG/ML SOLN COMPARISON:  09/20/2023 FINDINGS: CT CHEST FINDINGS Cardiovascular: Atherosclerotic calcifications of the thoracic aorta are noted. Mild dilatation of the ascending aorta to 4.1 cm is noted. No dissection is seen. The pulmonary artery shows a normal branching pattern. No large central pulmonary embolus is noted. Coronary calcifications are noted. No cardiac enlargement is seen. Mediastinum/Nodes: Thoracic inlet is within normal limits. No hilar or mediastinal adenopathy is noted. The esophagus as visualized is within normal limits. Lungs/Pleura: Mild left lower lobe atelectatic changes are seen. No focal confluent infiltrate or sizable effusion is noted. Musculoskeletal: Degenerative changes of the thoracic spine are seen. No acute rib abnormality is noted. CT ABDOMEN PELVIS FINDINGS Hepatobiliary: Fatty infiltration of the liver is noted. Gallbladder is within normal limits. Pancreas: Unremarkable. No pancreatic ductal dilatation or surrounding inflammatory changes. Spleen: Normal in size without focal abnormality. Adrenals/Urinary Tract: Adrenal glands are within normal limits. Multiple simple appearing cysts are noted throughout both kidneys stable in appearance from the prior exam. No further follow-up is recommended. Scattered  nonobstructing renal stones are seen. The ureters are within normal limits. The bladder is decompressed. Stomach/Bowel: The appendix is within normal limits. No obstructive or inflammatory changes of the colon are seen. The stomach and small bowel are within normal limits. Vascular/Lymphatic: Aortic atherosclerosis. No enlarged abdominal or pelvic lymph nodes. Reproductive: Prostate is unremarkable. Other: No abdominal wall hernia or abnormality. No abdominopelvic ascites. Musculoskeletal: Degenerative changes of lumbar spine are noted. IMPRESSION: CT of the chest: Dilatation of the ascending aorta to 4.1 cm. Recommend annual imaging followup by CTA or MRA. This recommendation follows 2010 ACCF/AHA/AATS/ACR/ASA/SCA/SCAI/SIR/STS/SVM Guidelines for the Diagnosis and Management of Patients with Thoracic Aortic Disease. Circulation. 2010; 121: O536-U440. Aortic aneurysm NOS (ICD10-I71.9) Mild left basilar atelectasis. CT of the abdomen and pelvis: Fatty infiltration of the liver. Nonobstructing renal calculi bilaterally. The largest of these is noted on the left measuring approximately 5 mm. Electronically Signed   By: Alcide Clever M.D.   On: 09/21/2023 21:33   ECHOCARDIOGRAM COMPLETE  Result Date: 09/21/2023    ECHOCARDIOGRAM REPORT   Patient Name:   Mathew Simpson Date of Exam: 09/21/2023 Medical Rec #:  [id number]     Height:       70.0 in Accession #:    [id number]    Weight:  195.0 lb Date of Birth:  1936-12-18      BSA:          2.065 m Patient Age:    87 years      BP:           107/74 mmHg Patient Gender: M             HR:           75 bpm. Exam Location:  Inpatient Procedure: 2D Echo, Cardiac Doppler and Color Doppler Indications:    Bacteremia R78.81  History:        Patient has prior history of Echocardiogram examinations, most                 recent 09/15/2023. Arrythmias:Atrial Fibrillation; Risk                 Factors:Diabetes, Hypertension and Former Smoker.  Sonographer:    Dondra Prader RVT RCS  Referring Phys: [id number] ERIC J [place]  Sonographer Comments: Technically challenging study due to limited acoustic windows, Technically difficult study due to poor echo windows, suboptimal parasternal window and suboptimal apical window. Image acquisition challenging due to uncooperative patient and Image acquisition challenging due to respiratory motion. Patient supine. Unable to complete study; patient kept pushing probe away. IMPRESSIONS  1. Left ventricular ejection fraction, by estimation, is 55 to 60%. The left ventricle has normal function. The left ventricle has no regional wall motion abnormalities. There is moderate concentric left ventricular hypertrophy. Left ventricular diastolic parameters are indeterminate.  2. Peak RV-RA gradient 18 mmHg. Right ventricular systolic function is mildly reduced. The right ventricular size is normal.  3. Left atrial size was severely dilated.  4. Right atrial size was mildly dilated.  5. The mitral valve is normal in structure. No evidence of mitral valve regurgitation. No evidence of mitral stenosis.  6. The aortic valve is tricuspid. Aortic valve regurgitation is trivial.  7. Aortic dilatation noted. There is moderate dilatation of the ascending aorta, measuring 45 mm.  8. IVC not visualized.  9. The patient was in atrial fibrillation. 10. Technically difficult study. FINDINGS  Left Ventricle: Left ventricular ejection fraction, by estimation, is 55 to 60%. The left ventricle has normal function. The left ventricle has no regional wall motion abnormalities. The left ventricular internal cavity size was normal in size. There is  moderate concentric left ventricular hypertrophy. Left ventricular diastolic parameters are indeterminate. Right Ventricle: Peak RV-RA gradient 18 mmHg. The right ventricular size is normal. No increase in right ventricular wall thickness. Right ventricular systolic function is mildly reduced. Left Atrium: Left atrial size was severely dilated.  Right Atrium: Right atrial size was mildly dilated. Pericardium: There is no evidence of pericardial effusion. Mitral Valve: The mitral valve is normal in structure. Mild mitral annular calcification. No evidence of mitral valve regurgitation. No evidence of mitral valve stenosis. Tricuspid Valve: The tricuspid valve is normal in structure. Tricuspid valve regurgitation is trivial. Aortic Valve: The aortic valve is tricuspid. Aortic valve regurgitation is trivial. Aortic valve mean gradient measures 2.0 mmHg. Aortic valve peak gradient measures 3.6 mmHg. Aortic valve area, by VTI measures 3.43 cm. Pulmonic Valve: The pulmonic valve was not well visualized. Pulmonic valve regurgitation is not visualized. Aorta: Aortic dilatation noted. There is moderate dilatation of the ascending aorta, measuring 45 mm. Venous: IVC not visualized. IAS/Shunts: No atrial level shunt detected by color flow Doppler.  LEFT VENTRICLE PLAX 2D LVIDd:  4.17 cm   Diastology LVIDs:         2.80 cm   LV e' medial:    7.51 cm/s LV PW:         1.50 cm   LV E/e' medial:  9.4 LV IVS:        1.55 cm   LV e' lateral:   9.46 cm/s LVOT diam:     2.00 cm   LV E/e' lateral: 7.5 LV SV:         53 LV SV Index:   26 LVOT Area:     3.14 cm  RIGHT VENTRICLE RV S prime:     9.46 cm/s TAPSE (M-mode): 1.9 cm LEFT ATRIUM              Index LA diam:        3.40 cm  1.65 cm/m LA Vol (A2C):   120.0 ml 58.11 ml/m LA Vol (A4C):   128.0 ml 61.99 ml/m LA Biplane Vol: 129.0 ml 62.47 ml/m  AORTIC VALVE                    PULMONIC VALVE AV Area (Vmax):    3.55 cm     PV Vmax:       0.57 m/s AV Area (Vmean):   3.70 cm     PV Peak grad:  1.3 mmHg AV Area (VTI):     3.43 cm AV Vmax:           94.80 cm/s AV Vmean:          60.800 cm/s AV VTI:            0.154 m AV Peak Grad:      3.6 mmHg AV Mean Grad:      2.0 mmHg LVOT Vmax:         107.00 cm/s LVOT Vmean:        71.600 cm/s LVOT VTI:          0.168 m LVOT/AV VTI ratio: 1.09  AORTA Ao Root diam: 4.30 cm Ao  Asc diam:  4.50 cm MITRAL VALVE               TRICUSPID VALVE MV Area (PHT): 4.77 cm    TR Peak grad:   18.8 mmHg MV Decel Time: 159 msec    TR Vmax:        217.00 cm/s MV E velocity: 70.70 cm/s MV A velocity: 21.40 cm/s  SHUNTS MV E/A ratio:  3.30        Systemic VTI:  0.17 m                            Systemic Diam: 2.00 cm Dalton McleanMD Electronically signed by Wilfred Lacy Signature Date/Time: 09/21/2023/4:22:53 PM    Final     Review of Systems  Constitutional:  Negative for chills, diaphoresis and fever.  HENT:  Negative for ear discharge, ear pain, hearing loss and tinnitus.   Eyes:  Negative for photophobia and pain.  Respiratory:  Negative for cough and shortness of breath.   Cardiovascular:  Negative for chest pain.  Gastrointestinal:  Negative for abdominal pain, nausea and vomiting.  Genitourinary:  Negative for dysuria, flank pain, frequency and urgency.  Musculoskeletal:  Positive for arthralgias (Left knee). Negative for back pain, myalgias and neck pain.  Neurological:  Negative for dizziness and headaches.  Hematological:  Does not bruise/bleed easily.  Psychiatric/Behavioral:  The patient is not nervous/anxious.    Blood pressure (!) 149/87, pulse 85, temperature 98.9 F (37.2 C), temperature source Oral, resp. rate 18, height 5\' 10"  (1.778 m), weight 88.5 kg, SpO2 98%. Physical Exam Constitutional:      General: He is not in acute distress.    Appearance: He is well-developed. He is not diaphoretic.  HENT:     Head: Normocephalic and atraumatic.  Eyes:     General: No scleral icterus.       Right eye: No discharge.        Left eye: No discharge.     Conjunctiva/sclera: Conjunctivae normal.  Cardiovascular:     Rate and Rhythm: Normal rate and regular rhythm.  Pulmonary:     Effort: Pulmonary effort is normal. No respiratory distress.  Musculoskeletal:     Cervical back: Normal range of motion.     Comments: LLE No traumatic wounds, ecchymosis, or rash  Mod  TTP diffusely about knee, AROM very painful, would let me PROM from 180 to 150 with minimal pain  Mod knee effusion  Sens DPN, SPN, TN intact  Motor EHL, ext, flex, evers 5/5  DP 2+, PT 2+, No significant edema  Skin:    General: Skin is warm and dry.  Neurological:     Mental Status: He is alert.  Psychiatric:        Mood and Affect: Mood normal.        Behavior: Behavior normal.    Assessment/Plan: Left knee effusion -- Plan arthrocentesis later today.    Freeman Caldron, PA-C Orthopedic Surgery [phone] 09/23/2023, 2:50 PM

## 2023-09-23 NOTE — Progress Notes (Signed)
Mediastinum/Nodes: Thoracic inlet is within normal limits. No hilar or mediastinal adenopathy is noted. The esophagus as visualized is within normal limits. Lungs/Pleura: Mild left lower lobe atelectatic changes are seen. No focal confluent  infiltrate or sizable effusion is noted. Musculoskeletal: Degenerative changes of the thoracic spine are seen. No acute rib abnormality is noted. CT ABDOMEN PELVIS FINDINGS Hepatobiliary: Fatty infiltration of the liver is noted. Gallbladder is within normal limits. Pancreas: Unremarkable. No pancreatic ductal dilatation or surrounding inflammatory changes. Spleen: Normal in size without focal abnormality. Adrenals/Urinary Tract: Adrenal glands are within normal limits. Multiple simple appearing cysts are noted throughout both kidneys stable in appearance from the prior exam. No further follow-up is recommended. Scattered nonobstructing renal stones are seen. The ureters are within normal limits. The bladder is decompressed. Stomach/Bowel: The appendix is within normal limits. No obstructive or inflammatory changes of the colon are seen. The stomach and small bowel are within normal limits. Vascular/Lymphatic: Aortic atherosclerosis. No enlarged abdominal or pelvic lymph nodes. Reproductive: Prostate is unremarkable. Other: No abdominal wall hernia or abnormality. No abdominopelvic ascites. Musculoskeletal: Degenerative changes of lumbar spine are noted. IMPRESSION: CT of the chest: Dilatation of the ascending aorta to 4.1 cm. Recommend annual imaging followup by CTA or MRA. This recommendation follows 2010 ACCF/AHA/AATS/ACR/ASA/SCA/SCAI/SIR/STS/SVM Guidelines for the Diagnosis and Management of Patients with Thoracic Aortic Disease. Circulation. 2010; 121: R604-V409. Aortic aneurysm NOS (ICD10-I71.9) Mild left basilar atelectasis. CT of the abdomen and pelvis: Fatty infiltration of the liver. Nonobstructing renal calculi bilaterally. The largest of these is noted on the left measuring approximately 5 mm. Electronically Signed   By: Alcide Clever M.D.   On: 09/21/2023 21:33   ECHOCARDIOGRAM COMPLETE  Result Date: 09/21/2023    ECHOCARDIOGRAM REPORT   Patient Name:   Mathew Simpson Date of Exam: 09/21/2023 Medical  Rec #:  [id number]     Height:       70.0 in Accession #:    [id number]    Weight:       195.0 lb Date of Birth:  1936/04/05      BSA:          2.065 m Patient Age:    87 years      BP:           107/74 mmHg Patient Gender: M             HR:           75 bpm. Exam Location:  Inpatient Procedure: 2D Echo, Cardiac Doppler and Color Doppler Indications:    Bacteremia R78.81  History:        Patient has prior history of Echocardiogram examinations, most                 recent 09/15/2023. Arrythmias:Atrial Fibrillation; Risk                 Factors:Diabetes, Hypertension and Former Smoker.  Sonographer:    Dondra Prader RVT RCS Referring Phys: [id number] ERIC J [place]  Sonographer Comments: Technically challenging study due to limited acoustic windows, Technically difficult study due to poor echo windows, suboptimal parasternal window and suboptimal apical window. Image acquisition challenging due to uncooperative patient and Image acquisition challenging due to respiratory motion. Patient supine. Unable to complete study; patient kept pushing probe away. IMPRESSIONS  1. Left ventricular ejection fraction, by estimation, is 55 to 60%. The left ventricle has normal function. The left ventricle has no regional wall motion abnormalities. There is moderate concentric left  PROGRESS NOTE    ZAKAI GONYEA  WUJ:[id number] DOB: 19-Jan-1936 DOA: 09/08/2023 PCP: [name], PA-C    Brief Narrative:   Mathew Simpson is a 87 y.o. male with past medical history of  persistent atrial fibrillation on Eliquis, HTN, type 2 diabetes mellitus, vertigo who presented to the [facility] ED on 9/17 via EMS from home after mechanical fall at home, might have fallen backwards with loss of consciousness.  Found by neighbor.  Patient was brought in by EMS and was having dizziness nausea vomiting.  Was on Eliquis as outpatient.  In the ED patient had stable vitals.  Labs showed creatinine of 1.3.  Alcohol level less than 10.  Procalcitonin less than 0.10.  CK 75.  Chest x-ray with low lung volumes.  Pelvis x-ray negative.  CT head and C-spine without contrast with 3 mm subdural hematoma anterior falx, no midline shift, 6 mm hemorrhagic contusion posterior left frontal lobe, asymmetric widening of the space between the dens and left lateral mass of the C1 relative the right lateral mass. Patient was seen by neurosurgery, follow-up CT scans demonstrate stable appearance small anterior falcine subdural and left frontal contusion does not require any acute intervention. Dr. Conchita Paris; with neurosurgery recommended  holding Eliquis for 2 weeks, no requirement of cervical immobilization and outpatient follow-up in neurosurg clinic as needed.    Assessment & Plan:   Subdural hematoma Traumatic brain injury Left frontal contusion  Scalp laceration Mechanical fall  Neurosurgery on board.  Conservative treatment at this time.  Recommendation of holding Eliquis for 2 weeks, no requirement of cervical immobilization and outpatient follow-up in neurosurg clinic as needed.  Staples removed on 09/18/2023.  CT head 09/20/23 with interval decrease in subdural hematoma. Continue to hold Eliquis x 2 weeks  MSSA Bacteremia Temperature max of 99.4 F today.  CT scan with improving subdural hematoma.  Urinalysis  negative.  Chest x-ray without any infiltrate.  Lactate was 2.7.  CT scan of the chest abdomen pelvis without any acute findings.  TTE with LVEF 55-60%, RV systolic function mildly decreased, biatrial enlargement, aortic diet lesion 45 mm, no notation of valvular vegetation although technically difficult study.  ID was consulted.  Blood cultures from 09/20/2023 with MSSA, repeat blood cultures from 09/22/2023 negative in less than 24 hours.  Procalcitonin elevated at 5.5 but trending down. MRSA PCR negative.  Currently on Cefazolin 2 g IV every 8 hours.  Continue supportive care.  Had some pain over the left knee  and x-ray done yesterday showed features of left knee replacement with moderate joint effusion.  Unable to weight-bear.  He recommends a possible arthrocentesis.  Orthopedics has been consulted.  Persistent atrial fibrillation with sinus pauses Seen by cardiology during hospitalization.  2D echocardiogram with LVEF 65 diastolic percent, normal RV function, mild biatrial enlargement, mild AI, dilated aortic root measuring 43 mm.  No indication for permanent pacemaker placement.  Cardizem dose has been decreased to 120 mg daily..  Recommended Zio patch for 2 weeks on discharge.  Essential hypertension Continue diltiazem and Irbesartan.  Blood pressure seems to be stable.  Vertigo -- Continue PT  Type 2 diabetes mellitus Latest Hemoglobin A1c 6.7.  Continue sliding scale insulin.  Moderate protein calorie malnutrition Body mass index is 27.98 kg/m. Nutrition Status: Nutrition Problem: Moderate Malnutrition Etiology: social / environmental circumstances Signs/Symptoms: mild fat depletion, mild muscle depletion Interventions: Ensure Enlive (each supplement provides 350kcal and 20 grams of protein), MVI Seen by dietitian.  Continue supplementation.  PROGRESS NOTE    ZAKAI GONYEA  WUJ:[id number] DOB: 19-Jan-1936 DOA: 09/08/2023 PCP: [name], PA-C    Brief Narrative:   Mathew Simpson is a 87 y.o. male with past medical history of  persistent atrial fibrillation on Eliquis, HTN, type 2 diabetes mellitus, vertigo who presented to the [facility] ED on 9/17 via EMS from home after mechanical fall at home, might have fallen backwards with loss of consciousness.  Found by neighbor.  Patient was brought in by EMS and was having dizziness nausea vomiting.  Was on Eliquis as outpatient.  In the ED patient had stable vitals.  Labs showed creatinine of 1.3.  Alcohol level less than 10.  Procalcitonin less than 0.10.  CK 75.  Chest x-ray with low lung volumes.  Pelvis x-ray negative.  CT head and C-spine without contrast with 3 mm subdural hematoma anterior falx, no midline shift, 6 mm hemorrhagic contusion posterior left frontal lobe, asymmetric widening of the space between the dens and left lateral mass of the C1 relative the right lateral mass. Patient was seen by neurosurgery, follow-up CT scans demonstrate stable appearance small anterior falcine subdural and left frontal contusion does not require any acute intervention. Dr. Conchita Paris; with neurosurgery recommended  holding Eliquis for 2 weeks, no requirement of cervical immobilization and outpatient follow-up in neurosurg clinic as needed.    Assessment & Plan:   Subdural hematoma Traumatic brain injury Left frontal contusion  Scalp laceration Mechanical fall  Neurosurgery on board.  Conservative treatment at this time.  Recommendation of holding Eliquis for 2 weeks, no requirement of cervical immobilization and outpatient follow-up in neurosurg clinic as needed.  Staples removed on 09/18/2023.  CT head 09/20/23 with interval decrease in subdural hematoma. Continue to hold Eliquis x 2 weeks  MSSA Bacteremia Temperature max of 99.4 F today.  CT scan with improving subdural hematoma.  Urinalysis  negative.  Chest x-ray without any infiltrate.  Lactate was 2.7.  CT scan of the chest abdomen pelvis without any acute findings.  TTE with LVEF 55-60%, RV systolic function mildly decreased, biatrial enlargement, aortic diet lesion 45 mm, no notation of valvular vegetation although technically difficult study.  ID was consulted.  Blood cultures from 09/20/2023 with MSSA, repeat blood cultures from 09/22/2023 negative in less than 24 hours.  Procalcitonin elevated at 5.5 but trending down. MRSA PCR negative.  Currently on Cefazolin 2 g IV every 8 hours.  Continue supportive care.  Had some pain over the left knee  and x-ray done yesterday showed features of left knee replacement with moderate joint effusion.  Unable to weight-bear.  He recommends a possible arthrocentesis.  Orthopedics has been consulted.  Persistent atrial fibrillation with sinus pauses Seen by cardiology during hospitalization.  2D echocardiogram with LVEF 65 diastolic percent, normal RV function, mild biatrial enlargement, mild AI, dilated aortic root measuring 43 mm.  No indication for permanent pacemaker placement.  Cardizem dose has been decreased to 120 mg daily..  Recommended Zio patch for 2 weeks on discharge.  Essential hypertension Continue diltiazem and Irbesartan.  Blood pressure seems to be stable.  Vertigo -- Continue PT  Type 2 diabetes mellitus Latest Hemoglobin A1c 6.7.  Continue sliding scale insulin.  Moderate protein calorie malnutrition Body mass index is 27.98 kg/m. Nutrition Status: Nutrition Problem: Moderate Malnutrition Etiology: social / environmental circumstances Signs/Symptoms: mild fat depletion, mild muscle depletion Interventions: Ensure Enlive (each supplement provides 350kcal and 20 grams of protein), MVI Seen by dietitian.  Continue supplementation.  Mediastinum/Nodes: Thoracic inlet is within normal limits. No hilar or mediastinal adenopathy is noted. The esophagus as visualized is within normal limits. Lungs/Pleura: Mild left lower lobe atelectatic changes are seen. No focal confluent  infiltrate or sizable effusion is noted. Musculoskeletal: Degenerative changes of the thoracic spine are seen. No acute rib abnormality is noted. CT ABDOMEN PELVIS FINDINGS Hepatobiliary: Fatty infiltration of the liver is noted. Gallbladder is within normal limits. Pancreas: Unremarkable. No pancreatic ductal dilatation or surrounding inflammatory changes. Spleen: Normal in size without focal abnormality. Adrenals/Urinary Tract: Adrenal glands are within normal limits. Multiple simple appearing cysts are noted throughout both kidneys stable in appearance from the prior exam. No further follow-up is recommended. Scattered nonobstructing renal stones are seen. The ureters are within normal limits. The bladder is decompressed. Stomach/Bowel: The appendix is within normal limits. No obstructive or inflammatory changes of the colon are seen. The stomach and small bowel are within normal limits. Vascular/Lymphatic: Aortic atherosclerosis. No enlarged abdominal or pelvic lymph nodes. Reproductive: Prostate is unremarkable. Other: No abdominal wall hernia or abnormality. No abdominopelvic ascites. Musculoskeletal: Degenerative changes of lumbar spine are noted. IMPRESSION: CT of the chest: Dilatation of the ascending aorta to 4.1 cm. Recommend annual imaging followup by CTA or MRA. This recommendation follows 2010 ACCF/AHA/AATS/ACR/ASA/SCA/SCAI/SIR/STS/SVM Guidelines for the Diagnosis and Management of Patients with Thoracic Aortic Disease. Circulation. 2010; 121: R604-V409. Aortic aneurysm NOS (ICD10-I71.9) Mild left basilar atelectasis. CT of the abdomen and pelvis: Fatty infiltration of the liver. Nonobstructing renal calculi bilaterally. The largest of these is noted on the left measuring approximately 5 mm. Electronically Signed   By: Alcide Clever M.D.   On: 09/21/2023 21:33   ECHOCARDIOGRAM COMPLETE  Result Date: 09/21/2023    ECHOCARDIOGRAM REPORT   Patient Name:   Mathew Simpson Date of Exam: 09/21/2023 Medical  Rec #:  [id number]     Height:       70.0 in Accession #:    [id number]    Weight:       195.0 lb Date of Birth:  1936/04/05      BSA:          2.065 m Patient Age:    87 years      BP:           107/74 mmHg Patient Gender: M             HR:           75 bpm. Exam Location:  Inpatient Procedure: 2D Echo, Cardiac Doppler and Color Doppler Indications:    Bacteremia R78.81  History:        Patient has prior history of Echocardiogram examinations, most                 recent 09/15/2023. Arrythmias:Atrial Fibrillation; Risk                 Factors:Diabetes, Hypertension and Former Smoker.  Sonographer:    Dondra Prader RVT RCS Referring Phys: [id number] ERIC J [place]  Sonographer Comments: Technically challenging study due to limited acoustic windows, Technically difficult study due to poor echo windows, suboptimal parasternal window and suboptimal apical window. Image acquisition challenging due to uncooperative patient and Image acquisition challenging due to respiratory motion. Patient supine. Unable to complete study; patient kept pushing probe away. IMPRESSIONS  1. Left ventricular ejection fraction, by estimation, is 55 to 60%. The left ventricle has normal function. The left ventricle has no regional wall motion abnormalities. There is moderate concentric left  Mediastinum/Nodes: Thoracic inlet is within normal limits. No hilar or mediastinal adenopathy is noted. The esophagus as visualized is within normal limits. Lungs/Pleura: Mild left lower lobe atelectatic changes are seen. No focal confluent  infiltrate or sizable effusion is noted. Musculoskeletal: Degenerative changes of the thoracic spine are seen. No acute rib abnormality is noted. CT ABDOMEN PELVIS FINDINGS Hepatobiliary: Fatty infiltration of the liver is noted. Gallbladder is within normal limits. Pancreas: Unremarkable. No pancreatic ductal dilatation or surrounding inflammatory changes. Spleen: Normal in size without focal abnormality. Adrenals/Urinary Tract: Adrenal glands are within normal limits. Multiple simple appearing cysts are noted throughout both kidneys stable in appearance from the prior exam. No further follow-up is recommended. Scattered nonobstructing renal stones are seen. The ureters are within normal limits. The bladder is decompressed. Stomach/Bowel: The appendix is within normal limits. No obstructive or inflammatory changes of the colon are seen. The stomach and small bowel are within normal limits. Vascular/Lymphatic: Aortic atherosclerosis. No enlarged abdominal or pelvic lymph nodes. Reproductive: Prostate is unremarkable. Other: No abdominal wall hernia or abnormality. No abdominopelvic ascites. Musculoskeletal: Degenerative changes of lumbar spine are noted. IMPRESSION: CT of the chest: Dilatation of the ascending aorta to 4.1 cm. Recommend annual imaging followup by CTA or MRA. This recommendation follows 2010 ACCF/AHA/AATS/ACR/ASA/SCA/SCAI/SIR/STS/SVM Guidelines for the Diagnosis and Management of Patients with Thoracic Aortic Disease. Circulation. 2010; 121: R604-V409. Aortic aneurysm NOS (ICD10-I71.9) Mild left basilar atelectasis. CT of the abdomen and pelvis: Fatty infiltration of the liver. Nonobstructing renal calculi bilaterally. The largest of these is noted on the left measuring approximately 5 mm. Electronically Signed   By: Alcide Clever M.D.   On: 09/21/2023 21:33   ECHOCARDIOGRAM COMPLETE  Result Date: 09/21/2023    ECHOCARDIOGRAM REPORT   Patient Name:   Mathew Simpson Date of Exam: 09/21/2023 Medical  Rec #:  [id number]     Height:       70.0 in Accession #:    [id number]    Weight:       195.0 lb Date of Birth:  1936/04/05      BSA:          2.065 m Patient Age:    87 years      BP:           107/74 mmHg Patient Gender: M             HR:           75 bpm. Exam Location:  Inpatient Procedure: 2D Echo, Cardiac Doppler and Color Doppler Indications:    Bacteremia R78.81  History:        Patient has prior history of Echocardiogram examinations, most                 recent 09/15/2023. Arrythmias:Atrial Fibrillation; Risk                 Factors:Diabetes, Hypertension and Former Smoker.  Sonographer:    Dondra Prader RVT RCS Referring Phys: [id number] ERIC J [place]  Sonographer Comments: Technically challenging study due to limited acoustic windows, Technically difficult study due to poor echo windows, suboptimal parasternal window and suboptimal apical window. Image acquisition challenging due to uncooperative patient and Image acquisition challenging due to respiratory motion. Patient supine. Unable to complete study; patient kept pushing probe away. IMPRESSIONS  1. Left ventricular ejection fraction, by estimation, is 55 to 60%. The left ventricle has normal function. The left ventricle has no regional wall motion abnormalities. There is moderate concentric left  PROGRESS NOTE    ZAKAI GONYEA  WUJ:[id number] DOB: 19-Jan-1936 DOA: 09/08/2023 PCP: [name], PA-C    Brief Narrative:   Mathew Simpson is a 87 y.o. male with past medical history of  persistent atrial fibrillation on Eliquis, HTN, type 2 diabetes mellitus, vertigo who presented to the [facility] ED on 9/17 via EMS from home after mechanical fall at home, might have fallen backwards with loss of consciousness.  Found by neighbor.  Patient was brought in by EMS and was having dizziness nausea vomiting.  Was on Eliquis as outpatient.  In the ED patient had stable vitals.  Labs showed creatinine of 1.3.  Alcohol level less than 10.  Procalcitonin less than 0.10.  CK 75.  Chest x-ray with low lung volumes.  Pelvis x-ray negative.  CT head and C-spine without contrast with 3 mm subdural hematoma anterior falx, no midline shift, 6 mm hemorrhagic contusion posterior left frontal lobe, asymmetric widening of the space between the dens and left lateral mass of the C1 relative the right lateral mass. Patient was seen by neurosurgery, follow-up CT scans demonstrate stable appearance small anterior falcine subdural and left frontal contusion does not require any acute intervention. Dr. Conchita Paris; with neurosurgery recommended  holding Eliquis for 2 weeks, no requirement of cervical immobilization and outpatient follow-up in neurosurg clinic as needed.    Assessment & Plan:   Subdural hematoma Traumatic brain injury Left frontal contusion  Scalp laceration Mechanical fall  Neurosurgery on board.  Conservative treatment at this time.  Recommendation of holding Eliquis for 2 weeks, no requirement of cervical immobilization and outpatient follow-up in neurosurg clinic as needed.  Staples removed on 09/18/2023.  CT head 09/20/23 with interval decrease in subdural hematoma. Continue to hold Eliquis x 2 weeks  MSSA Bacteremia Temperature max of 99.4 F today.  CT scan with improving subdural hematoma.  Urinalysis  negative.  Chest x-ray without any infiltrate.  Lactate was 2.7.  CT scan of the chest abdomen pelvis without any acute findings.  TTE with LVEF 55-60%, RV systolic function mildly decreased, biatrial enlargement, aortic diet lesion 45 mm, no notation of valvular vegetation although technically difficult study.  ID was consulted.  Blood cultures from 09/20/2023 with MSSA, repeat blood cultures from 09/22/2023 negative in less than 24 hours.  Procalcitonin elevated at 5.5 but trending down. MRSA PCR negative.  Currently on Cefazolin 2 g IV every 8 hours.  Continue supportive care.  Had some pain over the left knee  and x-ray done yesterday showed features of left knee replacement with moderate joint effusion.  Unable to weight-bear.  He recommends a possible arthrocentesis.  Orthopedics has been consulted.  Persistent atrial fibrillation with sinus pauses Seen by cardiology during hospitalization.  2D echocardiogram with LVEF 65 diastolic percent, normal RV function, mild biatrial enlargement, mild AI, dilated aortic root measuring 43 mm.  No indication for permanent pacemaker placement.  Cardizem dose has been decreased to 120 mg daily..  Recommended Zio patch for 2 weeks on discharge.  Essential hypertension Continue diltiazem and Irbesartan.  Blood pressure seems to be stable.  Vertigo -- Continue PT  Type 2 diabetes mellitus Latest Hemoglobin A1c 6.7.  Continue sliding scale insulin.  Moderate protein calorie malnutrition Body mass index is 27.98 kg/m. Nutrition Status: Nutrition Problem: Moderate Malnutrition Etiology: social / environmental circumstances Signs/Symptoms: mild fat depletion, mild muscle depletion Interventions: Ensure Enlive (each supplement provides 350kcal and 20 grams of protein), MVI Seen by dietitian.  Continue supplementation.  PROGRESS NOTE    ZAKAI GONYEA  WUJ:[id number] DOB: 19-Jan-1936 DOA: 09/08/2023 PCP: [name], PA-C    Brief Narrative:   Mathew Simpson is a 87 y.o. male with past medical history of  persistent atrial fibrillation on Eliquis, HTN, type 2 diabetes mellitus, vertigo who presented to the [facility] ED on 9/17 via EMS from home after mechanical fall at home, might have fallen backwards with loss of consciousness.  Found by neighbor.  Patient was brought in by EMS and was having dizziness nausea vomiting.  Was on Eliquis as outpatient.  In the ED patient had stable vitals.  Labs showed creatinine of 1.3.  Alcohol level less than 10.  Procalcitonin less than 0.10.  CK 75.  Chest x-ray with low lung volumes.  Pelvis x-ray negative.  CT head and C-spine without contrast with 3 mm subdural hematoma anterior falx, no midline shift, 6 mm hemorrhagic contusion posterior left frontal lobe, asymmetric widening of the space between the dens and left lateral mass of the C1 relative the right lateral mass. Patient was seen by neurosurgery, follow-up CT scans demonstrate stable appearance small anterior falcine subdural and left frontal contusion does not require any acute intervention. Dr. Conchita Paris; with neurosurgery recommended  holding Eliquis for 2 weeks, no requirement of cervical immobilization and outpatient follow-up in neurosurg clinic as needed.    Assessment & Plan:   Subdural hematoma Traumatic brain injury Left frontal contusion  Scalp laceration Mechanical fall  Neurosurgery on board.  Conservative treatment at this time.  Recommendation of holding Eliquis for 2 weeks, no requirement of cervical immobilization and outpatient follow-up in neurosurg clinic as needed.  Staples removed on 09/18/2023.  CT head 09/20/23 with interval decrease in subdural hematoma. Continue to hold Eliquis x 2 weeks  MSSA Bacteremia Temperature max of 99.4 F today.  CT scan with improving subdural hematoma.  Urinalysis  negative.  Chest x-ray without any infiltrate.  Lactate was 2.7.  CT scan of the chest abdomen pelvis without any acute findings.  TTE with LVEF 55-60%, RV systolic function mildly decreased, biatrial enlargement, aortic diet lesion 45 mm, no notation of valvular vegetation although technically difficult study.  ID was consulted.  Blood cultures from 09/20/2023 with MSSA, repeat blood cultures from 09/22/2023 negative in less than 24 hours.  Procalcitonin elevated at 5.5 but trending down. MRSA PCR negative.  Currently on Cefazolin 2 g IV every 8 hours.  Continue supportive care.  Had some pain over the left knee  and x-ray done yesterday showed features of left knee replacement with moderate joint effusion.  Unable to weight-bear.  He recommends a possible arthrocentesis.  Orthopedics has been consulted.  Persistent atrial fibrillation with sinus pauses Seen by cardiology during hospitalization.  2D echocardiogram with LVEF 65 diastolic percent, normal RV function, mild biatrial enlargement, mild AI, dilated aortic root measuring 43 mm.  No indication for permanent pacemaker placement.  Cardizem dose has been decreased to 120 mg daily..  Recommended Zio patch for 2 weeks on discharge.  Essential hypertension Continue diltiazem and Irbesartan.  Blood pressure seems to be stable.  Vertigo -- Continue PT  Type 2 diabetes mellitus Latest Hemoglobin A1c 6.7.  Continue sliding scale insulin.  Moderate protein calorie malnutrition Body mass index is 27.98 kg/m. Nutrition Status: Nutrition Problem: Moderate Malnutrition Etiology: social / environmental circumstances Signs/Symptoms: mild fat depletion, mild muscle depletion Interventions: Ensure Enlive (each supplement provides 350kcal and 20 grams of protein), MVI Seen by dietitian.  Continue supplementation.  PROGRESS NOTE    ZAKAI GONYEA  WUJ:[id number] DOB: 19-Jan-1936 DOA: 09/08/2023 PCP: [name], PA-C    Brief Narrative:   Mathew Simpson is a 87 y.o. male with past medical history of  persistent atrial fibrillation on Eliquis, HTN, type 2 diabetes mellitus, vertigo who presented to the [facility] ED on 9/17 via EMS from home after mechanical fall at home, might have fallen backwards with loss of consciousness.  Found by neighbor.  Patient was brought in by EMS and was having dizziness nausea vomiting.  Was on Eliquis as outpatient.  In the ED patient had stable vitals.  Labs showed creatinine of 1.3.  Alcohol level less than 10.  Procalcitonin less than 0.10.  CK 75.  Chest x-ray with low lung volumes.  Pelvis x-ray negative.  CT head and C-spine without contrast with 3 mm subdural hematoma anterior falx, no midline shift, 6 mm hemorrhagic contusion posterior left frontal lobe, asymmetric widening of the space between the dens and left lateral mass of the C1 relative the right lateral mass. Patient was seen by neurosurgery, follow-up CT scans demonstrate stable appearance small anterior falcine subdural and left frontal contusion does not require any acute intervention. Dr. Conchita Paris; with neurosurgery recommended  holding Eliquis for 2 weeks, no requirement of cervical immobilization and outpatient follow-up in neurosurg clinic as needed.    Assessment & Plan:   Subdural hematoma Traumatic brain injury Left frontal contusion  Scalp laceration Mechanical fall  Neurosurgery on board.  Conservative treatment at this time.  Recommendation of holding Eliquis for 2 weeks, no requirement of cervical immobilization and outpatient follow-up in neurosurg clinic as needed.  Staples removed on 09/18/2023.  CT head 09/20/23 with interval decrease in subdural hematoma. Continue to hold Eliquis x 2 weeks  MSSA Bacteremia Temperature max of 99.4 F today.  CT scan with improving subdural hematoma.  Urinalysis  negative.  Chest x-ray without any infiltrate.  Lactate was 2.7.  CT scan of the chest abdomen pelvis without any acute findings.  TTE with LVEF 55-60%, RV systolic function mildly decreased, biatrial enlargement, aortic diet lesion 45 mm, no notation of valvular vegetation although technically difficult study.  ID was consulted.  Blood cultures from 09/20/2023 with MSSA, repeat blood cultures from 09/22/2023 negative in less than 24 hours.  Procalcitonin elevated at 5.5 but trending down. MRSA PCR negative.  Currently on Cefazolin 2 g IV every 8 hours.  Continue supportive care.  Had some pain over the left knee  and x-ray done yesterday showed features of left knee replacement with moderate joint effusion.  Unable to weight-bear.  He recommends a possible arthrocentesis.  Orthopedics has been consulted.  Persistent atrial fibrillation with sinus pauses Seen by cardiology during hospitalization.  2D echocardiogram with LVEF 65 diastolic percent, normal RV function, mild biatrial enlargement, mild AI, dilated aortic root measuring 43 mm.  No indication for permanent pacemaker placement.  Cardizem dose has been decreased to 120 mg daily..  Recommended Zio patch for 2 weeks on discharge.  Essential hypertension Continue diltiazem and Irbesartan.  Blood pressure seems to be stable.  Vertigo -- Continue PT  Type 2 diabetes mellitus Latest Hemoglobin A1c 6.7.  Continue sliding scale insulin.  Moderate protein calorie malnutrition Body mass index is 27.98 kg/m. Nutrition Status: Nutrition Problem: Moderate Malnutrition Etiology: social / environmental circumstances Signs/Symptoms: mild fat depletion, mild muscle depletion Interventions: Ensure Enlive (each supplement provides 350kcal and 20 grams of protein), MVI Seen by dietitian.  Continue supplementation.

## 2023-09-24 DIAGNOSIS — I482 Chronic atrial fibrillation, unspecified: Secondary | ICD-10-CM | POA: Diagnosis not present

## 2023-09-24 DIAGNOSIS — S06309D Unspecified focal traumatic brain injury with loss of consciousness of unspecified duration, subsequent encounter: Secondary | ICD-10-CM | POA: Diagnosis not present

## 2023-09-24 DIAGNOSIS — R001 Bradycardia, unspecified: Secondary | ICD-10-CM | POA: Diagnosis not present

## 2023-09-24 DIAGNOSIS — T8450XA Infection and inflammatory reaction due to unspecified internal joint prosthesis, initial encounter: Secondary | ICD-10-CM | POA: Diagnosis not present

## 2023-09-24 DIAGNOSIS — R7881 Bacteremia: Secondary | ICD-10-CM | POA: Diagnosis not present

## 2023-09-24 DIAGNOSIS — E119 Type 2 diabetes mellitus without complications: Secondary | ICD-10-CM | POA: Diagnosis not present

## 2023-09-24 DIAGNOSIS — B9561 Methicillin susceptible Staphylococcus aureus infection as the cause of diseases classified elsewhere: Secondary | ICD-10-CM | POA: Diagnosis not present

## 2023-09-24 DIAGNOSIS — M00062 Staphylococcal arthritis, left knee: Secondary | ICD-10-CM | POA: Diagnosis not present

## 2023-09-24 LAB — GLUCOSE, CAPILLARY
Glucose-Capillary: 181 mg/dL — ABNORMAL HIGH (ref 70–99)
Glucose-Capillary: 172 mg/dL — ABNORMAL HIGH (ref 70–99)
Glucose-Capillary: 167 mg/dL — ABNORMAL HIGH (ref 70–99)
Glucose-Capillary: 218 mg/dL — ABNORMAL HIGH (ref 70–99)

## 2023-09-24 MED ORDER — ENOXAPARIN SODIUM 40 MG/0.4ML IJ SOSY
40.0000 mg | PREFILLED_SYRINGE | Freq: Every day | INTRAMUSCULAR | Status: AC
  Administered 2023-09-24 – 2023-09-27 (×4): 40 mg via SUBCUTANEOUS
  Filled 2023-09-24 (×4): qty 0.4

## 2023-09-24 NOTE — Progress Notes (Signed)
[facility]  Date of Admission:  09/08/2023      Total days of antibiotics   Cefazolin 9/29 >> c           ASSESSMENT: Mathew Simpson is a 87 y.o. male   MSSA Bacteremia, Nosocomial -  -still with some redness at right upper arm, suspect PIV related  -continue cefazolin IV  -TTE repeat looks un-concerning.  -While we did not do blood cultures on admission, there was no reason to as there was no infectious concern at that time. This appears most c/w nosocomial acquired bacteremia from PIV that has seeded his knee.  -follow repeat BCx and fever curve.  -PICC plan for next week - will need 6 week course. Unclear  -Left knee with sizeable effusion - see below    Rt Arm Phlebitis -  -Improving  -likely source initially    Left Knee Effusion, Pain -  TKR in place -  -GPC noted on stain - cloudy fluid -Ortho following and planning tentative surgery Monday 10/7  -DOAC will interfere with rifampin and prohibits use    PLAN: Continue cefazolin  Follow repeat BCx for PICC timing - will need 6 weeks IV from surgery  Plan polyliner exchange and I&D with ortho 10/7     Principal Problem:   Bleeding in head following injury with loss of consciousness (HCC) Active Problems:   Essential hypertension   Chronic atrial fibrillation (HCC)   Subdural hematoma (HCC)   DM2 (diabetes mellitus, type 2) (HCC)   Neck injury   Fall   Scalp laceration   Bradycardia   Protein-calorie malnutrition, moderate (HCC)   SDH (subdural hematoma) (HCC)   Staphylococcus aureus bacteremia   Effusion of left knee    bupivacaine(PF)  10 mL Infiltration Once   diltiazem  120 mg Oral Daily   enoxaparin (LOVENOX) injection  40 mg Subcutaneous Q1200   feeding supplement  1 Container Oral TID BM   insulin aspart  0-5 Units Subcutaneous QHS   insulin aspart  0-9 Units Subcutaneous TID WC   irbesartan  300 mg Oral Daily   melatonin  6 mg Oral QHS   metoprolol  tartrate  12.5 mg Oral BID   multivitamin with minerals  1 tablet Oral Daily   polyethylene glycol  17 g Oral BID   QUEtiapine  12.5 mg Oral QHS    SUBJECTIVE: Feels a little confused today    Review of Systems: Review of Systems  Constitutional:  Negative for chills and fever.  Gastrointestinal:  Negative for abdominal pain, nausea and vomiting.  Psychiatric/Behavioral:  Negative for suicidal ideas.      Allergies  Allergen Reactions   Penicillins Anaphylaxis    Tolerates ancef  Did it involve swelling of the face/tongue/throat, SOB, or low BP? Yes Did it involve sudden or severe rash/hives, skin peeling, or any reaction on the inside of your mouth or nose? Unknown Did you need to seek medical attention at a hospital or doctor's office? Yes When did it last happen?      50+ years If all above answers are "NO", may proceed with cephalosporin use.     OBJECTIVE: Vitals:   09/23/23 2335 09/24/23 0417 09/24/23 0844 09/24/23 1116  BP: (!) 151/81 (!) 159/86 (!) 156/92 (!) 153/72  Pulse: 89 90 97 72  Resp: 20 20 18 19   Temp: 98.1 F (36.7 C) 98.3 F (36.8 C) 99.2 F (37.3 C)  98.5 F (36.9 C)  TempSrc: Axillary Oral Oral Axillary  SpO2: 98% 96% 99% 99%  Weight:      Height:       Body mass index is 27.98 kg/m.   Physical Exam Constitutional:      Appearance: Normal appearance.  Cardiovascular:     Rate and Rhythm: Normal rate.     Heart sounds: No murmur heard. Pulmonary:     Effort: No respiratory distress.     Breath sounds: Normal breath sounds.  Abdominal:     General: Bowel sounds are normal. There is no distension.  Musculoskeletal:     Left knee: Swelling and effusion present. Decreased range of motion. Tenderness present.  Skin:    General: Skin is warm and dry.     Capillary Refill: Capillary refill takes less than 2 seconds.     Findings: No rash.  Neurological:     Mental Status: He is alert and oriented to person, place, and time.       Lab Results Lab Results  Component Value Date   WBC 6.9 09/23/2023   HGB 11.6 (L) 09/23/2023   HCT 35.5 (L) 09/23/2023   MCV 90.3 09/23/2023   PLT 98 (L) 09/23/2023    Lab Results  Component Value Date   CREATININE 1.05 09/23/2023   BUN 27 (H) 09/23/2023   NA 141 09/23/2023   K 3.2 (L) 09/23/2023   CL 113 (H) 09/23/2023   CO2 19 (L) 09/23/2023    Lab Results  Component Value Date   ALT 52 (H) 09/22/2023   AST 53 (H) 09/22/2023   ALKPHOS 70 09/22/2023   BILITOT 0.9 09/22/2023     Microbiology: Recent Results (from the past 240 hour(s))  Urine Culture (for pregnant, neutropenic or urologic patients or patients with an indwelling urinary catheter)     Status: Abnormal   Collection Time: 09/20/23  9:47 PM   Specimen: Urine, Clean Catch  Result Value Ref Range Status   Specimen Description URINE, CLEAN CATCH  Final   Special Requests   Final    NONE Performed at [facility], [address], [place], [place] [address]    Culture 60,000 COLONIES/mL STAPHYLOCOCCUS AUREUS (A)  Final   Report Status 09/23/2023 FINAL  Final   Organism ID, Bacteria STAPHYLOCOCCUS AUREUS (A)  Final      Susceptibility   Staphylococcus aureus - MIC*    CIPROFLOXACIN <=0.5 SENSITIVE Sensitive     GENTAMICIN <=0.5 SENSITIVE Sensitive     NITROFURANTOIN <=16 SENSITIVE Sensitive     OXACILLIN <=0.25 SENSITIVE Sensitive     TETRACYCLINE <=1 SENSITIVE Sensitive     VANCOMYCIN <=0.5 SENSITIVE Sensitive     TRIMETH/SULFA <=10 SENSITIVE Sensitive     CLINDAMYCIN <=0.25 SENSITIVE Sensitive     RIFAMPIN <=0.5 SENSITIVE Sensitive     Inducible Clindamycin NEGATIVE Sensitive     LINEZOLID 2 SENSITIVE Sensitive     * 60,000 COLONIES/mL STAPHYLOCOCCUS AUREUS  Culture, blood (Routine X 2) w Reflex to ID Panel     Status: Abnormal   Collection Time: 09/20/23 10:54 PM   Specimen: BLOOD RIGHT ARM  Result Value Ref Range Status   Specimen Description BLOOD RIGHT ARM  Final   Special Requests    Final    BOTTLES DRAWN AEROBIC AND ANAEROBIC Blood Culture adequate volume   Culture  Setup Time   Final    GRAM POSITIVE COCCI IN CLUSTERS IN BOTH AEROBIC AND ANAEROBIC BOTTLES CRITICAL VALUE NOTED.  VALUE IS CONSISTENT WITH PREVIOUSLY REPORTED AND CALLED VALUE.    Culture (A)  Final    STAPHYLOCOCCUS AUREUS SUSCEPTIBILITIES PERFORMED ON PREVIOUS CULTURE WITHIN THE LAST 5 DAYS. Performed at [facility], [address] [place], [place] [address]    Report Status 09/23/2023 FINAL  Final  Culture, blood (Routine X 2) w Reflex to ID Panel     Status: Abnormal   Collection Time: 09/20/23 10:56 PM   Specimen: BLOOD RIGHT HAND  Result Value Ref Range Status   Specimen Description BLOOD RIGHT HAND  Final   Special Requests   Final    BOTTLES DRAWN AEROBIC AND ANAEROBIC Blood Culture adequate volume   Culture  Setup Time   Final    GRAM POSITIVE COCCI IN CLUSTERS IN BOTH AEROBIC AND ANAEROBIC BOTTLES CRITICAL RESULT CALLED TO, READ BACK BY AND VERIFIED WITHSalina April PHARMD, AT 1144 09/21/23 Performed at [facility], [address] [place], [place] [address]    Culture STAPHYLOCOCCUS AUREUS (A)  Final   Report Status 09/23/2023 FINAL  Final   Organism ID, Bacteria STAPHYLOCOCCUS AUREUS  Final      Susceptibility   Staphylococcus aureus - MIC*    CIPROFLOXACIN <=0.5 SENSITIVE Sensitive     ERYTHROMYCIN <=0.25 SENSITIVE Sensitive     GENTAMICIN <=0.5 SENSITIVE Sensitive     OXACILLIN 0.5 SENSITIVE Sensitive     TETRACYCLINE <=1 SENSITIVE Sensitive     VANCOMYCIN <=0.5 SENSITIVE Sensitive     TRIMETH/SULFA <=10 SENSITIVE Sensitive     CLINDAMYCIN <=0.25 SENSITIVE Sensitive     RIFAMPIN <=0.5 SENSITIVE Sensitive     Inducible Clindamycin NEGATIVE Sensitive     LINEZOLID 2 SENSITIVE Sensitive     * STAPHYLOCOCCUS AUREUS  Blood Culture ID Panel (Reflexed)     Status: Abnormal   Collection Time: 09/20/23 10:56 PM  Result Value Ref Range Status   Enterococcus faecalis  NOT DETECTED NOT DETECTED Final   Enterococcus Faecium NOT DETECTED NOT DETECTED Final   Listeria monocytogenes NOT DETECTED NOT DETECTED Final   Staphylococcus species DETECTED (A) NOT DETECTED Final    Comment: CRITICAL RESULT CALLED TO, READ BACK BY AND VERIFIED WITH: A. UTOMWEN PHARMD, AT 1144 09/21/23    Staphylococcus aureus (BCID) DETECTED (A) NOT DETECTED Final    Comment: CRITICAL RESULT CALLED TO, READ BACK BY AND VERIFIED WITH: A. UTOMWEN PHARMD, AT 1144 09/21/23    Staphylococcus epidermidis NOT DETECTED NOT DETECTED Final   Staphylococcus lugdunensis NOT DETECTED NOT DETECTED Final   Streptococcus species NOT DETECTED NOT DETECTED Final   Streptococcus agalactiae NOT DETECTED NOT DETECTED Final   Streptococcus pneumoniae NOT DETECTED NOT DETECTED Final   Streptococcus pyogenes NOT DETECTED NOT DETECTED Final   A.calcoaceticus-baumannii NOT DETECTED NOT DETECTED Final   Bacteroides fragilis NOT DETECTED NOT DETECTED Final   Enterobacterales NOT DETECTED NOT DETECTED Final   Enterobacter cloacae complex NOT DETECTED NOT DETECTED Final   Escherichia coli NOT DETECTED NOT DETECTED Final   Klebsiella aerogenes NOT DETECTED NOT DETECTED Final   Klebsiella oxytoca NOT DETECTED NOT DETECTED Final   Klebsiella pneumoniae NOT DETECTED NOT DETECTED Final   Proteus species NOT DETECTED NOT DETECTED Final   Salmonella species NOT DETECTED NOT DETECTED Final   Serratia marcescens NOT DETECTED NOT DETECTED Final   Haemophilus influenzae NOT DETECTED NOT DETECTED Final   Neisseria meningitidis NOT DETECTED NOT DETECTED Final   Pseudomonas aeruginosa NOT DETECTED NOT DETECTED Final   Stenotrophomonas maltophilia NOT DETECTED  NOT DETECTED Final   Candida albicans NOT DETECTED NOT DETECTED Final   Candida auris NOT DETECTED NOT DETECTED Final   Candida glabrata NOT DETECTED NOT DETECTED Final   Candida krusei NOT DETECTED NOT DETECTED Final   Candida parapsilosis NOT DETECTED NOT DETECTED  Final   Candida tropicalis NOT DETECTED NOT DETECTED Final   Cryptococcus neoformans/gattii NOT DETECTED NOT DETECTED Final   Meth resistant mecA/C and MREJ NOT DETECTED NOT DETECTED Final    Comment: Performed at [facility], [address] [place], [place] [address]  MRSA Next Gen by PCR, Nasal     Status: None   Collection Time: 09/21/23  7:12 AM   Specimen: Nasal Mucosa; Nasal Swab  Result Value Ref Range Status   MRSA by PCR Next Gen NOT DETECTED NOT DETECTED Final    Comment: (NOTE) The GeneXpert MRSA Assay (FDA approved for NASAL specimens only), is one component of a comprehensive MRSA colonization surveillance program. It is not intended to diagnose MRSA infection nor to guide or monitor treatment for MRSA infections. Test performance is not FDA approved in patients less than 29 years old. Performed at [facility], [address] [place], [place] [address]   Culture, blood (Routine X 2) w Reflex to ID Panel     Status: None (Preliminary result)   Collection Time: 09/22/23  6:20 AM   Specimen: BLOOD RIGHT HAND  Result Value Ref Range Status   Specimen Description BLOOD RIGHT HAND  Final   Special Requests   Final    BOTTLES DRAWN AEROBIC AND ANAEROBIC Blood Culture results may not be optimal due to an excessive volume of blood received in culture bottles   Culture   Final    NO GROWTH 2 DAYS Performed at [facility], [address], [place], [place] [address]    Report Status PENDING  Incomplete  Culture, blood (Routine X 2) w Reflex to ID Panel     Status: None (Preliminary result)   Collection Time: 09/22/23  6:25 AM   Specimen: BLOOD LEFT HAND  Result Value Ref Range Status   Specimen Description BLOOD LEFT HAND  Final   Special Requests   Final    BOTTLES DRAWN AEROBIC AND ANAEROBIC Blood Culture results may not be optimal due to an excessive volume of blood received in culture bottles   Culture   Final    NO GROWTH 2 DAYS Performed at [facility], [address] [place], [place] [address]    Report Status PENDING  Incomplete  Body fluid culture w Gram Stain     Status: None (Preliminary result)   Collection Time: 09/23/23  2:20 PM   Specimen: Synovium; Body Fluid  Result Value Ref Range Status   Specimen Description SYNOVIAL LEFT KNEE  Final   Special Requests NONE  Final   Gram Stain   Final    ABUNDANT WBC PRESENT, PREDOMINANTLY PMN FEW GRAM POSITIVE COCCI IN PAIRS CRITICAL RESULT CALLED TO, READ BACK BY AND VERIFIED WITH: RN ALEXIS MORGAN ON 09/23/23 @ 1909 BY DRT Performed at [facility], [address] [place], [place] [address]    Culture PENDING  Incomplete   Report Status PENDING  Incomplete   Rexene Alberts, MSN, NP-C [facility]  [email or web address] Pager: [phone] Office: [phone] [facility] Main Line: [phone] *Secure Chat Communication Welcome

## 2023-09-24 NOTE — Progress Notes (Addendum)
   Subjective:    Patient seen in rounds for Dr. Lequita Halt. Patient is well, and has had no acute complaints or problems. Continues to endorse left knee pain.  Objective: Vital signs in last 24 hours: Temp:  [98.1 F (36.7 C)-98.9 F (37.2 C)] 98.3 F (36.8 C) (10/03 0417) Pulse Rate:  [85-90] 90 (10/03 0417) Resp:  [17-20] 20 (10/03 0417) BP: (141-159)/(79-87) 159/86 (10/03 0417) SpO2:  [96 %-99 %] 96 % (10/03 0417)  Intake/Output from previous day:  Intake/Output Summary (Last 24 hours) at 09/24/2023 0741 Last data filed at 09/24/2023 0600 Gross per 24 hour  Intake 240 ml  Output 1025 ml  Net -785 ml     Intake/Output this shift: No intake/output data recorded.  Labs: Recent Labs    09/22/23 0511 09/23/23 0503  HGB 12.7* 11.6*   Recent Labs    09/22/23 0511 09/23/23 0503  WBC 6.9 6.9  RBC 4.11* 3.93*  HCT 37.9* 35.5*  PLT 101* 98*   Recent Labs    09/22/23 0511 09/23/23 0503  NA 147* 141  K 3.6 3.2*  CL 118* 113*  CO2 19* 19*  BUN 31* 27*  CREATININE 1.33* 1.05  GLUCOSE 127* 163*  CALCIUM 8.0* 7.9*   No results for input(s): "LABPT", "INR" in the last 72 hours.  Exam: General - Patient is Alert and Confused Extremity - Neurologically intact Neurovascular intact Sensation intact distally Dorsiflexion/Plantar flexion intact  Left knee shows mild effusion. Minimal erythema. Diffuse tenderness to palpation.  Past Medical History:  Diagnosis Date   Arthritis    back,   DJD (degenerative joint disease)    Dysrhythmia    A-fib   Glaucoma    Hypertension     Assessment/Plan:    Principal Problem:   Bleeding in head following injury with loss of consciousness (HCC) Active Problems:   Essential hypertension   Chronic atrial fibrillation (HCC)   Subdural hematoma (HCC)   DM2 (diabetes mellitus, type 2) (HCC)   Neck injury   Fall   Scalp laceration   Bradycardia   Protein-calorie malnutrition, moderate (HCC)   SDH (subdural hematoma)  (HCC)   Staphylococcus aureus bacteremia   Effusion of left knee  Estimated body mass index is 27.98 kg/m as calculated from the following:   Height as of this encounter: 5\' 10"  (1.778 m).   Weight as of this encounter: 88.5 kg.  Cultures from yesterday showing bacterium in the left knee. Patient will require I&D with poly swap. Attempted to call granddaughter to discuss as she is main point of contact for patient especially given current state. Tentatively planning for this today vs Monday.   ADDENDUM: I spoke with granddaughter. Given confusion and that he is not currently septic, it would be better to do surgery electively on Monday 10/07.  R. Arcola Jansky, PA-C Orthopedic Surgery 09/24/2023, 7:41 AM

## 2023-09-24 NOTE — Progress Notes (Signed)
PROGRESS NOTE    Mathew Simpson  MWU:[id number] DOB: 06/06/1936 DOA: 09/08/2023 PCP: [name], PA-C    Brief Narrative:   Mathew Simpson is a 87 y.o. male with past medical history of  persistent atrial fibrillation on Eliquis, HTN, type 2 diabetes mellitus, vertigo who presented to the [facility] ED on 9/17 via EMS from home after mechanical fall at home, might have fallen backwards with loss of consciousness.  Found by neighbor.  Patient was brought in by EMS and was having dizziness nausea vomiting.  Was on Eliquis as outpatient.  In the ED patient had stable vitals.  Labs showed creatinine of 1.3.  Alcohol level less than 10.  Procalcitonin less than 0.10.  CK 75.  Chest x-ray with low lung volumes.  Pelvis x-ray negative.  CT head and C-spine without contrast with 3 mm subdural hematoma anterior falx, no midline shift, 6 mm hemorrhagic contusion posterior left frontal lobe, asymmetric widening of the space between the dens and left lateral mass of the C1 relative the right lateral mass. Patient was seen by neurosurgery, follow-up CT scans demonstrate stable appearance small anterior falcine subdural and left frontal contusion does not require any acute intervention. Dr. Conchita Paris; with neurosurgery recommended  holding Eliquis for 2 weeks, no requirement of cervical immobilization and outpatient follow-up in neurosurg clinic as needed.    Assessment & Plan:   Subdural hematoma Traumatic brain injury Left frontal contusion  Scalp laceration Mechanical fall  Neurosurgery followed the patient during hospitalization.  Conservative treatment at this time.  Recommendation of holding Eliquis for 2 weeks, no requirement of cervical immobilization and outpatient follow-up in neurosurg clinic as needed.  Staples removed on 09/18/2023.  CT head 09/20/23 with interval decrease in subdural hematoma. Continue to hold Eliquis x 2 weeks  Left knee pain, effusion likely septic arthritis.  Status post  arthrocentesis by orthopedics yesterday with cultures showing gram-positive positive cocci in clusters.  Culture and sensitivity pending.  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  Will likely need I&D with poly swap as per orthopedics.  MSSA Bacteremia Temperature max of 99.2 F today.  CT scan with improving subdural hematoma.  Urinalysis negative.  Chest x-ray without any infiltrate.  Lactate was 2.7.  CT scan of the chest abdomen pelvis without any acute findings.  TTE with LVEF 55-60%, RV systolic function mildly decreased, biatrial enlargement,  no notation of valvular vegetation although technically difficult study.  ID was consulted.  Blood cultures from 09/20/2023 with MSSA, repeat blood cultures from 09/22/2023 negative in less than 24 hours.  Procalcitonin elevated at 5.5 but trending down. MRSA PCR negative.  Left knee aspirations with gram-positive cocci in pairs.  Currently on Cefazolin 2 g IV every 8 hours.  Continue supportive care.    Persistent atrial fibrillation with sinus pauses Seen by cardiology during hospitalization.  2D echocardiogram with LVEF 65 percent, normal RV function, mild biatrial enlargement, mild AI, dilated aortic root measuring 43 mm.  No indication for permanent pacemaker placement.  Cardizem dose has been decreased to 120 mg daily..  Recommended Zio patch for 2 weeks on discharge.  Essential hypertension Continue diltiazem and Irbesartan.  Blood pressure seems to be stable.  Vertigo -- Continue PT  Type 2 diabetes mellitus Latest Hemoglobin A1c 6.7.  Continue sliding scale insulin.  Glycemic control adequate.  Moderate protein calorie malnutrition Body mass index is 27.98 kg/m. Etiology: social / environmental circumstances Signs/Symptoms: mild fat depletion, mild muscle depletion Interventions:  Ensure Enlive (each supplement provides 350kcal and 20 grams of protein), MVI Seen by dietitian.  Continue nutritional  supplementation.  Hospital-acquired delirium Delirium precautions, Continue melatonin and Seroquel  Weakness/debility/deconditioning/gait disturbance: PT/OT initially recommendied SNF placement, denied by insurance carrier following peer to peer. --TOC following, family appeal for insurance denial now approved, has authorization through 10/5 but patient has left knee septic arthritis.  DVT prophylaxis: SCDs Start: 09/08/23 2101    Code Status: Full Code  Family Communication:  Spoke with the patient's daughter Ms. [name] on the phone and updated her about the clinical condition of the patient. .  Disposition Plan:  Level of care: Telemetry Medical Status is: Inpatient  Remains inpatient appropriate because: MSSA bacteremia, left knee joint effusion with infection possible need for joint wash, pending SNF placement   Consultants:  Neurosurgery, Dr. Conchita Paris Cardiology Infectious disease Orthopedics  Procedures:  TTE Arthrocentesis 10-24  Antimicrobials:  Vancomycin 9/29 - 9/29 Metronidazole 9/29 - 9/30 Aztreonam 9/29 - 9/30 Cefazolin 9/30>>   Subjective: Today, patient was seen and examined at bedside.  Poor historian.  Complains of left knee pain and difficulty moving with effusion.  Appears somnolent today.  No nausea vomiting fever chills or rigor.    Objective: Vitals:   09/23/23 2024 09/23/23 2335 09/24/23 0417 09/24/23 0844  BP: (!) 145/79 (!) 151/81 (!) 159/86 (!) 156/92  Pulse: 86 89 90 97  Resp: 20 20 20 18   Temp: 98.6 F (37 C) 98.1 F (36.7 C) 98.3 F (36.8 C) 99.2 F (37.3 C)  TempSrc: Oral Axillary Oral Oral  SpO2: 97% 98% 96% 99%  Weight:      Height:        Intake/Output Summary (Last 24 hours) at 09/24/2023 1050 Last data filed at 09/24/2023 0600 Gross per 24 hour  Intake 240 ml  Output 1025 ml  Net -785 ml   Filed Weights   09/08/23 1601  Weight: 88.5 kg    Physical examination:  General:  Average built, not in obvious  distress, appears chronically ill, older than stated age, mildly somnolent today. HENT:   No scleral pallor or icterus noted. Oral mucosa is moist.  Scalp laceration. Chest:   Diminished breath sounds bilaterally. No crackles or wheezes.  CVS: S1 &S2 heard. No murmur.  Regular rate and rhythm. Abdomen: Soft, nontender, nondistended.  Bowel sounds are heard.   Extremities: No cyanosis, clubbing or edema.  Peripheral pulses are palpable.  Left knee pain with tenderness with effusion.   Psych: Alert, awake and oriented to place, somnolent CNS:  No cranial nerve deficits.  Moves extremities Skin: Warm and dry.  Scalp laceration.  Data Reviewed: I have personally reviewed the following labs and imaging studies.     CBC: Recent Labs  Lab 09/20/23 1202 09/20/23 2255 09/22/23 0511 09/23/23 0503  WBC 6.5 6.4 6.9 6.9  HGB 13.4 13.9 12.7* 11.6*  HCT 40.6 41.3 37.9* 35.5*  MCV 91.4 90.2 92.2 90.3  PLT 136* 138* 101* 98*   Basic Metabolic Panel: Recent Labs  Lab 09/20/23 1202 09/22/23 0511 09/23/23 0503  NA 142 147* 141  K 3.4* 3.6 3.2*  CL 110 118* 113*  CO2 22 19* 19*  GLUCOSE 215* 127* 163*  BUN 31* 31* 27*  CREATININE 1.28* 1.33* 1.05  CALCIUM 8.2* 8.0* 7.9*  MG 1.9 2.2  --   PHOS  --  2.6  --    GFR: Estimated Creatinine Clearance: 57.6 mL/min (by C-G formula based on SCr of 1.05  mg/dL). Liver Function Tests: Recent Labs  Lab 09/22/23 0511  AST 53*  ALT 52*  ALKPHOS 70  BILITOT 0.9  PROT 5.6*  ALBUMIN 2.3*   No results for input(s): "LIPASE", "AMYLASE" in the last 168 hours. No results for input(s): "AMMONIA" in the last 168 hours. Coagulation Profile: No results for input(s): "INR", "PROTIME" in the last 168 hours. Cardiac Enzymes: No results for input(s): "CKTOTAL", "CKMB", "CKMBINDEX", "TROPONINI" in the last 168 hours. BNP (last 3 results) No results for input(s): "PROBNP" in the last 8760 hours. HbA1C: No results for input(s): "HGBA1C" in the last 72  hours. CBG: Recent Labs  Lab 09/23/23 0606 09/23/23 1218 09/23/23 1635 09/23/23 2123 09/24/23 0606  GLUCAP 146* 167* 155* 225* 181*   Lipid Profile: No results for input(s): "CHOL", "HDL", "LDLCALC", "TRIG", "CHOLHDL", "LDLDIRECT" in the last 72 hours. Thyroid Function Tests: No results for input(s): "TSH", "T4TOTAL", "FREET4", "T3FREE", "THYROIDAB" in the last 72 hours.  Anemia Panel: No results for input(s): "VITAMINB12", "FOLATE", "FERRITIN", "TIBC", "IRON", "RETICCTPCT" in the last 72 hours. Sepsis Labs: Recent Labs  Lab 09/20/23 2255 09/21/23 0241 09/22/23 0511 09/22/23 0620 09/23/23 0503  PROCALCITON 8.44  --   --  8.09 5.05  LATICACIDVEN 2.7* 1.5 1.4  --   --     Recent Results (from the past 240 hour(s))  Urine Culture (for pregnant, neutropenic or urologic patients or patients with an indwelling urinary catheter)     Status: Abnormal   Collection Time: 09/20/23  9:47 PM   Specimen: Urine, Clean Catch  Result Value Ref Range Status   Specimen Description URINE, CLEAN CATCH  Final   Special Requests   Final    NONE Performed at [facility], [address] [place], [place] [address]    Culture 60,000 COLONIES/mL STAPHYLOCOCCUS AUREUS (A)  Final   Report Status 09/23/2023 FINAL  Final   Organism ID, Bacteria STAPHYLOCOCCUS AUREUS (A)  Final      Susceptibility   Staphylococcus aureus - MIC*    CIPROFLOXACIN <=0.5 SENSITIVE Sensitive     GENTAMICIN <=0.5 SENSITIVE Sensitive     NITROFURANTOIN <=16 SENSITIVE Sensitive     OXACILLIN <=0.25 SENSITIVE Sensitive     TETRACYCLINE <=1 SENSITIVE Sensitive     VANCOMYCIN <=0.5 SENSITIVE Sensitive     TRIMETH/SULFA <=10 SENSITIVE Sensitive     CLINDAMYCIN <=0.25 SENSITIVE Sensitive     RIFAMPIN <=0.5 SENSITIVE Sensitive     Inducible Clindamycin NEGATIVE Sensitive     LINEZOLID 2 SENSITIVE Sensitive     * 60,000 COLONIES/mL STAPHYLOCOCCUS AUREUS  Culture, blood (Routine X 2) w Reflex to ID Panel     Status:  Abnormal   Collection Time: 09/20/23 10:54 PM   Specimen: BLOOD RIGHT ARM  Result Value Ref Range Status   Specimen Description BLOOD RIGHT ARM  Final   Special Requests   Final    BOTTLES DRAWN AEROBIC AND ANAEROBIC Blood Culture adequate volume   Culture  Setup Time   Final    GRAM POSITIVE COCCI IN CLUSTERS IN BOTH AEROBIC AND ANAEROBIC BOTTLES CRITICAL VALUE NOTED.  VALUE IS CONSISTENT WITH PREVIOUSLY REPORTED AND CALLED VALUE.    Culture (A)  Final    STAPHYLOCOCCUS AUREUS SUSCEPTIBILITIES PERFORMED ON PREVIOUS CULTURE WITHIN THE LAST 5 DAYS. Performed at [facility], [address], [place], [place] [address]    Report Status 09/23/2023 FINAL  Final  Culture, blood (Routine X 2) w Reflex to ID Panel  Status: Abnormal   Collection Time: 09/20/23 10:56 PM   Specimen: BLOOD RIGHT HAND  Result Value Ref Range Status   Specimen Description BLOOD RIGHT HAND  Final   Special Requests   Final    BOTTLES DRAWN AEROBIC AND ANAEROBIC Blood Culture adequate volume   Culture  Setup Time   Final    GRAM POSITIVE COCCI IN CLUSTERS IN BOTH AEROBIC AND ANAEROBIC BOTTLES CRITICAL RESULT CALLED TO, READ BACK BY AND VERIFIED WITH: Salina April PHARMD, AT 1144 09/21/23 Performed at [facility], [address], [place], [place] [address]    Culture STAPHYLOCOCCUS AUREUS (A)  Final   Report Status 09/23/2023 FINAL  Final   Organism ID, Bacteria STAPHYLOCOCCUS AUREUS  Final      Susceptibility   Staphylococcus aureus - MIC*    CIPROFLOXACIN <=0.5 SENSITIVE Sensitive     ERYTHROMYCIN <=0.25 SENSITIVE Sensitive     GENTAMICIN <=0.5 SENSITIVE Sensitive     OXACILLIN 0.5 SENSITIVE Sensitive     TETRACYCLINE <=1 SENSITIVE Sensitive     VANCOMYCIN <=0.5 SENSITIVE Sensitive     TRIMETH/SULFA <=10 SENSITIVE Sensitive     CLINDAMYCIN <=0.25 SENSITIVE Sensitive     RIFAMPIN <=0.5 SENSITIVE Sensitive     Inducible Clindamycin NEGATIVE Sensitive     LINEZOLID 2 SENSITIVE Sensitive      * STAPHYLOCOCCUS AUREUS  Blood Culture ID Panel (Reflexed)     Status: Abnormal   Collection Time: 09/20/23 10:56 PM  Result Value Ref Range Status   Enterococcus faecalis NOT DETECTED NOT DETECTED Final   Enterococcus Faecium NOT DETECTED NOT DETECTED Final   Listeria monocytogenes NOT DETECTED NOT DETECTED Final   Staphylococcus species DETECTED (A) NOT DETECTED Final    Comment: CRITICAL RESULT CALLED TO, READ BACK BY AND VERIFIED WITH: A. UTOMWEN PHARMD, AT 1144 09/21/23    Staphylococcus aureus (BCID) DETECTED (A) NOT DETECTED Final    Comment: CRITICAL RESULT CALLED TO, READ BACK BY AND VERIFIED WITH: A. UTOMWEN PHARMD, AT 1144 09/21/23    Staphylococcus epidermidis NOT DETECTED NOT DETECTED Final   Staphylococcus lugdunensis NOT DETECTED NOT DETECTED Final   Streptococcus species NOT DETECTED NOT DETECTED Final   Streptococcus agalactiae NOT DETECTED NOT DETECTED Final   Streptococcus pneumoniae NOT DETECTED NOT DETECTED Final   Streptococcus pyogenes NOT DETECTED NOT DETECTED Final   A.calcoaceticus-baumannii NOT DETECTED NOT DETECTED Final   Bacteroides fragilis NOT DETECTED NOT DETECTED Final   Enterobacterales NOT DETECTED NOT DETECTED Final   Enterobacter cloacae complex NOT DETECTED NOT DETECTED Final   Escherichia coli NOT DETECTED NOT DETECTED Final   Klebsiella aerogenes NOT DETECTED NOT DETECTED Final   Klebsiella oxytoca NOT DETECTED NOT DETECTED Final   Klebsiella pneumoniae NOT DETECTED NOT DETECTED Final   Proteus species NOT DETECTED NOT DETECTED Final   Salmonella species NOT DETECTED NOT DETECTED Final   Serratia marcescens NOT DETECTED NOT DETECTED Final   Haemophilus influenzae NOT DETECTED NOT DETECTED Final   Neisseria meningitidis NOT DETECTED NOT DETECTED Final   Pseudomonas aeruginosa NOT DETECTED NOT DETECTED Final   Stenotrophomonas maltophilia NOT DETECTED NOT DETECTED Final   Candida albicans NOT DETECTED NOT DETECTED Final   Candida auris NOT  DETECTED NOT DETECTED Final   Candida glabrata NOT DETECTED NOT DETECTED Final   Candida krusei NOT DETECTED NOT DETECTED Final   Candida parapsilosis NOT DETECTED NOT DETECTED Final   Candida tropicalis NOT DETECTED NOT DETECTED Final   Cryptococcus neoformans/gattii NOT DETECTED NOT DETECTED Final  Meth resistant mecA/C and MREJ NOT DETECTED NOT DETECTED Final    Comment: Performed at [facility], [address] [place], [place] [address]  MRSA Next Gen by PCR, Nasal     Status: None   Collection Time: 09/21/23  7:12 AM   Specimen: Nasal Mucosa; Nasal Swab  Result Value Ref Range Status   MRSA by PCR Next Gen NOT DETECTED NOT DETECTED Final    Comment: (NOTE) The GeneXpert MRSA Assay (FDA approved for NASAL specimens only), is one component of a comprehensive MRSA colonization surveillance program. It is not intended to diagnose MRSA infection nor to guide or monitor treatment for MRSA infections. Test performance is not FDA approved in patients less than 33 years old. Performed at [facility], [address] [place], [place] [address]   Culture, blood (Routine X 2) w Reflex to ID Panel     Status: None (Preliminary result)   Collection Time: 09/22/23  6:20 AM   Specimen: BLOOD RIGHT HAND  Result Value Ref Range Status   Specimen Description BLOOD RIGHT HAND  Final   Special Requests   Final    BOTTLES DRAWN AEROBIC AND ANAEROBIC Blood Culture results may not be optimal due to an excessive volume of blood received in culture bottles   Culture   Final    NO GROWTH < 24 HOURS Performed at [facility], [address] [place], [place] [address]    Report Status PENDING  Incomplete  Culture, blood (Routine X 2) w Reflex to ID Panel     Status: None (Preliminary result)   Collection Time: 09/22/23  6:25 AM   Specimen: BLOOD LEFT HAND  Result Value Ref Range Status   Specimen Description BLOOD LEFT HAND  Final   Special Requests   Final    BOTTLES DRAWN  AEROBIC AND ANAEROBIC Blood Culture results may not be optimal due to an excessive volume of blood received in culture bottles   Culture   Final    NO GROWTH < 24 HOURS Performed at [facility], [address] [place], [place] [address]    Report Status PENDING  Incomplete  Body fluid culture w Gram Stain     Status: None (Preliminary result)   Collection Time: 09/23/23  2:20 PM   Specimen: Synovium; Body Fluid  Result Value Ref Range Status   Specimen Description SYNOVIAL LEFT KNEE  Final   Special Requests NONE  Final   Gram Stain   Final    ABUNDANT WBC PRESENT, PREDOMINANTLY PMN FEW GRAM POSITIVE COCCI IN PAIRS CRITICAL RESULT CALLED TO, READ BACK BY AND VERIFIED WITH: RN ALEXIS MORGAN ON 09/23/23 @ 1909 BY DRT Performed at [facility], [address], [place], [place] [address]    Culture PENDING  Incomplete   Report Status PENDING  Incomplete      Radiology Studies: DG Knee 3 Views Left  Result Date: 09/22/2023 CLINICAL DATA:  Left knee pain EXAM: LEFT KNEE - 3 VIEW COMPARISON:  None Available. FINDINGS: Prior left knee replacement. No hardware complicating feature. Moderate joint effusion. No acute bony abnormality. Specifically, no fracture, subluxation, or dislocation. IMPRESSION: Left knee replacement. Moderate joint effusion. No acute bony abnormality. Electronically Signed   By: Charlett Nose M.D.   On: 09/22/2023 18:07     Scheduled Meds:  bupivacaine(PF)  10 mL Infiltration Once   diltiazem  120 mg Oral Daily   feeding supplement  1 Container  Oral TID BM   insulin aspart  0-5 Units Subcutaneous QHS   insulin aspart  0-9 Units Subcutaneous TID WC   irbesartan  300 mg Oral Daily   melatonin  6 mg Oral QHS   metoprolol tartrate  12.5 mg Oral BID   multivitamin with minerals  1 tablet Oral Daily   polyethylene glycol  17 g Oral BID   QUEtiapine  12.5 mg Oral QHS   Continuous Infusions:   ceFAZolin (ANCEF) IV 2 g (09/24/23 0520)      LOS: 7 days     Joycelyn Das, MD [facility] Available via Epic secure chat 7am-7pm After these hours, please refer to coverage provider listed on [email or web address] 09/24/2023, 10:50 AM

## 2023-09-24 NOTE — Progress Notes (Signed)
Physical Therapy Treatment Patient Details Name: Mathew Simpson MRN: [id number] DOB: 04-May-1936 Today's Date: 09/24/2023   History of Present Illness Mathew Simpson is a 87 y.o. male admitted 9/17 to the hospital after presenting to the emergency department after a fall.  CT scan was therefore obtained which demonstrated a small intracranial hemorrhage in the left frontal/parietal region. C-spine CT was worrisome for instability but pt seen by neurosurgery and repeat CT demonstrated stable on no requirement for cervical immobilization. Pt with worsening confusion but CT on 9/29 demonstrated decreased size in SDH.  Pt did have fever 9/29 - ongoing workup, noted possible need for TEE.  PMH for hypertension, glaucoma, hx of B TKR.    PT Comments  Patient lethargic initially with increased alertness with participation. Patient needs maximal encouragement to participate with mobility efforts. He reports L knee pain has improved but still expressed pain with knee extension. Mod A required for bed mobility with fair sitting balance. No dizziness is reported with upright activity. The patient refused to stand despite encouragement. Max A +2 person for one lateral scoot to the right with no weightbearing on the left leg.  Noted patient may require I&D with poly swap of L knee. PT will continue to follow to maximize independence and decrease caregiver burden. Anticipate patient will require rehabilitation after this hospital stay < 3 hours/day.    If plan is discharge home, recommend the following: Two people to help with walking and/or transfers;Two people to help with bathing/dressing/bathroom;Assist for transportation;Help with stairs or ramp for entrance;Assistance with cooking/housework   Can travel by private vehicle     No  Equipment Recommendations   (to be determined at next level of care)    Recommendations for Other Services       Precautions / Restrictions Precautions Precautions:  Fall Restrictions Weight Bearing Restrictions: No     Mobility  Bed Mobility Overal bed mobility: Needs Assistance Bed Mobility: Supine to Sit, Sit to Supine     Supine to sit: Mod assist Sit to supine: Mod assist   General bed mobility comments: assistance for trunk and LLE support. verbal cues for task initiation and sequencing. increased time and effort required as well as encouragement to participate    Transfers Overall transfer level: Needs assistance                Lateral/Scoot Transfers: Max assist, +2 physical assistance General transfer comment: patient reports less knee pain today with sitting up but refused to stand despite offering multiple times and encouraging mobility. assistance for lateral scoot transfer to the right with no weightbeing attempted on the left leg    Ambulation/Gait                   Stairs             Wheelchair Mobility     Tilt Bed    Modified Rankin (Stroke Patients Only)       Balance Overall balance assessment: Needs assistance Sitting-balance support: Bilateral upper extremity supported (R foot supported) Sitting balance-Leahy Scale: Fair Sitting balance - Comments: stand by assistance for safety. no dizziness is reported with activity                                    Cognition Arousal: Lethargic   Overall Cognitive Status: No family/caregiver present to determine baseline cognitive functioning  General Comments: lethargic initially with increased alerntess with stimulation. patient keeps eyes closed intermittently and needs multi-modal cues and encouragement to participate        Exercises      General Comments General comments (skin integrity, edema, etc.): care plan extended by one week. noted patient may require I&D with poly swap of L knee- would need re-evaluation following surgery.      Pertinent Vitals/Pain Pain  Assessment Pain Assessment: Faces Faces Pain Scale: Hurts even more Pain Location: L knee with extension Pain Descriptors / Indicators: Grimacing, Discomfort Pain Intervention(s): Limited activity within patient's tolerance, Monitored during session, Repositioned    Home Living                          Prior Function            PT Goals (current goals can now be found in the care plan section) Acute Rehab PT Goals Patient Stated Goal: Feel better PT Goal Formulation: With patient Time For Goal Achievement: 10/01/23 Potential to Achieve Goals: Good Progress towards PT goals: Progressing toward goals    Frequency    Min 1X/week      PT Plan      Co-evaluation              AM-PAC PT "6 Clicks" Mobility   Outcome Measure  Help needed turning from your back to your side while in a flat bed without using bedrails?: A Lot Help needed moving from lying on your back to sitting on the side of a flat bed without using bedrails?: Total Help needed moving to and from a bed to a chair (including a wheelchair)?: Total Help needed standing up from a chair using your arms (e.g., wheelchair or bedside chair)?: Total Help needed to walk in hospital room?: Total Help needed climbing 3-5 steps with a railing? : Total 6 Click Score: 7    End of Session   Activity Tolerance: Patient limited by fatigue;Patient limited by lethargy;Patient limited by pain Patient left: in bed;with call bell/phone within reach;with bed alarm set;with nursing/sitter in room (nurse in the room) Nurse Communication: Mobility status PT Visit Diagnosis: Other abnormalities of gait and mobility (R26.89);History of falling (Z91.81);Muscle weakness (generalized) (M62.81);Pain;BPPV;Dizziness and giddiness (R42)     Time: 1414-1430 PT Time Calculation (min) (ACUTE ONLY): 16 min  Charges:    $Therapeutic Activity: 8-22 mins PT General Charges $$ ACUTE PT VISIT: 1 Visit                      Donna Bernard, PT, MPT   Ina Homes 09/24/2023, 2:44 PM

## 2023-09-24 NOTE — Plan of Care (Signed)
  Problem: Nutritional: Goal: Maintenance of adequate nutrition will improve Outcome: Progressing   Problem: Skin Integrity: Goal: Risk for impaired skin integrity will decrease Outcome: Progressing   Problem: Activity: Goal: Risk for activity intolerance will decrease Outcome: Progressing   Problem: Nutrition: Goal: Adequate nutrition will be maintained Outcome: Progressing   Problem: Pain Managment: Goal: General experience of comfort will improve Outcome: Progressing   Problem: Safety: Goal: Ability to remain free from injury will improve Outcome: Progressing

## 2023-09-24 NOTE — TOC Progression Note (Signed)
Transition of Care [facility]) - Progression Note    Patient Details  Name: Mathew Simpson MRN: [id number] Date of Birth: Jan 31, 1936  Transition of Care [facility]) CM/SW Contact  Baldemar Lenis, [place] Phone Number: 09/24/2023, 10:04 AM  Clinical Narrative:   CSW following for placement at SNF when medically stable.     Expected Discharge Plan: Skilled Nursing Facility Barriers to Discharge: Continued Medical Work up, English as a second language teacher  Expected Discharge Plan and Services     Post Acute Care Choice: Skilled Nursing Facility Living arrangements for the past 2 months: Single Family Home                                       Social Determinants of Health (SDOH) Interventions SDOH Screenings   Food Insecurity: No Food Insecurity (09/10/2023)  Housing: Low Risk  (09/10/2023)  Transportation Needs: No Transportation Needs (09/10/2023)  Utilities: Not At Risk (09/10/2023)  Tobacco Use: Medium Risk (09/08/2023)    Readmission Risk Interventions    07/23/2022   11:40 AM 07/21/2022   10:11 AM  Readmission Risk Prevention Plan  Post Dischage Appt Complete Complete  Medication Screening Complete Complete  Transportation Screening Complete Complete

## 2023-09-25 DIAGNOSIS — I482 Chronic atrial fibrillation, unspecified: Secondary | ICD-10-CM | POA: Diagnosis not present

## 2023-09-25 DIAGNOSIS — B9561 Methicillin susceptible Staphylococcus aureus infection as the cause of diseases classified elsewhere: Secondary | ICD-10-CM | POA: Diagnosis not present

## 2023-09-25 DIAGNOSIS — R7881 Bacteremia: Secondary | ICD-10-CM | POA: Diagnosis not present

## 2023-09-25 DIAGNOSIS — R001 Bradycardia, unspecified: Secondary | ICD-10-CM | POA: Diagnosis not present

## 2023-09-25 DIAGNOSIS — E119 Type 2 diabetes mellitus without complications: Secondary | ICD-10-CM | POA: Diagnosis not present

## 2023-09-25 DIAGNOSIS — S06309D Unspecified focal traumatic brain injury with loss of consciousness of unspecified duration, subsequent encounter: Secondary | ICD-10-CM | POA: Diagnosis not present

## 2023-09-25 LAB — CBC
HCT: 36.8 % — ABNORMAL LOW (ref 39.0–52.0)
Hemoglobin: 12.2 g/dL — ABNORMAL LOW (ref 13.0–17.0)
MCH: 30.1 pg (ref 26.0–34.0)
MCHC: 33.2 g/dL (ref 30.0–36.0)
MCV: 90.9 fL (ref 80.0–100.0)
Platelets: 130 10*3/uL — ABNORMAL LOW (ref 150–400)
RBC: 4.05 MIL/uL — ABNORMAL LOW (ref 4.22–5.81)
RDW: 14.5 % (ref 11.5–15.5)
WBC: 8.6 10*3/uL (ref 4.0–10.5)
nRBC: 0 % (ref 0.0–0.2)

## 2023-09-25 LAB — BASIC METABOLIC PANEL
Anion gap: 12 (ref 5–15)
BUN: 18 mg/dL (ref 8–23)
CO2: 25 mmol/L (ref 22–32)
Calcium: 7.9 mg/dL — ABNORMAL LOW (ref 8.9–10.3)
Chloride: 104 mmol/L (ref 98–111)
Creatinine, Ser: 0.94 mg/dL (ref 0.61–1.24)
GFR, Estimated: >60 mL/min (ref >60)
Glucose, Bld: 149 mg/dL — ABNORMAL HIGH (ref 70–99)
Potassium: 3.2 mmol/L — ABNORMAL LOW (ref 3.5–5.1)
Sodium: 141 mmol/L (ref 135–145)

## 2023-09-25 LAB — GLUCOSE, CAPILLARY
Glucose-Capillary: 150 mg/dL — ABNORMAL HIGH (ref 70–99)
Glucose-Capillary: 253 mg/dL — ABNORMAL HIGH (ref 70–99)
Glucose-Capillary: 153 mg/dL — ABNORMAL HIGH (ref 70–99)
Glucose-Capillary: 141 mg/dL — ABNORMAL HIGH (ref 70–99)

## 2023-09-25 LAB — BODY FLUID CULTURE W GRAM STAIN

## 2023-09-25 LAB — MAGNESIUM: Magnesium: 2.2 mg/dL (ref 1.7–2.4)

## 2023-09-25 MED ORDER — POTASSIUM CHLORIDE CRYS ER 20 MEQ PO TBCR
40.0000 meq | EXTENDED_RELEASE_TABLET | Freq: Once | ORAL | Status: AC
  Administered 2023-09-25: 40 meq via ORAL
  Filled 2023-09-25: qty 2

## 2023-09-25 NOTE — Progress Notes (Addendum)
PROGRESS NOTE    Mathew Simpson  DGU:[id number] DOB: 12-04-1936 DOA: 09/08/2023 PCP: [name], PA-C    Brief Narrative:   Mathew Simpson is a 87 y.o. male with past medical history of  persistent atrial fibrillation on Eliquis, HTN, type 2 diabetes mellitus, vertigo  presented to the [facility] ED on 09/08/23 via EMS from home after mechanical fall at home, might have fallen backwards with loss of consciousness.  Found by neighbor.  Patient was brought in by EMS and was having dizziness nausea vomiting.  Patient was on Eliquis as outpatient.  In the ED, patient had stable vitals.  Labs showed creatinine of 1.3.  Alcohol level less than 10.  Procalcitonin less than 0.10.  CK 75.  Chest x-ray with low lung volumes.  Pelvis x-ray negative.  CT head and C-spine without contrast with 3 mm subdural hematoma anterior falx, no midline shift, 6 mm hemorrhagic contusion posterior left frontal lobe, asymmetric widening of the space between the dens and left lateral mass of the C1 relative the right lateral mass. Patient was seen by neurosurgery. Follow-up CT scans demonstrated stable appearance small anterior falcine subdural and left frontal contusion does not require any acute intervention. Dr. Conchita Paris; with neurosurgery recommended  holding Eliquis for 2 weeks, no requirement of cervical immobilization and outpatient follow-up in neurosurg clinic as needed.    Assessment & Plan:   Subdural hematoma Traumatic brain injury Left frontal contusion  Scalp laceration Mechanical fall  Neurosurgery followed the patient during hospitalization.  Conservative treatment at this time.  Recommendation of holding Eliquis for 2 weeks, no requirement of cervical immobilization and outpatient follow-up in neurosurg clinic as needed.  Staples removed on 09/18/2023.  CT head 09/20/23 with interval decrease in subdural hematoma. Continue to hold Eliquis x 2 weeks  Left knee pain, effusion secondary to MSSA septic arthritis.   Status post arthrocentesis by orthopedics on 09/23/2023 with cultures showing methicillin sensitive Staph aureus.  On cefazolin.  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  Will likely need I&D with poly swap as per orthopedics on 09/28/2023.  MSSA Bacteremia  Urinalysis negative.  Chest x-ray without any infiltrate.    CT scan of the chest abdomen pelvis without any acute findings.  TTE with LVEF 55-60%, RV systolic function mildly decreased, biatrial enlargement,  no notation of valvular vegetation although technically difficult study.  ID was consulted.  Blood cultures from 09/20/2023 with MSSA, repeat blood cultures from 09/22/2023 negative in 3 days.  Procalcitonin elevated at 5.5 but trending down. MRSA PCR negative.  Left knee aspirations with MSSA..  Currently on Cefazolin 2 g IV every 8 hours.  Continue supportive care.  Plan for joint washout on Monday by orthopedics.  Persistent atrial fibrillation with sinus pauses Seen by cardiology during hospitalization.  2D echocardiogram with LVEF 65 percent, normal RV function, mild biatrial enlargement, mild AI, dilated aortic root measuring 43 mm.  No indication for permanent pacemaker placement.  Cardizem dose has been decreased to 120 mg daily..  Recommended Zio patch for 2 weeks on discharge.  Essential hypertension Continue diltiazem and Irbesartan.  Blood pressure seems to be stable.  Vertigo -- Continue PT  Type 2 diabetes mellitus Latest Hemoglobin A1c 6.7.  Continue sliding scale insulin.  Glycemic control adequate.  Moderate protein calorie malnutrition Body mass index is 27.98 kg/m. Etiology: social / environmental circumstances Signs/Symptoms: mild fat depletion, mild muscle depletion Interventions: Ensure Enlive (each supplement provides 350kcal and 20 grams of  protein), MVI Seen by dietitian.  Continue nutritional supplementation.  Hypokalemia.  Potassium 3.2.  Will replace.  Check levels in  AM.  Hospital-acquired delirium Delirium precautions, Continue melatonin and Seroquel  Weakness/debility/deconditioning/gait disturbance: PT/OT initially recommendied SNF placement, denied by insurance carrier following peer to peer. --TOC following, family appeal for insurance denial now approved, has authorization through 10/5   DVT prophylaxis: enoxaparin (LOVENOX) injection 40 mg Start: 09/24/23 1415 SCDs Start: 09/08/23 2101    Code Status: Full Code  Family Communication:  Spoke with the patient's daughter Ms. [name] on the phone and updated her about the clinical condition of the patient on 09/24/2023. Marland Kitchen  Disposition Plan:  Level of care: Telemetry Medical Status is: Inpatient  Remains inpatient appropriate because: MSSA bacteremia, left knee joint septic arthritis needing joint wash, pending SNF placement   Consultants:  Neurosurgery, Dr. Conchita Paris Cardiology Infectious disease Orthopedics  Procedures:  TTE Arthrocentesis 09-23-23  Antimicrobials:  Vancomycin 9/29 - 9/29 Metronidazole 9/29 - 9/30 Aztreonam 9/29 - 9/30 Cefazolin 9/30>>   Subjective: Today, patient was seen and examined at bedside.  Appears to be mildly sleepy.  Poor historian.  Complains of knee pain when asked and touched.  Objective: Vitals:   09/25/23 0000 09/25/23 0423 09/25/23 0844 09/25/23 1136  BP:  127/67 (!) 157/69 (!) 142/76  Pulse:  64 92 67  Resp: (!) 22 16 18 20   Temp:  97.9 F (36.6 C) 98.7 F (37.1 C) 98.3 F (36.8 C)  TempSrc:  Oral Oral Oral  SpO2:  97% 98% 100%  Weight:      Height:        Intake/Output Summary (Last 24 hours) at 09/25/2023 1320 Last data filed at 09/25/2023 1141 Gross per 24 hour  Intake 888.56 ml  Output 2000 ml  Net -1111.44 ml   Filed Weights   09/08/23 1601  Weight: 88.5 kg    Physical examination:  General: Elderly male, mildly Communicative, somnolent at times, appears chronically ill,  HENT:   No scleral pallor or icterus  noted. Oral mucosa is moist.   Chest:   Diminished breath sounds bilaterally. No crackles or wheezes.  CVS: S1 &S2 heard. No murmur.  Regular rate and rhythm. Abdomen: Soft, nontender, nondistended.  Bowel sounds are heard.   Extremities: No cyanosis, clubbing or edema.  Peripheral pulses are palpable.  Left knee  tenderness with effusion.   Psych: Somnolent, Communicative, oriented to place. CNS:  No cranial nerve deficits.  Restricted left knee movement. Skin: Warm and dry.  Scalp laceration.  Data Reviewed: I have personally reviewed the following labs and imaging studies.     CBC: Recent Labs  Lab 09/20/23 1202 09/20/23 2255 09/22/23 0511 09/23/23 0503 09/25/23 0637  WBC 6.5 6.4 6.9 6.9 8.6  HGB 13.4 13.9 12.7* 11.6* 12.2*  HCT 40.6 41.3 37.9* 35.5* 36.8*  MCV 91.4 90.2 92.2 90.3 90.9  PLT 136* 138* 101* 98* 130*   Basic Metabolic Panel: Recent Labs  Lab 09/20/23 1202 09/22/23 0511 09/23/23 0503 09/25/23 0637  NA 142 147* 141 141  K 3.4* 3.6 3.2* 3.2*  CL 110 118* 113* 104  CO2 22 19* 19* 25  GLUCOSE 215* 127* 163* 149*  BUN 31* 31* 27* 18  CREATININE 1.28* 1.33* 1.05 0.94  CALCIUM 8.2* 8.0* 7.9* 7.9*  MG 1.9 2.2  --  2.2  PHOS  --  2.6  --   --    GFR: Estimated Creatinine Clearance: 64.4 mL/min (by C-G formula based on  SCr of 0.94 mg/dL). Liver Function Tests: Recent Labs  Lab 09/22/23 0511  AST 53*  ALT 52*  ALKPHOS 70  BILITOT 0.9  PROT 5.6*  ALBUMIN 2.3*   No results for input(s): "LIPASE", "AMYLASE" in the last 168 hours. No results for input(s): "AMMONIA" in the last 168 hours. Coagulation Profile: No results for input(s): "INR", "PROTIME" in the last 168 hours. Cardiac Enzymes: No results for input(s): "CKTOTAL", "CKMB", "CKMBINDEX", "TROPONINI" in the last 168 hours. BNP (last 3 results) No results for input(s): "PROBNP" in the last 8760 hours. HbA1C: No results for input(s): "HGBA1C" in the last 72 hours. CBG: Recent Labs  Lab  09/24/23 1215 09/24/23 1612 09/24/23 2109 09/25/23 0615 09/25/23 1140  GLUCAP 172* 167* 218* 150* 253*   Lipid Profile: No results for input(s): "CHOL", "HDL", "LDLCALC", "TRIG", "CHOLHDL", "LDLDIRECT" in the last 72 hours. Thyroid Function Tests: No results for input(s): "TSH", "T4TOTAL", "FREET4", "T3FREE", "THYROIDAB" in the last 72 hours.  Anemia Panel: No results for input(s): "VITAMINB12", "FOLATE", "FERRITIN", "TIBC", "IRON", "RETICCTPCT" in the last 72 hours. Sepsis Labs: Recent Labs  Lab 09/20/23 2255 09/21/23 0241 09/22/23 0511 09/22/23 0620 09/23/23 0503  PROCALCITON 8.44  --   --  8.09 5.05  LATICACIDVEN 2.7* 1.5 1.4  --   --     Recent Results (from the past 240 hour(s))  Urine Culture (for pregnant, neutropenic or urologic patients or patients with an indwelling urinary catheter)     Status: Abnormal   Collection Time: 09/20/23  9:47 PM   Specimen: Urine, Clean Catch  Result Value Ref Range Status   Specimen Description URINE, CLEAN CATCH  Final   Special Requests   Final    NONE Performed at [facility], [address] [place], [place] [address]    Culture 60,000 COLONIES/mL STAPHYLOCOCCUS AUREUS (A)  Final   Report Status 09/23/2023 FINAL  Final   Organism ID, Bacteria STAPHYLOCOCCUS AUREUS (A)  Final      Susceptibility   Staphylococcus aureus - MIC*    CIPROFLOXACIN <=0.5 SENSITIVE Sensitive     GENTAMICIN <=0.5 SENSITIVE Sensitive     NITROFURANTOIN <=16 SENSITIVE Sensitive     OXACILLIN <=0.25 SENSITIVE Sensitive     TETRACYCLINE <=1 SENSITIVE Sensitive     VANCOMYCIN <=0.5 SENSITIVE Sensitive     TRIMETH/SULFA <=10 SENSITIVE Sensitive     CLINDAMYCIN <=0.25 SENSITIVE Sensitive     RIFAMPIN <=0.5 SENSITIVE Sensitive     Inducible Clindamycin NEGATIVE Sensitive     LINEZOLID 2 SENSITIVE Sensitive     * 60,000 COLONIES/mL STAPHYLOCOCCUS AUREUS  Culture, blood (Routine X 2) w Reflex to ID Panel     Status: Abnormal   Collection Time:  09/20/23 10:54 PM   Specimen: BLOOD RIGHT ARM  Result Value Ref Range Status   Specimen Description BLOOD RIGHT ARM  Final   Special Requests   Final    BOTTLES DRAWN AEROBIC AND ANAEROBIC Blood Culture adequate volume   Culture  Setup Time   Final    GRAM POSITIVE COCCI IN CLUSTERS IN BOTH AEROBIC AND ANAEROBIC BOTTLES CRITICAL VALUE NOTED.  VALUE IS CONSISTENT WITH PREVIOUSLY REPORTED AND CALLED VALUE.    Culture (A)  Final    STAPHYLOCOCCUS AUREUS SUSCEPTIBILITIES PERFORMED ON PREVIOUS CULTURE WITHIN THE LAST 5 DAYS. Performed at [facility], [address], [place], [place] [address]    Report Status 09/23/2023 FINAL  Final  Culture, blood (Routine X 2) w Reflex to ID Panel  Status: Abnormal   Collection Time: 09/20/23 10:56 PM   Specimen: BLOOD RIGHT HAND  Result Value Ref Range Status   Specimen Description BLOOD RIGHT HAND  Final   Special Requests   Final    BOTTLES DRAWN AEROBIC AND ANAEROBIC Blood Culture adequate volume   Culture  Setup Time   Final    GRAM POSITIVE COCCI IN CLUSTERS IN BOTH AEROBIC AND ANAEROBIC BOTTLES CRITICAL RESULT CALLED TO, READ BACK BY AND VERIFIED WITH: Salina April PHARMD, AT 1144 09/21/23 Performed at [facility], [address], [place], [place] [address]    Culture STAPHYLOCOCCUS AUREUS (A)  Final   Report Status 09/23/2023 FINAL  Final   Organism ID, Bacteria STAPHYLOCOCCUS AUREUS  Final      Susceptibility   Staphylococcus aureus - MIC*    CIPROFLOXACIN <=0.5 SENSITIVE Sensitive     ERYTHROMYCIN <=0.25 SENSITIVE Sensitive     GENTAMICIN <=0.5 SENSITIVE Sensitive     OXACILLIN 0.5 SENSITIVE Sensitive     TETRACYCLINE <=1 SENSITIVE Sensitive     VANCOMYCIN <=0.5 SENSITIVE Sensitive     TRIMETH/SULFA <=10 SENSITIVE Sensitive     CLINDAMYCIN <=0.25 SENSITIVE Sensitive     RIFAMPIN <=0.5 SENSITIVE Sensitive     Inducible Clindamycin NEGATIVE Sensitive     LINEZOLID 2 SENSITIVE Sensitive     * STAPHYLOCOCCUS AUREUS   Blood Culture ID Panel (Reflexed)     Status: Abnormal   Collection Time: 09/20/23 10:56 PM  Result Value Ref Range Status   Enterococcus faecalis NOT DETECTED NOT DETECTED Final   Enterococcus Faecium NOT DETECTED NOT DETECTED Final   Listeria monocytogenes NOT DETECTED NOT DETECTED Final   Staphylococcus species DETECTED (A) NOT DETECTED Final    Comment: CRITICAL RESULT CALLED TO, READ BACK BY AND VERIFIED WITH: A. UTOMWEN PHARMD, AT 1144 09/21/23    Staphylococcus aureus (BCID) DETECTED (A) NOT DETECTED Final    Comment: CRITICAL RESULT CALLED TO, READ BACK BY AND VERIFIED WITH: A. UTOMWEN PHARMD, AT 1144 09/21/23    Staphylococcus epidermidis NOT DETECTED NOT DETECTED Final   Staphylococcus lugdunensis NOT DETECTED NOT DETECTED Final   Streptococcus species NOT DETECTED NOT DETECTED Final   Streptococcus agalactiae NOT DETECTED NOT DETECTED Final   Streptococcus pneumoniae NOT DETECTED NOT DETECTED Final   Streptococcus pyogenes NOT DETECTED NOT DETECTED Final   A.calcoaceticus-baumannii NOT DETECTED NOT DETECTED Final   Bacteroides fragilis NOT DETECTED NOT DETECTED Final   Enterobacterales NOT DETECTED NOT DETECTED Final   Enterobacter cloacae complex NOT DETECTED NOT DETECTED Final   Escherichia coli NOT DETECTED NOT DETECTED Final   Klebsiella aerogenes NOT DETECTED NOT DETECTED Final   Klebsiella oxytoca NOT DETECTED NOT DETECTED Final   Klebsiella pneumoniae NOT DETECTED NOT DETECTED Final   Proteus species NOT DETECTED NOT DETECTED Final   Salmonella species NOT DETECTED NOT DETECTED Final   Serratia marcescens NOT DETECTED NOT DETECTED Final   Haemophilus influenzae NOT DETECTED NOT DETECTED Final   Neisseria meningitidis NOT DETECTED NOT DETECTED Final   Pseudomonas aeruginosa NOT DETECTED NOT DETECTED Final   Stenotrophomonas maltophilia NOT DETECTED NOT DETECTED Final   Candida albicans NOT DETECTED NOT DETECTED Final   Candida auris NOT DETECTED NOT DETECTED Final    Candida glabrata NOT DETECTED NOT DETECTED Final   Candida krusei NOT DETECTED NOT DETECTED Final   Candida parapsilosis NOT DETECTED NOT DETECTED Final   Candida tropicalis NOT DETECTED NOT DETECTED Final   Cryptococcus neoformans/gattii NOT DETECTED NOT DETECTED Final  Meth resistant mecA/C and MREJ NOT DETECTED NOT DETECTED Final    Comment: Performed at [facility], [address] [place], [place] [address]  MRSA Next Gen by PCR, Nasal     Status: None   Collection Time: 09/21/23  7:12 AM   Specimen: Nasal Mucosa; Nasal Swab  Result Value Ref Range Status   MRSA by PCR Next Gen NOT DETECTED NOT DETECTED Final    Comment: (NOTE) The GeneXpert MRSA Assay (FDA approved for NASAL specimens only), is one component of a comprehensive MRSA colonization surveillance program. It is not intended to diagnose MRSA infection nor to guide or monitor treatment for MRSA infections. Test performance is not FDA approved in patients less than 83 years old. Performed at [facility], [address] [place], [place] [address]   Culture, blood (Routine X 2) w Reflex to ID Panel     Status: None (Preliminary result)   Collection Time: 09/22/23  6:20 AM   Specimen: BLOOD RIGHT HAND  Result Value Ref Range Status   Specimen Description BLOOD RIGHT HAND  Final   Special Requests   Final    BOTTLES DRAWN AEROBIC AND ANAEROBIC Blood Culture results may not be optimal due to an excessive volume of blood received in culture bottles   Culture   Final    NO GROWTH 3 DAYS Performed at [facility], [address], [place], [place] [address]    Report Status PENDING  Incomplete  Culture, blood (Routine X 2) w Reflex to ID Panel     Status: None (Preliminary result)   Collection Time: 09/22/23  6:25 AM   Specimen: BLOOD LEFT HAND  Result Value Ref Range Status   Specimen Description BLOOD LEFT HAND  Final   Special Requests   Final    BOTTLES DRAWN AEROBIC AND ANAEROBIC Blood Culture  results may not be optimal due to an excessive volume of blood received in culture bottles   Culture   Final    NO GROWTH 3 DAYS Performed at [facility], [address], [place], [place] [address]    Report Status PENDING  Incomplete  Body fluid culture w Gram Stain     Status: None   Collection Time: 09/23/23  2:20 PM   Specimen: Synovium; Body Fluid  Result Value Ref Range Status   Specimen Description SYNOVIAL LEFT KNEE  Final   Special Requests NONE  Final   Gram Stain   Final    ABUNDANT WBC PRESENT, PREDOMINANTLY PMN FEW GRAM POSITIVE COCCI IN PAIRS CRITICAL RESULT CALLED TO, READ BACK BY AND VERIFIED WITH: RN ALEXIS MORGAN ON 09/23/23 @ 1909 BY DRT Performed at [facility], [address], [place], [place] [address]    Culture MODERATE STAPHYLOCOCCUS AUREUS  Final   Report Status 09/25/2023 FINAL  Final   Organism ID, Bacteria STAPHYLOCOCCUS AUREUS  Final      Susceptibility   Staphylococcus aureus - MIC*    CIPROFLOXACIN <=0.5 SENSITIVE Sensitive     ERYTHROMYCIN <=0.25 SENSITIVE Sensitive     GENTAMICIN <=0.5 SENSITIVE Sensitive     OXACILLIN 0.5 SENSITIVE Sensitive     TETRACYCLINE <=1 SENSITIVE Sensitive     VANCOMYCIN <=0.5 SENSITIVE Sensitive     TRIMETH/SULFA <=10 SENSITIVE Sensitive     CLINDAMYCIN <=0.25 SENSITIVE Sensitive     RIFAMPIN <=0.5 SENSITIVE Sensitive     Inducible Clindamycin NEGATIVE Sensitive     LINEZOLID 2 SENSITIVE Sensitive     *  MODERATE STAPHYLOCOCCUS AUREUS      Radiology Studies: No results found.   Scheduled Meds:  diltiazem  120 mg Oral Daily   enoxaparin (LOVENOX) injection  40 mg Subcutaneous Q1200   feeding supplement  1 Container Oral TID BM   insulin aspart  0-5 Units Subcutaneous QHS   insulin aspart  0-9 Units Subcutaneous TID WC   irbesartan  300 mg Oral Daily   melatonin  6 mg Oral QHS   metoprolol tartrate  12.5 mg Oral BID   multivitamin with minerals  1 tablet Oral Daily   polyethylene glycol  17 g  Oral BID   QUEtiapine  12.5 mg Oral QHS   Continuous Infusions:   ceFAZolin (ANCEF) IV Stopped (09/25/23 0716)      LOS: 8 days    Joycelyn Das, MD [facility] Available via Epic secure chat 7am-7pm After these hours, please refer to coverage provider listed on [email or web address] 09/25/2023, 1:20 PM

## 2023-09-25 NOTE — Progress Notes (Signed)
[facility]  Date of Admission:  09/08/2023      Total days of antibiotics   Cefazolin 9/29 >> c           ASSESSMENT: Mathew Simpson is a 87 y.o. male   MSSA Bacteremia, Nosocomial -  -still with some redness at right upper arm, suspect PIV related  -continue cefazolin IV  -TTE repeat looks un-concerning. Will see if we can do TEE in OR Monday 10/7 to limit anesthesia - however not an absolute need in this patient given he will have 6 weeks IV abx + oral tail for minimum 6 months  -While we did not do blood cultures on admission, there was no reason to as there was no infectious concern at that time.  -This appears most c/w nosocomial acquired bacteremia from PIV that has seeded his knee.  -follow repeat BCx and fever curve.  -PICC plan for next week - will need 6 week course.  -Left knee PJI   Rt Arm Phlebitis -  -Improving  -likely source initially then seeded prosthesis    Left Knee Effusion, Pain -  TKR in place -  -GPC noted on stain - WBC 44,400 cells (87%N) -Ortho following and planning tentative surgery Monday 10/7  -DOAC will interfere with rifampin and prohibits use    PLAN: Continue cefazolin  Follow repeat BCx for PICC timing - will need 6 weeks IV from surgery  Plan polyliner exchange and I&D with ortho 10/7   Dr. Renold Don is available over the weekend for ID related questions.    Principal Problem:   Bleeding in head following injury with loss of consciousness (HCC) Active Problems:   Essential hypertension   Chronic atrial fibrillation (HCC)   Subdural hematoma (HCC)   DM2 (diabetes mellitus, type 2) (HCC)   Neck injury   Fall   Scalp laceration   Bradycardia   Protein-calorie malnutrition, moderate (HCC)   SDH (subdural hematoma) (HCC)   Staphylococcus aureus bacteremia   Effusion of left knee   Prosthetic joint infection (HCC)    bupivacaine(PF)  10 mL Infiltration Once   diltiazem  120 mg Oral Daily    enoxaparin (LOVENOX) injection  40 mg Subcutaneous Q1200   feeding supplement  1 Container Oral TID BM   insulin aspart  0-5 Units Subcutaneous QHS   insulin aspart  0-9 Units Subcutaneous TID WC   irbesartan  300 mg Oral Daily   melatonin  6 mg Oral QHS   metoprolol tartrate  12.5 mg Oral BID   multivitamin with minerals  1 tablet Oral Daily   polyethylene glycol  17 g Oral BID   QUEtiapine  12.5 mg Oral QHS    SUBJECTIVE: Sleepy today - does not remember conversations re: surgery for knee    Review of Systems: Review of Systems  Constitutional:  Negative for chills and fever.  Gastrointestinal:  Negative for abdominal pain, nausea and vomiting.  Psychiatric/Behavioral:  Negative for suicidal ideas.      Allergies  Allergen Reactions   Penicillins Anaphylaxis    Tolerates ancef  Did it involve swelling of the face/tongue/throat, SOB, or low BP? Yes Did it involve sudden or severe rash/hives, skin peeling, or any reaction on the inside of your mouth or nose? Unknown Did you need to seek medical attention at a hospital or doctor's office? Yes When did it last happen?      50+ years If all  above answers are "NO", may proceed with cephalosporin use.     OBJECTIVE: Vitals:   09/25/23 0000 09/25/23 0423 09/25/23 0844 09/25/23 1136  BP:  127/67 (!) 157/69 (!) 142/76  Pulse:  64 92 67  Resp: (!) 22 16 18 20   Temp:  97.9 F (36.6 C) 98.7 F (37.1 C) 98.3 F (36.8 C)  TempSrc:  Oral Oral Oral  SpO2:  97% 98% 100%  Weight:      Height:       Body mass index is 27.98 kg/m.   Physical Exam Constitutional:      Appearance: Normal appearance.  Cardiovascular:     Rate and Rhythm: Normal rate.     Heart sounds: No murmur heard. Pulmonary:     Effort: No respiratory distress.     Breath sounds: Normal breath sounds.  Abdominal:     General: Bowel sounds are normal. There is no distension.  Musculoskeletal:     Left knee: Swelling and effusion present. Decreased  range of motion. Tenderness present.  Skin:    General: Skin is warm and dry.     Capillary Refill: Capillary refill takes less than 2 seconds.     Findings: No rash.  Neurological:     Mental Status: He is alert and oriented to person, place, and time.      Lab Results Lab Results  Component Value Date   WBC 8.6 09/25/2023   HGB 12.2 (L) 09/25/2023   HCT 36.8 (L) 09/25/2023   MCV 90.9 09/25/2023   PLT 130 (L) 09/25/2023    Lab Results  Component Value Date   CREATININE 0.94 09/25/2023   BUN 18 09/25/2023   NA 141 09/25/2023   K 3.2 (L) 09/25/2023   CL 104 09/25/2023   CO2 25 09/25/2023    Lab Results  Component Value Date   ALT 52 (H) 09/22/2023   AST 53 (H) 09/22/2023   ALKPHOS 70 09/22/2023   BILITOT 0.9 09/22/2023     Microbiology: Recent Results (from the past 240 hour(s))  Urine Culture (for pregnant, neutropenic or urologic patients or patients with an indwelling urinary catheter)     Status: Abnormal   Collection Time: 09/20/23  9:47 PM   Specimen: Urine, Clean Catch  Result Value Ref Range Status   Specimen Description URINE, CLEAN CATCH  Final   Special Requests   Final    NONE Performed at [facility], [address] [place], [place] [address]    Culture 60,000 COLONIES/mL STAPHYLOCOCCUS AUREUS (A)  Final   Report Status 09/23/2023 FINAL  Final   Organism ID, Bacteria STAPHYLOCOCCUS AUREUS (A)  Final      Susceptibility   Staphylococcus aureus - MIC*    CIPROFLOXACIN <=0.5 SENSITIVE Sensitive     GENTAMICIN <=0.5 SENSITIVE Sensitive     NITROFURANTOIN <=16 SENSITIVE Sensitive     OXACILLIN <=0.25 SENSITIVE Sensitive     TETRACYCLINE <=1 SENSITIVE Sensitive     VANCOMYCIN <=0.5 SENSITIVE Sensitive     TRIMETH/SULFA <=10 SENSITIVE Sensitive     CLINDAMYCIN <=0.25 SENSITIVE Sensitive     RIFAMPIN <=0.5 SENSITIVE Sensitive     Inducible Clindamycin NEGATIVE Sensitive     LINEZOLID 2 SENSITIVE Sensitive     * 60,000 COLONIES/mL  STAPHYLOCOCCUS AUREUS  Culture, blood (Routine X 2) w Reflex to ID Panel     Status: Abnormal   Collection Time: 09/20/23 10:54 PM   Specimen: BLOOD RIGHT ARM  Result Value Ref Range Status  Specimen Description BLOOD RIGHT ARM  Final   Special Requests   Final    BOTTLES DRAWN AEROBIC AND ANAEROBIC Blood Culture adequate volume   Culture  Setup Time   Final    GRAM POSITIVE COCCI IN CLUSTERS IN BOTH AEROBIC AND ANAEROBIC BOTTLES CRITICAL VALUE NOTED.  VALUE IS CONSISTENT WITH PREVIOUSLY REPORTED AND CALLED VALUE.    Culture (A)  Final    STAPHYLOCOCCUS AUREUS SUSCEPTIBILITIES PERFORMED ON PREVIOUS CULTURE WITHIN THE LAST 5 DAYS. Performed at [facility], [address], [place], [place] [address]    Report Status 09/23/2023 FINAL  Final  Culture, blood (Routine X 2) w Reflex to ID Panel     Status: Abnormal   Collection Time: 09/20/23 10:56 PM   Specimen: BLOOD RIGHT HAND  Result Value Ref Range Status   Specimen Description BLOOD RIGHT HAND  Final   Special Requests   Final    BOTTLES DRAWN AEROBIC AND ANAEROBIC Blood Culture adequate volume   Culture  Setup Time   Final    GRAM POSITIVE COCCI IN CLUSTERS IN BOTH AEROBIC AND ANAEROBIC BOTTLES CRITICAL RESULT CALLED TO, READ BACK BY AND VERIFIED WITHSalina April PHARMD, AT 1144 09/21/23 Performed at [facility], [address] [place], [place] [address]    Culture STAPHYLOCOCCUS AUREUS (A)  Final   Report Status 09/23/2023 FINAL  Final   Organism ID, Bacteria STAPHYLOCOCCUS AUREUS  Final      Susceptibility   Staphylococcus aureus - MIC*    CIPROFLOXACIN <=0.5 SENSITIVE Sensitive     ERYTHROMYCIN <=0.25 SENSITIVE Sensitive     GENTAMICIN <=0.5 SENSITIVE Sensitive     OXACILLIN 0.5 SENSITIVE Sensitive     TETRACYCLINE <=1 SENSITIVE Sensitive     VANCOMYCIN <=0.5 SENSITIVE Sensitive     TRIMETH/SULFA <=10 SENSITIVE Sensitive     CLINDAMYCIN <=0.25 SENSITIVE Sensitive     RIFAMPIN <=0.5 SENSITIVE Sensitive      Inducible Clindamycin NEGATIVE Sensitive     LINEZOLID 2 SENSITIVE Sensitive     * STAPHYLOCOCCUS AUREUS  Blood Culture ID Panel (Reflexed)     Status: Abnormal   Collection Time: 09/20/23 10:56 PM  Result Value Ref Range Status   Enterococcus faecalis NOT DETECTED NOT DETECTED Final   Enterococcus Faecium NOT DETECTED NOT DETECTED Final   Listeria monocytogenes NOT DETECTED NOT DETECTED Final   Staphylococcus species DETECTED (A) NOT DETECTED Final    Comment: CRITICAL RESULT CALLED TO, READ BACK BY AND VERIFIED WITH: A. UTOMWEN PHARMD, AT 1144 09/21/23    Staphylococcus aureus (BCID) DETECTED (A) NOT DETECTED Final    Comment: CRITICAL RESULT CALLED TO, READ BACK BY AND VERIFIED WITH: A. UTOMWEN PHARMD, AT 1144 09/21/23    Staphylococcus epidermidis NOT DETECTED NOT DETECTED Final   Staphylococcus lugdunensis NOT DETECTED NOT DETECTED Final   Streptococcus species NOT DETECTED NOT DETECTED Final   Streptococcus agalactiae NOT DETECTED NOT DETECTED Final   Streptococcus pneumoniae NOT DETECTED NOT DETECTED Final   Streptococcus pyogenes NOT DETECTED NOT DETECTED Final   A.calcoaceticus-baumannii NOT DETECTED NOT DETECTED Final   Bacteroides fragilis NOT DETECTED NOT DETECTED Final   Enterobacterales NOT DETECTED NOT DETECTED Final   Enterobacter cloacae complex NOT DETECTED NOT DETECTED Final   Escherichia coli NOT DETECTED NOT DETECTED Final   Klebsiella aerogenes NOT DETECTED NOT DETECTED Final   Klebsiella oxytoca NOT DETECTED NOT DETECTED Final   Klebsiella pneumoniae NOT DETECTED NOT DETECTED Final   Proteus species NOT DETECTED NOT  DETECTED Final   Salmonella species NOT DETECTED NOT DETECTED Final   Serratia marcescens NOT DETECTED NOT DETECTED Final   Haemophilus influenzae NOT DETECTED NOT DETECTED Final   Neisseria meningitidis NOT DETECTED NOT DETECTED Final   Pseudomonas aeruginosa NOT DETECTED NOT DETECTED Final   Stenotrophomonas maltophilia NOT DETECTED NOT  DETECTED Final   Candida albicans NOT DETECTED NOT DETECTED Final   Candida auris NOT DETECTED NOT DETECTED Final   Candida glabrata NOT DETECTED NOT DETECTED Final   Candida krusei NOT DETECTED NOT DETECTED Final   Candida parapsilosis NOT DETECTED NOT DETECTED Final   Candida tropicalis NOT DETECTED NOT DETECTED Final   Cryptococcus neoformans/gattii NOT DETECTED NOT DETECTED Final   Meth resistant mecA/C and MREJ NOT DETECTED NOT DETECTED Final    Comment: Performed at [facility], [address], [place], [place] [address]  MRSA Next Gen by PCR, Nasal     Status: None   Collection Time: 09/21/23  7:12 AM   Specimen: Nasal Mucosa; Nasal Swab  Result Value Ref Range Status   MRSA by PCR Next Gen NOT DETECTED NOT DETECTED Final    Comment: (NOTE) The GeneXpert MRSA Assay (FDA approved for NASAL specimens only), is one component of a comprehensive MRSA colonization surveillance program. It is not intended to diagnose MRSA infection nor to guide or monitor treatment for MRSA infections. Test performance is not FDA approved in patients less than 71 years old. Performed at [facility], [address] [place], [place] [address]   Culture, blood (Routine X 2) w Reflex to ID Panel     Status: None (Preliminary result)   Collection Time: 09/22/23  6:20 AM   Specimen: BLOOD RIGHT HAND  Result Value Ref Range Status   Specimen Description BLOOD RIGHT HAND  Final   Special Requests   Final    BOTTLES DRAWN AEROBIC AND ANAEROBIC Blood Culture results may not be optimal due to an excessive volume of blood received in culture bottles   Culture   Final    NO GROWTH 3 DAYS Performed at [facility], [address] [place], [place] [address]    Report Status PENDING  Incomplete  Culture, blood (Routine X 2) w Reflex to ID Panel     Status: None (Preliminary result)   Collection Time: 09/22/23  6:25 AM   Specimen: BLOOD LEFT HAND  Result Value Ref Range Status   Specimen  Description BLOOD LEFT HAND  Final   Special Requests   Final    BOTTLES DRAWN AEROBIC AND ANAEROBIC Blood Culture results may not be optimal due to an excessive volume of blood received in culture bottles   Culture   Final    NO GROWTH 3 DAYS Performed at [facility], [address], [place], [place] [address]    Report Status PENDING  Incomplete  Body fluid culture w Gram Stain     Status: None   Collection Time: 09/23/23  2:20 PM   Specimen: Synovium; Body Fluid  Result Value Ref Range Status   Specimen Description SYNOVIAL LEFT KNEE  Final   Special Requests NONE  Final   Gram Stain   Final    ABUNDANT WBC PRESENT, PREDOMINANTLY PMN FEW GRAM POSITIVE COCCI IN PAIRS CRITICAL RESULT CALLED TO, READ BACK BY AND VERIFIED WITH: RN ALEXIS MORGAN ON 09/23/23 @ 1909 BY DRT Performed at [facility], [address], [place], [place] [address]  Culture MODERATE STAPHYLOCOCCUS AUREUS  Final   Report Status 09/25/2023 FINAL  Final   Organism ID, Bacteria STAPHYLOCOCCUS AUREUS  Final      Susceptibility   Staphylococcus aureus - MIC*    CIPROFLOXACIN <=0.5 SENSITIVE Sensitive     ERYTHROMYCIN <=0.25 SENSITIVE Sensitive     GENTAMICIN <=0.5 SENSITIVE Sensitive     OXACILLIN 0.5 SENSITIVE Sensitive     TETRACYCLINE <=1 SENSITIVE Sensitive     VANCOMYCIN <=0.5 SENSITIVE Sensitive     TRIMETH/SULFA <=10 SENSITIVE Sensitive     CLINDAMYCIN <=0.25 SENSITIVE Sensitive     RIFAMPIN <=0.5 SENSITIVE Sensitive     Inducible Clindamycin NEGATIVE Sensitive     LINEZOLID 2 SENSITIVE Sensitive     * MODERATE STAPHYLOCOCCUS AUREUS   Rexene Alberts, MSN, NP-C [facility]  [email or web address] Pager: [phone] Office: [phone] [facility] Main Line: [phone] *Secure Chat Communication Welcome

## 2023-09-25 NOTE — Progress Notes (Signed)
Subjective: Pt resting comfortably in bed; confused; states he still has left knee pain  Objective: Vital signs in last 24 hours: Temp:  [97.9 F (36.6 C)-99.2 F (37.3 C)] 97.9 F (36.6 C) (10/04 0423) Pulse Rate:  [64-97] 64 (10/04 0423) Resp:  [16-22] 16 (10/04 0423) BP: (122-156)/(64-99) 127/67 (10/04 0423) SpO2:  [96 %-99 %] 97 % (10/04 0423)  Intake/Output from previous day: 10/03 0701 - 10/04 0700 In: 548.6 [IV Piggyback:548.6] Out: 1950 [Urine:1950] Intake/Output this shift: No intake/output data recorded.  Recent Labs    09/23/23 0503  HGB 11.6*   Recent Labs    09/23/23 0503  WBC 6.9  RBC 3.93*  HCT 35.5*  PLT 98*   Recent Labs    09/23/23 0503  NA 141  K 3.2*  CL 113*  CO2 19*  BUN 27*  CREATININE 1.05  GLUCOSE 163*  CALCIUM 7.9*   No results for input(s): "LABPT", "INR" in the last 72 hours.  Left knee- No warmth or erythema; moderate effusion; mild tenderness    Assessment/Plan: Septic arthritis left knee- Plan irrigation and debridement with polyethylene exchange on Monday   Mathew Simpson 09/25/2023, 7:06 AM

## 2023-09-25 NOTE — Progress Notes (Signed)
Occupational Therapy Treatment Patient Details Name: Mathew Simpson MRN: [id number] DOB: Jun 23, 1936 Today's Date: 09/25/2023   History of present illness Mathew Simpson is a 87 y.o. male admitted 9/17 to the hospital after presenting to the emergency department after a fall.  CT scan was therefore obtained which demonstrated a small intracranial hemorrhage in the left frontal/parietal region. C-spine CT was worrisome for instability but pt seen by neurosurgery and repeat CT demonstrated stable on no requirement for cervical immobilization. Pt with worsening confusion but CT on 9/29 demonstrated decreased size in SDH.  Pt did have fever 9/29 - ongoing workup, noted possible need for TEE.  PMH for hypertension, glaucoma, hx of B TKR.   OT comments  Patient supine in bed upon arrival into room and agreeable to OT intervention with encouragement in order for patient to participate.  Patient completed UB grooming at bed level due to patient not wanting to sit EOB or attempt stand 2/2 knee pain.  Patient completed UB grooming task with setup.  Patient complete rolling with max A and use of bed rails.  Patient would benefit from additional OT intervention to address functional deficits in order for patient to return to PLOF.      If plan is discharge home, recommend the following:  A little help with walking and/or transfers;A little help with bathing/dressing/bathroom;Assist for transportation;Help with stairs or ramp for entrance;Assistance with cooking/housework   Equipment Recommendations  Other (comment)    Recommendations for Other Services      Precautions / Restrictions Precautions Precautions: Fall Restrictions Weight Bearing Restrictions: No       Mobility Bed Mobility Overal bed mobility: Needs Assistance Bed Mobility: Rolling Rolling: Max assist, Used rails              Transfers                   General transfer comment: patient declining to sit EOB to complete UB  grooming task 2/2 knee pain     Balance                                           ADL either performed or assessed with clinical judgement   ADL Overall ADL's : Needs assistance/impaired Eating/Feeding: Set up   Grooming: Wash/dry hands;Wash/dry face;Set up;Bed level (patient declining to sit EOB 2/2 knee pain)           Upper Body Dressing : Moderate assistance;Bed level (to don clean hospital gown)                          Extremity/Trunk Assessment              Vision       Perception     Praxis      Cognition Arousal: Alert                                              Exercises      Shoulder Instructions       General Comments      Pertinent Vitals/ Pain       Pain Assessment Pain Assessment: 0-10 Pain Score: 5  Faces Pain Scale: Hurts even more Pain Location:  L knee Pain Descriptors / Indicators: Grimacing, Discomfort Pain Intervention(s): Monitored during session  Home Living                                          Prior Functioning/Environment              Frequency  Min 1X/week        Progress Toward Goals  OT Goals(current goals can now be found in the care plan section)  Progress towards OT goals: Progressing toward goals  Acute Rehab OT Goals OT Goal Formulation: With patient Time For Goal Achievement: 09/23/23 Potential to Achieve Goals: Good ADL Goals Pt Will Perform Grooming: with supervision;standing Pt Will Perform Lower Body Bathing: with supervision;sit to/from stand Pt Will Perform Lower Body Dressing: with supervision;sit to/from stand Pt Will Transfer to Toilet: with supervision;ambulating Pt Will Perform Toileting - Clothing Manipulation and hygiene: with supervision;sit to/from stand Additional ADL Goal #1: Pt will report dizziness less than 2/10 with positional changes during bed mobility and transfers.  Plan      Co-evaluation                  AM-PAC OT "6 Clicks" Daily Activity     Outcome Measure   Help from another person eating meals?: A Little Help from another person taking care of personal grooming?: A Little Help from another person toileting, which includes using toliet, bedpan, or urinal?: A Lot Help from another person bathing (including washing, rinsing, drying)?: A Lot Help from another person to put on and taking off regular upper body clothing?: A Lot Help from another person to put on and taking off regular lower body clothing?: A Lot 6 Click Score: 14    End of Session    OT Visit Diagnosis: Unsteadiness on feet (R26.81);Other abnormalities of gait and mobility (R26.89);Repeated falls (R29.6);Pain   Activity Tolerance Patient limited by pain   Patient Left in bed;with call bell/phone within reach;with bed alarm set   Nurse Communication          Time: 2725-3664 OT Time Calculation (min): 17 min  Charges: OT General Charges $OT Visit: 1 Visit OT Treatments $Self Care/Home Management : 8-22 mins Governor Specking OT/L  Denice Paradise 09/25/2023, 3:47 PM

## 2023-09-26 DIAGNOSIS — E119 Type 2 diabetes mellitus without complications: Secondary | ICD-10-CM | POA: Diagnosis not present

## 2023-09-26 DIAGNOSIS — R001 Bradycardia, unspecified: Secondary | ICD-10-CM | POA: Diagnosis not present

## 2023-09-26 DIAGNOSIS — I482 Chronic atrial fibrillation, unspecified: Secondary | ICD-10-CM | POA: Diagnosis not present

## 2023-09-26 DIAGNOSIS — S06309D Unspecified focal traumatic brain injury with loss of consciousness of unspecified duration, subsequent encounter: Secondary | ICD-10-CM | POA: Diagnosis not present

## 2023-09-26 LAB — GLUCOSE, CAPILLARY
Glucose-Capillary: 150 mg/dL — ABNORMAL HIGH (ref 70–99)
Glucose-Capillary: 154 mg/dL — ABNORMAL HIGH (ref 70–99)
Glucose-Capillary: 138 mg/dL — ABNORMAL HIGH (ref 70–99)
Glucose-Capillary: 137 mg/dL — ABNORMAL HIGH (ref 70–99)

## 2023-09-26 LAB — BASIC METABOLIC PANEL
Anion gap: 12 (ref 5–15)
BUN: 19 mg/dL (ref 8–23)
CO2: 24 mmol/L (ref 22–32)
Calcium: 7.8 mg/dL — ABNORMAL LOW (ref 8.9–10.3)
Chloride: 105 mmol/L (ref 98–111)
Creatinine, Ser: 0.96 mg/dL (ref 0.61–1.24)
GFR, Estimated: >60 mL/min (ref >60)
Glucose, Bld: 149 mg/dL — ABNORMAL HIGH (ref 70–99)
Potassium: 3.2 mmol/L — ABNORMAL LOW (ref 3.5–5.1)
Sodium: 141 mmol/L (ref 135–145)

## 2023-09-26 LAB — CBC
HCT: 35.4 % — ABNORMAL LOW (ref 39.0–52.0)
Hemoglobin: 11.7 g/dL — ABNORMAL LOW (ref 13.0–17.0)
MCH: 29.9 pg (ref 26.0–34.0)
MCHC: 33.1 g/dL (ref 30.0–36.0)
MCV: 90.5 fL (ref 80.0–100.0)
Platelets: 150 10*3/uL (ref 150–400)
RBC: 3.91 MIL/uL — ABNORMAL LOW (ref 4.22–5.81)
RDW: 14.4 % (ref 11.5–15.5)
WBC: 8.1 10*3/uL (ref 4.0–10.5)
nRBC: 0 % (ref 0.0–0.2)

## 2023-09-26 LAB — MAGNESIUM: Magnesium: 2.1 mg/dL (ref 1.7–2.4)

## 2023-09-26 MED ORDER — ZINC OXIDE 40 % EX OINT
TOPICAL_OINTMENT | Freq: Every day | CUTANEOUS | Status: DC
  Administered 2023-09-27: 1 via TOPICAL
  Filled 2023-09-26: qty 57

## 2023-09-26 MED ORDER — POTASSIUM CHLORIDE CRYS ER 20 MEQ PO TBCR
40.0000 meq | EXTENDED_RELEASE_TABLET | Freq: Two times a day (BID) | ORAL | Status: AC
  Administered 2023-09-26 – 2023-09-27 (×3): 40 meq via ORAL
  Filled 2023-09-26 (×3): qty 2

## 2023-09-26 MED ORDER — POTASSIUM CHLORIDE CRYS ER 20 MEQ PO TBCR
40.0000 meq | EXTENDED_RELEASE_TABLET | Freq: Once | ORAL | Status: AC
  Administered 2023-09-26: 40 meq via ORAL
  Filled 2023-09-26: qty 2

## 2023-09-26 NOTE — Plan of Care (Signed)

## 2023-09-26 NOTE — Progress Notes (Signed)
AVIR DERUITER  MRN: [id number] DOB/Age: 08/17/1936 87 y.o. Physician: Lynnea Maizes, M.D.   Procedure(s) (LRB): IRRIGATION AND DEBRIDEMENT KNEE WITH POLY EXCHANGE (Left)  Subjective: Resting comfortably in bed this a.m. reports continued left knee pain. Vital Signs Temp:  [97.8 F (36.6 C)-98.8 F (37.1 C)] 98.2 F (36.8 C) (10/05 0751) Pulse Rate:  [62-90] 90 (10/05 0751) Resp:  [17-31] 17 (10/05 0751) BP: (137-162)/(73-88) 162/74 (10/05 0751) SpO2:  [95 %-100 %] 97 % (10/05 0751)  Lab Results Recent Labs    09/25/23 0637 09/26/23 0357  WBC 8.6 8.1  HGB 12.2* 11.7*  HCT 36.8* 35.4*  PLT 130* 150   BMET Recent Labs    09/25/23 0637 09/26/23 0357  NA 141 141  K 3.2* 3.2*  CL 104 105  CO2 25 24  GLUCOSE 149* 149*  BUN 18 19  CREATININE 0.94 0.96  CALCIUM 7.9* 7.8*   INR  Date Value Ref Range Status  07/17/2022 1.2 0.8 - 1.2 Final    Comment:    (NOTE) INR goal varies based on device and disease states. Performed at [facility], [address], [place], [place] [address]      Exam  Left knee demonstrates well-healed anterior midline incision.  Moderate effusion.  Diffusely tender to palpation.  Knee aspirate growing gram-positive cocci.  Impression:  Infected left prosthetic knee.  Plan Plan is for left knee open exploration and debridement with poly exchange this coming Monday 10/7.  N.p.o. after midnight Sunday 10/6.   Mathew Simpson M Klara Stjames 09/26/2023, 10:10 AM   Contact # [phone]

## 2023-09-26 NOTE — Progress Notes (Signed)
PROGRESS NOTE    Mathew Simpson  HQI:[id number] DOB: 01-Jan-1936 DOA: 09/08/2023 PCP: Mathew Rud, PA-C    Brief Narrative:   Mathew Simpson is a 87 y.o. male with past medical history of  persistent atrial fibrillation on Eliquis, HTN, type 2 diabetes mellitus, vertigo  presented to the [facility] ED on 09/08/23 via EMS from home after mechanical fall at home, might have fallen backwards with loss of consciousness.  Found by neighbor.  Patient was brought in by EMS and was having dizziness nausea vomiting.  Patient was on Eliquis as outpatient.  In the ED, patient had stable vitals.  Labs showed creatinine of 1.3.  Alcohol level less than 10.  Procalcitonin less than 0.10.  CK 75.  Chest x-ray with low lung volumes.  Pelvis x-ray negative.  CT head and C-spine without contrast with 3 mm subdural hematoma anterior falx, no midline shift, 6 mm hemorrhagic contusion posterior left frontal lobe, asymmetric widening of the space between the dens and left lateral mass of the C1 relative the right lateral mass. Patient was seen by neurosurgery. Follow-up CT scans demonstrated stable appearance small anterior falcine subdural and left frontal contusion does not require any acute intervention. Dr. Conchita Paris; with neurosurgery recommended  holding Eliquis for 2 weeks, no requirement of cervical immobilization and outpatient follow-up in neurosurg clinic as needed.    Assessment & Plan:   Subdural hematoma Traumatic brain injury Left frontal contusion  Scalp laceration Mechanical fall  Neurosurgery followed the patient during hospitalization.  Conservative treatment at this time.  Recommendation of holding Eliquis for 2 weeks, no requirement of cervical immobilization and outpatient follow-up in neurosurg clinic as needed.  Staples removed on 09/18/2023.  CT head 09/20/23 with interval decrease in subdural hematoma. Continue to hold Eliquis x 2 weeks  Left knee pain, effusion secondary to MSSA septic arthritis.   Status post arthrocentesis by orthopedics on 09/23/2023 with cultures showing methicillin sensitive Staph aureus.  On cefazolin.  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  Plan for irrigation and debridement of the knee with poly exchange as per orthopedics on 09/28/2023.  Continue to hold Eliquis.  MSSA Bacteremia  Urinalysis negative.  Chest x-ray without any infiltrate.    CT scan of the chest abdomen pelvis without any acute findings.  TTE with LVEF 55-60%, RV systolic function mildly decreased, biatrial enlargement,  no notation of valvular vegetation although technically difficult study.  ID was consulted.  Blood cultures from 09/20/2023 with MSSA, repeat blood cultures from 09/22/2023 negative in 4 days.  Procalcitonin elevated at 5.5 but trending down. MRSA PCR negative.  Left knee aspiration with MSSA.  Currently on Cefazolin 2 g IV every 8 hours.  Continue supportive care.  Plan for irrigation and debridement on 09/28/2023.  Persistent atrial fibrillation with sinus pauses Seen by cardiology during hospitalization.  2D echocardiogram with LVEF 65 percent, normal RV function, mild biatrial enlargement, mild AI, dilated aortic root measuring 43 mm.  No indication for permanent pacemaker placement.  Cardizem dose has been decreased to 120 mg daily..  Recommended Zio patch for 2 weeks on discharge.  Rate controlled at this time without any significant pauses.  Essential hypertension Continue diltiazem and Irbesartan.  Blood pressure seems to slightly elevated today.  Vertigo -- Continue PT  Type 2 diabetes mellitus Latest Hemoglobin A1c 6.7.  Continue sliding scale insulin.  Glycemic control adequate.  Moderate protein calorie malnutrition Body mass index is 27.98 kg/m. Etiology: social / environmental  circumstances Signs/Symptoms: mild fat depletion, mild muscle depletion Interventions: Ensure Enlive (each supplement provides 350kcal and 20 grams of protein), MVI Seen  by dietitian.  Continue nutritional supplementation.  Hypokalemia.  Potassium 3.2.  Will replace.  Check levels in AM.  Hospital-acquired delirium Delirium precautions, Continue melatonin and Seroquel  Weakness/debility/deconditioning/gait disturbance: PT/OT initially recommendied SNF placement, denied by insurance carrier following peer to peer. --TOC following, family appeal for insurance denial now approved, has authorization through 10/5   DVT prophylaxis: enoxaparin (LOVENOX) injection 40 mg Start: 09/24/23 1415 SCDs Start: 09/08/23 2101    Code Status: Full Code  Family Communication:  Spoke with the patient's daughter Ms. [name]  on 09/24/2023. Marland Kitchen Disposition Plan:  Level of care: Telemetry Medical Status is: Inpatient  Remains inpatient appropriate because: MSSA bacteremia, left knee joint septic arthritis needing joint wash, pending SNF placement   Consultants:  Neurosurgery, Dr. Conchita Paris Cardiology Infectious disease Orthopedics  Procedures:  TTE Arthrocentesis 09-23-23  Antimicrobials:  Cefazolin 9/30>>   Subjective: Today, patient was seen and examined at bedside.  Appears to be more alert awake.  Hard of hearing.  Complains of pain over the knee especially on moving.  Has mild nausea.  No shortness of breath cough or chest pain.   Objective: Vitals:   09/25/23 2101 09/26/23 0028 09/26/23 0406 09/26/23 0751  BP: (!) 160/81 (!) 152/88 (!) 150/73 (!) 162/74  Pulse: 74 62 77 90  Resp: 19 20 19 17   Temp: 97.9 F (36.6 C) 98.1 F (36.7 C) 97.8 F (36.6 C) 98.2 F (36.8 C)  TempSrc: Axillary Axillary Oral Oral  SpO2: 100% 95% 99% 97%  Weight:      Height:        Intake/Output Summary (Last 24 hours) at 09/26/2023 1031 Last data filed at 09/26/2023 6578 Gross per 24 hour  Intake 420 ml  Output 2400 ml  Net -1980 ml   Filed Weights   09/08/23 1601  Weight: 88.5 kg    Physical examination:  General: Average built, chronically ill, elderly  male, hard of hearing, HENT:   No scleral pallor or icterus noted. Oral mucosa is moist.   Chest:   Diminished breath sounds bilaterally. No crackles or wheezes.  CVS: S1 &S2 heard. No murmur.  Regular rate and rhythm. Abdomen: Soft, nontender, nondistended.  Bowel sounds are heard.   Extremities: No cyanosis, clubbing.  Peripheral pulses are palpable.  Left knee  tenderness with effusion, restricted knees with movement.Marland Kitchen   Psych: Alert awake and Communicative, oriented to place and month. CNS:  No cranial nerve deficits.  Restricted left knee movement. Skin: Warm and dry.  Scalp laceration.  Data Reviewed: I have personally reviewed the following labs and imaging studies.     CBC: Recent Labs  Lab 09/20/23 2255 09/22/23 0511 09/23/23 0503 09/25/23 0637 09/26/23 0357  WBC 6.4 6.9 6.9 8.6 8.1  HGB 13.9 12.7* 11.6* 12.2* 11.7*  HCT 41.3 37.9* 35.5* 36.8* 35.4*  MCV 90.2 92.2 90.3 90.9 90.5  PLT 138* 101* 98* 130* 150   Basic Metabolic Panel: Recent Labs  Lab 09/20/23 1202 09/22/23 0511 09/23/23 0503 09/25/23 0637 09/26/23 0357  NA 142 147* 141 141 141  K 3.4* 3.6 3.2* 3.2* 3.2*  CL 110 118* 113* 104 105  CO2 22 19* 19* 25 24  GLUCOSE 215* 127* 163* 149* 149*  BUN 31* 31* 27* 18 19  CREATININE 1.28* 1.33* 1.05 0.94 0.96  CALCIUM 8.2* 8.0* 7.9* 7.9* 7.8*  MG 1.9 2.2  --  2.2 2.1  PHOS  --  2.6  --   --   --    GFR: Estimated Creatinine Clearance: 63 mL/min (by C-G formula based on SCr of 0.96 mg/dL). Liver Function Tests: Recent Labs  Lab 09/22/23 0511  AST 53*  ALT 52*  ALKPHOS 70  BILITOT 0.9  PROT 5.6*  ALBUMIN 2.3*   No results for input(s): "LIPASE", "AMYLASE" in the last 168 hours. No results for input(s): "AMMONIA" in the last 168 hours. Coagulation Profile: No results for input(s): "INR", "PROTIME" in the last 168 hours. Cardiac Enzymes: No results for input(s): "CKTOTAL", "CKMB", "CKMBINDEX", "TROPONINI" in the last 168 hours. BNP (last 3  results) No results for input(s): "PROBNP" in the last 8760 hours. HbA1C: No results for input(s): "HGBA1C" in the last 72 hours. CBG: Recent Labs  Lab 09/25/23 0615 09/25/23 1140 09/25/23 1615 09/25/23 2100 09/26/23 0603  GLUCAP 150* 253* 153* 141* 150*   Lipid Profile: No results for input(s): "CHOL", "HDL", "LDLCALC", "TRIG", "CHOLHDL", "LDLDIRECT" in the last 72 hours. Thyroid Function Tests: No results for input(s): "TSH", "T4TOTAL", "FREET4", "T3FREE", "THYROIDAB" in the last 72 hours.  Anemia Panel: No results for input(s): "VITAMINB12", "FOLATE", "FERRITIN", "TIBC", "IRON", "RETICCTPCT" in the last 72 hours. Sepsis Labs: Recent Labs  Lab 09/20/23 2255 09/21/23 0241 09/22/23 0511 09/22/23 0620 09/23/23 0503  PROCALCITON 8.44  --   --  8.09 5.05  LATICACIDVEN 2.7* 1.5 1.4  --   --     Recent Results (from the past 240 hour(s))  Urine Culture (for pregnant, neutropenic or urologic patients or patients with an indwelling urinary catheter)     Status: Abnormal   Collection Time: 09/20/23  9:47 PM   Specimen: Urine, Clean Catch  Result Value Ref Range Status   Specimen Description URINE, CLEAN CATCH  Final   Special Requests   Final    NONE Performed at [facility], [address], [place], [place] [address]    Culture 60,000 COLONIES/mL STAPHYLOCOCCUS AUREUS (A)  Final   Report Status 09/23/2023 FINAL  Final   Organism ID, Bacteria STAPHYLOCOCCUS AUREUS (A)  Final      Susceptibility   Staphylococcus aureus - MIC*    CIPROFLOXACIN <=0.5 SENSITIVE Sensitive     GENTAMICIN <=0.5 SENSITIVE Sensitive     NITROFURANTOIN <=16 SENSITIVE Sensitive     OXACILLIN <=0.25 SENSITIVE Sensitive     TETRACYCLINE <=1 SENSITIVE Sensitive     VANCOMYCIN <=0.5 SENSITIVE Sensitive     TRIMETH/SULFA <=10 SENSITIVE Sensitive     CLINDAMYCIN <=0.25 SENSITIVE Sensitive     RIFAMPIN <=0.5 SENSITIVE Sensitive     Inducible Clindamycin NEGATIVE Sensitive     LINEZOLID 2  SENSITIVE Sensitive     * 60,000 COLONIES/mL STAPHYLOCOCCUS AUREUS  Culture, blood (Routine X 2) w Reflex to ID Panel     Status: Abnormal   Collection Time: 09/20/23 10:54 PM   Specimen: BLOOD RIGHT ARM  Result Value Ref Range Status   Specimen Description BLOOD RIGHT ARM  Final   Special Requests   Final    BOTTLES DRAWN AEROBIC AND ANAEROBIC Blood Culture adequate volume   Culture  Setup Time   Final    GRAM POSITIVE COCCI IN CLUSTERS IN BOTH AEROBIC AND ANAEROBIC BOTTLES CRITICAL VALUE NOTED.  VALUE IS CONSISTENT WITH PREVIOUSLY REPORTED AND CALLED VALUE.    Culture (A)  Final    STAPHYLOCOCCUS AUREUS SUSCEPTIBILITIES PERFORMED ON PREVIOUS CULTURE WITHIN THE LAST 5 DAYS. Performed at [facility], [address], [place], [place] [address]    Report Status 09/23/2023 FINAL  Final  Culture, blood (Routine X 2) w Reflex to ID Panel     Status: Abnormal   Collection Time: 09/20/23 10:56 PM   Specimen: BLOOD RIGHT HAND  Result Value Ref Range Status   Specimen Description BLOOD RIGHT HAND  Final   Special Requests   Final    BOTTLES DRAWN AEROBIC AND ANAEROBIC Blood Culture adequate volume   Culture  Setup Time   Final    GRAM POSITIVE COCCI IN CLUSTERS IN BOTH AEROBIC AND ANAEROBIC BOTTLES CRITICAL RESULT CALLED TO, READ BACK BY AND VERIFIED WITHSalina April PHARMD, AT 1144 09/21/23 Performed at [facility], [address] [place], [place] [address]    Culture STAPHYLOCOCCUS AUREUS (A)  Final   Report Status 09/23/2023 FINAL  Final   Organism ID, Bacteria STAPHYLOCOCCUS AUREUS  Final      Susceptibility   Staphylococcus aureus - MIC*    CIPROFLOXACIN <=0.5 SENSITIVE Sensitive     ERYTHROMYCIN <=0.25 SENSITIVE Sensitive     GENTAMICIN <=0.5 SENSITIVE Sensitive     OXACILLIN 0.5 SENSITIVE Sensitive     TETRACYCLINE <=1 SENSITIVE Sensitive     VANCOMYCIN <=0.5 SENSITIVE Sensitive     TRIMETH/SULFA <=10 SENSITIVE Sensitive     CLINDAMYCIN <=0.25 SENSITIVE  Sensitive     RIFAMPIN <=0.5 SENSITIVE Sensitive     Inducible Clindamycin NEGATIVE Sensitive     LINEZOLID 2 SENSITIVE Sensitive     * STAPHYLOCOCCUS AUREUS  Blood Culture ID Panel (Reflexed)     Status: Abnormal   Collection Time: 09/20/23 10:56 PM  Result Value Ref Range Status   Enterococcus faecalis NOT DETECTED NOT DETECTED Final   Enterococcus Faecium NOT DETECTED NOT DETECTED Final   Listeria monocytogenes NOT DETECTED NOT DETECTED Final   Staphylococcus species DETECTED (A) NOT DETECTED Final    Comment: CRITICAL RESULT CALLED TO, READ BACK BY AND VERIFIED WITH: A. UTOMWEN PHARMD, AT 1144 09/21/23    Staphylococcus aureus (BCID) DETECTED (A) NOT DETECTED Final    Comment: CRITICAL RESULT CALLED TO, READ BACK BY AND VERIFIED WITH: A. UTOMWEN PHARMD, AT 1144 09/21/23    Staphylococcus epidermidis NOT DETECTED NOT DETECTED Final   Staphylococcus lugdunensis NOT DETECTED NOT DETECTED Final   Streptococcus species NOT DETECTED NOT DETECTED Final   Streptococcus agalactiae NOT DETECTED NOT DETECTED Final   Streptococcus pneumoniae NOT DETECTED NOT DETECTED Final   Streptococcus pyogenes NOT DETECTED NOT DETECTED Final   A.calcoaceticus-baumannii NOT DETECTED NOT DETECTED Final   Bacteroides fragilis NOT DETECTED NOT DETECTED Final   Enterobacterales NOT DETECTED NOT DETECTED Final   Enterobacter cloacae complex NOT DETECTED NOT DETECTED Final   Escherichia coli NOT DETECTED NOT DETECTED Final   Klebsiella aerogenes NOT DETECTED NOT DETECTED Final   Klebsiella oxytoca NOT DETECTED NOT DETECTED Final   Klebsiella pneumoniae NOT DETECTED NOT DETECTED Final   Proteus species NOT DETECTED NOT DETECTED Final   Salmonella species NOT DETECTED NOT DETECTED Final   Serratia marcescens NOT DETECTED NOT DETECTED Final   Haemophilus influenzae NOT DETECTED NOT DETECTED Final   Neisseria meningitidis NOT DETECTED NOT DETECTED Final   Pseudomonas aeruginosa NOT DETECTED NOT DETECTED Final    Stenotrophomonas maltophilia NOT DETECTED NOT DETECTED Final   Candida albicans NOT DETECTED NOT DETECTED Final   Candida auris NOT DETECTED NOT DETECTED Final   Candida glabrata NOT DETECTED NOT DETECTED Final   Candida  krusei NOT DETECTED NOT DETECTED Final   Candida parapsilosis NOT DETECTED NOT DETECTED Final   Candida tropicalis NOT DETECTED NOT DETECTED Final   Cryptococcus neoformans/gattii NOT DETECTED NOT DETECTED Final   Meth resistant mecA/C and MREJ NOT DETECTED NOT DETECTED Final    Comment: Performed at [facility], [address], [place], [place] [address]  MRSA Next Gen by PCR, Nasal     Status: None   Collection Time: 09/21/23  7:12 AM   Specimen: Nasal Mucosa; Nasal Swab  Result Value Ref Range Status   MRSA by PCR Next Gen NOT DETECTED NOT DETECTED Final    Comment: (NOTE) The GeneXpert MRSA Assay (FDA approved for NASAL specimens only), is one component of a comprehensive MRSA colonization surveillance program. It is not intended to diagnose MRSA infection nor to guide or monitor treatment for MRSA infections. Test performance is not FDA approved in patients less than 57 years old. Performed at [facility], [address] [place], [place] [address]   Culture, blood (Routine X 2) w Reflex to ID Panel     Status: None (Preliminary result)   Collection Time: 09/22/23  6:20 AM   Specimen: BLOOD RIGHT HAND  Result Value Ref Range Status   Specimen Description BLOOD RIGHT HAND  Final   Special Requests   Final    BOTTLES DRAWN AEROBIC AND ANAEROBIC Blood Culture results may not be optimal due to an excessive volume of blood received in culture bottles   Culture   Final    NO GROWTH 4 DAYS Performed at [facility], [address] [place], [place] [address]    Report Status PENDING  Incomplete  Culture, blood (Routine X 2) w Reflex to ID Panel     Status: None (Preliminary result)   Collection Time: 09/22/23  6:25 AM   Specimen: BLOOD LEFT  HAND  Result Value Ref Range Status   Specimen Description BLOOD LEFT HAND  Final   Special Requests   Final    BOTTLES DRAWN AEROBIC AND ANAEROBIC Blood Culture results may not be optimal due to an excessive volume of blood received in culture bottles   Culture   Final    NO GROWTH 4 DAYS Performed at [facility], [address], [place], [place] [address]    Report Status PENDING  Incomplete  Body fluid culture w Gram Stain     Status: None   Collection Time: 09/23/23  2:20 PM   Specimen: Synovium; Body Fluid  Result Value Ref Range Status   Specimen Description SYNOVIAL LEFT KNEE  Final   Special Requests NONE  Final   Gram Stain   Final    ABUNDANT WBC PRESENT, PREDOMINANTLY PMN FEW GRAM POSITIVE COCCI IN PAIRS CRITICAL RESULT CALLED TO, READ BACK BY AND VERIFIED WITH: RN ALEXIS MORGAN ON 09/23/23 @ 1909 BY DRT Performed at [facility], [address] [place], [place] [address]    Culture MODERATE STAPHYLOCOCCUS AUREUS  Final   Report Status 09/25/2023 FINAL  Final   Organism ID, Bacteria STAPHYLOCOCCUS AUREUS  Final      Susceptibility   Staphylococcus aureus - MIC*    CIPROFLOXACIN <=0.5 SENSITIVE Sensitive     ERYTHROMYCIN <=0.25 SENSITIVE Sensitive     GENTAMICIN <=0.5 SENSITIVE Sensitive     OXACILLIN 0.5 SENSITIVE Sensitive     TETRACYCLINE <=1 SENSITIVE Sensitive     VANCOMYCIN <=0.5 SENSITIVE Sensitive     TRIMETH/SULFA <=  10 SENSITIVE Sensitive     CLINDAMYCIN <=0.25 SENSITIVE Sensitive     RIFAMPIN <=0.5 SENSITIVE Sensitive     Inducible Clindamycin NEGATIVE Sensitive     LINEZOLID 2 SENSITIVE Sensitive     * MODERATE STAPHYLOCOCCUS AUREUS      Radiology Studies: No results found.   Scheduled Meds:  diltiazem  120 mg Oral Daily   enoxaparin (LOVENOX) injection  40 mg Subcutaneous Q1200   feeding supplement  1 Container Oral TID BM   insulin aspart  0-5 Units Subcutaneous QHS   insulin aspart  0-9 Units Subcutaneous TID WC   irbesartan   300 mg Oral Daily   melatonin  6 mg Oral QHS   metoprolol tartrate  12.5 mg Oral BID   multivitamin with minerals  1 tablet Oral Daily   polyethylene glycol  17 g Oral BID   QUEtiapine  12.5 mg Oral QHS   Continuous Infusions:   ceFAZolin (ANCEF) IV 2 g (09/26/23 0554)      LOS: 9 days    Joycelyn Das, MD [facility] Available via Epic secure chat 7am-7pm After these hours, please refer to coverage provider listed on [email or web address] 09/26/2023, 10:31 AM

## 2023-09-26 NOTE — Plan of Care (Signed)
  Problem: Coping: Goal: Ability to adjust to condition or change in health will improve Outcome: Progressing   Problem: Fluid Volume: Goal: Ability to maintain a balanced intake and output will improve Outcome: Progressing   Problem: Metabolic: Goal: Ability to maintain appropriate glucose levels will improve Outcome: Progressing   Problem: Nutritional: Goal: Maintenance of adequate nutrition will improve Outcome: Progressing   Problem: Skin Integrity: Goal: Risk for impaired skin integrity will decrease Outcome: Progressing   Problem: Tissue Perfusion: Goal: Adequacy of tissue perfusion will improve Outcome: Progressing   Problem: Nutrition: Goal: Adequate nutrition will be maintained Outcome: Progressing   Problem: Coping: Goal: Level of anxiety will decrease Outcome: Progressing

## 2023-09-26 NOTE — Plan of Care (Signed)
  Problem: Education: Goal: Ability to describe self-care measures that may prevent or decrease complications (Diabetes Survival Skills Education) will improve Outcome: Progressing Goal: Individualized Educational Video(s) Outcome: Progressing   

## 2023-09-27 DIAGNOSIS — M25462 Effusion, left knee: Secondary | ICD-10-CM

## 2023-09-27 DIAGNOSIS — E119 Type 2 diabetes mellitus without complications: Secondary | ICD-10-CM | POA: Diagnosis not present

## 2023-09-27 DIAGNOSIS — S06309D Unspecified focal traumatic brain injury with loss of consciousness of unspecified duration, subsequent encounter: Secondary | ICD-10-CM | POA: Diagnosis not present

## 2023-09-27 DIAGNOSIS — R001 Bradycardia, unspecified: Secondary | ICD-10-CM | POA: Diagnosis not present

## 2023-09-27 DIAGNOSIS — I482 Chronic atrial fibrillation, unspecified: Secondary | ICD-10-CM | POA: Diagnosis not present

## 2023-09-27 LAB — CULTURE, BLOOD (ROUTINE X 2)
Culture: NO GROWTH
Culture: NO GROWTH

## 2023-09-27 LAB — GLUCOSE, CAPILLARY
Glucose-Capillary: 144 mg/dL — ABNORMAL HIGH (ref 70–99)
Glucose-Capillary: 143 mg/dL — ABNORMAL HIGH (ref 70–99)
Glucose-Capillary: 156 mg/dL — ABNORMAL HIGH (ref 70–99)
Glucose-Capillary: 120 mg/dL — ABNORMAL HIGH (ref 70–99)

## 2023-09-27 LAB — CBC
HCT: 33.9 % — ABNORMAL LOW (ref 39.0–52.0)
Hemoglobin: 11.5 g/dL — ABNORMAL LOW (ref 13.0–17.0)
MCH: 31.2 pg (ref 26.0–34.0)
MCHC: 33.9 g/dL (ref 30.0–36.0)
MCV: 91.9 fL (ref 80.0–100.0)
Platelets: 181 10*3/uL (ref 150–400)
RBC: 3.69 MIL/uL — ABNORMAL LOW (ref 4.22–5.81)
RDW: 14.2 % (ref 11.5–15.5)
WBC: 9.4 10*3/uL (ref 4.0–10.5)
nRBC: 0 % (ref 0.0–0.2)

## 2023-09-27 LAB — BASIC METABOLIC PANEL
Anion gap: 16 — ABNORMAL HIGH (ref 5–15)
BUN: 16 mg/dL (ref 8–23)
CO2: 25 mmol/L (ref 22–32)
Calcium: 7.9 mg/dL — ABNORMAL LOW (ref 8.9–10.3)
Chloride: 100 mmol/L (ref 98–111)
Creatinine, Ser: 0.9 mg/dL (ref 0.61–1.24)
GFR, Estimated: >60 mL/min (ref >60)
Glucose, Bld: 145 mg/dL — ABNORMAL HIGH (ref 70–99)
Potassium: 3.8 mmol/L (ref 3.5–5.1)
Sodium: 141 mmol/L (ref 135–145)

## 2023-09-27 MED ORDER — LACTATED RINGERS IV SOLN: INTRAVENOUS | Status: DC

## 2023-09-27 NOTE — Plan of Care (Signed)

## 2023-09-27 NOTE — Progress Notes (Signed)
PROGRESS NOTE    BERNHARD SPEAK  QMV:[id number] DOB: 1936/03/22 DOA: 09/08/2023 PCP: [name], PA-C    Brief Narrative:   Mathew Simpson is a 87 y.o. male with past medical history of  persistent atrial fibrillation on Eliquis, HTN, type 2 diabetes mellitus, vertigo  presented to the [facility] ED on 09/08/23 via EMS from home after mechanical fall at home, might have fallen backwards with loss of consciousness.  Found by neighbor.  Patient was brought in by EMS and was having dizziness nausea vomiting.  Patient was on Eliquis as outpatient.  In the ED, patient had stable vitals.  Labs showed creatinine of 1.3.  Alcohol level less than 10.  Procalcitonin less than 0.10.  CK 75.  Chest x-ray with low lung volumes.  Pelvis x-ray negative.  CT head and C-spine without contrast with 3 mm subdural hematoma anterior falx, no midline shift, 6 mm hemorrhagic contusion posterior left frontal lobe, asymmetric widening of the space between the dens and left lateral mass of the C1 relative the right lateral mass. Patient was seen by neurosurgery. Follow-up CT scans demonstrated stable appearance small anterior falcine subdural and left frontal contusion does not require any acute intervention. Dr. Conchita Paris; with neurosurgery recommended  holding Eliquis for 2 weeks, no requirement of cervical immobilization and outpatient follow-up in neurosurg clinic as needed.    Assessment & Plan:   Subdural hematoma Traumatic brain injury Left frontal contusion  Scalp laceration Mechanical fall  Neurosurgery followed the patient during hospitalization.  Patient is undergoing conservative treatment at this time.  Recommendation of holding Eliquis for 2 weeks, no requirement of cervical immobilization and outpatient follow-up in neurosurg clinic as needed.  Staples removed on 09/18/2023.  CT head 09/20/23 with interval decrease in subdural hematoma. Continue to hold Eliquis x 2 weeks  Left knee pain, effusion secondary to  MSSA septic arthritis.  Status post arthrocentesis by orthopedics on 09/23/2023 with cultures showing methicillin sensitive Staph aureus.  On cefazolin.  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  Plan for irrigation and debridement of the knee with poly exchange as per orthopedics on 09/28/2023.  Continue to hold Eliquis.  Keep n.p.o. after midnight.  MSSA Bacteremia  Urinalysis negative.  Chest x-ray without any infiltrate.    CT scan of the chest abdomen pelvis without any acute findings.  TTE with LVEF 55-60%, RV systolic function mildly decreased, biatrial enlargement,  no notation of valvular vegetation although technically difficult study.  ID was consulted.  Blood cultures from 09/20/2023 with MSSA, repeat blood cultures from 09/22/2023 negative in 4 days.  Procalcitonin elevated at 5.5 but trending down. MRSA PCR negative.  Left knee aspiration with MSSA.  Currently on Cefazolin 2 g IV every 8 hours.  Continue supportive care.  Plan for irrigation and debridement on 09/28/2023.  Persistent atrial fibrillation with sinus pauses Seen by cardiology during hospitalization.  2D echocardiogram with LVEF 65 percent, normal RV function, mild biatrial enlargement, mild AI, dilated aortic root measuring 43 mm.  No indication for permanent pacemaker placement.  Cardizem dose has been decreased to 120 mg daily..  Recommended Zio patch for 2 weeks on discharge.  Rate controlled at this time without any significant pauses.  Essential hypertension Continue diltiazem and Irbesartan.  Blood pressure seems to slightly elevated today.  Vertigo -- Continue PT  Type 2 diabetes mellitus Latest Hemoglobin A1c 6.7.  Continue sliding scale insulin.  Glycemic control adequate.  Moderate protein calorie malnutrition Body mass  index is 27.98 kg/m. Etiology: social / environmental circumstances Signs/Symptoms: mild fat depletion, mild muscle depletion Interventions: Ensure Enlive (each supplement  provides 350kcal and 20 grams of protein), MVI Seen by dietitian.  Continue nutritional supplementation.  Hypokalemia.  Improved after replacement.  Latest potassium of 3.8  Hospital-acquired delirium Delirium precautions, Continue melatonin and Seroquel.  Appears to be stable  Weakness/debility/deconditioning/gait disturbance: PT/OT initially recommendied SNF placement, denied by insurance carrier following peer to peer. --TOC following, family appeal for insurance denial now approved, has authorization through 10/5   DVT prophylaxis: enoxaparin (LOVENOX) injection 40 mg Start: 09/24/23 1415 SCDs Start: 09/08/23 2101    Code Status: Full Code  Family Communication:  Spoke with the patient's daughter Ms. [name]  on 09/24/2023. Marland Kitchen Disposition Plan:  Level of care: Telemetry Medical Status is: Inpatient  Remains inpatient appropriate because: MSSA bacteremia, left knee joint septic arthritis needing joint wash, pending SNF placement   Consultants:  Neurosurgery, Dr. Conchita Paris Cardiology Infectious disease Orthopedics  Procedures:  TTE Arthrocentesis 09-23-23  Antimicrobials:  Cefazolin 9/30>>   Subjective: Today, patient was seen and examined at bedside.  Complains of mild knee and back pain.  Denies any fever chills nausea vomiting shortness of breath chest pain.   Objective: Vitals:   09/26/23 2039 09/27/23 0019 09/27/23 0436 09/27/23 0806  BP: (!) 149/78 (!) 150/72 135/84 (!) 148/81  Pulse: 88 81 88 89  Resp: 18 (!) 22 (!) 22 18  Temp: 98.4 F (36.9 C) 98.2 F (36.8 C) (!) 97.5 F (36.4 C) 97.9 F (36.6 C)  TempSrc: Oral Oral Oral Oral  SpO2: 95% 94% 97% 98%  Weight:      Height:        Intake/Output Summary (Last 24 hours) at 09/27/2023 1017 Last data filed at 09/27/2023 0805 Gross per 24 hour  Intake --  Output 2450 ml  Net -2450 ml   Filed Weights   09/08/23 1601  Weight: 88.5 kg    Physical examination:  General: Average built,  chronically ill, elderly male, hard of hearing, Communicative HENT:   No scleral pallor or icterus noted. Oral mucosa is moist.   Chest:   Diminished breath sounds bilaterally. No crackles or wheezes.  CVS: S1 &S2 heard. No murmur.  Regular rate and rhythm. Abdomen: Soft, nontender, nondistended.  Bowel sounds are heard.   Extremities: No cyanosis, clubbing.  Peripheral pulses are palpable.  Left knee scar with mild effusion and tenderness on palpation.   Psych: Alert awake and Communicative, oriented to place and month. CNS:  No cranial nerve deficits.  Restricted left knee movement. Skin: Warm and dry.    Data Reviewed: I have personally reviewed the following labs and imaging studies.     CBC: Recent Labs  Lab 09/22/23 0511 09/23/23 0503 09/25/23 0637 09/26/23 0357 09/27/23 0550  WBC 6.9 6.9 8.6 8.1 9.4  HGB 12.7* 11.6* 12.2* 11.7* 11.5*  HCT 37.9* 35.5* 36.8* 35.4* 33.9*  MCV 92.2 90.3 90.9 90.5 91.9  PLT 101* 98* 130* 150 181   Basic Metabolic Panel: Recent Labs  Lab 09/20/23 1202 09/22/23 0511 09/23/23 0503 09/25/23 0637 09/26/23 0357 09/27/23 0550  NA 142 147* 141 141 141 141  K 3.4* 3.6 3.2* 3.2* 3.2* 3.8  CL 110 118* 113* 104 105 100  CO2 22 19* 19* 25 24 25   GLUCOSE 215* 127* 163* 149* 149* 145*  BUN 31* 31* 27* 18 19 16   CREATININE 1.28* 1.33* 1.05 0.94 0.96 0.90  CALCIUM 8.2* 8.0*  7.9* 7.9* 7.8* 7.9*  MG 1.9 2.2  --  2.2 2.1  --   PHOS  --  2.6  --   --   --   --    GFR: Estimated Creatinine Clearance: 67.2 mL/min (by C-G formula based on SCr of 0.9 mg/dL). Liver Function Tests: Recent Labs  Lab 09/22/23 0511  AST 53*  ALT 52*  ALKPHOS 70  BILITOT 0.9  PROT 5.6*  ALBUMIN 2.3*   No results for input(s): "LIPASE", "AMYLASE" in the last 168 hours. No results for input(s): "AMMONIA" in the last 168 hours. Coagulation Profile: No results for input(s): "INR", "PROTIME" in the last 168 hours. Cardiac Enzymes: No results for input(s): "CKTOTAL",  "CKMB", "CKMBINDEX", "TROPONINI" in the last 168 hours. BNP (last 3 results) No results for input(s): "PROBNP" in the last 8760 hours. HbA1C: No results for input(s): "HGBA1C" in the last 72 hours. CBG: Recent Labs  Lab 09/26/23 0603 09/26/23 1146 09/26/23 1619 09/26/23 2114 09/27/23 0616  GLUCAP 150* 154* 138* 137* 144*   Lipid Profile: No results for input(s): "CHOL", "HDL", "LDLCALC", "TRIG", "CHOLHDL", "LDLDIRECT" in the last 72 hours. Thyroid Function Tests: No results for input(s): "TSH", "T4TOTAL", "FREET4", "T3FREE", "THYROIDAB" in the last 72 hours.  Anemia Panel: No results for input(s): "VITAMINB12", "FOLATE", "FERRITIN", "TIBC", "IRON", "RETICCTPCT" in the last 72 hours. Sepsis Labs: Recent Labs  Lab 09/20/23 2255 09/21/23 0241 09/22/23 0511 09/22/23 0620 09/23/23 0503  PROCALCITON 8.44  --   --  8.09 5.05  LATICACIDVEN 2.7* 1.5 1.4  --   --     Recent Results (from the past 240 hour(s))  Urine Culture (for pregnant, neutropenic or urologic patients or patients with an indwelling urinary catheter)     Status: Abnormal   Collection Time: 09/20/23  9:47 PM   Specimen: Urine, Clean Catch  Result Value Ref Range Status   Specimen Description URINE, CLEAN CATCH  Final   Special Requests   Final    NONE Performed at [facility], [address] [place], [place] [address]    Culture 60,000 COLONIES/mL STAPHYLOCOCCUS AUREUS (A)  Final   Report Status 09/23/2023 FINAL  Final   Organism ID, Bacteria STAPHYLOCOCCUS AUREUS (A)  Final      Susceptibility   Staphylococcus aureus - MIC*    CIPROFLOXACIN <=0.5 SENSITIVE Sensitive     GENTAMICIN <=0.5 SENSITIVE Sensitive     NITROFURANTOIN <=16 SENSITIVE Sensitive     OXACILLIN <=0.25 SENSITIVE Sensitive     TETRACYCLINE <=1 SENSITIVE Sensitive     VANCOMYCIN <=0.5 SENSITIVE Sensitive     TRIMETH/SULFA <=10 SENSITIVE Sensitive     CLINDAMYCIN <=0.25 SENSITIVE Sensitive     RIFAMPIN <=0.5 SENSITIVE Sensitive      Inducible Clindamycin NEGATIVE Sensitive     LINEZOLID 2 SENSITIVE Sensitive     * 60,000 COLONIES/mL STAPHYLOCOCCUS AUREUS  Culture, blood (Routine X 2) w Reflex to ID Panel     Status: Abnormal   Collection Time: 09/20/23 10:54 PM   Specimen: BLOOD RIGHT ARM  Result Value Ref Range Status   Specimen Description BLOOD RIGHT ARM  Final   Special Requests   Final    BOTTLES DRAWN AEROBIC AND ANAEROBIC Blood Culture adequate volume   Culture  Setup Time   Final    GRAM POSITIVE COCCI IN CLUSTERS IN BOTH AEROBIC AND ANAEROBIC BOTTLES CRITICAL VALUE NOTED.  VALUE IS CONSISTENT WITH PREVIOUSLY REPORTED AND CALLED VALUE.    Culture (A)  Final  STAPHYLOCOCCUS AUREUS SUSCEPTIBILITIES PERFORMED ON PREVIOUS CULTURE WITHIN THE LAST 5 DAYS. Performed at [facility], [address], [place], [place] [address]    Report Status 09/23/2023 FINAL  Final  Culture, blood (Routine X 2) w Reflex to ID Panel     Status: Abnormal   Collection Time: 09/20/23 10:56 PM   Specimen: BLOOD RIGHT HAND  Result Value Ref Range Status   Specimen Description BLOOD RIGHT HAND  Final   Special Requests   Final    BOTTLES DRAWN AEROBIC AND ANAEROBIC Blood Culture adequate volume   Culture  Setup Time   Final    GRAM POSITIVE COCCI IN CLUSTERS IN BOTH AEROBIC AND ANAEROBIC BOTTLES CRITICAL RESULT CALLED TO, READ BACK BY AND VERIFIED WITHSalina April PHARMD, AT 1144 09/21/23 Performed at [facility], [address], [place], [place] [address]    Culture STAPHYLOCOCCUS AUREUS (A)  Final   Report Status 09/23/2023 FINAL  Final   Organism ID, Bacteria STAPHYLOCOCCUS AUREUS  Final      Susceptibility   Staphylococcus aureus - MIC*    CIPROFLOXACIN <=0.5 SENSITIVE Sensitive     ERYTHROMYCIN <=0.25 SENSITIVE Sensitive     GENTAMICIN <=0.5 SENSITIVE Sensitive     OXACILLIN 0.5 SENSITIVE Sensitive     TETRACYCLINE <=1 SENSITIVE Sensitive     VANCOMYCIN <=0.5 SENSITIVE Sensitive     TRIMETH/SULFA  <=10 SENSITIVE Sensitive     CLINDAMYCIN <=0.25 SENSITIVE Sensitive     RIFAMPIN <=0.5 SENSITIVE Sensitive     Inducible Clindamycin NEGATIVE Sensitive     LINEZOLID 2 SENSITIVE Sensitive     * STAPHYLOCOCCUS AUREUS  Blood Culture ID Panel (Reflexed)     Status: Abnormal   Collection Time: 09/20/23 10:56 PM  Result Value Ref Range Status   Enterococcus faecalis NOT DETECTED NOT DETECTED Final   Enterococcus Faecium NOT DETECTED NOT DETECTED Final   Listeria monocytogenes NOT DETECTED NOT DETECTED Final   Staphylococcus species DETECTED (A) NOT DETECTED Final    Comment: CRITICAL RESULT CALLED TO, READ BACK BY AND VERIFIED WITH: A. UTOMWEN PHARMD, AT 1144 09/21/23    Staphylococcus aureus (BCID) DETECTED (A) NOT DETECTED Final    Comment: CRITICAL RESULT CALLED TO, READ BACK BY AND VERIFIED WITH: A. UTOMWEN PHARMD, AT 1144 09/21/23    Staphylococcus epidermidis NOT DETECTED NOT DETECTED Final   Staphylococcus lugdunensis NOT DETECTED NOT DETECTED Final   Streptococcus species NOT DETECTED NOT DETECTED Final   Streptococcus agalactiae NOT DETECTED NOT DETECTED Final   Streptococcus pneumoniae NOT DETECTED NOT DETECTED Final   Streptococcus pyogenes NOT DETECTED NOT DETECTED Final   A.calcoaceticus-baumannii NOT DETECTED NOT DETECTED Final   Bacteroides fragilis NOT DETECTED NOT DETECTED Final   Enterobacterales NOT DETECTED NOT DETECTED Final   Enterobacter cloacae complex NOT DETECTED NOT DETECTED Final   Escherichia coli NOT DETECTED NOT DETECTED Final   Klebsiella aerogenes NOT DETECTED NOT DETECTED Final   Klebsiella oxytoca NOT DETECTED NOT DETECTED Final   Klebsiella pneumoniae NOT DETECTED NOT DETECTED Final   Proteus species NOT DETECTED NOT DETECTED Final   Salmonella species NOT DETECTED NOT DETECTED Final   Serratia marcescens NOT DETECTED NOT DETECTED Final   Haemophilus influenzae NOT DETECTED NOT DETECTED Final   Neisseria meningitidis NOT DETECTED NOT DETECTED Final    Pseudomonas aeruginosa NOT DETECTED NOT DETECTED Final   Stenotrophomonas maltophilia NOT DETECTED NOT DETECTED Final   Candida albicans NOT DETECTED NOT DETECTED Final   Candida auris NOT DETECTED  NOT DETECTED Final   Candida glabrata NOT DETECTED NOT DETECTED Final   Candida krusei NOT DETECTED NOT DETECTED Final   Candida parapsilosis NOT DETECTED NOT DETECTED Final   Candida tropicalis NOT DETECTED NOT DETECTED Final   Cryptococcus neoformans/gattii NOT DETECTED NOT DETECTED Final   Meth resistant mecA/C and MREJ NOT DETECTED NOT DETECTED Final    Comment: Performed at [facility], [address] [place], [place] [address]  MRSA Next Gen by PCR, Nasal     Status: None   Collection Time: 09/21/23  7:12 AM   Specimen: Nasal Mucosa; Nasal Swab  Result Value Ref Range Status   MRSA by PCR Next Gen NOT DETECTED NOT DETECTED Final    Comment: (NOTE) The GeneXpert MRSA Assay (FDA approved for NASAL specimens only), is one component of a comprehensive MRSA colonization surveillance program. It is not intended to diagnose MRSA infection nor to guide or monitor treatment for MRSA infections. Test performance is not FDA approved in patients less than 65 years old. Performed at [facility], [address], [place], [place] [address]   Culture, blood (Routine X 2) w Reflex to ID Panel     Status: None   Collection Time: 09/22/23  6:20 AM   Specimen: BLOOD RIGHT HAND  Result Value Ref Range Status   Specimen Description BLOOD RIGHT HAND  Final   Special Requests   Final    BOTTLES DRAWN AEROBIC AND ANAEROBIC Blood Culture results may not be optimal due to an excessive volume of blood received in culture bottles   Culture   Final    NO GROWTH 5 DAYS Performed at [facility], [address] [place], [place] [address]    Report Status 09/27/2023 FINAL  Final  Culture, blood (Routine X 2) w Reflex to ID Panel     Status: None   Collection Time: 09/22/23  6:25 AM    Specimen: BLOOD LEFT HAND  Result Value Ref Range Status   Specimen Description BLOOD LEFT HAND  Final   Special Requests   Final    BOTTLES DRAWN AEROBIC AND ANAEROBIC Blood Culture results may not be optimal due to an excessive volume of blood received in culture bottles   Culture   Final    NO GROWTH 5 DAYS Performed at [facility], [address], [place], [place] [address]    Report Status 09/27/2023 FINAL  Final  Body fluid culture w Gram Stain     Status: None   Collection Time: 09/23/23  2:20 PM   Specimen: Synovium; Body Fluid  Result Value Ref Range Status   Specimen Description SYNOVIAL LEFT KNEE  Final   Special Requests NONE  Final   Gram Stain   Final    ABUNDANT WBC PRESENT, PREDOMINANTLY PMN FEW GRAM POSITIVE COCCI IN PAIRS CRITICAL RESULT CALLED TO, READ BACK BY AND VERIFIED WITH: RN ALEXIS MORGAN ON 09/23/23 @ 1909 BY DRT Performed at [facility], [address] [place], [place] [address]    Culture MODERATE STAPHYLOCOCCUS AUREUS  Final   Report Status 09/25/2023 FINAL  Final   Organism ID, Bacteria STAPHYLOCOCCUS AUREUS  Final      Susceptibility   Staphylococcus aureus - MIC*    CIPROFLOXACIN <=0.5 SENSITIVE Sensitive     ERYTHROMYCIN <=0.25 SENSITIVE Sensitive     GENTAMICIN <=0.5 SENSITIVE Sensitive     OXACILLIN 0.5 SENSITIVE Sensitive     TETRACYCLINE <=1 SENSITIVE Sensitive  VANCOMYCIN <=0.5 SENSITIVE Sensitive     TRIMETH/SULFA <=10 SENSITIVE Sensitive     CLINDAMYCIN <=0.25 SENSITIVE Sensitive     RIFAMPIN <=0.5 SENSITIVE Sensitive     Inducible Clindamycin NEGATIVE Sensitive     LINEZOLID 2 SENSITIVE Sensitive     * MODERATE STAPHYLOCOCCUS AUREUS      Radiology Studies: No results found.   Scheduled Meds:  diltiazem  120 mg Oral Daily   enoxaparin (LOVENOX) injection  40 mg Subcutaneous Q1200   feeding supplement  1 Container Oral TID BM   insulin aspart  0-5 Units Subcutaneous QHS   insulin aspart  0-9 Units Subcutaneous  TID WC   irbesartan  300 mg Oral Daily   liver oil-zinc oxide   Topical Daily   melatonin  6 mg Oral QHS   metoprolol tartrate  12.5 mg Oral BID   multivitamin with minerals  1 tablet Oral Daily   polyethylene glycol  17 g Oral BID   potassium chloride  40 mEq Oral BID   QUEtiapine  12.5 mg Oral QHS   Continuous Infusions:   ceFAZolin (ANCEF) IV 2 g (09/27/23 0532)      LOS: 10 days    Joycelyn Das, MD [facility] Available via Epic secure chat 7am-7pm After these hours, please refer to coverage provider listed on [email or web address] 09/27/2023, 10:17 AM

## 2023-09-27 NOTE — Progress Notes (Signed)
Attempted to get patient out of bed to bed side commode with two assist. Pt was unable to stand with support.

## 2023-09-27 NOTE — Plan of Care (Signed)
  Problem: Nutritional: Goal: Maintenance of adequate nutrition will improve Outcome: Progressing   Problem: Skin Integrity: Goal: Risk for impaired skin integrity will decrease Outcome: Progressing   Problem: Clinical Measurements: Goal: Will remain free from infection Outcome: Progressing Goal: Cardiovascular complication will be avoided Outcome: Progressing   Problem: Nutritional: Goal: Maintenance of adequate nutrition will improve Outcome: Progressing   Problem: Skin Integrity: Goal: Risk for impaired skin integrity will decrease Outcome: Progressing   Problem: Clinical Measurements: Goal: Will remain free from infection Outcome: Progressing Goal: Cardiovascular complication will be avoided Outcome: Progressing

## 2023-09-28 ENCOUNTER — Other Ambulatory Visit: Payer: Self-pay

## 2023-09-28 ENCOUNTER — Inpatient Hospital Stay (HOSPITAL_COMMUNITY): Payer: Medicare HMO | Admitting: Anesthesiology

## 2023-09-28 ENCOUNTER — Encounter (HOSPITAL_COMMUNITY)
Admission: EM | Disposition: A | Discharge status: Skilled Nursing Facility | Payer: Self-pay | Source: Home / Self Care | Attending: Family Medicine

## 2023-09-28 ENCOUNTER — Encounter (HOSPITAL_COMMUNITY): Payer: Self-pay | Admitting: Internal Medicine

## 2023-09-28 DIAGNOSIS — E119 Type 2 diabetes mellitus without complications: Secondary | ICD-10-CM | POA: Diagnosis not present

## 2023-09-28 DIAGNOSIS — R001 Bradycardia, unspecified: Secondary | ICD-10-CM | POA: Diagnosis not present

## 2023-09-28 DIAGNOSIS — I482 Chronic atrial fibrillation, unspecified: Secondary | ICD-10-CM | POA: Diagnosis not present

## 2023-09-28 DIAGNOSIS — T8454XA Infection and inflammatory reaction due to internal left knee prosthesis, initial encounter: Secondary | ICD-10-CM | POA: Diagnosis not present

## 2023-09-28 DIAGNOSIS — I4891 Unspecified atrial fibrillation: Secondary | ICD-10-CM

## 2023-09-28 DIAGNOSIS — S06309D Unspecified focal traumatic brain injury with loss of consciousness of unspecified duration, subsequent encounter: Secondary | ICD-10-CM | POA: Diagnosis not present

## 2023-09-28 HISTORY — PX: I & D KNEE WITH POLY EXCHANGE: SHX5024

## 2023-09-28 LAB — GLUCOSE, CAPILLARY
Glucose-Capillary: 151 mg/dL — ABNORMAL HIGH (ref 70–99)
Glucose-Capillary: 138 mg/dL — ABNORMAL HIGH (ref 70–99)
Glucose-Capillary: 132 mg/dL — ABNORMAL HIGH (ref 70–99)
Glucose-Capillary: 146 mg/dL — ABNORMAL HIGH (ref 70–99)
Glucose-Capillary: 167 mg/dL — ABNORMAL HIGH (ref 70–99)

## 2023-09-28 SURGERY — IRRIGATION AND DEBRIDEMENT KNEE WITH POLY EXCHANGE
Anesthesia: General | Site: Knee | Laterality: Left

## 2023-09-28 MED ORDER — OXYCODONE HCL 5 MG PO TABS: 5.0000 mg | ORAL_TABLET | Freq: Once | ORAL | Status: DC | PRN

## 2023-09-28 MED ORDER — METOCLOPRAMIDE HCL 5 MG/ML IJ SOLN: 5.0000 mg | Freq: Three times a day (TID) | INTRAMUSCULAR | Status: DC | PRN

## 2023-09-28 MED ORDER — METHOCARBAMOL 500 MG PO TABS
500.0000 mg | ORAL_TABLET | Freq: Four times a day (QID) | ORAL | Status: DC | PRN
  Administered 2023-09-30: 500 mg via ORAL
  Filled 2023-09-28 (×3): qty 1

## 2023-09-28 MED ORDER — ACETAMINOPHEN 500 MG PO TABS
1000.0000 mg | ORAL_TABLET | Freq: Four times a day (QID) | ORAL | Status: AC
  Administered 2023-09-28 – 2023-09-29 (×2): 1000 mg via ORAL
  Filled 2023-09-28 (×4): qty 2

## 2023-09-28 MED ORDER — FLEET ENEMA RE ENEM: 1.0000 | ENEMA | Freq: Once | RECTAL | Status: DC | PRN

## 2023-09-28 MED ORDER — ONDANSETRON HCL 4 MG/2ML IJ SOLN: 4.0000 mg | Freq: Four times a day (QID) | INTRAMUSCULAR | Status: DC | PRN

## 2023-09-28 MED ORDER — ORAL CARE MOUTH RINSE: 15.0000 mL | Freq: Once | OROMUCOSAL | Status: AC

## 2023-09-28 MED ORDER — PROPOFOL 10 MG/ML IV BOLUS
INTRAVENOUS | Status: DC | PRN
  Administered 2023-09-28: 100 mg via INTRAVENOUS

## 2023-09-28 MED ORDER — SODIUM CHLORIDE 0.9 % IV SOLN: INTRAVENOUS | Status: DC

## 2023-09-28 MED ORDER — PHENYLEPHRINE 80 MCG/ML (10ML) SYRINGE FOR IV PUSH (FOR BLOOD PRESSURE SUPPORT)
PREFILLED_SYRINGE | INTRAVENOUS | Status: DC | PRN
  Administered 2023-09-28: 80 ug via INTRAVENOUS
  Administered 2023-09-28: 160 ug via INTRAVENOUS
  Administered 2023-09-28: 80 ug via INTRAVENOUS

## 2023-09-28 MED ORDER — ONDANSETRON HCL 4 MG/2ML IJ SOLN
INTRAMUSCULAR | Status: AC
  Filled 2023-09-28: qty 2

## 2023-09-28 MED ORDER — FENTANYL CITRATE (PF) 250 MCG/5ML IJ SOLN
INTRAMUSCULAR | Status: AC
  Filled 2023-09-28: qty 5

## 2023-09-28 MED ORDER — PHENOL 1.4 % MT LIQD: 1.0000 | OROMUCOSAL | Status: DC | PRN

## 2023-09-28 MED ORDER — BISACODYL 10 MG RE SUPP: 10.0000 mg | Freq: Every day | RECTAL | Status: DC | PRN

## 2023-09-28 MED ORDER — FENTANYL CITRATE (PF) 100 MCG/2ML IJ SOLN
INTRAMUSCULAR | Status: AC
  Filled 2023-09-28: qty 2

## 2023-09-28 MED ORDER — METHOCARBAMOL 1000 MG/10ML IJ SOLN
500.0000 mg | Freq: Four times a day (QID) | INTRAVENOUS | Status: DC | PRN
  Administered 2023-09-28 – 2023-09-29 (×2): 500 mg via INTRAVENOUS
  Filled 2023-09-28: qty 5
  Filled 2023-09-28: qty 500

## 2023-09-28 MED ORDER — ONDANSETRON HCL 4 MG PO TABS: 4.0000 mg | ORAL_TABLET | Freq: Four times a day (QID) | ORAL | Status: DC | PRN

## 2023-09-28 MED ORDER — DOCUSATE SODIUM 100 MG PO CAPS
100.0000 mg | ORAL_CAPSULE | Freq: Two times a day (BID) | ORAL | Status: DC
  Administered 2023-09-28 – 2023-10-01 (×6): 100 mg via ORAL
  Filled 2023-09-28 (×6): qty 1

## 2023-09-28 MED ORDER — OXYCODONE HCL 5 MG/5ML PO SOLN: 5.0000 mg | Freq: Once | ORAL | Status: DC | PRN

## 2023-09-28 MED ORDER — ACETAMINOPHEN 500 MG PO TABS: 1000.0000 mg | ORAL_TABLET | Freq: Once | ORAL | Status: DC

## 2023-09-28 MED ORDER — METOCLOPRAMIDE HCL 10 MG PO TABS: 5.0000 mg | ORAL_TABLET | Freq: Three times a day (TID) | ORAL | Status: DC | PRN

## 2023-09-28 MED ORDER — CHLORHEXIDINE GLUCONATE 0.12 % MT SOLN
OROMUCOSAL | Status: AC
  Filled 2023-09-28: qty 15

## 2023-09-28 MED ORDER — MENTHOL 3 MG MT LOZG: 1.0000 | LOZENGE | OROMUCOSAL | Status: DC | PRN

## 2023-09-28 MED ORDER — DEXAMETHASONE SODIUM PHOSPHATE 10 MG/ML IJ SOLN: 8.0000 mg | Freq: Once | INTRAMUSCULAR | Status: DC

## 2023-09-28 MED ORDER — POVIDONE-IODINE 10 % EX SWAB
2.0000 | Freq: Once | CUTANEOUS | Status: AC
  Administered 2023-09-28: 2 via TOPICAL

## 2023-09-28 MED ORDER — PROPOFOL 10 MG/ML IV BOLUS
INTRAVENOUS | Status: AC
  Filled 2023-09-28: qty 20

## 2023-09-28 MED ORDER — DROPERIDOL 2.5 MG/ML IJ SOLN: 0.6250 mg | Freq: Once | INTRAMUSCULAR | Status: DC | PRN

## 2023-09-28 MED ORDER — CEFAZOLIN SODIUM-DEXTROSE 2-4 GM/100ML-% IV SOLN
2.0000 g | Freq: Three times a day (TID) | INTRAVENOUS | Status: DC
  Administered 2023-09-28 – 2023-09-29 (×3): 2 g via INTRAVENOUS
  Filled 2023-09-28: qty 100

## 2023-09-28 MED ORDER — CEFAZOLIN SODIUM-DEXTROSE 2-4 GM/100ML-% IV SOLN
2.0000 g | INTRAVENOUS | Status: AC
  Administered 2023-09-28: 2 g via INTRAVENOUS

## 2023-09-28 MED ORDER — 0.9 % SODIUM CHLORIDE (POUR BTL) OPTIME
TOPICAL | Status: DC | PRN
Start: 2023-09-28 — End: 2023-09-28
  Administered 2023-09-28: 1000 mL

## 2023-09-28 MED ORDER — LACTATED RINGERS IV SOLN: INTRAVENOUS | Status: DC

## 2023-09-28 MED ORDER — FENTANYL CITRATE (PF) 250 MCG/5ML IJ SOLN
INTRAMUSCULAR | Status: DC | PRN
  Administered 2023-09-28 (×3): 50 ug via INTRAVENOUS

## 2023-09-28 MED ORDER — METOPROLOL TARTRATE 5 MG/5ML IV SOLN
INTRAVENOUS | Status: AC
  Filled 2023-09-28: qty 5

## 2023-09-28 MED ORDER — MORPHINE SULFATE (PF) 2 MG/ML IV SOLN
1.0000 mg | INTRAVENOUS | Status: DC | PRN
  Administered 2023-09-28 – 2023-09-29 (×2): 2 mg via INTRAVENOUS
  Filled 2023-09-28 (×2): qty 1

## 2023-09-28 MED ORDER — DIPHENHYDRAMINE HCL 12.5 MG/5ML PO ELIX: 12.5000 mg | ORAL_SOLUTION | ORAL | Status: DC | PRN

## 2023-09-28 MED ORDER — LIDOCAINE 2% (20 MG/ML) 5 ML SYRINGE
INTRAMUSCULAR | Status: DC | PRN
  Administered 2023-09-28: 50 mg via INTRAVENOUS

## 2023-09-28 MED ORDER — ONDANSETRON HCL 4 MG/2ML IJ SOLN
INTRAMUSCULAR | Status: DC | PRN
  Administered 2023-09-28: 4 mg via INTRAVENOUS

## 2023-09-28 MED ORDER — OXYCODONE HCL 5 MG PO TABS
10.0000 mg | ORAL_TABLET | ORAL | Status: DC | PRN
  Administered 2023-09-28: 10 mg via ORAL
  Filled 2023-09-28 (×3): qty 2

## 2023-09-28 MED ORDER — GERHARDT'S BUTT CREAM
TOPICAL_CREAM | Freq: Three times a day (TID) | CUTANEOUS | Status: DC
  Administered 2023-09-30 – 2023-10-01 (×2): 1 via TOPICAL
  Filled 2023-09-28 (×2): qty 1

## 2023-09-28 MED ORDER — SODIUM CHLORIDE 0.9 % IR SOLN
Status: DC | PRN
  Administered 2023-09-28: 3000 mL

## 2023-09-28 MED ORDER — APIXABAN 2.5 MG PO TABS
2.5000 mg | ORAL_TABLET | Freq: Two times a day (BID) | ORAL | Status: AC
  Administered 2023-09-29 – 2023-09-30 (×4): 2.5 mg via ORAL
  Filled 2023-09-28 (×4): qty 1

## 2023-09-28 MED ORDER — OXYCODONE HCL 5 MG PO TABS
5.0000 mg | ORAL_TABLET | ORAL | Status: DC | PRN
  Administered 2023-09-29: 10 mg via ORAL
  Filled 2023-09-28: qty 2

## 2023-09-28 MED ORDER — FENTANYL CITRATE (PF) 100 MCG/2ML IJ SOLN
25.0000 ug | INTRAMUSCULAR | Status: DC | PRN
  Administered 2023-09-28 (×2): 25 ug via INTRAVENOUS

## 2023-09-28 MED ORDER — CHLORHEXIDINE GLUCONATE 0.12 % MT SOLN
15.0000 mL | Freq: Once | OROMUCOSAL | Status: AC
  Administered 2023-09-28: 15 mL via OROMUCOSAL

## 2023-09-28 MED ORDER — POLYETHYLENE GLYCOL 3350 17 G PO PACK: 17.0000 g | PACK | Freq: Every day | ORAL | Status: DC | PRN

## 2023-09-28 MED ORDER — METOPROLOL TARTRATE 5 MG/5ML IV SOLN
3.0000 mg | Freq: Once | INTRAVENOUS | Status: AC
  Administered 2023-09-28: 3 mg via INTRAVENOUS

## 2023-09-28 MED ORDER — BUPIVACAINE LIPOSOME 1.3 % IJ SUSP: 20.0000 mL | Freq: Once | INTRAMUSCULAR | Status: DC

## 2023-09-28 MED ORDER — LIDOCAINE 2% (20 MG/ML) 5 ML SYRINGE
INTRAMUSCULAR | Status: AC
  Filled 2023-09-28: qty 5

## 2023-09-28 MED ORDER — PHENYLEPHRINE 80 MCG/ML (10ML) SYRINGE FOR IV PUSH (FOR BLOOD PRESSURE SUPPORT)
PREFILLED_SYRINGE | INTRAVENOUS | Status: AC
  Filled 2023-09-28: qty 10

## 2023-09-28 SURGICAL SUPPLY — 71 items
ADH SKN CLS APL DERMABOND .7 (GAUZE/BANDAGES/DRESSINGS) ×1
ATTUNE PSRP INSR SZ8 7 KNEE (Insert) IMPLANT
BAG COUNTER SPONGE SURGICOUNT (BAG) ×1 IMPLANT
BAG DECANTER FOR FLEXI CONT (MISCELLANEOUS) ×1 IMPLANT
BAG SPNG CNTER NS LX DISP (BAG)
BANDAGE ESMARK 6X9 LF (GAUZE/BANDAGES/DRESSINGS) ×1 IMPLANT
BLADE CLIPPER SURG (BLADE) IMPLANT
BLADE SAW SAG 90X13X1.27 (BLADE) IMPLANT
BNDG CMPR 6 X 5 YARDS HK CLSR (GAUZE/BANDAGES/DRESSINGS) ×1
BNDG CMPR 9X6 STRL LF SNTH (GAUZE/BANDAGES/DRESSINGS) ×1
BNDG ELASTIC 6INX 5YD STR LF (GAUZE/BANDAGES/DRESSINGS) IMPLANT
BNDG ESMARK 6X9 LF (GAUZE/BANDAGES/DRESSINGS) ×1
BOWL SMART MIX CTS (DISPOSABLE) IMPLANT
COVER BACK TABLE 24X17X13 BIG (DRAPES) IMPLANT
COVER SURGICAL LIGHT HANDLE (MISCELLANEOUS) ×1 IMPLANT
CUFF TOURN SGL QUICK 34 (TOURNIQUET CUFF) ×1
CUFF TOURN SGL QUICK 42 (TOURNIQUET CUFF) IMPLANT
CUFF TRNQT CYL 34X4.125X (TOURNIQUET CUFF) IMPLANT
DERMABOND ADVANCED .7 DNX12 (GAUZE/BANDAGES/DRESSINGS) IMPLANT
DRAPE HALF SHEET 40X57 (DRAPES) ×1 IMPLANT
DRAPE IMP U-DRAPE 54X76 (DRAPES) ×1 IMPLANT
DRAPE INCISE IOBAN 66X45 STRL (DRAPES) IMPLANT
DRILL BIT 6.4MMX7INL DISPOSE (BIT) IMPLANT
DRSG ADAPTIC 3X8 NADH LF (GAUZE/BANDAGES/DRESSINGS) ×1 IMPLANT
DRSG AQUACEL AG ADV 3.5X10 (GAUZE/BANDAGES/DRESSINGS) IMPLANT
DURAPREP 26ML APPLICATOR (WOUND CARE) ×2 IMPLANT
ELECT REM PT RETURN 9FT ADLT (ELECTROSURGICAL) ×1
ELECTRODE REM PT RTRN 9FT ADLT (ELECTROSURGICAL) ×1 IMPLANT
EVACUATOR 1/8 PVC DRAIN (DRAIN) IMPLANT
FACESHIELD WRAPAROUND (MASK) ×2 IMPLANT
FACESHIELD WRAPAROUND OR TEAM (MASK) ×2 IMPLANT
GAUZE PAD ABD 8X10 STRL (GAUZE/BANDAGES/DRESSINGS) ×1 IMPLANT
GAUZE SPONGE 4X4 12PLY STRL (GAUZE/BANDAGES/DRESSINGS) ×1 IMPLANT
GLOVE BIO SURGEON STRL SZ8 (GLOVE) ×1 IMPLANT
GLOVE BIOGEL M 8.0 STRL (GLOVE) ×1 IMPLANT
GLOVE BIOGEL PI IND STRL 8 (GLOVE) ×1 IMPLANT
GOWN STRL REUS W/ TWL LRG LVL3 (GOWN DISPOSABLE) ×2 IMPLANT
GOWN STRL REUS W/ TWL XL LVL3 (GOWN DISPOSABLE) ×2 IMPLANT
GOWN STRL REUS W/TWL LRG LVL3 (GOWN DISPOSABLE) ×2
GOWN STRL REUS W/TWL XL LVL3 (GOWN DISPOSABLE)
HANDPIECE INTERPULSE COAX TIP (DISPOSABLE) ×1
KIT BASIN OR (CUSTOM PROCEDURE TRAY) ×1 IMPLANT
KIT TURNOVER KIT B (KITS) ×1 IMPLANT
MANIFOLD NEPTUNE II (INSTRUMENTS) ×1 IMPLANT
MARKER SPHERE PSV REFLC THRD 5 (MARKER) ×3 IMPLANT
NS IRRIG 1000ML POUR BTL (IV SOLUTION) ×1 IMPLANT
PACK TOTAL JOINT (CUSTOM PROCEDURE TRAY) ×1 IMPLANT
PACK UNIVERSAL I (CUSTOM PROCEDURE TRAY) ×1 IMPLANT
PAD ARMBOARD 7.5X6 YLW CONV (MISCELLANEOUS) ×2 IMPLANT
PAD CAST 4YDX4 CTTN HI CHSV (CAST SUPPLIES) ×1 IMPLANT
PADDING CAST COTTON 4X4 STRL (CAST SUPPLIES)
PADDING CAST COTTON 6X4 STRL (CAST SUPPLIES) IMPLANT
PIN SCHANZ 4MM 130MM (PIN) ×4 IMPLANT
SET HNDPC FAN SPRY TIP SCT (DISPOSABLE) IMPLANT
SOLUTION PRONTOSAN WOUND 350ML (IRRIGATION / IRRIGATOR) IMPLANT
STAPLER VISISTAT 35W (STAPLE) ×1 IMPLANT
STRIP CLOSURE SKIN 1/2X4 (GAUZE/BANDAGES/DRESSINGS) ×2 IMPLANT
SUCTION TUBE FRAZIER 10FR DISP (SUCTIONS) ×1 IMPLANT
SUT MNCRL AB 3-0 PS2 18 (SUTURE) ×1 IMPLANT
SUT MNCRL AB 4-0 PS2 18 (SUTURE) IMPLANT
SUT PDS AB 0 CT 36 (SUTURE) ×3 IMPLANT
SUT STRATAFIX 1PDS 45CM VIOLET (SUTURE) IMPLANT
SUT VIC AB 0 CT1 27 (SUTURE)
SUT VIC AB 0 CT1 27XBRD ANBCTR (SUTURE) ×2 IMPLANT
SUT VIC AB 2-0 CTB1 (SUTURE) ×2 IMPLANT
SWAB COLLECTION DEVICE MRSA (MISCELLANEOUS) IMPLANT
SWAB CULTURE ESWAB REG 1ML (MISCELLANEOUS) IMPLANT
TOWEL GREEN STERILE (TOWEL DISPOSABLE) ×1 IMPLANT
TOWEL GREEN STERILE FF (TOWEL DISPOSABLE) ×1 IMPLANT
TRAY FOLEY MTR SLVR 16FR STAT (SET/KITS/TRAYS/PACK) IMPLANT
WATER STERILE IRR 1000ML POUR (IV SOLUTION) ×3 IMPLANT

## 2023-09-28 NOTE — Anesthesia Postprocedure Evaluation (Signed)
Anesthesia Post Note  Patient: Mathew Simpson  Procedure(s) Performed: IRRIGATION AND DEBRIDEMENT KNEE WITH POLY EXCHANGE (Left: Knee)     Patient location during evaluation: PACU Anesthesia Type: General Level of consciousness: awake and alert Pain management: pain level controlled Vital Signs Assessment: post-procedure vital signs reviewed and stable Respiratory status: spontaneous breathing, nonlabored ventilation, respiratory function stable and patient connected to nasal cannula oxygen Cardiovascular status: blood pressure returned to baseline and stable Postop Assessment: no apparent nausea or vomiting Anesthetic complications: no   No notable events documented.  Last Vitals:  Vitals:   09/28/23 1930 09/28/23 2006  BP: (!) 156/87 (!) 163/93  Pulse: 90 (!) 106  Resp: 17 18  Temp: 36.4 C 36.7 C  SpO2: 96% 99%    Last Pain:  Vitals:   09/28/23 1810  TempSrc:   PainSc: Asleep                 Earl Lites P Mouhamed Glassco

## 2023-09-28 NOTE — Brief Op Note (Addendum)
09/08/2023 - 09/28/2023  5:39 PM  PATIENT:  Mathew Simpson  87 y.o. male  PRE-OPERATIVE DIAGNOSIS:  left total knee infection  POST-OPERATIVE DIAGNOSIS:  left total knee infection  PROCEDURE:  Procedure(s): IRRIGATION AND DEBRIDEMENT KNEE WITH POLY EXCHANGE (Left)  SURGEON:  Surgeons and Role:    Ollen Gross, MD - Primary  PHYSICIAN ASSISTANT:   ASSISTANTS: Stepohanie Eveland, PA-C   ANESTHESIA:   general  EBL:  25 ml  BLOOD ADMINISTERED:none  DRAINS: none   LOCAL MEDICATIONS USED:  NONE  COUNTS:  YES  TOURNIQUET:   Total Tourniquet Time Documented: Thigh (Left) - 32 minutes Total: Thigh (Left) - 32 minutes   DICTATION: .Other Dictation: Dictation Number [id number]  PLAN OF CARE: Admit to inpatient   PATIENT DISPOSITION:  PACU - hemodynamically stable.    OK to start anticoagulation tomorrow from our standpoint. Decision per primary team

## 2023-09-28 NOTE — Progress Notes (Signed)
Nutrition Follow-up  DOCUMENTATION CODES:   Non-severe (moderate) malnutrition in context of social or environmental circumstances  INTERVENTION:  Start calorie count on 10/8, or once diet advance Continue Boost Breeze po TID, each supplement provides 250 kcal and 9 grams of protein Encourage family to provide food from home that pt may enjoy Magic cup TID with meals, each supplement provides 290 kcal and 9 grams of protein Request updated measured weight MVI with minerals daily If po intake remains limited, recommend consideration of Cortrak placement for supplemental nutrition support Recommend increased bowel regimen given last BM 5 days ago  NUTRITION DIAGNOSIS:   Moderate Malnutrition related to social / environmental circumstances as evidenced by mild fat depletion, mild muscle depletion. - remains applicable  GOAL:   Patient will meet greater than or equal to 90% of their needs - goal unmet  MONITOR:   PO intake, Supplement acceptance, Labs, Weight trends  REASON FOR ASSESSMENT:   Consult Other (Comment) (nutrition goals)  ASSESSMENT:   Pt admitted after a mechanical fall leading to SDH. PMH significant for afib on eliquis, HTN.  Pt remains NPO today for I&D of  L knee.   Per review of chart, multiple meal completions undocumented. Unable to determine accurate picture of PO intake over the last week. Of the documented meals, pt has consumed very minimal intake 0-15% with a majority being 0% (09/27-10/4). Per discussion with RN and NT, pt has primarily been consuming 2L bottles of Diet Sundrop and fruit. Declining ONS.   Pt resting at time of visit. Spoke with his ex-wife Okey Regal at bedside. She and her husband, pt's best friend, have been visiting quite often throughout admission. She states that she has not seen him eat much except for fruit and some mac and cheese a couple days ago. Encouraged her to provide items from home that he may enjoy when coming to visit.    Medications: SSI 0-5 units at bedtime, SSI 0-9 units TID, melatonin, MVI, miralax, IV abx  Labs reviewed  CBG's 120-156 x24 horus  Diet Order:   Diet Order             Diet NPO time specified Except for: Sips with Meds  Diet effective now                   EDUCATION NEEDS:   No education needs have been identified at this time  Skin:  Skin Assessment: Reviewed RN Assessment (posterior head laceration) Per WOC: (MASD) to buttocks, groin and inner thighs. Blistering to buttocks.   Last BM:  10/2  Height:   Ht Readings from Last 1 Encounters:  09/08/23 5\' 10"  (1.778 m)    Weight:   Wt Readings from Last 1 Encounters:  09/08/23 88.5 kg    Ideal Body Weight:  75.5 kg  BMI:  Body mass index is 27.98 kg/m.  Estimated Nutritional Needs:   Kcal:  1900-2100  Protein:  95-110g  Fluid:  >/=2L  Drusilla Kanner, RDN, LDN Clinical Nutrition

## 2023-09-28 NOTE — Anesthesia Preprocedure Evaluation (Addendum)
Anesthesia Evaluation  Patient identified by MRN, date of birth, ID band Patient confused    Reviewed: Allergy & Precautions, NPO status , Patient's Chart, lab work & pertinent test results  History of Anesthesia Complications Negative for: history of anesthetic complications  Airway Mallampati: II  TM Distance: >3 FB Neck ROM: Full    Dental  (+) Dental Advisory Given, Edentulous Upper   Pulmonary former smoker   Pulmonary exam normal        Cardiovascular hypertension, Pt. on medications + dysrhythmias (on Eliquis) Atrial Fibrillation  Rhythm:Irregular Rate:Normal   '24 TTE - EF 55 to 60%. There is moderate concentric left ventricular hypertrophy. Right ventricular systolic function is mildly reduced. Left atrial size was severely dilated. Right atrial size was mildly dilated. Aortic valve regurgitation is trivial. There is moderate dilatation of the ascending aorta, measuring 45 mm.     Neuro/Psych  Hx SDH     GI/Hepatic negative GI ROS, Neg liver ROS,,,  Endo/Other  diabetes, Type 2    Renal/GU negative Renal ROS  negative genitourinary   Musculoskeletal  (+) Arthritis , Osteoarthritis,    Abdominal   Peds  Hematology  (+) Blood dyscrasia (Hgb 11.5), anemia  On eliquis    Anesthesia Other Findings   Reproductive/Obstetrics                             Anesthesia Physical Anesthesia Plan  ASA: 3  Anesthesia Plan: General   Post-op Pain Management: Tylenol PO (pre-op)*   Induction: Intravenous  PONV Risk Score and Plan: 2 and Treatment may vary due to age or medical condition, Ondansetron and Propofol infusion  Airway Management Planned: LMA  Additional Equipment: None  Intra-op Plan:   Post-operative Plan: Extubation in OR  Informed Consent: I have reviewed the patients History and Physical, chart, labs and discussed the procedure including the risks, benefits and  alternatives for the proposed anesthesia with the patient or authorized representative who has indicated his/her understanding and acceptance.     Dental advisory given  Plan Discussed with: CRNA and Anesthesiologist  Anesthesia Plan Comments:        Anesthesia Quick Evaluation

## 2023-09-28 NOTE — Progress Notes (Signed)
PT Cancellation Note  Patient Details Name: Mathew Simpson MRN: [id number] DOB: [birth year]   Cancelled Treatment:    Reason Eval/Treat Not Completed: Other (comment). Pt planned to go to OR for L knee I&D and poly exchange. Acute PT to return as able, as appropriate to reassess mobility.  Lewis Shock, PT, DPT Acute Rehabilitation Services Secure chat preferred Office #: [phone]    Iona Hansen 09/28/2023, 12:36 PM

## 2023-09-28 NOTE — Consult Note (Signed)
WOC Nurse Consult Note: Reason for Consult:moisture associated skin damage (MASD) to buttocks, groin and inner thighs.  Blistering to buttocks.  Wound type:MASD. Pressure Injury POA: NA Measurement: generalized erythema to perineal area,  Peeling epithelium noted on inner thighs and blistering to buttocks Wound bed: pink and moist Drainage (amount, consistency, odor) weeping Periwound: intact condom cath in place to contain urinary incontinence Dressing procedure/placement/frequency: Cleanse perineal skin with soap and water and pat dry.  Apply GErhardts paste three times daily and PRN soilage.   Will not follow at this time.  Please re-consult if needed.  Mike Gip MSN, RN, FNP-BC CWON Wound, Ostomy, Continence Nurse Outpatient [facility] [phone] Pager [phone]

## 2023-09-28 NOTE — Anesthesia Procedure Notes (Signed)
Procedure Name: LMA Insertion Date/Time: 09/28/2023 4:40 PM  Performed by: Georgianne Fick D, CRNAPre-anesthesia Checklist: Patient identified, Emergency Drugs available, Suction available and Patient being monitored Patient Re-evaluated:Patient Re-evaluated prior to induction Oxygen Delivery Method: Circle System Utilized Preoxygenation: Pre-oxygenation with 100% oxygen Induction Type: IV induction Ventilation: Mask ventilation without difficulty LMA: LMA inserted LMA Size: 5.0 Number of attempts: 1 Airway Equipment and Method: Bite block Placement Confirmation: positive ETCO2 Tube secured with: Tape Dental Injury: Teeth and Oropharynx as per pre-operative assessment

## 2023-09-28 NOTE — Progress Notes (Addendum)
[facility] Pre-op Review  Pre-op checklist: Not completed by RN at this time  NPO: as of 0800 10/7  Labs: CA 7.9 10/06--Not done on 10/7  Consent: Completed per Epic  H&P: 9/17 from Hospitalist.   Vitals: elevated temp  O2 requirements: RA  MAR/PTA review: Metoprolol given at 0958, Ancef q8 due at 1400  IV: 22G RFA & 20G LFA  Floor nurse name:  Balinda Quails, RN   Additional info:   Confused?  CBG.  Last dose Lovenox 10/06 1219  Tele Eliquis home dose PTA PCR neg 09/30

## 2023-09-28 NOTE — Interval H&P Note (Signed)
History and Physical Interval Note:  09/28/2023 4:16 PM  Mathew Simpson  has presented today for surgery, with the diagnosis of left total knee infection.  The various methods of treatment have been discussed with the patient and family. After consideration of risks, benefits and other options for treatment, the patient has consented to  Procedure(s): IRRIGATION AND DEBRIDEMENT KNEE WITH POLY EXCHANGE (Left) as a surgical intervention.  The patient's history has been reviewed, patient examined, no change in status, stable for surgery.  I have reviewed the patient's chart and labs.  Questions were answered to the patient's satisfaction.     Homero Fellers Myha Arizpe

## 2023-09-28 NOTE — Op Note (Unsigned)
NAMEJAYVEN, NAILL MEDICAL RECORD NO: [id number] ACCOUNT NO: [id number] DATE OF BIRTH: 1936/07/11 FACILITY: [facility] LOCATION: [id number] PHYSICIAN: Gus Rankin. Nilsa Macht, MD  Operative Report   PREOPERATIVE DIAGNOSIS:  Periprosthetic joint infection, left knee.  POSTOPERATIVE DIAGNOSIS:  Periprosthetic joint infection, left knee.  PROCEDURE:  Irrigation and debridement, left knee with polyethylene liner exchange.  SURGEON:  Gus Rankin. Lillyann Ahart, MD  ASSISTANT:  Weston Brass, PA-C.  ANESTHESIA:  General.  ESTIMATED BLOOD LOSS:  25 mL.  DRAINS:  None.  TOURNIQUET TIME:  32 minutes at 300 mmHg.  COMPLICATIONS:  None.  CONDITION:  Stable to recovery.  BRIEF CLINICAL NOTE:  The patient is an 87 year old male who had a left total knee arthroplasty done about 5 years ago and had been hospitalized for head injury recently and developed bacteremia.  Unfortunately, the bacteria seated his knee and he has an acute  periprosthetic joint infection.  He presents now for irrigation, debridement and polyethylene liner exchange.  DESCRIPTION OF PROCEDURE:  After successful administration of general anesthetic, a tourniquet was placed on his left thigh and his left lower extremity was prepped and draped in the usual sterile fashion.  Extremity was wrapped in Esmarch, tourniquet  inflated to 300 mmHg.  Midline incision was made with a 10 blade through the subcutaneous tissue to the level of the extensor mechanism.  A fresh blade was used to make a medial parapatellar arthrotomy.  Fluid was encountered in the joint and sent for  Gram stain, culture and sensitivity, aerobic and anaerobic cultures.  We then elevated the soft tissue on the proximal medial tibia to the joint line with a knife and into the semimembranosus bursa with a Cobb elevator.  The tissue was elevated laterally  with attention being paid to avoid the patellar tendon on tibial tubercle.  I then did a thorough synovectomy, removing any synovial  tissue or abnormal-appearing tissue.  This was done starting medially and going superiorly and then going laterally and  inferolaterally.  I was then able to evert the patella.  We flexed the knee 90 degrees.  We removed the tibial polyethylene, which was a 7 mm posterior stabilized rotating platform insert for a size 8 Attune femur.  We thoroughly irrigated alternating  between normal saline with pulsatile lavage and Prontosan.  We irrigated with a total of a liter of Prontosan and 3 liters of saline using pulsatile lavage.  I then placed the new polyethylene, which was a size 8 7 mm thickness posterior stabilized  rotating platform insert.  It was placed in tibial tray.  The knee was reduced with outstanding stability throughout full range of motion.  We further irrigated and then closed the arthrotomy with a running 0 Stratafix suture.  Tourniquet was then  released, total time of 32 minutes.  Subcutaneous was closed with interrupted 2-0 Vicryl and subcuticular running 4-0 Monocryl.  Incisions cleaned and dried and Dermabond and a sterile dressing applied.  He was then awakened and transported to recovery  in stable condition.  Note that a surgical assistant was of medical necessity for this procedure to do it in a safe and expeditious manner.  Surgical assistant was necessary for retraction of vital ligaments and neurovascular structures and for proper positioning of the limb  for safe removal of the old implant and safe and accurate placement of the new implant.   PUS D: 09/28/2023 5:44:18 pm T: 09/28/2023 8:04:00 pm  JOB: [id number]/ [id number]

## 2023-09-28 NOTE — Discharge Instructions (Signed)
Ollen Gross, MD [facility] [place] [address], [address] [place], [place] [address] [phone]  POSTOPERATIVE DIRECTIONS  Knee Rehabilitation, Guidelines Following Surgery  Results after knee surgery are often greatly improved when you follow the exercise, range of motion and muscle strengthening exercises prescribed by your doctor. Safety measures are also important to protect the knee from further injury. If any of these exercises cause you to have increased pain or swelling in your knee joint, decrease the amount until you are comfortable again and slowly increase them. If you have problems or questions, call your caregiver or physical therapist for advice.   HOME CARE INSTRUCTIONS  Remove items at home which could result in a fall. This includes throw rugs or furniture in walking pathways.  ICE to the affected knee as much as tolerated. Icing helps control swelling. If the swelling is well controlled you will be more comfortable and rehab easier. Continue to use ice on the knee for pain and swelling from surgery. You may notice swelling that will progress down to the foot and ankle. This is normal after surgery. Elevate the leg when you are not up walking on it.    Continue to use the breathing machine which will help keep your temperature down. It is common for your temperature to cycle up and down following surgery, especially at night when you are not up moving around and exerting yourself. The breathing machine keeps your lungs expanded and your temperature down. Do not place pillow under the operative knee, focus on keeping the knee straight while resting  DIET You may resume your previous home diet once you are discharged from the hospital.  DRESSING / WOUND CARE / SHOWERING Keep your bulky bandage on for 2 days. On the third post-operative day you may remove the Ace bandage and gauze. There is a waterproof adhesive bandage on your skin  which will stay in place until your first follow-up appointment. Once you remove this you will not need to place another bandage You may begin showering 3 days following surgery, but do not submerge the incision under water.  ACTIVITY For the first 5 days, the key is rest and control of pain and swelling Do your home exercises twice a day starting on post-operative day 3. On the days you go to physical therapy, just do the home exercises once that day. You should rest, ice and elevate the leg for 50 minutes out of every hour. Get up and walk/stretch for 10 minutes per hour. After 5 days you can increase your activity slowly as tolerated. Walk with your walker as instructed. Use the walker until you are comfortable transitioning to a cane. Walk with the cane in the opposite hand of the operative leg. You may discontinue the cane once you are comfortable and walking steadily. Avoid periods of inactivity such as sitting longer than an hour when not asleep. This helps prevent blood clots.  You may discontinue the knee immobilizer once you are able to perform a straight leg raise while lying down. You may resume a sexual relationship in one month or when given the OK by your doctor.  You may return to work once you are cleared by your doctor.  Do not drive a car for 6 weeks or until released by your surgeon.  Do not drive while taking narcotics.  TED HOSE STOCKINGS Wear the elastic stockings on both legs for three weeks following surgery during the day. You may remove them  at night for sleeping.  WEIGHT BEARING Weight bearing as tolerated with assist device (walker, cane, etc) as directed, use it as long as suggested by your surgeon or therapist, typically at least 4-6 weeks.  POSTOPERATIVE CONSTIPATION PROTOCOL Constipation - defined medically as fewer than three stools per week and severe constipation as less than one stool per week.  One of the most common issues patients have following surgery  is constipation.  Even if you have a regular bowel pattern at home, your normal regimen is likely to be disrupted due to multiple reasons following surgery.  Combination of anesthesia, postoperative narcotics, change in appetite and fluid intake all can affect your bowels.  In order to avoid complications following surgery, here are some recommendations in order to help you during your recovery period.  Colace (docusate) - Pick up an over-the-counter form of Colace or another stool softener and take twice a day as long as you are requiring postoperative pain medications.  Take with a full glass of water daily.  If you experience loose stools or diarrhea, hold the colace until you stool forms back up. If your symptoms do not get better within 1 week or if they get worse, check with your doctor. Dulcolax (bisacodyl) - Pick up over-the-counter and take as directed by the product packaging as needed to assist with the movement of your bowels.  Take with a full glass of water.  Use this product as needed if not relieved by Colace only.  MiraLax (polyethylene glycol) - Pick up over-the-counter to have on hand. MiraLax is a solution that will increase the amount of water in your bowels to assist with bowel movements.  Take as directed and can mix with a glass of water, juice, soda, coffee, or tea. Take if you go more than two days without a movement. Do not use MiraLax more than once per day. Call your doctor if you are still constipated or irregular after using this medication for 7 days in a row.  If you continue to have problems with postoperative constipation, please contact the office for further assistance and recommendations.  If you experience "the worst abdominal pain ever" or develop nausea or vomiting, please contact the office immediatly for further recommendations for treatment.  ITCHING If you experience itching with your medications, try taking only a single pain pill, or even half a pain pill at a  time.  You can also use Benadryl over the counter for itching or also to help with sleep.   MEDICATIONS See your medication summary on the "After Visit Summary" that the nursing staff will review with you prior to discharge.  You may have some home medications which will be placed on hold until you complete the course of blood thinner medication.  It is important for you to complete the blood thinner medication as prescribed by your surgeon.  Continue your approved medications as instructed at time of discharge.  PRECAUTIONS If you experience chest pain or shortness of breath - call 911 immediately for transfer to the hospital emergency department.  If you develop a fever greater that 101 F, purulent drainage from wound, increased redness or drainage from wound, foul odor from the wound/dressing, or calf pain - CONTACT YOUR SURGEON.  FOLLOW-UP APPOINTMENTS Make sure you keep all of your appointments after your operation with your surgeon and caregivers. You should call the office at the above phone number and make an appointment for approximately two weeks after the date of your surgery or on the date instructed by your surgeon outlined in the "After Visit Summary".  RANGE OF MOTION AND STRENGTHENING EXERCISES  Rehabilitation of the knee is important following a knee injury or an operation. After just a few days of immobilization, the muscles of the thigh which control the knee become weakened and shrink (atrophy). Knee exercises are designed to build up the tone and strength of the thigh muscles and to improve knee motion. Often times heat used for twenty to thirty minutes before working out will loosen up your tissues and help with improving the range of motion but do not use heat for the first two weeks following surgery. These exercises can be done on a training (exercise) mat, on the floor, on a table or on a bed. Use what ever works the best and is  most comfortable for you Knee exercises include:  Leg Lifts - While your knee is still immobilized in a splint or cast, you can do straight leg raises. Lift the leg to 60 degrees, hold for 3 sec, and slowly lower the leg. Repeat 10-20 times 2-3 times daily. Perform this exercise against resistance later as your knee gets better.  Quad and Hamstring Sets - Tighten up the muscle on the front of the thigh (Quad) and hold for 5-10 sec. Repeat this 10-20 times hourly. Hamstring sets are done by pushing the foot backward against an object and holding for 5-10 sec. Repeat as with quad sets.  Leg Slides: Lying on your back, slowly slide your foot toward your buttocks, bending your knee up off the floor (only go as far as is comfortable). Then slowly slide your foot back down until your leg is flat on the floor again. Angel Wings: Lying on your back spread your legs to the side as far apart as you can without causing discomfort.  A rehabilitation program following serious knee injuries can speed recovery and prevent re-injury in the future due to weakened muscles. Contact your doctor or a physical therapist for more information on knee rehabilitation.   POST-OPERATIVE OPIOID TAPER INSTRUCTIONS: It is important to wean off of your opioid medication as soon as possible. If you do not need pain medication after your surgery it is ok to stop day one. Opioids include: Codeine, Hydrocodone(Norco, Vicodin), Oxycodone(Percocet, oxycontin) and hydromorphone amongst others.  Long term and even short term use of opiods can cause: Increased pain response Dependence Constipation Depression Respiratory depression And more.  Withdrawal symptoms can include Flu like symptoms Nausea, vomiting And more Techniques to manage these symptoms Hydrate well Eat regular healthy meals Stay active Use relaxation techniques(deep breathing, meditating, yoga) Do Not substitute Alcohol to help with tapering If you have been on  opioids for less than two weeks and do not have pain than it is ok to stop all together.  Plan to wean off of opioids This plan should start within one week post op of your joint replacement. Maintain the same interval or time between taking each dose and first decrease the dose.  Cut the total daily intake of opioids by one tablet each day Next start to increase the time between doses. The last dose that should be eliminated is the evening dose.   IF YOU ARE TRANSFERRED TO  A SKILLED REHAB FACILITY If the patient is transferred to a skilled rehab facility following release from the hospital, a list of the current medications will be sent to the facility for the patient to continue.  When discharged from the skilled rehab facility, please have the facility set up the patient's Home Health Physical Therapy prior to being released. Also, the skilled facility will be responsible for providing the patient with their medications at time of release from the facility to include their pain medication, the muscle relaxants, and their blood thinner medication. If the patient is still at the rehab facility at time of the two week follow up appointment, the skilled rehab facility will also need to assist the patient in arranging follow up appointment in our office and any transportation needs.  MAKE SURE YOU:  Understand these instructions.  Get help right away if you are not doing well or get worse.   DENTAL ANTIBIOTICS:  In most cases prophylactic antibiotics for Dental procdeures after total joint surgery are not necessary.  Exceptions are as follows:  1. History of prior total joint infection  2. Severely immunocompromised (Organ Transplant, cancer chemotherapy, Rheumatoid biologic meds such as Humera)  3. Poorly controlled diabetes (A1C &gt; 8.0, blood glucose over 200)  If you have one of these conditions, contact your surgeon for an antibiotic prescription, prior to your dental procedure.     Pick up stool softner and laxative for home use following surgery while on pain medications. Do not submerge incision under water. Please use good hand washing techniques while changing dressing each day. May shower starting three days after surgery. Please use a clean towel to pat the incision dry following showers. Continue to use ice for pain and swelling after surgery. Do not use any lotions or creams on the incision until instructed by your surgeon.     High-Calorie, High-Protein Nutrition Therapy (2021) A high-calorie, high-protein diet has been recommended to you. Your registered dietitian nutritionist (RDN) may have recommended this diet because you are having difficulty eating enough calories throughout the day, you have lost weight, and/or you need to add protein to your diet. Sometimes you may not feel like eating, even if you know the importance of good nutrition. The recommendations in this handout can help you with the following: Regaining your strength and energy Keeping your body healthy Healing and recovering from surgery or illness and fighting infection Tips: Schedule Your Meals and Snacks Several small meals and snacks are often better tolerated and digested than large meals. Strategies Plan to eat 3 meals and 3 snacks daily. Experiment with timing meals to find out when you have a larger appetite. Appetite may be greatest in the morning after not eating all night so you may prefer to eat your larger meals and snacks in the morning and at lunch. Breakfast-type foods are often better tolerated so eat foods such as eggs, pancakes, waffles and cereal for any meal or snack. Carry snacks with you so you are prepared to eat every 2 to 3 hours. Determine what works best for you if your body's cues for feeling hungry or full are not working. Eat a small meal or snack even if you don't feel hungry. Set a timer to remind you when it is time to eat. Take a walk before you  eat (with health care provider's approval). Light or moderate physical activity can help you maintain muscle and increase your appetite. Make Eating Enjoyable Taking steps to make the experience enjoyable  may help to increase your interest in eating and improve your appetite. Strategies: Eat with others whenever possible. Include your favorite foods to make meals more enjoyable. Try new foods. Save your beverage for the end of the meal so that you have more room for food before you get full. Add Calories to Your Meals and Snacks Try adding calorie-dense foods so that each bite provides more nutrition. Strategies Drink milk, chocolate milk, soy milk, or smoothies instead of low-calorie beverages such as diet drinks or water. Cook with milk or soy milk instead of water when making dishes such as hot cereal, cocoa, or pudding. Add jelly, jam, honey, butter or margarine to bread and crackers. Add jam or fruit to ice cream and as a topping over cake. Mix dried fruit, nuts, granola, honey, or dry cereal with yogurt or hot cereals. Enjoy snacks such as milkshakes, smoothies, pudding, ice cream, or custard. Blend a fruit smoothie of a banana, frozen berries, milk or soy milk, and 1 tablespoon nonfat powdered milk or protein powder. Add Protein to Your Meals and Snacks Choose at least one protein food at each meal and snack to increase your daily intake. Strategies Add  cup nonfat dry milk powder or protein powder to make a high-protein milk to drink or to use in recipes that call for milk. Vanilla or peppermint extract or unsweetened cocoa powder could help to boost the flavor. Add hard-cooked eggs, leftover meat, grated cheese, canned beans or tofu to noodles, rice, salads, sandwiches, soups, casseroles, pasta, tuna and other mixed dishes. Add powdered milk or protein powder to hot cereals, meatloaf, casseroles, scrambled eggs, sauces, cream soups, and shakes. Add beans and lentils to salads,  soups, casseroles, and vegetable dishes. Eat cottage cheese or yogurt, especially Greek yogurt, with fruit as a snack or dessert. Eat peanut or other nut butters on crackers, bread, toast, waffles, apples, bananas or celery sticks. Add it to milkshakes, smoothies, or desserts. Consider a ready-made protein shake. Your RDN will make recommendations. Add Fats to Your Meals and Snacks Try adding fats to your meals and snacks. Fat provides more calories in fewer bites than carbohydrate or protein and adds flavors to your foods. Strategies Snack on nuts and seeds or add them to foods like salads, pasta, cereals, yogurt, and ice cream.  Saut or stir-fry vegetables, meats, chicken, fish or tofu in olive or canola oil.  Add olive oil, other vegetable oils, butter or margarine to soups, vegetables, potatoes, cooked cereal, rice, pasta, bread, crackers, pancakes, or waffles. Snack on olives or add to pasta, pizza, or salad. Add avocado or guacamole to your salads, sandwiches, and other entrees. Include fatty fish such as salmon in your weekly meal plan. For general food safety tips, especially for clients with immunocompromised conditions, ask your RDN for the Food Safety Nutrition Therapy handout. Small Meal and Snack Ideas These snacks and meals are recommended when you have to eat but aren't necessarily hungry.  They are good choices because they are high in protein and high in calories.  2 graham crackers 2 tablespoons peanut or other nut butter 1 cup milk 2 slices whole wheat toast topped with:  avocado, mashed Seasoning of your choice   cup Greek yogurt  cup fruit  cup granola 2 deviled egg halves 5 whole wheat crackers  1 cup cream of tomato soup  grilled cheese sandwich 1 toasted waffle topped with: 2 tablespoons peanut or nut butter 1 tablespoon jam  Trail mix made with:  cup  nuts  cup dried fruit  cup cold cereal, any variety  cup oatmeal or cream of wheat cereal 1  tablespoon peanut or nut butter  cup diced fruit   High-Calorie, High-Protein Sample 1-Day Menu View Nutrient Info Breakfast 1 egg, scrambled 1 ounce cheddar cheese 1 English muffin, whole wheat 1 tablespoon margarine 1 tablespoon jam  cup orange juice, fortified with calcium and vitamin D  Morning Snack 1 tablespoon peanut butter 1 banana 1 cup 1% milk  Lunch Tuna salad sandwich made with: 2 slices bread, whole wheat 3 ounces tuna mixed with: 1 tablespoon mayonnaise  cup pudding  Afternoon Snack  cup hummus  cup carrots 1 pita  Evening Meal Enchilada casserole made with: 2 corn tortillas 3 ounces ground beef, cooked  cup black beans, cooked  cup corn, cooked 1 ounce grated cheddar cheese  cup enchilada sauce  avocado, sliced, topping for enchilada 1 tablespoon sour cream, topping for enchilada Salad:  cup lettuce, shredded  cup tomatoes, chopped, for salad 1 tablespoon olive oil and vinegar dressing, for salad  Evening Snack  cup Greek yogurt  cup blueberries  cup granola

## 2023-09-28 NOTE — Progress Notes (Signed)
PROGRESS NOTE    Mathew Simpson  ZOX:[id number] DOB: March 11, 1936 DOA: 09/08/2023 PCP: [name], PA-C    Brief Narrative:   Mathew Simpson is a 87 y.o. male with past medical history of  persistent atrial fibrillation on Eliquis, HTN, type 2 diabetes mellitus, vertigo  presented to the [facility] ED on 09/08/23 via EMS from home after mechanical fall at home, might have fallen backwards with loss of consciousness.  Found by neighbor.  Patient was brought in by EMS and was having dizziness nausea vomiting.  Patient was on Eliquis as outpatient.  In the ED, patient had stable vitals.  Labs showed creatinine of 1.3.  Alcohol level less than 10.  Procalcitonin less than 0.10.  CK 75.  Chest x-ray with low lung volumes.  Pelvis x-ray negative.  CT head and C-spine without contrast with 3 mm subdural hematoma anterior falx, no midline shift, 6 mm hemorrhagic contusion posterior left frontal lobe, asymmetric widening of the space between the dens and left lateral mass of the C1 relative the right lateral mass. Patient was seen by neurosurgery. Follow-up CT scans demonstrated stable appearance small anterior falcine subdural and left frontal contusion does not require any acute intervention. Dr. Conchita Paris; with neurosurgery recommended  holding Eliquis for 2 weeks, no requirement of cervical immobilization and outpatient follow-up in neurosurg clinic as needed.    Assessment & Plan:   Subdural hematoma Traumatic brain injury Left frontal contusion  Scalp laceration Mechanical fall  Neurosurgery followed the patient during hospitalization.  Patient is undergoing conservative treatment at this time.  Recommendation of holding Eliquis for 2 weeks, no requirement of cervical immobilization and outpatient follow-up in neurosurg clinic as needed.  Staples removed on 09/18/2023.  CT head 09/20/23 with interval decrease in subdural hematoma.   Left knee pain, effusion secondary to MSSA septic arthritis.  Status post  arthrocentesis by orthopedics on 09/23/2023 with cultures showing methicillin sensitive Staph aureus.  On cefazolin.  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  Plan for irrigation and debridement of the knee with poly exchange as per orthopedics on 09/28/2023.  Continue to hold Eliquis for now and will resume after surgical intervention if okay with orthopedics.Marland Kitchen  Keep n.p.o. after midnight.  MSSA Bacteremia  Urinalysis negative.  Chest x-ray without any infiltrate.    CT scan of the chest abdomen pelvis without any acute findings.  TTE with LVEF 55-60%, RV systolic function mildly decreased, biatrial enlargement,  no notation of valvular vegetation although technically difficult study.  ID was consulted.  Blood cultures from 09/20/2023 with MSSA, repeat blood cultures from 09/22/2023 negative in 4 days.  Procalcitonin elevated at 5.5 but trending down. MRSA PCR negative.  Left knee aspiration with MSSA.  Currently on Cefazolin 2 g IV every 8 hours.  Continue supportive care.  Plan for irrigation and debridement on 09/28/2023.  Persistent atrial fibrillation with sinus pauses Seen by cardiology during hospitalization.  2D echocardiogram with LVEF 65 percent, normal RV function, mild biatrial enlargement, mild AI, dilated aortic root measuring 43 mm.  No indication for permanent pacemaker placement.  Cardizem dose has been decreased to 120 mg daily. Recommended Zio patch for 2 weeks on discharge.  Rate controlled at this time without any significant pauses.  Essential hypertension Continue diltiazem and Irbesartan.  Blood pressure seems stable today.  Type 2 diabetes mellitus Latest Hemoglobin A1c 6.7.  Continue sliding scale insulin.  Glycemic control adequate.  Latest POC glucose of 151.  Moderate protein  calorie malnutrition Body mass index is 27.98 kg/m. Etiology: social / environmental circumstances Signs/Symptoms: mild fat depletion, mild muscle depletion Interventions:  Ensure Enlive (each supplement provides 350kcal and 20 grams of protein), MVI Seen by dietitian.  Continue nutritional supplementation.  Hypokalemia.  Improved after replacement.  Latest potassium of 3.8.  Check CBC BMP in AM.  Hospital-acquired delirium Delirium precautions, Continue melatonin and Seroquel.  Appears to be stable at this time.  Weakness/debility/deconditioning/gait disturbance: PT/OT initially recommendied SNF placement, denied by insurance carrier following peer to peer. --TOC following, family appeal for insurance denial now approved, has authorization through 10/5 but still with septic joint infection.  DVT prophylaxis: TED hose to OR Start: 09/27/23 1939 SCDs Start: 09/08/23 2101    Code Status: Full Code  Family Communication:  Spoke with the patient's daughter Ms. [name]  on 09/24/2023. Marland Kitchen Disposition Plan:  Level of care: Telemetry Medical Status is: Inpatient  Remains inpatient appropriate because: MSSA bacteremia, left knee joint septic arthritis needing joint wash, pending SNF placement   Consultants:  Neurosurgery, Dr. Conchita Paris Cardiology Infectious disease Orthopedics  Procedures:  TTE Arthrocentesis 09-23-23  Antimicrobials:  Cefazolin 9/30>>   Subjective: Today, patient was seen and examined at bedside.  Complains of mild knee and back pain.  Feels sleepy.    Objective: Vitals:   09/27/23 2141 09/27/23 2326 09/28/23 0433 09/28/23 0753  BP: (!) 147/90 139/73 135/75 137/77  Pulse: 88 64 84 80  Resp: 18   17  Temp: 98.4 F (36.9 C) 98.7 F (37.1 C) 97.8 F (36.6 C) 99.4 F (37.4 C)  TempSrc: Oral Oral Oral Axillary  SpO2: 100% 97% 96% 98%  Weight:      Height:        Intake/Output Summary (Last 24 hours) at 09/28/2023 1028 Last data filed at 09/28/2023 0930 Gross per 24 hour  Intake 1533.8 ml  Output 2400 ml  Net -866.2 ml   Filed Weights   09/08/23 1601  Weight: 88.5 kg    Physical examination:  General: Average  built, chronically ill, elderly male, hard of hearing, mildly somnolent HENT:   No scleral pallor or icterus noted. Oral mucosa is moist.   Chest:   Diminished breath sounds bilaterally. No crackles or wheezes.  CVS: S1 &S2 heard. No murmur.  Regular rate and rhythm. Abdomen: Soft, nontender, nondistended.  Bowel sounds are heard.   Extremities: No cyanosis, clubbing.  Peripheral pulses are palpable.  Left knee scar with mild effusion and tenderness on palpation.  Moves lower extremity. Psych: Alert awake and Communicative, oriented to place and month. CNS:  No cranial nerve deficits.  Restricted left knee movement. Skin: Warm and dry.    Data Reviewed: I have personally reviewed the following labs and imaging studies.     CBC: Recent Labs  Lab 09/22/23 0511 09/23/23 0503 09/25/23 0637 09/26/23 0357 09/27/23 0550  WBC 6.9 6.9 8.6 8.1 9.4  HGB 12.7* 11.6* 12.2* 11.7* 11.5*  HCT 37.9* 35.5* 36.8* 35.4* 33.9*  MCV 92.2 90.3 90.9 90.5 91.9  PLT 101* 98* 130* 150 181   Basic Metabolic Panel: Recent Labs  Lab 09/22/23 0511 09/23/23 0503 09/25/23 0637 09/26/23 0357 09/27/23 0550  NA 147* 141 141 141 141  K 3.6 3.2* 3.2* 3.2* 3.8  CL 118* 113* 104 105 100  CO2 19* 19* 25 24 25   GLUCOSE 127* 163* 149* 149* 145*  BUN 31* 27* 18 19 16   CREATININE 1.33* 1.05 0.94 0.96 0.90  CALCIUM 8.0* 7.9* 7.9*  7.8* 7.9*  MG 2.2  --  2.2 2.1  --   PHOS 2.6  --   --   --   --    GFR: Estimated Creatinine Clearance: 67.2 mL/min (by C-G formula based on SCr of 0.9 mg/dL). Liver Function Tests: Recent Labs  Lab 09/22/23 0511  AST 53*  ALT 52*  ALKPHOS 70  BILITOT 0.9  PROT 5.6*  ALBUMIN 2.3*   No results for input(s): "LIPASE", "AMYLASE" in the last 168 hours. No results for input(s): "AMMONIA" in the last 168 hours. Coagulation Profile: No results for input(s): "INR", "PROTIME" in the last 168 hours. Cardiac Enzymes: No results for input(s): "CKTOTAL", "CKMB", "CKMBINDEX",  "TROPONINI" in the last 168 hours. BNP (last 3 results) No results for input(s): "PROBNP" in the last 8760 hours. HbA1C: No results for input(s): "HGBA1C" in the last 72 hours. CBG: Recent Labs  Lab 09/27/23 0616 09/27/23 1123 09/27/23 1535 09/27/23 2145 09/28/23 0615  GLUCAP 144* 143* 156* 120* 151*   Lipid Profile: No results for input(s): "CHOL", "HDL", "LDLCALC", "TRIG", "CHOLHDL", "LDLDIRECT" in the last 72 hours. Thyroid Function Tests: No results for input(s): "TSH", "T4TOTAL", "FREET4", "T3FREE", "THYROIDAB" in the last 72 hours.  Anemia Panel: No results for input(s): "VITAMINB12", "FOLATE", "FERRITIN", "TIBC", "IRON", "RETICCTPCT" in the last 72 hours. Sepsis Labs: Recent Labs  Lab 09/22/23 0511 09/22/23 0620 09/23/23 0503  PROCALCITON  --  8.09 5.05  LATICACIDVEN 1.4  --   --     Recent Results (from the past 240 hour(s))  Urine Culture (for pregnant, neutropenic or urologic patients or patients with an indwelling urinary catheter)     Status: Abnormal   Collection Time: 09/20/23  9:47 PM   Specimen: Urine, Clean Catch  Result Value Ref Range Status   Specimen Description URINE, CLEAN CATCH  Final   Special Requests   Final    NONE Performed at [facility], [address] [place], [place] [address]    Culture 60,000 COLONIES/mL STAPHYLOCOCCUS AUREUS (A)  Final   Report Status 09/23/2023 FINAL  Final   Organism ID, Bacteria STAPHYLOCOCCUS AUREUS (A)  Final      Susceptibility   Staphylococcus aureus - MIC*    CIPROFLOXACIN <=0.5 SENSITIVE Sensitive     GENTAMICIN <=0.5 SENSITIVE Sensitive     NITROFURANTOIN <=16 SENSITIVE Sensitive     OXACILLIN <=0.25 SENSITIVE Sensitive     TETRACYCLINE <=1 SENSITIVE Sensitive     VANCOMYCIN <=0.5 SENSITIVE Sensitive     TRIMETH/SULFA <=10 SENSITIVE Sensitive     CLINDAMYCIN <=0.25 SENSITIVE Sensitive     RIFAMPIN <=0.5 SENSITIVE Sensitive     Inducible Clindamycin NEGATIVE Sensitive     LINEZOLID 2  SENSITIVE Sensitive     * 60,000 COLONIES/mL STAPHYLOCOCCUS AUREUS  Culture, blood (Routine X 2) w Reflex to ID Panel     Status: Abnormal   Collection Time: 09/20/23 10:54 PM   Specimen: BLOOD RIGHT ARM  Result Value Ref Range Status   Specimen Description BLOOD RIGHT ARM  Final   Special Requests   Final    BOTTLES DRAWN AEROBIC AND ANAEROBIC Blood Culture adequate volume   Culture  Setup Time   Final    GRAM POSITIVE COCCI IN CLUSTERS IN BOTH AEROBIC AND ANAEROBIC BOTTLES CRITICAL VALUE NOTED.  VALUE IS CONSISTENT WITH PREVIOUSLY REPORTED AND CALLED VALUE.    Culture (A)  Final    STAPHYLOCOCCUS AUREUS SUSCEPTIBILITIES PERFORMED ON PREVIOUS CULTURE WITHIN THE LAST 5 DAYS. Performed at  [facility], [address] [place], [place] [address]    Report Status 09/23/2023 FINAL  Final  Culture, blood (Routine X 2) w Reflex to ID Panel     Status: Abnormal   Collection Time: 09/20/23 10:56 PM   Specimen: BLOOD RIGHT HAND  Result Value Ref Range Status   Specimen Description BLOOD RIGHT HAND  Final   Special Requests   Final    BOTTLES DRAWN AEROBIC AND ANAEROBIC Blood Culture adequate volume   Culture  Setup Time   Final    GRAM POSITIVE COCCI IN CLUSTERS IN BOTH AEROBIC AND ANAEROBIC BOTTLES CRITICAL RESULT CALLED TO, READ BACK BY AND VERIFIED WITHSalina April PHARMD, AT 1144 09/21/23 Performed at [facility], [address] [place], [place] [address]    Culture STAPHYLOCOCCUS AUREUS (A)  Final   Report Status 09/23/2023 FINAL  Final   Organism ID, Bacteria STAPHYLOCOCCUS AUREUS  Final      Susceptibility   Staphylococcus aureus - MIC*    CIPROFLOXACIN <=0.5 SENSITIVE Sensitive     ERYTHROMYCIN <=0.25 SENSITIVE Sensitive     GENTAMICIN <=0.5 SENSITIVE Sensitive     OXACILLIN 0.5 SENSITIVE Sensitive     TETRACYCLINE <=1 SENSITIVE Sensitive     VANCOMYCIN <=0.5 SENSITIVE Sensitive     TRIMETH/SULFA <=10 SENSITIVE Sensitive     CLINDAMYCIN <=0.25 SENSITIVE  Sensitive     RIFAMPIN <=0.5 SENSITIVE Sensitive     Inducible Clindamycin NEGATIVE Sensitive     LINEZOLID 2 SENSITIVE Sensitive     * STAPHYLOCOCCUS AUREUS  Blood Culture ID Panel (Reflexed)     Status: Abnormal   Collection Time: 09/20/23 10:56 PM  Result Value Ref Range Status   Enterococcus faecalis NOT DETECTED NOT DETECTED Final   Enterococcus Faecium NOT DETECTED NOT DETECTED Final   Listeria monocytogenes NOT DETECTED NOT DETECTED Final   Staphylococcus species DETECTED (A) NOT DETECTED Final    Comment: CRITICAL RESULT CALLED TO, READ BACK BY AND VERIFIED WITH: A. UTOMWEN PHARMD, AT 1144 09/21/23    Staphylococcus aureus (BCID) DETECTED (A) NOT DETECTED Final    Comment: CRITICAL RESULT CALLED TO, READ BACK BY AND VERIFIED WITH: A. UTOMWEN PHARMD, AT 1144 09/21/23    Staphylococcus epidermidis NOT DETECTED NOT DETECTED Final   Staphylococcus lugdunensis NOT DETECTED NOT DETECTED Final   Streptococcus species NOT DETECTED NOT DETECTED Final   Streptococcus agalactiae NOT DETECTED NOT DETECTED Final   Streptococcus pneumoniae NOT DETECTED NOT DETECTED Final   Streptococcus pyogenes NOT DETECTED NOT DETECTED Final   A.calcoaceticus-baumannii NOT DETECTED NOT DETECTED Final   Bacteroides fragilis NOT DETECTED NOT DETECTED Final   Enterobacterales NOT DETECTED NOT DETECTED Final   Enterobacter cloacae complex NOT DETECTED NOT DETECTED Final   Escherichia coli NOT DETECTED NOT DETECTED Final   Klebsiella aerogenes NOT DETECTED NOT DETECTED Final   Klebsiella oxytoca NOT DETECTED NOT DETECTED Final   Klebsiella pneumoniae NOT DETECTED NOT DETECTED Final   Proteus species NOT DETECTED NOT DETECTED Final   Salmonella species NOT DETECTED NOT DETECTED Final   Serratia marcescens NOT DETECTED NOT DETECTED Final   Haemophilus influenzae NOT DETECTED NOT DETECTED Final   Neisseria meningitidis NOT DETECTED NOT DETECTED Final   Pseudomonas aeruginosa NOT DETECTED NOT DETECTED Final    Stenotrophomonas maltophilia NOT DETECTED NOT DETECTED Final   Candida albicans NOT DETECTED NOT DETECTED Final   Candida auris NOT DETECTED NOT DETECTED Final   Candida glabrata NOT DETECTED NOT DETECTED Final  Candida krusei NOT DETECTED NOT DETECTED Final   Candida parapsilosis NOT DETECTED NOT DETECTED Final   Candida tropicalis NOT DETECTED NOT DETECTED Final   Cryptococcus neoformans/gattii NOT DETECTED NOT DETECTED Final   Meth resistant mecA/C and MREJ NOT DETECTED NOT DETECTED Final    Comment: Performed at [facility], [address] [place], [place] [address]  MRSA Next Gen by PCR, Nasal     Status: None   Collection Time: 09/21/23  7:12 AM   Specimen: Nasal Mucosa; Nasal Swab  Result Value Ref Range Status   MRSA by PCR Next Gen NOT DETECTED NOT DETECTED Final    Comment: (NOTE) The GeneXpert MRSA Assay (FDA approved for NASAL specimens only), is one component of a comprehensive MRSA colonization surveillance program. It is not intended to diagnose MRSA infection nor to guide or monitor treatment for MRSA infections. Test performance is not FDA approved in patients less than 68 years old. Performed at [facility], [address], [place], [place] [address]   Culture, blood (Routine X 2) w Reflex to ID Panel     Status: None   Collection Time: 09/22/23  6:20 AM   Specimen: BLOOD RIGHT HAND  Result Value Ref Range Status   Specimen Description BLOOD RIGHT HAND  Final   Special Requests   Final    BOTTLES DRAWN AEROBIC AND ANAEROBIC Blood Culture results may not be optimal due to an excessive volume of blood received in culture bottles   Culture   Final    NO GROWTH 5 DAYS Performed at [facility], [address] [place], [place] [address]    Report Status 09/27/2023 FINAL  Final  Culture, blood (Routine X 2) w Reflex to ID Panel     Status: None   Collection Time: 09/22/23  6:25 AM   Specimen: BLOOD LEFT HAND  Result Value Ref Range Status    Specimen Description BLOOD LEFT HAND  Final   Special Requests   Final    BOTTLES DRAWN AEROBIC AND ANAEROBIC Blood Culture results may not be optimal due to an excessive volume of blood received in culture bottles   Culture   Final    NO GROWTH 5 DAYS Performed at [facility], [address], [place], [place] [address]    Report Status 09/27/2023 FINAL  Final  Body fluid culture w Gram Stain     Status: None   Collection Time: 09/23/23  2:20 PM   Specimen: Synovium; Body Fluid  Result Value Ref Range Status   Specimen Description SYNOVIAL LEFT KNEE  Final   Special Requests NONE  Final   Gram Stain   Final    ABUNDANT WBC PRESENT, PREDOMINANTLY PMN FEW GRAM POSITIVE COCCI IN PAIRS CRITICAL RESULT CALLED TO, READ BACK BY AND VERIFIED WITH: RN ALEXIS MORGAN ON 09/23/23 @ 1909 BY DRT Performed at [facility], [address] [place], [place] [address]    Culture MODERATE STAPHYLOCOCCUS AUREUS  Final   Report Status 09/25/2023 FINAL  Final   Organism ID, Bacteria STAPHYLOCOCCUS AUREUS  Final      Susceptibility   Staphylococcus aureus - MIC*    CIPROFLOXACIN <=0.5 SENSITIVE Sensitive     ERYTHROMYCIN <=0.25 SENSITIVE Sensitive     GENTAMICIN <=0.5 SENSITIVE Sensitive     OXACILLIN 0.5 SENSITIVE Sensitive     TETRACYCLINE <=1 SENSITIVE Sensitive     VANCOMYCIN <=0.5 SENSITIVE Sensitive     TRIMETH/SULFA <=10  SENSITIVE Sensitive     CLINDAMYCIN <=0.25 SENSITIVE Sensitive     RIFAMPIN <=0.5 SENSITIVE Sensitive     Inducible Clindamycin NEGATIVE Sensitive     LINEZOLID 2 SENSITIVE Sensitive     * MODERATE STAPHYLOCOCCUS AUREUS      Radiology Studies: No results found.   Scheduled Meds:  diltiazem  120 mg Oral Daily   feeding supplement  1 Container Oral TID BM   Gerhardt's butt cream   Topical TID   insulin aspart  0-5 Units Subcutaneous QHS   insulin aspart  0-9 Units Subcutaneous TID WC   irbesartan  300 mg Oral Daily   melatonin  6 mg Oral QHS   metoprolol  tartrate  12.5 mg Oral BID   multivitamin with minerals  1 tablet Oral Daily   polyethylene glycol  17 g Oral BID   QUEtiapine  12.5 mg Oral QHS   Continuous Infusions:   ceFAZolin (ANCEF) IV Stopped (09/28/23 0535)   lactated ringers 75 mL/hr at 09/28/23 1015      LOS: 11 days    Joycelyn Das, MD [facility] Available via Epic secure chat 7am-7pm After these hours, please refer to coverage provider listed on [email or web address] 09/28/2023, 10:28 AM

## 2023-09-28 NOTE — Plan of Care (Signed)
  Problem: Education: Goal: Ability to describe self-care measures that may prevent or decrease complications (Diabetes Survival Skills Education) will improve Outcome: Progressing Goal: Individualized Educational Video(s) Outcome: Progressing   Problem: Coping: Goal: Ability to adjust to condition or change in health will improve Outcome: Progressing   Problem: Fluid Volume: Goal: Ability to maintain a balanced intake and output will improve Outcome: Progressing   Problem: Health Behavior/Discharge Planning: Goal: Ability to identify and utilize available resources and services will improve Outcome: Progressing Goal: Ability to manage health-related needs will improve Outcome: Progressing   Problem: Metabolic: Goal: Ability to maintain appropriate glucose levels will improve Outcome: Progressing   Problem: Nutritional: Goal: Maintenance of adequate nutrition will improve Outcome: Progressing Goal: Progress toward achieving an optimal weight will improve Outcome: Progressing   Problem: Skin Integrity: Goal: Risk for impaired skin integrity will decrease Outcome: Progressing   Problem: Tissue Perfusion: Goal: Adequacy of tissue perfusion will improve Outcome: Progressing   Problem: Education: Goal: Knowledge of General Education information will improve Description: Including pain rating scale, medication(s)/side effects and non-pharmacologic comfort measures Outcome: Progressing   Problem: Health Behavior/Discharge Planning: Goal: Ability to manage health-related needs will improve Outcome: Progressing   Problem: Clinical Measurements: Goal: Ability to maintain clinical measurements within normal limits will improve Outcome: Progressing Goal: Will remain free from infection Outcome: Progressing Goal: Diagnostic test results will improve Outcome: Progressing Goal: Cardiovascular complication will be avoided Outcome: Progressing   Problem: Activity: Goal: Risk  for activity intolerance will decrease Outcome: Progressing   Problem: Nutrition: Goal: Adequate nutrition will be maintained Outcome: Progressing   Problem: Coping: Goal: Level of anxiety will decrease Outcome: Progressing   Problem: Elimination: Goal: Will not experience complications related to bowel motility Outcome: Progressing Goal: Will not experience complications related to urinary retention Outcome: Progressing   Problem: Pain Managment: Goal: General experience of comfort will improve Outcome: Progressing   Problem: Safety: Goal: Ability to remain free from injury will improve Outcome: Progressing   Problem: Skin Integrity: Goal: Risk for impaired skin integrity will decrease Outcome: Progressing

## 2023-09-28 NOTE — Transfer of Care (Signed)
Immediate Anesthesia Transfer of Care Note  Patient: Mathew Simpson  Procedure(s) Performed: IRRIGATION AND DEBRIDEMENT KNEE WITH POLY EXCHANGE (Left: Knee)  Patient Location: PACU  Anesthesia Type:General  Level of Consciousness: awake, alert , and oriented  Airway & Oxygen Therapy: Patient Spontanous Breathing and Patient connected to face mask oxygen  Post-op Assessment: Report given to RN and Post -op Vital signs reviewed and stable  Post vital signs: Reviewed and stable  Last Vitals:  Vitals Value Taken Time  BP 155/86 09/28/23 1810  Temp    Pulse 101 09/28/23 1812  Resp 22 09/28/23 1812  SpO2 99 % 09/28/23 1812  Vitals shown include unfiled device data.  Last Pain:  Vitals:   09/28/23 1228  TempSrc: Oral  PainSc:       Patients Stated Pain Goal: 0 (09/24/23 1650)  Complications: No notable events documented.

## 2023-09-29 ENCOUNTER — Encounter (HOSPITAL_COMMUNITY): Payer: Self-pay | Admitting: Orthopedic Surgery

## 2023-09-29 ENCOUNTER — Other Ambulatory Visit: Payer: Self-pay

## 2023-09-29 ENCOUNTER — Inpatient Hospital Stay (HOSPITAL_COMMUNITY): Payer: Medicare HMO

## 2023-09-29 DIAGNOSIS — E119 Type 2 diabetes mellitus without complications: Secondary | ICD-10-CM | POA: Diagnosis not present

## 2023-09-29 DIAGNOSIS — S06309D Unspecified focal traumatic brain injury with loss of consciousness of unspecified duration, subsequent encounter: Secondary | ICD-10-CM | POA: Diagnosis not present

## 2023-09-29 DIAGNOSIS — I482 Chronic atrial fibrillation, unspecified: Secondary | ICD-10-CM | POA: Diagnosis not present

## 2023-09-29 DIAGNOSIS — R001 Bradycardia, unspecified: Secondary | ICD-10-CM | POA: Diagnosis not present

## 2023-09-29 LAB — GLUCOSE, CAPILLARY
Glucose-Capillary: 219 mg/dL — ABNORMAL HIGH (ref 70–99)
Glucose-Capillary: 244 mg/dL — ABNORMAL HIGH (ref 70–99)
Glucose-Capillary: 394 mg/dL — ABNORMAL HIGH (ref 70–99)
Glucose-Capillary: 152 mg/dL — ABNORMAL HIGH (ref 70–99)

## 2023-09-29 LAB — CBC
HCT: 37.6 % — ABNORMAL LOW (ref 39.0–52.0)
Hemoglobin: 12.2 g/dL — ABNORMAL LOW (ref 13.0–17.0)
MCH: 30.7 pg (ref 26.0–34.0)
MCHC: 32.4 g/dL (ref 30.0–36.0)
MCV: 94.5 fL (ref 80.0–100.0)
Platelets: 248 10*3/uL (ref 150–400)
RBC: 3.98 MIL/uL — ABNORMAL LOW (ref 4.22–5.81)
RDW: 14.5 % (ref 11.5–15.5)
WBC: 15.3 10*3/uL — ABNORMAL HIGH (ref 4.0–10.5)
nRBC: 0 % (ref 0.0–0.2)

## 2023-09-29 LAB — BASIC METABOLIC PANEL
Anion gap: 14 (ref 5–15)
BUN: 19 mg/dL (ref 8–23)
CO2: 25 mmol/L (ref 22–32)
Calcium: 7.9 mg/dL — ABNORMAL LOW (ref 8.9–10.3)
Chloride: 100 mmol/L (ref 98–111)
Creatinine, Ser: 1.17 mg/dL (ref 0.61–1.24)
GFR, Estimated: >60 mL/min (ref >60)
Glucose, Bld: 189 mg/dL — ABNORMAL HIGH (ref 70–99)
Potassium: 3.8 mmol/L (ref 3.5–5.1)
Sodium: 139 mmol/L (ref 135–145)

## 2023-09-29 LAB — MAGNESIUM: Magnesium: 2 mg/dL (ref 1.7–2.4)

## 2023-09-29 MED ORDER — SODIUM CHLORIDE 0.9% FLUSH: 10.0000 mL | INTRAVENOUS | Status: DC | PRN

## 2023-09-29 MED ORDER — CEPHALEXIN 500 MG PO CAPS
500.0000 mg | ORAL_CAPSULE | Freq: Once | ORAL | Status: AC
  Administered 2023-09-29: 500 mg via ORAL
  Filled 2023-09-29: qty 1

## 2023-09-29 MED ORDER — APIXABAN 5 MG PO TABS
5.0000 mg | ORAL_TABLET | Freq: Two times a day (BID) | ORAL | Status: DC
  Administered 2023-10-01: 5 mg via ORAL
  Filled 2023-09-29: qty 1

## 2023-09-29 MED ORDER — EPINEPHRINE 0.3 MG/0.3ML IJ SOAJ: 0.3000 mg | Freq: Once | INTRAMUSCULAR | Status: DC | PRN

## 2023-09-29 MED ORDER — SODIUM CHLORIDE 0.9% FLUSH
10.0000 mL | Freq: Two times a day (BID) | INTRAVENOUS | Status: DC
  Administered 2023-09-29 – 2023-09-30 (×2): 10 mL

## 2023-09-29 MED ORDER — DIPHENHYDRAMINE HCL 50 MG/ML IJ SOLN: 25.0000 mg | Freq: Once | INTRAMUSCULAR | Status: DC | PRN

## 2023-09-29 MED ORDER — SODIUM CHLORIDE 0.9% FLUSH
10.0000 mL | Freq: Two times a day (BID) | INTRAVENOUS | Status: DC
  Administered 2023-09-30 – 2023-10-01 (×3): 10 mL via INTRAVENOUS

## 2023-09-29 MED ORDER — CHLORHEXIDINE GLUCONATE CLOTH 2 % EX PADS
6.0000 | MEDICATED_PAD | Freq: Every day | CUTANEOUS | Status: DC
  Administered 2023-09-29 – 2023-10-01 (×3): 6 via TOPICAL

## 2023-09-29 NOTE — Evaluation (Signed)
Physical Therapy Re-Evaluation Patient Details Name: Mathew Simpson MRN: [id number] DOB: June 28, 1936 Today's Date: 09/29/2023  History of Present Illness  KESLER DOMRES is a 87 y.o. male admitted 9/17 to the hospital after presenting to the emergency department after a fall.  CT scan was therefore obtained which demonstrated a small intracranial hemorrhage in the left frontal/parietal region. C-spine CT was worrisome for instability but pt seen by neurosurgery and repeat CT demonstrated stable on no requirement for cervical immobilization. Pt with worsening confusion but CT on 9/29 demonstrated decreased size in SDH.  Pt did have fever 9/29 - ongoing workup, noted possible need for TEE.  Underwent I&D with poly liner exchange L knee on 09/28/23.  PMH for hypertension, glaucoma, hx of B TKR.  Clinical Impression  Patient presents s/p above procedure still with limited arousal initially, though cooperative and more alert as session progressed.  Needing +2 A for up to EOB and for sit to stand trials, though pt tolerated better than previous sessions.  Patient remains appropriate for inpatient rehab (<3 hours/day).  PT will continue to follow in the acute setting.         If plan is discharge home, recommend the following: Two people to help with walking and/or transfers;Two people to help with bathing/dressing/bathroom;Assist for transportation;Help with stairs or ramp for entrance;Assistance with cooking/housework   Can travel by private vehicle   No    Equipment Recommendations None recommended by PT  Recommendations for Other Services       Functional Status Assessment Patient has had a recent decline in their functional status and demonstrates the ability to make significant improvements in function in a reasonable and predictable amount of time.     Precautions / Restrictions Precautions Precautions: Fall Restrictions Weight Bearing Restrictions: No Other Position/Activity Restrictions:  WBAT L LE      Mobility  Bed Mobility Overal bed mobility: Needs Assistance Bed Mobility: Supine to Sit, Sit to Supine     Supine to sit: Used rails, +2 for physical assistance, Mod assist, HOB elevated Sit to supine: Mod assist, +2 for safety/equipment, Used rails   General bed mobility comments: increased time and cues for moving L LE to EOB and scooting on bed pads for hips and A to lift trunk with cues to use rails; to supine into sidelying with rail and assist for legs onto bed    Transfers Overall transfer level: Needs assistance Equipment used: Rolling walker (2 wheels) Transfers: Sit to/from Stand Sit to Stand: Max assist, +2 physical assistance, From elevated surface           General transfer comment: attempted standing x 2 and pt initially unable to get R leg extended under him, second attempt gave more help with bed pad under hip to lift up to stand and pt still flexed and reported feeling weak unable to extend L knee and trunk    Ambulation/Gait                  Stairs            Wheelchair Mobility     Tilt Bed    Modified Rankin (Stroke Patients Only)       Balance Overall balance assessment: Needs assistance Sitting-balance support: Bilateral upper extremity supported, No upper extremity supported Sitting balance-Leahy Scale: Fair Sitting balance - Comments: able to sit unaided     Standing balance-Leahy Scale: Zero Standing balance comment: +2 A for attempts at standing  Pertinent Vitals/Pain Pain Assessment Faces Pain Scale: Hurts even more Pain Location: L knee Pain Descriptors / Indicators: Grimacing, Guarding Pain Intervention(s): Monitored during session, Repositioned, Limited activity within patient's tolerance    Home Living Family/patient expects to be discharged to:: Skilled nursing facility                 Home Equipment: Agricultural consultant (2 wheels);Shower seat;Grab bars  - tub/shower      Prior Function Prior Level of Function : Patient poor historian/Family not available                     Extremity/Trunk Assessment   Upper Extremity Assessment Upper Extremity Assessment: Generalized weakness    Lower Extremity Assessment Lower Extremity Assessment: LLE deficits/detail LLE Deficits / Details: flexed and externally rotated initially, able to reposition with about 15 degrees knee flexion with ace wrap and obvious edema, pt able to perform quad sets in bed and ankle AROM WFL    Cervical / Trunk Assessment Cervical / Trunk Assessment: Kyphotic  Communication   Communication Communication: Hearing impairment  Cognition Arousal: Lethargic Behavior During Therapy: Anxious Overall Cognitive Status: No family/caregiver present to determine baseline cognitive functioning                                 General Comments: lethargic initially falling asleep eating his ice cream, increased time and more alert and very interactive throughout remainder of session; aware of L knee surgery though kept perseverating on feeling like MD made a mistake initially leading to his issues        General Comments General comments (skin integrity, edema, etc.): jovial and reported he felt he liked [place]; RN reported he was gruff and felt he might hit her when she moved L LE.    Exercises Total Joint Exercises Ankle Circles/Pumps: Both, 5 reps, Supine, AROM Quad Sets: AROM, 5 reps, Both, Supine Heel Slides: AROM, AAROM, 5 reps, Left, Seated   Assessment/Plan    PT Assessment Patient needs continued PT services  PT Problem List Decreased strength;Decreased balance;Decreased cognition;Decreased mobility;Decreased activity tolerance;Pain;Decreased knowledge of use of DME       PT Treatment Interventions DME instruction;Functional mobility training;Balance training;Patient/family education;Therapeutic activities;Gait training;Therapeutic  exercise;Cognitive remediation    PT Goals (Current goals can be found in the Care Plan section)  Acute Rehab PT Goals Patient Stated Goal: get stronger PT Goal Formulation: With patient Time For Goal Achievement: 10/13/23 Potential to Achieve Goals: Fair    Frequency Min 1X/week     Co-evaluation               AM-PAC PT "6 Clicks" Mobility  Outcome Measure Help needed turning from your back to your side while in a flat bed without using bedrails?: A Lot Help needed moving from lying on your back to sitting on the side of a flat bed without using bedrails?: Total Help needed moving to and from a bed to a chair (including a wheelchair)?: Total Help needed standing up from a chair using your arms (e.g., wheelchair or bedside chair)?: Total Help needed to walk in hospital room?: Total Help needed climbing 3-5 steps with a railing? : Total 6 Click Score: 7    End of Session Equipment Utilized During Treatment: Gait belt;Oxygen Activity Tolerance: Patient limited by fatigue;Patient limited by pain Patient left: in bed;with call bell/phone within reach;with bed alarm set Nurse Communication: Mobility status  PT Visit Diagnosis: Other abnormalities of gait and mobility (R26.89);Muscle weakness (generalized) (M62.81);Pain Pain - Right/Left: Left Pain - part of body: Knee    Time: 6213-0865 PT Time Calculation (min) (ACUTE ONLY): 35 min   Charges:   PT Evaluation $PT Re-evaluation: 1 Re-eval PT Treatments $Therapeutic Activity: 8-22 mins PT General Charges $$ ACUTE PT VISIT: 1 Visit         Sheran Lawless, PT Acute Rehabilitation Services Office:[phone] 09/29/2023   Elray Mcgregor 09/29/2023, 2:35 PM

## 2023-09-29 NOTE — Progress Notes (Signed)
Oral antibiotic given, BP was stable post one hour after administration pt had no complaints.   Balinda Quails, RN 09/29/2023 5:43 PM

## 2023-09-29 NOTE — Progress Notes (Signed)
Peripherally Inserted Central Catheter Placement  The IV Nurse has discussed with the patient and/or persons authorized to consent for the patient, the purpose of this procedure and the potential benefits and risks involved with this procedure.  The benefits include less needle sticks, lab draws from the catheter, and the patient may be discharged home with the catheter. Risks include, but not limited to, infection, bleeding, blood clot (thrombus formation), and puncture of an artery; nerve damage and irregular heartbeat and possibility to perform a PICC exchange if needed/ordered by physician.  Alternatives to this procedure were also discussed.  Bard Power PICC patient education guide, fact sheet on infection prevention and patient information card has been provided to patient /or left at bedside.    PICC Placement Documentation  PICC Single Lumen 09/29/23 Right Basilic 39 cm 0 cm (Active)  Indication for Insertion or Continuance of Line Prolonged intravenous therapies 09/29/23 1858  Exposed Catheter (cm) 0 cm 09/29/23 1858  Site Assessment Clean, Dry, Intact 09/29/23 1858  Line Status Flushed;Saline locked;Blood return noted 09/29/23 1858  Dressing Type Transparent;Securing device 09/29/23 1858  Dressing Status Antimicrobial disc in place 09/29/23 1858  Line Care Connections checked and tightened 09/29/23 1858  Line Adjustment (NICU/IV Team Only) No 09/29/23 1858  Dressing Intervention New dressing 09/29/23 1858  Dressing Change Due 10/06/23 09/29/23 1858       Mathew Simpson  [name] 09/29/2023, 6:59 PM

## 2023-09-29 NOTE — Progress Notes (Signed)
[facility]  Date of Admission:  09/08/2023      Total days of antibiotics   Cefazolin 9/29 >> c           ASSESSMENT: Mathew Simpson is a 87 y.o. male   MSSA Bacteremia, Nosocomial -  -PIV related - phlebitis resolved now -continue cefazolin IV  -TTE repeat looks un-concerning. Defer TEE with no absolute need in this patient given he will have 6 weeks IV abx + oral tail for minimum 6 months  -While we did not do blood cultures on admission, there was no reason to as there was no infectious concern at that time.  -This appears most c/w nosocomial acquired bacteremia from PIV that has seeded his knee.  -follow repeat BCx and fever curve.  -PICC line ordered  -Left knee PJI s/p DAIR 09/28/23   Left Knee PJI -  S/P DAIR 10/7 -  -post op care per ortho team  -6 week course of IV Cefazolin then conversion to oral suppression with cefadroxil   PCN Allergy -  -h/o PCN allergy in the chart -had script for keflex in Aug 2023 during cellulitis / sepsis picture.  -may be helpful to do amox challenge now inpatient if appropriate for first gen cephalosporins outpatient for suppression    PLAN: Continue cefazolin  PICC line ordered  OPAT plan  FU the pending OR cultures remotely but all suspected to be staph    OPAT ORDERS:  Diagnosis: Left Knee PJI  Culture Result: MSSA  Allergies  Allergen Reactions   Penicillins Anaphylaxis    Tolerates ancef  Did it involve swelling of the face/tongue/throat, SOB, or low BP? Yes Did it involve sudden or severe rash/hives, skin peeling, or any reaction on the inside of your mouth or nose? Unknown Did you need to seek medical attention at a hospital or doctor's office? Yes When did it last happen?      50+ years If all above answers are "NO", may proceed with cephalosporin use.      Discharge antibiotics to be given via PICC line:  Cefazolin 2 gm IV TID    Duration: 6 weeks   End  Date: 11/09/23  [facility] Care Per Protocol with [employer] Use: Home health RN for IV administration and teaching, line care and labs.    Labs weekly while on IV antibiotics: _x_ CBC with differential __ BMP **TWICE WEEKLY ON VANCOMYCIN  _x_ CMP _x_ CRP _x_ ESR __ Vancomycin trough TWICE WEEKLY __ CK  _x_ Please pull PIC at completion of IV antibiotics __ Please leave PIC in place until doctor has seen patient or been notified  Fax weekly labs to [phone]  Clinic Follow Up Appt: Dr. Drue Second 11/13  @ 9:30 am    Principal Problem:   Prosthetic joint infection (HCC) Active Problems:   Essential hypertension   Chronic atrial fibrillation (HCC)   Bleeding in head following injury with loss of consciousness (HCC)   Subdural hematoma (HCC)   DM2 (diabetes mellitus, type 2) (HCC)   Neck injury   Fall   Scalp laceration   Bradycardia   Protein-calorie malnutrition, moderate (HCC)   SDH (subdural hematoma) (HCC)   Staphylococcus aureus bacteremia   Effusion of left knee    acetaminophen  1,000 mg Oral Q6H   apixaban  2.5 mg Oral Q12H   [START ON 10/01/2023] apixaban  5 mg Oral BID   diltiazem  120 mg  Oral Daily   docusate sodium  100 mg Oral BID   feeding supplement  1 Container Oral TID BM   Gerhardt's butt cream   Topical TID   insulin aspart  0-5 Units Subcutaneous QHS   insulin aspart  0-9 Units Subcutaneous TID WC   irbesartan  300 mg Oral Daily   melatonin  6 mg Oral QHS   metoprolol tartrate  12.5 mg Oral BID   multivitamin with minerals  1 tablet Oral Daily   polyethylene glycol  17 g Oral BID   QUEtiapine  12.5 mg Oral QHS   sodium chloride flush  10 mL Intravenous Q12H    SUBJECTIVE: Sleepy today - does not remember conversations re: surgery for knee    Review of Systems: Review of Systems  Constitutional:  Negative for chills and fever.  Gastrointestinal:  Negative for abdominal pain, nausea and vomiting.  Psychiatric/Behavioral:  Negative for  suicidal ideas.      Allergies  Allergen Reactions   Penicillins Anaphylaxis    Tolerates ancef  Did it involve swelling of the face/tongue/throat, SOB, or low BP? Yes Did it involve sudden or severe rash/hives, skin peeling, or any reaction on the inside of your mouth or nose? Unknown Did you need to seek medical attention at a hospital or doctor's office? Yes When did it last happen?      50+ years If all above answers are "NO", may proceed with cephalosporin use.     OBJECTIVE: Vitals:   09/29/23 0022 09/29/23 0400 09/29/23 0751 09/29/23 1137  BP: (!) 134/98 130/79 129/80 117/67  Pulse: (!) 107 (!) 107 (!) 112 75  Resp: 18 18 19 17   Temp: (!) 97.5 F (36.4 C) (!) 97.5 F (36.4 C) 98.9 F (37.2 C) (!) 97.4 F (36.3 C)  TempSrc:   Oral Oral  SpO2: 96% 99% 100% 100%  Weight:      Height:       Body mass index is 27.98 kg/m.   Physical Exam Constitutional:      Appearance: Normal appearance.  Cardiovascular:     Rate and Rhythm: Normal rate.     Heart sounds: No murmur heard. Pulmonary:     Effort: No respiratory distress.     Breath sounds: Normal breath sounds.  Abdominal:     General: Bowel sounds are normal. There is no distension.  Musculoskeletal:     Left knee: Swelling and effusion present. Decreased range of motion. Tenderness present.  Skin:    General: Skin is warm and dry.     Capillary Refill: Capillary refill takes less than 2 seconds.     Findings: No rash.  Neurological:     Mental Status: He is alert and oriented to person, place, and time.      Lab Results Lab Results  Component Value Date   WBC 15.3 (H) 09/29/2023   HGB 12.2 (L) 09/29/2023   HCT 37.6 (L) 09/29/2023   MCV 94.5 09/29/2023   PLT 248 09/29/2023    Lab Results  Component Value Date   CREATININE 1.17 09/29/2023   BUN 19 09/29/2023   NA 139 09/29/2023   K 3.8 09/29/2023   CL 100 09/29/2023   CO2 25 09/29/2023    Lab Results  Component Value Date   ALT 52 (H)  09/22/2023   AST 53 (H) 09/22/2023   ALKPHOS 70 09/22/2023   BILITOT 0.9 09/22/2023     Microbiology: Recent Results (from the past 240 hour(s))  Urine  Culture (for pregnant, neutropenic or urologic patients or patients with an indwelling urinary catheter)     Status: Abnormal   Collection Time: 09/20/23  9:47 PM   Specimen: Urine, Clean Catch  Result Value Ref Range Status   Specimen Description URINE, CLEAN CATCH  Final   Special Requests   Final    NONE Performed at [facility], [address], [place], [place] [address]    Culture 60,000 COLONIES/mL STAPHYLOCOCCUS AUREUS (A)  Final   Report Status 09/23/2023 FINAL  Final   Organism ID, Bacteria STAPHYLOCOCCUS AUREUS (A)  Final      Susceptibility   Staphylococcus aureus - MIC*    CIPROFLOXACIN <=0.5 SENSITIVE Sensitive     GENTAMICIN <=0.5 SENSITIVE Sensitive     NITROFURANTOIN <=16 SENSITIVE Sensitive     OXACILLIN <=0.25 SENSITIVE Sensitive     TETRACYCLINE <=1 SENSITIVE Sensitive     VANCOMYCIN <=0.5 SENSITIVE Sensitive     TRIMETH/SULFA <=10 SENSITIVE Sensitive     CLINDAMYCIN <=0.25 SENSITIVE Sensitive     RIFAMPIN <=0.5 SENSITIVE Sensitive     Inducible Clindamycin NEGATIVE Sensitive     LINEZOLID 2 SENSITIVE Sensitive     * 60,000 COLONIES/mL STAPHYLOCOCCUS AUREUS  Culture, blood (Routine X 2) w Reflex to ID Panel     Status: Abnormal   Collection Time: 09/20/23 10:54 PM   Specimen: BLOOD RIGHT ARM  Result Value Ref Range Status   Specimen Description BLOOD RIGHT ARM  Final   Special Requests   Final    BOTTLES DRAWN AEROBIC AND ANAEROBIC Blood Culture adequate volume   Culture  Setup Time   Final    GRAM POSITIVE COCCI IN CLUSTERS IN BOTH AEROBIC AND ANAEROBIC BOTTLES CRITICAL VALUE NOTED.  VALUE IS CONSISTENT WITH PREVIOUSLY REPORTED AND CALLED VALUE.    Culture (A)  Final    STAPHYLOCOCCUS AUREUS SUSCEPTIBILITIES PERFORMED ON PREVIOUS CULTURE WITHIN THE LAST 5 DAYS. Performed at [facility], [address] [place], [place] [address]    Report Status 09/23/2023 FINAL  Final  Culture, blood (Routine X 2) w Reflex to ID Panel     Status: Abnormal   Collection Time: 09/20/23 10:56 PM   Specimen: BLOOD RIGHT HAND  Result Value Ref Range Status   Specimen Description BLOOD RIGHT HAND  Final   Special Requests   Final    BOTTLES DRAWN AEROBIC AND ANAEROBIC Blood Culture adequate volume   Culture  Setup Time   Final    GRAM POSITIVE COCCI IN CLUSTERS IN BOTH AEROBIC AND ANAEROBIC BOTTLES CRITICAL RESULT CALLED TO, READ BACK BY AND VERIFIED WITHSalina April PHARMD, AT 1144 09/21/23 Performed at [facility], [address], [place], [place] [address]    Culture STAPHYLOCOCCUS AUREUS (A)  Final   Report Status 09/23/2023 FINAL  Final   Organism ID, Bacteria STAPHYLOCOCCUS AUREUS  Final      Susceptibility   Staphylococcus aureus - MIC*    CIPROFLOXACIN <=0.5 SENSITIVE Sensitive     ERYTHROMYCIN <=0.25 SENSITIVE Sensitive     GENTAMICIN <=0.5 SENSITIVE Sensitive     OXACILLIN 0.5 SENSITIVE Sensitive     TETRACYCLINE <=1 SENSITIVE Sensitive     VANCOMYCIN <=0.5 SENSITIVE Sensitive     TRIMETH/SULFA <=10 SENSITIVE Sensitive     CLINDAMYCIN <=0.25 SENSITIVE Sensitive     RIFAMPIN <=0.5 SENSITIVE Sensitive     Inducible Clindamycin NEGATIVE Sensitive     LINEZOLID 2 SENSITIVE Sensitive     *  STAPHYLOCOCCUS AUREUS  Blood Culture ID Panel (Reflexed)     Status: Abnormal   Collection Time: 09/20/23 10:56 PM  Result Value Ref Range Status   Enterococcus faecalis NOT DETECTED NOT DETECTED Final   Enterococcus Faecium NOT DETECTED NOT DETECTED Final   Listeria monocytogenes NOT DETECTED NOT DETECTED Final   Staphylococcus species DETECTED (A) NOT DETECTED Final    Comment: CRITICAL RESULT CALLED TO, READ BACK BY AND VERIFIED WITH: A. UTOMWEN PHARMD, AT 1144 09/21/23    Staphylococcus aureus (BCID) DETECTED (A) NOT DETECTED Final    Comment: CRITICAL RESULT CALLED TO,  READ BACK BY AND VERIFIED WITH: A. UTOMWEN PHARMD, AT 1144 09/21/23    Staphylococcus epidermidis NOT DETECTED NOT DETECTED Final   Staphylococcus lugdunensis NOT DETECTED NOT DETECTED Final   Streptococcus species NOT DETECTED NOT DETECTED Final   Streptococcus agalactiae NOT DETECTED NOT DETECTED Final   Streptococcus pneumoniae NOT DETECTED NOT DETECTED Final   Streptococcus pyogenes NOT DETECTED NOT DETECTED Final   A.calcoaceticus-baumannii NOT DETECTED NOT DETECTED Final   Bacteroides fragilis NOT DETECTED NOT DETECTED Final   Enterobacterales NOT DETECTED NOT DETECTED Final   Enterobacter cloacae complex NOT DETECTED NOT DETECTED Final   Escherichia coli NOT DETECTED NOT DETECTED Final   Klebsiella aerogenes NOT DETECTED NOT DETECTED Final   Klebsiella oxytoca NOT DETECTED NOT DETECTED Final   Klebsiella pneumoniae NOT DETECTED NOT DETECTED Final   Proteus species NOT DETECTED NOT DETECTED Final   Salmonella species NOT DETECTED NOT DETECTED Final   Serratia marcescens NOT DETECTED NOT DETECTED Final   Haemophilus influenzae NOT DETECTED NOT DETECTED Final   Neisseria meningitidis NOT DETECTED NOT DETECTED Final   Pseudomonas aeruginosa NOT DETECTED NOT DETECTED Final   Stenotrophomonas maltophilia NOT DETECTED NOT DETECTED Final   Candida albicans NOT DETECTED NOT DETECTED Final   Candida auris NOT DETECTED NOT DETECTED Final   Candida glabrata NOT DETECTED NOT DETECTED Final   Candida krusei NOT DETECTED NOT DETECTED Final   Candida parapsilosis NOT DETECTED NOT DETECTED Final   Candida tropicalis NOT DETECTED NOT DETECTED Final   Cryptococcus neoformans/gattii NOT DETECTED NOT DETECTED Final   Meth resistant mecA/C and MREJ NOT DETECTED NOT DETECTED Final    Comment: Performed at [facility], [address] [place], [place] [address]  MRSA Next Gen by PCR, Nasal     Status: None   Collection Time: 09/21/23  7:12 AM   Specimen: Nasal Mucosa; Nasal Swab  Result  Value Ref Range Status   MRSA by PCR Next Gen NOT DETECTED NOT DETECTED Final    Comment: (NOTE) The GeneXpert MRSA Assay (FDA approved for NASAL specimens only), is one component of a comprehensive MRSA colonization surveillance program. It is not intended to diagnose MRSA infection nor to guide or monitor treatment for MRSA infections. Test performance is not FDA approved in patients less than 42 years old. Performed at [facility], [address] [place], [place] [address]   Culture, blood (Routine X 2) w Reflex to ID Panel     Status: None   Collection Time: 09/22/23  6:20 AM   Specimen: BLOOD RIGHT HAND  Result Value Ref Range Status   Specimen Description BLOOD RIGHT HAND  Final   Special Requests   Final    BOTTLES DRAWN AEROBIC AND ANAEROBIC Blood Culture results may not be optimal due to an excessive volume of blood received in culture bottles   Culture   Final  NO GROWTH 5 DAYS Performed at [facility], [address], [place], [place] [address]    Report Status 09/27/2023 FINAL  Final  Culture, blood (Routine X 2) w Reflex to ID Panel     Status: None   Collection Time: 09/22/23  6:25 AM   Specimen: BLOOD LEFT HAND  Result Value Ref Range Status   Specimen Description BLOOD LEFT HAND  Final   Special Requests   Final    BOTTLES DRAWN AEROBIC AND ANAEROBIC Blood Culture results may not be optimal due to an excessive volume of blood received in culture bottles   Culture   Final    NO GROWTH 5 DAYS Performed at [facility], [address] [place], [place] [address]    Report Status 09/27/2023 FINAL  Final  Body fluid culture w Gram Stain     Status: None   Collection Time: 09/23/23  2:20 PM   Specimen: Synovium; Body Fluid  Result Value Ref Range Status   Specimen Description SYNOVIAL LEFT KNEE  Final   Special Requests NONE  Final   Gram Stain   Final    ABUNDANT WBC PRESENT, PREDOMINANTLY PMN FEW GRAM POSITIVE COCCI IN PAIRS CRITICAL  RESULT CALLED TO, READ BACK BY AND VERIFIED WITH: RN ALEXIS MORGAN ON 09/23/23 @ 1909 BY DRT Performed at [facility], [address], [place], [place] [address]    Culture MODERATE STAPHYLOCOCCUS AUREUS  Final   Report Status 09/25/2023 FINAL  Final   Organism ID, Bacteria STAPHYLOCOCCUS AUREUS  Final      Susceptibility   Staphylococcus aureus - MIC*    CIPROFLOXACIN <=0.5 SENSITIVE Sensitive     ERYTHROMYCIN <=0.25 SENSITIVE Sensitive     GENTAMICIN <=0.5 SENSITIVE Sensitive     OXACILLIN 0.5 SENSITIVE Sensitive     TETRACYCLINE <=1 SENSITIVE Sensitive     VANCOMYCIN <=0.5 SENSITIVE Sensitive     TRIMETH/SULFA <=10 SENSITIVE Sensitive     CLINDAMYCIN <=0.25 SENSITIVE Sensitive     RIFAMPIN <=0.5 SENSITIVE Sensitive     Inducible Clindamycin NEGATIVE Sensitive     LINEZOLID 2 SENSITIVE Sensitive     * MODERATE STAPHYLOCOCCUS AUREUS  Aerobic/Anaerobic Culture w Gram Stain (surgical/deep wound)     Status: None (Preliminary result)   Collection Time: 09/28/23  5:00 PM   Specimen: Path fluid; Body Fluid  Result Value Ref Range Status   Specimen Description FLUID  Final   Special Requests left knee infection  Final   Gram Stain   Final    RARE WBC PRESENT, PREDOMINANTLY PMN NO ORGANISMS SEEN    Culture   Final    CULTURE REINCUBATED FOR BETTER GROWTH Performed at [facility], [address] [place], [place] [address]    Report Status PENDING  Incomplete   Mathew Alberts, MSN, NP-C [facility]  [email or web address] Pager: [phone] Office: [phone] [facility] Main Line: [phone] *Secure Chat Communication Welcome

## 2023-09-29 NOTE — Progress Notes (Signed)
   Subjective: 1 Day Post-Op Procedure(s) (LRB): IRRIGATION AND DEBRIDEMENT KNEE WITH POLY EXCHANGE (Left) Patient groggy/sleeping this AM. Difficult to arouse. Per RN, had difficulty controlling pain after coming up from PACU last night. Has been sleeping since night time meds with opioids were given.   Objective: Vital signs in last 24 hours: Temp:  [97.5 F (36.4 C)-98.9 F (37.2 C)] 98.9 F (37.2 C) (10/08 0751) Pulse Rate:  [80-112] 112 (10/08 0751) Resp:  [12-22] 19 (10/08 0751) BP: (129-168)/(78-98) 129/80 (10/08 0751) SpO2:  [92 %-100 %] 100 % (10/08 0751)  Intake/Output from previous day:  Intake/Output Summary (Last 24 hours) at 09/29/2023 0808 Last data filed at 09/29/2023 0649 Gross per 24 hour  Intake 2138.56 ml  Output 450 ml  Net 1688.56 ml    Intake/Output this shift: No intake/output data recorded.  Labs: Recent Labs    09/27/23 0550 09/29/23 0505  HGB 11.5* 12.2*   Recent Labs    09/27/23 0550 09/29/23 0505  WBC 9.4 15.3*  RBC 3.69* 3.98*  HCT 33.9* 37.6*  PLT 181 248   Recent Labs    09/27/23 0550 09/29/23 0505  NA 141 139  K 3.8 3.8  CL 100 100  CO2 25 25  BUN 16 19  CREATININE 0.90 1.17  GLUCOSE 145* 189*  CALCIUM 7.9* 7.9*   No results for input(s): "LABPT", "INR" in the last 72 hours.  Exam: Extremity - Neurologically intact Neurovascular intact Sensation intact distally Dorsiflexion/Plantar flexion intact Dressing/Incision - clean, dry, no drainage Motor Function - intact, moving foot and toes well on exam.   Past Medical History:  Diagnosis Date   Arthritis    back,   DJD (degenerative joint disease)    Dysrhythmia    A-fib   Glaucoma    Hypertension     Assessment/Plan: 1 Day Post-Op Procedure(s) (LRB): IRRIGATION AND DEBRIDEMENT KNEE WITH POLY EXCHANGE (Left) Principal Problem:   Prosthetic joint infection (HCC) Active Problems:   Essential hypertension   Chronic atrial fibrillation (HCC)   Bleeding in  head following injury with loss of consciousness (HCC)   Subdural hematoma (HCC)   DM2 (diabetes mellitus, type 2) (HCC)   Neck injury   Fall   Scalp laceration   Bradycardia   Protein-calorie malnutrition, moderate (HCC)   SDH (subdural hematoma) (HCC)   Staphylococcus aureus bacteremia   Effusion of left knee  Estimated body mass index is 27.98 kg/m as calculated from the following:   Height as of this encounter: 5\' 10"  (1.778 m).   Weight as of this encounter: 88.5 kg. Up with therapy  DVT Prophylaxis -  Eliquis restarting today Weight-bearing as tolerated Probable PICC line, will await ID recs  Arther Abbott, PA-C Orthopedic Surgery [phone] 09/29/2023, 8:08 AM

## 2023-09-29 NOTE — Progress Notes (Signed)
Antimicrobial Stewardship Pharmacist Note  Attempted to clarify the patient's penicillin allergy with the patient. The patient is very difficult to arouse and converse with but was able to get out that it was in his 20's and sent him to the hospital. He did not recall it being associated with SOB, throat swelling, or a severe skin and soft tissue reaction. He told me he was not willing to try another penicillin at this time.   Per chart review, he appears to have gotten a script for Keflex in Aug '23 - which he does not remember taking but is a poor historian.   The patient is currently receiving IV Cefazolin however may eventually need oral suppression and the team would like to have a narrow beta-lactam option for him. Given >50 years since his proposed reaction - the team would like to proceed with a 1x dose of cephalexin and monitoring while he is inpatient to see how he does.   Plan - Oral cephalexin 500 mg x 1 dose - Vital checks q15 min for 1 hour after the dose - Prn benadryl and epi-pen only if needed - Will monitor tolerance  - Plan discussed with nursing staff  Thank you for allowing pharmacy to be a part of this patient's care.  Georgina Pillion, PharmD, BCPS, BCIDP Infectious Diseases Clinical Pharmacist 09/29/2023 3:32 PM   **Pharmacist phone directory can now be found on [email or web address] ([email or web address]).  Listed under [facility].

## 2023-09-29 NOTE — Progress Notes (Signed)
PROGRESS NOTE    Mathew Simpson  ZOX:[id number] DOB: [birth year] DOA: 09/08/2023 PCP: [name], PA-C    Brief Narrative:   Mathew Simpson is a 87 y.o. male with past medical history of  persistent atrial fibrillation on Eliquis, HTN, type 2 diabetes mellitus, vertigo  presented to the [facility] ED on 09/08/23 via EMS from home after mechanical fall at home, might have fallen backwards with loss of consciousness.  Found by neighbor.  Patient was brought in by EMS and was having dizziness nausea vomiting.  Patient was on Eliquis as outpatient.  In the ED, patient had stable vitals.  Labs showed creatinine of 1.3.  Alcohol level less than 10.  Procalcitonin less than 0.10.  CK 75.  Chest x-ray with low lung volumes.  Pelvis x-ray negative.  CT head and C-spine without contrast with 3 mm subdural hematoma anterior falx, no midline shift, 6 mm hemorrhagic contusion posterior left frontal lobe, asymmetric widening of the space between the dens and left lateral mass of the C1 relative the right lateral mass. Patient was seen by neurosurgery. Follow-up CT scans demonstrated stable appearance small anterior falcine subdural and left frontal contusion does not require any acute intervention. Dr. Conchita Paris; with neurosurgery recommended  holding Eliquis for 2 weeks, no requirement of cervical immobilization and outpatient follow-up in neurosurg clinic as needed.    During hospitalization, patient was noted to have left knee pain swelling and effusion.  Arthrocentesis showed MSSA septic arthritis.  At this time patient has undergone surgical intervention by orthopedics with irrigation and debridement and poly exchange on 09/28/2023.  Eliquis will be started after surgery.  Ultimate plan skilled nursing facility when okay with ID and orthopedics..  Assessment & Plan:   Subdural hematoma Traumatic brain injury Left frontal contusion  Scalp laceration Mechanical fall  Neurosurgery followed the patient during  hospitalization.  Patient is undergoing conservative treatment at this time.  Recommendation of holding Eliquis for 2 weeks, no requirement of cervical immobilization and outpatient follow-up in neurosurg clinic as needed.  Staples removed on 09/18/2023.  CT head 09/20/23 with interval decrease in subdural hematoma.   Left knee pain, effusion secondary to MSSA septic arthritis.  Status post arthrocentesis by orthopedics on 09/23/2023 with cultures showing methicillin sensitive Staph aureus.  On cefazolin.  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  Status post irrigation and debridement of the knee with poly exchange as per orthopedics on 09/28/2023.  Orthopedics okay with restarting Eliquis tonight.  Follow knee joint cultures.  MSSA Bacteremia  Urinalysis negative.  Chest x-ray without any infiltrate.    CT scan of the chest abdomen pelvis without any acute findings.  TTE with LVEF 55-60%, RV systolic function mildly decreased, biatrial enlargement,  no notation of valvular vegetation although technically difficult study.  ID was consulted.  Blood cultures from 09/20/2023 with MSSA, repeat blood cultures from 09/22/2023 negative in 5 days.  Procalcitonin elevated at 5.5 but trending down. MRSA PCR negative.  Left knee aspiration with MSSA.  Currently on Cefazolin 2 g IV every 8 hours.  Continue supportive care.  Status post irrigation and debridement on 09/28/2023. Mild leukocytosis persists.  Persistent atrial fibrillation with sinus pauses Seen by cardiology during hospitalization.  2D echocardiogram with LVEF 65 percent, normal RV function, mild biatrial enlargement, mild AI, dilated aortic root measuring 43 mm.  No indication for permanent pacemaker placement.  Cardizem dose has been decreased to 120 mg daily. Recommended Zio patch for 2 weeks  on discharge.  Mildly tachycardic after surgery but overall rate has been controlled.  Essential hypertension Continue diltiazem and Irbesartan.   Blood pressure seems stable   Type 2 diabetes mellitus with hyperglycemia Latest Hemoglobin A1c 6.7.  Continue sliding scale insulin.  Glycemic control adequate.  Latest POC glucose of 219.  Moderate protein calorie malnutrition Body mass index is 27.98 kg/m. Etiology: social / environmental circumstances Signs/Symptoms: mild fat depletion, mild muscle depletion Interventions: Ensure Enlive (each supplement provides 350kcal and 20 grams of protein), MVI Seen by dietitian.  Continue nutritional supplementation.  Hypokalemia.  Improved after replacement.  Latest potassium of 3.8.  Check CBC BMP in AM.  Hospital-acquired delirium Delirium precautions, Continue melatonin and Seroquel.  Appears to be more somnolent today.  Required sedation during surgery and is on narcotics.  Weakness/debility/deconditioning/gait disturbance: Plan for nursing facility placement.  DVT prophylaxis: apixaban (ELIQUIS) tablet 2.5 mg Start: 09/29/23 0800 SCDs Start: 09/28/23 1945 Place TED hose Start: 09/28/23 1945 SCDs Start: 09/08/23 2101 apixaban (ELIQUIS) tablet 2.5 mg    Code Status: Full Code  Family Communication:   Spoke with the patient's daughter Ms. Mathew Simpson  on 09/29/2023 and updated her about the clinical condition of the patient. . Disposition Plan:  Level of care: Telemetry Medical Status is: Inpatient  Remains inpatient appropriate because: MSSA bacteremia, left knee joint septic arthritis needing irrigation and debridement, needing SNF placement   Consultants:  Neurosurgery, Dr. Conchita Paris Cardiology Infectious disease Orthopedics  Procedures:  TTE Arthrocentesis 09-23-23 Irrigation and debridement, left knee with polyethylene liner exchange on 09/28/23 with orthopedics.   Antimicrobials:  Cefazolin 9/30>>  Subjective: Today, patient was seen and examined at bedside.  Patient appears to be little sleepy today after sedation and required IV narcotics.  Objective: Vitals:    09/28/23 2006 09/29/23 0022 09/29/23 0400 09/29/23 0751  BP: (!) 163/93 (!) 134/98 130/79 129/80  Pulse: (!) 106 (!) 107 (!) 107 (!) 112  Resp: 18 18 18 19   Temp: 98 F (36.7 C) (!) 97.5 F (36.4 C) (!) 97.5 F (36.4 C) 98.9 F (37.2 C)  TempSrc:    Oral  SpO2: 99% 96% 99% 100%  Weight:      Height:        Intake/Output Summary (Last 24 hours) at 09/29/2023 1104 Last data filed at 09/29/2023 1000 Gross per 24 hour  Intake 2067.32 ml  Output 900 ml  Net 1167.32 ml   Filed Weights   09/08/23 1601  Weight: 88.5 kg    Physical examination: Body mass index is 27.98 kg/m.  General: Average built, chronically ill, elderly male, hard of hearing, somnolent HENT:   No scleral pallor or icterus noted. Oral mucosa is moist.   Chest:   Diminished breath sounds bilaterally. No crackles or wheezes.  CVS: S1 &S2 heard. No murmur.  Regular rate and rhythm. Abdomen: Soft, nontender, nondistended.  Bowel sounds are heard.   Extremities: No cyanosis, clubbing.  Peripheral pulses are palpable.  Left knee status post surgery.  Moves lower extremity. Psych: Mildly somnolent, Communicative when awake, slightly confused. CNS:  No cranial nerve deficits.  Restricted left knee movement. Skin: Warm and dry.    Data Reviewed: I have personally reviewed the following labs and imaging studies.     CBC: Recent Labs  Lab 09/23/23 0503 09/25/23 0637 09/26/23 0357 09/27/23 0550 09/29/23 0505  WBC 6.9 8.6 8.1 9.4 15.3*  HGB 11.6* 12.2* 11.7* 11.5* 12.2*  HCT 35.5* 36.8* 35.4* 33.9* 37.6*  MCV 90.3 90.9  90.5 91.9 94.5  PLT 98* 130* 150 181 248   Basic Metabolic Panel: Recent Labs  Lab 09/23/23 0503 09/25/23 0637 09/26/23 0357 09/27/23 0550 09/29/23 0505  NA 141 141 141 141 139  K 3.2* 3.2* 3.2* 3.8 3.8  CL 113* 104 105 100 100  CO2 19* 25 24 25 25   GLUCOSE 163* 149* 149* 145* 189*  BUN 27* 18 19 16 19   CREATININE 1.05 0.94 0.96 0.90 1.17  CALCIUM 7.9* 7.9* 7.8* 7.9* 7.9*  MG  --   2.2 2.1  --  2.0   GFR: Estimated Creatinine Clearance: 51.7 mL/min (by C-G formula based on SCr of 1.17 mg/dL). Liver Function Tests: No results for input(s): "AST", "ALT", "ALKPHOS", "BILITOT", "PROT", "ALBUMIN" in the last 168 hours.  No results for input(s): "LIPASE", "AMYLASE" in the last 168 hours. No results for input(s): "AMMONIA" in the last 168 hours. Coagulation Profile: No results for input(s): "INR", "PROTIME" in the last 168 hours. Cardiac Enzymes: No results for input(s): "CKTOTAL", "CKMB", "CKMBINDEX", "TROPONINI" in the last 168 hours. BNP (last 3 results) No results for input(s): "PROBNP" in the last 8760 hours. HbA1C: No results for input(s): "HGBA1C" in the last 72 hours. CBG: Recent Labs  Lab 09/28/23 1228 09/28/23 1508 09/28/23 1850 09/28/23 2105 09/29/23 0617  GLUCAP 138* 132* 146* 167* 219*   Lipid Profile: No results for input(s): "CHOL", "HDL", "LDLCALC", "TRIG", "CHOLHDL", "LDLDIRECT" in the last 72 hours. Thyroid Function Tests: No results for input(s): "TSH", "T4TOTAL", "FREET4", "T3FREE", "THYROIDAB" in the last 72 hours.  Anemia Panel: No results for input(s): "VITAMINB12", "FOLATE", "FERRITIN", "TIBC", "IRON", "RETICCTPCT" in the last 72 hours. Sepsis Labs: Recent Labs  Lab 09/23/23 0503  PROCALCITON 5.05    Recent Results (from the past 240 hour(s))  Urine Culture (for pregnant, neutropenic or urologic patients or patients with an indwelling urinary catheter)     Status: Abnormal   Collection Time: 09/20/23  9:47 PM   Specimen: Urine, Clean Catch  Result Value Ref Range Status   Specimen Description URINE, CLEAN CATCH  Final   Special Requests   Final    NONE Performed at [facility], [address], [place], [place] [address]    Culture 60,000 COLONIES/mL STAPHYLOCOCCUS AUREUS (A)  Final   Report Status 09/23/2023 FINAL  Final   Organism ID, Bacteria STAPHYLOCOCCUS AUREUS (A)  Final      Susceptibility   Staphylococcus  aureus - MIC*    CIPROFLOXACIN <=0.5 SENSITIVE Sensitive     GENTAMICIN <=0.5 SENSITIVE Sensitive     NITROFURANTOIN <=16 SENSITIVE Sensitive     OXACILLIN <=0.25 SENSITIVE Sensitive     TETRACYCLINE <=1 SENSITIVE Sensitive     VANCOMYCIN <=0.5 SENSITIVE Sensitive     TRIMETH/SULFA <=10 SENSITIVE Sensitive     CLINDAMYCIN <=0.25 SENSITIVE Sensitive     RIFAMPIN <=0.5 SENSITIVE Sensitive     Inducible Clindamycin NEGATIVE Sensitive     LINEZOLID 2 SENSITIVE Sensitive     * 60,000 COLONIES/mL STAPHYLOCOCCUS AUREUS  Culture, blood (Routine X 2) w Reflex to ID Panel     Status: Abnormal   Collection Time: 09/20/23 10:54 PM   Specimen: BLOOD RIGHT ARM  Result Value Ref Range Status   Specimen Description BLOOD RIGHT ARM  Final   Special Requests   Final    BOTTLES DRAWN AEROBIC AND ANAEROBIC Blood Culture adequate volume   Culture  Setup Time   Final    GRAM POSITIVE COCCI IN CLUSTERS IN BOTH  AEROBIC AND ANAEROBIC BOTTLES CRITICAL VALUE NOTED.  VALUE IS CONSISTENT WITH PREVIOUSLY REPORTED AND CALLED VALUE.    Culture (A)  Final    STAPHYLOCOCCUS AUREUS SUSCEPTIBILITIES PERFORMED ON PREVIOUS CULTURE WITHIN THE LAST 5 DAYS. Performed at [facility], [address] [place], [place] [address]    Report Status 09/23/2023 FINAL  Final  Culture, blood (Routine X 2) w Reflex to ID Panel     Status: Abnormal   Collection Time: 09/20/23 10:56 PM   Specimen: BLOOD RIGHT HAND  Result Value Ref Range Status   Specimen Description BLOOD RIGHT HAND  Final   Special Requests   Final    BOTTLES DRAWN AEROBIC AND ANAEROBIC Blood Culture adequate volume   Culture  Setup Time   Final    GRAM POSITIVE COCCI IN CLUSTERS IN BOTH AEROBIC AND ANAEROBIC BOTTLES CRITICAL RESULT CALLED TO, READ BACK BY AND VERIFIED WITHSalina April PHARMD, AT 1144 09/21/23 Performed at [facility], [address] [place], [place] [address]    Culture STAPHYLOCOCCUS AUREUS (A)  Final   Report Status  09/23/2023 FINAL  Final   Organism ID, Bacteria STAPHYLOCOCCUS AUREUS  Final      Susceptibility   Staphylococcus aureus - MIC*    CIPROFLOXACIN <=0.5 SENSITIVE Sensitive     ERYTHROMYCIN <=0.25 SENSITIVE Sensitive     GENTAMICIN <=0.5 SENSITIVE Sensitive     OXACILLIN 0.5 SENSITIVE Sensitive     TETRACYCLINE <=1 SENSITIVE Sensitive     VANCOMYCIN <=0.5 SENSITIVE Sensitive     TRIMETH/SULFA <=10 SENSITIVE Sensitive     CLINDAMYCIN <=0.25 SENSITIVE Sensitive     RIFAMPIN <=0.5 SENSITIVE Sensitive     Inducible Clindamycin NEGATIVE Sensitive     LINEZOLID 2 SENSITIVE Sensitive     * STAPHYLOCOCCUS AUREUS  Blood Culture ID Panel (Reflexed)     Status: Abnormal   Collection Time: 09/20/23 10:56 PM  Result Value Ref Range Status   Enterococcus faecalis NOT DETECTED NOT DETECTED Final   Enterococcus Faecium NOT DETECTED NOT DETECTED Final   Listeria monocytogenes NOT DETECTED NOT DETECTED Final   Staphylococcus species DETECTED (A) NOT DETECTED Final    Comment: CRITICAL RESULT CALLED TO, READ BACK BY AND VERIFIED WITH: A. UTOMWEN PHARMD, AT 1144 09/21/23    Staphylococcus aureus (BCID) DETECTED (A) NOT DETECTED Final    Comment: CRITICAL RESULT CALLED TO, READ BACK BY AND VERIFIED WITH: A. UTOMWEN PHARMD, AT 1144 09/21/23    Staphylococcus epidermidis NOT DETECTED NOT DETECTED Final   Staphylococcus lugdunensis NOT DETECTED NOT DETECTED Final   Streptococcus species NOT DETECTED NOT DETECTED Final   Streptococcus agalactiae NOT DETECTED NOT DETECTED Final   Streptococcus pneumoniae NOT DETECTED NOT DETECTED Final   Streptococcus pyogenes NOT DETECTED NOT DETECTED Final   A.calcoaceticus-baumannii NOT DETECTED NOT DETECTED Final   Bacteroides fragilis NOT DETECTED NOT DETECTED Final   Enterobacterales NOT DETECTED NOT DETECTED Final   Enterobacter cloacae complex NOT DETECTED NOT DETECTED Final   Escherichia coli NOT DETECTED NOT DETECTED Final   Klebsiella aerogenes NOT DETECTED NOT  DETECTED Final   Klebsiella oxytoca NOT DETECTED NOT DETECTED Final   Klebsiella pneumoniae NOT DETECTED NOT DETECTED Final   Proteus species NOT DETECTED NOT DETECTED Final   Salmonella species NOT DETECTED NOT DETECTED Final   Serratia marcescens NOT DETECTED NOT DETECTED Final   Haemophilus influenzae NOT DETECTED NOT DETECTED Final   Neisseria meningitidis NOT DETECTED NOT DETECTED Final   Pseudomonas aeruginosa NOT DETECTED  NOT DETECTED Final   Stenotrophomonas maltophilia NOT DETECTED NOT DETECTED Final   Candida albicans NOT DETECTED NOT DETECTED Final   Candida auris NOT DETECTED NOT DETECTED Final   Candida glabrata NOT DETECTED NOT DETECTED Final   Candida krusei NOT DETECTED NOT DETECTED Final   Candida parapsilosis NOT DETECTED NOT DETECTED Final   Candida tropicalis NOT DETECTED NOT DETECTED Final   Cryptococcus neoformans/gattii NOT DETECTED NOT DETECTED Final   Meth resistant mecA/C and MREJ NOT DETECTED NOT DETECTED Final    Comment: Performed at [facility], [address] [place], [place] [address]  MRSA Next Gen by PCR, Nasal     Status: None   Collection Time: 09/21/23  7:12 AM   Specimen: Nasal Mucosa; Nasal Swab  Result Value Ref Range Status   MRSA by PCR Next Gen NOT DETECTED NOT DETECTED Final    Comment: (NOTE) The GeneXpert MRSA Assay (FDA approved for NASAL specimens only), is one component of a comprehensive MRSA colonization surveillance program. It is not intended to diagnose MRSA infection nor to guide or monitor treatment for MRSA infections. Test performance is not FDA approved in patients less than 80 years old. Performed at [facility], [address] [place], [place] [address]   Culture, blood (Routine X 2) w Reflex to ID Panel     Status: None   Collection Time: 09/22/23  6:20 AM   Specimen: BLOOD RIGHT HAND  Result Value Ref Range Status   Specimen Description BLOOD RIGHT HAND  Final   Special Requests   Final    BOTTLES  DRAWN AEROBIC AND ANAEROBIC Blood Culture results may not be optimal due to an excessive volume of blood received in culture bottles   Culture   Final    NO GROWTH 5 DAYS Performed at [facility], [address], [place], [place] [address]    Report Status 09/27/2023 FINAL  Final  Culture, blood (Routine X 2) w Reflex to ID Panel     Status: None   Collection Time: 09/22/23  6:25 AM   Specimen: BLOOD LEFT HAND  Result Value Ref Range Status   Specimen Description BLOOD LEFT HAND  Final   Special Requests   Final    BOTTLES DRAWN AEROBIC AND ANAEROBIC Blood Culture results may not be optimal due to an excessive volume of blood received in culture bottles   Culture   Final    NO GROWTH 5 DAYS Performed at [facility], [address] [place], [place] [address]    Report Status 09/27/2023 FINAL  Final  Body fluid culture w Gram Stain     Status: None   Collection Time: 09/23/23  2:20 PM   Specimen: Synovium; Body Fluid  Result Value Ref Range Status   Specimen Description SYNOVIAL LEFT KNEE  Final   Special Requests NONE  Final   Gram Stain   Final    ABUNDANT WBC PRESENT, PREDOMINANTLY PMN FEW GRAM POSITIVE COCCI IN PAIRS CRITICAL RESULT CALLED TO, READ BACK BY AND VERIFIED WITH: RN ALEXIS MORGAN ON 09/23/23 @ 1909 BY DRT Performed at [facility], [address], [place], [place] [address]    Culture MODERATE STAPHYLOCOCCUS AUREUS  Final   Report Status 09/25/2023 FINAL  Final   Organism ID, Bacteria STAPHYLOCOCCUS AUREUS  Final      Susceptibility   Staphylococcus aureus - MIC*    CIPROFLOXACIN <=0.5 SENSITIVE Sensitive     ERYTHROMYCIN <=  0.25 SENSITIVE Sensitive     GENTAMICIN <=0.5 SENSITIVE Sensitive     OXACILLIN 0.5 SENSITIVE Sensitive     TETRACYCLINE <=1 SENSITIVE Sensitive     VANCOMYCIN <=0.5 SENSITIVE Sensitive     TRIMETH/SULFA <=10 SENSITIVE Sensitive     CLINDAMYCIN <=0.25 SENSITIVE Sensitive     RIFAMPIN <=0.5 SENSITIVE Sensitive      Inducible Clindamycin NEGATIVE Sensitive     LINEZOLID 2 SENSITIVE Sensitive     * MODERATE STAPHYLOCOCCUS AUREUS  Aerobic/Anaerobic Culture w Gram Stain (surgical/deep wound)     Status: None (Preliminary result)   Collection Time: 09/28/23  5:00 PM   Specimen: Path fluid; Body Fluid  Result Value Ref Range Status   Specimen Description FLUID  Final   Special Requests left knee infection  Final   Gram Stain   Final    RARE WBC PRESENT, PREDOMINANTLY PMN NO ORGANISMS SEEN    Culture   Final    CULTURE REINCUBATED FOR BETTER GROWTH Performed at [facility], [address] [place], [place] [address]    Report Status PENDING  Incomplete      Radiology Studies: [place] EKG SITE RITE  Result Date: 09/29/2023 If Site Rite image not attached, placement could not be confirmed due to current cardiac rhythm.    Scheduled Meds:  acetaminophen  1,000 mg Oral Q6H   apixaban  2.5 mg Oral Q12H   diltiazem  120 mg Oral Daily   docusate sodium  100 mg Oral BID   feeding supplement  1 Container Oral TID BM   Gerhardt's butt cream   Topical TID   insulin aspart  0-5 Units Subcutaneous QHS   insulin aspart  0-9 Units Subcutaneous TID WC   irbesartan  300 mg Oral Daily   melatonin  6 mg Oral QHS   metoprolol tartrate  12.5 mg Oral BID   multivitamin with minerals  1 tablet Oral Daily   polyethylene glycol  17 g Oral BID   QUEtiapine  12.5 mg Oral QHS   Continuous Infusions:   ceFAZolin (ANCEF) IV 2 g (09/28/23 1248)    ceFAZolin (ANCEF) IV Stopped (09/29/23 0545)   methocarbamol (ROBAXIN) IV Stopped (09/29/23 0423)      LOS: 12 days    Joycelyn Das, MD [facility] Available via Epic secure chat 7am-7pm After these hours, please refer to coverage provider listed on [email or web address] 09/29/2023, 11:04 AM

## 2023-09-29 NOTE — Progress Notes (Signed)
PHARMACY CONSULT NOTE FOR:  OUTPATIENT  PARENTERAL ANTIBIOTIC THERAPY (OPAT)  Indication: MSSA L-knee PJI Regimen: Cefazolin 2g IV every 8 hours End date: 11/09/23 (6 weeks from OR 09/28/23)  Noted penicillin allergy however the patient tolerated an oral cephalexin challenge on 10/8 and should be able to transition to this for po suppression after IV antibiotic have been completed.   IV antibiotic discharge orders are pended. To discharging provider:  please sign these orders via discharge navigator,  Select New Orders & click on the button choice - Manage This Unsigned Work.     Thank you for allowing pharmacy to be a part of this patient's care.  Georgina Pillion, PharmD, BCPS, BCIDP Infectious Diseases Clinical Pharmacist 09/29/2023 11:20 AM   **Pharmacist phone directory can now be found on [email or web address] ([email or web address]).  Listed under [facility].

## 2023-09-30 DIAGNOSIS — E119 Type 2 diabetes mellitus without complications: Secondary | ICD-10-CM | POA: Diagnosis not present

## 2023-09-30 DIAGNOSIS — R001 Bradycardia, unspecified: Secondary | ICD-10-CM | POA: Diagnosis not present

## 2023-09-30 DIAGNOSIS — T8450XD Infection and inflammatory reaction due to unspecified internal joint prosthesis, subsequent encounter: Secondary | ICD-10-CM

## 2023-09-30 DIAGNOSIS — I482 Chronic atrial fibrillation, unspecified: Secondary | ICD-10-CM | POA: Diagnosis not present

## 2023-09-30 LAB — CBC
HCT: 28.8 % — ABNORMAL LOW (ref 39.0–52.0)
Hemoglobin: 9.6 g/dL — ABNORMAL LOW (ref 13.0–17.0)
MCH: 31.2 pg (ref 26.0–34.0)
MCHC: 33.3 g/dL (ref 30.0–36.0)
MCV: 93.5 fL (ref 80.0–100.0)
Platelets: 216 10*3/uL (ref 150–400)
RBC: 3.08 MIL/uL — ABNORMAL LOW (ref 4.22–5.81)
RDW: 14.3 % (ref 11.5–15.5)
WBC: 10.2 10*3/uL (ref 4.0–10.5)
nRBC: 0 % (ref 0.0–0.2)

## 2023-09-30 LAB — BASIC METABOLIC PANEL
Anion gap: 9 (ref 5–15)
BUN: 21 mg/dL (ref 8–23)
CO2: 29 mmol/L (ref 22–32)
Calcium: 7.6 mg/dL — ABNORMAL LOW (ref 8.9–10.3)
Chloride: 103 mmol/L (ref 98–111)
Creatinine, Ser: 1.09 mg/dL (ref 0.61–1.24)
GFR, Estimated: >60 mL/min (ref >60)
Glucose, Bld: 132 mg/dL — ABNORMAL HIGH (ref 70–99)
Potassium: 3.1 mmol/L — ABNORMAL LOW (ref 3.5–5.1)
Sodium: 141 mmol/L (ref 135–145)

## 2023-09-30 LAB — GLUCOSE, CAPILLARY
Glucose-Capillary: 152 mg/dL — ABNORMAL HIGH (ref 70–99)
Glucose-Capillary: 259 mg/dL — ABNORMAL HIGH (ref 70–99)
Glucose-Capillary: 198 mg/dL — ABNORMAL HIGH (ref 70–99)
Glucose-Capillary: 139 mg/dL — ABNORMAL HIGH (ref 70–99)

## 2023-09-30 LAB — MAGNESIUM: Magnesium: 2.1 mg/dL (ref 1.7–2.4)

## 2023-09-30 MED ORDER — POTASSIUM CHLORIDE CRYS ER 20 MEQ PO TBCR
40.0000 meq | EXTENDED_RELEASE_TABLET | Freq: Once | ORAL | Status: AC
  Administered 2023-09-30: 40 meq via ORAL
  Filled 2023-09-30: qty 2

## 2023-09-30 NOTE — Progress Notes (Signed)
Calorie Count Note  48-hour calorie count ordered.  Pt's intake is very poor and he is not meeting his nutrition needs orally. However, pt has been offered a variety of nutrition supplements and interventions which he has declined or not enjoyed. Pt has a bed to SNF in the AM and will be discharging. RD at SNF to follow-up and adjust interventions as needed. Pt drinking fluids (mainly soda). Will dc kcal count as pt will not be a candidate for enteral nutrition. Will add info to AVS  Diet: Regular diet, thin liquids Supplements: Boost Breeze TID  Estimated Nutritional Needs:  Kcal:  1900-2100 Protein:  95-110g Fluid:  >/=2L  10/8 Lunch: 19 kcal, 2g protein 10/8 Dinner: 9 kcal, 0g protein 10/9 Breakfast: 330 kcal, 9g protein Supplements: 1/2 ensure and 1/2 boost breeze: 300 kcal, 15g protein  Total intake: 658 kcal (35% of minimum estimated needs)  17 protein (18% of minimum estimated needs)  NUTRITION DIAGNOSIS:  Moderate Malnutrition related to social / environmental circumstances as evidenced by mild fat depletion, mild muscle depletion. - remains applicable   GOAL:  Patient will meet greater than or equal to 90% of their needs - goal unmet  INTERVENTION:  Continue current diet, encourage PO intake Continue Boost Breeze po TID, each supplement provides 250 kcal and 9 grams of protein Encourage family to provide food from home that pt may enjoy Magic cup TID with meals, each supplement provides 290 kcal and 9 grams of protein MVI with minerals daily   Greig Castilla, RD, LDN Clinical Dietitian RD pager # available in AMION  After hours/weekend pager # available in [facility]

## 2023-09-30 NOTE — Plan of Care (Signed)
  Problem: Coping: Goal: Ability to adjust to condition or change in health will improve Outcome: Progressing   Problem: Fluid Volume: Goal: Ability to maintain a balanced intake and output will improve Outcome: Progressing   Problem: Metabolic: Goal: Ability to maintain appropriate glucose levels will improve Outcome: Progressing   Problem: Nutritional: Goal: Maintenance of adequate nutrition will improve Outcome: Progressing   Problem: Tissue Perfusion: Goal: Adequacy of tissue perfusion will improve Outcome: Progressing   

## 2023-09-30 NOTE — TOC Progression Note (Addendum)
Transition of Care [facility]) - Progression Note    Patient Details  Name: Mathew Simpson MRN: [id number] Date of Birth: September 25, 1936  Transition of Care [facility]) CM/SW Contact  Baldemar Lenis, [place] Phone Number: 09/30/2023, 10:22 AM  Clinical Narrative:   CSW noting picc has been placed, checked with MD on timeline for medical stability to transition to SNF. Patient approaching medically stable, can continue SNF workup. CSW contacted [facility], confirmed bed is available for patient. CSW requested CMA to initiate insurance authorization. CSW updated granddaughter, [name], via phone. CSW answered questions. CSW to follow.  UPDATE: CSW received insurance approval for SNF placement, per MD can transition to SNF tomorrow pending medical stability. CSW updated [facility] and granddaughter, [name]. CSW to follow.    Expected Discharge Plan: Skilled Nursing Facility Barriers to Discharge: Continued Medical Work up, English as a second language teacher  Expected Discharge Plan and Services     Post Acute Care Choice: Skilled Nursing Facility Living arrangements for the past 2 months: Single Family Home                                       Social Determinants of Health (SDOH) Interventions SDOH Screenings   Food Insecurity: No Food Insecurity (09/10/2023)  Housing: Low Risk  (09/10/2023)  Transportation Needs: No Transportation Needs (09/10/2023)  Utilities: Not At Risk (09/10/2023)  Tobacco Use: Medium Risk (09/28/2023)    Readmission Risk Interventions    07/23/2022   11:40 AM 07/21/2022   10:11 AM  Readmission Risk Prevention Plan  Post Dischage Appt Complete Complete  Medication Screening Complete Complete  Transportation Screening Complete Complete

## 2023-09-30 NOTE — Progress Notes (Signed)
PROGRESS NOTE    Mathew Simpson  RJJ:[id number] DOB: 08-Jul-1936 DOA: 09/08/2023 PCP: [name], PA-C    Brief Narrative:   Mathew Simpson is a 87 y.o. male with past medical history of  persistent atrial fibrillation on Eliquis, HTN, type 2 diabetes mellitus, vertigo  presented to the [facility] ED on 09/08/23 via EMS from home after mechanical fall at home, might have fallen backwards with loss of consciousness.  Found by neighbor.  Patient was brought in by EMS and was having dizziness nausea vomiting.  Patient was on Eliquis as outpatient.  In the ED, patient had stable vitals.  Labs showed creatinine of 1.3.  Alcohol level less than 10.  Procalcitonin less than 0.10.  CK 75.  Chest x-ray with low lung volumes.  Pelvis x-ray negative.  CT head and C-spine without contrast with 3 mm subdural hematoma anterior falx, no midline shift, 6 mm hemorrhagic contusion posterior left frontal lobe, asymmetric widening of the space between the dens and left lateral mass of the C1 relative the right lateral mass. Patient was seen by neurosurgery. Follow-up CT scans demonstrated stable appearance small anterior falcine subdural and left frontal contusion does not require any acute intervention. Dr. Conchita Paris; with neurosurgery recommended  holding Eliquis for 2 weeks, no requirement of cervical immobilization and outpatient follow-up in neurosurg clinic as needed.    During hospitalization, patient was noted to have left knee pain swelling and effusion.  Arthrocentesis showed MSSA septic arthritis.  At this time patient has undergone surgical intervention by orthopedics with irrigation and debridement and poly exchange on 09/28/2023.  Eliquis will be started after surgery.  Ultimate plan skilled nursing facility when okay with ID and orthopedics..  10/9: Medically stable to go to SNF, asked dietitian to place their recommendations as patient need to be monitor and encourage for proper caloric intake.  PICC line has been  placed.  All  Assessment & Plan:   Subdural hematoma Traumatic brain injury Left frontal contusion  Scalp laceration Mechanical fall  Neurosurgery followed the patient during hospitalization.  Patient is undergoing conservative treatment at this time.  Recommendation of holding Eliquis for 2 weeks, no requirement of cervical immobilization and outpatient follow-up in neurosurg clinic as needed.  Staples removed on 09/18/2023.  CT head 09/20/23 with interval decrease in subdural hematoma.   Left knee pain, effusion secondary to MSSA septic arthritis.  Status post arthrocentesis by orthopedics on 09/23/2023 with cultures showing methicillin sensitive Staph aureus.  On cefazolin.  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  Status post irrigation and debridement of the knee with poly exchange as per orthopedics on 09/28/2023.  Orthopedics okay with restarting Eliquis tonight.  Follow knee joint cultures-no growth so far  MSSA Bacteremia  Urinalysis negative.  Chest x-ray without any infiltrate.    CT scan of the chest abdomen pelvis without any acute findings.  TTE with LVEF 55-60%, RV systolic function mildly decreased, biatrial enlargement,  no notation of valvular vegetation although technically difficult study.  ID was consulted.  Blood cultures from 09/20/2023 with MSSA, repeat blood cultures from 09/22/2023 negative in 5 days.  Procalcitonin elevated at 5.5 but trending down. MRSA PCR negative.  Left knee aspiration with MSSA.  Currently on Cefazolin 2 g IV every 8 hours.  Continue supportive care.  Status post irrigation and debridement on 09/28/2023. Mild leukocytosis persists.  Persistent atrial fibrillation with sinus pauses Seen by cardiology during hospitalization.  2D echocardiogram with LVEF 65 percent,  normal RV function, mild biatrial enlargement, mild AI, dilated aortic root measuring 43 mm.  No indication for permanent pacemaker placement.  Cardizem dose has been  decreased to 120 mg daily. Recommended Zio patch for 2 weeks on discharge.  Mildly tachycardic after surgery but overall rate has been controlled.  Essential hypertension Continue diltiazem and Irbesartan.  Blood pressure seems stable   Type 2 diabetes mellitus with hyperglycemia Latest Hemoglobin A1c 6.7.  Continue sliding scale insulin.  Glycemic control adequate.  Latest POC glucose of 219.  Moderate protein calorie malnutrition Body mass index is 27.71 kg/m. Etiology: social / environmental circumstances Signs/Symptoms: mild fat depletion, mild muscle depletion Interventions: Ensure Enlive (each supplement provides 350kcal and 20 grams of protein), MVI Seen by dietitian.  Continue nutritional supplementation.  Hypokalemia.  Improved after replacement.  Latest potassium of 3.8.  Check CBC BMP in AM.  Hospital-acquired delirium Delirium precautions, Continue melatonin and Seroquel.  Appears to be more somnolent today.  Required sedation during surgery and is on narcotics.  Weakness/debility/deconditioning/gait disturbance: Plan for nursing facility placement.  DVT prophylaxis: apixaban (ELIQUIS) tablet 2.5 mg Start: 09/29/23 0800 SCDs Start: 09/28/23 1945 Place TED hose Start: 09/28/23 1945 SCDs Start: 09/08/23 2101 apixaban (ELIQUIS) tablet 2.5 mg  apixaban (ELIQUIS) tablet 5 mg    Code Status: Full Code  Family Communication:   . Disposition Plan:  Level of care: Telemetry Medical Status is: Inpatient  Remains inpatient appropriate because: MSSA bacteremia, left knee joint septic arthritis needing irrigation and debridement, needing SNF placement   Consultants:  Neurosurgery, Dr. Conchita Paris Cardiology Infectious disease Orthopedics  Procedures:  TTE Arthrocentesis 09-23-23 Irrigation and debridement, left knee with polyethylene liner exchange on 09/28/23 with orthopedics.   Antimicrobials:  Cefazolin 9/30>>  Subjective: Patient was resting comfortably when  seen today.  Denies any pain.  Objective: Vitals:   09/30/23 0500 09/30/23 0731 09/30/23 1044 09/30/23 1514  BP:  (!) 136/57 (!) 136/53 (!) 127/59  Pulse:  100 100 98  Resp:  17 19 19   Temp:  98.3 F (36.8 C) 98.2 F (36.8 C) 98.7 F (37.1 C)  TempSrc:  Oral Oral Oral  SpO2:  95% 94% 93%  Weight: 87.6 kg     Height:        Intake/Output Summary (Last 24 hours) at 09/30/2023 1620 Last data filed at 09/30/2023 1300 Gross per 24 hour  Intake 70 ml  Output 1000 ml  Net -930 ml   Filed Weights   09/08/23 1601 09/30/23 0500  Weight: 88.5 kg 87.6 kg    Physical examination: Body mass index is 27.71 kg/m.   General.  Malnourished elderly man, in no acute distress. Pulmonary.  Lungs clear bilaterally, normal respiratory effort. CV.  Regular rate and rhythm, no JVD, rub or murmur. Abdomen.  Soft, nontender, nondistended, BS positive. CNS.  Alert and oriented .  No focal neurologic deficit. Extremities.  Left lower extremity with trace edema, left knee with bandage.  No edema on right  Data Reviewed: I have personally reviewed the following labs and imaging studies.     CBC: Recent Labs  Lab 09/25/23 0637 09/26/23 0357 09/27/23 0550 09/29/23 0505 09/30/23 0437  WBC 8.6 8.1 9.4 15.3* 10.2  HGB 12.2* 11.7* 11.5* 12.2* 9.6*  HCT 36.8* 35.4* 33.9* 37.6* 28.8*  MCV 90.9 90.5 91.9 94.5 93.5  PLT 130* 150 181 248 216   Basic Metabolic Panel: Recent Labs  Lab 09/25/23 0637 09/26/23 0357 09/27/23 0550 09/29/23 0505 09/30/23 0437  NA 141  141 141 139 141  K 3.2* 3.2* 3.8 3.8 3.1*  CL 104 105 100 100 103  CO2 25 24 25 25 29   GLUCOSE 149* 149* 145* 189* 132*  BUN 18 19 16 19 21   CREATININE 0.94 0.96 0.90 1.17 1.09  CALCIUM 7.9* 7.8* 7.9* 7.9* 7.6*  MG 2.2 2.1  --  2.0 2.1   GFR: Estimated Creatinine Clearance: 55.2 mL/min (by C-G formula based on SCr of 1.09 mg/dL). Liver Function Tests: No results for input(s): "AST", "ALT", "ALKPHOS", "BILITOT", "PROT",  "ALBUMIN" in the last 168 hours.  No results for input(s): "LIPASE", "AMYLASE" in the last 168 hours. No results for input(s): "AMMONIA" in the last 168 hours. Coagulation Profile: No results for input(s): "INR", "PROTIME" in the last 168 hours. Cardiac Enzymes: No results for input(s): "CKTOTAL", "CKMB", "CKMBINDEX", "TROPONINI" in the last 168 hours. BNP (last 3 results) No results for input(s): "PROBNP" in the last 8760 hours. HbA1C: No results for input(s): "HGBA1C" in the last 72 hours. CBG: Recent Labs  Lab 09/29/23 1139 09/29/23 1606 09/29/23 2126 09/30/23 0630 09/30/23 1045  GLUCAP 244* 394* 152* 152* 259*   Lipid Profile: No results for input(s): "CHOL", "HDL", "LDLCALC", "TRIG", "CHOLHDL", "LDLDIRECT" in the last 72 hours. Thyroid Function Tests: No results for input(s): "TSH", "T4TOTAL", "FREET4", "T3FREE", "THYROIDAB" in the last 72 hours.  Anemia Panel: No results for input(s): "VITAMINB12", "FOLATE", "FERRITIN", "TIBC", "IRON", "RETICCTPCT" in the last 72 hours. Sepsis Labs: No results for input(s): "PROCALCITON", "LATICACIDVEN" in the last 168 hours.   Recent Results (from the past 240 hour(s))  Urine Culture (for pregnant, neutropenic or urologic patients or patients with an indwelling urinary catheter)     Status: Abnormal   Collection Time: 09/20/23  9:47 PM   Specimen: Urine, Clean Catch  Result Value Ref Range Status   Specimen Description URINE, CLEAN CATCH  Final   Special Requests   Final    NONE Performed at [facility], [address], [place], [place] [address]    Culture 60,000 COLONIES/mL STAPHYLOCOCCUS AUREUS (A)  Final   Report Status 09/23/2023 FINAL  Final   Organism ID, Bacteria STAPHYLOCOCCUS AUREUS (A)  Final      Susceptibility   Staphylococcus aureus - MIC*    CIPROFLOXACIN <=0.5 SENSITIVE Sensitive     GENTAMICIN <=0.5 SENSITIVE Sensitive     NITROFURANTOIN <=16 SENSITIVE Sensitive     OXACILLIN <=0.25 SENSITIVE Sensitive      TETRACYCLINE <=1 SENSITIVE Sensitive     VANCOMYCIN <=0.5 SENSITIVE Sensitive     TRIMETH/SULFA <=10 SENSITIVE Sensitive     CLINDAMYCIN <=0.25 SENSITIVE Sensitive     RIFAMPIN <=0.5 SENSITIVE Sensitive     Inducible Clindamycin NEGATIVE Sensitive     LINEZOLID 2 SENSITIVE Sensitive     * 60,000 COLONIES/mL STAPHYLOCOCCUS AUREUS  Culture, blood (Routine X 2) w Reflex to ID Panel     Status: Abnormal   Collection Time: 09/20/23 10:54 PM   Specimen: BLOOD RIGHT ARM  Result Value Ref Range Status   Specimen Description BLOOD RIGHT ARM  Final   Special Requests   Final    BOTTLES DRAWN AEROBIC AND ANAEROBIC Blood Culture adequate volume   Culture  Setup Time   Final    GRAM POSITIVE COCCI IN CLUSTERS IN BOTH AEROBIC AND ANAEROBIC BOTTLES CRITICAL VALUE NOTED.  VALUE IS CONSISTENT WITH PREVIOUSLY REPORTED AND CALLED VALUE.    Culture (A)  Final    STAPHYLOCOCCUS AUREUS SUSCEPTIBILITIES PERFORMED ON PREVIOUS  CULTURE WITHIN THE LAST 5 DAYS. Performed at [facility], [address], [place], [place] [address]    Report Status 09/23/2023 FINAL  Final  Culture, blood (Routine X 2) w Reflex to ID Panel     Status: Abnormal   Collection Time: 09/20/23 10:56 PM   Specimen: BLOOD RIGHT HAND  Result Value Ref Range Status   Specimen Description BLOOD RIGHT HAND  Final   Special Requests   Final    BOTTLES DRAWN AEROBIC AND ANAEROBIC Blood Culture adequate volume   Culture  Setup Time   Final    GRAM POSITIVE COCCI IN CLUSTERS IN BOTH AEROBIC AND ANAEROBIC BOTTLES CRITICAL RESULT CALLED TO, READ BACK BY AND VERIFIED WITHSalina April PHARMD, AT 1144 09/21/23 Performed at [facility], [address] [place], [place] [address]    Culture STAPHYLOCOCCUS AUREUS (A)  Final   Report Status 09/23/2023 FINAL  Final   Organism ID, Bacteria STAPHYLOCOCCUS AUREUS  Final      Susceptibility   Staphylococcus aureus - MIC*    CIPROFLOXACIN <=0.5 SENSITIVE Sensitive     ERYTHROMYCIN  <=0.25 SENSITIVE Sensitive     GENTAMICIN <=0.5 SENSITIVE Sensitive     OXACILLIN 0.5 SENSITIVE Sensitive     TETRACYCLINE <=1 SENSITIVE Sensitive     VANCOMYCIN <=0.5 SENSITIVE Sensitive     TRIMETH/SULFA <=10 SENSITIVE Sensitive     CLINDAMYCIN <=0.25 SENSITIVE Sensitive     RIFAMPIN <=0.5 SENSITIVE Sensitive     Inducible Clindamycin NEGATIVE Sensitive     LINEZOLID 2 SENSITIVE Sensitive     * STAPHYLOCOCCUS AUREUS  Blood Culture ID Panel (Reflexed)     Status: Abnormal   Collection Time: 09/20/23 10:56 PM  Result Value Ref Range Status   Enterococcus faecalis NOT DETECTED NOT DETECTED Final   Enterococcus Faecium NOT DETECTED NOT DETECTED Final   Listeria monocytogenes NOT DETECTED NOT DETECTED Final   Staphylococcus species DETECTED (A) NOT DETECTED Final    Comment: CRITICAL RESULT CALLED TO, READ BACK BY AND VERIFIED WITH: A. UTOMWEN PHARMD, AT 1144 09/21/23    Staphylococcus aureus (BCID) DETECTED (A) NOT DETECTED Final    Comment: CRITICAL RESULT CALLED TO, READ BACK BY AND VERIFIED WITH: A. UTOMWEN PHARMD, AT 1144 09/21/23    Staphylococcus epidermidis NOT DETECTED NOT DETECTED Final   Staphylococcus lugdunensis NOT DETECTED NOT DETECTED Final   Streptococcus species NOT DETECTED NOT DETECTED Final   Streptococcus agalactiae NOT DETECTED NOT DETECTED Final   Streptococcus pneumoniae NOT DETECTED NOT DETECTED Final   Streptococcus pyogenes NOT DETECTED NOT DETECTED Final   A.calcoaceticus-baumannii NOT DETECTED NOT DETECTED Final   Bacteroides fragilis NOT DETECTED NOT DETECTED Final   Enterobacterales NOT DETECTED NOT DETECTED Final   Enterobacter cloacae complex NOT DETECTED NOT DETECTED Final   Escherichia coli NOT DETECTED NOT DETECTED Final   Klebsiella aerogenes NOT DETECTED NOT DETECTED Final   Klebsiella oxytoca NOT DETECTED NOT DETECTED Final   Klebsiella pneumoniae NOT DETECTED NOT DETECTED Final   Proteus species NOT DETECTED NOT DETECTED Final   Salmonella  species NOT DETECTED NOT DETECTED Final   Serratia marcescens NOT DETECTED NOT DETECTED Final   Haemophilus influenzae NOT DETECTED NOT DETECTED Final   Neisseria meningitidis NOT DETECTED NOT DETECTED Final   Pseudomonas aeruginosa NOT DETECTED NOT DETECTED Final   Stenotrophomonas maltophilia NOT DETECTED NOT DETECTED Final   Candida albicans NOT DETECTED NOT DETECTED Final   Candida auris NOT DETECTED NOT DETECTED Final   Candida  glabrata NOT DETECTED NOT DETECTED Final   Candida krusei NOT DETECTED NOT DETECTED Final   Candida parapsilosis NOT DETECTED NOT DETECTED Final   Candida tropicalis NOT DETECTED NOT DETECTED Final   Cryptococcus neoformans/gattii NOT DETECTED NOT DETECTED Final   Meth resistant mecA/C and MREJ NOT DETECTED NOT DETECTED Final    Comment: Performed at [facility], [address] [place], [place] [address]  MRSA Next Gen by PCR, Nasal     Status: None   Collection Time: 09/21/23  7:12 AM   Specimen: Nasal Mucosa; Nasal Swab  Result Value Ref Range Status   MRSA by PCR Next Gen NOT DETECTED NOT DETECTED Final    Comment: (NOTE) The GeneXpert MRSA Assay (FDA approved for NASAL specimens only), is one component of a comprehensive MRSA colonization surveillance program. It is not intended to diagnose MRSA infection nor to guide or monitor treatment for MRSA infections. Test performance is not FDA approved in patients less than 35 years old. Performed at [facility], [address] [place], [place] [address]   Culture, blood (Routine X 2) w Reflex to ID Panel     Status: None   Collection Time: 09/22/23  6:20 AM   Specimen: BLOOD RIGHT HAND  Result Value Ref Range Status   Specimen Description BLOOD RIGHT HAND  Final   Special Requests   Final    BOTTLES DRAWN AEROBIC AND ANAEROBIC Blood Culture results may not be optimal due to an excessive volume of blood received in culture bottles   Culture   Final    NO GROWTH 5 DAYS Performed at [facility], [address], [place], [place] [address]    Report Status 09/27/2023 FINAL  Final  Culture, blood (Routine X 2) w Reflex to ID Panel     Status: None   Collection Time: 09/22/23  6:25 AM   Specimen: BLOOD LEFT HAND  Result Value Ref Range Status   Specimen Description BLOOD LEFT HAND  Final   Special Requests   Final    BOTTLES DRAWN AEROBIC AND ANAEROBIC Blood Culture results may not be optimal due to an excessive volume of blood received in culture bottles   Culture   Final    NO GROWTH 5 DAYS Performed at [facility], [address] [place], [place] [address]    Report Status 09/27/2023 FINAL  Final  Body fluid culture w Gram Stain     Status: None   Collection Time: 09/23/23  2:20 PM   Specimen: Synovium; Body Fluid  Result Value Ref Range Status   Specimen Description SYNOVIAL LEFT KNEE  Final   Special Requests NONE  Final   Gram Stain   Final    ABUNDANT WBC PRESENT, PREDOMINANTLY PMN FEW GRAM POSITIVE COCCI IN PAIRS CRITICAL RESULT CALLED TO, READ BACK BY AND VERIFIED WITH: RN ALEXIS MORGAN ON 09/23/23 @ 1909 BY DRT Performed at [facility], [address], [place], [place] [address]    Culture MODERATE STAPHYLOCOCCUS AUREUS  Final   Report Status 09/25/2023 FINAL  Final   Organism ID, Bacteria STAPHYLOCOCCUS AUREUS  Final      Susceptibility   Staphylococcus aureus - MIC*    CIPROFLOXACIN <=0.5 SENSITIVE Sensitive     ERYTHROMYCIN <=0.25 SENSITIVE Sensitive     GENTAMICIN <=0.5 SENSITIVE Sensitive     OXACILLIN 0.5 SENSITIVE Sensitive     TETRACYCLINE <=1 SENSITIVE Sensitive     VANCOMYCIN <=0.5  SENSITIVE Sensitive     TRIMETH/SULFA <=10 SENSITIVE Sensitive     CLINDAMYCIN <=0.25 SENSITIVE Sensitive     RIFAMPIN <=0.5 SENSITIVE Sensitive     Inducible Clindamycin NEGATIVE Sensitive     LINEZOLID 2 SENSITIVE Sensitive     * MODERATE STAPHYLOCOCCUS AUREUS  Aerobic/Anaerobic Culture w Gram Stain (surgical/deep wound)     Status: None  (Preliminary result)   Collection Time: 09/28/23  5:00 PM   Specimen: Path fluid; Body Fluid  Result Value Ref Range Status   Specimen Description FLUID  Final   Special Requests left knee infection  Final   Gram Stain   Final    RARE WBC PRESENT, PREDOMINANTLY PMN NO ORGANISMS SEEN Performed at [facility], [address], [place], [place] [address]    Culture   Final    CULTURE REINCUBATED FOR BETTER GROWTH NO ANAEROBES ISOLATED; CULTURE IN PROGRESS FOR 5 DAYS    Report Status PENDING  Incomplete      Radiology Studies: DG Chest Port 1 View  Result Date: 09/29/2023 CLINICAL DATA:  PICC line placement EXAM: PORTABLE CHEST 1 VIEW COMPARISON:  09/20/2023 FINDINGS: Single frontal view of the chest demonstrates right-sided PICC, tip overlying superior vena cava. The cardiac silhouette is stable, with continued ectasia and atherosclerosis of the thoracic aorta. No acute airspace disease, effusion, or pneumothorax. No acute bony abnormalities. IMPRESSION: 1. Right-sided PICC, tip overlying superior vena cava. 2. No acute intrathoracic process. 3. Stable chronic ectasia of the thoracic aorta, compatible with thoracic aortic aneurysm seen on recent CT chest. Electronically Signed   By: Sharlet Salina M.D.   On: 09/29/2023 19:22   [place] EKG SITE RITE  Result Date: 09/29/2023 If Site Rite image not attached, placement could not be confirmed due to current cardiac rhythm.    Scheduled Meds:  apixaban  2.5 mg Oral Q12H   [START ON 10/01/2023] apixaban  5 mg Oral BID   Chlorhexidine Gluconate Cloth  6 each Topical Daily   diltiazem  120 mg Oral Daily   docusate sodium  100 mg Oral BID   feeding supplement  1 Container Oral TID BM   Gerhardt's butt cream   Topical TID   insulin aspart  0-5 Units Subcutaneous QHS   insulin aspart  0-9 Units Subcutaneous TID WC   irbesartan  300 mg Oral Daily   melatonin  6 mg Oral QHS   metoprolol tartrate  12.5 mg Oral BID   multivitamin with  minerals  1 tablet Oral Daily   polyethylene glycol  17 g Oral BID   QUEtiapine  12.5 mg Oral QHS   sodium chloride flush  10 mL Intravenous Q12H   sodium chloride flush  10-40 mL Intracatheter Q12H   Continuous Infusions:   ceFAZolin (ANCEF) IV 2 g (09/30/23 1317)   methocarbamol (ROBAXIN) IV Stopped (09/29/23 0423)      LOS: 13 days    Arnetha Courser, MD [facility] Available via Epic secure chat 7am-7pm After these hours, please refer to coverage provider listed on [email or web address] 09/30/2023, 4:20 PM

## 2023-09-30 NOTE — Plan of Care (Signed)
  Problem: Education: Goal: Ability to describe self-care measures that may prevent or decrease complications (Diabetes Survival Skills Education) will improve Outcome: Progressing Goal: Individualized Educational Video(s) Outcome: Progressing   Problem: Coping: Goal: Ability to adjust to condition or change in health will improve Outcome: Progressing   Problem: Fluid Volume: Goal: Ability to maintain a balanced intake and output will improve Outcome: Progressing   Problem: Health Behavior/Discharge Planning: Goal: Ability to identify and utilize available resources and services will improve Outcome: Progressing Goal: Ability to manage health-related needs will improve Outcome: Progressing   Problem: Metabolic: Goal: Ability to maintain appropriate glucose levels will improve Outcome: Progressing   Problem: Nutritional: Goal: Maintenance of adequate nutrition will improve Outcome: Progressing Goal: Progress toward achieving an optimal weight will improve Outcome: Progressing   Problem: Skin Integrity: Goal: Risk for impaired skin integrity will decrease Outcome: Progressing   Problem: Tissue Perfusion: Goal: Adequacy of tissue perfusion will improve Outcome: Progressing   Problem: Education: Goal: Knowledge of General Education information will improve Description: Including pain rating scale, medication(s)/side effects and non-pharmacologic comfort measures Outcome: Progressing   Problem: Health Behavior/Discharge Planning: Goal: Ability to manage health-related needs will improve Outcome: Progressing   Problem: Clinical Measurements: Goal: Ability to maintain clinical measurements within normal limits will improve Outcome: Progressing Goal: Will remain free from infection Outcome: Progressing Goal: Diagnostic test results will improve Outcome: Progressing Goal: Cardiovascular complication will be avoided Outcome: Progressing   Problem: Activity: Goal: Risk  for activity intolerance will decrease Outcome: Progressing   Problem: Nutrition: Goal: Adequate nutrition will be maintained Outcome: Progressing   Problem: Coping: Goal: Level of anxiety will decrease Outcome: Progressing   Problem: Elimination: Goal: Will not experience complications related to bowel motility Outcome: Progressing Goal: Will not experience complications related to urinary retention Outcome: Progressing   Problem: Pain Managment: Goal: General experience of comfort will improve Outcome: Progressing   Problem: Safety: Goal: Ability to remain free from injury will improve Outcome: Progressing   Problem: Skin Integrity: Goal: Risk for impaired skin integrity will decrease Outcome: Progressing   Problem: Education: Goal: Knowledge of the prescribed therapeutic regimen will improve Outcome: Progressing Goal: Individualized Educational Video(s) Outcome: Progressing   Problem: Activity: Goal: Ability to avoid complications of mobility impairment will improve Outcome: Progressing Goal: Range of joint motion will improve Outcome: Progressing   Problem: Clinical Measurements: Goal: Postoperative complications will be avoided or minimized Outcome: Progressing   Problem: Pain Management: Goal: Pain level will decrease with appropriate interventions Outcome: Progressing   Problem: Skin Integrity: Goal: Will show signs of wound healing Outcome: Progressing

## 2023-09-30 NOTE — Progress Notes (Signed)
   Subjective: 2 Days Post-Op Procedure(s) (LRB): IRRIGATION AND DEBRIDEMENT KNEE WITH POLY EXCHANGE (Left) Patient seen in rounds for Dr. Lequita Halt. Patient reports pain as moderate. Apprehensive to move knee secondary to pain. Denies calf pain.  Objective: Vital signs in last 24 hours: Temp:  [97.4 F (36.3 C)-98.6 F (37 C)] 98.3 F (36.8 C) (10/09 0731) Pulse Rate:  [75-102] 100 (10/09 0731) Resp:  [17-19] 17 (10/09 0731) BP: (117-136)/(56-71) 136/57 (10/09 0731) SpO2:  [91 %-100 %] 95 % (10/09 0731) Weight:  [87.6 kg] 87.6 kg (10/09 0500)  Intake/Output from previous day:  Intake/Output Summary (Last 24 hours) at 09/30/2023 0755 Last data filed at 09/30/2023 0000 Gross per 24 hour  Intake 321.21 ml  Output 1250 ml  Net -928.79 ml    Intake/Output this shift: No intake/output data recorded.  Labs: Recent Labs    09/29/23 0505 09/30/23 0437  HGB 12.2* 9.6*   Recent Labs    09/29/23 0505 09/30/23 0437  WBC 15.3* 10.2  RBC 3.98* 3.08*  HCT 37.6* 28.8*  PLT 248 216   Recent Labs    09/29/23 0505 09/30/23 0437  NA 139 141  K 3.8 3.1*  CL 100 103  CO2 25 29  BUN 19 21  CREATININE 1.17 1.09  GLUCOSE 189* 132*  CALCIUM 7.9* 7.6*   No results for input(s): "LABPT", "INR" in the last 72 hours.  Exam: General - Patient is Alert and Confused Extremity - Neurologically intact Neurovascular intact Sensation intact distally Dorsiflexion/Plantar flexion intact Dressing/Incision - scant drainage at proximal incision Motor Function - intact, moving foot and toes well on exam.  Past Medical History:  Diagnosis Date   Arthritis    back,   DJD (degenerative joint disease)    Dysrhythmia    A-fib   Glaucoma    Hypertension     Assessment/Plan: 2 Days Post-Op Procedure(s) (LRB): IRRIGATION AND DEBRIDEMENT KNEE WITH POLY EXCHANGE (Left) Principal Problem:   Prosthetic joint infection (HCC) Active Problems:   Essential hypertension   Chronic atrial  fibrillation (HCC)   Bleeding in head following injury with loss of consciousness (HCC)   Subdural hematoma (HCC)   DM2 (diabetes mellitus, type 2) (HCC)   Neck injury   Fall   Scalp laceration   Bradycardia   Protein-calorie malnutrition, moderate (HCC)   SDH (subdural hematoma) (HCC)   Staphylococcus aureus bacteremia   Effusion of left knee  Estimated body mass index is 27.71 kg/m as calculated from the following:   Height as of this encounter: 5\' 10"  (1.778 m).   Weight as of this encounter: 87.6 kg.  DVT Prophylaxis -  Eliquis Weight-bearing as tolerated.  Continue physical therapy. Plan for SNF once cleared for discharge. Ortho will continue to follow.  R. Arcola Jansky, PA-C Orthopedic Surgery 09/30/2023, 7:55 AM

## 2023-09-30 NOTE — Progress Notes (Signed)
Occupational Therapy Treatment Patient Details Name: Mathew Simpson MRN: [id number] DOB: May 24, 1936 Today's Date: 09/30/2023   History of present illness Mathew Simpson is a 87 y.o. male admitted 9/17 to the hospital after presenting to the emergency department after a fall.  CT scan was therefore obtained which demonstrated a small intracranial hemorrhage in the left frontal/parietal region. C-spine CT was worrisome for instability but pt seen by neurosurgery and repeat CT demonstrated stable on no requirement for cervical immobilization. Pt with worsening confusion but CT on 9/29 demonstrated decreased size in SDH.  Pt did have fever 9/29 - ongoing workup, noted possible need for TEE.  Underwent I&D with poly liner exchange L knee on 09/28/23.  PMH for hypertension, glaucoma, hx of B TKR.   OT comments  Pt seen for goal update, goals downgraded as pt meeting 1/6 goals. Pt limited by L knee pain this session and needs mod encouragement to participate in OT session. Pt needing mod-max A for seated/bed level ADLs, mod A +2 for bed mobility and max A+2 for 2 standing attempts in stedy frame in prep for toilet transfers. Pt keeping trunk flexed, difficulty extending BUE to stand upright. Pt presenting with impairments listed below, will follow acutely. Patient will benefit from continued inpatient follow up therapy, <3 hours/day to maximize safety/ind with ADLs/functional mobility.       If plan is discharge home, recommend the following:  A little help with walking and/or transfers;A little help with bathing/dressing/bathroom;Assist for transportation;Help with stairs or ramp for entrance;Assistance with cooking/housework   Equipment Recommendations  Other (comment) (defer)    Recommendations for Other Services PT consult    Precautions / Restrictions Precautions Precautions: Fall Restrictions Weight Bearing Restrictions: Yes LLE Weight Bearing: Weight bearing as tolerated       Mobility Bed  Mobility Overal bed mobility: Needs Assistance     Sidelying to sit: Mod assist, +2 for physical assistance     Sit to sidelying: Mod assist, +2 for safety/equipment      Transfers Overall transfer level: Needs assistance Equipment used: Ambulation equipment used Transfers: Sit to/from Stand Sit to Stand: Max assist, +2 physical assistance           General transfer comment: max +2 to stand x2 in stedy frame, pt with trunk flexion and R lateral lean in standing Transfer via Lift Equipment: Stedy   Balance Overall balance assessment: Needs assistance Sitting-balance support: Bilateral upper extremity supported, No upper extremity supported Sitting balance-Leahy Scale: Fair     Standing balance support: No upper extremity supported Standing balance-Leahy Scale: Zero Standing balance comment: +2 A for attempts at standing                           ADL either performed or assessed with clinical judgement   ADL Overall ADL's : Needs assistance/impaired                 Upper Body Dressing : Bed level;Moderate assistance Upper Body Dressing Details (indicate cue type and reason): donning gown     Toilet Transfer: Maximal assistance;+2 for physical assistance Toilet Transfer Details (indicate cue type and reason): standing in stedy in prep for toilet transfer         Functional mobility during ADLs: Maximal assistance;+2 for physical assistance      Extremity/Trunk Assessment Upper Extremity Assessment Upper Extremity Assessment: Generalized weakness   Lower Extremity Assessment Lower Extremity Assessment: Defer to PT evaluation  Vision   Additional Comments: Glaucoma at baseline per chart.   Perception Perception Perception: Not tested   Praxis Praxis Praxis: Not tested    Cognition Arousal: Lethargic Behavior During Therapy: Anxious Overall Cognitive Status: No family/caregiver present to determine baseline cognitive  functioning                                 General Comments: sleeping upon arrival, anxious with mobility requiring increased encouragement        Exercises      Shoulder Instructions       General Comments VSS    Pertinent Vitals/ Pain       Pain Assessment Pain Assessment: Faces Pain Score: 6  Faces Pain Scale: Hurts even more Pain Location: L knee Pain Descriptors / Indicators: Grimacing, Guarding Pain Intervention(s): Limited activity within patient's tolerance, Monitored during session, Repositioned  Home Living                                          Prior Functioning/Environment              Frequency  Min 1X/week        Progress Toward Goals  OT Goals(current goals can now be found in the care plan section)  Progress towards OT goals: Goals drowngraded-see care plan  Acute Rehab OT Goals Patient Stated Goal: none stated OT Goal Formulation: With patient Time For Goal Achievement: 09/23/23 Potential to Achieve Goals: Good ADL Goals Pt Will Perform Grooming: with modified independence;sitting Pt Will Perform Lower Body Bathing: with min assist;sitting/lateral leans;bed level Pt Will Perform Lower Body Dressing: with min assist;sitting/lateral leans;bed level Pt Will Transfer to Toilet: with min assist;squat pivot transfer;stand pivot transfer;bedside commode Pt Will Perform Toileting - Clothing Manipulation and hygiene: with min assist;sitting/lateral leans Additional ADL Goal #1: pt will perform bed mobility with CGA in prep for ADLs  Plan      Co-evaluation                 AM-PAC OT "6 Clicks" Daily Activity     Outcome Measure   Help from another person eating meals?: A Little Help from another person taking care of personal grooming?: A Little Help from another person toileting, which includes using toliet, bedpan, or urinal?: A Lot Help from another person bathing (including washing, rinsing,  drying)?: A Lot Help from another person to put on and taking off regular upper body clothing?: A Lot Help from another person to put on and taking off regular lower body clothing?: A Lot 6 Click Score: 14    End of Session Equipment Utilized During Treatment: Gait belt;Other (comment) (stedy)  OT Visit Diagnosis: Unsteadiness on feet (R26.81);Other abnormalities of gait and mobility (R26.89);Repeated falls (R29.6);Pain Pain - Right/Left: Left Pain - part of body: Knee   Activity Tolerance Patient limited by pain   Patient Left in bed;with call bell/phone within reach;with bed alarm set   Nurse Communication Mobility status        Time: 1530-1600 OT Time Calculation (min): 30 min  Charges: OT General Charges $OT Visit: 1 Visit OT Treatments $Self Care/Home Management : 8-22 mins $Therapeutic Activity: 8-22 mins  Carver Fila, OTD, OTR/L SecureChat Preferred Acute Rehab [phone]   Carver Fila Mathew Simpson 09/30/2023, 4:51 PM

## 2023-09-30 NOTE — Progress Notes (Signed)
MD, Nelson Chimes, requested that the dietician who has been evaluating this patient to put in their recommendations in the AVS for tomorrow Discharge.   Balinda Quails, RN 09/30/2023 4:26 PM

## 2023-10-01 DIAGNOSIS — R7881 Bacteremia: Secondary | ICD-10-CM | POA: Diagnosis not present

## 2023-10-01 DIAGNOSIS — I482 Chronic atrial fibrillation, unspecified: Secondary | ICD-10-CM | POA: Diagnosis not present

## 2023-10-01 DIAGNOSIS — S065XAA Traumatic subdural hemorrhage with loss of consciousness status unknown, initial encounter: Secondary | ICD-10-CM | POA: Diagnosis not present

## 2023-10-01 DIAGNOSIS — T8450XD Infection and inflammatory reaction due to unspecified internal joint prosthesis, subsequent encounter: Secondary | ICD-10-CM | POA: Diagnosis not present

## 2023-10-01 LAB — GLUCOSE, CAPILLARY
Glucose-Capillary: 141 mg/dL — ABNORMAL HIGH (ref 70–99)
Glucose-Capillary: 217 mg/dL — ABNORMAL HIGH (ref 70–99)

## 2023-10-01 MED ORDER — EPINEPHRINE 0.3 MG/0.3ML IJ SOAJ: 0.3000 mg | Freq: Once | INTRAMUSCULAR | Status: DC | PRN

## 2023-10-01 MED ORDER — POLYETHYLENE GLYCOL 3350 17 G PO PACK: 17.0000 g | PACK | Freq: Every day | ORAL | Status: DC | PRN

## 2023-10-01 MED ORDER — CEFAZOLIN IV (FOR PTA / DISCHARGE USE ONLY): 2.0000 g | Freq: Three times a day (TID) | INTRAVENOUS | 0 refills | Status: DC

## 2023-10-01 MED ORDER — HYDROCODONE-ACETAMINOPHEN 5-325 MG PO TABS: 1.0000 | ORAL_TABLET | ORAL | 0 refills | Status: DC | PRN

## 2023-10-01 MED ORDER — IRBESARTAN 300 MG PO TABS: 300.0000 mg | ORAL_TABLET | Freq: Every day | ORAL | Status: DC

## 2023-10-01 MED ORDER — DILTIAZEM HCL ER COATED BEADS 120 MG PO CP24: 120.0000 mg | ORAL_CAPSULE | Freq: Every day | ORAL | Status: DC

## 2023-10-01 MED ORDER — ACETAMINOPHEN 325 MG PO TABS: 650.0000 mg | ORAL_TABLET | Freq: Four times a day (QID) | ORAL | Status: DC | PRN

## 2023-10-01 MED ORDER — DOCUSATE SODIUM 100 MG PO CAPS: 100.0000 mg | ORAL_CAPSULE | Freq: Two times a day (BID) | ORAL | Status: DC

## 2023-10-01 MED ORDER — MELATONIN 3 MG PO TABS: 6.0000 mg | ORAL_TABLET | Freq: Every day | ORAL | Status: DC

## 2023-10-01 MED ORDER — METOPROLOL TARTRATE 25 MG PO TABS: 12.5000 mg | ORAL_TABLET | Freq: Two times a day (BID) | ORAL | Status: DC

## 2023-10-01 MED ORDER — GERHARDT'S BUTT CREAM: 1.0000 | TOPICAL_CREAM | Freq: Three times a day (TID) | CUTANEOUS | Status: DC

## 2023-10-01 MED ORDER — DIPHENHYDRAMINE HCL 12.5 MG/5ML PO ELIX: 12.5000 mg | ORAL_SOLUTION | ORAL | Status: DC | PRN

## 2023-10-01 MED ORDER — QUETIAPINE FUMARATE 25 MG PO TABS: 12.5000 mg | ORAL_TABLET | Freq: Every day | ORAL | Status: DC

## 2023-10-01 MED ORDER — BISACODYL 10 MG RE SUPP: 10.0000 mg | Freq: Every day | RECTAL | Status: DC | PRN

## 2023-10-01 MED ORDER — ONDANSETRON HCL 4 MG PO TABS: 4.0000 mg | ORAL_TABLET | Freq: Four times a day (QID) | ORAL | Status: DC | PRN

## 2023-10-01 NOTE — TOC Transition Note (Signed)
Transition of Care [facility]) - CM/SW Discharge Note   Patient Details  Name: Mathew Simpson MRN: [id number] Date of Birth: [birth year]  [facility] CM/SW Contact:  Larance Ratledge Felipa Emory, Student-Social Work Phone Number: 10/01/2023, 12:48 PM  Clinical Narrative:   MSW Student and CSW Spoke with [employer] and confirmed about transfer. MSW student and CSW spoke with patient's granddaughter [name] and notified of transfer. MSW student set up transport with [facility]. [facility] next available   Nurse to report: [phone] RM: [phone]   Final next level of care: Skilled Nursing Facility Barriers to Discharge: Barriers Resolved   Patient Goals and CMS Choice CMS Medicare.gov Compare Post Acute Care list provided to:: Patient Choice offered to / list presented to : Patient  Discharge Placement                Patient chooses bed at: [facility] Patient to be transferred to facility by: [facility] Name of family member notified: Thomasa Patient and family notified of of transfer: 10/01/23  Discharge Plan and Services Additional resources added to the After Visit Summary for       Post Acute Care Choice: Skilled Nursing Facility                               Social Determinants of Health (SDOH) Interventions SDOH Screenings   Food Insecurity: No Food Insecurity (09/10/2023)  Housing: Low Risk  (09/10/2023)  Transportation Needs: No Transportation Needs (09/10/2023)  Utilities: Not At Risk (09/10/2023)  Tobacco Use: Medium Risk (09/28/2023)     Readmission Risk Interventions    07/23/2022   11:40 AM 07/21/2022   10:11 AM  Readmission Risk Prevention Plan  Post Dischage Appt Complete Complete  Medication Screening Complete Complete  Transportation Screening Complete Complete

## 2023-10-01 NOTE — Progress Notes (Signed)
Physical Therapy Treatment Patient Details Name: Mathew Simpson MRN: [id number] DOB: June 27, 1936 Today's Date: 10/01/2023   History of Present Illness Mathew Simpson is a 87 y.o. male admitted 9/17 to the hospital after presenting to the emergency department after a fall.  CT scan was therefore obtained which demonstrated a small intracranial hemorrhage in the left frontal/parietal region. C-spine CT was worrisome for instability but pt seen by neurosurgery and repeat CT demonstrated stable on no requirement for cervical immobilization. Pt with worsening confusion but CT on 9/29 demonstrated decreased size in SDH.  Pt did have fever 9/29 - ongoing workup, noted possible need for TEE.  Underwent I&D with poly liner exchange L knee on 09/28/23.  PMH for hypertension, glaucoma, hx of B TKR.    PT Comments  Patient unable to achieve full upright standing this session.  Limited by L knee pain and anxiety though did attempt twice with improvement in second trial.  Transitioned to recliner via lateral scoot transfer in which pt was able to participate as well.  He continues to need specific step by step directions repeated throughout session, though eager to understand.  Appropriate for inpatient rehab (< 3 hours/day) at d/c.    If plan is discharge home, recommend the following: Two people to help with walking and/or transfers;Two people to help with bathing/dressing/bathroom;Assist for transportation;Help with stairs or ramp for entrance;Assistance with cooking/housework   Can travel by private vehicle        Equipment Recommendations  None recommended by PT    Recommendations for Other Services       Precautions / Restrictions Precautions Precautions: Fall Restrictions LLE Weight Bearing: Weight bearing as tolerated     Mobility  Bed Mobility Overal bed mobility: Needs Assistance Bed Mobility: Supine to Sit   Sidelying to sit: Mod assist, +2 for physical assistance, HOB elevated, Used  rails       General bed mobility comments: cues and assist for using rails, moving L LE, scooting hips and lifting trunk    Transfers Overall transfer level: Needs assistance Equipment used: Rolling walker (2 wheels) Transfers: Sit to/from Stand, Bed to chair/wheelchair/BSC Sit to Stand: Max assist, +2 physical assistance          Lateral/Scoot Transfers: Max assist, +2 physical assistance General transfer comment: stood x 2 with A to RW though pt fearful, keeping weight posterior and not fully upright despite cues and encouragement, assisted to drop arm recliner scooting hips on bed pad to recliner    Ambulation/Gait                   Stairs             Wheelchair Mobility     Tilt Bed    Modified Rankin (Stroke Patients Only)       Balance Overall balance assessment: Needs assistance Sitting-balance support: Feet supported Sitting balance-Leahy Scale: Fair Sitting balance - Comments: on EOB no UE support   Standing balance support: Bilateral upper extremity supported Standing balance-Leahy Scale: Zero Standing balance comment: +2 with RW attempts to stand                            Cognition Arousal: Alert Behavior During Therapy: Anxious Overall Cognitive Status: No family/caregiver present to determine baseline cognitive functioning  General Comments: awake and needing extra time and repeated cues for directions        Exercises Total Joint Exercises Ankle Circles/Pumps: Both, Supine, AROM, 10 reps Towel Squeeze: AROM, Both, 10 reps, Supine (with knee press) Heel Slides: AROM, Left, Seated, 10 reps    General Comments        Pertinent Vitals/Pain Pain Assessment Faces Pain Scale: Hurts whole lot Pain Location: L knee Pain Descriptors / Indicators: Grimacing, Guarding Pain Intervention(s): Monitored during session, Repositioned, Premedicated before session    Home Living                           Prior Function            PT Goals (current goals can now be found in the care plan section) Progress towards PT goals: Progressing toward goals    Frequency    Min 1X/week      PT Plan      Co-evaluation              AM-PAC PT "6 Clicks" Mobility   Outcome Measure  Help needed turning from your back to your side while in a flat bed without using bedrails?: A Lot Help needed moving from lying on your back to sitting on the side of a flat bed without using bedrails?: Total Help needed moving to and from a bed to a chair (including a wheelchair)?: Total Help needed standing up from a chair using your arms (e.g., wheelchair or bedside chair)?: Total Help needed to walk in hospital room?: Total Help needed climbing 3-5 steps with a railing? : Total 6 Click Score: 7    End of Session Equipment Utilized During Treatment: Gait belt;Oxygen Activity Tolerance: Patient limited by pain Patient left: in chair;with call bell/phone within reach;with chair alarm set Nurse Communication: Mobility status PT Visit Diagnosis: Other abnormalities of gait and mobility (R26.89);Muscle weakness (generalized) (M62.81);Pain Pain - Right/Left: Left Pain - part of body: Knee     Time: 1232-1300 PT Time Calculation (min) (ACUTE ONLY): 28 min  Charges:    $Therapeutic Activity: 23-37 mins PT General Charges $$ ACUTE PT VISIT: 1 Visit                     Sheran Lawless, PT Acute Rehabilitation Services Office:[phone] 10/01/2023    Elray Mcgregor 10/01/2023, 6:13 PM

## 2023-10-01 NOTE — Discharge Summary (Signed)
aorta to 4.1 cm is noted. No dissection is seen. The pulmonary artery shows a normal branching pattern. No large central pulmonary embolus is noted. Coronary calcifications are noted. No cardiac enlargement is seen. Mediastinum/Nodes: Thoracic inlet is within normal limits. No hilar or mediastinal adenopathy is noted. The esophagus as visualized is within normal limits. Lungs/Pleura: Mild left lower lobe atelectatic changes are seen. No focal confluent infiltrate or sizable effusion is noted. Musculoskeletal: Degenerative changes of the thoracic spine are seen. No acute rib abnormality is noted. CT ABDOMEN PELVIS FINDINGS Hepatobiliary: Fatty infiltration of the liver is noted. Gallbladder is within normal limits. Pancreas: Unremarkable. No pancreatic ductal dilatation or surrounding inflammatory changes. Spleen: Normal in size without focal abnormality. Adrenals/Urinary Tract: Adrenal glands are within normal limits. Multiple simple appearing cysts are noted throughout both kidneys stable in appearance from the prior exam. No further follow-up is recommended. Scattered nonobstructing renal stones are seen. The ureters are within normal limits. The bladder is decompressed. Stomach/Bowel: The appendix is within normal limits. No obstructive or inflammatory changes of the colon are seen. The stomach and small bowel are within  normal limits. Vascular/Lymphatic: Aortic atherosclerosis. No enlarged abdominal or pelvic lymph nodes. Reproductive: Prostate is unremarkable. Other: No abdominal wall hernia or abnormality. No abdominopelvic ascites. Musculoskeletal: Degenerative changes of lumbar spine are noted. IMPRESSION: CT of the chest: Dilatation of the ascending aorta to 4.1 cm. Recommend annual imaging followup by CTA or MRA. This recommendation follows 2010 ACCF/AHA/AATS/ACR/ASA/SCA/SCAI/SIR/STS/SVM Guidelines for the Diagnosis and Management of Patients with Thoracic Aortic Disease. Circulation. 2010; 121: Z610-R604. Aortic aneurysm NOS (ICD10-I71.9) Mild left basilar atelectasis. CT of the abdomen and pelvis: Fatty infiltration of the liver. Nonobstructing renal calculi bilaterally. The largest of these is noted on the left measuring approximately 5 mm. Electronically Signed   By: Alcide Clever M.D.   On: 09/21/2023 21:33   ECHOCARDIOGRAM COMPLETE  Result Date: 09/21/2023    ECHOCARDIOGRAM REPORT   Patient Name:   Mathew Simpson Date of Exam: 09/21/2023 Medical Rec #:  [id number]     Height:       70.0 in Accession #:    [id number]    Weight:       195.0 lb Date of Birth:  1936-05-17      BSA:          2.065 m Patient Age:    87 years      BP:           107/74 mmHg Patient Gender: M             HR:           75 bpm. Exam Location:  Inpatient Procedure: 2D Echo, Cardiac Doppler and Color Doppler Indications:    Bacteremia R78.81  History:        Patient has prior history of Echocardiogram examinations, most                 recent 09/15/2023. Arrythmias:Atrial Fibrillation; Risk                 Factors:Diabetes, Hypertension and Former Smoker.  Sonographer:    Dondra Prader RVT RCS Referring Phys: [id number] ERIC J [place]  Sonographer Comments: Technically challenging study due to limited acoustic windows, Technically difficult study due to poor echo windows, suboptimal parasternal window and suboptimal apical window. Image acquisition  challenging due to uncooperative patient and Image acquisition challenging due to respiratory motion. Patient supine. Unable to complete study; patient kept pushing probe  opacities or effusions. No acute bony abnormality. IMPRESSION: No active disease. Electronically Signed   By: Charlett Nose M.D.   On: 09/20/2023 21:43   CT HEAD WO CONTRAST ( )  Result Date: 09/16/2023 CLINICAL DATA:  Subdural hematoma Worsening mental status; follow-up stability of previous SDH EXAM: CT HEAD WITHOUT CONTRAST TECHNIQUE: Contiguous axial images were obtained from the base of the skull through the vertex without intravenous contrast. RADIATION DOSE REDUCTION: This exam was performed according to the departmental dose-optimization program which includes automated exposure control, adjustment of the mA and/or kV according to patient size and/or use of iterative reconstruction technique. COMPARISON:  CT head 09/09/2023. FINDINGS: Motion limited study.  Within this limitation: Brain: Similar 3 mm subdural hemorrhage along the falx. Similar size and decreased density of a 6 mm hemorrhagic contusion in the posterior left frontal lobe at the vertex. No evidence of new/interval acute hemorrhage, mass lesion, midline shift or hydrocephalus. 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 Calcific atherosclerosis. Skull: No acute fracture. Sinuses/Orbits: Clear sinuses.  No acute orbital findings. Other: No mastoid effusions. IMPRESSION: 1. Similar 3 mm subdural hemorrhage along the falx. 2. Similar size and decreased density of a 6 mm hemorrhagic contusion in the posterior left frontal lobe at  the vertex. 3. 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 Electronically Signed   By: Feliberto Harts M.D.   On: 09/16/2023 15:49   ECHOCARDIOGRAM COMPLETE  Result Date: 09/15/2023    ECHOCARDIOGRAM REPORT   Patient Name:   Mathew Simpson Date of Exam: 09/15/2023 Medical Rec #:  [id number]     Height:       70.0 in Accession #:    [id number]    Weight:       195.0 lb Date of Birth:  07-12-1936      BSA:          2.065 m Patient Age:    87 years      BP:           153/77 mmHg Patient Gender: M             HR:           81 bpm. Exam Location:  Inpatient Procedure: 2D Echo, Cardiac Doppler and Color Doppler Indications:    Abnormal ECG R94.31  History:        Patient has no prior history of Echocardiogram examinations.                 Arrythmias:Atrial Fibrillation; Risk Factors:Hypertension and                 Diabetes.  Sonographer:    Lucendia Herrlich Referring Phys: [id number] CAROLE N [place]  Sonographer Comments: Image acquisition challenging due to patient body habitus. IMPRESSIONS  1. Left ventricular ejection fraction, by estimation, is 65 to 70%. The left ventricle has normal function. The left ventricle has no regional wall motion abnormalities. There is mild concentric left ventricular hypertrophy. Left ventricular diastolic parameters are indeterminate.  2. Right ventricular systolic function is normal. The right ventricular size is severely enlarged. Tricuspid regurgitation signal is inadequate for assessing PA pressure.  3. Left atrial size was mildly dilated.  4. Right atrial size was mildly dilated.  5. The mitral valve was not well visualized. No evidence of mitral valve regurgitation. No evidence of mitral stenosis.  6. Eccentric aortic regurgitation see in 5 chamber view. The aortic valve is tricuspid. Aortic valve regurgitation is mild. No aortic stenosis is present.  aorta to 4.1 cm is noted. No dissection is seen. The pulmonary artery shows a normal branching pattern. No large central pulmonary embolus is noted. Coronary calcifications are noted. No cardiac enlargement is seen. Mediastinum/Nodes: Thoracic inlet is within normal limits. No hilar or mediastinal adenopathy is noted. The esophagus as visualized is within normal limits. Lungs/Pleura: Mild left lower lobe atelectatic changes are seen. No focal confluent infiltrate or sizable effusion is noted. Musculoskeletal: Degenerative changes of the thoracic spine are seen. No acute rib abnormality is noted. CT ABDOMEN PELVIS FINDINGS Hepatobiliary: Fatty infiltration of the liver is noted. Gallbladder is within normal limits. Pancreas: Unremarkable. No pancreatic ductal dilatation or surrounding inflammatory changes. Spleen: Normal in size without focal abnormality. Adrenals/Urinary Tract: Adrenal glands are within normal limits. Multiple simple appearing cysts are noted throughout both kidneys stable in appearance from the prior exam. No further follow-up is recommended. Scattered nonobstructing renal stones are seen. The ureters are within normal limits. The bladder is decompressed. Stomach/Bowel: The appendix is within normal limits. No obstructive or inflammatory changes of the colon are seen. The stomach and small bowel are within  normal limits. Vascular/Lymphatic: Aortic atherosclerosis. No enlarged abdominal or pelvic lymph nodes. Reproductive: Prostate is unremarkable. Other: No abdominal wall hernia or abnormality. No abdominopelvic ascites. Musculoskeletal: Degenerative changes of lumbar spine are noted. IMPRESSION: CT of the chest: Dilatation of the ascending aorta to 4.1 cm. Recommend annual imaging followup by CTA or MRA. This recommendation follows 2010 ACCF/AHA/AATS/ACR/ASA/SCA/SCAI/SIR/STS/SVM Guidelines for the Diagnosis and Management of Patients with Thoracic Aortic Disease. Circulation. 2010; 121: Z610-R604. Aortic aneurysm NOS (ICD10-I71.9) Mild left basilar atelectasis. CT of the abdomen and pelvis: Fatty infiltration of the liver. Nonobstructing renal calculi bilaterally. The largest of these is noted on the left measuring approximately 5 mm. Electronically Signed   By: Alcide Clever M.D.   On: 09/21/2023 21:33   ECHOCARDIOGRAM COMPLETE  Result Date: 09/21/2023    ECHOCARDIOGRAM REPORT   Patient Name:   Mathew Simpson Date of Exam: 09/21/2023 Medical Rec #:  [id number]     Height:       70.0 in Accession #:    [id number]    Weight:       195.0 lb Date of Birth:  1936-05-17      BSA:          2.065 m Patient Age:    87 years      BP:           107/74 mmHg Patient Gender: M             HR:           75 bpm. Exam Location:  Inpatient Procedure: 2D Echo, Cardiac Doppler and Color Doppler Indications:    Bacteremia R78.81  History:        Patient has prior history of Echocardiogram examinations, most                 recent 09/15/2023. Arrythmias:Atrial Fibrillation; Risk                 Factors:Diabetes, Hypertension and Former Smoker.  Sonographer:    Dondra Prader RVT RCS Referring Phys: [id number] ERIC J [place]  Sonographer Comments: Technically challenging study due to limited acoustic windows, Technically difficult study due to poor echo windows, suboptimal parasternal window and suboptimal apical window. Image acquisition  challenging due to uncooperative patient and Image acquisition challenging due to respiratory motion. Patient supine. Unable to complete study; patient kept pushing probe  aorta to 4.1 cm is noted. No dissection is seen. The pulmonary artery shows a normal branching pattern. No large central pulmonary embolus is noted. Coronary calcifications are noted. No cardiac enlargement is seen. Mediastinum/Nodes: Thoracic inlet is within normal limits. No hilar or mediastinal adenopathy is noted. The esophagus as visualized is within normal limits. Lungs/Pleura: Mild left lower lobe atelectatic changes are seen. No focal confluent infiltrate or sizable effusion is noted. Musculoskeletal: Degenerative changes of the thoracic spine are seen. No acute rib abnormality is noted. CT ABDOMEN PELVIS FINDINGS Hepatobiliary: Fatty infiltration of the liver is noted. Gallbladder is within normal limits. Pancreas: Unremarkable. No pancreatic ductal dilatation or surrounding inflammatory changes. Spleen: Normal in size without focal abnormality. Adrenals/Urinary Tract: Adrenal glands are within normal limits. Multiple simple appearing cysts are noted throughout both kidneys stable in appearance from the prior exam. No further follow-up is recommended. Scattered nonobstructing renal stones are seen. The ureters are within normal limits. The bladder is decompressed. Stomach/Bowel: The appendix is within normal limits. No obstructive or inflammatory changes of the colon are seen. The stomach and small bowel are within  normal limits. Vascular/Lymphatic: Aortic atherosclerosis. No enlarged abdominal or pelvic lymph nodes. Reproductive: Prostate is unremarkable. Other: No abdominal wall hernia or abnormality. No abdominopelvic ascites. Musculoskeletal: Degenerative changes of lumbar spine are noted. IMPRESSION: CT of the chest: Dilatation of the ascending aorta to 4.1 cm. Recommend annual imaging followup by CTA or MRA. This recommendation follows 2010 ACCF/AHA/AATS/ACR/ASA/SCA/SCAI/SIR/STS/SVM Guidelines for the Diagnosis and Management of Patients with Thoracic Aortic Disease. Circulation. 2010; 121: Z610-R604. Aortic aneurysm NOS (ICD10-I71.9) Mild left basilar atelectasis. CT of the abdomen and pelvis: Fatty infiltration of the liver. Nonobstructing renal calculi bilaterally. The largest of these is noted on the left measuring approximately 5 mm. Electronically Signed   By: Alcide Clever M.D.   On: 09/21/2023 21:33   ECHOCARDIOGRAM COMPLETE  Result Date: 09/21/2023    ECHOCARDIOGRAM REPORT   Patient Name:   Mathew Simpson Date of Exam: 09/21/2023 Medical Rec #:  [id number]     Height:       70.0 in Accession #:    [id number]    Weight:       195.0 lb Date of Birth:  1936-05-17      BSA:          2.065 m Patient Age:    87 years      BP:           107/74 mmHg Patient Gender: M             HR:           75 bpm. Exam Location:  Inpatient Procedure: 2D Echo, Cardiac Doppler and Color Doppler Indications:    Bacteremia R78.81  History:        Patient has prior history of Echocardiogram examinations, most                 recent 09/15/2023. Arrythmias:Atrial Fibrillation; Risk                 Factors:Diabetes, Hypertension and Former Smoker.  Sonographer:    Dondra Prader RVT RCS Referring Phys: [id number] ERIC J [place]  Sonographer Comments: Technically challenging study due to limited acoustic windows, Technically difficult study due to poor echo windows, suboptimal parasternal window and suboptimal apical window. Image acquisition  challenging due to uncooperative patient and Image acquisition challenging due to respiratory motion. Patient supine. Unable to complete study; patient kept pushing probe  aorta to 4.1 cm is noted. No dissection is seen. The pulmonary artery shows a normal branching pattern. No large central pulmonary embolus is noted. Coronary calcifications are noted. No cardiac enlargement is seen. Mediastinum/Nodes: Thoracic inlet is within normal limits. No hilar or mediastinal adenopathy is noted. The esophagus as visualized is within normal limits. Lungs/Pleura: Mild left lower lobe atelectatic changes are seen. No focal confluent infiltrate or sizable effusion is noted. Musculoskeletal: Degenerative changes of the thoracic spine are seen. No acute rib abnormality is noted. CT ABDOMEN PELVIS FINDINGS Hepatobiliary: Fatty infiltration of the liver is noted. Gallbladder is within normal limits. Pancreas: Unremarkable. No pancreatic ductal dilatation or surrounding inflammatory changes. Spleen: Normal in size without focal abnormality. Adrenals/Urinary Tract: Adrenal glands are within normal limits. Multiple simple appearing cysts are noted throughout both kidneys stable in appearance from the prior exam. No further follow-up is recommended. Scattered nonobstructing renal stones are seen. The ureters are within normal limits. The bladder is decompressed. Stomach/Bowel: The appendix is within normal limits. No obstructive or inflammatory changes of the colon are seen. The stomach and small bowel are within  normal limits. Vascular/Lymphatic: Aortic atherosclerosis. No enlarged abdominal or pelvic lymph nodes. Reproductive: Prostate is unremarkable. Other: No abdominal wall hernia or abnormality. No abdominopelvic ascites. Musculoskeletal: Degenerative changes of lumbar spine are noted. IMPRESSION: CT of the chest: Dilatation of the ascending aorta to 4.1 cm. Recommend annual imaging followup by CTA or MRA. This recommendation follows 2010 ACCF/AHA/AATS/ACR/ASA/SCA/SCAI/SIR/STS/SVM Guidelines for the Diagnosis and Management of Patients with Thoracic Aortic Disease. Circulation. 2010; 121: Z610-R604. Aortic aneurysm NOS (ICD10-I71.9) Mild left basilar atelectasis. CT of the abdomen and pelvis: Fatty infiltration of the liver. Nonobstructing renal calculi bilaterally. The largest of these is noted on the left measuring approximately 5 mm. Electronically Signed   By: Alcide Clever M.D.   On: 09/21/2023 21:33   ECHOCARDIOGRAM COMPLETE  Result Date: 09/21/2023    ECHOCARDIOGRAM REPORT   Patient Name:   Mathew Simpson Date of Exam: 09/21/2023 Medical Rec #:  [id number]     Height:       70.0 in Accession #:    [id number]    Weight:       195.0 lb Date of Birth:  1936-05-17      BSA:          2.065 m Patient Age:    87 years      BP:           107/74 mmHg Patient Gender: M             HR:           75 bpm. Exam Location:  Inpatient Procedure: 2D Echo, Cardiac Doppler and Color Doppler Indications:    Bacteremia R78.81  History:        Patient has prior history of Echocardiogram examinations, most                 recent 09/15/2023. Arrythmias:Atrial Fibrillation; Risk                 Factors:Diabetes, Hypertension and Former Smoker.  Sonographer:    Dondra Prader RVT RCS Referring Phys: [id number] ERIC J [place]  Sonographer Comments: Technically challenging study due to limited acoustic windows, Technically difficult study due to poor echo windows, suboptimal parasternal window and suboptimal apical window. Image acquisition  challenging due to uncooperative patient and Image acquisition challenging due to respiratory motion. Patient supine. Unable to complete study; patient kept pushing probe  Physician Discharge Summary   Patient: Mathew Simpson MRN: [id number] DOB: 1936/06/16  Admit date:     09/08/2023  Discharge date: 10/01/23  Discharge Physician: Arnetha Courser   PCP: [name], PA-C   Recommendations at discharge:  Please obtain weekly CBC, BMP, ESR and CRP Continue IV antibiotics until 11/09/2023 Please encourage proper nutrition and continue with calorie count to ensure that Please supplement his diet with boost breeze and Magic cups as recommended in the summary by our dietitian. Patient will need a dressing change as needed Follow-up with primary care provider Follow-up with orthopedic surgery Follow-up with infectious disease Follow-up with neurosurgery  Discharge Diagnoses: Principal Problem:   Prosthetic joint infection (HCC) Active Problems:   Essential hypertension   Chronic atrial fibrillation (HCC)   Bleeding in head following injury with loss of consciousness (HCC)   Subdural hematoma (HCC)   DM2 (diabetes mellitus, type 2) (HCC)   Neck injury   Fall   Scalp laceration   Bradycardia   Protein-calorie malnutrition, moderate (HCC)   SDH (subdural hematoma) (HCC)   Staphylococcus aureus bacteremia   Effusion of left knee  Resolved Problems:   * No resolved hospital problems. *  Hospital Course: Mathew Simpson is a 87 y.o. male with past medical history of  persistent atrial fibrillation on Eliquis, HTN, type 2 diabetes mellitus, vertigo  presented to the [facility] ED on 09/08/23 via EMS from home after mechanical fall at home, might have fallen backwards with loss of consciousness.  Found by neighbor.  Patient was brought in by EMS and was having dizziness nausea vomiting.  Patient was on Eliquis as outpatient.  In the ED, patient had stable vitals.  Labs showed creatinine of 1.3.  Alcohol level less than 10.  Procalcitonin less than 0.10.  CK 75.  Chest x-ray with low lung volumes.  Pelvis x-ray negative.  CT head and C-spine without contrast with 3 mm  subdural hematoma anterior falx, no midline shift, 6 mm hemorrhagic contusion posterior left frontal lobe, asymmetric widening of the space between the dens and left lateral mass of the C1 relative the right lateral mass. Patient was seen by neurosurgery. Follow-up CT scans demonstrated stable appearance small anterior falcine subdural and left frontal contusion does not require any acute intervention. Dr. Conchita Paris; with neurosurgery recommended  holding Eliquis for 2 weeks, no requirement of cervical immobilization and outpatient follow-up in neurosurg clinic as needed.  Repeat CT head with interval decrease in subdural hematoma.   During hospitalization, patient was noted to have left knee pain swelling and effusion.  Arthrocentesis showed MSSA septic arthritis.  At this time patient has undergone surgical intervention by orthopedics with irrigation and debridement and poly exchange on 09/28/2023.  Cultures remain negative so far and clinically seems improving.  He will need a dressing change as needed and need to have a close follow-up with orthopedic surgery for further recommendations.  Patient is already on antibiotics for MSSA bacteremia.  Patient was found to have MSSA bacteremia on initial admission on 09/20/2023.Chest x-ray without any infiltrate. CT scan of the chest abdomen pelvis without any acute findings. TTE with LVEF 55-60%, RV systolic function mildly decreased, biatrial enlargement, no notation of valvular vegetation although technically difficult study. ID was consulted.  Repeat blood cultures from 09/22/2023 remain negative.  Procalcitonin initially elevated but trending down.  Initial left knee aspirate with MSSA.  ID recommend continuation of cefazolin until 11/09/2023.  PICC line was placed and he will continue with  Physician Discharge Summary   Patient: Mathew Simpson MRN: [id number] DOB: 1936/06/16  Admit date:     09/08/2023  Discharge date: 10/01/23  Discharge Physician: Arnetha Courser   PCP: [name], PA-C   Recommendations at discharge:  Please obtain weekly CBC, BMP, ESR and CRP Continue IV antibiotics until 11/09/2023 Please encourage proper nutrition and continue with calorie count to ensure that Please supplement his diet with boost breeze and Magic cups as recommended in the summary by our dietitian. Patient will need a dressing change as needed Follow-up with primary care provider Follow-up with orthopedic surgery Follow-up with infectious disease Follow-up with neurosurgery  Discharge Diagnoses: Principal Problem:   Prosthetic joint infection (HCC) Active Problems:   Essential hypertension   Chronic atrial fibrillation (HCC)   Bleeding in head following injury with loss of consciousness (HCC)   Subdural hematoma (HCC)   DM2 (diabetes mellitus, type 2) (HCC)   Neck injury   Fall   Scalp laceration   Bradycardia   Protein-calorie malnutrition, moderate (HCC)   SDH (subdural hematoma) (HCC)   Staphylococcus aureus bacteremia   Effusion of left knee  Resolved Problems:   * No resolved hospital problems. *  Hospital Course: Mathew Simpson is a 87 y.o. male with past medical history of  persistent atrial fibrillation on Eliquis, HTN, type 2 diabetes mellitus, vertigo  presented to the [facility] ED on 09/08/23 via EMS from home after mechanical fall at home, might have fallen backwards with loss of consciousness.  Found by neighbor.  Patient was brought in by EMS and was having dizziness nausea vomiting.  Patient was on Eliquis as outpatient.  In the ED, patient had stable vitals.  Labs showed creatinine of 1.3.  Alcohol level less than 10.  Procalcitonin less than 0.10.  CK 75.  Chest x-ray with low lung volumes.  Pelvis x-ray negative.  CT head and C-spine without contrast with 3 mm  subdural hematoma anterior falx, no midline shift, 6 mm hemorrhagic contusion posterior left frontal lobe, asymmetric widening of the space between the dens and left lateral mass of the C1 relative the right lateral mass. Patient was seen by neurosurgery. Follow-up CT scans demonstrated stable appearance small anterior falcine subdural and left frontal contusion does not require any acute intervention. Dr. Conchita Paris; with neurosurgery recommended  holding Eliquis for 2 weeks, no requirement of cervical immobilization and outpatient follow-up in neurosurg clinic as needed.  Repeat CT head with interval decrease in subdural hematoma.   During hospitalization, patient was noted to have left knee pain swelling and effusion.  Arthrocentesis showed MSSA septic arthritis.  At this time patient has undergone surgical intervention by orthopedics with irrigation and debridement and poly exchange on 09/28/2023.  Cultures remain negative so far and clinically seems improving.  He will need a dressing change as needed and need to have a close follow-up with orthopedic surgery for further recommendations.  Patient is already on antibiotics for MSSA bacteremia.  Patient was found to have MSSA bacteremia on initial admission on 09/20/2023.Chest x-ray without any infiltrate. CT scan of the chest abdomen pelvis without any acute findings. TTE with LVEF 55-60%, RV systolic function mildly decreased, biatrial enlargement, no notation of valvular vegetation although technically difficult study. ID was consulted.  Repeat blood cultures from 09/22/2023 remain negative.  Procalcitonin initially elevated but trending down.  Initial left knee aspirate with MSSA.  ID recommend continuation of cefazolin until 11/09/2023.  PICC line was placed and he will continue with  opacities or effusions. No acute bony abnormality. IMPRESSION: No active disease. Electronically Signed   By: Charlett Nose M.D.   On: 09/20/2023 21:43   CT HEAD WO CONTRAST ( )  Result Date: 09/16/2023 CLINICAL DATA:  Subdural hematoma Worsening mental status; follow-up stability of previous SDH EXAM: CT HEAD WITHOUT CONTRAST TECHNIQUE: Contiguous axial images were obtained from the base of the skull through the vertex without intravenous contrast. RADIATION DOSE REDUCTION: This exam was performed according to the departmental dose-optimization program which includes automated exposure control, adjustment of the mA and/or kV according to patient size and/or use of iterative reconstruction technique. COMPARISON:  CT head 09/09/2023. FINDINGS: Motion limited study.  Within this limitation: Brain: Similar 3 mm subdural hemorrhage along the falx. Similar size and decreased density of a 6 mm hemorrhagic contusion in the posterior left frontal lobe at the vertex. No evidence of new/interval acute hemorrhage, mass lesion, midline shift or hydrocephalus. 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 Calcific atherosclerosis. Skull: No acute fracture. Sinuses/Orbits: Clear sinuses.  No acute orbital findings. Other: No mastoid effusions. IMPRESSION: 1. Similar 3 mm subdural hemorrhage along the falx. 2. Similar size and decreased density of a 6 mm hemorrhagic contusion in the posterior left frontal lobe at  the vertex. 3. 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 Electronically Signed   By: Feliberto Harts M.D.   On: 09/16/2023 15:49   ECHOCARDIOGRAM COMPLETE  Result Date: 09/15/2023    ECHOCARDIOGRAM REPORT   Patient Name:   Mathew Simpson Date of Exam: 09/15/2023 Medical Rec #:  [id number]     Height:       70.0 in Accession #:    [id number]    Weight:       195.0 lb Date of Birth:  07-12-1936      BSA:          2.065 m Patient Age:    87 years      BP:           153/77 mmHg Patient Gender: M             HR:           81 bpm. Exam Location:  Inpatient Procedure: 2D Echo, Cardiac Doppler and Color Doppler Indications:    Abnormal ECG R94.31  History:        Patient has no prior history of Echocardiogram examinations.                 Arrythmias:Atrial Fibrillation; Risk Factors:Hypertension and                 Diabetes.  Sonographer:    Lucendia Herrlich Referring Phys: [id number] CAROLE N HALL  Sonographer Comments: Image acquisition challenging due to patient body habitus. IMPRESSIONS  1. Left ventricular ejection fraction, by estimation, is 65 to 70%. The left ventricle has normal function. The left ventricle has no regional wall motion abnormalities. There is mild concentric left ventricular hypertrophy. Left ventricular diastolic parameters are indeterminate.  2. Right ventricular systolic function is normal. The right ventricular size is severely enlarged. Tricuspid regurgitation signal is inadequate for assessing PA pressure.  3. Left atrial size was mildly dilated.  4. Right atrial size was mildly dilated.  5. The mitral valve was not well visualized. No evidence of mitral valve regurgitation. No evidence of mitral stenosis.  6. Eccentric aortic regurgitation see in 5 chamber view. The aortic valve is tricuspid. Aortic valve regurgitation is mild. No aortic stenosis is present.  opacities or effusions. No acute bony abnormality. IMPRESSION: No active disease. Electronically Signed   By: Charlett Nose M.D.   On: 09/20/2023 21:43   CT HEAD WO CONTRAST ( )  Result Date: 09/16/2023 CLINICAL DATA:  Subdural hematoma Worsening mental status; follow-up stability of previous SDH EXAM: CT HEAD WITHOUT CONTRAST TECHNIQUE: Contiguous axial images were obtained from the base of the skull through the vertex without intravenous contrast. RADIATION DOSE REDUCTION: This exam was performed according to the departmental dose-optimization program which includes automated exposure control, adjustment of the mA and/or kV according to patient size and/or use of iterative reconstruction technique. COMPARISON:  CT head 09/09/2023. FINDINGS: Motion limited study.  Within this limitation: Brain: Similar 3 mm subdural hemorrhage along the falx. Similar size and decreased density of a 6 mm hemorrhagic contusion in the posterior left frontal lobe at the vertex. No evidence of new/interval acute hemorrhage, mass lesion, midline shift or hydrocephalus. 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 Calcific atherosclerosis. Skull: No acute fracture. Sinuses/Orbits: Clear sinuses.  No acute orbital findings. Other: No mastoid effusions. IMPRESSION: 1. Similar 3 mm subdural hemorrhage along the falx. 2. Similar size and decreased density of a 6 mm hemorrhagic contusion in the posterior left frontal lobe at  the vertex. 3. 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 Electronically Signed   By: Feliberto Harts M.D.   On: 09/16/2023 15:49   ECHOCARDIOGRAM COMPLETE  Result Date: 09/15/2023    ECHOCARDIOGRAM REPORT   Patient Name:   Mathew Simpson Date of Exam: 09/15/2023 Medical Rec #:  [id number]     Height:       70.0 in Accession #:    [id number]    Weight:       195.0 lb Date of Birth:  07-12-1936      BSA:          2.065 m Patient Age:    87 years      BP:           153/77 mmHg Patient Gender: M             HR:           81 bpm. Exam Location:  Inpatient Procedure: 2D Echo, Cardiac Doppler and Color Doppler Indications:    Abnormal ECG R94.31  History:        Patient has no prior history of Echocardiogram examinations.                 Arrythmias:Atrial Fibrillation; Risk Factors:Hypertension and                 Diabetes.  Sonographer:    Lucendia Herrlich Referring Phys: [id number] CAROLE N HALL  Sonographer Comments: Image acquisition challenging due to patient body habitus. IMPRESSIONS  1. Left ventricular ejection fraction, by estimation, is 65 to 70%. The left ventricle has normal function. The left ventricle has no regional wall motion abnormalities. There is mild concentric left ventricular hypertrophy. Left ventricular diastolic parameters are indeterminate.  2. Right ventricular systolic function is normal. The right ventricular size is severely enlarged. Tricuspid regurgitation signal is inadequate for assessing PA pressure.  3. Left atrial size was mildly dilated.  4. Right atrial size was mildly dilated.  5. The mitral valve was not well visualized. No evidence of mitral valve regurgitation. No evidence of mitral stenosis.  6. Eccentric aortic regurgitation see in 5 chamber view. The aortic valve is tricuspid. Aortic valve regurgitation is mild. No aortic stenosis is present.  aorta to 4.1 cm is noted. No dissection is seen. The pulmonary artery shows a normal branching pattern. No large central pulmonary embolus is noted. Coronary calcifications are noted. No cardiac enlargement is seen. Mediastinum/Nodes: Thoracic inlet is within normal limits. No hilar or mediastinal adenopathy is noted. The esophagus as visualized is within normal limits. Lungs/Pleura: Mild left lower lobe atelectatic changes are seen. No focal confluent infiltrate or sizable effusion is noted. Musculoskeletal: Degenerative changes of the thoracic spine are seen. No acute rib abnormality is noted. CT ABDOMEN PELVIS FINDINGS Hepatobiliary: Fatty infiltration of the liver is noted. Gallbladder is within normal limits. Pancreas: Unremarkable. No pancreatic ductal dilatation or surrounding inflammatory changes. Spleen: Normal in size without focal abnormality. Adrenals/Urinary Tract: Adrenal glands are within normal limits. Multiple simple appearing cysts are noted throughout both kidneys stable in appearance from the prior exam. No further follow-up is recommended. Scattered nonobstructing renal stones are seen. The ureters are within normal limits. The bladder is decompressed. Stomach/Bowel: The appendix is within normal limits. No obstructive or inflammatory changes of the colon are seen. The stomach and small bowel are within  normal limits. Vascular/Lymphatic: Aortic atherosclerosis. No enlarged abdominal or pelvic lymph nodes. Reproductive: Prostate is unremarkable. Other: No abdominal wall hernia or abnormality. No abdominopelvic ascites. Musculoskeletal: Degenerative changes of lumbar spine are noted. IMPRESSION: CT of the chest: Dilatation of the ascending aorta to 4.1 cm. Recommend annual imaging followup by CTA or MRA. This recommendation follows 2010 ACCF/AHA/AATS/ACR/ASA/SCA/SCAI/SIR/STS/SVM Guidelines for the Diagnosis and Management of Patients with Thoracic Aortic Disease. Circulation. 2010; 121: Z610-R604. Aortic aneurysm NOS (ICD10-I71.9) Mild left basilar atelectasis. CT of the abdomen and pelvis: Fatty infiltration of the liver. Nonobstructing renal calculi bilaterally. The largest of these is noted on the left measuring approximately 5 mm. Electronically Signed   By: Alcide Clever M.D.   On: 09/21/2023 21:33   ECHOCARDIOGRAM COMPLETE  Result Date: 09/21/2023    ECHOCARDIOGRAM REPORT   Patient Name:   Mathew Simpson Date of Exam: 09/21/2023 Medical Rec #:  [id number]     Height:       70.0 in Accession #:    [id number]    Weight:       195.0 lb Date of Birth:  1936-05-17      BSA:          2.065 m Patient Age:    87 years      BP:           107/74 mmHg Patient Gender: M             HR:           75 bpm. Exam Location:  Inpatient Procedure: 2D Echo, Cardiac Doppler and Color Doppler Indications:    Bacteremia R78.81  History:        Patient has prior history of Echocardiogram examinations, most                 recent 09/15/2023. Arrythmias:Atrial Fibrillation; Risk                 Factors:Diabetes, Hypertension and Former Smoker.  Sonographer:    Dondra Prader RVT RCS Referring Phys: [id number] ERIC J [place]  Sonographer Comments: Technically challenging study due to limited acoustic windows, Technically difficult study due to poor echo windows, suboptimal parasternal window and suboptimal apical window. Image acquisition  challenging due to uncooperative patient and Image acquisition challenging due to respiratory motion. Patient supine. Unable to complete study; patient kept pushing probe  aorta to 4.1 cm is noted. No dissection is seen. The pulmonary artery shows a normal branching pattern. No large central pulmonary embolus is noted. Coronary calcifications are noted. No cardiac enlargement is seen. Mediastinum/Nodes: Thoracic inlet is within normal limits. No hilar or mediastinal adenopathy is noted. The esophagus as visualized is within normal limits. Lungs/Pleura: Mild left lower lobe atelectatic changes are seen. No focal confluent infiltrate or sizable effusion is noted. Musculoskeletal: Degenerative changes of the thoracic spine are seen. No acute rib abnormality is noted. CT ABDOMEN PELVIS FINDINGS Hepatobiliary: Fatty infiltration of the liver is noted. Gallbladder is within normal limits. Pancreas: Unremarkable. No pancreatic ductal dilatation or surrounding inflammatory changes. Spleen: Normal in size without focal abnormality. Adrenals/Urinary Tract: Adrenal glands are within normal limits. Multiple simple appearing cysts are noted throughout both kidneys stable in appearance from the prior exam. No further follow-up is recommended. Scattered nonobstructing renal stones are seen. The ureters are within normal limits. The bladder is decompressed. Stomach/Bowel: The appendix is within normal limits. No obstructive or inflammatory changes of the colon are seen. The stomach and small bowel are within  normal limits. Vascular/Lymphatic: Aortic atherosclerosis. No enlarged abdominal or pelvic lymph nodes. Reproductive: Prostate is unremarkable. Other: No abdominal wall hernia or abnormality. No abdominopelvic ascites. Musculoskeletal: Degenerative changes of lumbar spine are noted. IMPRESSION: CT of the chest: Dilatation of the ascending aorta to 4.1 cm. Recommend annual imaging followup by CTA or MRA. This recommendation follows 2010 ACCF/AHA/AATS/ACR/ASA/SCA/SCAI/SIR/STS/SVM Guidelines for the Diagnosis and Management of Patients with Thoracic Aortic Disease. Circulation. 2010; 121: Z610-R604. Aortic aneurysm NOS (ICD10-I71.9) Mild left basilar atelectasis. CT of the abdomen and pelvis: Fatty infiltration of the liver. Nonobstructing renal calculi bilaterally. The largest of these is noted on the left measuring approximately 5 mm. Electronically Signed   By: Alcide Clever M.D.   On: 09/21/2023 21:33   ECHOCARDIOGRAM COMPLETE  Result Date: 09/21/2023    ECHOCARDIOGRAM REPORT   Patient Name:   Mathew Simpson Date of Exam: 09/21/2023 Medical Rec #:  [id number]     Height:       70.0 in Accession #:    [id number]    Weight:       195.0 lb Date of Birth:  1936-05-17      BSA:          2.065 m Patient Age:    87 years      BP:           107/74 mmHg Patient Gender: M             HR:           75 bpm. Exam Location:  Inpatient Procedure: 2D Echo, Cardiac Doppler and Color Doppler Indications:    Bacteremia R78.81  History:        Patient has prior history of Echocardiogram examinations, most                 recent 09/15/2023. Arrythmias:Atrial Fibrillation; Risk                 Factors:Diabetes, Hypertension and Former Smoker.  Sonographer:    Dondra Prader RVT RCS Referring Phys: [id number] ERIC J [place]  Sonographer Comments: Technically challenging study due to limited acoustic windows, Technically difficult study due to poor echo windows, suboptimal parasternal window and suboptimal apical window. Image acquisition  challenging due to uncooperative patient and Image acquisition challenging due to respiratory motion. Patient supine. Unable to complete study; patient kept pushing probe  Physician Discharge Summary   Patient: Mathew Simpson MRN: [id number] DOB: 1936/06/16  Admit date:     09/08/2023  Discharge date: 10/01/23  Discharge Physician: Arnetha Courser   PCP: [name], PA-C   Recommendations at discharge:  Please obtain weekly CBC, BMP, ESR and CRP Continue IV antibiotics until 11/09/2023 Please encourage proper nutrition and continue with calorie count to ensure that Please supplement his diet with boost breeze and Magic cups as recommended in the summary by our dietitian. Patient will need a dressing change as needed Follow-up with primary care provider Follow-up with orthopedic surgery Follow-up with infectious disease Follow-up with neurosurgery  Discharge Diagnoses: Principal Problem:   Prosthetic joint infection (HCC) Active Problems:   Essential hypertension   Chronic atrial fibrillation (HCC)   Bleeding in head following injury with loss of consciousness (HCC)   Subdural hematoma (HCC)   DM2 (diabetes mellitus, type 2) (HCC)   Neck injury   Fall   Scalp laceration   Bradycardia   Protein-calorie malnutrition, moderate (HCC)   SDH (subdural hematoma) (HCC)   Staphylococcus aureus bacteremia   Effusion of left knee  Resolved Problems:   * No resolved hospital problems. *  Hospital Course: Mathew Simpson is a 87 y.o. male with past medical history of  persistent atrial fibrillation on Eliquis, HTN, type 2 diabetes mellitus, vertigo  presented to the [facility] ED on 09/08/23 via EMS from home after mechanical fall at home, might have fallen backwards with loss of consciousness.  Found by neighbor.  Patient was brought in by EMS and was having dizziness nausea vomiting.  Patient was on Eliquis as outpatient.  In the ED, patient had stable vitals.  Labs showed creatinine of 1.3.  Alcohol level less than 10.  Procalcitonin less than 0.10.  CK 75.  Chest x-ray with low lung volumes.  Pelvis x-ray negative.  CT head and C-spine without contrast with 3 mm  subdural hematoma anterior falx, no midline shift, 6 mm hemorrhagic contusion posterior left frontal lobe, asymmetric widening of the space between the dens and left lateral mass of the C1 relative the right lateral mass. Patient was seen by neurosurgery. Follow-up CT scans demonstrated stable appearance small anterior falcine subdural and left frontal contusion does not require any acute intervention. Dr. Conchita Paris; with neurosurgery recommended  holding Eliquis for 2 weeks, no requirement of cervical immobilization and outpatient follow-up in neurosurg clinic as needed.  Repeat CT head with interval decrease in subdural hematoma.   During hospitalization, patient was noted to have left knee pain swelling and effusion.  Arthrocentesis showed MSSA septic arthritis.  At this time patient has undergone surgical intervention by orthopedics with irrigation and debridement and poly exchange on 09/28/2023.  Cultures remain negative so far and clinically seems improving.  He will need a dressing change as needed and need to have a close follow-up with orthopedic surgery for further recommendations.  Patient is already on antibiotics for MSSA bacteremia.  Patient was found to have MSSA bacteremia on initial admission on 09/20/2023.Chest x-ray without any infiltrate. CT scan of the chest abdomen pelvis without any acute findings. TTE with LVEF 55-60%, RV systolic function mildly decreased, biatrial enlargement, no notation of valvular vegetation although technically difficult study. ID was consulted.  Repeat blood cultures from 09/22/2023 remain negative.  Procalcitonin initially elevated but trending down.  Initial left knee aspirate with MSSA.  ID recommend continuation of cefazolin until 11/09/2023.  PICC line was placed and he will continue with  aorta to 4.1 cm is noted. No dissection is seen. The pulmonary artery shows a normal branching pattern. No large central pulmonary embolus is noted. Coronary calcifications are noted. No cardiac enlargement is seen. Mediastinum/Nodes: Thoracic inlet is within normal limits. No hilar or mediastinal adenopathy is noted. The esophagus as visualized is within normal limits. Lungs/Pleura: Mild left lower lobe atelectatic changes are seen. No focal confluent infiltrate or sizable effusion is noted. Musculoskeletal: Degenerative changes of the thoracic spine are seen. No acute rib abnormality is noted. CT ABDOMEN PELVIS FINDINGS Hepatobiliary: Fatty infiltration of the liver is noted. Gallbladder is within normal limits. Pancreas: Unremarkable. No pancreatic ductal dilatation or surrounding inflammatory changes. Spleen: Normal in size without focal abnormality. Adrenals/Urinary Tract: Adrenal glands are within normal limits. Multiple simple appearing cysts are noted throughout both kidneys stable in appearance from the prior exam. No further follow-up is recommended. Scattered nonobstructing renal stones are seen. The ureters are within normal limits. The bladder is decompressed. Stomach/Bowel: The appendix is within normal limits. No obstructive or inflammatory changes of the colon are seen. The stomach and small bowel are within  normal limits. Vascular/Lymphatic: Aortic atherosclerosis. No enlarged abdominal or pelvic lymph nodes. Reproductive: Prostate is unremarkable. Other: No abdominal wall hernia or abnormality. No abdominopelvic ascites. Musculoskeletal: Degenerative changes of lumbar spine are noted. IMPRESSION: CT of the chest: Dilatation of the ascending aorta to 4.1 cm. Recommend annual imaging followup by CTA or MRA. This recommendation follows 2010 ACCF/AHA/AATS/ACR/ASA/SCA/SCAI/SIR/STS/SVM Guidelines for the Diagnosis and Management of Patients with Thoracic Aortic Disease. Circulation. 2010; 121: Z610-R604. Aortic aneurysm NOS (ICD10-I71.9) Mild left basilar atelectasis. CT of the abdomen and pelvis: Fatty infiltration of the liver. Nonobstructing renal calculi bilaterally. The largest of these is noted on the left measuring approximately 5 mm. Electronically Signed   By: Alcide Clever M.D.   On: 09/21/2023 21:33   ECHOCARDIOGRAM COMPLETE  Result Date: 09/21/2023    ECHOCARDIOGRAM REPORT   Patient Name:   Mathew Simpson Date of Exam: 09/21/2023 Medical Rec #:  [id number]     Height:       70.0 in Accession #:    [id number]    Weight:       195.0 lb Date of Birth:  1936-05-17      BSA:          2.065 m Patient Age:    87 years      BP:           107/74 mmHg Patient Gender: M             HR:           75 bpm. Exam Location:  Inpatient Procedure: 2D Echo, Cardiac Doppler and Color Doppler Indications:    Bacteremia R78.81  History:        Patient has prior history of Echocardiogram examinations, most                 recent 09/15/2023. Arrythmias:Atrial Fibrillation; Risk                 Factors:Diabetes, Hypertension and Former Smoker.  Sonographer:    Dondra Prader RVT RCS Referring Phys: [id number] ERIC J [place]  Sonographer Comments: Technically challenging study due to limited acoustic windows, Technically difficult study due to poor echo windows, suboptimal parasternal window and suboptimal apical window. Image acquisition  challenging due to uncooperative patient and Image acquisition challenging due to respiratory motion. Patient supine. Unable to complete study; patient kept pushing probe  Physician Discharge Summary   Patient: Mathew Simpson MRN: [id number] DOB: 1936/06/16  Admit date:     09/08/2023  Discharge date: 10/01/23  Discharge Physician: Arnetha Courser   PCP: [name], PA-C   Recommendations at discharge:  Please obtain weekly CBC, BMP, ESR and CRP Continue IV antibiotics until 11/09/2023 Please encourage proper nutrition and continue with calorie count to ensure that Please supplement his diet with boost breeze and Magic cups as recommended in the summary by our dietitian. Patient will need a dressing change as needed Follow-up with primary care provider Follow-up with orthopedic surgery Follow-up with infectious disease Follow-up with neurosurgery  Discharge Diagnoses: Principal Problem:   Prosthetic joint infection (HCC) Active Problems:   Essential hypertension   Chronic atrial fibrillation (HCC)   Bleeding in head following injury with loss of consciousness (HCC)   Subdural hematoma (HCC)   DM2 (diabetes mellitus, type 2) (HCC)   Neck injury   Fall   Scalp laceration   Bradycardia   Protein-calorie malnutrition, moderate (HCC)   SDH (subdural hematoma) (HCC)   Staphylococcus aureus bacteremia   Effusion of left knee  Resolved Problems:   * No resolved hospital problems. *  Hospital Course: Mathew Simpson is a 87 y.o. male with past medical history of  persistent atrial fibrillation on Eliquis, HTN, type 2 diabetes mellitus, vertigo  presented to the [facility] ED on 09/08/23 via EMS from home after mechanical fall at home, might have fallen backwards with loss of consciousness.  Found by neighbor.  Patient was brought in by EMS and was having dizziness nausea vomiting.  Patient was on Eliquis as outpatient.  In the ED, patient had stable vitals.  Labs showed creatinine of 1.3.  Alcohol level less than 10.  Procalcitonin less than 0.10.  CK 75.  Chest x-ray with low lung volumes.  Pelvis x-ray negative.  CT head and C-spine without contrast with 3 mm  subdural hematoma anterior falx, no midline shift, 6 mm hemorrhagic contusion posterior left frontal lobe, asymmetric widening of the space between the dens and left lateral mass of the C1 relative the right lateral mass. Patient was seen by neurosurgery. Follow-up CT scans demonstrated stable appearance small anterior falcine subdural and left frontal contusion does not require any acute intervention. Dr. Conchita Paris; with neurosurgery recommended  holding Eliquis for 2 weeks, no requirement of cervical immobilization and outpatient follow-up in neurosurg clinic as needed.  Repeat CT head with interval decrease in subdural hematoma.   During hospitalization, patient was noted to have left knee pain swelling and effusion.  Arthrocentesis showed MSSA septic arthritis.  At this time patient has undergone surgical intervention by orthopedics with irrigation and debridement and poly exchange on 09/28/2023.  Cultures remain negative so far and clinically seems improving.  He will need a dressing change as needed and need to have a close follow-up with orthopedic surgery for further recommendations.  Patient is already on antibiotics for MSSA bacteremia.  Patient was found to have MSSA bacteremia on initial admission on 09/20/2023.Chest x-ray without any infiltrate. CT scan of the chest abdomen pelvis without any acute findings. TTE with LVEF 55-60%, RV systolic function mildly decreased, biatrial enlargement, no notation of valvular vegetation although technically difficult study. ID was consulted.  Repeat blood cultures from 09/22/2023 remain negative.  Procalcitonin initially elevated but trending down.  Initial left knee aspirate with MSSA.  ID recommend continuation of cefazolin until 11/09/2023.  PICC line was placed and he will continue with  aorta to 4.1 cm is noted. No dissection is seen. The pulmonary artery shows a normal branching pattern. No large central pulmonary embolus is noted. Coronary calcifications are noted. No cardiac enlargement is seen. Mediastinum/Nodes: Thoracic inlet is within normal limits. No hilar or mediastinal adenopathy is noted. The esophagus as visualized is within normal limits. Lungs/Pleura: Mild left lower lobe atelectatic changes are seen. No focal confluent infiltrate or sizable effusion is noted. Musculoskeletal: Degenerative changes of the thoracic spine are seen. No acute rib abnormality is noted. CT ABDOMEN PELVIS FINDINGS Hepatobiliary: Fatty infiltration of the liver is noted. Gallbladder is within normal limits. Pancreas: Unremarkable. No pancreatic ductal dilatation or surrounding inflammatory changes. Spleen: Normal in size without focal abnormality. Adrenals/Urinary Tract: Adrenal glands are within normal limits. Multiple simple appearing cysts are noted throughout both kidneys stable in appearance from the prior exam. No further follow-up is recommended. Scattered nonobstructing renal stones are seen. The ureters are within normal limits. The bladder is decompressed. Stomach/Bowel: The appendix is within normal limits. No obstructive or inflammatory changes of the colon are seen. The stomach and small bowel are within  normal limits. Vascular/Lymphatic: Aortic atherosclerosis. No enlarged abdominal or pelvic lymph nodes. Reproductive: Prostate is unremarkable. Other: No abdominal wall hernia or abnormality. No abdominopelvic ascites. Musculoskeletal: Degenerative changes of lumbar spine are noted. IMPRESSION: CT of the chest: Dilatation of the ascending aorta to 4.1 cm. Recommend annual imaging followup by CTA or MRA. This recommendation follows 2010 ACCF/AHA/AATS/ACR/ASA/SCA/SCAI/SIR/STS/SVM Guidelines for the Diagnosis and Management of Patients with Thoracic Aortic Disease. Circulation. 2010; 121: Z610-R604. Aortic aneurysm NOS (ICD10-I71.9) Mild left basilar atelectasis. CT of the abdomen and pelvis: Fatty infiltration of the liver. Nonobstructing renal calculi bilaterally. The largest of these is noted on the left measuring approximately 5 mm. Electronically Signed   By: Alcide Clever M.D.   On: 09/21/2023 21:33   ECHOCARDIOGRAM COMPLETE  Result Date: 09/21/2023    ECHOCARDIOGRAM REPORT   Patient Name:   Mathew Simpson Date of Exam: 09/21/2023 Medical Rec #:  [id number]     Height:       70.0 in Accession #:    [id number]    Weight:       195.0 lb Date of Birth:  1936-05-17      BSA:          2.065 m Patient Age:    87 years      BP:           107/74 mmHg Patient Gender: M             HR:           75 bpm. Exam Location:  Inpatient Procedure: 2D Echo, Cardiac Doppler and Color Doppler Indications:    Bacteremia R78.81  History:        Patient has prior history of Echocardiogram examinations, most                 recent 09/15/2023. Arrythmias:Atrial Fibrillation; Risk                 Factors:Diabetes, Hypertension and Former Smoker.  Sonographer:    Dondra Prader RVT RCS Referring Phys: [id number] ERIC J [place]  Sonographer Comments: Technically challenging study due to limited acoustic windows, Technically difficult study due to poor echo windows, suboptimal parasternal window and suboptimal apical window. Image acquisition  challenging due to uncooperative patient and Image acquisition challenging due to respiratory motion. Patient supine. Unable to complete study; patient kept pushing probe

## 2023-10-01 NOTE — Progress Notes (Signed)
Called Tamasa, granddaughter at patient's request, and let her know that he was on his way to [facility].

## 2023-10-01 NOTE — Progress Notes (Signed)
Called [facility] to gave to [employer], LPN.

## 2023-10-01 NOTE — Plan of Care (Signed)
Discharge planned to SNF today.

## 2023-10-01 NOTE — Plan of Care (Signed)
Discharging to [facility].

## 2023-10-02 ENCOUNTER — Emergency Department (HOSPITAL_COMMUNITY): Payer: Medicare HMO

## 2023-10-02 ENCOUNTER — Other Ambulatory Visit: Payer: Self-pay

## 2023-10-02 ENCOUNTER — Encounter (HOSPITAL_COMMUNITY): Payer: Self-pay

## 2023-10-02 ENCOUNTER — Inpatient Hospital Stay (HOSPITAL_COMMUNITY)
Admission: EM | Admit: 2023-10-02 | Discharge: 2023-10-08 | DRG: 560 | Disposition: A | Discharge status: Skilled Nursing Facility | Payer: Medicare HMO | Source: Skilled Nursing Facility | Attending: Internal Medicine | Admitting: Internal Medicine

## 2023-10-02 DIAGNOSIS — H409 Unspecified glaucoma: Secondary | ICD-10-CM | POA: Diagnosis present

## 2023-10-02 DIAGNOSIS — Z1152 Encounter for screening for COVID-19: Secondary | ICD-10-CM

## 2023-10-02 DIAGNOSIS — T8484XA Pain due to internal orthopedic prosthetic devices, implants and grafts, initial encounter: Principal | ICD-10-CM | POA: Diagnosis present

## 2023-10-02 DIAGNOSIS — Z7901 Long term (current) use of anticoagulants: Secondary | ICD-10-CM

## 2023-10-02 DIAGNOSIS — E119 Type 2 diabetes mellitus without complications: Secondary | ICD-10-CM

## 2023-10-02 DIAGNOSIS — E440 Moderate protein-calorie malnutrition: Secondary | ICD-10-CM | POA: Diagnosis present

## 2023-10-02 DIAGNOSIS — I4819 Other persistent atrial fibrillation: Secondary | ICD-10-CM | POA: Diagnosis present

## 2023-10-02 DIAGNOSIS — R509 Fever, unspecified: Principal | ICD-10-CM | POA: Diagnosis present

## 2023-10-02 DIAGNOSIS — Z96653 Presence of artificial knee joint, bilateral: Secondary | ICD-10-CM | POA: Diagnosis present

## 2023-10-02 DIAGNOSIS — I10 Essential (primary) hypertension: Secondary | ICD-10-CM | POA: Diagnosis present

## 2023-10-02 DIAGNOSIS — Z79899 Other long term (current) drug therapy: Secondary | ICD-10-CM

## 2023-10-02 DIAGNOSIS — Z87820 Personal history of traumatic brain injury: Secondary | ICD-10-CM

## 2023-10-02 DIAGNOSIS — T8454XA Infection and inflammatory reaction due to internal left knee prosthesis, initial encounter: Secondary | ICD-10-CM

## 2023-10-02 DIAGNOSIS — Z880 Allergy status to penicillin: Secondary | ICD-10-CM

## 2023-10-02 DIAGNOSIS — Y792 Prosthetic and other implants, materials and accessory orthopedic devices associated with adverse incidents: Secondary | ICD-10-CM | POA: Diagnosis present

## 2023-10-02 DIAGNOSIS — Z6827 Body mass index (BMI) 27.0-27.9, adult: Secondary | ICD-10-CM

## 2023-10-02 DIAGNOSIS — Z66 Do not resuscitate: Secondary | ICD-10-CM | POA: Diagnosis present

## 2023-10-02 DIAGNOSIS — I482 Chronic atrial fibrillation, unspecified: Secondary | ICD-10-CM | POA: Diagnosis present

## 2023-10-02 DIAGNOSIS — I82611 Acute embolism and thrombosis of superficial veins of right upper extremity: Secondary | ICD-10-CM | POA: Diagnosis present

## 2023-10-02 DIAGNOSIS — M009 Pyogenic arthritis, unspecified: Secondary | ICD-10-CM | POA: Diagnosis present

## 2023-10-02 LAB — COMPREHENSIVE METABOLIC PANEL
ALT: 14 U/L (ref 0–44)
AST: 54 U/L — ABNORMAL HIGH (ref 15–41)
Albumin: 1.8 g/dL — ABNORMAL LOW (ref 3.5–5.0)
Alkaline Phosphatase: 113 U/L (ref 38–126)
Anion gap: 12 (ref 5–15)
BUN: 17 mg/dL (ref 8–23)
CO2: 26 mmol/L (ref 22–32)
Calcium: 7.7 mg/dL — ABNORMAL LOW (ref 8.9–10.3)
Chloride: 101 mmol/L (ref 98–111)
Creatinine, Ser: 0.94 mg/dL (ref 0.61–1.24)
GFR, Estimated: >60 mL/min (ref >60)
Glucose, Bld: 197 mg/dL — ABNORMAL HIGH (ref 70–99)
Potassium: 3.7 mmol/L (ref 3.5–5.1)
Sodium: 139 mmol/L (ref 135–145)
Total Bilirubin: 1 mg/dL (ref 0.3–1.2)
Total Protein: 5.7 g/dL — ABNORMAL LOW (ref 6.5–8.1)

## 2023-10-02 LAB — URINALYSIS, W/ REFLEX TO CULTURE (INFECTION SUSPECTED)
Bacteria, UA: NONE SEEN
Bilirubin Urine: NEGATIVE
Glucose, UA: 50 mg/dL — AB
Ketones, ur: NEGATIVE mg/dL
Nitrite: NEGATIVE
Protein, ur: 30 mg/dL — AB
RBC / HPF: >50 RBC/hpf (ref 0–5)
Specific Gravity, Urine: 1.018 (ref 1.005–1.030)
pH: 5 (ref 5.0–8.0)

## 2023-10-02 LAB — CBC WITH DIFFERENTIAL/PLATELET
Abs Immature Granulocytes: 0.05 10*3/uL (ref 0.00–0.07)
Basophils Absolute: 0 10*3/uL (ref 0.0–0.1)
Basophils Relative: 0 %
Eosinophils Absolute: 0.1 10*3/uL (ref 0.0–0.5)
Eosinophils Relative: 2 %
HCT: 28.2 % — ABNORMAL LOW (ref 39.0–52.0)
Hemoglobin: 9.1 g/dL — ABNORMAL LOW (ref 13.0–17.0)
Immature Granulocytes: 1 %
Lymphocytes Relative: 13 %
Lymphs Abs: 1 10*3/uL (ref 0.7–4.0)
MCH: 30.1 pg (ref 26.0–34.0)
MCHC: 32.3 g/dL (ref 30.0–36.0)
MCV: 93.4 fL (ref 80.0–100.0)
Monocytes Absolute: 0.5 10*3/uL (ref 0.1–1.0)
Monocytes Relative: 6 %
Neutro Abs: 5.9 10*3/uL (ref 1.7–7.7)
Neutrophils Relative %: 78 %
Platelets: 264 10*3/uL (ref 150–400)
RBC: 3.02 MIL/uL — ABNORMAL LOW (ref 4.22–5.81)
RDW: 14.2 % (ref 11.5–15.5)
WBC: 7.5 10*3/uL (ref 4.0–10.5)
nRBC: 0 % (ref 0.0–0.2)

## 2023-10-02 LAB — RESP PANEL BY RT-PCR (RSV, FLU A&B, COVID)  RVPGX2
Influenza A by PCR: NEGATIVE
Influenza B by PCR: NEGATIVE
Resp Syncytial Virus by PCR: NEGATIVE
SARS Coronavirus 2 by RT PCR: NEGATIVE

## 2023-10-02 LAB — PROTIME-INR
INR: 3.4 — ABNORMAL HIGH (ref 0.8–1.2)
Prothrombin Time: 34.3 s — ABNORMAL HIGH (ref 11.4–15.2)

## 2023-10-02 LAB — I-STAT CG4 LACTIC ACID, ED
Lactic Acid, Venous: 2.1 mmol/L (ref 0.5–1.9)
Lactic Acid, Venous: 1.8 mmol/L (ref 0.5–1.9)

## 2023-10-02 LAB — APTT: aPTT: 55 s — ABNORMAL HIGH (ref 24–36)

## 2023-10-02 MED ORDER — CEFAZOLIN SODIUM-DEXTROSE 2-4 GM/100ML-% IV SOLN
2.0000 g | Freq: Once | INTRAVENOUS | Status: AC
  Administered 2023-10-02: 2 g via INTRAVENOUS
  Filled 2023-10-02: qty 100

## 2023-10-02 MED ORDER — LACTATED RINGERS IV BOLUS
1000.0000 mL | Freq: Once | INTRAVENOUS | Status: AC
  Administered 2023-10-02: 1000 mL via INTRAVENOUS

## 2023-10-02 NOTE — H&P (Signed)
HISTORY AND PHYSICAL    CRUIZ Mathew Simpson   ZOX:[id number] DOB: 10/31/1936   Date of Service: 10/02/23 Requesting physician/APP from ED: Treatment Team:  Attending Provider: Lonell Grandchild, MD Physician Assistant: Marita Kansas, PA-C  PCP: [name], PA-C     HPI: Mathew Simpson is a 87 y.o. male with PMH atrial fibrillation on Eliquis and diltiazem, HTN, osteoarthritis and recent treatment for septic prosthetic joint, see below.  Presented today to the ED from SNF rehab with fever.  Of note, recently admitted to hospital 09/08/2023 and discharged 10/01/2023, yesterday.  On that admission, treated for subdural hematoma status post fall anticoagulation was held for 2 weeks, prosthetic joint infection - underwent irrigation/debridement and liner exchange left prosthetic joint on 09/28/2023.  Was discharged yesterday to SNF with PICC line on IV antibiotics -cefazolin IV planned until 11/09/2023.   In ED, EMS reports fever today of 101.4.  VSS other than increased respiratory rate, no leukocytosis, lactic acid initial mild elevation 2.1 but on repeat 1.8 after receiving 1 L bolus LR.  Otherwise no significant concerns on BMP or CBC, no leukocytosis, mild stable anemia Hgb 9.1.  EDP discussed with orthopedic surgeon, recommending medicine admission and continuing cefazolin and they will follow patient in the a.m.    Consultants:  Orthopedics  Procedures: None      ASSESSMENT & PLAN:   Fever 101.4 following recent orthopedic procedure on septic left knee History of recent septic arthritis L knee, MSSA bacteremia Normal WBC, increased respiratory rate mild in question baseline, elevated lactic acid question dehydration versus sepsis, sepsis does not seem likely given overall clinical picture however cannot be ruled out Continue IV antibiotics with cefazolin Blood cultures obtained, pending Will not continue IV fluids in setting of national shortage and patient hemodynamically  stable Orthopedics to follow  Persistent atrial fibrillation Recent 2D echo on previous admission Telemetry Maintain home dose Eliquis, diltiazem, metoprolol  Hypertension Holding home irbesartan at this time given soft BP, continue diltiazem and metoprolol as above for A-fib rate control  DM2 Most recent A1c 6.7 Good glycemic control, monitor fasting GLC daily  Moderate protein calorie malnutrition Continue multivitamin Regular diet pending swallow evaluation     DVT prophylaxis: Eliquis  Pertinent IV fluids/nutrition: No continuous IV fluids, diet pending swallow evaluation Central lines / invasive devices: None  Code Status: DNR, [place] DNR form recently signed was reviewed in the ED Family Communication: None at this time  Disposition: Observation, may anticipate transition to inpatient pending orthopedic evaluation/needing any procedures TOC needs: None at this time, likely will go back to SNF rehab Barriers to discharge / significant pending items: Orthopedic consultation, clinical improvement                  Review of Systems: note - he is somnolent but awakens to voice, complaints of being cold but no other concerns when asked, he will state yes/no but that he wants to go back to sleep  Review of Systems  Unable to perform ROS: Mental acuity  Constitutional:  Positive for chills.  Cardiovascular:  Negative for chest pain and leg swelling.  Musculoskeletal:  Positive for joint pain (asked about pain he says "no" but does report tenderness on palpation of L knee).       has a past medical history of Arthritis, DJD (degenerative joint disease), Dysrhythmia, Glaucoma, and Hypertension.  No current facility-administered medications on file prior to encounter.   Current Outpatient Medications on File Prior to Encounter  Medication Sig Dispense Refill   acetaminophen (TYLENOL) 325 MG tablet Take 2 tablets (650 mg total) by mouth every 6 (six)  hours as needed for mild pain (or Fever >/= 101).     apixaban (ELIQUIS) 5 MG TABS tablet Take 5 mg by mouth 2 (two) times daily.     bisacodyl (DULCOLAX) 10 MG suppository Place 1 suppository (10 mg total) rectally daily as needed for moderate constipation.     Calcium Citrate-Vitamin D (CALCIUM + D PO) Take 1 tablet by mouth 2 (two) times daily.     ceFAZolin (ANCEF) IVPB Inject 2 g into the vein every 8 (eight) hours. Indication:  MSSA L-knee PJI First Dose: Yes Last Day of Therapy:  11/09/23 Labs - Once weekly:  CBC/D and BMP, Labs - Once weekly: ESR and CRP Method of administration: IV Push Method of administration may be changed at the discretion of home infusion pharmacist based upon assessment of the patient and/or caregiver's ability to self-administer the medication ordered. 123 Units 0   diltiazem (CARDIZEM CD) 120 MG 24 hr capsule Take 1 capsule (120 mg total) by mouth daily.     diphenhydrAMINE (BENADRYL) 12.5 MG/5ML elixir Take 5-10 mLs (12.5-25 mg total) by mouth every 4 (four) hours as needed for itching. (Patient taking differently: Take 10 mLs by mouth every 4 (four) hours as needed for itching.)     docusate sodium (COLACE) 100 MG capsule Take 1 capsule (100 mg total) by mouth 2 (two) times daily.     EPINEPHrine 0.3 mg/0.3 mL IJ SOAJ injection Inject 0.3 mg into the muscle once as needed (if patient exhibits significant signs and symptoms of allergic reaction.).     HYDROcodone-acetaminophen (NORCO/VICODIN) 5-325 MG tablet Take 1-2 tablets by mouth every 4 (four) hours as needed for moderate pain. (Patient taking differently: Take 1 tablet by mouth every 4 (four) hours as needed for moderate pain.) 30 tablet 0   irbesartan (AVAPRO) 300 MG tablet Take 1 tablet (300 mg total) by mouth daily.     magnesium hydroxide (MILK OF MAGNESIA) 400 MG/5ML suspension Take 30 mLs by mouth daily as needed for mild constipation.     melatonin 3 MG TABS tablet Take 2 tablets (6 mg total) by mouth  at bedtime.     metoprolol tartrate (LOPRESSOR) 25 MG tablet Take 0.5 tablets (12.5 mg total) by mouth 2 (two) times daily.     Multiple Vitamin (MULTIVITAMIN WITH MINERALS) TABS tablet Take 1 tablet by mouth daily at 2 PM.      Multiple Vitamins-Minerals (PRESERVISION AREDS 2 PO) Take 1 tablet by mouth in the morning and at bedtime.     Nystatin (GERHARDT'S BUTT CREAM) CREA Apply 1 Application topically 3 (three) times daily.     ondansetron (ZOFRAN) 4 MG tablet Take 1 tablet (4 mg total) by mouth every 6 (six) hours as needed for nausea.     polyethylene glycol (MIRALAX / GLYCOLAX) 17 g packet Take 17 g by mouth daily as needed for mild constipation.     QUEtiapine (SEROQUEL) 25 MG tablet Take 0.5 tablets (12.5 mg total) by mouth at bedtime.     UNABLE TO FIND Take 1 application  by mouth in the morning, at noon, and at bedtime. Med Name: magic cup supplement     Cholecalciferol (DIALYVITE VITAMIN D 5000) 125 MCG (5000 UT) capsule Take 5,000 Units by mouth every evening.  (Patient not taking: Reported on 10/02/2023)     diltiazem (CARDIZEM LA) 180 MG  24 hr tablet Take 180 mg by mouth daily. (Patient not taking: Reported on 10/02/2023)     Vitamin D, Ergocalciferol, (DRISDOL) 1.25 MG (50000 UNIT) CAPS capsule Take 50,000 Units by mouth once a week. (Patient not taking: Reported on 10/02/2023)       Allergies  Allergen Reactions   Penicillins Anaphylaxis    Tolerates ancef and cephalexin  Did it involve swelling of the face/tongue/throat, SOB, or low BP? Yes Did it involve sudden or severe rash/hives, skin peeling, or any reaction on the inside of your mouth or nose? Unknown Did you need to seek medical attention at a hospital or doctor's office? Yes When did it last happen?      50+ years If all above answers are "NO", may proceed with cephalosporin use.       family history includes Hypertension in an other family member.  Past Surgical History:  Procedure Laterality Date   EYE  SURGERY     bil    I & D KNEE WITH POLY EXCHANGE Left 09/28/2023   Procedure: IRRIGATION AND DEBRIDEMENT KNEE WITH POLY EXCHANGE;  Surgeon: Ollen Gross, MD;  Location: [facility] OR;  Service: Orthopedics;  Laterality: Left;   JOINT REPLACEMENT     knee surgery     arthroscopic   TOTAL KNEE ARTHROPLASTY Left 09/12/2019   Procedure: TOTAL KNEE ARTHROPLASTY;  Surgeon: Ollen Gross, MD;  Location: [facility] ORS;  Service: Orthopedics;  Laterality: Left;    TOTAL KNEE ARTHROPLASTY Right 03/12/2020   Procedure: TOTAL KNEE ARTHROPLASTY;  Surgeon: Ollen Gross, MD;  Location: [facility] OR[facility];  Service: Orthopedics;  Laterality: Right;           Objective Findings:  Vitals:   10/02/23 2030 10/02/23 2045 10/02/23 2100 10/02/23 2314  BP: 123/67 133/68 132/77   Pulse: 82 83 75   Resp: (!) 25 (!) 23 (!) 21   Temp:    98.2 F (36.8 C)  TempSrc:    Oral  SpO2: 97% 100% 100%   Weight:      Height:       No intake or output data in the 24 hours ending 10/02/23 2350 Filed Weights   10/02/23 1905  Weight: 86.2 kg    Examination:  Physical Exam Constitutional:      General: He is not in acute distress.    Appearance: He is not ill-appearing.  Cardiovascular:     Rate and Rhythm: Normal rate. Rhythm irregular.  Pulmonary:     Effort: Pulmonary effort is normal.     Breath sounds: Normal breath sounds.  Abdominal:     General: Abdomen is flat.     Palpations: Abdomen is soft.  Musculoskeletal:     Left lower leg: Edema (swelling and erythema around L knee) present.  Skin:    General: Skin is warm and dry.  Neurological:     Mental Status: He is disoriented.     Comments: Oriented to year, cannot answer why he is here, cannot answer who is president of [place]  Psychiatric:        Behavior: Behavior normal.          Scheduled Medications:    Continuous Infusions:   PRN Medications:    Antimicrobials:  Anti-infectives (From admission, onward)    Start     Dose/Rate Route  Frequency Ordered Stop   10/02/23 2300  ceFAZolin (ANCEF) IVPB 2g/100 mL premix        2 g 200 mL/hr over 30 Minutes  Intravenous  Once 10/02/23 2256 10/02/23 2334           Data Reviewed: I have personally reviewed following labs and imaging studies  CBC: Recent Labs  Lab 09/26/23 0357 09/27/23 0550 09/29/23 0505 09/30/23 0437 10/02/23 1930  WBC 8.1 9.4 15.3* 10.2 7.5  NEUTROABS  --   --   --   --  5.9  HGB 11.7* 11.5* 12.2* 9.6* 9.1*  HCT 35.4* 33.9* 37.6* 28.8* 28.2*  MCV 90.5 91.9 94.5 93.5 93.4  PLT 150 181 248 216 264   Basic Metabolic Panel: Recent Labs  Lab 09/26/23 0357 09/27/23 0550 09/29/23 0505 09/30/23 0437 10/02/23 1930  NA 141 141 139 141 139  K 3.2* 3.8 3.8 3.1* 3.7  CL 105 100 100 103 101  CO2 24 25 25 29 26   GLUCOSE 149* 145* 189* 132* 197*  BUN 19 16 19 21 17   CREATININE 0.96 0.90 1.17 1.09 0.94  CALCIUM 7.8* 7.9* 7.9* 7.6* 7.7*  MG 2.1  --  2.0 2.1  --    GFR: Estimated Creatinine Clearance: 59.3 mL/min (by C-G formula based on SCr of 0.94 mg/dL). Liver Function Tests: Recent Labs  Lab 10/02/23 1930  AST 54*  ALT 14  ALKPHOS 113  BILITOT 1.0  PROT 5.7*  ALBUMIN 1.8*   No results for input(s): "LIPASE", "AMYLASE" in the last 168 hours. No results for input(s): "AMMONIA" in the last 168 hours. Coagulation Profile: Recent Labs  Lab 10/02/23 1930  INR 3.4*   Cardiac Enzymes: No results for input(s): "CKTOTAL", "CKMB", "CKMBINDEX", "TROPONINI" in the last 168 hours. BNP (last 3 results) No results for input(s): "PROBNP" in the last 8760 hours. HbA1C: No results for input(s): "HGBA1C" in the last 72 hours. CBG: Recent Labs  Lab 09/30/23 1045 09/30/23 1639 09/30/23 2128 10/01/23 0609 10/01/23 1118  GLUCAP 259* 198* 139* 141* 217*   Lipid Profile: No results for input(s): "CHOL", "HDL", "LDLCALC", "TRIG", "CHOLHDL", "LDLDIRECT" in the last 72 hours. Thyroid Function Tests: No results for input(s): "TSH", "T4TOTAL",  "FREET4", "T3FREE", "THYROIDAB" in the last 72 hours. Anemia Panel: No results for input(s): "VITAMINB12", "FOLATE", "FERRITIN", "TIBC", "IRON", "RETICCTPCT" in the last 72 hours. Most Recent Urinalysis On File:     Component Value Date/Time   COLORURINE YELLOW 10/02/2023 2227   APPEARANCEUR HAZY (A) 10/02/2023 2227   LABSPEC 1.018 10/02/2023 2227   PHURINE 5.0 10/02/2023 2227   GLUCOSEU 50 (A) 10/02/2023 2227   HGBUR LARGE (A) 10/02/2023 2227   BILIRUBINUR NEGATIVE 10/02/2023 2227   KETONESUR NEGATIVE 10/02/2023 2227   PROTEINUR 30 (A) 10/02/2023 2227   NITRITE NEGATIVE 10/02/2023 2227   LEUKOCYTESUR TRACE (A) 10/02/2023 2227   Sepsis Labs: @LABRCNTIP (procalcitonin:4,lacticidven:4)  Recent Results (from the past 240 hour(s))  Body fluid culture w Gram Stain     Status: None   Collection Time: 09/23/23  2:20 PM   Specimen: Synovium; Body Fluid  Result Value Ref Range Status   Specimen Description SYNOVIAL LEFT KNEE  Final   Special Requests NONE  Final   Gram Stain   Final    ABUNDANT WBC PRESENT, PREDOMINANTLY PMN FEW GRAM POSITIVE COCCI IN PAIRS CRITICAL RESULT CALLED TO, READ BACK BY AND VERIFIED WITH: RN ALEXIS MORGAN ON 09/23/23 @ 1909 BY DRT Performed at [facility], [address], [place], [place] [address]    Culture MODERATE STAPHYLOCOCCUS AUREUS  Final   Report Status 09/25/2023 FINAL  Final   Organism ID, Bacteria STAPHYLOCOCCUS AUREUS  Final  Susceptibility   Staphylococcus aureus - MIC*    CIPROFLOXACIN <=0.5 SENSITIVE Sensitive     ERYTHROMYCIN <=0.25 SENSITIVE Sensitive     GENTAMICIN <=0.5 SENSITIVE Sensitive     OXACILLIN 0.5 SENSITIVE Sensitive     TETRACYCLINE <=1 SENSITIVE Sensitive     VANCOMYCIN <=0.5 SENSITIVE Sensitive     TRIMETH/SULFA <=10 SENSITIVE Sensitive     CLINDAMYCIN <=0.25 SENSITIVE Sensitive     RIFAMPIN <=0.5 SENSITIVE Sensitive     Inducible Clindamycin NEGATIVE Sensitive     LINEZOLID 2 SENSITIVE Sensitive     *  MODERATE STAPHYLOCOCCUS AUREUS  Aerobic/Anaerobic Culture w Gram Stain (surgical/deep wound)     Status: None (Preliminary result)   Collection Time: 09/28/23  5:00 PM   Specimen: Path fluid; Body Fluid  Result Value Ref Range Status   Specimen Description FLUID  Final   Special Requests left knee infection  Final   Gram Stain   Final    RARE WBC PRESENT, PREDOMINANTLY PMN NO ORGANISMS SEEN Performed at [facility], [address], [place], [place] [address]    Culture   Final    RARE STAPHYLOCOCCUS AUREUS SUSCEPTIBILITIES TO FOLLOW NO ANAEROBES ISOLATED; CULTURE IN PROGRESS FOR 5 DAYS    Report Status PENDING  Incomplete  Resp panel by RT-PCR (RSV, Flu A&B, Covid) Anterior Nasal Swab     Status: None   Collection Time: 10/02/23  7:25 PM   Specimen: Anterior Nasal Swab  Result Value Ref Range Status   SARS Coronavirus 2 by RT PCR NEGATIVE NEGATIVE Final   Influenza A by PCR NEGATIVE NEGATIVE Final   Influenza B by PCR NEGATIVE NEGATIVE Final    Comment: (NOTE) The Xpert Xpress SARS-CoV-2/FLU/RSV plus assay is intended as an aid in the diagnosis of influenza from Nasopharyngeal swab specimens and should not be used as a sole basis for treatment. Nasal washings and aspirates are unacceptable for Xpert Xpress SARS-CoV-2/FLU/RSV testing.  Fact Sheet for Patients: BloggerCourse.com  Fact Sheet for Healthcare Providers: SeriousBroker.it  This test is not yet approved or cleared by the [place] FDA and has been authorized for detection and/or diagnosis of SARS-CoV-2 by FDA under an Emergency Use Authorization (EUA). This EUA will remain in effect (meaning this test can be used) for the duration of the COVID-19 declaration under Section 564(b)(1) of the Act, 21 U.S.C. section 360bbb-3(b)(1), unless the authorization is terminated or revoked.     Resp Syncytial Virus by PCR NEGATIVE NEGATIVE Final    Comment:  (NOTE) Fact Sheet for Patients: BloggerCourse.com  Fact Sheet for Healthcare Providers: SeriousBroker.it  This test is not yet approved or cleared by the [place] FDA and has been authorized for detection and/or diagnosis of SARS-CoV-2 by FDA under an Emergency Use Authorization (EUA). This EUA will remain in effect (meaning this test can be used) for the duration of the COVID-19 declaration under Section 564(b)(1) of the Act, 21 U.S.C. section 360bbb-3(b)(1), unless the authorization is terminated or revoked.  Performed at [facility], [address], [place], [place] [address]          Radiology Studies: DG Chest Port 1 View  Result Date: 10/02/2023 CLINICAL DATA:  Questionable sepsis EXAM: PORTABLE CHEST 1 VIEW COMPARISON:  09/29/2023 FINDINGS: Heart borderline in size. Mediastinal contours within normal limits. Aortic atherosclerosis. Lungs clear. No effusions. Right PICC line in place with the tip in the SVC. IMPRESSION: No active disease. Electronically Signed   By: Charlett Nose M.D.  On: 10/02/2023 22:09   DG Chest Port 1 View  Result Date: 09/29/2023 CLINICAL DATA:  PICC line placement EXAM: PORTABLE CHEST 1 VIEW COMPARISON:  09/20/2023 FINDINGS: Single frontal view of the chest demonstrates right-sided PICC, tip overlying superior vena cava. The cardiac silhouette is stable, with continued ectasia and atherosclerosis of the thoracic aorta. No acute airspace disease, effusion, or pneumothorax. No acute bony abnormalities. IMPRESSION: 1. Right-sided PICC, tip overlying superior vena cava. 2. No acute intrathoracic process. 3. Stable chronic ectasia of the thoracic aorta, compatible with thoracic aortic aneurysm seen on recent CT chest. Electronically Signed   By: Sharlet Salina M.D.   On: 09/29/2023 19:22   [place] EKG SITE RITE  Result Date: 09/29/2023 If Site Rite image not attached, placement could not be confirmed  due to current cardiac rhythm.            LOS: 0 days      Sunnie Nielsen, DO [facility] 10/02/2023, 11:50 PM    Dictation software may have been used to generate the above note. Typos may occur and escape review in typed/dictated notes. Please contact Dr Lyn Hollingshead directly for clarity if needed.  Staff may message me via secure chat in Epic  but this may not receive an immediate response,  please page me for urgent matters!  If 7PM-7AM, please contact night coverage [email or web address]

## 2023-10-02 NOTE — H&P (Incomplete)
HISTORY AND PHYSICAL    Mathew Mathew   BJY:[id number] DOB: 1936/10/12   Date of Service: 10/02/23 Requesting physician/APP from ED: Treatment Team:  Attending Provider: Lonell Grandchild, MD Physician Assistant: Marita Kansas, PA-C  PCP: [name], PA-C     HPI: Mathew Mathew is a 87 y.o. male with PMH atrial fibrillation on Eliquis and diltiazem, HTN, osteoarthritis and recent treatment for septic prosthetic joint, see below.  Presented today to the ED from SNF rehab with fever.  Of note, recently admitted to hospital 09/08/2023 and discharged 10/01/2023, yesterday.  On that admission, treated for subdural hematoma status post fall, prosthetic joint infection -underwent irrigation/debridement and liner exchange left prosthetic joint on 09/28/2023.  Was discharged yesterday to SNF with PICC line on IV antibiotics.  Consultants:  ***  Procedures: ***      ASSESSMENT & PLAN:   Active Problems:   * No active hospital problems. *      DVT prophylaxis: *** Pertinent IV fluids/nutrition: *** Central lines / invasive devices: ***  Code Status: *** Family Communication: ***  Disposition: *** TOC needs: *** Barriers to discharge / significant pending items: ***                  Review of Systems:  ROS     has a past medical history of Arthritis, DJD (degenerative joint disease), Dysrhythmia, Glaucoma, and Hypertension.  No current facility-administered medications on file prior to encounter.   Current Outpatient Medications on File Prior to Encounter  Medication Sig Dispense Refill  . acetaminophen (TYLENOL) 325 MG tablet Take 2 tablets (650 mg total) by mouth every 6 (six) hours as needed for mild pain (or Fever >/= 101).    Marland Kitchen apixaban (ELIQUIS) 5 MG TABS tablet Take 5 mg by mouth 2 (two) times daily.    . bisacodyl (DULCOLAX) 10 MG suppository Place 1 suppository (10 mg total) rectally daily as needed for moderate constipation.    .  Calcium Citrate-Vitamin D (CALCIUM + D PO) Take 1 tablet by mouth 2 (two) times daily.    Marland Kitchen ceFAZolin (ANCEF) IVPB Inject 2 g into the vein every 8 (eight) hours. Indication:  MSSA L-knee PJI First Dose: Yes Last Day of Therapy:  11/09/23 Labs - Once weekly:  CBC/D and BMP, Labs - Once weekly: ESR and CRP Method of administration: IV Push Method of administration may be changed at the discretion of home infusion pharmacist based upon assessment of the patient and/or caregiver's ability to self-administer the medication ordered. 123 Units 0  . diltiazem (CARDIZEM CD) 120 MG 24 hr capsule Take 1 capsule (120 mg total) by mouth daily.    . diphenhydrAMINE (BENADRYL) 12.5 MG/5ML elixir Take 5-10 mLs (12.5-25 mg total) by mouth every 4 (four) hours as needed for itching. (Patient taking differently: Take 10 mLs by mouth every 4 (four) hours as needed for itching.)    . docusate sodium (COLACE) 100 MG capsule Take 1 capsule (100 mg total) by mouth 2 (two) times daily.    Marland Kitchen EPINEPHrine 0.3 mg/0.3 mL IJ SOAJ injection Inject 0.3 mg into the muscle once as needed (if patient exhibits significant signs and symptoms of allergic reaction.).    Marland Kitchen HYDROcodone-acetaminophen (NORCO/VICODIN) 5-325 MG tablet Take 1-2 tablets by mouth every 4 (four) hours as needed for moderate pain. (Patient taking differently: Take 1 tablet by mouth every 4 (four) hours as needed for moderate pain.) 30 tablet 0  . irbesartan (AVAPRO) 300 MG tablet  Take 1 tablet (300 mg total) by mouth daily.    . magnesium hydroxide (MILK OF MAGNESIA) 400 MG/5ML suspension Take 30 mLs by mouth daily as needed for mild constipation.    . melatonin 3 MG TABS tablet Take 2 tablets (6 mg total) by mouth at bedtime.    . metoprolol tartrate (LOPRESSOR) 25 MG tablet Take 0.5 tablets (12.5 mg total) by mouth 2 (two) times daily.    . Multiple Vitamin (MULTIVITAMIN WITH MINERALS) TABS tablet Take 1 tablet by mouth daily at 2 PM.     . Multiple  Vitamins-Minerals (PRESERVISION AREDS 2 PO) Take 1 tablet by mouth in the morning and at bedtime.    Marland Kitchen Nystatin (GERHARDT'S BUTT CREAM) CREA Apply 1 Application topically 3 (three) times daily.    . ondansetron (ZOFRAN) 4 MG tablet Take 1 tablet (4 mg total) by mouth every 6 (six) hours as needed for nausea.    . polyethylene glycol (MIRALAX / GLYCOLAX) 17 g packet Take 17 g by mouth daily as needed for mild constipation.    . QUEtiapine (SEROQUEL) 25 MG tablet Take 0.5 tablets (12.5 mg total) by mouth at bedtime.    Marland Kitchen UNABLE TO FIND Take 1 application  by mouth in the morning, at noon, and at bedtime. Med Name: magic cup supplement    . Cholecalciferol (DIALYVITE VITAMIN D 5000) 125 MCG (5000 UT) capsule Take 5,000 Units by mouth every evening.  (Patient not taking: Reported on 10/02/2023)    . diltiazem (CARDIZEM LA) 180 MG 24 hr tablet Take 180 mg by mouth daily. (Patient not taking: Reported on 10/02/2023)    . Vitamin D, Ergocalciferol, (DRISDOL) 1.25 MG (50000 UNIT) CAPS capsule Take 50,000 Units by mouth once a week. (Patient not taking: Reported on 10/02/2023)       Allergies  Allergen Reactions  . Penicillins Anaphylaxis    Tolerates ancef and cephalexin  Did it involve swelling of the face/tongue/throat, SOB, or low BP? Yes Did it involve sudden or severe rash/hives, skin peeling, or any reaction on the inside of your mouth or nose? Unknown Did you need to seek medical attention at a hospital or doctor's office? Yes When did it last happen?      50+ years If all above answers are "NO", may proceed with cephalosporin use.       family history includes Hypertension in an other family member.  Past Surgical History:  Procedure Laterality Date  . EYE SURGERY     bil   . I & D KNEE WITH POLY EXCHANGE Left 09/28/2023   Procedure: IRRIGATION AND DEBRIDEMENT KNEE WITH POLY EXCHANGE;  Surgeon: Ollen Gross, MD;  Location: [facility] OR;  Service: Orthopedics;  Laterality: Left;  . JOINT  REPLACEMENT    . knee surgery     arthroscopic  . TOTAL KNEE ARTHROPLASTY Left 09/12/2019   Procedure: TOTAL KNEE ARTHROPLASTY;  Surgeon: Ollen Gross, MD;  Location: [facility] ORS;  Service: Orthopedics;  Laterality: Left;   . TOTAL KNEE ARTHROPLASTY Right 03/12/2020   Procedure: TOTAL KNEE ARTHROPLASTY;  Surgeon: Ollen Gross, MD;  Location: [facility] ORS;  Service: Orthopedics;  Laterality: Right;           Objective Findings:  Vitals:   10/02/23 2030 10/02/23 2045 10/02/23 2100 10/02/23 2314  BP: 123/67 133/68 132/77   Pulse: 82 83 75   Resp: (!) 25 (!) 23 (!) 21   Temp:    98.2 F (36.8 C)  TempSrc:  Oral  SpO2: 97% 100% 100%   Weight:      Height:       No intake or output data in the 24 hours ending 10/02/23 2350 Filed Weights   10/02/23 1905  Weight: 86.2 kg    Examination:  Physical Exam       Scheduled Medications:    Continuous Infusions:   PRN Medications:    Antimicrobials:  Anti-infectives (From admission, onward)    Start     Dose/Rate Route Frequency Ordered Stop   10/02/23 2300  ceFAZolin (ANCEF) IVPB 2g/100 mL premix        2 g 200 mL/hr over 30 Minutes Intravenous  Once 10/02/23 2256 10/02/23 2334           Data Reviewed: I have personally reviewed following labs and imaging studies  CBC: Recent Labs  Lab 09/26/23 0357 09/27/23 0550 09/29/23 0505 09/30/23 0437 10/02/23 1930  WBC 8.1 9.4 15.3* 10.2 7.5  NEUTROABS  --   --   --   --  5.9  HGB 11.7* 11.5* 12.2* 9.6* 9.1*  HCT 35.4* 33.9* 37.6* 28.8* 28.2*  MCV 90.5 91.9 94.5 93.5 93.4  PLT 150 181 248 216 264   Basic Metabolic Panel: Recent Labs  Lab 09/26/23 0357 09/27/23 0550 09/29/23 0505 09/30/23 0437 10/02/23 1930  NA 141 141 139 141 139  K 3.2* 3.8 3.8 3.1* 3.7  CL 105 100 100 103 101  CO2 24 25 25 29 26   GLUCOSE 149* 145* 189* 132* 197*  BUN 19 16 19 21 17   CREATININE 0.96 0.90 1.17 1.09 0.94  CALCIUM 7.8* 7.9* 7.9* 7.6* 7.7*  MG 2.1  --  2.0  2.1  --    GFR: Estimated Creatinine Clearance: 59.3 mL/min (by C-G formula based on SCr of 0.94 mg/dL). Liver Function Tests: Recent Labs  Lab 10/02/23 1930  AST 54*  ALT 14  ALKPHOS 113  BILITOT 1.0  PROT 5.7*  ALBUMIN 1.8*   No results for input(s): "LIPASE", "AMYLASE" in the last 168 hours. No results for input(s): "AMMONIA" in the last 168 hours. Coagulation Profile: Recent Labs  Lab 10/02/23 1930  INR 3.4*   Cardiac Enzymes: No results for input(s): "CKTOTAL", "CKMB", "CKMBINDEX", "TROPONINI" in the last 168 hours. BNP (last 3 results) No results for input(s): "PROBNP" in the last 8760 hours. HbA1C: No results for input(s): "HGBA1C" in the last 72 hours. CBG: Recent Labs  Lab 09/30/23 1045 09/30/23 1639 09/30/23 2128 10/01/23 0609 10/01/23 1118  GLUCAP 259* 198* 139* 141* 217*   Lipid Profile: No results for input(s): "CHOL", "HDL", "LDLCALC", "TRIG", "CHOLHDL", "LDLDIRECT" in the last 72 hours. Thyroid Function Tests: No results for input(s): "TSH", "T4TOTAL", "FREET4", "T3FREE", "THYROIDAB" in the last 72 hours. Anemia Panel: No results for input(s): "VITAMINB12", "FOLATE", "FERRITIN", "TIBC", "IRON", "RETICCTPCT" in the last 72 hours. Most Recent Urinalysis On File:     Component Value Date/Time   COLORURINE YELLOW 10/02/2023 2227   APPEARANCEUR HAZY (A) 10/02/2023 2227   LABSPEC 1.018 10/02/2023 2227   PHURINE 5.0 10/02/2023 2227   GLUCOSEU 50 (A) 10/02/2023 2227   HGBUR LARGE (A) 10/02/2023 2227   BILIRUBINUR NEGATIVE 10/02/2023 2227   KETONESUR NEGATIVE 10/02/2023 2227   PROTEINUR 30 (A) 10/02/2023 2227   NITRITE NEGATIVE 10/02/2023 2227   LEUKOCYTESUR TRACE (A) 10/02/2023 2227   Sepsis Labs: @LABRCNTIP (procalcitonin:4,lacticidven:4)  Recent Results (from the past 240 hour(s))  Body fluid culture w Gram Stain     Status: None  Collection Time: 09/23/23  2:20 PM   Specimen: Synovium; Body Fluid  Result Value Ref Range Status   Specimen  Description SYNOVIAL LEFT KNEE  Final   Special Requests NONE  Final   Gram Stain   Final    ABUNDANT WBC PRESENT, PREDOMINANTLY PMN FEW GRAM POSITIVE COCCI IN PAIRS CRITICAL RESULT CALLED TO, READ BACK BY AND VERIFIED WITH: RN ALEXIS MORGAN ON 09/23/23 @ 1909 BY DRT Performed at [facility], [address] [place], [place] [address]    Culture MODERATE STAPHYLOCOCCUS AUREUS  Final   Report Status 09/25/2023 FINAL  Final   Organism ID, Bacteria STAPHYLOCOCCUS AUREUS  Final      Susceptibility   Staphylococcus aureus - MIC*    CIPROFLOXACIN <=0.5 SENSITIVE Sensitive     ERYTHROMYCIN <=0.25 SENSITIVE Sensitive     GENTAMICIN <=0.5 SENSITIVE Sensitive     OXACILLIN 0.5 SENSITIVE Sensitive     TETRACYCLINE <=1 SENSITIVE Sensitive     VANCOMYCIN <=0.5 SENSITIVE Sensitive     TRIMETH/SULFA <=10 SENSITIVE Sensitive     CLINDAMYCIN <=0.25 SENSITIVE Sensitive     RIFAMPIN <=0.5 SENSITIVE Sensitive     Inducible Clindamycin NEGATIVE Sensitive     LINEZOLID 2 SENSITIVE Sensitive     * MODERATE STAPHYLOCOCCUS AUREUS  Aerobic/Anaerobic Culture w Gram Stain (surgical/deep wound)     Status: None (Preliminary result)   Collection Time: 09/28/23  5:00 PM   Specimen: Path fluid; Body Fluid  Result Value Ref Range Status   Specimen Description FLUID  Final   Special Requests left knee infection  Final   Gram Stain   Final    RARE WBC PRESENT, PREDOMINANTLY PMN NO ORGANISMS SEEN Performed at [facility], [address], [place], [place] [address]    Culture   Final    RARE STAPHYLOCOCCUS AUREUS SUSCEPTIBILITIES TO FOLLOW NO ANAEROBES ISOLATED; CULTURE IN PROGRESS FOR 5 DAYS    Report Status PENDING  Incomplete  Resp panel by RT-PCR (RSV, Flu A&B, Covid) Anterior Nasal Swab     Status: None   Collection Time: 10/02/23  7:25 PM   Specimen: Anterior Nasal Swab  Result Value Ref Range Status   SARS Coronavirus 2 by RT PCR NEGATIVE NEGATIVE Final   Influenza A by PCR NEGATIVE  NEGATIVE Final   Influenza B by PCR NEGATIVE NEGATIVE Final    Comment: (NOTE) The Xpert Xpress SARS-CoV-2/FLU/RSV plus assay is intended as an aid in the diagnosis of influenza from Nasopharyngeal swab specimens and should not be used as a sole basis for treatment. Nasal washings and aspirates are unacceptable for Xpert Xpress SARS-CoV-2/FLU/RSV testing.  Fact Sheet for Patients: BloggerCourse.com  Fact Sheet for Healthcare Providers: SeriousBroker.it  This test is not yet approved or cleared by the [place] FDA and has been authorized for detection and/or diagnosis of SARS-CoV-2 by FDA under an Emergency Use Authorization (EUA). This EUA will remain in effect (meaning this test can be used) for the duration of the COVID-19 declaration under Section 564(b)(1) of the Act, 21 U.S.C. section 360bbb-3(b)(1), unless the authorization is terminated or revoked.     Resp Syncytial Virus by PCR NEGATIVE NEGATIVE Final    Comment: (NOTE) Fact Sheet for Patients: BloggerCourse.com  Fact Sheet for Healthcare Providers: SeriousBroker.it  This test is not yet approved or cleared by the [place] FDA and has been authorized for detection and/or diagnosis of SARS-CoV-2 by FDA under an Emergency Use Authorization (EUA). This EUA will remain in  effect (meaning this test can be used) for the duration of the COVID-19 declaration under Section 564(b)(1) of the Act, 21 U.S.C. section 360bbb-3(b)(1), unless the authorization is terminated or revoked.  Performed at [facility], [address], [place], [place] [address]          Radiology Studies: DG Chest Port 1 View  Result Date: 10/02/2023 CLINICAL DATA:  Questionable sepsis EXAM: PORTABLE CHEST 1 VIEW COMPARISON:  09/29/2023 FINDINGS: Heart borderline in size. Mediastinal contours within normal limits. Aortic  atherosclerosis. Lungs clear. No effusions. Right PICC line in place with the tip in the SVC. IMPRESSION: No active disease. Electronically Signed   By: Charlett Nose M.D.   On: 10/02/2023 22:09   DG Chest Port 1 View  Result Date: 09/29/2023 CLINICAL DATA:  PICC line placement EXAM: PORTABLE CHEST 1 VIEW COMPARISON:  09/20/2023 FINDINGS: Single frontal view of the chest demonstrates right-sided PICC, tip overlying superior vena cava. The cardiac silhouette is stable, with continued ectasia and atherosclerosis of the thoracic aorta. No acute airspace disease, effusion, or pneumothorax. No acute bony abnormalities. IMPRESSION: 1. Right-sided PICC, tip overlying superior vena cava. 2. No acute intrathoracic process. 3. Stable chronic ectasia of the thoracic aorta, compatible with thoracic aortic aneurysm seen on recent CT chest. Electronically Signed   By: Sharlet Salina M.D.   On: 09/29/2023 19:22   [place] EKG SITE RITE  Result Date: 09/29/2023 If Site Rite image not attached, placement could not be confirmed due to current cardiac rhythm.            LOS: 0 days    Time spent: ***    Sunnie Nielsen, DO [facility] 10/02/2023, 11:50 PM    Dictation software may have been used to generate the above note. Typos may occur and escape review in typed/dictated notes. Please contact Dr Lyn Hollingshead directly for clarity if needed.  Staff may message me via secure chat in Epic  but this may not receive an immediate response,  please page me for urgent matters!  If 7PM-7AM, please contact night coverage [email or web address]

## 2023-10-02 NOTE — ED Triage Notes (Addendum)
Pt arrives via EMS from [facility]. Pt had surgery on left knee 2 days ago (irrigation and debridement). Pt developed a fever today of 101.4. Pt does have a Picc line and has been receiving abx. EMS administered 650mg  of tylenol and bolus of NS.

## 2023-10-02 NOTE — ED Provider Notes (Signed)
[place] EMERGENCY DEPARTMENT AT [facility] Provider Note   CSN: [id number] Arrival date & time: 10/02/23  1900     History  Chief Complaint  Patient presents with   Fever   Post-op Problem    Mathew Simpson is a 87 y.o. male.  87 year old male presents today for concern of fever.  He is [facility] in from SNF.  His temperature there was 1-1.4.  He was given Tylenol and sent to the emergency room for further evaluation.  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  He has a PICC line and is receiving cefazolin IV  until 11/18.  Reports significant pain in the left knee.  Denies cough, or dysuria.  The history is provided by the patient. No language interpreter was used.       Home Medications Prior to Admission medications   Medication Sig Start Date End Date Taking? Authorizing Provider  acetaminophen (TYLENOL) 325 MG tablet Take 2 tablets (650 mg total) by mouth every 6 (six) hours as needed for mild pain (or Fever >/= 101). 10/01/23  Yes Arnetha Courser, MD  apixaban (ELIQUIS) 5 MG TABS tablet Take 5 mg by mouth 2 (two) times daily.   Yes [provider]  bisacodyl (DULCOLAX) 10 MG suppository Place 1 suppository (10 mg total) rectally daily as needed for moderate constipation. 10/01/23  Yes Arnetha Courser, MD  Calcium Citrate-Vitamin D (CALCIUM + D PO) Take 1 tablet by mouth 2 (two) times daily.   Yes [provider]  ceFAZolin (ANCEF) IVPB Inject 2 g into the vein every 8 (eight) hours. Indication:  MSSA L-knee PJI First Dose: Yes Last Day of Therapy:  11/09/23 Labs - Once weekly:  CBC/D and BMP, Labs - Once weekly: ESR and CRP Method of administration: IV Push Method of administration may be changed at the discretion of home infusion pharmacist based upon assessment of the patient and/or caregiver's ability to self-administer the medication ordered. 10/01/23 11/11/23 Yes Arnetha Courser, MD  diltiazem (CARDIZEM CD) 120 MG 24 hr capsule Take  1 capsule (120 mg total) by mouth daily. 10/02/23  Yes Arnetha Courser, MD  diphenhydrAMINE (BENADRYL) 12.5 MG/5ML elixir Take 5-10 mLs (12.5-25 mg total) by mouth every 4 (four) hours as needed for itching. Patient taking differently: Take 10 mLs by mouth every 4 (four) hours as needed for itching. 10/01/23  Yes Arnetha Courser, MD  docusate sodium (COLACE) 100 MG capsule Take 1 capsule (100 mg total) by mouth 2 (two) times daily. 10/01/23  Yes Arnetha Courser, MD  EPINEPHrine 0.3 mg/0.3 mL IJ SOAJ injection Inject 0.3 mg into the muscle once as needed (if patient exhibits significant signs and symptoms of allergic reaction.). 10/01/23  Yes Arnetha Courser, MD  HYDROcodone-acetaminophen (NORCO/VICODIN) 5-325 MG tablet Take 1-2 tablets by mouth every 4 (four) hours as needed for moderate pain. Patient taking differently: Take 1 tablet by mouth every 4 (four) hours as needed for moderate pain. 10/01/23  Yes Arnetha Courser, MD  irbesartan (AVAPRO) 300 MG tablet Take 1 tablet (300 mg total) by mouth daily. 10/02/23  Yes Arnetha Courser, MD  magnesium hydroxide (MILK OF MAGNESIA) 400 MG/5ML suspension Take 30 mLs by mouth daily as needed for mild constipation.   Yes [provider]  melatonin 3 MG TABS tablet Take 2 tablets (6 mg total) by mouth at bedtime. 10/01/23  Yes Arnetha Courser, MD  metoprolol tartrate (LOPRESSOR) 25 MG tablet Take 0.5 tablets (12.5 mg total) by mouth 2 (two) times daily. 10/01/23  Yes Arnetha Courser, MD  Multiple Vitamin (MULTIVITAMIN WITH MINERALS) TABS tablet Take 1 tablet by mouth daily at 2 PM.    Yes [provider]  Multiple Vitamins-Minerals (PRESERVISION AREDS 2 PO) Take 1 tablet by mouth in the morning and at bedtime.   Yes [provider]  Nystatin (GERHARDT'S BUTT CREAM) CREA Apply 1 Application topically 3 (three) times daily. 10/01/23  Yes Arnetha Courser, MD  ondansetron (ZOFRAN) 4 MG tablet Take 1 tablet (4 mg total) by mouth every 6 (six) hours as  needed for nausea. 10/01/23  Yes Arnetha Courser, MD  polyethylene glycol (MIRALAX / GLYCOLAX) 17 g packet Take 17 g by mouth daily as needed for mild constipation. 10/01/23  Yes Arnetha Courser, MD  QUEtiapine (SEROQUEL) 25 MG tablet Take 0.5 tablets (12.5 mg total) by mouth at bedtime. 10/01/23  Yes Arnetha Courser, MD  UNABLE TO FIND Take 1 application  by mouth in the morning, at noon, and at bedtime. Med Name: magic cup supplement   Yes [provider]  Cholecalciferol (DIALYVITE VITAMIN D 5000) 125 MCG (5000 UT) capsule Take 5,000 Units by mouth every evening.  Patient not taking: Reported on 10/02/2023    [provider]  diltiazem (CARDIZEM LA) 180 MG 24 hr tablet Take 180 mg by mouth daily. Patient not taking: Reported on 10/02/2023 09/16/23   [provider]  Vitamin D, Ergocalciferol, (DRISDOL) 1.25 MG (50000 UNIT) CAPS capsule Take 50,000 Units by mouth once a week. Patient not taking: Reported on 10/02/2023 09/23/23   [provider]      Allergies    Penicillins    Review of Systems   Review of Systems  Constitutional:  Positive for fever.  Respiratory:  Negative for cough and shortness of breath.   Genitourinary:  Negative for dysuria.  Skin:  Positive for wound.  All other systems reviewed and are negative.   Physical Exam Updated Vital Signs BP 132/77   Pulse 75   Temp 98.7 F (37.1 C)   Resp (!) 21   Ht 5\' 10"  (1.778 m)   Wt 86.2 kg   SpO2 100%   BMI 27.26 kg/m  Physical Exam Vitals and nursing note reviewed.  Constitutional:      General: He is not in acute distress.    Appearance: Normal appearance. He is not ill-appearing.  HENT:     Head: Normocephalic and atraumatic.     Nose: Nose normal.  Eyes:     Conjunctiva/sclera: Conjunctivae normal.  Cardiovascular:     Rate and Rhythm: Normal rate and regular rhythm.  Pulmonary:     Effort: Pulmonary effort is normal. No respiratory distress.  Abdominal:     Palpations:  Abdomen is soft.  Musculoskeletal:        General: No deformity. Normal range of motion.     Cervical back: Normal range of motion.  Skin:    Findings: No rash.  Neurological:     Mental Status: He is alert.     ED Results / Procedures / Treatments   Labs (all labs ordered are listed, but only abnormal results are displayed) Labs Reviewed  COMPREHENSIVE METABOLIC PANEL - Abnormal; Notable for the following components:      Result Value   Glucose, Bld 197 (*)    Calcium 7.7 (*)    Total Protein 5.7 (*)    Albumin 1.8 (*)    AST 54 (*)    All other components within normal limits  CBC WITH  DIFFERENTIAL/PLATELET - Abnormal; Notable for the following components:   RBC 3.02 (*)    Hemoglobin 9.1 (*)    HCT 28.2 (*)    All other components within normal limits  PROTIME-INR - Abnormal; Notable for the following components:   Prothrombin Time 34.3 (*)    INR 3.4 (*)    All other components within normal limits  APTT - Abnormal; Notable for the following components:   aPTT 55 (*)    All other components within normal limits  URINALYSIS, W/ REFLEX TO CULTURE (INFECTION SUSPECTED) - Abnormal; Notable for the following components:   APPearance HAZY (*)    Glucose, UA 50 (*)    Hgb urine dipstick LARGE (*)    Protein, ur 30 (*)    Leukocytes,Ua TRACE (*)    All other components within normal limits  I-STAT CG4 LACTIC ACID, ED - Abnormal; Notable for the following components:   Lactic Acid, Venous 2.1 (*)    All other components within normal limits  RESP PANEL BY RT-PCR (RSV, FLU A&B, COVID)  RVPGX2  CULTURE, BLOOD (ROUTINE X 2)  CULTURE, BLOOD (ROUTINE X 2)  URINE CULTURE  I-STAT CG4 LACTIC ACID, ED    EKG EKG Interpretation Date/Time:  Friday October 02 2023 19:34:07 EDT Ventricular Rate:  89 PR Interval:    QRS Duration:  100 QT Interval:  383 QTC Calculation: 466 R Axis:   6  Text Interpretation: Atrial fibrillation Abnormal R-wave progression, early transition  Confirmed by Alvino Blood ([id number]) on 10/02/2023 10:33:11 PM  Radiology DG Chest Port 1 View  Result Date: 10/02/2023 CLINICAL DATA:  Questionable sepsis EXAM: PORTABLE CHEST 1 VIEW COMPARISON:  09/29/2023 FINDINGS: Heart borderline in size. Mediastinal contours within normal limits. Aortic atherosclerosis. Lungs clear. No effusions. Right PICC line in place with the tip in the SVC. IMPRESSION: No active disease. Electronically Signed   By: Charlett Nose M.D.   On: 10/02/2023 22:09    Procedures .Critical Care  Performed by: Marita Kansas, PA-C Authorized by: Marita Kansas, PA-C   Critical care provider statement:    Critical care time (minutes):  31   Critical care was necessary to treat or prevent imminent or life-threatening deterioration of the following conditions:  Sepsis   Critical care was time spent personally by me on the following activities:  Development of treatment plan with patient or surrogate, discussions with consultants, evaluation of patient's response to treatment, examination of patient, ordering and review of laboratory studies, ordering and review of radiographic studies, ordering and performing treatments and interventions, pulse oximetry, re-evaluation of patient's condition and review of old charts     Medications Ordered in ED Medications  lactated ringers bolus 1,000 mL (0 mLs Intravenous Stopped 10/02/23 2117)    ED Course/ Medical Decision Making/ A&P                                 Medical Decision Making Amount and/or Complexity of Data Reviewed Labs: ordered. Radiology: ordered. ECG/medicine tests: ordered.  Risk Prescription drug management. Decision regarding hospitalization.    Medical Decision Making / ED Course   This patient presents to the ED for concern of fever, this involves an extensive number of treatment options, and is a complaint that carries with it a high risk of complications and morbidity.  The differential diagnosis  includes recurrent joint infection, pneumonia, viral upper respiratory infection, UTI, intra-abdominal infection  MDM: 87 year old male  presents today for concern of fever.  He returns after being discharged yesterday.  He had a fever of 101.4 at the SNF.  Has a PICC line and receives outpatient antibiotics for his prosthetic joint infection which she was recently admitted for.  On exam his incision site is without dehiscence or purulent drainage.  Will obtain broad workup including blood cultures, lactic acid.   Initial workup without leukocytosis.  Hemoglobin of 9.1 which is around his baseline.  CMP with preserved renal function, normal electrolytes.  Glucose 197.  No other acute findings.  UA without evidence of UTI.  Initially lactic acid of 2.1 improved to 1.8 after fluid bolus.  Chest x-ray without evidence of pneumonia or other acute process.  EKG without acute ischemic changes.  Discussed with orthopedic surgeon who recommends medicine admission and continuing cefazolin and they will follow patient.  Discussed with hospitalist.  They will evaluate patient for admission.   Additional history obtained: -Additional history obtained from recent admission -External records from outside source obtained and reviewed including: Chart review including previous notes, labs, imaging, consultation notes   Lab Tests: -I ordered, reviewed, and interpreted labs.   The pertinent results include:   Labs Reviewed  COMPREHENSIVE METABOLIC PANEL - Abnormal; Notable for the following components:      Result Value   Glucose, Bld 197 (*)    Calcium 7.7 (*)    Total Protein 5.7 (*)    Albumin 1.8 (*)    AST 54 (*)    All other components within normal limits  CBC WITH DIFFERENTIAL/PLATELET - Abnormal; Notable for the following components:   RBC 3.02 (*)    Hemoglobin 9.1 (*)    HCT 28.2 (*)    All other components within normal limits  PROTIME-INR - Abnormal; Notable for the following components:    Prothrombin Time 34.3 (*)    INR 3.4 (*)    All other components within normal limits  APTT - Abnormal; Notable for the following components:   aPTT 55 (*)    All other components within normal limits  URINALYSIS, W/ REFLEX TO CULTURE (INFECTION SUSPECTED) - Abnormal; Notable for the following components:   APPearance HAZY (*)    Glucose, UA 50 (*)    Hgb urine dipstick LARGE (*)    Protein, ur 30 (*)    Leukocytes,Ua TRACE (*)    All other components within normal limits  I-STAT CG4 LACTIC ACID, ED - Abnormal; Notable for the following components:   Lactic Acid, Venous 2.1 (*)    All other components within normal limits  RESP PANEL BY RT-PCR (RSV, FLU A&B, COVID)  RVPGX2  CULTURE, BLOOD (ROUTINE X 2)  CULTURE, BLOOD (ROUTINE X 2)  URINE CULTURE  I-STAT CG4 LACTIC ACID, ED      EKG  EKG Interpretation Date/Time:  Friday October 02 2023 19:34:07 EDT Ventricular Rate:  89 PR Interval:    QRS Duration:  100 QT Interval:  383 QTC Calculation: 466 R Axis:   6  Text Interpretation: Atrial fibrillation Abnormal R-wave progression, early transition Confirmed by Alvino Blood ([id number]) on 10/02/2023 10:33:11 PM         Imaging Studies ordered: I ordered imaging studies including CXR I independently visualized and interpreted imaging. I agree with the radiologist interpretation   Medicines ordered and prescription drug management: Meds ordered this encounter  Medications   lactated ringers bolus 1,000 mL    -I have reviewed the patients home medicines and have made adjustments  as needed  Critical interventions Fluid bolus, IV antibiotics  Consultations Obtained: I requested consultation with the Ortho.  Surgery,  and discussed lab and imaging findings as well as pertinent plan - they recommend: As above   Cardiac Monitoring: The patient was maintained on a cardiac monitor.  I personally viewed and interpreted the cardiac monitored which showed an underlying  rhythm of: Sinus rhythm  Reevaluation: After the interventions noted above, I reevaluated the patient and found that they have :stayed the same  Co morbidities that complicate the patient evaluation  Past Medical History:  Diagnosis Date   Arthritis    back,   DJD (degenerative joint disease)    Dysrhythmia    A-fib   Glaucoma    Hypertension       Dispostion: Discussed with hospitalist will evaluate patient for admission.  Final Clinical Impression(s) / ED Diagnoses Final diagnoses:  Fever, unspecified fever cause    Rx / DC Orders ED Discharge Orders     None         Marita Kansas, PA-C 10/02/23 2325    Lonell Grandchild, MD 10/03/23 1251

## 2023-10-03 ENCOUNTER — Observation Stay (HOSPITAL_COMMUNITY): Payer: Medicare HMO

## 2023-10-03 DIAGNOSIS — T8459XD Infection and inflammatory reaction due to other internal joint prosthesis, subsequent encounter: Secondary | ICD-10-CM

## 2023-10-03 DIAGNOSIS — B9561 Methicillin susceptible Staphylococcus aureus infection as the cause of diseases classified elsewhere: Secondary | ICD-10-CM

## 2023-10-03 DIAGNOSIS — R7881 Bacteremia: Secondary | ICD-10-CM

## 2023-10-03 DIAGNOSIS — Z96659 Presence of unspecified artificial knee joint: Secondary | ICD-10-CM

## 2023-10-03 DIAGNOSIS — M009 Pyogenic arthritis, unspecified: Secondary | ICD-10-CM

## 2023-10-03 DIAGNOSIS — M00062 Staphylococcal arthritis, left knee: Secondary | ICD-10-CM

## 2023-10-03 LAB — CBC
HCT: 24.8 % — ABNORMAL LOW (ref 39.0–52.0)
HCT: 28.5 % — ABNORMAL LOW (ref 39.0–52.0)
Hemoglobin: 7.9 g/dL — ABNORMAL LOW (ref 13.0–17.0)
Hemoglobin: 9.2 g/dL — ABNORMAL LOW (ref 13.0–17.0)
MCH: 30.7 pg (ref 26.0–34.0)
MCH: 30.6 pg (ref 26.0–34.0)
MCHC: 31.9 g/dL (ref 30.0–36.0)
MCHC: 32.3 g/dL (ref 30.0–36.0)
MCV: 96.5 fL (ref 80.0–100.0)
MCV: 94.7 fL (ref 80.0–100.0)
Platelets: 216 10*3/uL (ref 150–400)
Platelets: 255 10*3/uL (ref 150–400)
RBC: 2.57 MIL/uL — ABNORMAL LOW (ref 4.22–5.81)
RBC: 3.01 MIL/uL — ABNORMAL LOW (ref 4.22–5.81)
RDW: 14.1 % (ref 11.5–15.5)
RDW: 14.1 % (ref 11.5–15.5)
WBC: 6.1 10*3/uL (ref 4.0–10.5)
WBC: 6.9 10*3/uL (ref 4.0–10.5)
nRBC: 0 % (ref 0.0–0.2)
nRBC: 0 % (ref 0.0–0.2)

## 2023-10-03 LAB — AEROBIC/ANAEROBIC CULTURE W GRAM STAIN (SURGICAL/DEEP WOUND)

## 2023-10-03 LAB — BASIC METABOLIC PANEL
Anion gap: 12 (ref 5–15)
BUN: 15 mg/dL (ref 8–23)
CO2: 26 mmol/L (ref 22–32)
Calcium: 7.8 mg/dL — ABNORMAL LOW (ref 8.9–10.3)
Chloride: 102 mmol/L (ref 98–111)
Creatinine, Ser: 1.11 mg/dL (ref 0.61–1.24)
GFR, Estimated: >60 mL/min (ref >60)
Glucose, Bld: 145 mg/dL — ABNORMAL HIGH (ref 70–99)
Potassium: 3.8 mmol/L (ref 3.5–5.1)
Sodium: 140 mmol/L (ref 135–145)

## 2023-10-03 LAB — CREATININE, SERUM
Creatinine, Ser: 1.31 mg/dL — ABNORMAL HIGH (ref 0.61–1.24)
GFR, Estimated: 53 mL/min — ABNORMAL LOW (ref >60)

## 2023-10-03 LAB — D-DIMER, QUANTITATIVE: D-Dimer, Quant: 2.08 ug{FEU}/mL — ABNORMAL HIGH (ref 0.00–0.50)

## 2023-10-03 LAB — PROCALCITONIN: Procalcitonin: 0.2 ng/mL

## 2023-10-03 MED ORDER — MAGNESIUM HYDROXIDE 400 MG/5ML PO SUSP: 30.0000 mL | Freq: Every day | ORAL | Status: DC | PRN

## 2023-10-03 MED ORDER — ADULT MULTIVITAMIN W/MINERALS CH
1.0000 | ORAL_TABLET | Freq: Every day | ORAL | Status: DC
  Administered 2023-10-03 – 2023-10-08 (×6): 1 via ORAL
  Filled 2023-10-03 (×7): qty 1

## 2023-10-03 MED ORDER — ACETAMINOPHEN 325 MG PO TABS
650.0000 mg | ORAL_TABLET | Freq: Four times a day (QID) | ORAL | Status: DC | PRN
  Administered 2023-10-05 – 2023-10-08 (×6): 650 mg via ORAL
  Filled 2023-10-03 (×6): qty 2

## 2023-10-03 MED ORDER — BISACODYL 10 MG RE SUPP: 10.0000 mg | Freq: Every day | RECTAL | Status: DC | PRN

## 2023-10-03 MED ORDER — GERHARDT'S BUTT CREAM
1.0000 | TOPICAL_CREAM | Freq: Three times a day (TID) | CUTANEOUS | Status: DC
  Administered 2023-10-03 – 2023-10-06 (×12): 1 via TOPICAL
  Filled 2023-10-03: qty 1

## 2023-10-03 MED ORDER — DILTIAZEM HCL ER COATED BEADS 120 MG PO CP24
120.0000 mg | ORAL_CAPSULE | Freq: Every day | ORAL | Status: DC
  Administered 2023-10-03 – 2023-10-08 (×6): 120 mg via ORAL
  Filled 2023-10-03 (×6): qty 1

## 2023-10-03 MED ORDER — APIXABAN 5 MG PO TABS
5.0000 mg | ORAL_TABLET | Freq: Two times a day (BID) | ORAL | Status: DC
  Administered 2023-10-03 – 2023-10-08 (×11): 5 mg via ORAL
  Filled 2023-10-03 (×11): qty 1

## 2023-10-03 MED ORDER — CHLORHEXIDINE GLUCONATE CLOTH 2 % EX PADS
6.0000 | MEDICATED_PAD | Freq: Every day | CUTANEOUS | Status: DC
  Administered 2023-10-03 – 2023-10-08 (×6): 6 via TOPICAL

## 2023-10-03 MED ORDER — DOCUSATE SODIUM 100 MG PO CAPS
100.0000 mg | ORAL_CAPSULE | Freq: Two times a day (BID) | ORAL | Status: DC
  Administered 2023-10-03 – 2023-10-08 (×10): 100 mg via ORAL
  Filled 2023-10-03 (×11): qty 1

## 2023-10-03 MED ORDER — POLYETHYLENE GLYCOL 3350 17 G PO PACK: 17.0000 g | PACK | Freq: Every day | ORAL | Status: DC | PRN

## 2023-10-03 MED ORDER — METOPROLOL TARTRATE 12.5 MG HALF TABLET
12.5000 mg | ORAL_TABLET | Freq: Two times a day (BID) | ORAL | Status: DC
  Administered 2023-10-03 – 2023-10-08 (×11): 12.5 mg via ORAL
  Filled 2023-10-03 (×11): qty 1

## 2023-10-03 MED ORDER — CEFAZOLIN SODIUM-DEXTROSE 1-4 GM/50ML-% IV SOLN
1.0000 g | Freq: Three times a day (TID) | INTRAVENOUS | Status: DC
  Administered 2023-10-03 – 2023-10-05 (×8): 1 g via INTRAVENOUS
  Filled 2023-10-03 (×8): qty 50

## 2023-10-03 MED ORDER — OXYCODONE HCL 5 MG PO TABS
5.0000 mg | ORAL_TABLET | ORAL | Status: DC | PRN
  Administered 2023-10-03 – 2023-10-08 (×14): 5 mg via ORAL
  Filled 2023-10-03 (×14): qty 1

## 2023-10-03 MED ORDER — ONDANSETRON HCL 4 MG PO TABS
4.0000 mg | ORAL_TABLET | Freq: Four times a day (QID) | ORAL | Status: DC | PRN
  Administered 2023-10-08: 4 mg via ORAL
  Filled 2023-10-03: qty 1

## 2023-10-03 NOTE — Care Management Obs Status (Signed)
MEDICARE OBSERVATION STATUS NOTIFICATION   Patient Details  Name: Mathew Simpson MRN: [id number] Date of Birth: April 20, 1936   Medicare Observation Status Notification Given:  Yes    Ronny Bacon, RN 10/03/2023, 3:15 PM

## 2023-10-03 NOTE — Progress Notes (Signed)
PROGRESS NOTE    Mathew Simpson  OAC:[id number] DOB: 07/15/1936 DOA: 10/02/2023 PCP: [name], PA-C    Brief Narrative:   Mathew Simpson is a 87 y.o. 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 Lequita Halt on 10/7 who recently discharged to [facility] on 10/10 on IV antibiotics who presented to [facility] ED on 10/12 with report of fever.  Facility reports fever of 101.4.  Patient reports significant pain to the left knee, denies cough, no dysuria.  Has PICC line in place and has been receiving IV antibiotics from recent bacteremia/knee joint infection.  EMS administered Tylenol and 500 mL bolus and transported to the ED for further evaluation.  In the ED, temperature 98.7 F, HR 93, RR 22, BP 120/73, SpO2 98% on room air.  WBC 7.5, hemoglobin 9.1, platelet count 264.  Sodium 139, potassium 3.7, chloride 101, CO2 26, glucose 197, BUN 17, creatinine 0.94.  AST 54, ALT 14, total bilirubin 1.0.  Lactic acid 2.1>1.8.  COVID/flu/RSV PCR negative.  Urinalysis with trace leukocytes, negative nitrite, no bacteria, 11-20 WBCs.  Chest x-ray with no active cardiopulmonary disease process.  Blood cultures x 2 and urine culture obtained.  Patient was continued on IV Novox with cefazolin as previously prescribed.  Orthopedics was consulted who recommended medicine admission; and will follow patient while inpatient.  TRH consulted for admission.  Assessment & Plan:   Fever Recent MSSA bacteremia 2/2 left knee periprosthetic joint infection Patient presenting to the ED from SNF with fever reportedly 101.4 F.  Recent MSSA bacteremia secondary to left knee periprosthetic joint infection in which patient underwent I&D with poly exchange on 09/28/2023.  Was seen by  infectious disease during previous hospitalization and discharged on cefazolin via PICC line with projected end date 11/09/2023.  Chest x-ray unrevealing, urinalysis negative.  Patient does have significant erythema, swelling to left knee. -- Orthopedics following, appreciate assistance; believes this is typical postop changes and no further surgical invention required at this time -- D-dimer: Pending; if positive will obtain left lower extremity vascular duplex ultrasound and right upper extremity vascular ultrasound given PICC line -- Procalcitonin 0.20, reassuring -- Blood cultures x 2: Pending -- Continue antibiotics with IV cefazolin 1 g IV every 8 hours -- Continue monitor fever curve, supportive care, antipyretics -- CBC daily  Persistent atrial fibrillation on anticoagulation Essential hypertension -- Metoprolol tartrate 12.5 mg p.o. twice daily -- Eliquis 5 mg p.o. twice daily -- Monitor on telemetry  Type 2 diabetes mellitus Hemoglobin A1c 6.7 on 09/08/2023, well-controlled.  Diet controlled at baseline. -- CBG qAM  History of recent TBI/SDH for mechanical fall Presenting from SNF -- PT/OT eval --Fall precautions   DVT prophylaxis:  apixaban (ELIQUIS) tablet 5 mg    Code Status: Limited: Do not attempt resuscitation (DNR) -DNR-LIMITED -Do Not Intubate/DNI  Family Communication: No family present at bedside this morning  Disposition Plan:  Level of care: Telemetry Medical Status is: Observation The patient remains OBS appropriate and will d/c before 2 midnights.    Consultants:  Orthopedics  Procedures:  None  Antimicrobials:  Cefazolin   Subjective: Patient seen examined bedside, resting calmly.  Lying in bed.  Complaining of significant pain to left knee.  Difficulty with passive or active range of motion.  Seen by orthopedics this  morning, believes this is all normal "postoperative changes", no indication for surgical invention at this time.  Remains afebrile  since arrival to the ED.  Continues on IV cefazolin as prescribed by infectious disease on discharge recently.  Blood cultures pending.  No other specific complaints or concerns at this time.  Denies headache, no visual changes, no chest pain, no palpitations, no shortness of breath, no abdominal pain, no current fever, no chills/night sweats, no nausea/vomiting/diarrhea, no focal weakness, no fatigue, no cough/congestion, no paresthesias.  No acute events overnight per nursing staff.  Objective: Vitals:   10/02/23 2314 10/03/23 0045 10/03/23 0141 10/03/23 0620  BP:  (!) 141/69 (!) 154/84 (!) 168/78  Pulse:  70 92 90  Resp:  (!) 24 16 15   Temp: 98.2 F (36.8 C)  98.3 F (36.8 C) 98.9 F (37.2 C)  TempSrc: Oral  Oral   SpO2:  95% 99% 100%  Weight:      Height:        Intake/Output Summary (Last 24 hours) at 10/03/2023 1121 Last data filed at 10/03/2023 0537 Gross per 24 hour  Intake 40.03 ml  Output --  Net 40.03 ml   Filed Weights   10/02/23 1905  Weight: 86.2 kg    Examination:  Physical Exam: GEN: NAD, alert and oriented x 3, elderly/chronically ill in appearance HEENT: NCAT, PERRL, EOMI, sclera clear, MMM, poor dentition, noted posterior scalp scar PULM: CTAB w/o wheezes/crackles, normal respiratory effort, on room air CV: RRR w/o M/G/R GI: abd soft, NTND, NABS, no R/G/M MSK: 2-3 + LLE peripheral edema up to thigh, noted edematous left knee with erythema, TTP, + pain w/ AROM/PROM NEURO: No focal neurological deficits PSYCH: normal mood/affect Integumentary: Left lower extremity with edema, erythema, TTP warm to touch, otherwise no other concerning rashes/lesions/wounds nonexposed skin surfaces       Data Reviewed: I have personally reviewed following labs and imaging studies  CBC: Recent Labs  Lab 09/29/23 0505 09/30/23 0437 10/02/23 1930 10/03/23 0030 10/03/23 0426  WBC 15.3* 10.2 7.5 6.1 6.9  NEUTROABS  --   --  5.9  --   --   HGB 12.2* 9.6* 9.1* 7.9*  9.2*  HCT 37.6* 28.8* 28.2* 24.8* 28.5*  MCV 94.5 93.5 93.4 96.5 94.7  PLT 248 216 264 216 255   Basic Metabolic Panel: Recent Labs  Lab 09/27/23 0550 09/29/23 0505 09/30/23 0437 10/02/23 1930 10/03/23 0030 10/03/23 0426  NA 141 139 141 139  --  140  K 3.8 3.8 3.1* 3.7  --  3.8  CL 100 100 103 101  --  102  CO2 25 25 29 26   --  26  GLUCOSE 145* 189* 132* 197*  --  145*  BUN 16 19 21 17   --  15  CREATININE 0.90 1.17 1.09 0.94 1.31* 1.11  CALCIUM 7.9* 7.9* 7.6* 7.7*  --  7.8*  MG  --  2.0 2.1  --   --   --    GFR: Estimated Creatinine Clearance: 50.2 mL/min (by C-G formula based on SCr of 1.11 mg/dL). Liver Function Tests: Recent Labs  Lab 10/02/23 1930  AST 54*  ALT 14  ALKPHOS 113  BILITOT 1.0  PROT 5.7*  ALBUMIN 1.8*   No results for input(s): "LIPASE", "AMYLASE" in the last 168 hours. No results for input(s): "AMMONIA" in the last 168 hours. Coagulation Profile: Recent Labs  Lab 10/02/23 1930  INR 3.4*   Cardiac Enzymes: No results for input(s): "CKTOTAL", "CKMB", "CKMBINDEX", "  TROPONINI" in the last 168 hours. BNP (last 3 results) No results for input(s): "PROBNP" in the last 8760 hours. HbA1C: No results for input(s): "HGBA1C" in the last 72 hours. CBG: Recent Labs  Lab 09/30/23 1045 09/30/23 1639 09/30/23 2128 10/01/23 0609 10/01/23 1118  GLUCAP 259* 198* 139* 141* 217*   Lipid Profile: No results for input(s): "CHOL", "HDL", "LDLCALC", "TRIG", "CHOLHDL", "LDLDIRECT" in the last 72 hours. Thyroid Function Tests: No results for input(s): "TSH", "T4TOTAL", "FREET4", "T3FREE", "THYROIDAB" in the last 72 hours. Anemia Panel: No results for input(s): "VITAMINB12", "FOLATE", "FERRITIN", "TIBC", "IRON", "RETICCTPCT" in the last 72 hours. Sepsis Labs: Recent Labs  Lab 10/02/23 1948 10/02/23 2207 10/03/23 0426  PROCALCITON  --   --  0.20  LATICACIDVEN 2.1* 1.8  --     Recent Results (from the past 240 hour(s))  Body fluid culture w Gram Stain      Status: None   Collection Time: 09/23/23  2:20 PM   Specimen: Synovium; Body Fluid  Result Value Ref Range Status   Specimen Description SYNOVIAL LEFT KNEE  Final   Special Requests NONE  Final   Gram Stain   Final    ABUNDANT WBC PRESENT, PREDOMINANTLY PMN FEW GRAM POSITIVE COCCI IN PAIRS CRITICAL RESULT CALLED TO, READ BACK BY AND VERIFIED WITH: RN ALEXIS MORGAN ON 09/23/23 @ 1909 BY DRT Performed at [facility], [address], [place], [place] [address]    Culture MODERATE STAPHYLOCOCCUS AUREUS  Final   Report Status 09/25/2023 FINAL  Final   Organism ID, Bacteria STAPHYLOCOCCUS AUREUS  Final      Susceptibility   Staphylococcus aureus - MIC*    CIPROFLOXACIN <=0.5 SENSITIVE Sensitive     ERYTHROMYCIN <=0.25 SENSITIVE Sensitive     GENTAMICIN <=0.5 SENSITIVE Sensitive     OXACILLIN 0.5 SENSITIVE Sensitive     TETRACYCLINE <=1 SENSITIVE Sensitive     VANCOMYCIN <=0.5 SENSITIVE Sensitive     TRIMETH/SULFA <=10 SENSITIVE Sensitive     CLINDAMYCIN <=0.25 SENSITIVE Sensitive     RIFAMPIN <=0.5 SENSITIVE Sensitive     Inducible Clindamycin NEGATIVE Sensitive     LINEZOLID 2 SENSITIVE Sensitive     * MODERATE STAPHYLOCOCCUS AUREUS  Aerobic/Anaerobic Culture w Gram Stain (surgical/deep wound)     Status: None (Preliminary result)   Collection Time: 09/28/23  5:00 PM   Specimen: Path fluid; Body Fluid  Result Value Ref Range Status   Specimen Description FLUID  Final   Special Requests left knee infection  Final   Gram Stain   Final    RARE WBC PRESENT, PREDOMINANTLY PMN NO ORGANISMS SEEN Performed at [facility], [address], [place], [place] [address]    Culture   Final    RARE STAPHYLOCOCCUS AUREUS SUSCEPTIBILITIES TO FOLLOW NO ANAEROBES ISOLATED; CULTURE IN PROGRESS FOR 5 DAYS    Report Status PENDING  Incomplete  Resp panel by RT-PCR (RSV, Flu A&B, Covid) Anterior Nasal Swab     Status: None   Collection Time: 10/02/23  7:25 PM   Specimen: Anterior  Nasal Swab  Result Value Ref Range Status   SARS Coronavirus 2 by RT PCR NEGATIVE NEGATIVE Final   Influenza A by PCR NEGATIVE NEGATIVE Final   Influenza B by PCR NEGATIVE NEGATIVE Final    Comment: (NOTE) The Xpert Xpress SARS-CoV-2/FLU/RSV plus assay is intended as an aid in the diagnosis of influenza from Nasopharyngeal swab specimens and should not be used as a sole basis for  treatment. Nasal washings and aspirates are unacceptable for Xpert Xpress SARS-CoV-2/FLU/RSV testing.  Fact Sheet for Patients: BloggerCourse.com  Fact Sheet for Healthcare Providers: SeriousBroker.it  This test is not yet approved or cleared by the [place] FDA and has been authorized for detection and/or diagnosis of SARS-CoV-2 by FDA under an Emergency Use Authorization (EUA). This EUA will remain in effect (meaning this test can be used) for the duration of the COVID-19 declaration under Section 564(b)(1) of the Act, 21 U.S.C. section 360bbb-3(b)(1), unless the authorization is terminated or revoked.     Resp Syncytial Virus by PCR NEGATIVE NEGATIVE Final    Comment: (NOTE) Fact Sheet for Patients: BloggerCourse.com  Fact Sheet for Healthcare Providers: SeriousBroker.it  This test is not yet approved or cleared by the [place] FDA and has been authorized for detection and/or diagnosis of SARS-CoV-2 by FDA under an Emergency Use Authorization (EUA). This EUA will remain in effect (meaning this test can be used) for the duration of the COVID-19 declaration under Section 564(b)(1) of the Act, 21 U.S.C. section 360bbb-3(b)(1), unless the authorization is terminated or revoked.  Performed at [facility], [address] [place], [place] [address]   Blood Culture (routine x 2)     Status: None (Preliminary result)   Collection Time: 10/02/23  7:25 PM   Specimen: BLOOD  Result Value  Ref Range Status   Specimen Description BLOOD LEFT ANTECUBITAL  Final   Special Requests   Final    BOTTLES DRAWN AEROBIC AND ANAEROBIC Blood Culture adequate volume   Culture   Final    NO GROWTH < 24 HOURS Performed at [facility], [address], [place], [place] [address]    Report Status PENDING  Incomplete  Blood Culture (routine x 2)     Status: None (Preliminary result)   Collection Time: 10/02/23  7:32 PM   Specimen: BLOOD  Result Value Ref Range Status   Specimen Description BLOOD RIGHT ANTECUBITAL  Final   Special Requests   Final    BOTTLES DRAWN AEROBIC AND ANAEROBIC Blood Culture results may not be optimal due to an inadequate volume of blood received in culture bottles   Culture   Final    NO GROWTH < 24 HOURS Performed at [facility], [address], [place], [place] [address]    Report Status PENDING  Incomplete         Radiology Studies: DG Chest Port 1 View  Result Date: 10/02/2023 CLINICAL DATA:  Questionable sepsis EXAM: PORTABLE CHEST 1 VIEW COMPARISON:  09/29/2023 FINDINGS: Heart borderline in size. Mediastinal contours within normal limits. Aortic atherosclerosis. Lungs clear. No effusions. Right PICC line in place with the tip in the SVC. IMPRESSION: No active disease. Electronically Signed   By: Charlett Nose M.D.   On: 10/02/2023 22:09        Scheduled Meds:  apixaban  5 mg Oral BID   diltiazem  120 mg Oral Daily   docusate sodium  100 mg Oral BID   Gerhardt's butt cream  1 Application Topical TID   metoprolol tartrate  12.5 mg Oral BID   multivitamin with minerals  1 tablet Oral Q1400   Continuous Infusions:   ceFAZolin (ANCEF) IV 1 g (10/03/23 0601)     LOS: 0 days    Time spent: 56 minutes spent on chart review, discussion with nursing staff, consultants, updating family and interview/physical exam; more than 50% of that time was spent in counseling  and/or coordination of care.    Alvira Philips [place], DO [facility] Available via Epic secure chat 7am-7pm After these hours, please refer to coverage provider listed on [email or web address] 10/03/2023, 11:21 AM

## 2023-10-03 NOTE — Progress Notes (Signed)
Subjective:    Patient reports pain as  patient has no pain at rest but with attempted motion he reports 10/10 pain. .   Denies CP or SOB.  Voiding without difficulty. Positive flatus. Objective: Vital signs in last 24 hours: Temp:  [98.2 F (36.8 C)-98.9 F (37.2 C)] 98.9 F (37.2 C) (10/12 0620) Pulse Rate:  [70-96] 90 (10/12 0620) Resp:  [15-31] 15 (10/12 0620) BP: (120-168)/(67-84) 168/78 (10/12 0620) SpO2:  [95 %-100 %] 100 % (10/12 0620) Weight:  [86.2 kg] 86.2 kg (10/11 1905)  Intake/Output from previous day: 10/11 0701 - 10/12 0700 In: 40 [IV Piggyback:40] Out: -  Intake/Output this shift: No intake/output data recorded.  Recent Labs    10/02/23 1930 10/03/23 0030 10/03/23 0426  HGB 9.1* 7.9* 9.2*   Recent Labs    10/03/23 0030 10/03/23 0426  WBC 6.1 6.9  RBC 2.57* 3.01*  HCT 24.8* 28.5*  PLT 216 255   Recent Labs    10/02/23 1930 10/03/23 0030 10/03/23 0426  NA 139  --  140  K 3.7  --  3.8  CL 101  --  102  CO2 26  --  26  BUN 17  --  15  CREATININE 0.94 1.31* 1.11  GLUCOSE 197*  --  145*  CALCIUM 7.7*  --  7.8*   Recent Labs    10/02/23 1930  INR 3.4*    Neurovascular intact Intact pulses distally Dorsiflexion/Plantar flexion intact Incision: no drainage Compartment soft No DVT Under the Aquacel dressing and there is no evidence of drainage.  He does have ecchymosis around the knee.  Fairly extensively.  There is no evidence of cellulitis or purulence.  He has pain with attempted range of motion.  Assessment/Plan:    Up with therapy Patient is 5 days status post I&D and poly exchange for a septic total knee replacement on the left.  He is currently being treated for M SSA infection on IV cefazolin.  He was admitted through the emergency room with a reported temperature of 101.  He is remained afebrile since.  He has no evidence of sepsis.  The knee shows standard postoperative no indication for a septic joint.  Changes with ecchymosis  swelling but no evidence of drainage or infection.  She had a hematoma is noted as well.  Currently on Eliquis.  I recommend ice and elevation.  Physical therapy for mobilization as tolerated.  Follow-up with his cultures including his urine culture.  Principal Problem:   Septic joint (HCC) Active Problems:   Septic joint of left knee joint (HCC)   Essential hypertension   Chronic atrial fibrillation (HCC)   DM2 (diabetes mellitus, type 2) (HCC)   Protein-calorie malnutrition, moderate (HCC)      Mathew Simpson 10/03/2023, @NOW 

## 2023-10-03 NOTE — Progress Notes (Signed)
Subjective:   This is a pleasant 87 year old male who was seen today for Dr. Lequita Halt. He was recently in the hospital for a left knee I & D and poly swap. He went home with PICC line and IV abx. He was d/c to a SNF on 10/10 and was brought back in yesterday evening due to increasing pain in the left knee and a fever. He complains today of increasing left knee pain and fever. Denies any Numbness or tingling in left lower leg. Denies any calf pain or tightness. Denies chest pain or SOB.  Patient reports pain as 10 on 0-10 scale.    Objective: Vital signs in last 24 hours: Temp:  [98.2 F (36.8 C)-98.9 F (37.2 C)] 98.9 F (37.2 C) (10/12 0620) Pulse Rate:  [70-96] 90 (10/12 0620) Resp:  [15-31] 15 (10/12 0620) BP: (120-168)/(67-84) 168/78 (10/12 0620) SpO2:  [95 %-100 %] 100 % (10/12 0620) Weight:  [86.2 kg] 86.2 kg (10/11 1905)  Intake/Output from previous day: 10/11 0701 - 10/12 0700 In: 40 [IV Piggyback:40] Out: -  Intake/Output this shift: No intake/output data recorded.  Recent Labs    10/02/23 1930 10/03/23 0030 10/03/23 0426  HGB 9.1* 7.9* 9.2*   Recent Labs    10/03/23 0030 10/03/23 0426  WBC 6.1 6.9  RBC 2.57* 3.01*  HCT 24.8* 28.5*  PLT 216 255   Recent Labs    10/02/23 1930 10/03/23 0030 10/03/23 0426  NA 139  --  140  K 3.7  --  3.8  CL 101  --  102  CO2 26  --  26  BUN 17  --  15  CREATININE 0.94 1.31* 1.11  GLUCOSE 197*  --  145*  CALCIUM 7.7*  --  7.8*   Recent Labs    10/02/23 1930  INR 3.4*   On exam of his left knee warm to touch. Painful with any palpation of left knee. Minimal ROM of left knee secondary to pain. He is able to dorisflex and plantarflex left ankle with no pain. Denna Haggard is negative. NVI in left lower extremity. Aquacel left in place.    Assessment/Plan:   Patient is 5 days s/p left knee I & D and poly exchange.   Patient was readmitted last night due to pain in the left knee and a fever. Since admission no fever this am.  Recommending to continue to monitor knee. Swelling is consistent with having a recent knee surgery. Will leave bandage on at this time. Continue to treat pain and abx per ID. Ortho will continue to follow to make sure nothing changes.    Cherie Dark, PA-C [facility] 10/03/2023, 8:02 AM

## 2023-10-03 NOTE — ED Notes (Signed)
ED TO INPATIENT HANDOFF REPORT  ED Nurse Name and Phone #: Fleet Contras, 67  S Name/Age/Gender Mathew Simpson 87 y.o. male Room/Bed: RESUSC/RESUSC  Code Status   Code Status: Limited: Do not attempt resuscitation (DNR) -DNR-LIMITED -Do Not Intubate/DNI   Home/SNF/Other Nursing Home Patient oriented to: self& situation  Is this baseline? Yes   Triage Complete: Triage complete  Chief Complaint Septic joint [facility]) [M00.9]  Triage Note Pt arrives via EMS from [facility]. Pt had surgery on left knee 2 days ago (irrigation and debridement). Pt developed a fever today of 101.4. Pt does have a Picc line and has been receiving abx. EMS administered 650mg  of tylenol and bolus of NS.   Allergies Allergies  Allergen Reactions   Penicillins Anaphylaxis    Tolerates ancef and cephalexin  Did it involve swelling of the face/tongue/throat, SOB, or low BP? Yes Did it involve sudden or severe rash/hives, skin peeling, or any reaction on the inside of your mouth or nose? Unknown Did you need to seek medical attention at a hospital or doctor's office? Yes When did it last happen?      50+ years If all above answers are "NO", may proceed with cephalosporin use.     Level of Care/Admitting Diagnosis ED Disposition     ED Disposition  Admit   Condition  --   Comment  Hospital Area: [facility] [[id number]]  Level of Care: Telemetry Medical [104]  May place patient in observation at [facility] or [facility] if equivalent level of care is available:: Yes  Covid Evaluation: Asymptomatic - no recent exposure (last 10 days) testing not required  Diagnosis: Septic joint [facility]) [[id number]]  Admitting Physician: Sunnie Nielsen [[id number]]  Attending Physician: Sunnie Nielsen [phone]          B Medical/Surgery History Past Medical History:  Diagnosis Date   Arthritis    back,   DJD (degenerative joint disease)    Dysrhythmia    A-fib   Glaucoma     Hypertension    Past Surgical History:  Procedure Laterality Date   EYE SURGERY     bil    I & D KNEE WITH POLY EXCHANGE Left 09/28/2023   Procedure: IRRIGATION AND DEBRIDEMENT KNEE WITH POLY EXCHANGE;  Surgeon: Ollen Gross, MD;  Location: [facility] OR;  Service: Orthopedics;  Laterality: Left;   JOINT REPLACEMENT     knee surgery     arthroscopic   TOTAL KNEE ARTHROPLASTY Left 09/12/2019   Procedure: TOTAL KNEE ARTHROPLASTY;  Surgeon: Ollen Gross, MD;  Location: [facility] ORS;  Service: Orthopedics;  Laterality: Left;    TOTAL KNEE ARTHROPLASTY Right 03/12/2020   Procedure: TOTAL KNEE ARTHROPLASTY;  Surgeon: Ollen Gross, MD;  Location: [facility] ORS;  Service: Orthopedics;  Laterality: Right;      A IV Location/Drains/Wounds Patient Lines/Drains/Airways Status     Active Line/Drains/Airways     Name Placement date Placement time Site Days   Peripheral IV 10/02/23 18 G Anterior;Distal;Left Forearm 10/02/23  1908  Forearm  1   Peripheral IV 10/02/23 20 G 1" Anterior;Left Forearm 10/02/23  1948  Forearm  1   PICC Single Lumen 09/29/23 Right Basilic 39 cm 0 cm 09/29/23  5284  Basilic  4   Wound / Incision (Open or Dehisced) 09/21/23 Skin tear Arm Distal;Lower;Posterior;Right two area of skin tear right posterior hand and right FA 09/21/23  0012  Arm  12   Wound / Incision (Open or Dehisced) 09/28/23 Other (  Comment) Sacrum Posterior;Lower partially 09/28/23  1000  Sacrum  5            Intake/Output Last 24 hours No intake or output data in the 24 hours ending 10/03/23 0101  Labs/Imaging Results for orders placed or performed during the hospital encounter of 10/02/23 (from the past 48 hour(s))  Resp panel by RT-PCR (RSV, Flu A&B, Covid) Anterior Nasal Swab     Status: None   Collection Time: 10/02/23  7:25 PM   Specimen: Anterior Nasal Swab  Result Value Ref Range   SARS Coronavirus 2 by RT PCR NEGATIVE NEGATIVE   Influenza A by PCR NEGATIVE NEGATIVE   Influenza B by PCR  NEGATIVE NEGATIVE    Comment: (NOTE) The Xpert Xpress SARS-CoV-2/FLU/RSV plus assay is intended as an aid in the diagnosis of influenza from Nasopharyngeal swab specimens and should not be used as a sole basis for treatment. Nasal washings and aspirates are unacceptable for Xpert Xpress SARS-CoV-2/FLU/RSV testing.  Fact Sheet for Patients: BloggerCourse.com  Fact Sheet for Healthcare Providers: SeriousBroker.it  This test is not yet approved or cleared by the [place] FDA and has been authorized for detection and/or diagnosis of SARS-CoV-2 by FDA under an Emergency Use Authorization (EUA). This EUA will remain in effect (meaning this test can be used) for the duration of the COVID-19 declaration under Section 564(b)(1) of the Act, 21 U.S.C. section 360bbb-3(b)(1), unless the authorization is terminated or revoked.     Resp Syncytial Virus by PCR NEGATIVE NEGATIVE    Comment: (NOTE) Fact Sheet for Patients: BloggerCourse.com  Fact Sheet for Healthcare Providers: SeriousBroker.it  This test is not yet approved or cleared by the [place] FDA and has been authorized for detection and/or diagnosis of SARS-CoV-2 by FDA under an Emergency Use Authorization (EUA). This EUA will remain in effect (meaning this test can be used) for the duration of the COVID-19 declaration under Section 564(b)(1) of the Act, 21 U.S.C. section 360bbb-3(b)(1), unless the authorization is terminated or revoked.  Performed at [facility], [address], [place], [place] [address]   Comprehensive metabolic panel     Status: Abnormal   Collection Time: 10/02/23  7:30 PM  Result Value Ref Range   Sodium 139 135 - 145 mmol/L   Potassium 3.7 3.5 - 5.1 mmol/L   Chloride 101 98 - 111 mmol/L   CO2 26 22 - 32 mmol/L   Glucose, Bld 197 (H) 70 - 99 mg/dL    Comment: Glucose reference range  applies only to samples taken after fasting for at least 8 hours.   BUN 17 8 - 23 mg/dL   Creatinine, Ser 6.04 0.61 - 1.24 mg/dL   Calcium 7.7 (L) 8.9 - 10.3 mg/dL   Total Protein 5.7 (L) 6.5 - 8.1 g/dL   Albumin 1.8 (L) 3.5 - 5.0 g/dL   AST 54 (H) 15 - 41 U/L   ALT 14 0 - 44 U/L   Alkaline Phosphatase 113 38 - 126 U/L   Total Bilirubin 1.0 0.3 - 1.2 mg/dL   GFR, Estimated >54 >09 mL/min    Comment: (NOTE) Calculated using the CKD-EPI Creatinine Equation (2021)    Anion gap 12 5 - 15    Comment: Performed at [facility], [address], [place], [place] [address]  CBC with Differential     Status: Abnormal   Collection Time: 10/02/23  7:30 PM  Result Value Ref Range   WBC 7.5 4.0 - 10.5  K/uL   RBC 3.02 (L) 4.22 - 5.81 MIL/uL   Hemoglobin 9.1 (L) 13.0 - 17.0 g/dL   HCT 69.6 (L) 29.5 - 28.4 %   MCV 93.4 80.0 - 100.0 fL   MCH 30.1 26.0 - 34.0 pg   MCHC 32.3 30.0 - 36.0 g/dL   RDW 13.2 44.0 - 10.2 %   Platelets 264 150 - 400 K/uL   nRBC 0.0 0.0 - 0.2 %   Neutrophils Relative % 78 %   Neutro Abs 5.9 1.7 - 7.7 K/uL   Lymphocytes Relative 13 %   Lymphs Abs 1.0 0.7 - 4.0 K/uL   Monocytes Relative 6 %   Monocytes Absolute 0.5 0.1 - 1.0 K/uL   Eosinophils Relative 2 %   Eosinophils Absolute 0.1 0.0 - 0.5 K/uL   Basophils Relative 0 %   Basophils Absolute 0.0 0.0 - 0.1 K/uL   Immature Granulocytes 1 %   Abs Immature Granulocytes 0.05 0.00 - 0.07 K/uL    Comment: Performed at [facility], [address], [place], [place] [address]  Protime-INR     Status: Abnormal   Collection Time: 10/02/23  7:30 PM  Result Value Ref Range   Prothrombin Time 34.3 (H) 11.4 - 15.2 seconds   INR 3.4 (H) 0.8 - 1.2    Comment: (NOTE) INR goal varies based on device and disease states. Performed at [facility], [address] [place], [place] [address]   APTT     Status: Abnormal   Collection Time: 10/02/23  7:30 PM  Result Value Ref Range   aPTT 55 (H) 24 - 36 seconds     Comment:        IF BASELINE aPTT IS ELEVATED, SUGGEST PATIENT RISK ASSESSMENT BE USED TO DETERMINE APPROPRIATE ANTICOAGULANT THERAPY. Performed at [facility], [address] [place], [place] [address]   I-Stat Lactic Acid, ED     Status: Abnormal   Collection Time: 10/02/23  7:48 PM  Result Value Ref Range   Lactic Acid, Venous 2.1 (HH) 0.5 - 1.9 mmol/L   Comment NOTIFIED PHYSICIAN   I-Stat Lactic Acid, ED     Status: None   Collection Time: 10/02/23 10:07 PM  Result Value Ref Range   Lactic Acid, Venous 1.8 0.5 - 1.9 mmol/L  Urinalysis, w/ Reflex to Culture (Infection Suspected) -Urine, Clean Catch     Status: Abnormal   Collection Time: 10/02/23 10:27 PM  Result Value Ref Range   Specimen Source URINE, CLEAN CATCH    Color, Urine YELLOW YELLOW   APPearance HAZY (A) CLEAR   Specific Gravity, Urine 1.018 1.005 - 1.030   pH 5.0 5.0 - 8.0   Glucose, UA 50 (A) NEGATIVE mg/dL   Hgb urine dipstick LARGE (A) NEGATIVE   Bilirubin Urine NEGATIVE NEGATIVE   Ketones, ur NEGATIVE NEGATIVE mg/dL   Protein, ur 30 (A) NEGATIVE mg/dL   Nitrite NEGATIVE NEGATIVE   Leukocytes,Ua TRACE (A) NEGATIVE   RBC / HPF >50 0 - 5 RBC/hpf   WBC, UA 11-20 0 - 5 WBC/hpf    Comment:        Reflex urine culture not performed if WBC <=10, OR if Squamous epithelial cells >5. If Squamous epithelial cells >5 suggest recollection.    Bacteria, UA NONE SEEN NONE SEEN   Squamous Epithelial / HPF 0-5 0 - 5 /HPF   Mucus PRESENT    Hyaline Casts, UA PRESENT     Comment: Performed at  [facility], [address] [place], [place] [address]   DG Chest Port 1 View  Result Date: 10/02/2023 CLINICAL DATA:  Questionable sepsis EXAM: PORTABLE CHEST 1 VIEW COMPARISON:  09/29/2023 FINDINGS: Heart borderline in size. Mediastinal contours within normal limits. Aortic atherosclerosis. Lungs clear. No effusions. Right PICC line in place with the tip in the SVC. IMPRESSION: No active disease. Electronically  Signed   By: Charlett Nose M.D.   On: 10/02/2023 22:09    Pending Labs Unresulted Labs (From admission, onward)     Start     Ordered   10/10/23 0500  Creatinine, serum  (enoxaparin (LOVENOX)    CrCl >/= 30 ml/min)  Weekly,   R     Comments: while on enoxaparin therapy    10/03/23 0031   10/03/23 0500  Basic metabolic panel  Tomorrow morning,   R        10/03/23 0031   10/03/23 0500  CBC  Tomorrow morning,   R        10/03/23 0031   10/03/23 0030  CBC  (enoxaparin (LOVENOX)    CrCl >/= 30 ml/min)  Once,   R       Comments: Baseline for enoxaparin therapy IF NOT ALREADY DRAWN.  Notify MD if PLT < 100 K.    10/03/23 0031   10/03/23 0030  Creatinine, serum  (enoxaparin (LOVENOX)    CrCl >/= 30 ml/min)  Once,   R       Comments: Baseline for enoxaparin therapy IF NOT ALREADY DRAWN.    10/03/23 0031   10/02/23 2227  Urine Culture  Once,   R        10/02/23 2227   10/02/23 1925  Blood Culture (routine x 2)  (Septic presentation on arrival (screening labs, nursing and treatment orders for obvious sepsis))  BLOOD CULTURE X 2,   STAT      10/02/23 1925            Vitals/Pain Today's Vitals   10/02/23 2045 10/02/23 2100 10/02/23 2314 10/03/23 0045  BP: 133/68 132/77  (!) 141/69  Pulse: 83 75  70  Resp: (!) 23 (!) 21  (!) 24  Temp:   98.2 F (36.8 C)   TempSrc:   Oral   SpO2: 100% 100%  95%  Weight:      Height:      PainSc:        Isolation Precautions No active isolations  Medications Medications  oxyCODONE (Oxy IR/ROXICODONE) immediate release tablet 5 mg (has no administration in time range)  lactated ringers bolus 1,000 mL (0 mLs Intravenous Stopped 10/02/23 2117)  ceFAZolin (ANCEF) IVPB 2g/100 mL premix (0 g Intravenous Stopped 10/02/23 2334)    Mobility walks with device     Focused Assessments PT is alert and confused, A&O x2. PT VS WNL. PT has swollen left knee, painful to touch.    R Recommendations: See Admitting Provider Note  Report given to:    Additional Notes: Call phone for further assistance if needed.

## 2023-10-04 ENCOUNTER — Observation Stay (HOSPITAL_COMMUNITY): Payer: Medicare HMO

## 2023-10-04 DIAGNOSIS — Z96659 Presence of unspecified artificial knee joint: Secondary | ICD-10-CM | POA: Diagnosis not present

## 2023-10-04 DIAGNOSIS — T8459XD Infection and inflammatory reaction due to other internal joint prosthesis, subsequent encounter: Secondary | ICD-10-CM | POA: Diagnosis not present

## 2023-10-04 LAB — PROCALCITONIN: Procalcitonin: 0.26 ng/mL

## 2023-10-04 LAB — CBC
HCT: 27.3 % — ABNORMAL LOW (ref 39.0–52.0)
Hemoglobin: 8.8 g/dL — ABNORMAL LOW (ref 13.0–17.0)
MCH: 29.7 pg (ref 26.0–34.0)
MCHC: 32.2 g/dL (ref 30.0–36.0)
MCV: 92.2 fL (ref 80.0–100.0)
Platelets: 246 10*3/uL (ref 150–400)
RBC: 2.96 MIL/uL — ABNORMAL LOW (ref 4.22–5.81)
RDW: 13.8 % (ref 11.5–15.5)
WBC: 7.3 10*3/uL (ref 4.0–10.5)
nRBC: 0 % (ref 0.0–0.2)

## 2023-10-04 LAB — URINE CULTURE: Culture: <10000 — AB

## 2023-10-04 LAB — BASIC METABOLIC PANEL
Anion gap: 12 (ref 5–15)
BUN: 14 mg/dL (ref 8–23)
CO2: 28 mmol/L (ref 22–32)
Calcium: 8 mg/dL — ABNORMAL LOW (ref 8.9–10.3)
Chloride: 98 mmol/L (ref 98–111)
Creatinine, Ser: 0.98 mg/dL (ref 0.61–1.24)
GFR, Estimated: >60 mL/min (ref >60)
Glucose, Bld: 151 mg/dL — ABNORMAL HIGH (ref 70–99)
Potassium: 3.7 mmol/L (ref 3.5–5.1)
Sodium: 138 mmol/L (ref 135–145)

## 2023-10-04 LAB — LACTIC ACID, PLASMA: Lactic Acid, Venous: 1.1 mmol/L (ref 0.5–1.9)

## 2023-10-04 NOTE — Progress Notes (Signed)
VAS [place] UPPER EXTREMITY VENOUS DUPLEX  Result Date: 10/04/2023 UPPER VENOUS STUDY  Patient Name:  Mathew Simpson  Date of Exam:   10/04/2023 Medical Rec #: [id number]      Accession #:    [id number] Date of Birth: 08-Jul-1936       Patient Gender: M Patient Age:   87 years Exam Location:  [facility] Procedure:      VAS [place] UPPER EXTREMITY VENOUS DUPLEX Referring Phys: Chrystian Cupples [place] --------------------------------------------------------------------------------  Indications: Fever & positive d-dimer (2.08) Other Indications: PICC line RUE (basilic). Limitations: Line and patient condition. Comparison Study: No previous exams Performing Technologist: Jody Hill RVT, RDMS  Examination Guidelines: A complete evaluation includes B-mode imaging, spectral Doppler, color Doppler, and power Doppler as needed of all accessible portions of each vessel. Bilateral testing is considered an integral part of a complete examination. Limited examinations for reoccurring indications may be performed as noted.  Right Findings: +----------+------------+---------+-----------+----------+--------------------+ RIGHT     CompressiblePhasicitySpontaneousProperties      Summary        +----------+------------+---------+-----------+----------+--------------------+ IJV           Full       Yes       Yes                                   +----------+------------+---------+-----------+----------+--------------------+ Subclavian               Yes       Yes                   patent by                                                               color/doppler     +----------+------------+---------+-----------+----------+--------------------+ Axillary      Full       Yes       Yes                                   +----------+------------+---------+-----------+----------+--------------------+ Brachial                                               Not visualized    +----------+------------+---------+-----------+----------+--------------------+ Radial        Full                                                       +----------+------------+---------+-----------+----------+--------------------+ Ulnar         Full                                                       +----------+------------+---------+-----------+----------+--------------------+ Cephalic      None  PROGRESS NOTE    Mathew Simpson  WUJ:[id number] DOB: 1936-04-01 DOA: 10/02/2023 PCP: [name], PA-C    Brief Narrative:   Mathew Simpson is a 87 y.o. 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 Lequita Halt on 10/7 who recently discharged to [facility] SNF on 10/10 on IV antibiotics who presented to [facility] ED on 10/12 with report of fever.  Facility reports fever of 101.4.  Patient reports significant pain to the left knee, denies cough, no dysuria.  Has PICC line in place and has been receiving IV antibiotics from recent bacteremia/knee joint infection.  EMS administered Tylenol and 500 mL bolus and transported to the ED for further evaluation.  In the ED, temperature 98.7 F, HR 93, RR 22, BP 120/73, SpO2 98% on room air.  WBC 7.5, hemoglobin 9.1, platelet count 264.  Sodium 139, potassium 3.7, chloride 101, CO2 26, glucose 197, BUN 17, creatinine 0.94.  AST 54, ALT 14, total bilirubin 1.0.  Lactic acid 2.1>1.8.  COVID/flu/RSV PCR negative.  Urinalysis with trace leukocytes, negative nitrite, no bacteria, 11-20 WBCs.  Chest x-ray with no active cardiopulmonary disease process.  Blood cultures x 2 and urine culture obtained.  Patient was continued on IV Novox with cefazolin as previously prescribed.  Orthopedics was consulted who recommended medicine admission; and will follow patient while inpatient.  TRH consulted for admission.  Assessment & Plan:   Fever Recent MSSA bacteremia 2/2 left knee periprosthetic joint infection Patient presenting to the ED from SNF with fever reportedly 101.4 F.  Recent MSSA bacteremia secondary to left knee periprosthetic joint infection in which patient underwent I&D with poly exchange on 09/28/2023.  Was seen by  infectious disease during previous hospitalization and discharged on cefazolin via PICC line with projected end date 11/09/2023.  Chest x-ray unrevealing, urinalysis negative.  Patient does have significant erythema, swelling to left knee. -- Orthopedics following, appreciate assistance; believes this is typical postop changes and no further surgical invention required at this time -- Procalcitonin 0.20, reassuring -- Blood cultures x 2: No growth x 2 days -- Continue antibiotics with IV cefazolin 1 g IV every 8 hours -- Continue monitor fever curve, supportive care, antipyretics -- CBC daily  Right cephalic vein superficial venous thrombosis D-dimer elevated.  Vascular duplex ultrasound right upper extremity negative for DVT but findings consistent with acute superficial vein thrombosis.  Left lower extremity venous duplex ultrasound negative for DVT. --Supportive care  Persistent atrial fibrillation on anticoagulation Essential hypertension -- Metoprolol tartrate 12.5 mg p.o. twice daily -- Eliquis 5 mg p.o. twice daily -- Monitor on telemetry  Type 2 diabetes mellitus Hemoglobin A1c 6.7 on 09/08/2023, well-controlled.  Diet controlled at baseline. -- CBG qAM  History of recent TBI/SDH for mechanical fall Presenting from SNF -- PT/OT eval -- Fall precautions   DVT prophylaxis:  apixaban (ELIQUIS) tablet 5 mg    Code Status: Limited: Do not attempt resuscitation (DNR) -DNR-LIMITED -Do Not Intubate/DNI  Family Communication: No family present at bedside this morning  Disposition Plan:  Level of care: Med-Surg Status is: Observation The patient remains OBS appropriate and will d/c before 2 midnights.    Consultants:  Orthopedics  Procedures:  None  Antimicrobials:  Cefazolin   Subjective: Patient seen examined bedside, resting calmly.  Lying in bed.  PROGRESS NOTE    Mathew Simpson  WUJ:[id number] DOB: 1936-04-01 DOA: 10/02/2023 PCP: [name], PA-C    Brief Narrative:   Mathew Simpson is a 87 y.o. 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 Lequita Halt on 10/7 who recently discharged to [facility] SNF on 10/10 on IV antibiotics who presented to [facility] ED on 10/12 with report of fever.  Facility reports fever of 101.4.  Patient reports significant pain to the left knee, denies cough, no dysuria.  Has PICC line in place and has been receiving IV antibiotics from recent bacteremia/knee joint infection.  EMS administered Tylenol and 500 mL bolus and transported to the ED for further evaluation.  In the ED, temperature 98.7 F, HR 93, RR 22, BP 120/73, SpO2 98% on room air.  WBC 7.5, hemoglobin 9.1, platelet count 264.  Sodium 139, potassium 3.7, chloride 101, CO2 26, glucose 197, BUN 17, creatinine 0.94.  AST 54, ALT 14, total bilirubin 1.0.  Lactic acid 2.1>1.8.  COVID/flu/RSV PCR negative.  Urinalysis with trace leukocytes, negative nitrite, no bacteria, 11-20 WBCs.  Chest x-ray with no active cardiopulmonary disease process.  Blood cultures x 2 and urine culture obtained.  Patient was continued on IV Novox with cefazolin as previously prescribed.  Orthopedics was consulted who recommended medicine admission; and will follow patient while inpatient.  TRH consulted for admission.  Assessment & Plan:   Fever Recent MSSA bacteremia 2/2 left knee periprosthetic joint infection Patient presenting to the ED from SNF with fever reportedly 101.4 F.  Recent MSSA bacteremia secondary to left knee periprosthetic joint infection in which patient underwent I&D with poly exchange on 09/28/2023.  Was seen by  infectious disease during previous hospitalization and discharged on cefazolin via PICC line with projected end date 11/09/2023.  Chest x-ray unrevealing, urinalysis negative.  Patient does have significant erythema, swelling to left knee. -- Orthopedics following, appreciate assistance; believes this is typical postop changes and no further surgical invention required at this time -- Procalcitonin 0.20, reassuring -- Blood cultures x 2: No growth x 2 days -- Continue antibiotics with IV cefazolin 1 g IV every 8 hours -- Continue monitor fever curve, supportive care, antipyretics -- CBC daily  Right cephalic vein superficial venous thrombosis D-dimer elevated.  Vascular duplex ultrasound right upper extremity negative for DVT but findings consistent with acute superficial vein thrombosis.  Left lower extremity venous duplex ultrasound negative for DVT. --Supportive care  Persistent atrial fibrillation on anticoagulation Essential hypertension -- Metoprolol tartrate 12.5 mg p.o. twice daily -- Eliquis 5 mg p.o. twice daily -- Monitor on telemetry  Type 2 diabetes mellitus Hemoglobin A1c 6.7 on 09/08/2023, well-controlled.  Diet controlled at baseline. -- CBG qAM  History of recent TBI/SDH for mechanical fall Presenting from SNF -- PT/OT eval -- Fall precautions   DVT prophylaxis:  apixaban (ELIQUIS) tablet 5 mg    Code Status: Limited: Do not attempt resuscitation (DNR) -DNR-LIMITED -Do Not Intubate/DNI  Family Communication: No family present at bedside this morning  Disposition Plan:  Level of care: Med-Surg Status is: Observation The patient remains OBS appropriate and will d/c before 2 midnights.    Consultants:  Orthopedics  Procedures:  None  Antimicrobials:  Cefazolin   Subjective: Patient seen examined bedside, resting calmly.  Lying in bed.  PROGRESS NOTE    Mathew Simpson  WUJ:[id number] DOB: 1936-04-01 DOA: 10/02/2023 PCP: [name], PA-C    Brief Narrative:   Mathew Simpson is a 87 y.o. 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 Lequita Halt on 10/7 who recently discharged to [facility] SNF on 10/10 on IV antibiotics who presented to [facility] ED on 10/12 with report of fever.  Facility reports fever of 101.4.  Patient reports significant pain to the left knee, denies cough, no dysuria.  Has PICC line in place and has been receiving IV antibiotics from recent bacteremia/knee joint infection.  EMS administered Tylenol and 500 mL bolus and transported to the ED for further evaluation.  In the ED, temperature 98.7 F, HR 93, RR 22, BP 120/73, SpO2 98% on room air.  WBC 7.5, hemoglobin 9.1, platelet count 264.  Sodium 139, potassium 3.7, chloride 101, CO2 26, glucose 197, BUN 17, creatinine 0.94.  AST 54, ALT 14, total bilirubin 1.0.  Lactic acid 2.1>1.8.  COVID/flu/RSV PCR negative.  Urinalysis with trace leukocytes, negative nitrite, no bacteria, 11-20 WBCs.  Chest x-ray with no active cardiopulmonary disease process.  Blood cultures x 2 and urine culture obtained.  Patient was continued on IV Novox with cefazolin as previously prescribed.  Orthopedics was consulted who recommended medicine admission; and will follow patient while inpatient.  TRH consulted for admission.  Assessment & Plan:   Fever Recent MSSA bacteremia 2/2 left knee periprosthetic joint infection Patient presenting to the ED from SNF with fever reportedly 101.4 F.  Recent MSSA bacteremia secondary to left knee periprosthetic joint infection in which patient underwent I&D with poly exchange on 09/28/2023.  Was seen by  infectious disease during previous hospitalization and discharged on cefazolin via PICC line with projected end date 11/09/2023.  Chest x-ray unrevealing, urinalysis negative.  Patient does have significant erythema, swelling to left knee. -- Orthopedics following, appreciate assistance; believes this is typical postop changes and no further surgical invention required at this time -- Procalcitonin 0.20, reassuring -- Blood cultures x 2: No growth x 2 days -- Continue antibiotics with IV cefazolin 1 g IV every 8 hours -- Continue monitor fever curve, supportive care, antipyretics -- CBC daily  Right cephalic vein superficial venous thrombosis D-dimer elevated.  Vascular duplex ultrasound right upper extremity negative for DVT but findings consistent with acute superficial vein thrombosis.  Left lower extremity venous duplex ultrasound negative for DVT. --Supportive care  Persistent atrial fibrillation on anticoagulation Essential hypertension -- Metoprolol tartrate 12.5 mg p.o. twice daily -- Eliquis 5 mg p.o. twice daily -- Monitor on telemetry  Type 2 diabetes mellitus Hemoglobin A1c 6.7 on 09/08/2023, well-controlled.  Diet controlled at baseline. -- CBG qAM  History of recent TBI/SDH for mechanical fall Presenting from SNF -- PT/OT eval -- Fall precautions   DVT prophylaxis:  apixaban (ELIQUIS) tablet 5 mg    Code Status: Limited: Do not attempt resuscitation (DNR) -DNR-LIMITED -Do Not Intubate/DNI  Family Communication: No family present at bedside this morning  Disposition Plan:  Level of care: Med-Surg Status is: Observation The patient remains OBS appropriate and will d/c before 2 midnights.    Consultants:  Orthopedics  Procedures:  None  Antimicrobials:  Cefazolin   Subjective: Patient seen examined bedside, resting calmly.  Lying in bed.  PROGRESS NOTE    Mathew Simpson  WUJ:[id number] DOB: 1936-04-01 DOA: 10/02/2023 PCP: [name], PA-C    Brief Narrative:   Mathew Simpson is a 87 y.o. 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 Lequita Halt on 10/7 who recently discharged to [facility] SNF on 10/10 on IV antibiotics who presented to [facility] ED on 10/12 with report of fever.  Facility reports fever of 101.4.  Patient reports significant pain to the left knee, denies cough, no dysuria.  Has PICC line in place and has been receiving IV antibiotics from recent bacteremia/knee joint infection.  EMS administered Tylenol and 500 mL bolus and transported to the ED for further evaluation.  In the ED, temperature 98.7 F, HR 93, RR 22, BP 120/73, SpO2 98% on room air.  WBC 7.5, hemoglobin 9.1, platelet count 264.  Sodium 139, potassium 3.7, chloride 101, CO2 26, glucose 197, BUN 17, creatinine 0.94.  AST 54, ALT 14, total bilirubin 1.0.  Lactic acid 2.1>1.8.  COVID/flu/RSV PCR negative.  Urinalysis with trace leukocytes, negative nitrite, no bacteria, 11-20 WBCs.  Chest x-ray with no active cardiopulmonary disease process.  Blood cultures x 2 and urine culture obtained.  Patient was continued on IV Novox with cefazolin as previously prescribed.  Orthopedics was consulted who recommended medicine admission; and will follow patient while inpatient.  TRH consulted for admission.  Assessment & Plan:   Fever Recent MSSA bacteremia 2/2 left knee periprosthetic joint infection Patient presenting to the ED from SNF with fever reportedly 101.4 F.  Recent MSSA bacteremia secondary to left knee periprosthetic joint infection in which patient underwent I&D with poly exchange on 09/28/2023.  Was seen by  infectious disease during previous hospitalization and discharged on cefazolin via PICC line with projected end date 11/09/2023.  Chest x-ray unrevealing, urinalysis negative.  Patient does have significant erythema, swelling to left knee. -- Orthopedics following, appreciate assistance; believes this is typical postop changes and no further surgical invention required at this time -- Procalcitonin 0.20, reassuring -- Blood cultures x 2: No growth x 2 days -- Continue antibiotics with IV cefazolin 1 g IV every 8 hours -- Continue monitor fever curve, supportive care, antipyretics -- CBC daily  Right cephalic vein superficial venous thrombosis D-dimer elevated.  Vascular duplex ultrasound right upper extremity negative for DVT but findings consistent with acute superficial vein thrombosis.  Left lower extremity venous duplex ultrasound negative for DVT. --Supportive care  Persistent atrial fibrillation on anticoagulation Essential hypertension -- Metoprolol tartrate 12.5 mg p.o. twice daily -- Eliquis 5 mg p.o. twice daily -- Monitor on telemetry  Type 2 diabetes mellitus Hemoglobin A1c 6.7 on 09/08/2023, well-controlled.  Diet controlled at baseline. -- CBG qAM  History of recent TBI/SDH for mechanical fall Presenting from SNF -- PT/OT eval -- Fall precautions   DVT prophylaxis:  apixaban (ELIQUIS) tablet 5 mg    Code Status: Limited: Do not attempt resuscitation (DNR) -DNR-LIMITED -Do Not Intubate/DNI  Family Communication: No family present at bedside this morning  Disposition Plan:  Level of care: Med-Surg Status is: Observation The patient remains OBS appropriate and will d/c before 2 midnights.    Consultants:  Orthopedics  Procedures:  None  Antimicrobials:  Cefazolin   Subjective: Patient seen examined bedside, resting calmly.  Lying in bed.  PCR NEGATIVE NEGATIVE Final    Comment: (NOTE) Fact Sheet for Patients: BloggerCourse.com  Fact Sheet for Healthcare Providers: SeriousBroker.it  This test is not yet approved or cleared by the [place] FDA and has been authorized for detection and/or diagnosis of SARS-CoV-2 by FDA under an Emergency Use Authorization (EUA). This EUA will remain in effect (meaning this test can be used) for the duration of the COVID-19 declaration under Section 564(b)(1) of the Act, 21 U.S.C. section 360bbb-3(b)(1), unless the authorization is terminated or revoked.  Performed at [facility], [address] [place], [place] [address]   Blood Culture (routine x 2)     Status: None (Preliminary result)   Collection Time: 10/02/23  7:25 PM   Specimen: BLOOD  Result Value Ref Range Status   Specimen Description BLOOD LEFT ANTECUBITAL  Final   Special Requests   Final    BOTTLES DRAWN AEROBIC AND ANAEROBIC Blood Culture adequate volume   Culture   Final    NO GROWTH 2 DAYS Performed at [facility], [address], [place], [place] [address]    Report Status PENDING  Incomplete  Blood Culture (routine x 2)     Status: None (Preliminary result)   Collection Time: 10/02/23  7:32 PM   Specimen: BLOOD  Result Value  Ref Range Status   Specimen Description BLOOD RIGHT ANTECUBITAL  Final   Special Requests   Final    BOTTLES DRAWN AEROBIC AND ANAEROBIC Blood Culture results may not be optimal due to an inadequate volume of blood received in culture bottles   Culture   Final    NO GROWTH 2 DAYS Performed at [facility], [address] [place], [place] [address]    Report Status PENDING  Incomplete  Urine Culture     Status: Abnormal   Collection Time: 10/02/23 10:27 PM   Specimen: Urine, Random  Result Value Ref Range Status   Specimen Description URINE, RANDOM  Final   Special Requests NONE Reflexed from [id number]  Final   Culture (A)  Final    <10,000 COLONIES/mL INSIGNIFICANT GROWTH Performed at [facility], [address] [place], [place] [address]    Report Status 10/04/2023 FINAL  Final         Radiology Studies: VAS [place] LOWER EXTREMITY VENOUS (DVT)  Result Date: 10/04/2023  Lower Venous DVT Study Patient Name:  JORAM VENSON  Date of Exam:   10/04/2023 Medical Rec #: [id number]      Accession #:    [id number] Date of Birth: April 15, 1936       Patient Gender: M Patient Age:   71 years Exam Location:  [facility] Procedure:      VAS [place] LOWER EXTREMITY VENOUS (DVT) Referring Phys: Haruo Stepanek [place] --------------------------------------------------------------------------------  Indications: Pain, and Swelling. Other Indications: Septic left knee joint. Comparison Study: Previous exam 09/18/2019 was negative for DVT. Performing Technologist: Ernestene Mention RVT, RDMS  Examination Guidelines: A complete evaluation includes B-mode imaging, spectral Doppler, color Doppler, and power Doppler as needed of all accessible portions of each vessel. Bilateral testing is considered an integral part of a complete examination. Limited examinations for reoccurring indications may be performed as noted. The reflux portion of the exam is performed with the patient in reverse Trendelenburg.   +-----+---------------+---------+-----------+----------+--------------+ RIGHTCompressibilityPhasicitySpontaneityPropertiesThrombus Aging +-----+---------------+---------+-----------+----------+--------------+ CFV  Full           Yes      Yes                                 +-----+---------------+---------+-----------+----------+--------------+   +---------+---------------+---------+-----------+----------+--------------+  PROGRESS NOTE    Mathew Simpson  WUJ:[id number] DOB: 1936-04-01 DOA: 10/02/2023 PCP: [name], PA-C    Brief Narrative:   Mathew Simpson is a 87 y.o. 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 Lequita Halt on 10/7 who recently discharged to [facility] SNF on 10/10 on IV antibiotics who presented to [facility] ED on 10/12 with report of fever.  Facility reports fever of 101.4.  Patient reports significant pain to the left knee, denies cough, no dysuria.  Has PICC line in place and has been receiving IV antibiotics from recent bacteremia/knee joint infection.  EMS administered Tylenol and 500 mL bolus and transported to the ED for further evaluation.  In the ED, temperature 98.7 F, HR 93, RR 22, BP 120/73, SpO2 98% on room air.  WBC 7.5, hemoglobin 9.1, platelet count 264.  Sodium 139, potassium 3.7, chloride 101, CO2 26, glucose 197, BUN 17, creatinine 0.94.  AST 54, ALT 14, total bilirubin 1.0.  Lactic acid 2.1>1.8.  COVID/flu/RSV PCR negative.  Urinalysis with trace leukocytes, negative nitrite, no bacteria, 11-20 WBCs.  Chest x-ray with no active cardiopulmonary disease process.  Blood cultures x 2 and urine culture obtained.  Patient was continued on IV Novox with cefazolin as previously prescribed.  Orthopedics was consulted who recommended medicine admission; and will follow patient while inpatient.  TRH consulted for admission.  Assessment & Plan:   Fever Recent MSSA bacteremia 2/2 left knee periprosthetic joint infection Patient presenting to the ED from SNF with fever reportedly 101.4 F.  Recent MSSA bacteremia secondary to left knee periprosthetic joint infection in which patient underwent I&D with poly exchange on 09/28/2023.  Was seen by  infectious disease during previous hospitalization and discharged on cefazolin via PICC line with projected end date 11/09/2023.  Chest x-ray unrevealing, urinalysis negative.  Patient does have significant erythema, swelling to left knee. -- Orthopedics following, appreciate assistance; believes this is typical postop changes and no further surgical invention required at this time -- Procalcitonin 0.20, reassuring -- Blood cultures x 2: No growth x 2 days -- Continue antibiotics with IV cefazolin 1 g IV every 8 hours -- Continue monitor fever curve, supportive care, antipyretics -- CBC daily  Right cephalic vein superficial venous thrombosis D-dimer elevated.  Vascular duplex ultrasound right upper extremity negative for DVT but findings consistent with acute superficial vein thrombosis.  Left lower extremity venous duplex ultrasound negative for DVT. --Supportive care  Persistent atrial fibrillation on anticoagulation Essential hypertension -- Metoprolol tartrate 12.5 mg p.o. twice daily -- Eliquis 5 mg p.o. twice daily -- Monitor on telemetry  Type 2 diabetes mellitus Hemoglobin A1c 6.7 on 09/08/2023, well-controlled.  Diet controlled at baseline. -- CBG qAM  History of recent TBI/SDH for mechanical fall Presenting from SNF -- PT/OT eval -- Fall precautions   DVT prophylaxis:  apixaban (ELIQUIS) tablet 5 mg    Code Status: Limited: Do not attempt resuscitation (DNR) -DNR-LIMITED -Do Not Intubate/DNI  Family Communication: No family present at bedside this morning  Disposition Plan:  Level of care: Med-Surg Status is: Observation The patient remains OBS appropriate and will d/c before 2 midnights.    Consultants:  Orthopedics  Procedures:  None  Antimicrobials:  Cefazolin   Subjective: Patient seen examined bedside, resting calmly.  Lying in bed.

## 2023-10-04 NOTE — Progress Notes (Signed)
   Subjective:    Patient reports pain as moderate   Patient seen in rounds for Dr. Lequita Halt. Patient has had no acute complaints or problems. No acute events overnight. Resting in bed on exam this morning. Foley catheter in place. Has not been up with PT.  Objective: Vital signs in last 24 hours: Temp:  [97.8 F (36.6 C)-99.5 F (37.5 C)] 97.8 F (36.6 C) (10/13 0836) Pulse Rate:  [81-93] 93 (10/13 0836) Resp:  [17-21] 18 (10/13 0836) BP: (138-153)/(67-85) 146/77 (10/13 0836) SpO2:  [97 %-98 %] 97 % (10/13 0836)  Intake/Output from previous day:  Intake/Output Summary (Last 24 hours) at 10/04/2023 0926 Last data filed at 10/04/2023 0848 Gross per 24 hour  Intake 470 ml  Output 1100 ml  Net -630 ml     Intake/Output this shift: Total I/O In: 110 [P.O.:110] Out: 300 [Urine:300]  Labs: Recent Labs    10/02/23 1930 10/03/23 0030 10/03/23 0426 10/04/23 0628  HGB 9.1* 7.9* 9.2* 8.8*   Recent Labs    10/03/23 0426 10/04/23 0628  WBC 6.9 7.3  RBC 3.01* 2.96*  HCT 28.5* 27.3*  PLT 255 246   Recent Labs    10/03/23 0426 10/04/23 0628  NA 140 138  K 3.8 3.7  CL 102 98  CO2 26 28  BUN 15 14  CREATININE 1.11 0.98  GLUCOSE 145* 151*  CALCIUM 7.8* 8.0*   Recent Labs    10/02/23 1930  INR 3.4*    Exam: General - Patient is Alert and Oriented Extremity - Neurologically intact Sensation intact distally Intact pulses distally Dorsiflexion/Plantar flexion intact Appropriate swelling and warmth post op knee Dressing - scant drainage proximally, intact otherwise Motor Function - intact, moving foot and toes well on exam.   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   Weight as of this encounter: 86.2 kg.  Continue ice & elevation Pain meds as needed IV ancef   No growth so far on blood cultures or urine cultures  Patient's presentation is consistent with his postoperative state from left knee I&D. No indications for further surgical intervention at this point. Hopefully up with PT as able from a pain standpoint.  Rosalene Billings, PA-C Orthopedic Surgery [phone] 10/04/2023, 9:26 AM

## 2023-10-04 NOTE — Evaluation (Signed)
Occupational Therapy Evaluation Patient Details Name: Mathew Simpson MRN: [id number] DOB: Jun 29, 1936 Today's Date: 10/04/2023   History of Present Illness 87 y.o. male who presents to [facility] 10/11 with a fever. Recently discharged on to [facility] on 10/10. 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 Educated pt on LE exercises to continue with strengthening in preparation for functional transfers to complete ADLs. Currently needs significant assist with LB ADLs and Min to setup assist for UB ADLs. Pt would benefit from continued acute skilled OT services to address deficits and help transition to next level of care. Patient would benefit from post acute skilled rehab facility with <3 hours of therapy and 24/7 support        If plan is discharge home, recommend the following: A lot of help with walking and/or transfers;Two people to help with walking and/or transfers;Two people to help with bathing/dressing/bathroom;A lot of help with bathing/dressing/bathroom;Assistance with cooking/housework;Assist for transportation    Functional Status Assessment  Patient has had a recent decline in their functional status and demonstrates the ability to make significant improvements in function in a reasonable and predictable amount of time.  Equipment Recommendations  Other (comment) (defer)    Recommendations for Other Services       Precautions / Restrictions Precautions Precautions: Fall Restrictions Weight Bearing Restrictions: Yes LLE Weight Bearing: Weight bearing as tolerated       Mobility Bed Mobility Overal bed mobility: Needs Assistance Bed Mobility: Rolling, Sidelying to Sit, Sit to Supine Rolling: Max assist, Used rails Sidelying to sit: Max assist, HOB elevated   Sit to supine: Total assist   General bed mobility comments: needs BLE management, mostly with LLE due to pain and difficulty moving it    Transfers                   General transfer comment: defer      Balance Overall balance assessment: Needs assistance Sitting-balance support: Feet supported, Single extremity supported Sitting balance-Leahy Scale: Fair Sitting balance - Comments: EOB, close to poor due to infrequent UE support                                   ADL either performed or assessed with clinical judgement   ADL   Eating/Feeding: Set up;Bed level   Grooming: Bed level;Set up   Upper Body Bathing: Bed level;Set up   Lower Body Bathing: Maximal assistance       Lower Body Dressing: Total assistance     Toilet Transfer Details (indicate cue type and reason): deferred due to knee pain   Toileting - Clothing Manipulation Details (indicate cue type and reason): defer       General ADL Comments: educated pt on LE exericses to maintain joint integrity and prepare for OOB transfers     Vision         Perception         Praxis         Pertinent Vitals/Pain Pain Assessment Pain Assessment: Faces Faces Pain Scale: Hurts whole lot Pain Location: L  knee Pain Descriptors / Indicators: Grimacing, Guarding, Aching Pain Intervention(s): Limited activity within patient's tolerance, Monitored during session, Repositioned     Extremity/Trunk Assessment Upper Extremity Assessment Upper Extremity Assessment: Generalized weakness   Lower Extremity Assessment Lower Extremity Assessment: Defer to PT evaluation LLE Deficits / Details: Limited knee flexion, significant swelling   Cervical / Trunk Assessment Cervical / Trunk Assessment:  Kyphotic   Communication Communication Communication: Hearing impairment Cueing Techniques: Verbal cues;Gestural cues   Cognition Arousal: Alert Behavior During Therapy: WFL for tasks assessed/performed Overall Cognitive Status: Within Functional Limits for tasks assessed                                       General Comments  VSS    Exercises General Exercises - Lower Extremity Long Arc Quad: Strengthening, Seated, 10 reps, Left Heel Slides: AAROM, Supine, Left, 10 reps Hip Flexion/Marching: 5 reps, Left, AROM, Seated, Strengthening   Shoulder Instructions      Home Living Family/patient expects to be discharged to:: Skilled nursing facility                                 Additional Comments: Recent DC to rehab, working on standing      Prior Functioning/Environment               Mobility Comments: has been working on standing at rehab ADLs Comments: assist with ADLs        OT Problem List: Decreased strength;Pain      OT Treatment/Interventions: Self-care/ADL training;Canalith reposition;Therapeutic activities;Patient/family education;DME and/or AE instruction;Neuromuscular education;Balance training    OT Goals(Current goals can be found in the care plan section)    OT Frequency: Min 1X/week    Co-evaluation              AM-PAC OT "6 Clicks" Daily Activity     Outcome Measure Help from another person eating meals?: A Little Help from another person taking care of personal grooming?: A Little Help from another person toileting, which includes using toliet, bedpan, or urinal?: Total Help from another person bathing (including washing, rinsing, drying)?: A Lot Help from another person to put on and taking off regular upper body clothing?: A Little Help from another person to put on and taking off regular lower body clothing?: Total 6 Click Score: 13   End of Session Nurse Communication: Mobility status  Activity  Tolerance: Patient limited by pain Patient left: in bed;with call bell/phone within reach;with family/visitor present  OT Visit Diagnosis: Muscle weakness (generalized) (M62.81);Pain Pain - Right/Left: Left Pain - part of body: Knee                Time: 1610-9604 OT Time Calculation (min): 36 min Charges:  OT General Charges $OT Visit: 1 Visit OT Treatments $Therapeutic Activity: 8-22 mins $Therapeutic Exercise: 8-22 mins  10/04/2023  AB, OTR/L  Acute Rehabilitation Services  Office: [phone]   Tristan Schroeder 10/04/2023, 1:52 PM

## 2023-10-04 NOTE — Progress Notes (Signed)
RUE and LLE venous duplexes have been completed.   Results can be found under chart review under CV PROC. 10/04/2023 1:17 PM Tequita Marrs RVT, RDMS

## 2023-10-04 NOTE — Evaluation (Signed)
Physical Therapy Evaluation Patient Details Name: Mathew Simpson MRN: [id number] DOB: [birth year] Today's Date: 10/04/2023  History of Present Illness  87 y.o. 87 y.o. male who presents to [facility] 10/11 with a fever. Recently discharged to [facility] on 10/10 after being admitted for over a month after a fall with ICH on 9/17. 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    Clinical Impression  Pt in bed upon arrival of PT, agreeable to evaluation at this time. Prior to admission the pt was working on standing at rehab, required mod-maxA of 2 to complete sit-stand transfers with PT last admission and had been dependent on staff to assist with ADLs at rehab. The pt presents with significant deficits in LLE ROM and strength, as well as generally decreased activity tolerance, stability, and coordination. Pt with multiple episodes of posterior LOB with sitting EOB when asked to move LE, and was unable to achieve full stand at EOB due to pain in LLE with stance and generally poor strength and power in BLE. Continue to recommend skilled PT acutely to progress functional ROM, strength, stability, and activity tolerance. Recommend return to post-acute rehab <3hours/day when medically stable for d/c.      If plan is discharge home, recommend the following: Two people to help with walking and/or transfers;Two people to help with bathing/dressing/bathroom;Assist for transportation;Help with stairs or ramp for entrance;Assistance with cooking/housework   Can travel by private vehicle   No    Equipment Recommendations Other (comment) (defer to post acute)  Recommendations for Other Services       Functional Status Assessment Patient has had a recent decline in their functional status and demonstrates the ability to make significant improvements in  function in a reasonable and predictable amount of time.     Precautions / Restrictions Precautions Precautions: Fall Restrictions Weight Bearing Restrictions: Yes LLE Weight Bearing: Weight bearing as tolerated Other Position/Activity Restrictions: WBAT L LE      Mobility  Bed Mobility Overal bed mobility: Needs Assistance Bed Mobility: Rolling, Sidelying to Sit, Sit to Supine Rolling: Max assist   Supine to sit: Max assist, +2 for physical assistance, Used rails Sit to supine: Max assist, +2 for physical assistance, Used rails   General bed mobility comments: assist to amnage BLE and trunk, assist to scoot to EOB and along EOB. assist of 2 to reposition in bed    Transfers Overall transfer level: Needs assistance Equipment used: Rolling walker (2 wheels) Transfers: Sit to/from Stand, Bed to chair/wheelchair/BSC Sit to Stand: Max assist, +2 physical assistance          Lateral/Scoot Transfers: Max assist, +2 physical assistance General transfer comment: maxA of 2 to stand with use of bed pad to extend at hips, maxA of 2 with sequential cues to scoot laterally along EOB      Balance Overall balance assessment: Needs assistance Sitting-balance support: Feet supported, Single extremity supported Sitting balance-Leahy Scale: Fair Sitting balance - Comments: EOB, close to poor due to infrequent UE support   Standing balance support: Bilateral upper extremity supported Standing balance-Leahy Scale: Zero Standing balance comment: dependent on BUE support and assist at hips to maintain upright. maintained <5 seconds                             Pertinent Vitals/Pain Pain  Assessment Pain Assessment: Faces Faces Pain Scale: Hurts whole lot Pain Location: "everywhere" Pain Descriptors / Indicators: Grimacing, Guarding, Aching Pain Intervention(s): Limited activity within patient's tolerance, Monitored during session, Repositioned, RN gave pain meds during  session    Home Living Family/patient expects to be discharged to:: Skilled nursing facility                        Prior Function Prior Level of Function : Patient poor historian/Family not available             Mobility Comments: has been working on standing at rehab ADLs Comments: assist with ADLs     Extremity/Trunk Assessment   Upper Extremity Assessment Upper Extremity Assessment: Defer to OT evaluation    Lower Extremity Assessment Lower Extremity Assessment: Generalized weakness;RLE deficits/detail;LLE deficits/detail RLE Deficits / Details: good movement against gravity and grossly4/5 to MMT reports sensation intact LLE Deficits / Details: Limited knee flexion, significant swelling LLE: Unable to fully assess due to pain    Cervical / Trunk Assessment Cervical / Trunk Assessment: Kyphotic  Communication   Communication Communication: Hearing impairment Cueing Techniques: Verbal cues;Gestural cues;Tactile cues;Visual cues  Cognition Arousal: Alert Behavior During Therapy: WFL for tasks assessed/performed Overall Cognitive Status: Impaired/Different from baseline Area of Impairment: Orientation, Following commands, Safety/judgement, Awareness, Problem solving                 Orientation Level: Disoriented to, Situation, Time     Following Commands: Follows one step commands inconsistently, Follows one step commands with increased time Safety/Judgement: Decreased awareness of safety, Decreased awareness of deficits Awareness: Intellectual Problem Solving: Slow processing, Decreased initiation, Difficulty sequencing, Requires verbal cues General Comments: pt continued to ask "how do I do that" when given instructions. reports he remembers OT but not OT's name. thought OT came ysterday but it was actually this morning. did know he was at [facility]        General Comments General comments (skin integrity, edema, etc.): VSS    Exercises      Assessment/Plan    PT Assessment Patient needs continued PT services  PT Problem List Decreased strength;Decreased balance;Decreased cognition;Decreased mobility;Decreased activity tolerance;Pain;Decreased knowledge of use of DME       PT Treatment Interventions DME instruction;Functional mobility training;Balance training;Patient/family education;Therapeutic activities;Gait training;Therapeutic exercise;Cognitive remediation    PT Goals (Current goals can be found in the Care Plan section)  Acute Rehab PT Goals Patient Stated Goal: get stronger PT Goal Formulation: With patient Time For Goal Achievement: 10/18/23 Potential to Achieve Goals: Fair    Frequency Min 1X/week     Co-evaluation PT/OT/SLP Co-Evaluation/Treatment: Yes Reason for Co-Treatment: Complexity of the patient's impairments (multi-system involvement);For patient/therapist safety;To address functional/ADL transfers PT goals addressed during session: Mobility/safety with mobility;Balance;Proper use of DME;Strengthening/ROM         AM-PAC PT "6 Clicks" Mobility  Outcome Measure Help needed turning from your back to your side while in a flat bed without using bedrails?: A Lot Help needed moving from lying on your back to sitting on the side of a flat bed without using bedrails?: Total Help needed moving to and from a bed to a chair (including a wheelchair)?: Total Help needed standing up from a chair using your arms (e.g., wheelchair or bedside chair)?: Total Help needed to walk in hospital room?: Total Help needed climbing 3-5 steps with a railing? : Total 6 Click Score: 7    End of Session Equipment Utilized During  Treatment: Gait belt;Oxygen Activity Tolerance: Patient limited by pain Patient left: in bed;with call bell/phone within reach;with bed alarm set;with nursing/sitter in room Nurse Communication: Mobility status;Patient requests pain meds PT Visit Diagnosis: Other abnormalities of gait and  mobility (R26.89);Muscle weakness (generalized) (M62.81);Pain BPPV - Right/Left : Right Pain - Right/Left: Left Pain - part of body: Knee    Time: 1610-9604 PT Time Calculation (min) (ACUTE ONLY): 30 min   Charges:   PT Evaluation $PT Eval Low Complexity: 1 Low PT Treatments $Therapeutic Activity: 8-22 mins PT General Charges $$ ACUTE PT VISIT: 1 Visit         Vickki Muff, PT, DPT   Acute Rehabilitation Department Office [phone] Secure Chat Communication Preferred  Ronnie Derby 10/04/2023, 5:42 PM

## 2023-10-05 DIAGNOSIS — T8454XD Infection and inflammatory reaction due to internal left knee prosthesis, subsequent encounter: Secondary | ICD-10-CM

## 2023-10-05 DIAGNOSIS — B9561 Methicillin susceptible Staphylococcus aureus infection as the cause of diseases classified elsewhere: Secondary | ICD-10-CM | POA: Diagnosis not present

## 2023-10-05 DIAGNOSIS — R7881 Bacteremia: Secondary | ICD-10-CM | POA: Diagnosis not present

## 2023-10-05 LAB — SYNOVIAL CELL COUNT + DIFF, W/ CRYSTALS
Crystals, Fluid: NONE SEEN
Eosinophils-Synovial: 0 % (ref 0–1)
Lymphocytes-Synovial Fld: 0 % (ref 0–20)
Monocyte-Macrophage-Synovial Fluid: 2 % — ABNORMAL LOW (ref 50–90)
Neutrophil, Synovial: 98 % — ABNORMAL HIGH (ref 0–25)
WBC, Synovial: 25667 /mm3 — ABNORMAL HIGH (ref 0–200)

## 2023-10-05 MED ORDER — LIDOCAINE HCL 1 % IJ SOLN
10.0000 mL | Freq: Once | INTRAMUSCULAR | Status: AC
  Administered 2023-10-05: 10 mL
  Filled 2023-10-05: qty 10

## 2023-10-05 MED ORDER — CEFAZOLIN SODIUM-DEXTROSE 2-4 GM/100ML-% IV SOLN
2.0000 g | Freq: Three times a day (TID) | INTRAVENOUS | Status: DC
  Administered 2023-10-05 – 2023-10-08 (×10): 2 g via INTRAVENOUS
  Filled 2023-10-05 (×10): qty 100

## 2023-10-05 NOTE — Plan of Care (Signed)
  Problem: Activity: Goal: Range of joint motion will improve Outcome: Progressing   Problem: Clinical Measurements: Goal: Will remain free from infection Outcome: Progressing   Problem: Activity: Goal: Risk for activity intolerance will decrease Outcome: Progressing

## 2023-10-05 NOTE — Progress Notes (Incomplete)
PHARMACY CONSULT NOTE FOR:   OUTPATIENT  PARENTERAL ANTIBIOTIC THERAPY (OPAT)   Indication: MSSA L-knee PJI Regimen: Cefazolin 2g IV every 8 hours End date: 11/09/23 (6 weeks from OR 09/28/23)   IV antibiotic discharge orders are pended. To discharging provider:  please sign these orders via discharge navigator,  Select New Orders & click on the button choice - Manage This Unsigned Work.      Thank you for allowing pharmacy to be a part of this patient's care.  Jani Gravel, PharmD Clinical Pharmacist  10/05/2023 10:41 AM

## 2023-10-05 NOTE — Progress Notes (Signed)
   Subjective:   Mathew Simpson, 87 y/o male is one week s/p left knee I&D with polyethylene revision. History of periprosthetic joint infection secondary to MSSA bacteremia. He was discharged to SNF last week, but readmitted over the weekend due to fever. Reports left knee pain and soreness today.   Objective: Vital signs in last 24 hours: Temp:  [98.5 F (36.9 C)-99.4 F (37.4 C)] 98.6 F (37 C) (10/14 1351) Pulse Rate:  [85-100] 90 (10/14 1351) Resp:  [18-19] 18 (10/14 1351) BP: (132-143)/(63-89) 132/68 (10/14 1351) SpO2:  [96 %-99 %] 96 % (10/14 1351)  Intake/Output from previous day:  Intake/Output Summary (Last 24 hours) at 10/05/2023 1515 Last data filed at 10/05/2023 0852 Gross per 24 hour  Intake 360 ml  Output 1575 ml  Net -1215 ml    Intake/Output this shift: Total I/O In: -  Out: 1025 [Urine:1025]  Labs: Recent Labs    10/02/23 1930 10/03/23 0030 10/03/23 0426 10/04/23 0628  HGB 9.1* 7.9* 9.2* 8.8*   Recent Labs    10/03/23 0426 10/04/23 0628  WBC 6.9 7.3  RBC 3.01* 2.96*  HCT 28.5* 27.3*  PLT 255 246   Recent Labs    10/03/23 0426 10/04/23 0628  NA 140 138  K 3.8 3.7  CL 102 98  CO2 26 28  BUN 15 14  CREATININE 1.11 0.98  GLUCOSE 145* 151*  CALCIUM 7.8* 8.0*   Recent Labs    10/02/23 1930  INR 3.4*    Exam: General - Patient is Alert and Oriented Extremity - Left knee is swollen and shows a moderate effusion. Slightly warm to touch. Dressing/Incision - Aquacel dressing in place. Blanchable erythema at the distal portion. Unable to elevate leg due to discomfort to determine if this dissipates.  Motor Function - intact, moving foot and toes well on exam.   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   Weight as of this encounter: 86.2 kg.  ASSESSMENT: Status post left knee I&D with polyethylene revision  PLAN: Will attempt an aspiration today given swelling and overall presentation. Notified RN that Dr. Lequita Halt will be by later this afternoon to perform procedure.   Arther Abbott, PA-C Orthopedic Surgery [phone] 10/05/2023, 3:15 PM

## 2023-10-05 NOTE — [place] FL2 (Signed)
[place] MEDICAID FL2 LEVEL OF CARE FORM     IDENTIFICATION  Patient Name: Mathew Simpson Birthdate: March 15, 1936 Sex: male Admission Date (Current Location): 10/02/2023  [facility] and [place] Number:  Producer, television/film/video and Address:  [facility] [place], [address] [place], [place] [address]      Provider Number: [id number]  Attending Physician Name and Address:  [place], Alvira Philips, DO  Relative Name and Phone Number:       Current Level of Care: Hospital Recommended Level of Care: Skilled Nursing Facility Prior Approval Number:    Date Approved/Denied:   PASRR Number: [id number]  Discharge Plan: SNF    Current Diagnoses: Patient Active Problem List   Diagnosis Date Noted   Septic joint (HCC) 10/03/2023   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   Staphylococcus aureus bacteremia 09/23/2023   Effusion of left knee 09/23/2023   SDH (subdural hematoma) (HCC) 09/17/2023   Bradycardia 09/16/2023   Protein-calorie malnutrition, moderate (HCC) 09/16/2023   Scalp laceration 09/12/2023   Fall 09/10/2023   Bleeding in head following injury with loss of consciousness (HCC) 09/08/2023   Subdural hematoma (HCC) 09/08/2023   DM2 (diabetes mellitus, type 2) (HCC) 09/08/2023   Neck injury 09/08/2023   Malnutrition of moderate degree 07/18/2022   Cellulitis of left foot 07/17/2022   Sepsis due to cellulitis (HCC) 07/17/2022   Chronic atrial fibrillation (HCC) 07/17/2022   Hypokalemia 07/17/2022   Mild protein malnutrition (HCC) 07/17/2022   Hyperbilirubinemia 07/17/2022   Primary osteoarthritis of right knee 03/12/2020   Cellulitis of left lower extremity    Septic joint of left knee joint (HCC) 09/15/2019   Acute blood loss anemia 09/15/2019   Essential hypertension 09/15/2019   OA (osteoarthritis) of knee 09/12/2019    Orientation RESPIRATION BLADDER Height & Weight     Self, Time, Place, Situation  Normal Incontinent, External catheter Weight:  190 lb (86.2 kg) Height:  5\' 10"  (177.8 cm)  BEHAVIORAL SYMPTOMS/MOOD NEUROLOGICAL BOWEL NUTRITION STATUS      Incontinent Diet (See discharge summary)  AMBULATORY STATUS COMMUNICATION OF NEEDS Skin   Total Care Verbally Surgical wounds                       Personal Care Assistance Level of Assistance  Bathing, Feeding, Dressing, Total care Bathing Assistance: Maximum assistance Feeding assistance: Limited assistance Dressing Assistance: Maximum assistance Total Care Assistance: Maximum assistance   Functional Limitations Info  Hearing, Speech, Sight Sight Info: Adequate Hearing Info: Impaired Speech Info: Adequate    SPECIAL CARE FACTORS FREQUENCY        PT Frequency: 5x/week OT Frequency: 5x/week            Contractures Contractures Info: Not present    Additional Factors Info  Code Status, Allergies Code Status Info: DNR Allergies Info: Penicillins           Current Medications (10/05/2023):  This is the current hospital active medication list Current Facility-Administered Medications  Medication Dose Route Frequency Provider Last Rate Last Admin   acetaminophen (TYLENOL) tablet 650 mg  650 mg Oral Q6H PRN Sunnie Nielsen, DO   650 mg at 10/05/23 1546   apixaban (ELIQUIS) tablet 5 mg  5 mg Oral BID Sunnie Nielsen, DO   5 mg at 10/05/23 0846   bisacodyl (DULCOLAX) suppository 10 mg  10 mg Rectal Daily PRN Sunnie Nielsen, DO       ceFAZolin (ANCEF) IVPB 2g/100 mL premix  2  g Intravenous Q8H Paytes, Austin A, RPH 200 mL/hr at 10/05/23 1415 2 g at 10/05/23 1415   Chlorhexidine Gluconate Cloth 2 % PADS 6 each  6 each Topical Q0600 [place], Eric J, DO   6 each at 10/05/23 0550   diltiazem (CARDIZEM CD) 24 hr capsule 120 mg  120 mg Oral Daily Sunnie Nielsen, DO   120 mg at 10/05/23 0846   docusate sodium (COLACE) capsule 100 mg  100 mg Oral BID Sunnie Nielsen, DO   100 mg at 10/05/23 7846   Gerhardt's butt cream 1 Application  1 Application  Topical TID Sunnie Nielsen, DO   1 Application at 10/05/23 1600   lidocaine (XYLOCAINE) 1 % (with pres) injection 10 mL  10 mL Other Once Edmisten, Kristie L, PA       magnesium hydroxide (MILK OF MAGNESIA) suspension 30 mL  30 mL Oral Daily PRN Sunnie Nielsen, DO       metoprolol tartrate (LOPRESSOR) tablet 12.5 mg  12.5 mg Oral BID Sunnie Nielsen, DO   12.5 mg at 10/05/23 0846   multivitamin with minerals tablet 1 tablet  1 tablet Oral Q1400 Sunnie Nielsen, DO   1 tablet at 10/05/23 1411   ondansetron (ZOFRAN) tablet 4 mg  4 mg Oral Q6H PRN Sunnie Nielsen, DO       oxyCODONE (Oxy IR/ROXICODONE) immediate release tablet 5 mg  5 mg Oral Q4H PRN Sunnie Nielsen, DO   5 mg at 10/05/23 1546   polyethylene glycol (MIRALAX / GLYCOLAX) packet 17 g  17 g Oral Daily PRN Sunnie Nielsen, DO         Discharge Medications: Please see discharge summary for a list of discharge medications.  Relevant Imaging Results:  Relevant Lab Results:   Additional Information SSN: [id number] [place], [place]

## 2023-10-05 NOTE — TOC Initial Note (Signed)
Transition of Care [facility]) - Initial/Assessment Note    Patient Details  Name: Mathew Simpson MRN: [id number] Date of Birth: Sep 03, 1936  [facility] CM/SW Contact:    Lorri Frederick, LCSW Phone Number: 10/05/2023, 4:00 PM  Clinical Narrative:        Pt oriented x2, unable to participate in conversation.  CSW spoke with granddaughter [name].  She confirmed pt is from [facility] and she does want pt to return.  Pt daughter, Thomas's mother out of the country.  Pt lives home alone, no current services.  CSW spoke with [employer] who confirmed pt can return, does require new auth.  SNF auth request submitted in [place].             Expected Discharge Plan: Skilled Nursing Facility Barriers to Discharge: Continued Medical Work up   Patient Goals and CMS Choice     Choice offered to / list presented to : Adult Children (granddaughter: Thomasa)      Expected Discharge Plan and Services In-house Referral: Clinical Social Work   Post Acute Care Choice: Skilled Nursing Facility Living arrangements for the past 2 months: Single Family Home                                      Prior Living Arrangements/Services Living arrangements for the past 2 months: Single Family Home Lives with:: Self Patient language and need for interpreter reviewed:: Yes        Need for Family Participation in Patient Care: Yes (Comment) Care giver support system in place?: Yes (comment) Current home services: Other (comment) (none) Criminal Activity/Legal Involvement Pertinent to Current Situation/Hospitalization: No - Comment as needed  Activities of Daily Living      Permission Sought/Granted                  Emotional Assessment Appearance:: Appears stated age Attitude/Demeanor/Rapport: Lethargic Affect (typically observed): Unable to Assess Orientation: : Oriented to Self, Oriented to Place      Admission diagnosis:  Septic joint (HCC) [M00.9] Fever,  unspecified fever cause [R50.9] Patient Active Problem List   Diagnosis Date Noted   Septic joint (HCC) 10/03/2023   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   Staphylococcus aureus bacteremia 09/23/2023   Effusion of left knee 09/23/2023   SDH (subdural hematoma) (HCC) 09/17/2023   Bradycardia 09/16/2023   Protein-calorie malnutrition, moderate (HCC) 09/16/2023   Scalp laceration 09/12/2023   Fall 09/10/2023   Bleeding in head following injury with loss of consciousness (HCC) 09/08/2023   Subdural hematoma (HCC) 09/08/2023   DM2 (diabetes mellitus, type 2) (HCC) 09/08/2023   Neck injury 09/08/2023   Malnutrition of moderate degree 07/18/2022   Cellulitis of left foot 07/17/2022   Sepsis due to cellulitis (HCC) 07/17/2022   Chronic atrial fibrillation (HCC) 07/17/2022   Hypokalemia 07/17/2022   Mild protein malnutrition (HCC) 07/17/2022   Hyperbilirubinemia 07/17/2022   Primary osteoarthritis of right knee 03/12/2020   Cellulitis of left lower extremity    Septic joint of left knee joint (HCC) 09/15/2019   Acute blood loss anemia 09/15/2019   Essential hypertension 09/15/2019   OA (osteoarthritis) of knee 09/12/2019   PCP:  [name], PA-C Pharmacy:   [facility] [phone] - [place], [place] - [address] AT [address] OF [facility] [address] [place] [address] Phone: [phone] Fax: [phone]  [place] [facility] [address] [address] Phone: [phone] Fax: [phone]     Social Determinants of Health (SDOH) Social History: SDOH Screenings   Food Insecurity: No Food Insecurity (09/10/2023)  Housing: Low Risk  (09/10/2023)  Transportation Needs: No Transportation Needs (09/10/2023)  Utilities: Not At Risk (09/10/2023)  Tobacco Use: Medium Risk (10/02/2023)   SDOH Interventions:     Readmission Risk Interventions    07/23/2022   11:40 AM 07/21/2022   10:11 AM  Readmission Risk  Prevention Plan  Post Dischage Appt Complete Complete  Medication Screening Complete Complete  Transportation Screening Complete Complete

## 2023-10-05 NOTE — Progress Notes (Signed)
PROGRESS NOTE    HAKIM MINNIEFIELD  EXB:[id number] DOB: 11-Feb-1936 DOA: 10/02/2023 PCP: [name], PA-C    Brief Narrative:   Mathew Simpson is a 87 y.o. 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 Lequita Halt on 10/7 who recently discharged to [facility] on 10/10 on IV antibiotics who presented to [facility] ED on 10/12 with report of fever.  Facility reports fever of 101.4.  Patient reports significant pain to the left knee, denies cough, no dysuria.  Has PICC line in place and has been receiving IV antibiotics from recent bacteremia/knee joint infection.  EMS administered Tylenol and 500 mL bolus and transported to the ED for further evaluation.  In the ED, temperature 98.7 F, HR 93, RR 22, BP 120/73, SpO2 98% on room air.  WBC 7.5, hemoglobin 9.1, platelet count 264.  Sodium 139, potassium 3.7, chloride 101, CO2 26, glucose 197, BUN 17, creatinine 0.94.  AST 54, ALT 14, total bilirubin 1.0.  Lactic acid 2.1>1.8.  COVID/flu/RSV PCR negative.  Urinalysis with trace leukocytes, negative nitrite, no bacteria, 11-20 WBCs.  Chest x-ray with no active cardiopulmonary disease process.  Blood cultures x 2 and urine culture obtained.  Patient was continued on IV Novox with cefazolin as previously prescribed.  Orthopedics was consulted who recommended medicine admission; and will follow patient while inpatient.  TRH consulted for admission.  Assessment & Plan:   Fever Recent MSSA bacteremia 2/2 left knee periprosthetic joint infection Patient presenting to the ED from SNF with fever reportedly 101.4 F.  Recent MSSA bacteremia secondary to left knee periprosthetic joint infection in which patient underwent I&D with poly exchange on 09/28/2023.  Was seen by  infectious disease during previous hospitalization and discharged on cefazolin via PICC line with projected end date 11/09/2023.  Chest x-ray unrevealing, urinalysis negative.  Patient does have significant erythema, swelling to left knee. -- Orthopedics following, appreciate assistance; reports this is typical postop changes and no further surgical invention required at this time -- Procalcitonin 0.20, reassuring -- Blood cultures x 2: No growth x 3 days -- Continue antibiotics with IV cefazolin 1 g IV every 8 hours -- Continue monitor fever curve, supportive care, antipyretics -- CBC daily  Right cephalic vein superficial venous thrombosis D-dimer elevated.  Vascular duplex ultrasound right upper extremity negative for DVT but findings consistent with acute superficial vein thrombosis.  Left lower extremity venous duplex ultrasound negative for DVT. --Supportive care  Persistent atrial fibrillation on anticoagulation Essential hypertension -- Metoprolol tartrate 12.5 mg p.o. twice daily -- Eliquis 5 mg p.o. twice daily -- Monitor on telemetry  Type 2 diabetes mellitus Hemoglobin A1c 6.7 on 09/08/2023, well-controlled.  Diet controlled at baseline. -- CBG qAM  History of recent TBI/SDH for mechanical fall Presenting from SNF -- PT/OT following with recommendations of return to SNF -- Fall precautions -- TOC consulted   DVT prophylaxis:  apixaban (ELIQUIS) tablet 5 mg    Code Status: Limited: Do not attempt resuscitation (DNR) -DNR-LIMITED -Do Not Intubate/DNI  Family Communication: No family present at bedside this morning  Disposition Plan:  Level of care: Med-Surg Status is: Observation The patient remains OBS appropriate and will d/c before 2 midnights.    Consultants:  Orthopedics  Procedures:  None  Antimicrobials:  Cefazolin   Subjective: Patient seen  PROGRESS NOTE    HAKIM MINNIEFIELD  EXB:[id number] DOB: 11-Feb-1936 DOA: 10/02/2023 PCP: [name], PA-C    Brief Narrative:   Mathew Simpson is a 87 y.o. 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 Lequita Halt on 10/7 who recently discharged to [facility] on 10/10 on IV antibiotics who presented to [facility] ED on 10/12 with report of fever.  Facility reports fever of 101.4.  Patient reports significant pain to the left knee, denies cough, no dysuria.  Has PICC line in place and has been receiving IV antibiotics from recent bacteremia/knee joint infection.  EMS administered Tylenol and 500 mL bolus and transported to the ED for further evaluation.  In the ED, temperature 98.7 F, HR 93, RR 22, BP 120/73, SpO2 98% on room air.  WBC 7.5, hemoglobin 9.1, platelet count 264.  Sodium 139, potassium 3.7, chloride 101, CO2 26, glucose 197, BUN 17, creatinine 0.94.  AST 54, ALT 14, total bilirubin 1.0.  Lactic acid 2.1>1.8.  COVID/flu/RSV PCR negative.  Urinalysis with trace leukocytes, negative nitrite, no bacteria, 11-20 WBCs.  Chest x-ray with no active cardiopulmonary disease process.  Blood cultures x 2 and urine culture obtained.  Patient was continued on IV Novox with cefazolin as previously prescribed.  Orthopedics was consulted who recommended medicine admission; and will follow patient while inpatient.  TRH consulted for admission.  Assessment & Plan:   Fever Recent MSSA bacteremia 2/2 left knee periprosthetic joint infection Patient presenting to the ED from SNF with fever reportedly 101.4 F.  Recent MSSA bacteremia secondary to left knee periprosthetic joint infection in which patient underwent I&D with poly exchange on 09/28/2023.  Was seen by  infectious disease during previous hospitalization and discharged on cefazolin via PICC line with projected end date 11/09/2023.  Chest x-ray unrevealing, urinalysis negative.  Patient does have significant erythema, swelling to left knee. -- Orthopedics following, appreciate assistance; reports this is typical postop changes and no further surgical invention required at this time -- Procalcitonin 0.20, reassuring -- Blood cultures x 2: No growth x 3 days -- Continue antibiotics with IV cefazolin 1 g IV every 8 hours -- Continue monitor fever curve, supportive care, antipyretics -- CBC daily  Right cephalic vein superficial venous thrombosis D-dimer elevated.  Vascular duplex ultrasound right upper extremity negative for DVT but findings consistent with acute superficial vein thrombosis.  Left lower extremity venous duplex ultrasound negative for DVT. --Supportive care  Persistent atrial fibrillation on anticoagulation Essential hypertension -- Metoprolol tartrate 12.5 mg p.o. twice daily -- Eliquis 5 mg p.o. twice daily -- Monitor on telemetry  Type 2 diabetes mellitus Hemoglobin A1c 6.7 on 09/08/2023, well-controlled.  Diet controlled at baseline. -- CBG qAM  History of recent TBI/SDH for mechanical fall Presenting from SNF -- PT/OT following with recommendations of return to SNF -- Fall precautions -- TOC consulted   DVT prophylaxis:  apixaban (ELIQUIS) tablet 5 mg    Code Status: Limited: Do not attempt resuscitation (DNR) -DNR-LIMITED -Do Not Intubate/DNI  Family Communication: No family present at bedside this morning  Disposition Plan:  Level of care: Med-Surg Status is: Observation The patient remains OBS appropriate and will d/c before 2 midnights.    Consultants:  Orthopedics  Procedures:  None  Antimicrobials:  Cefazolin   Subjective: Patient seen  NEGATIVE NEGATIVE Final    Comment: (NOTE) Fact Sheet for Patients: BloggerCourse.com  Fact Sheet for Healthcare Providers: SeriousBroker.it  This test is not yet approved or cleared by the [place] FDA and has been authorized for detection and/or diagnosis of SARS-CoV-2 by FDA under an Emergency Use Authorization (EUA). This EUA will remain in effect (meaning this test can be used) for the duration of the COVID-19 declaration under Section 564(b)(1) of the Act, 21 U.S.C. section 360bbb-3(b)(1), unless the authorization is terminated or revoked.  Performed at [facility], [address], [place], [place] [address]   Blood Culture (routine x 2)     Status: None (Preliminary result)   Collection Time: 10/02/23  7:25 PM   Specimen: BLOOD  Result Value Ref Range Status   Specimen Description BLOOD LEFT ANTECUBITAL  Final   Special Requests   Final    BOTTLES DRAWN AEROBIC AND ANAEROBIC Blood Culture adequate volume   Culture   Final    NO GROWTH 3 DAYS Performed at [facility], [address], [place], [place] [address]    Report Status PENDING  Incomplete  Blood Culture (routine x 2)     Status: None (Preliminary result)   Collection Time: 10/02/23  7:32 PM   Specimen: BLOOD  Result Value  Ref Range Status   Specimen Description BLOOD RIGHT ANTECUBITAL  Final   Special Requests   Final    BOTTLES DRAWN AEROBIC AND ANAEROBIC Blood Culture results may not be optimal due to an inadequate volume of blood received in culture bottles   Culture   Final    NO GROWTH 3 DAYS Performed at [facility], [address], [place], [place] [address]    Report Status PENDING  Incomplete  Urine Culture     Status: Abnormal   Collection Time: 10/02/23 10:27 PM   Specimen: Urine, Random  Result Value Ref Range Status   Specimen Description URINE, RANDOM  Final   Special Requests NONE Reflexed from [id number]  Final   Culture (A)  Final    <10,000 COLONIES/mL INSIGNIFICANT GROWTH Performed at [facility], [address], [place], [place] [address]    Report Status 10/04/2023 FINAL  Final         Radiology Studies: VAS [place] UPPER EXTREMITY VENOUS DUPLEX  Result Date: 10/04/2023 UPPER VENOUS STUDY  Patient Name:  JAWANN URBANI  Date of Exam:   10/04/2023 Medical Rec #: [id number]      Accession #:    [id number] Date of Birth: [birth year]       Patient Gender: M Patient Age:   28 years Exam Location:  [facility] Procedure:      VAS [place] UPPER EXTREMITY VENOUS DUPLEX Referring Phys: Zaidy Absher [place] --------------------------------------------------------------------------------  Indications: Fever & positive d-dimer (2.08) Other Indications: PICC line RUE (basilic). Limitations: Line and patient condition. Comparison Study: No previous exams Performing Technologist: Jody Hill RVT, RDMS  Examination Guidelines: A complete evaluation includes B-mode imaging, spectral Doppler, color Doppler, and power Doppler as needed of all accessible portions of each vessel. Bilateral testing is considered an integral part of a complete examination. Limited examinations for reoccurring indications may be performed as noted.  Right Findings:  +----------+------------+---------+-----------+----------+--------------------+ RIGHT     CompressiblePhasicitySpontaneousProperties      Summary        +----------+------------+---------+-----------+----------+--------------------+ IJV           Full       Yes  PROGRESS NOTE    HAKIM MINNIEFIELD  EXB:[id number] DOB: 11-Feb-1936 DOA: 10/02/2023 PCP: [name], PA-C    Brief Narrative:   Mathew Simpson is a 87 y.o. 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 Lequita Halt on 10/7 who recently discharged to [facility] on 10/10 on IV antibiotics who presented to [facility] ED on 10/12 with report of fever.  Facility reports fever of 101.4.  Patient reports significant pain to the left knee, denies cough, no dysuria.  Has PICC line in place and has been receiving IV antibiotics from recent bacteremia/knee joint infection.  EMS administered Tylenol and 500 mL bolus and transported to the ED for further evaluation.  In the ED, temperature 98.7 F, HR 93, RR 22, BP 120/73, SpO2 98% on room air.  WBC 7.5, hemoglobin 9.1, platelet count 264.  Sodium 139, potassium 3.7, chloride 101, CO2 26, glucose 197, BUN 17, creatinine 0.94.  AST 54, ALT 14, total bilirubin 1.0.  Lactic acid 2.1>1.8.  COVID/flu/RSV PCR negative.  Urinalysis with trace leukocytes, negative nitrite, no bacteria, 11-20 WBCs.  Chest x-ray with no active cardiopulmonary disease process.  Blood cultures x 2 and urine culture obtained.  Patient was continued on IV Novox with cefazolin as previously prescribed.  Orthopedics was consulted who recommended medicine admission; and will follow patient while inpatient.  TRH consulted for admission.  Assessment & Plan:   Fever Recent MSSA bacteremia 2/2 left knee periprosthetic joint infection Patient presenting to the ED from SNF with fever reportedly 101.4 F.  Recent MSSA bacteremia secondary to left knee periprosthetic joint infection in which patient underwent I&D with poly exchange on 09/28/2023.  Was seen by  infectious disease during previous hospitalization and discharged on cefazolin via PICC line with projected end date 11/09/2023.  Chest x-ray unrevealing, urinalysis negative.  Patient does have significant erythema, swelling to left knee. -- Orthopedics following, appreciate assistance; reports this is typical postop changes and no further surgical invention required at this time -- Procalcitonin 0.20, reassuring -- Blood cultures x 2: No growth x 3 days -- Continue antibiotics with IV cefazolin 1 g IV every 8 hours -- Continue monitor fever curve, supportive care, antipyretics -- CBC daily  Right cephalic vein superficial venous thrombosis D-dimer elevated.  Vascular duplex ultrasound right upper extremity negative for DVT but findings consistent with acute superficial vein thrombosis.  Left lower extremity venous duplex ultrasound negative for DVT. --Supportive care  Persistent atrial fibrillation on anticoagulation Essential hypertension -- Metoprolol tartrate 12.5 mg p.o. twice daily -- Eliquis 5 mg p.o. twice daily -- Monitor on telemetry  Type 2 diabetes mellitus Hemoglobin A1c 6.7 on 09/08/2023, well-controlled.  Diet controlled at baseline. -- CBG qAM  History of recent TBI/SDH for mechanical fall Presenting from SNF -- PT/OT following with recommendations of return to SNF -- Fall precautions -- TOC consulted   DVT prophylaxis:  apixaban (ELIQUIS) tablet 5 mg    Code Status: Limited: Do not attempt resuscitation (DNR) -DNR-LIMITED -Do Not Intubate/DNI  Family Communication: No family present at bedside this morning  Disposition Plan:  Level of care: Med-Surg Status is: Observation The patient remains OBS appropriate and will d/c before 2 midnights.    Consultants:  Orthopedics  Procedures:  None  Antimicrobials:  Cefazolin   Subjective: Patient seen  PROGRESS NOTE    HAKIM MINNIEFIELD  EXB:[id number] DOB: 11-Feb-1936 DOA: 10/02/2023 PCP: [name], PA-C    Brief Narrative:   Mathew Simpson is a 87 y.o. 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 Lequita Halt on 10/7 who recently discharged to [facility] on 10/10 on IV antibiotics who presented to [facility] ED on 10/12 with report of fever.  Facility reports fever of 101.4.  Patient reports significant pain to the left knee, denies cough, no dysuria.  Has PICC line in place and has been receiving IV antibiotics from recent bacteremia/knee joint infection.  EMS administered Tylenol and 500 mL bolus and transported to the ED for further evaluation.  In the ED, temperature 98.7 F, HR 93, RR 22, BP 120/73, SpO2 98% on room air.  WBC 7.5, hemoglobin 9.1, platelet count 264.  Sodium 139, potassium 3.7, chloride 101, CO2 26, glucose 197, BUN 17, creatinine 0.94.  AST 54, ALT 14, total bilirubin 1.0.  Lactic acid 2.1>1.8.  COVID/flu/RSV PCR negative.  Urinalysis with trace leukocytes, negative nitrite, no bacteria, 11-20 WBCs.  Chest x-ray with no active cardiopulmonary disease process.  Blood cultures x 2 and urine culture obtained.  Patient was continued on IV Novox with cefazolin as previously prescribed.  Orthopedics was consulted who recommended medicine admission; and will follow patient while inpatient.  TRH consulted for admission.  Assessment & Plan:   Fever Recent MSSA bacteremia 2/2 left knee periprosthetic joint infection Patient presenting to the ED from SNF with fever reportedly 101.4 F.  Recent MSSA bacteremia secondary to left knee periprosthetic joint infection in which patient underwent I&D with poly exchange on 09/28/2023.  Was seen by  infectious disease during previous hospitalization and discharged on cefazolin via PICC line with projected end date 11/09/2023.  Chest x-ray unrevealing, urinalysis negative.  Patient does have significant erythema, swelling to left knee. -- Orthopedics following, appreciate assistance; reports this is typical postop changes and no further surgical invention required at this time -- Procalcitonin 0.20, reassuring -- Blood cultures x 2: No growth x 3 days -- Continue antibiotics with IV cefazolin 1 g IV every 8 hours -- Continue monitor fever curve, supportive care, antipyretics -- CBC daily  Right cephalic vein superficial venous thrombosis D-dimer elevated.  Vascular duplex ultrasound right upper extremity negative for DVT but findings consistent with acute superficial vein thrombosis.  Left lower extremity venous duplex ultrasound negative for DVT. --Supportive care  Persistent atrial fibrillation on anticoagulation Essential hypertension -- Metoprolol tartrate 12.5 mg p.o. twice daily -- Eliquis 5 mg p.o. twice daily -- Monitor on telemetry  Type 2 diabetes mellitus Hemoglobin A1c 6.7 on 09/08/2023, well-controlled.  Diet controlled at baseline. -- CBG qAM  History of recent TBI/SDH for mechanical fall Presenting from SNF -- PT/OT following with recommendations of return to SNF -- Fall precautions -- TOC consulted   DVT prophylaxis:  apixaban (ELIQUIS) tablet 5 mg    Code Status: Limited: Do not attempt resuscitation (DNR) -DNR-LIMITED -Do Not Intubate/DNI  Family Communication: No family present at bedside this morning  Disposition Plan:  Level of care: Med-Surg Status is: Observation The patient remains OBS appropriate and will d/c before 2 midnights.    Consultants:  Orthopedics  Procedures:  None  Antimicrobials:  Cefazolin   Subjective: Patient seen  with acute superficial vein thrombosis involving the right cephalic vein. However, unable to visualize the brachial veins. Portions of this exam were limited,please see tech comments.  *See table(s) above for measurements and observations.  Diagnosing physician: Carolynn Sayers Electronically signed by Carolynn Sayers on 10/04/2023 at 12:57:30 PM.    Final    VAS [place] LOWER EXTREMITY VENOUS (DVT)  Result Date: 10/04/2023  Lower Venous DVT Study Patient Name:  Mathew Simpson  Date of Exam:   10/04/2023 Medical Rec #: [id number]      Accession #:    [id number] Date of Birth: 1936/07/12       Patient Gender: M Patient Age:   33 years Exam Location:  [facility] Procedure:      VAS [place] LOWER EXTREMITY VENOUS (DVT) Referring Phys: Enio Hornback [place] --------------------------------------------------------------------------------  Indications: Pain, and Swelling. Other Indications: Septic left knee joint. Comparison Study: Previous exam 09/18/2019 was negative for DVT. Performing Technologist: Ernestene Mention RVT, RDMS  Examination Guidelines: A complete evaluation includes B-mode imaging, spectral Doppler, color Doppler, and power Doppler as needed of all accessible portions of each vessel. Bilateral testing is considered an integral part of a complete examination. Limited examinations for reoccurring indications may be performed as noted. The reflux portion of the exam is performed with the patient in reverse Trendelenburg.  +-----+---------------+---------+-----------+----------+--------------+ RIGHTCompressibilityPhasicitySpontaneityPropertiesThrombus Aging  +-----+---------------+---------+-----------+----------+--------------+ CFV  Full           Yes      Yes                                 +-----+---------------+---------+-----------+----------+--------------+   +---------+---------------+---------+-----------+----------+--------------+ LEFT     CompressibilityPhasicitySpontaneityPropertiesThrombus Aging +---------+---------------+---------+-----------+----------+--------------+ CFV      Full           Yes      Yes                                 +---------+---------------+---------+-----------+----------+--------------+ SFJ      Full                                                        +---------+---------------+---------+-----------+----------+--------------+ FV Prox  Full           Yes      Yes                                 +---------+---------------+---------+-----------+----------+--------------+ FV Mid   Full           Yes      Yes                                 +---------+---------------+---------+-----------+----------+--------------+ FV DistalFull           Yes      Yes                                 +---------+---------------+---------+-----------+----------+--------------+ PFV      Full                                                        +---------+---------------+---------+-----------+----------+--------------+  PROGRESS NOTE    HAKIM MINNIEFIELD  EXB:[id number] DOB: 11-Feb-1936 DOA: 10/02/2023 PCP: [name], PA-C    Brief Narrative:   Mathew Simpson is a 87 y.o. 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 Lequita Halt on 10/7 who recently discharged to [facility] on 10/10 on IV antibiotics who presented to [facility] ED on 10/12 with report of fever.  Facility reports fever of 101.4.  Patient reports significant pain to the left knee, denies cough, no dysuria.  Has PICC line in place and has been receiving IV antibiotics from recent bacteremia/knee joint infection.  EMS administered Tylenol and 500 mL bolus and transported to the ED for further evaluation.  In the ED, temperature 98.7 F, HR 93, RR 22, BP 120/73, SpO2 98% on room air.  WBC 7.5, hemoglobin 9.1, platelet count 264.  Sodium 139, potassium 3.7, chloride 101, CO2 26, glucose 197, BUN 17, creatinine 0.94.  AST 54, ALT 14, total bilirubin 1.0.  Lactic acid 2.1>1.8.  COVID/flu/RSV PCR negative.  Urinalysis with trace leukocytes, negative nitrite, no bacteria, 11-20 WBCs.  Chest x-ray with no active cardiopulmonary disease process.  Blood cultures x 2 and urine culture obtained.  Patient was continued on IV Novox with cefazolin as previously prescribed.  Orthopedics was consulted who recommended medicine admission; and will follow patient while inpatient.  TRH consulted for admission.  Assessment & Plan:   Fever Recent MSSA bacteremia 2/2 left knee periprosthetic joint infection Patient presenting to the ED from SNF with fever reportedly 101.4 F.  Recent MSSA bacteremia secondary to left knee periprosthetic joint infection in which patient underwent I&D with poly exchange on 09/28/2023.  Was seen by  infectious disease during previous hospitalization and discharged on cefazolin via PICC line with projected end date 11/09/2023.  Chest x-ray unrevealing, urinalysis negative.  Patient does have significant erythema, swelling to left knee. -- Orthopedics following, appreciate assistance; reports this is typical postop changes and no further surgical invention required at this time -- Procalcitonin 0.20, reassuring -- Blood cultures x 2: No growth x 3 days -- Continue antibiotics with IV cefazolin 1 g IV every 8 hours -- Continue monitor fever curve, supportive care, antipyretics -- CBC daily  Right cephalic vein superficial venous thrombosis D-dimer elevated.  Vascular duplex ultrasound right upper extremity negative for DVT but findings consistent with acute superficial vein thrombosis.  Left lower extremity venous duplex ultrasound negative for DVT. --Supportive care  Persistent atrial fibrillation on anticoagulation Essential hypertension -- Metoprolol tartrate 12.5 mg p.o. twice daily -- Eliquis 5 mg p.o. twice daily -- Monitor on telemetry  Type 2 diabetes mellitus Hemoglobin A1c 6.7 on 09/08/2023, well-controlled.  Diet controlled at baseline. -- CBG qAM  History of recent TBI/SDH for mechanical fall Presenting from SNF -- PT/OT following with recommendations of return to SNF -- Fall precautions -- TOC consulted   DVT prophylaxis:  apixaban (ELIQUIS) tablet 5 mg    Code Status: Limited: Do not attempt resuscitation (DNR) -DNR-LIMITED -Do Not Intubate/DNI  Family Communication: No family present at bedside this morning  Disposition Plan:  Level of care: Med-Surg Status is: Observation The patient remains OBS appropriate and will d/c before 2 midnights.    Consultants:  Orthopedics  Procedures:  None  Antimicrobials:  Cefazolin   Subjective: Patient seen

## 2023-10-06 DIAGNOSIS — Z1152 Encounter for screening for COVID-19: Secondary | ICD-10-CM | POA: Diagnosis not present

## 2023-10-06 DIAGNOSIS — Y792 Prosthetic and other implants, materials and accessory orthopedic devices associated with adverse incidents: Secondary | ICD-10-CM | POA: Diagnosis present

## 2023-10-06 DIAGNOSIS — E119 Type 2 diabetes mellitus without complications: Secondary | ICD-10-CM | POA: Diagnosis present

## 2023-10-06 DIAGNOSIS — T8454XA Infection and inflammatory reaction due to internal left knee prosthesis, initial encounter: Secondary | ICD-10-CM

## 2023-10-06 DIAGNOSIS — I4819 Other persistent atrial fibrillation: Secondary | ICD-10-CM | POA: Diagnosis present

## 2023-10-06 DIAGNOSIS — Z79899 Other long term (current) drug therapy: Secondary | ICD-10-CM | POA: Diagnosis not present

## 2023-10-06 DIAGNOSIS — T8454XD Infection and inflammatory reaction due to internal left knee prosthesis, subsequent encounter: Secondary | ICD-10-CM | POA: Diagnosis not present

## 2023-10-06 DIAGNOSIS — Z7901 Long term (current) use of anticoagulants: Secondary | ICD-10-CM | POA: Diagnosis not present

## 2023-10-06 DIAGNOSIS — H409 Unspecified glaucoma: Secondary | ICD-10-CM | POA: Diagnosis present

## 2023-10-06 DIAGNOSIS — Z87820 Personal history of traumatic brain injury: Secondary | ICD-10-CM | POA: Diagnosis not present

## 2023-10-06 DIAGNOSIS — M009 Pyogenic arthritis, unspecified: Secondary | ICD-10-CM | POA: Diagnosis not present

## 2023-10-06 DIAGNOSIS — R509 Fever, unspecified: Secondary | ICD-10-CM | POA: Diagnosis present

## 2023-10-06 DIAGNOSIS — E440 Moderate protein-calorie malnutrition: Secondary | ICD-10-CM | POA: Diagnosis present

## 2023-10-06 DIAGNOSIS — Z6827 Body mass index (BMI) 27.0-27.9, adult: Secondary | ICD-10-CM | POA: Diagnosis not present

## 2023-10-06 DIAGNOSIS — I82611 Acute embolism and thrombosis of superficial veins of right upper extremity: Secondary | ICD-10-CM | POA: Diagnosis present

## 2023-10-06 DIAGNOSIS — Z66 Do not resuscitate: Secondary | ICD-10-CM | POA: Diagnosis present

## 2023-10-06 DIAGNOSIS — T8484XA Pain due to internal orthopedic prosthetic devices, implants and grafts, initial encounter: Secondary | ICD-10-CM | POA: Diagnosis present

## 2023-10-06 DIAGNOSIS — I10 Essential (primary) hypertension: Secondary | ICD-10-CM | POA: Diagnosis present

## 2023-10-06 DIAGNOSIS — Z880 Allergy status to penicillin: Secondary | ICD-10-CM | POA: Diagnosis not present

## 2023-10-06 DIAGNOSIS — Z96653 Presence of artificial knee joint, bilateral: Secondary | ICD-10-CM | POA: Diagnosis present

## 2023-10-06 MED ORDER — MAGNESIUM HYDROXIDE 400 MG/5ML PO SUSP: 30.0000 mL | Freq: Every day | ORAL | Status: DC | PRN

## 2023-10-06 NOTE — Progress Notes (Addendum)
PROGRESS NOTE    Mathew Simpson  PIR:[id number] DOB: [birth year] DOA: 10/02/2023 PCP: [name], PA-C    Brief Narrative:   Mathew Simpson is a 87 y.o. 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 Lequita Halt on 10/7 who recently discharged to [facility] on 10/10 on IV antibiotics who presented to [facility] ED on 10/12 with report of fever.  Facility reports fever of 101.4.  Patient reports significant pain to the left knee, denies cough, no dysuria.  Has PICC line in place and has been receiving IV antibiotics from recent bacteremia/knee joint infection.  EMS administered Tylenol and 500 mL bolus and transported to the ED for further evaluation.  In the ED, temperature 98.7 F, HR 93, RR 22, BP 120/73, SpO2 98% on room air.  WBC 7.5, hemoglobin 9.1, platelet count 264.  Sodium 139, potassium 3.7, chloride 101, CO2 26, glucose 197, BUN 17, creatinine 0.94.  AST 54, ALT 14, total bilirubin 1.0.  Lactic acid 2.1>1.8.  COVID/flu/RSV PCR negative.  Urinalysis with trace leukocytes, negative nitrite, no bacteria, 11-20 WBCs.  Chest x-ray with no active cardiopulmonary disease process.  Blood cultures x 2 and urine culture obtained.  Patient was continued on IV Novox with cefazolin as previously prescribed.  Orthopedics was consulted who recommended medicine admission; and will follow patient while inpatient.  TRH consulted for admission.  Assessment & Plan:   Fever Recent MSSA bacteremia 2/2 left knee periprosthetic joint infection Patient presenting to the ED from SNF with fever reportedly 101.4 F.  Recent MSSA bacteremia secondary to left knee periprosthetic joint infection in which patient underwent I&D with poly exchange on 09/28/2023.  Was seen by  infectious disease during previous hospitalization and discharged on cefazolin via PICC line with projected end date 11/09/2023.  Chest x-ray unrevealing, urinalysis negative.  Patient does have significant erythema, swelling to left knee.  Underwent left knee arthrocentesis by orthopedics on 10/05/2023 with cell count 25K WBCs (44K on 10/2) and 98% neutrophils. -- Orthopedics following, appreciate assistance; reports this is typical postop changes and no further surgical invention required at this time -- Left knee synovial fluid culture: Abundant WBCs, no organisms on Gram stain, no growth <24h; further pending -- Procalcitonin 0.20, reassuring -- Blood cultures x 2: No growth x 4 days -- Continue antibiotics with IV cefazolin 1 g IV every 8 hours (End 11/18) -- Continue monitor fever curve, supportive care, antipyretics -- CBC in the am  Right cephalic vein superficial venous thrombosis D-dimer elevated.  Vascular duplex ultrasound right upper extremity negative for DVT but findings consistent with acute superficial vein thrombosis.  Left lower extremity venous duplex ultrasound negative for DVT. --Supportive care  Persistent atrial fibrillation on anticoagulation Essential hypertension -- Metoprolol tartrate 12.5 mg p.o. twice daily -- Eliquis 5 mg p.o. twice daily -- Monitor on telemetry  Type 2 diabetes mellitus Hemoglobin A1c 6.7 on 09/08/2023, well-controlled.  Diet controlled at baseline. -- CBG qAM  History of recent TBI/SDH for mechanical fall Presenting from SNF -- PT/OT following with recommendations of return to SNF -- Fall precautions -- TOC consulted   DVT prophylaxis:  apixaban (ELIQUIS) tablet 5 mg    Code Status: Limited: Do not attempt resuscitation (DNR) -DNR-LIMITED -Do Not Intubate/DNI  Family Communication: No family present at bedside this  morning  Disposition Plan:  Level of care: Med-Surg Status is: Observation The patient will require care spanning > 2  midnights and should be moved to inpatient because: Continued workup for fever, MSSA bacteremia, prosthetic left knee joint infection in which he received repeat arthrocentesis by orthopedics yesterday    Consultants:  Orthopedics, Dr. Despina Hick   Procedures:  None  Antimicrobials:  Cefazolin   Subjective: Patient seen examined bedside, resting calmly.  Lying in bed.  Patient reports pain improved with both active/passive range of motion, seen by orthopedics once again underwent left knee arthrocentesis with cultures pending.  Remains on IV antibiotics with cefazolin. No other specific complaints or concerns at this time.  Denies headache, no visual changes, no chest pain, no palpitations, no shortness of breath, no abdominal pain, no current fever, no chills/night sweats, no nausea/vomiting/diarrhea, no focal weakness, no fatigue, no cough/congestion, no paresthesias.  No acute events overnight per nursing staff.  Seen by PT/OT yesterday with recommendation to return to SNF.  TOC consulted for assistance, will need new insurance authorization.  Also following recent arthrocentesis and synovial fluid culture with high WBC count although improved from previous, awaiting further orthopedic recommendations.  Objective: Vitals:   10/05/23 1351 10/05/23 2145 10/06/23 0415 10/06/23 0733  BP: 132/68 121/74 124/78 123/64  Pulse: 90 89 98 62  Resp: 18 16  18   Temp: 98.6 F (37 C) 98.2 F (36.8 C) 98.5 F (36.9 C) 98.2 F (36.8 C)  TempSrc: Oral Oral Oral Oral  SpO2: 96% 100% 93% 96%  Weight:      Height:        Intake/Output Summary (Last 24 hours) at 10/06/2023 1147 Last data filed at 10/06/2023 0442 Gross per 24 hour  Intake 200 ml  Output 1000 ml  Net -800 ml   Filed Weights   10/02/23 1905  Weight: 86.2 kg    Examination:  Physical Exam: GEN: NAD, alert and oriented x 3, elderly/chronically ill in appearance HEENT: NCAT, PERRL, EOMI, sclera clear, MMM, poor dentition, noted  posterior scalp scar PULM: CTAB w/o wheezes/crackles, normal respiratory effort, on room air CV: RRR w/o M/G/R GI: abd soft, NTND, NABS, no R/G/M MSK: 2-3 + LLE peripheral edema up to thigh, noted edematous left knee with erythema, TTP, + pain w/ AROM/PROM NEURO: No focal neurological deficits PSYCH: normal mood/affect Integumentary: Left lower extremity with edema, erythema, TTP warm to touch, otherwise no other concerning rashes/lesions/wounds nonexposed skin surfaces       Data Reviewed: I have personally reviewed following labs and imaging studies  CBC: Recent Labs  Lab 09/30/23 0437 10/02/23 1930 10/03/23 0030 10/03/23 0426 10/04/23 0628  WBC 10.2 7.5 6.1 6.9 7.3  NEUTROABS  --  5.9  --   --   --   HGB 9.6* 9.1* 7.9* 9.2* 8.8*  HCT 28.8* 28.2* 24.8* 28.5* 27.3*  MCV 93.5 93.4 96.5 94.7 92.2  PLT 216 264 216 255 246   Basic Metabolic Panel: Recent Labs  Lab 09/30/23 0437 10/02/23 1930 10/03/23 0030 10/03/23 0426 10/04/23 0628  NA 141 139  --  140 138  K 3.1* 3.7  --  3.8 3.7  CL 103 101  --  102 98  CO2 29 26  --  26 28  GLUCOSE 132* 197*  --  145* 151*  BUN 21 17  --  15 14  CREATININE 1.09 0.94 1.31* 1.11 0.98  CALCIUM 7.6* 7.7*  --  7.8* 8.0*  MG 2.1  --   --   --   --  GFR: Estimated Creatinine Clearance: 56.9 mL/min (by C-G formula based on SCr of 0.98 mg/dL). Liver Function Tests: Recent Labs  Lab 10/02/23 1930  AST 54*  ALT 14  ALKPHOS 113  BILITOT 1.0  PROT 5.7*  ALBUMIN 1.8*   No results for input(s): "LIPASE", "AMYLASE" in the last 168 hours. No results for input(s): "AMMONIA" in the last 168 hours. Coagulation Profile: Recent Labs  Lab 10/02/23 1930  INR 3.4*   Cardiac Enzymes: No results for input(s): "CKTOTAL", "CKMB", "CKMBINDEX", "TROPONINI" in the last 168 hours. BNP (last 3 results) No results for input(s): "PROBNP" in the last 8760 hours. HbA1C: No results for input(s): "HGBA1C" in the last 72 hours. CBG: Recent Labs   Lab 09/30/23 1045 09/30/23 1639 09/30/23 2128 10/01/23 0609 10/01/23 1118  GLUCAP 259* 198* 139* 141* 217*   Lipid Profile: No results for input(s): "CHOL", "HDL", "LDLCALC", "TRIG", "CHOLHDL", "LDLDIRECT" in the last 72 hours. Thyroid Function Tests: No results for input(s): "TSH", "T4TOTAL", "FREET4", "T3FREE", "THYROIDAB" in the last 72 hours. Anemia Panel: No results for input(s): "VITAMINB12", "FOLATE", "FERRITIN", "TIBC", "IRON", "RETICCTPCT" in the last 72 hours. Sepsis Labs: Recent Labs  Lab 10/02/23 1948 10/02/23 2207 10/03/23 0426 10/04/23 0628  PROCALCITON  --   --  0.20 0.26  LATICACIDVEN 2.1* 1.8  --  1.1    Recent Results (from the past 240 hour(s))  Aerobic/Anaerobic Culture w Gram Stain (surgical/deep wound)     Status: None   Collection Time: 09/28/23  5:00 PM   Specimen: Path fluid; Body Fluid  Result Value Ref Range Status   Specimen Description FLUID  Final   Special Requests left knee infection  Final   Gram Stain   Final    RARE WBC PRESENT, PREDOMINANTLY PMN NO ORGANISMS SEEN    Culture   Final    RARE STAPHYLOCOCCUS AUREUS NO ANAEROBES ISOLATED Performed at [facility], [address], [place], [place] [address]    Report Status 10/03/2023 FINAL  Final   Organism ID, Bacteria STAPHYLOCOCCUS AUREUS  Final      Susceptibility   Staphylococcus aureus - MIC*    CIPROFLOXACIN <=0.5 SENSITIVE Sensitive     ERYTHROMYCIN <=0.25 SENSITIVE Sensitive     GENTAMICIN <=0.5 SENSITIVE Sensitive     OXACILLIN 0.5 SENSITIVE Sensitive     TETRACYCLINE <=1 SENSITIVE Sensitive     VANCOMYCIN 1 SENSITIVE Sensitive     TRIMETH/SULFA <=10 SENSITIVE Sensitive     CLINDAMYCIN <=0.25 SENSITIVE Sensitive     RIFAMPIN <=0.5 SENSITIVE Sensitive     Inducible Clindamycin NEGATIVE Sensitive     LINEZOLID 2 SENSITIVE Sensitive     * RARE STAPHYLOCOCCUS AUREUS  Resp panel by RT-PCR (RSV, Flu A&B, Covid) Anterior Nasal Swab     Status: None   Collection Time:  10/02/23  7:25 PM   Specimen: Anterior Nasal Swab  Result Value Ref Range Status   SARS Coronavirus 2 by RT PCR NEGATIVE NEGATIVE Final   Influenza A by PCR NEGATIVE NEGATIVE Final   Influenza B by PCR NEGATIVE NEGATIVE Final    Comment: (NOTE) The Xpert Xpress SARS-CoV-2/FLU/RSV plus assay is intended as an aid in the diagnosis of influenza from Nasopharyngeal swab specimens and should not be used as a sole basis for treatment. Nasal washings and aspirates are unacceptable for Xpert Xpress SARS-CoV-2/FLU/RSV testing.  Fact Sheet for Patients: BloggerCourse.com  Fact Sheet for Healthcare Providers: SeriousBroker.it  This test is not yet approved or cleared by the [place]  FDA and has been authorized for detection and/or diagnosis of SARS-CoV-2 by FDA under an Emergency Use Authorization (EUA). This EUA will remain in effect (meaning this test can be used) for the duration of the COVID-19 declaration under Section 564(b)(1) of the Act, 21 U.S.C. section 360bbb-3(b)(1), unless the authorization is terminated or revoked.     Resp Syncytial Virus by PCR NEGATIVE NEGATIVE Final    Comment: (NOTE) Fact Sheet for Patients: BloggerCourse.com  Fact Sheet for Healthcare Providers: SeriousBroker.it  This test is not yet approved or cleared by the [place] FDA and has been authorized for detection and/or diagnosis of SARS-CoV-2 by FDA under an Emergency Use Authorization (EUA). This EUA will remain in effect (meaning this test can be used) for the duration of the COVID-19 declaration under Section 564(b)(1) of the Act, 21 U.S.C. section 360bbb-3(b)(1), unless the authorization is terminated or revoked.  Performed at [facility], [address] [place], [place] [address]   Blood Culture (routine x 2)     Status: None (Preliminary result)   Collection Time: 10/02/23   7:25 PM   Specimen: BLOOD  Result Value Ref Range Status   Specimen Description BLOOD LEFT ANTECUBITAL  Final   Special Requests   Final    BOTTLES DRAWN AEROBIC AND ANAEROBIC Blood Culture adequate volume   Culture   Final    NO GROWTH 4 DAYS Performed at [facility], [address], [place], [place] [address]    Report Status PENDING  Incomplete  Blood Culture (routine x 2)     Status: None (Preliminary result)   Collection Time: 10/02/23  7:32 PM   Specimen: BLOOD  Result Value Ref Range Status   Specimen Description BLOOD RIGHT ANTECUBITAL  Final   Special Requests   Final    BOTTLES DRAWN AEROBIC AND ANAEROBIC Blood Culture results may not be optimal due to an inadequate volume of blood received in culture bottles   Culture   Final    NO GROWTH 4 DAYS Performed at [facility], [address], [place], [place] [address]    Report Status PENDING  Incomplete  Urine Culture     Status: Abnormal   Collection Time: 10/02/23 10:27 PM   Specimen: Urine, Random  Result Value Ref Range Status   Specimen Description URINE, RANDOM  Final   Special Requests NONE Reflexed from [id number]  Final   Culture (A)  Final    <10,000 COLONIES/mL INSIGNIFICANT GROWTH Performed at [facility], [address], [place], [place] [address]    Report Status 10/04/2023 FINAL  Final  Body fluid culture w Gram Stain     Status: None (Preliminary result)   Collection Time: 10/05/23  5:16 PM   Specimen: Synovium; Body Fluid  Result Value Ref Range Status   Specimen Description SYNOVIAL  Final   Special Requests LEFT KNEE  Final   Gram Stain   Final    ABUNDANT WBC PRESENT, PREDOMINANTLY PMN NO ORGANISMS SEEN    Culture   Final    NO GROWTH < 24 HOURS Performed at [facility], [address], [place], [place] [address]    Report Status PENDING  Incomplete         Radiology Studies: No results found.      Scheduled Meds:  apixaban  5 mg Oral BID    Chlorhexidine Gluconate Cloth  6 each Topical Q0600   diltiazem  120 mg Oral Daily  docusate sodium  100 mg Oral BID   Gerhardt's butt cream  1 Application Topical TID   metoprolol tartrate  12.5 mg Oral BID   multivitamin with minerals  1 tablet Oral Q1400   Continuous Infusions:   ceFAZolin (ANCEF) IV 2 g (10/06/23 0516)     LOS: 0 days    Time spent: 52 minutes spent on chart review, discussion with nursing staff, consultants, updating family and interview/physical exam; more than 50% of that time was spent in counseling and/or coordination of care.    Alvira Philips [place], DO [facility] Available via Epic secure chat 7am-7pm After these hours, please refer to coverage provider listed on [email or web address] 10/06/2023, 11:47 AM

## 2023-10-06 NOTE — Plan of Care (Signed)
Problem: Education: Goal: Knowledge of the prescribed therapeutic regimen will improve 10/06/2023 0249 by Shya Kovatch, Ros[place] Nickel, RN Outcome: Progressing 10/06/2023 0248 by Iretta Mangrum, [place] Nickel, RN Outcome: Progressing Goal: Individualized Educational Video(s) 10/06/2023 0249 by Huma Imhoff, [place] Nickel, RN Outcome: Progressing 10/06/2023 0248 by Aamori Mcmasters, [place]well Nickel, RN Outcome: Progressing   Problem: Activity: Goal: Ability to avoid complications of mobility impairment will improve 10/06/2023 0249 by Ardean Melroy, Roswell Nickel, RN Outcome: Progressing 10/06/2023 0248 by Nissim Fleischer, [place] Nickel, RN Outcome: Progressing Goal: Range of joint motion will improve 10/06/2023 0249 by Sonia Bromell, [place] Nickel, RN Outcome: Progressing 10/06/2023 0248 by Willett Lefeber, [place] Nickel, RN Outcome: Progressing   Problem: Clinical Measurements: Goal: Postoperative complications will be avoided or minimized 10/06/2023 0249 by Aubra Pappalardo, Roswell Nickel, RN Outcome: Progressing 10/06/2023 0248 by Muskan Bolla, Roswell Nickel, RN Outcome: Progressing   Problem: Pain Management: Goal: Pain level will decrease with appropriate interventions 10/06/2023 0249 by Telly Jawad, Roswell Nickel, RN Outcome: Progressing 10/06/2023 0248 by Rafi Kenneth, [place]well Nickel, RN Outcome: Progressing   Problem: Skin Integrity: Goal: Will show signs of wound healing 10/06/2023 0249 by Whittney Steenson, Roswell Nickel, RN Outcome: Progressing 10/06/2023 0248 by Malikiah Debarr, Roswell Nickel, RN Outcome: Progressing   Problem: Education: Goal: Knowledge of General Education information will improve Description: Including pain rating scale, medication(s)/side effects and non-pharmacologic comfort measures 10/06/2023 0249 by Dewayne Jurek, Roswell Nickel, RN Outcome: Progressing 10/06/2023 0248 by Kizer Nobbe, Roswell Nickel, RN Outcome: Progressing   Problem: Health Behavior/Discharge Planning: Goal: Ability to manage health-related needs will improve 10/06/2023 0249 by Graceann Boileau, Roswell Nickel, RN Outcome: Progressing 10/06/2023 0248 by Harvir Patry, Roswell Nickel,  RN Outcome: Progressing   Problem: Clinical Measurements: Goal: Ability to maintain clinical measurements within normal limits will improve 10/06/2023 0249 by Lenardo Westwood, Roswell Nickel, RN Outcome: Progressing 10/06/2023 0248 by Crystle Carelli, [place] Nickel, RN Outcome: Progressing Goal: Will remain free from infection 10/06/2023 0249 by Antoinette Borgwardt, Roswell Nickel, RN Outcome: Progressing 10/06/2023 0248 by Christie Viscomi, [place]well Nickel, RN Outcome: Progressing Goal: Diagnostic test results will improve 10/06/2023 0249 by Caliber Landess, Roswell Nickel, RN Outcome: Progressing 10/06/2023 0248 by Aeliana Spates, [place] Nickel, RN Outcome: Progressing Goal: Respiratory complications will improve 10/06/2023 0249 by Dameka Younker, Roswell Nickel, RN Outcome: Progressing 10/06/2023 0248 by Celie Desrochers, [place] Nickel, RN Outcome: Progressing Goal: Cardiovascular complication will be avoided 10/06/2023 0249 by Vickie Ponds, Roswell Nickel, RN Outcome: Progressing 10/06/2023 0248 by Sian Rockers, Roswell Nickel, RN Outcome: Progressing   Problem: Activity: Goal: Risk for activity intolerance will decrease 10/06/2023 0249 by Debra Colon, Roswell Nickel, RN Outcome: Progressing 10/06/2023 0248 by Aime Carreras, Roswell Nickel, RN Outcome: Progressing   Problem: Nutrition: Goal: Adequate nutrition will be maintained 10/06/2023 0249 by Jahmari Esbenshade, Roswell Nickel, RN Outcome: Progressing 10/06/2023 0248 by Jaiana Sheffer, [place]well Nickel, RN Outcome: Progressing   Problem: Coping: Goal: Level of anxiety will decrease 10/06/2023 0249 by Madinah Quarry, Roswell Nickel, RN Outcome: Progressing 10/06/2023 0248 by Ayinde Swim, Roswell Nickel, RN Outcome: Progressing   Problem: Elimination: Goal: Will not experience complications related to bowel motility 10/06/2023 0249 by Dejanira Pamintuan, Roswell Nickel, RN Outcome: Progressing 10/06/2023 0248 by Flemon Kelty, [place]well Nickel, RN Outcome: Progressing Goal: Will not experience complications related to urinary retention 10/06/2023 0249 by Laurin Morgenstern, Roswell Nickel, RN Outcome: Progressing 10/06/2023 0248 by Scottlyn Mchaney, Roswell Nickel, RN Outcome:  Progressing   Problem: Pain Managment: Goal: General experience of comfort will improve 10/06/2023 0249 by Fausto Sampedro, Roswell Nickel, RN Outcome: Progressing 10/06/2023 0248 by Kameela Leipold, [place]well Nickel, RN Outcome: Progressing   Problem: Safety: Goal: Ability to remain free from injury will improve 10/06/2023 0249 by Isra Lindy, Roswell Nickel, RN Outcome: Progressing 10/06/2023  1610 by Kawena Lyday, Roswell Nickel, RN Outcome: Progressing   Problem: Skin Integrity: Goal: Risk for impaired skin integrity will decrease 10/06/2023 0249 by Jiraiya Mcewan, [place]well Nickel, RN Outcome: Progressing 10/06/2023 0248 by Mancel Lardizabal, [place]well Nickel, RN Outcome: Progressing

## 2023-10-06 NOTE — Progress Notes (Signed)
Physical Therapy Treatment Patient Details Name: Mathew Simpson MRN: [id number] DOB: [birth year] Today's Date: 10/06/2023   History of Present Illness 87 y.o. male who presents to [facility] 10/11 with a fever. Recently discharged to [facility] on 10/10 after being admitted for over a month after a fall with ICH on 9/17. 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, recent MSSA bacteremia from periprosthetic left knee joint infection s/p I&D    PT Comments  Continuing work on functional mobility and activity tolerance;  Session focused on functional transfers, with notable improvement in RLE weight acceptance, standing posture, standing tolerance; opted to use the stedy to decr pt's fear of falling forward; Pt initailly very hesitant to work, however warmed up to PT, and seemed pleased with his ability to stand x2 today; Patient will benefit from continued inpatient follow up therapy, <3 hours/day    If plan is discharge home, recommend the following: Two people to help with walking and/or transfers;Two people to help with bathing/dressing/bathroom;Assist for transportation;Help with stairs or ramp for entrance;Assistance with cooking/housework   Can travel by private vehicle     No  Equipment Recommendations  Other (comment) (defer to post acute)    Recommendations for Other Services       Precautions / Restrictions Precautions Precautions: Fall Restrictions Weight Bearing Restrictions: No LLE Weight Bearing: Weight bearing as tolerated     Mobility  Bed Mobility Overal bed mobility: Needs Assistance Bed Mobility: Supine to Sit, Sit to Supine     Supine to sit: Max assist, +2 for physical assistance, Used rails Sit to supine: Mod assist   General bed mobility comments: assist to manage BLE and trunk, assist to scoot to EOB;  Noting good effort with laying down towards his pillow and initiated bil LEs  lifting to bed (assist to help RLE fully onto bed)    Transfers Overall transfer level: Needs assistance Equipment used: Ambulation equipment used Transfers: Sit to/from Stand Sit to Stand: Max assist, Mod assist           General transfer comment: Max of 2 to stand from bed; Overall good weight acceptance RLE; incr time to fully extend both knees and hips; Mod assist to stand from higher stedy seat Transfer via Lift Equipment: Stedy  Ambulation/Gait                   Stairs             Wheelchair Mobility     Tilt Bed    Modified Rankin (Stroke Patients Only)       Balance     Sitting balance-Leahy Scale: Poor       Standing balance-Leahy Scale: Poor                              Cognition Arousal: Alert (initially sleepy; eyes closed approx 1/3 of session, but pt answering questions without significant pause) Behavior During Therapy: WFL for tasks assessed/performed Overall Cognitive Status: Impaired/Different from baseline                                 General Comments: Lots of encouragement to participate; fearfull of falling; but seemed pleased to have been able to stand        Exercises      General Comments  Pertinent Vitals/Pain Pain Assessment Pain Assessment: Faces Faces Pain Scale: Hurts whole lot Pain Location: L knee; 8 at it's worst, and noted incr grimace with flexion Pain Descriptors / Indicators: Grimacing, Guarding, Aching Pain Intervention(s): Monitored during session, Repositioned    Home Living                          Prior Function            PT Goals (current goals can now be found in the care plan section) Acute Rehab PT Goals Patient Stated Goal: Seemed mildly pleased that he could stand x2 this session; "but it hurt!" PT Goal Formulation: With patient Time For Goal Achievement: 10/18/23 Potential to Achieve Goals: Fair Progress towards PT goals:  Progressing toward goals    Frequency    Min 1X/week      PT Plan      Co-evaluation              AM-PAC PT "6 Clicks" Mobility   Outcome Measure  Help needed turning from your back to your side while in a flat bed without using bedrails?: A Lot Help needed moving from lying on your back to sitting on the side of a flat bed without using bedrails?: Total Help needed moving to and from a bed to a chair (including a wheelchair)?: Total Help needed standing up from a chair using your arms (e.g., wheelchair or bedside chair)?: Total Help needed to walk in hospital room?: Total Help needed climbing 3-5 steps with a railing? : Total 6 Click Score: 7    End of Session Equipment Utilized During Treatment: Gait belt Activity Tolerance: Patient tolerated treatment well Patient left: in bed;with call bell/phone within reach;with bed alarm set Nurse Communication: Mobility status PT Visit Diagnosis: Other abnormalities of gait and mobility (R26.89);Muscle weakness (generalized) (M62.81);Pain BPPV - Right/Left : Right Pain - Right/Left: Left Pain - part of body: Knee     Time: 1040-1100 PT Time Calculation (min) (ACUTE ONLY): 20 min  Charges:    $Therapeutic Activity: 8-22 mins PT General Charges $$ ACUTE PT VISIT: 1 Visit                     Van Clines, PT  Acute Rehabilitation Services Office [phone] Secure Chat welcomed    [name] 10/06/2023, 1:15 PM

## 2023-10-06 NOTE — Plan of Care (Signed)
Pt remains with chronic back pain, remain incontinent with skin breakdown. Wound consult ordered.

## 2023-10-07 DIAGNOSIS — M009 Pyogenic arthritis, unspecified: Secondary | ICD-10-CM | POA: Diagnosis not present

## 2023-10-07 DIAGNOSIS — R509 Fever, unspecified: Secondary | ICD-10-CM

## 2023-10-07 LAB — CULTURE, BLOOD (ROUTINE X 2)
Culture: NO GROWTH
Culture: NO GROWTH
Special Requests: ADEQUATE

## 2023-10-07 LAB — BASIC METABOLIC PANEL
Anion gap: 9 (ref 5–15)
BUN: 22 mg/dL (ref 8–23)
CO2: 27 mmol/L (ref 22–32)
Calcium: 7.7 mg/dL — ABNORMAL LOW (ref 8.9–10.3)
Chloride: 97 mmol/L — ABNORMAL LOW (ref 98–111)
Creatinine, Ser: 0.98 mg/dL (ref 0.61–1.24)
GFR, Estimated: >60 mL/min (ref >60)
Glucose, Bld: 121 mg/dL — ABNORMAL HIGH (ref 70–99)
Potassium: 3.3 mmol/L — ABNORMAL LOW (ref 3.5–5.1)
Sodium: 133 mmol/L — ABNORMAL LOW (ref 135–145)

## 2023-10-07 LAB — CBC
HCT: 26 % — ABNORMAL LOW (ref 39.0–52.0)
Hemoglobin: 8.7 g/dL — ABNORMAL LOW (ref 13.0–17.0)
MCH: 30.3 pg (ref 26.0–34.0)
MCHC: 33.5 g/dL (ref 30.0–36.0)
MCV: 90.6 fL (ref 80.0–100.0)
Platelets: 245 10*3/uL (ref 150–400)
RBC: 2.87 MIL/uL — ABNORMAL LOW (ref 4.22–5.81)
RDW: 13.9 % (ref 11.5–15.5)
WBC: 6.2 10*3/uL (ref 4.0–10.5)
nRBC: 0 % (ref 0.0–0.2)

## 2023-10-07 MED ORDER — POTASSIUM CHLORIDE CRYS ER 20 MEQ PO TBCR
40.0000 meq | EXTENDED_RELEASE_TABLET | Freq: Once | ORAL | Status: AC
  Administered 2023-10-07: 40 meq via ORAL
  Filled 2023-10-07: qty 2

## 2023-10-07 MED ORDER — GERHARDT'S BUTT CREAM
1.0000 | TOPICAL_CREAM | Freq: Two times a day (BID) | CUTANEOUS | Status: DC
  Administered 2023-10-07 – 2023-10-08 (×3): 1 via TOPICAL
  Filled 2023-10-07: qty 1

## 2023-10-07 MED ORDER — MEDIHONEY WOUND/BURN DRESSING EX PSTE
1.0000 | PASTE | Freq: Every day | CUTANEOUS | Status: DC
  Administered 2023-10-07 – 2023-10-08 (×2): 1 via TOPICAL
  Filled 2023-10-07: qty 44

## 2023-10-07 NOTE — Progress Notes (Signed)
OT Cancellation Note  Patient Details Name: Mathew Simpson MRN: [id number] DOB: Jul 22, 1936   Cancelled Treatment:    Reason Eval/Treat Not Completed: (P) Fatigue/lethargy limiting ability to participate, Pt heavily sleeping upon arrival, woke up 2X for a few seconds, will reattempt later  Alexis Goodell 10/07/2023, 12:05 PM

## 2023-10-07 NOTE — TOC Progression Note (Signed)
Transition of Care [facility]) - Progression Note    Patient Details  Name: Mathew Simpson MRN: [id number] Date of Birth: 1936-11-20  Transition of Care [facility]) CM/SW Contact  Lorri Frederick, LCSW Phone Number: 10/07/2023, 8:40 AM  Clinical Narrative:   SNF auth approved: [id number], [id number], 3 days: 10/15-10/17.      Expected Discharge Plan: Skilled Nursing Facility Barriers to Discharge: Continued Medical Work up  Expected Discharge Plan and Services In-house Referral: Clinical Social Work   Post Acute Care Choice: Skilled Nursing Facility Living arrangements for the past 2 months: Single Family Home                                       Social Determinants of Health (SDOH) Interventions SDOH Screenings   Food Insecurity: No Food Insecurity (09/10/2023)  Housing: Low Risk  (09/10/2023)  Transportation Needs: No Transportation Needs (09/10/2023)  Utilities: Not At Risk (09/10/2023)  Tobacco Use: Medium Risk (10/02/2023)    Readmission Risk Interventions    07/23/2022   11:40 AM 07/21/2022   10:11 AM  Readmission Risk Prevention Plan  Post Dischage Appt Complete Complete  Medication Screening Complete Complete  Transportation Screening Complete Complete

## 2023-10-07 NOTE — Progress Notes (Signed)
Occupational Therapy Treatment Patient Details Name: Mathew Simpson: [id number] DOB: 1936/02/24 Today's Date: 10/07/2023   History of present illness 87 y.o. male who presents to [facility] 10/11 with a fever. Recently discharged to [facility] on 10/10 after being admitted for over a month after a fall with ICH on 9/17. 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 Pt became more alert but was unable to fully participate in therapy, refused OOB activities. Max verbal cueing for bed mobility, feeding, Pt had difficulty following commands, reduced clarity of speech, not aware of situation, nursing present throughout session to assist. Pt assisted with scooting back in bed and assisted with feeding to initiate task and position correctly. Pt would benefit from continued skilled therapy to maximize participation and improve OOB activities. DC to postacute still appropriate.       If plan is discharge home, recommend the following:  A lot of help with walking and/or transfers;Two people to help with walking and/or transfers;Two people to help with bathing/dressing/bathroom;A lot of help with bathing/dressing/bathroom;Assistance with cooking/housework;Assist for transportation   Equipment Recommendations  Other (comment)    Recommendations for Other Services      Precautions / Restrictions Precautions Precautions: Fall Precaution Comments: likely BPPV component Restrictions Weight Bearing Restrictions: Yes LLE Weight Bearing: Weight bearing as tolerated Other Position/Activity Restrictions: WBAT L LE       Mobility Bed Mobility Overal bed mobility: Needs Assistance             General bed mobility comments: Pt  max A for scooting back in bed, max verbal cueing    Transfers Overall transfer level: Needs assistance                 General transfer comment: did not attempt, Pt refused     Balance                                           ADL either performed or assessed with clinical judgement   ADL Overall ADL's : Needs assistance/impaired Eating/Feeding: Moderate assistance;Bed level                                     General ADL Comments: Not able to complete much today, confused, difficulty following commands, lethargic, mod A for feeding at beginning for intiation and follow through of task.    Extremity/Trunk Assessment Upper Extremity Assessment Upper Extremity Assessment: Difficult to assess due to impaired cognition            Vision       Perception     Praxis      Cognition Arousal: Lethargic Behavior During Therapy: WFL for tasks assessed/performed Overall Cognitive Status: Impaired/Different from baseline Area of Impairment: Orientation, Following commands, Safety/judgement, Awareness, Problem solving                 Orientation Level: Disoriented to, Situation, Time     Following Commands: Follows one step commands inconsistently, Follows one step commands with increased time Safety/Judgement: Decreased awareness of safety,  Decreased awareness of deficits Awareness: Intellectual Problem Solving: Slow processing, Decreased initiation, Difficulty sequencing, Requires verbal cues General Comments: Pt required multiple attempts throughout day to wake up, difficulty responding appropriately, not oriented to situation, difficulty following simple commands.        Exercises      Shoulder Instructions       General Comments      Pertinent Vitals/ Pain       Pain Assessment Pain Assessment: Faces Faces Pain Scale: Hurts a little bit Pain Location: L knee; 8 at it's worst, and noted incr grimace with  flexion Pain Descriptors / Indicators: Grimacing, Guarding, Aching Pain Intervention(s): Monitored during session  Home Living                                          Prior Functioning/Environment              Frequency  Min 1X/week        Progress Toward Goals  OT Goals(current goals can now be found in the care plan section)  Progress towards OT goals: Progressing toward goals  Acute Rehab OT Goals Patient Stated Goal: not able to participate in goal setting OT Goal Formulation: Patient unable to participate in goal setting Time For Goal Achievement: 10/18/23 Potential to Achieve Goals: Good ADL Goals Pt Will Perform Grooming: with modified independence;sitting Pt Will Perform Lower Body Bathing: with min assist;sitting/lateral leans;bed level Pt Will Perform Lower Body Dressing: with min assist;sitting/lateral leans;bed level Pt Will Transfer to Toilet: with min assist;squat pivot transfer;stand pivot transfer;bedside commode Pt Will Perform Toileting - Clothing Manipulation and hygiene: with min assist;sitting/lateral leans Additional ADL Goal #1: pt will perform bed mobility with CGA in prep for ADLs  Plan      Co-evaluation                 AM-PAC OT "6 Clicks" Daily Activity     Outcome Measure   Help from another person eating meals?: A Lot Help from another person taking care of personal grooming?: A Little Help from another person toileting, which includes using toliet, bedpan, or urinal?: Total Help from another person bathing (including washing, rinsing, drying)?: A Lot Help from another person to put on and taking off regular upper body clothing?: A Little Help from another person to put on and taking off regular lower body clothing?: Total 6 Click Score: 12    End of Session    OT Visit Diagnosis: Muscle weakness (generalized) (M62.81);Pain Pain - Right/Left: Left Pain - part of body: Knee   Activity Tolerance Patient  limited by lethargy   Patient Left in bed;with call bell/phone within reach   Nurse Communication Mobility status        Time: 1610-9604 OT Time Calculation (min): 15 min  Charges: OT General Charges $OT Visit: 1 Visit OT Treatments $Self Care/Home Management : 8-22 mins  [place], OTR/L   Alexis Goodell 10/07/2023, 5:06 PM

## 2023-10-07 NOTE — Progress Notes (Signed)
Subjective: Patient resting comfortably in bed. Easily awakened. Reports no new issues with his knee   Objective: Vital signs in last 24 hours: Temp:  [98.6 F (37 C)-99.2 F (37.3 C)] 99.2 F (37.3 C) (10/16 1219) Pulse Rate:  [73-84] 84 (10/16 1219) Resp:  [17-18] 18 (10/16 1219) BP: (119-139)/(63-85) 139/85 (10/16 1219) SpO2:  [95 %-99 %] 95 % (10/16 1219)  Intake/Output from previous day: No intake/output data recorded. Intake/Output this shift: Total I/O In: 700 [IV Piggyback:700] Out: -   Recent Labs    10/07/23 0642  HGB 8.7*   Recent Labs    10/07/23 0642  WBC 6.2  RBC 2.87*  HCT 26.0*  PLT 245   Recent Labs    10/07/23 0642  NA 133*  K 3.3*  CL 97*  CO2 27  BUN 22  CREATININE 0.98  GLUCOSE 121*  CALCIUM 7.7*   No results for input(s): "LABPT", "INR" in the last 72 hours.  EXAM His left knee has decreased swelling compared to 2 days ago when I aspirated it. He has a fair amount of bruising medially but the knee is not erythematous. Cultures remain negative at 48 hours No need for further surgical intervention   Assessment/Plan: Left knee Irrigation and Debridement- Knee does not show evidence of acute re-infection. Continue antibiotics. No need for further surgical intervention at this time     Mathew Simpson 10/07/2023, 5:08 PM

## 2023-10-07 NOTE — Plan of Care (Signed)
Problem: Education: Goal: Knowledge of the prescribed therapeutic regimen will improve Outcome: Progressing   Problem: Nutrition: Goal: Adequate nutrition will be maintained Outcome: Progressing

## 2023-10-07 NOTE — Progress Notes (Signed)
Progress Note   Patient: Mathew Simpson WUJ:[id number] DOB: 1936/02/07 DOA: 10/02/2023     1 DOS: the patient was seen and examined on 10/07/2023   Subjective: Patient seen and examined at bedside this morning  Laying in bed  Worried whether he is going to survive through this illness  Denies nausea vomiting abdominal pain or chest pain  Did have some pain to the left knee   Brief Narrative:    Mathew Simpson is a 87 y.o. 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 Lequita Halt on 10/7 who recently discharged to [facility]F on 10/10 on IV antibiotics who presented to [facility] ED on 10/12 with report of fever.  Facility reports fever of 101.4.  Patient reports significant pain to the left knee, denies cough, no dysuria.  Has PICC line in place and has been receiving IV antibiotics from recent bacteremia/knee joint infection.  EMS administered Tylenol and 500 mL bolus and transported to the ED for further evaluation.   In the ED, temperature 98.7 F, HR 93, RR 22, BP 120/73, SpO2 98% on room air.  WBC 7.5, hemoglobin 9.1, platelet count 264.  Sodium 139, potassium 3.7, chloride 101, CO2 26, glucose 197, BUN 17, creatinine 0.94.  AST 54, ALT 14, total bilirubin 1.0.  Lactic acid 2.1>1.8.  COVID/flu/RSV PCR negative.  Urinalysis with trace leukocytes, negative nitrite, no bacteria, 11-20 WBCs.  Chest x-ray with no active cardiopulmonary disease process.  Blood cultures x 2 and urine culture obtained.  Patient was continued on IV Novox with cefazolin as previously prescribed.  Orthopedics was consulted who recommended medicine admission; and will follow patient while inpatient.  TRH consulted for admission.   Assessment & Plan:   Fever Recent MSSA  bacteremia 2/2 left knee periprosthetic joint infection Status post arthrocentesis by orthopedics on 10/05/2023 which yielded 30cc of blood Patient presenting to the ED from SNF with fever reportedly 101.4 F.  Recent MSSA bacteremia secondary to left knee periprosthetic joint infection in which patient underwent I&D with poly exchange on 09/28/2023.  Was seen by infectious disease during previous hospitalization and discharged on cefazolin via PICC line with projected end date 11/09/2023.  Chest x-ray unrevealing, urinalysis negative.  Patient does have significant erythema, swelling to left knee.   Paracentesis reviewed cell count 25K WBCs (44K on 10/2) and 98% neutrophils. PT is on board and case discussed Continue to follow-up on [place] follow-up with culture results -- Procalcitonin 0.20, reassuring -- Blood cultures x 2: No growth x 4 days Continue antibiotics with IV cefazolin 1 g IV every 8 hours (End 11/18) CBC closely I have discussed the case with transition of care manager today  Right cephalic vein superficial venous thrombosis D-dimer elevated.  Vascular duplex ultrasound right upper extremity negative for DVT but findings consistent with acute superficial vein thrombosis.  Left lower extremity venous duplex ultrasound negative for DVT. Continue supportive care   Persistent atrial fibrillation on anticoagulation Essential hypertension Continue metoprolol tartrate 12.5 mg p.o. twice daily Continue Eliquis 5 mg p.o. twice daily Continue telemetry monitoring   Type 2 diabetes mellitus Hemoglobin A1c 6.7 on 09/08/2023, well-controlled.   Diet controlled at baseline. Blood glucose closely   History of recent TBI/SDH for mechanical fall Presenting from SNF TOC on board concerning return to skilled nursing  facility      DVT prophylaxis:  apixaban (ELIQUIS) tablet 5 mg     Code Status: Limited: Do not attempt resuscitation (DNR)  -DNR-LIMITED -Do Not Intubate/DNI   Family  Communication: No family present at bedside this morning   Disposition Plan:  Level of care: Med-Surg  The patient will require care spanning > 2 midnights and should be moved to inpatient because: Continued workup for fever, MSSA bacteremia, prosthetic left knee joint infection in which he received repeat arthrocentesis by orthopedics yesterday       Consultants:  Orthopedics, Dr. Despina Hick     Physical Exam: GEN: NAD, alert and oriented x 3, elderly/chronically ill in appearance HEENT: NCAT, PERRL, EOMI, sclera clear, MMM, poor dentition, noted posterior scalp scar PULM: CTAB w/o wheezes/crackles, normal respiratory effort, on room air CV: RRR w/o M/G/R GI: abd soft, NTND, NABS, no R/G/M MSK: 2-3 + LLE peripheral edema up to thigh, noted edematous left knee with erythema, TTP, + pain w/ AROM/PROM NEURO: No focal neurological deficits PSYCH: normal mood/affect Integumentary: Noted with erythema of the left ankle with dressing in place   Data Reviewed: I have reviewed patient's lab reports as shown below as well as previous provider documentation as well as orthopedics surgeon documentation and TOC manager documentation, PT OT documentation and nursing documentation   Time spent: 45 minutes  Vitals:   10/06/23 1957 10/07/23 0442 10/07/23 0806 10/07/23 1219  BP: 121/74 119/63 121/68 139/85  Pulse: 78 73 75 84  Resp: 17 17 18 18   Temp: 98.6 F (37 C) 98.8 F (37.1 C) 99 F (37.2 C) 99.2 F (37.3 C)  TempSrc: Oral Oral Oral Oral  SpO2: 95% 99% 98% 95%  Weight:      Height:          Latest Ref Rng & Units 10/07/2023    6:42 AM 10/04/2023    6:28 AM 10/03/2023    4:26 AM  BMP  Glucose 70 - 99 mg/dL 147  829  562   BUN 8 - 23 mg/dL 22  14  15    Creatinine 0.61 - 1.24 mg/dL 1.30  8.65  7.84   Sodium 135 - 145 mmol/L 133  138  140   Potassium 3.5 - 5.1 mmol/L 3.3  3.7  3.8   Chloride 98 - 111 mmol/L 97  98  102   CO2 22 - 32 mmol/L 27  28  26    Calcium 8.9 - 10.3 mg/dL  7.7  8.0  7.8        Latest Ref Rng & Units 10/07/2023    6:42 AM 10/04/2023    6:28 AM 10/03/2023    4:26 AM  CBC  WBC 4.0 - 10.5 K/uL 6.2  7.3  6.9   Hemoglobin 13.0 - 17.0 g/dL 8.7  8.8  9.2   Hematocrit 39.0 - 52.0 % 26.0  27.3  28.5   Platelets 150 - 400 K/uL 245  246  255       Author: Loyce Dys, MD 10/07/2023 2:37 PM  For on call review [email or web address].

## 2023-10-07 NOTE — Consult Note (Signed)
WOC Nurse Consult Note: Reason for Consult: Consult requested for buttocks.  This was previously performed by [facility] team on 10/7 and affected areas have declined.  Wound type: Bilat upper buttocks with red moist macerated skin related to moisture associated skin damage, not pressure. Affected areas have evolved into full thickness skin loss in the middle of each buttock; 50% red, 50% yellow, mod amt tan drainage; each wound is approx 3X3X.1cm.  Lower buttocks/posterior thighs/inner groin with red moist macerated skin related to moisture associated skin damage with red macular papular rash; appearance is consistent with probable candidiasis.  Dressing procedure/placement/frequency: Air mattress ordered to reduce pressure and increase air flow. Condom cath in place to attempt to contain urine. Topical treatment orders provided for bedside nurses to perform as follows to promote healing and repel moisture: 1. Apply Medihoney to bilat middle buttock wounds Q day, then cover with foam dressing.  Change foam dressing Q 3 days or PRN soiling 2. Apply Gerhardts cream to anterior and posterior groin/lower buttocks BID and PRN when turning and cleaning, then apply antifungal powder over the cream each time.  Please re-consult if further assistance is needed.  Thank-you,  Cammie Mcgee MSN, RN, CWOCN, [place], CNS [phone]

## 2023-10-08 DIAGNOSIS — R509 Fever, unspecified: Secondary | ICD-10-CM | POA: Diagnosis not present

## 2023-10-08 LAB — BASIC METABOLIC PANEL
Anion gap: 11 (ref 5–15)
BUN: 19 mg/dL (ref 8–23)
CO2: 28 mmol/L (ref 22–32)
Calcium: 8.2 mg/dL — ABNORMAL LOW (ref 8.9–10.3)
Chloride: 97 mmol/L — ABNORMAL LOW (ref 98–111)
Creatinine, Ser: 0.88 mg/dL (ref 0.61–1.24)
GFR, Estimated: >60 mL/min (ref >60)
Glucose, Bld: 147 mg/dL — ABNORMAL HIGH (ref 70–99)
Potassium: 3.8 mmol/L (ref 3.5–5.1)
Sodium: 136 mmol/L (ref 135–145)

## 2023-10-08 LAB — CBC
HCT: 27.4 % — ABNORMAL LOW (ref 39.0–52.0)
Hemoglobin: 8.8 g/dL — ABNORMAL LOW (ref 13.0–17.0)
MCH: 30.2 pg (ref 26.0–34.0)
MCHC: 32.1 g/dL (ref 30.0–36.0)
MCV: 94.2 fL (ref 80.0–100.0)
Platelets: 256 10*3/uL (ref 150–400)
RBC: 2.91 MIL/uL — ABNORMAL LOW (ref 4.22–5.81)
RDW: 13.7 % (ref 11.5–15.5)
WBC: 6.3 10*3/uL (ref 4.0–10.5)
nRBC: 0 % (ref 0.0–0.2)

## 2023-10-08 MED ORDER — HYDROCODONE-ACETAMINOPHEN 5-325 MG PO TABS: 1.0000 | ORAL_TABLET | ORAL | 0 refills | Status: DC | PRN

## 2023-10-08 MED ORDER — SODIUM CHLORIDE 0.9% FLUSH: 10.0000 mL | INTRAVENOUS | Status: DC | PRN

## 2023-10-08 MED ORDER — HEPARIN SOD (PORK) LOCK FLUSH 100 UNIT/ML IV SOLN
250.0000 [IU] | INTRAVENOUS | Status: AC | PRN
  Administered 2023-10-08: 250 [IU]
  Filled 2023-10-08: qty 2.5

## 2023-10-08 NOTE — Care Plan (Signed)
Patient discharged back to [facility]. Report given to Adela Glimpse, LPN at facility. Patient to be discharged with RUA PICC line to continue IV antibiotic therapy. IV team flushed and clamped PICC for discharge. Patient to be transported by [facility]. All personal belongings taken with patient.

## 2023-10-08 NOTE — TOC Transition Note (Signed)
Transition of Care [facility]) - CM/SW Discharge Note   Patient Details  Name: Mathew Simpson MRN: [id number] Date of Birth: 18-Oct-1936  Transition of Care [facility]) CM/SW Contact:  Lorri Frederick, LCSW Phone Number: 10/08/2023, 1:19 PM   Clinical Narrative:   Pt discharging to [facility], room 501.  RN call report to [phone].   0830: CSW confirmed with Nikki/[facility] that they can receive pt today.     Final next level of care: Skilled Nursing Facility Barriers to Discharge: Barriers Resolved   Patient Goals and CMS Choice   Choice offered to / list presented to : Adult Children (granddaughter: Thomasa)  Discharge Placement                Patient chooses bed at: [facility] Patient to be transferred to facility by: ptar Name of family member notified: LM with daughter Jacki Cones, granddaughter [name].  Unable to reach son. Patient and family notified of of transfer: 10/08/23  Discharge Plan and Services Additional resources added to the After Visit Summary for   In-house Referral: Clinical Social Work   Post Acute Care Choice: Skilled Nursing Facility                               Social Determinants of Health (SDOH) Interventions SDOH Screenings   Food Insecurity: No Food Insecurity (10/07/2023)  Housing: Low Risk  (10/07/2023)  Transportation Needs: No Transportation Needs (10/07/2023)  Utilities: Not At Risk (10/07/2023)  Tobacco Use: Medium Risk (10/02/2023)     Readmission Risk Interventions    07/23/2022   11:40 AM 07/21/2022   10:11 AM  Readmission Risk Prevention Plan  Post Dischage Appt Complete Complete  Medication Screening Complete Complete  Transportation Screening Complete Complete

## 2023-10-08 NOTE — Progress Notes (Signed)
Physical Therapy Treatment Patient Details Name: Mathew Simpson MRN: [id number] DOB: April 08, 1936 Today's Date: 10/08/2023   History of Present Illness 87 y.o. male who presents to [facility] 10/11 with a fever. Recently discharged to [facility] on 10/10 after being admitted for over a month after a fall with ICH on 9/17. 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 After attempts at LE ROM exercises (pt only tolerated partial ROM to RLE and no ROM or movement to LLE), pt continued to state he hoped to get up, but refused when offered assistance. Pt mobility limited by cognition this session, as we were unable to reorient patient to situation or progress to sitting EOB. Of note, pt has stood with both this therapist and others during his stay, more limited by cognition at this time than in prior sessions. Continues to need skilled PT to progress functional strength, activity tolerance, and mobility, recommendations remain appropriate.     If plan is discharge home, recommend the following: Two people to help with walking and/or transfers;Two people to help with bathing/dressing/bathroom;Assist for transportation;Help with stairs or ramp for entrance;Assistance with cooking/housework   Can travel by private vehicle     No  Equipment Recommendations  Other (comment) (defer to post acute)    Recommendations for Other Services       Precautions / Restrictions Precautions Precautions: Fall Precaution Comments: likely BPPV component Restrictions Weight Bearing Restrictions: Yes LLE Weight Bearing: Weight bearing as tolerated Other Position/Activity Restrictions: WBAT L LE     Mobility  Bed  Mobility Overal bed mobility: Needs Assistance             General bed mobility comments: pt dependent on HOB elevation to come to long-sitting in bed, refused all mobility despite initial agreement, would need maxA of 2 to complete transition to sitting EOB    Transfers Overall transfer level: Needs assistance                 General transfer comment: pt refusing to sit EOB this session       Balance Overall balance assessment: Needs assistance     Sitting balance - Comments: pt dependent on posterior support in bed this session, declining repositioning Postural control: Posterior lean                                  Cognition Arousal: Lethargic Behavior During Therapy: WFL for tasks assessed/performed Overall Cognitive Status: Impaired/Different from baseline Area of Impairment: Orientation, Following commands, Safety/judgement, Awareness, Problem solving                 Orientation Level: Disoriented to, Situation, Time     Following Commands: Follows one step commands inconsistently, Follows one step commands with increased time Safety/Judgement: Decreased awareness of safety, Decreased awareness of deficits Awareness: Intellectual Problem Solving: Slow processing, Decreased initiation, Difficulty sequencing, Requires verbal cues General Comments: Pt required multiple attempts through session to wake up, was able to respond to1-2 questions but then quickly falling back asleep. pt stating he wants to get up, but then refusing assistance and not following instructions or able to process  need for assistance to achieve his stated goal of getting up.        Exercises General Exercises - Lower Extremity Ankle Circles/Pumps: PROM, Both, 10 reps, Limitations Ankle Circles/Pumps Limitations: pt resisting at times, allowing for minimal ROM to LLE Quad Sets: AROM, Right, 5 reps Heel Slides: AAROM, Right, Limitations, 10 reps Heel Slides  Limitations: pt resisting at times, did not allow for movement to LLE    General Comments General comments (skin integrity, edema, etc.): VSS on RA      Pertinent Vitals/Pain Pain Assessment Pain Assessment: Faces Faces Pain Scale: Hurts whole lot Pain Location: L knee/leg with any attempts at movement Pain Descriptors / Indicators: Grimacing, Guarding, Aching Pain Intervention(s): Limited activity within patient's tolerance, Monitored during session, Repositioned     PT Goals (current goals can now be found in the care plan section) Acute Rehab PT Goals Patient Stated Goal: to rest PT Goal Formulation: With patient Time For Goal Achievement: 10/18/23 Potential to Achieve Goals: Fair Progress towards PT goals: Not progressing toward goals - comment (cognition, pt refusing to mobilize)    Frequency    Min 1X/week       AM-PAC PT "6 Clicks" Mobility   Outcome Measure  Help needed turning from your back to your side while in a flat bed without using bedrails?: A Lot Help needed moving from lying on your back to sitting on the side of a flat bed without using bedrails?: Total Help needed moving to and from a bed to a chair (including a wheelchair)?: Total Help needed standing up from a chair using your arms (e.g., wheelchair or bedside chair)?: Total Help needed to walk in hospital room?: Total Help needed climbing 3-5 steps with a railing? : Total 6 Click Score: 7    End of Session   Activity Tolerance: Patient limited by pain;Other (comment) (cognition) Patient left: in bed;with call bell/phone within reach;with bed alarm set Nurse Communication: Mobility status PT Visit Diagnosis: Other abnormalities of gait and mobility (R26.89);Muscle weakness (generalized) (M62.81);Pain BPPV - Right/Left : Right Pain - Right/Left: Left Pain - part of body: Knee     Time: 1208-1225 PT Time Calculation (min) (ACUTE ONLY): 17 min  Charges:    $Therapeutic Exercise: 8-22  mins PT General Charges $$ ACUTE PT VISIT: 1 Visit                     Vickki Muff, PT, DPT   Acute Rehabilitation Department Office [phone] Secure Chat Communication Preferred   Ronnie Derby 10/08/2023, 12:44 PM

## 2023-10-08 NOTE — Care Management Important Message (Signed)
Important Message  Patient Details  Name: Mathew Simpson MRN: [id number] Date of Birth: June 09, 1936   Important Message Given:  Yes - Medicare IM     Sherilyn Banker 10/08/2023, 2:27 PM

## 2023-10-08 NOTE — Progress Notes (Signed)
Consult placed at 1414 for flush & cap of PICC line prior to d/c. This rn rounded on pt to flush PICC per order but per primary RN, transport to arrive in approx 45 min and pt requires one more dose of antibiotic before discharge. Will return after dose finishes

## 2023-10-08 NOTE — Plan of Care (Signed)
  Problem: Education: Goal: Knowledge of the prescribed therapeutic regimen will improve Outcome: Adequate for Discharge Goal: Individualized Educational Video(s) Outcome: Adequate for Discharge   Problem: Activity: Goal: Ability to avoid complications of mobility impairment will improve Outcome: Adequate for Discharge Goal: Range of joint motion will improve Outcome: Adequate for Discharge   Problem: Clinical Measurements: Goal: Postoperative complications will be avoided or minimized Outcome: Adequate for Discharge   Problem: Pain Management: Goal: Pain level will decrease with appropriate interventions Outcome: Adequate for Discharge   Problem: Skin Integrity: Goal: Will show signs of wound healing Outcome: Adequate for Discharge   Problem: Education: Goal: Knowledge of General Education information will improve Description: Including pain rating scale, medication(s)/side effects and non-pharmacologic comfort measures Outcome: Adequate for Discharge   Problem: Health Behavior/Discharge Planning: Goal: Ability to manage health-related needs will improve Outcome: Adequate for Discharge   Problem: Clinical Measurements: Goal: Ability to maintain clinical measurements within normal limits will improve Outcome: Adequate for Discharge Goal: Will remain free from infection Outcome: Adequate for Discharge Goal: Diagnostic test results will improve Outcome: Adequate for Discharge Goal: Respiratory complications will improve Outcome: Adequate for Discharge Goal: Cardiovascular complication will be avoided Outcome: Adequate for Discharge   Problem: Activity: Goal: Risk for activity intolerance will decrease Outcome: Adequate for Discharge   Problem: Nutrition: Goal: Adequate nutrition will be maintained Outcome: Adequate for Discharge   Problem: Coping: Goal: Level of anxiety will decrease Outcome: Adequate for Discharge   Problem: Elimination: Goal: Will not  experience complications related to bowel motility Outcome: Adequate for Discharge Goal: Will not experience complications related to urinary retention Outcome: Adequate for Discharge   Problem: Pain Managment: Goal: General experience of comfort will improve Outcome: Adequate for Discharge   Problem: Safety: Goal: Ability to remain free from injury will improve Outcome: Adequate for Discharge   Problem: Skin Integrity: Goal: Risk for impaired skin integrity will decrease Outcome: Adequate for Discharge   Problem: Education: Goal: Understanding of post-operative needs will improve Outcome: Adequate for Discharge Goal: Individualized Educational Video(s) Outcome: Adequate for Discharge   Problem: Clinical Measurements: Goal: Postoperative complications will be avoided or minimized Outcome: Adequate for Discharge   Problem: Respiratory: Goal: Will regain and/or maintain adequate ventilation Outcome: Adequate for Discharge

## 2023-10-08 NOTE — Discharge Summary (Signed)
adjustment of the mA and/or kV according to patient size and/or use of iterative reconstruction technique. COMPARISON:  Prior study from 09/16/2023. FINDINGS: Brain: Examination limited by patient positioning. Previously identified para falcine subdural hematoma is slightly decreased in size measuring 2 mm in thickness without mass effect. No significant residual blood products now seen about the previously identified small hemorrhagic contusion at the left vertex. No more than mild residual edema now seen at this location (series 3, image 36). No other new acute intracranial hemorrhage. No acute large vessel territory infarct. Underlying atrophy with mild chronic small vessel ischemic disease, stable. No mass lesion or midline shift. No hydrocephalus. Vascular: Extensive intracranial arterial  dolichoectasia again noted. Associated calcified atherosclerosis. No asymmetric hyperdense vessel. Skull: Scalp soft tissues and calvarium demonstrate no new finding. Sinuses/Orbits: Globes and orbital soft tissues within normal limits. Paranasal sinuses and mastoid air cells are largely clear. Other: None. IMPRESSION: 1. Slight interval decrease in size of small para falcine subdural hematoma, now measuring 2 mm in thickness without mass effect. 2. Interval resolution of previously identified small hemorrhagic contusion at the left vertex. No more than mild residual edema now seen at this location. 3. No other new acute intracranial abnormality. 4. Underlying atrophy with chronic small vessel ischemic disease and intracranial dolichoectasia, stable. Electronically Signed   By: Mathew Simpson M.D.   On: 09/21/2023 01:22   DG CHEST PORT 1 VIEW  Result Date: 09/20/2023 CLINICAL DATA:  Fever EXAM: PORTABLE CHEST 1 VIEW COMPARISON:  09/08/2023 FINDINGS: Heart is borderline in size. Aortic atherosclerosis. No confluent airspace opacities or effusions. No acute bony abnormality. IMPRESSION: No active disease. Electronically Signed   By: Charlett Nose M.D.   On: 09/20/2023 21:43   CT HEAD WO CONTRAST ( )  Result Date: 09/16/2023 CLINICAL DATA:  Subdural hematoma Worsening mental status; follow-up stability of previous SDH EXAM: CT HEAD WITHOUT CONTRAST TECHNIQUE: Contiguous axial images were obtained from the base of the skull through the vertex without intravenous contrast. RADIATION DOSE REDUCTION: This exam was performed according to the departmental dose-optimization program which includes automated exposure control, adjustment of the mA and/or kV according to patient size and/or use of iterative reconstruction technique. COMPARISON:  CT head 09/09/2023. FINDINGS: Motion limited study.  Within this limitation: Brain: Similar 3 mm subdural hemorrhage along the falx. Similar size and decreased density of a  6 mm hemorrhagic contusion in the posterior left frontal lobe at the vertex. No evidence of new/interval acute hemorrhage, mass lesion, midline shift or hydrocephalus. 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 Calcific atherosclerosis. Skull: No acute fracture. Sinuses/Orbits: Clear sinuses.  No acute orbital findings. Other: No mastoid effusions. IMPRESSION: 1. Similar 3 mm subdural hemorrhage along the falx. 2. Similar size and decreased density of a 6 mm hemorrhagic contusion in the posterior left frontal lobe at the vertex. 3. 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 Electronically Signed   By: Feliberto Harts M.D.   On: 09/16/2023 15:49   ECHOCARDIOGRAM COMPLETE  Result Date: 09/15/2023    ECHOCARDIOGRAM REPORT   Patient Name:   Mathew Simpson Date of Exam: 09/15/2023 Medical Rec #:  [id number]     Height:       70.0 in Accession #:    [id number]    Weight:       195.0 lb Date of Birth:  [birth year]      BSA:          2.065 m Patient Age:    42 years  Physician Discharge Summary   Patient: Mathew Simpson MRN: [id number] DOB: 07/09/1936  Admit date:     10/02/2023  Discharge date: 10/08/23  Discharge Physician: Loyce Dys   PCP: [name], PA-C   Recommendations at discharge:  Follow-up with infectious disease and orthopedics  Discharge Diagnoses: Recent MSSA bacteremia 2/2 left knee periprosthetic joint infection Status post arthrocentesis by orthopedics on 10/05/2023 which yielded 30cc of blood with no evidence of infection Right cephalic vein superficial venous thrombosis Persistent atrial fibrillation on anticoagulation Essential hypertension Type 2 diabetes mellitus History of recent TBI/SDH for mechanical fall   Hospital Course:  Mathew Simpson is a 87 y.o. 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 Lequita Halt on 10/7 who recently discharged to [facility] on 10/10 on IV antibiotics who presented to [facility] ED on 10/12 with report of fever.  Facility reports fever of 101.4.  Patient reports significant pain to the left knee, denies cough, no dysuria.  Has PICC line in place and has been receiving IV antibiotics from recent bacteremia/knee joint infection.  EMS administered Tylenol and 500 mL bolus and transported to the ED for further evaluation.   In the ED, temperature 98.7 F, HR 93, RR 22, BP 120/73, SpO2 98% on room air.  WBC 7.5, hemoglobin 9.1, platelet count 264.  Sodium 139, potassium 3.7, chloride 101, CO2 26, glucose 197, BUN 17, creatinine 0.94.  AST 54, ALT 14, total bilirubin 1.0.  Lactic acid 2.1>1.8.  COVID/flu/RSV PCR negative.  Urinalysis with trace leukocytes, negative nitrite, no bacteria, 11-20 WBCs.  Chest x-ray with no active cardiopulmonary disease  process.  Blood cultures x 2 and urine culture obtained.  Did not show any growth.  Was seen by orthopedic surgeon and underwent arthrocentesis of the left knee that yielded 30 cc of bloody fluid.  No evidence of infection found and patient has been cleared for discharge today to complete antibiotic therapy as before.  Consultants: Podiatry Procedures performed: Arthrocentesis Disposition: Skilled nursing facility Diet recommendation:  Cardiac diet DISCHARGE MEDICATION: Allergies as of 10/08/2023       Reactions   Penicillins Anaphylaxis   Tolerates ancef and cephalexin Did it involve swelling of the face/tongue/throat, SOB, or low BP? Yes Did it involve sudden or severe rash/hives, skin peeling, or any reaction on the inside of your mouth or nose? Unknown Did you need to seek medical attention at a hospital or doctor's office? Yes When did it last happen?      50+ years If all above answers are "NO", may proceed with cephalosporin use.        Medication List     STOP taking these medications    Dialyvite Vitamin D 5000 125 MCG (5000 UT) capsule Generic drug: Cholecalciferol   diphenhydrAMINE 12.5 MG/5ML elixir Commonly known as: BENADRYL   irbesartan 300 MG tablet Commonly known as: AVAPRO   PRESERVISION AREDS 2 PO   Vitamin D (Ergocalciferol) 1.25 MG (50000 UNIT) Caps capsule Commonly known as: DRISDOL       TAKE these medications    acetaminophen 325 MG tablet Commonly known as: TYLENOL Take 2 tablets (650 mg total) by mouth every 6 (six) hours as needed for mild pain (or Fever >/= 101).   bisacodyl 10 MG suppository Commonly known as: DULCOLAX  Physician Discharge Summary   Patient: Mathew Simpson MRN: [id number] DOB: 07/09/1936  Admit date:     10/02/2023  Discharge date: 10/08/23  Discharge Physician: Loyce Dys   PCP: [name], PA-C   Recommendations at discharge:  Follow-up with infectious disease and orthopedics  Discharge Diagnoses: Recent MSSA bacteremia 2/2 left knee periprosthetic joint infection Status post arthrocentesis by orthopedics on 10/05/2023 which yielded 30cc of blood with no evidence of infection Right cephalic vein superficial venous thrombosis Persistent atrial fibrillation on anticoagulation Essential hypertension Type 2 diabetes mellitus History of recent TBI/SDH for mechanical fall   Hospital Course:  Mathew Simpson is a 87 y.o. 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 Lequita Halt on 10/7 who recently discharged to [facility] on 10/10 on IV antibiotics who presented to [facility] ED on 10/12 with report of fever.  Facility reports fever of 101.4.  Patient reports significant pain to the left knee, denies cough, no dysuria.  Has PICC line in place and has been receiving IV antibiotics from recent bacteremia/knee joint infection.  EMS administered Tylenol and 500 mL bolus and transported to the ED for further evaluation.   In the ED, temperature 98.7 F, HR 93, RR 22, BP 120/73, SpO2 98% on room air.  WBC 7.5, hemoglobin 9.1, platelet count 264.  Sodium 139, potassium 3.7, chloride 101, CO2 26, glucose 197, BUN 17, creatinine 0.94.  AST 54, ALT 14, total bilirubin 1.0.  Lactic acid 2.1>1.8.  COVID/flu/RSV PCR negative.  Urinalysis with trace leukocytes, negative nitrite, no bacteria, 11-20 WBCs.  Chest x-ray with no active cardiopulmonary disease  process.  Blood cultures x 2 and urine culture obtained.  Did not show any growth.  Was seen by orthopedic surgeon and underwent arthrocentesis of the left knee that yielded 30 cc of bloody fluid.  No evidence of infection found and patient has been cleared for discharge today to complete antibiotic therapy as before.  Consultants: Podiatry Procedures performed: Arthrocentesis Disposition: Skilled nursing facility Diet recommendation:  Cardiac diet DISCHARGE MEDICATION: Allergies as of 10/08/2023       Reactions   Penicillins Anaphylaxis   Tolerates ancef and cephalexin Did it involve swelling of the face/tongue/throat, SOB, or low BP? Yes Did it involve sudden or severe rash/hives, skin peeling, or any reaction on the inside of your mouth or nose? Unknown Did you need to seek medical attention at a hospital or doctor's office? Yes When did it last happen?      50+ years If all above answers are "NO", may proceed with cephalosporin use.        Medication List     STOP taking these medications    Dialyvite Vitamin D 5000 125 MCG (5000 UT) capsule Generic drug: Cholecalciferol   diphenhydrAMINE 12.5 MG/5ML elixir Commonly known as: BENADRYL   irbesartan 300 MG tablet Commonly known as: AVAPRO   PRESERVISION AREDS 2 PO   Vitamin D (Ergocalciferol) 1.25 MG (50000 UNIT) Caps capsule Commonly known as: DRISDOL       TAKE these medications    acetaminophen 325 MG tablet Commonly known as: TYLENOL Take 2 tablets (650 mg total) by mouth every 6 (six) hours as needed for mild pain (or Fever >/= 101).   bisacodyl 10 MG suppository Commonly known as: DULCOLAX  imaging, microbiology, ancillary and laboratory) are listed below for reference.   Imaging Studies: VAS [place] UPPER EXTREMITY VENOUS DUPLEX  Result Date: 10/04/2023 UPPER VENOUS STUDY  Patient Name:  Mathew Simpson  Date of Exam:   10/04/2023 Medical Rec #: [id number]      Accession #:    [id number] Date of Birth: 07/03/1936       Patient Gender: M Patient Age:   58 years Exam Location:  [facility] Procedure:      VAS [place] UPPER EXTREMITY VENOUS DUPLEX Referring Phys: ERIC [place] --------------------------------------------------------------------------------  Indications: Fever & positive d-dimer (2.08) Other Indications: PICC line RUE (basilic). Limitations: Line and patient condition. Comparison Study: No previous exams Performing Technologist: Jody Hill RVT, RDMS  Examination Guidelines: A complete evaluation includes  B-mode imaging, spectral Doppler, color Doppler, and power Doppler as needed of all accessible portions of each vessel. Bilateral testing is considered an integral part of a complete examination. Limited examinations for reoccurring indications may be performed as noted.  Right Findings: +----------+------------+---------+-----------+----------+--------------------+ RIGHT     CompressiblePhasicitySpontaneousProperties      Summary        +----------+------------+---------+-----------+----------+--------------------+ IJV           Full       Yes       Yes                                   +----------+------------+---------+-----------+----------+--------------------+ Subclavian               Yes       Yes                   patent by                                                              color/doppler     +----------+------------+---------+-----------+----------+--------------------+ Axillary      Full       Yes       Yes                                   +----------+------------+---------+-----------+----------+--------------------+ Brachial                                               Not visualized    +----------+------------+---------+-----------+----------+--------------------+ Radial        Full                                                       +----------+------------+---------+-----------+----------+--------------------+ Ulnar         Full                                                       +----------+------------+---------+-----------+----------+--------------------+  imaging, microbiology, ancillary and laboratory) are listed below for reference.   Imaging Studies: VAS [place] UPPER EXTREMITY VENOUS DUPLEX  Result Date: 10/04/2023 UPPER VENOUS STUDY  Patient Name:  Mathew Simpson  Date of Exam:   10/04/2023 Medical Rec #: [id number]      Accession #:    [id number] Date of Birth: 07/03/1936       Patient Gender: M Patient Age:   58 years Exam Location:  [facility] Procedure:      VAS [place] UPPER EXTREMITY VENOUS DUPLEX Referring Phys: ERIC [place] --------------------------------------------------------------------------------  Indications: Fever & positive d-dimer (2.08) Other Indications: PICC line RUE (basilic). Limitations: Line and patient condition. Comparison Study: No previous exams Performing Technologist: Jody Hill RVT, RDMS  Examination Guidelines: A complete evaluation includes  B-mode imaging, spectral Doppler, color Doppler, and power Doppler as needed of all accessible portions of each vessel. Bilateral testing is considered an integral part of a complete examination. Limited examinations for reoccurring indications may be performed as noted.  Right Findings: +----------+------------+---------+-----------+----------+--------------------+ RIGHT     CompressiblePhasicitySpontaneousProperties      Summary        +----------+------------+---------+-----------+----------+--------------------+ IJV           Full       Yes       Yes                                   +----------+------------+---------+-----------+----------+--------------------+ Subclavian               Yes       Yes                   patent by                                                              color/doppler     +----------+------------+---------+-----------+----------+--------------------+ Axillary      Full       Yes       Yes                                   +----------+------------+---------+-----------+----------+--------------------+ Brachial                                               Not visualized    +----------+------------+---------+-----------+----------+--------------------+ Radial        Full                                                       +----------+------------+---------+-----------+----------+--------------------+ Ulnar         Full                                                       +----------+------------+---------+-----------+----------+--------------------+  Physician Discharge Summary   Patient: Mathew Simpson MRN: [id number] DOB: 07/09/1936  Admit date:     10/02/2023  Discharge date: 10/08/23  Discharge Physician: Loyce Dys   PCP: [name], PA-C   Recommendations at discharge:  Follow-up with infectious disease and orthopedics  Discharge Diagnoses: Recent MSSA bacteremia 2/2 left knee periprosthetic joint infection Status post arthrocentesis by orthopedics on 10/05/2023 which yielded 30cc of blood with no evidence of infection Right cephalic vein superficial venous thrombosis Persistent atrial fibrillation on anticoagulation Essential hypertension Type 2 diabetes mellitus History of recent TBI/SDH for mechanical fall   Hospital Course:  Mathew Simpson is a 87 y.o. 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 Lequita Halt on 10/7 who recently discharged to [facility] on 10/10 on IV antibiotics who presented to [facility] ED on 10/12 with report of fever.  Facility reports fever of 101.4.  Patient reports significant pain to the left knee, denies cough, no dysuria.  Has PICC line in place and has been receiving IV antibiotics from recent bacteremia/knee joint infection.  EMS administered Tylenol and 500 mL bolus and transported to the ED for further evaluation.   In the ED, temperature 98.7 F, HR 93, RR 22, BP 120/73, SpO2 98% on room air.  WBC 7.5, hemoglobin 9.1, platelet count 264.  Sodium 139, potassium 3.7, chloride 101, CO2 26, glucose 197, BUN 17, creatinine 0.94.  AST 54, ALT 14, total bilirubin 1.0.  Lactic acid 2.1>1.8.  COVID/flu/RSV PCR negative.  Urinalysis with trace leukocytes, negative nitrite, no bacteria, 11-20 WBCs.  Chest x-ray with no active cardiopulmonary disease  process.  Blood cultures x 2 and urine culture obtained.  Did not show any growth.  Was seen by orthopedic surgeon and underwent arthrocentesis of the left knee that yielded 30 cc of bloody fluid.  No evidence of infection found and patient has been cleared for discharge today to complete antibiotic therapy as before.  Consultants: Podiatry Procedures performed: Arthrocentesis Disposition: Skilled nursing facility Diet recommendation:  Cardiac diet DISCHARGE MEDICATION: Allergies as of 10/08/2023       Reactions   Penicillins Anaphylaxis   Tolerates ancef and cephalexin Did it involve swelling of the face/tongue/throat, SOB, or low BP? Yes Did it involve sudden or severe rash/hives, skin peeling, or any reaction on the inside of your mouth or nose? Unknown Did you need to seek medical attention at a hospital or doctor's office? Yes When did it last happen?      50+ years If all above answers are "NO", may proceed with cephalosporin use.        Medication List     STOP taking these medications    Dialyvite Vitamin D 5000 125 MCG (5000 UT) capsule Generic drug: Cholecalciferol   diphenhydrAMINE 12.5 MG/5ML elixir Commonly known as: BENADRYL   irbesartan 300 MG tablet Commonly known as: AVAPRO   PRESERVISION AREDS 2 PO   Vitamin D (Ergocalciferol) 1.25 MG (50000 UNIT) Caps capsule Commonly known as: DRISDOL       TAKE these medications    acetaminophen 325 MG tablet Commonly known as: TYLENOL Take 2 tablets (650 mg total) by mouth every 6 (six) hours as needed for mild pain (or Fever >/= 101).   bisacodyl 10 MG suppository Commonly known as: DULCOLAX  Cephalic      None       No        No                      Acute         +----------+------------+---------+-----------+----------+--------------------+ Basilic       Full       Yes       Yes                                   +----------+------------+---------+-----------+----------+--------------------+ Unable to compress subclavian due to patient position &  condition. Patent through color and doppler. Limited visualization of basilic due to PICC placement, visualized segments patent. Unable to visualized brachial vein due to PICC line placment.  Left Findings: +----------+------------+---------+-----------+----------+-------+ LEFT      CompressiblePhasicitySpontaneousPropertiesSummary +----------+------------+---------+-----------+----------+-------+ Subclavian    Full       Yes       Yes                      +----------+------------+---------+-----------+----------+-------+  Summary:  Right: No evidence of deep vein thrombosis in the upper extremity. Findings consistent with acute superficial vein thrombosis involving the right cephalic vein. However, unable to visualize the brachial veins. Portions of this exam were limited,please see tech comments.  *See table(s) above for measurements and observations.  Diagnosing physician: Carolynn Sayers Electronically signed by Carolynn Sayers on 10/04/2023 at 12:57:30 PM.    Final    VAS [place] LOWER EXTREMITY VENOUS (DVT)  Result Date: 10/04/2023  Lower Venous DVT Study Patient Name:  Mathew Simpson  Date of Exam:   10/04/2023 Medical Rec #: [id number]      Accession #:    [id number] Date of Birth: [birth year]       Patient Gender: M Patient Age:   23 years Exam Location:  [facility] Procedure:      VAS [place] LOWER EXTREMITY VENOUS (DVT) Referring Phys: ERIC [place] --------------------------------------------------------------------------------  Indications: Pain, and Swelling. Other Indications: Septic left knee joint. Comparison Study: Previous exam 09/18/2019 was negative for DVT. Performing Technologist: Ernestene Mention RVT, RDMS  Examination Guidelines: A complete evaluation includes B-mode imaging, spectral Doppler, color Doppler, and power Doppler as needed of all accessible portions of each vessel. Bilateral testing is considered an integral part of a complete examination. Limited examinations for reoccurring  indications may be performed as noted. The reflux portion of the exam is performed with the patient in reverse Trendelenburg.  +-----+---------------+---------+-----------+----------+--------------+ RIGHTCompressibilityPhasicitySpontaneityPropertiesThrombus Aging +-----+---------------+---------+-----------+----------+--------------+ CFV  Full           Yes      Yes                                 +-----+---------------+---------+-----------+----------+--------------+   +---------+---------------+---------+-----------+----------+--------------+ LEFT     CompressibilityPhasicitySpontaneityPropertiesThrombus Aging +---------+---------------+---------+-----------+----------+--------------+ CFV      Full           Yes      Yes                                 +---------+---------------+---------+-----------+----------+--------------+ SFJ      Full                                                        +---------+---------------+---------+-----------+----------+--------------+  ECHOCARDIOGRAM REPORT   Patient Name:   Mathew Simpson Date of Exam: 09/21/2023 Medical Rec #:  [id number]     Height:       70.0 in Accession #:    [id number]    Weight:       195.0 lb Date of Birth:  1936-04-04      BSA:          2.065 m Patient Age:    87 years      BP:           107/74 mmHg Patient Gender: M             HR:           75 bpm. Exam Location:  Inpatient Procedure: 2D Echo, Cardiac Doppler and Color Doppler Indications:     Bacteremia R78.81  History:        Patient has prior history of Echocardiogram examinations, most                 recent 09/15/2023. Arrythmias:Atrial Fibrillation; Risk                 Factors:Diabetes, Hypertension and Former Smoker.  Sonographer:    Dondra Prader RVT RCS Referring Phys: [id number] ERIC J [place]  Sonographer Comments: Technically challenging study due to limited acoustic windows, Technically difficult study due to poor echo windows, suboptimal parasternal window and suboptimal apical window. Image acquisition challenging due to uncooperative patient and Image acquisition challenging due to respiratory motion. Patient supine. Unable to complete study; patient kept pushing probe away. IMPRESSIONS  1. Left ventricular ejection fraction, by estimation, is 55 to 60%. The left ventricle has normal function. The left ventricle has no regional wall motion abnormalities. There is moderate concentric left ventricular hypertrophy. Left ventricular diastolic parameters are indeterminate.  2. Peak RV-RA gradient 18 mmHg. Right ventricular systolic function is mildly reduced. The right ventricular size is normal.  3. Left atrial size was severely dilated.  4. Right atrial size was mildly dilated.  5. The mitral valve is normal in structure. No evidence of mitral valve regurgitation. No evidence of mitral stenosis.  6. The aortic valve is tricuspid. Aortic valve regurgitation is trivial.  7. Aortic dilatation noted. There is moderate dilatation of the ascending aorta, measuring 45 mm.  8. IVC not visualized.  9. The patient was in atrial fibrillation. 10. Technically difficult study. FINDINGS  Left Ventricle: Left ventricular ejection fraction, by estimation, is 55 to 60%. The left ventricle has normal function. The left ventricle has no regional wall motion abnormalities. The left ventricular internal cavity size was normal in size. There is  moderate concentric left ventricular hypertrophy. Left ventricular  diastolic parameters are indeterminate. Right Ventricle: Peak RV-RA gradient 18 mmHg. The right ventricular size is normal. No increase in right ventricular wall thickness. Right ventricular systolic function is mildly reduced. Left Atrium: Left atrial size was severely dilated. Right Atrium: Right atrial size was mildly dilated. Pericardium: There is no evidence of pericardial effusion. Mitral Valve: The mitral valve is normal in structure. Mild mitral annular calcification. No evidence of mitral valve regurgitation. No evidence of mitral valve stenosis. Tricuspid Valve: The tricuspid valve is normal in structure. Tricuspid valve regurgitation is trivial. Aortic Valve: The aortic valve is tricuspid. Aortic valve regurgitation is trivial. Aortic valve mean gradient measures 2.0 mmHg. Aortic valve peak gradient measures 3.6 mmHg. Aortic valve area, by VTI measures 3.43 cm. Pulmonic Valve: The pulmonic valve was not well visualized.  Physician Discharge Summary   Patient: Mathew Simpson MRN: [id number] DOB: 07/09/1936  Admit date:     10/02/2023  Discharge date: 10/08/23  Discharge Physician: Loyce Dys   PCP: [name], PA-C   Recommendations at discharge:  Follow-up with infectious disease and orthopedics  Discharge Diagnoses: Recent MSSA bacteremia 2/2 left knee periprosthetic joint infection Status post arthrocentesis by orthopedics on 10/05/2023 which yielded 30cc of blood with no evidence of infection Right cephalic vein superficial venous thrombosis Persistent atrial fibrillation on anticoagulation Essential hypertension Type 2 diabetes mellitus History of recent TBI/SDH for mechanical fall   Hospital Course:  Mathew Simpson is a 87 y.o. 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 Lequita Halt on 10/7 who recently discharged to [facility] on 10/10 on IV antibiotics who presented to [facility] ED on 10/12 with report of fever.  Facility reports fever of 101.4.  Patient reports significant pain to the left knee, denies cough, no dysuria.  Has PICC line in place and has been receiving IV antibiotics from recent bacteremia/knee joint infection.  EMS administered Tylenol and 500 mL bolus and transported to the ED for further evaluation.   In the ED, temperature 98.7 F, HR 93, RR 22, BP 120/73, SpO2 98% on room air.  WBC 7.5, hemoglobin 9.1, platelet count 264.  Sodium 139, potassium 3.7, chloride 101, CO2 26, glucose 197, BUN 17, creatinine 0.94.  AST 54, ALT 14, total bilirubin 1.0.  Lactic acid 2.1>1.8.  COVID/flu/RSV PCR negative.  Urinalysis with trace leukocytes, negative nitrite, no bacteria, 11-20 WBCs.  Chest x-ray with no active cardiopulmonary disease  process.  Blood cultures x 2 and urine culture obtained.  Did not show any growth.  Was seen by orthopedic surgeon and underwent arthrocentesis of the left knee that yielded 30 cc of bloody fluid.  No evidence of infection found and patient has been cleared for discharge today to complete antibiotic therapy as before.  Consultants: Podiatry Procedures performed: Arthrocentesis Disposition: Skilled nursing facility Diet recommendation:  Cardiac diet DISCHARGE MEDICATION: Allergies as of 10/08/2023       Reactions   Penicillins Anaphylaxis   Tolerates ancef and cephalexin Did it involve swelling of the face/tongue/throat, SOB, or low BP? Yes Did it involve sudden or severe rash/hives, skin peeling, or any reaction on the inside of your mouth or nose? Unknown Did you need to seek medical attention at a hospital or doctor's office? Yes When did it last happen?      50+ years If all above answers are "NO", may proceed with cephalosporin use.        Medication List     STOP taking these medications    Dialyvite Vitamin D 5000 125 MCG (5000 UT) capsule Generic drug: Cholecalciferol   diphenhydrAMINE 12.5 MG/5ML elixir Commonly known as: BENADRYL   irbesartan 300 MG tablet Commonly known as: AVAPRO   PRESERVISION AREDS 2 PO   Vitamin D (Ergocalciferol) 1.25 MG (50000 UNIT) Caps capsule Commonly known as: DRISDOL       TAKE these medications    acetaminophen 325 MG tablet Commonly known as: TYLENOL Take 2 tablets (650 mg total) by mouth every 6 (six) hours as needed for mild pain (or Fever >/= 101).   bisacodyl 10 MG suppository Commonly known as: DULCOLAX  Cephalic      None       No        No                      Acute         +----------+------------+---------+-----------+----------+--------------------+ Basilic       Full       Yes       Yes                                   +----------+------------+---------+-----------+----------+--------------------+ Unable to compress subclavian due to patient position &  condition. Patent through color and doppler. Limited visualization of basilic due to PICC placement, visualized segments patent. Unable to visualized brachial vein due to PICC line placment.  Left Findings: +----------+------------+---------+-----------+----------+-------+ LEFT      CompressiblePhasicitySpontaneousPropertiesSummary +----------+------------+---------+-----------+----------+-------+ Subclavian    Full       Yes       Yes                      +----------+------------+---------+-----------+----------+-------+  Summary:  Right: No evidence of deep vein thrombosis in the upper extremity. Findings consistent with acute superficial vein thrombosis involving the right cephalic vein. However, unable to visualize the brachial veins. Portions of this exam were limited,please see tech comments.  *See table(s) above for measurements and observations.  Diagnosing physician: Carolynn Sayers Electronically signed by Carolynn Sayers on 10/04/2023 at 12:57:30 PM.    Final    VAS [place] LOWER EXTREMITY VENOUS (DVT)  Result Date: 10/04/2023  Lower Venous DVT Study Patient Name:  Mathew Simpson  Date of Exam:   10/04/2023 Medical Rec #: [id number]      Accession #:    [id number] Date of Birth: [birth year]       Patient Gender: M Patient Age:   23 years Exam Location:  [facility] Procedure:      VAS [place] LOWER EXTREMITY VENOUS (DVT) Referring Phys: ERIC [place] --------------------------------------------------------------------------------  Indications: Pain, and Swelling. Other Indications: Septic left knee joint. Comparison Study: Previous exam 09/18/2019 was negative for DVT. Performing Technologist: Ernestene Mention RVT, RDMS  Examination Guidelines: A complete evaluation includes B-mode imaging, spectral Doppler, color Doppler, and power Doppler as needed of all accessible portions of each vessel. Bilateral testing is considered an integral part of a complete examination. Limited examinations for reoccurring  indications may be performed as noted. The reflux portion of the exam is performed with the patient in reverse Trendelenburg.  +-----+---------------+---------+-----------+----------+--------------+ RIGHTCompressibilityPhasicitySpontaneityPropertiesThrombus Aging +-----+---------------+---------+-----------+----------+--------------+ CFV  Full           Yes      Yes                                 +-----+---------------+---------+-----------+----------+--------------+   +---------+---------------+---------+-----------+----------+--------------+ LEFT     CompressibilityPhasicitySpontaneityPropertiesThrombus Aging +---------+---------------+---------+-----------+----------+--------------+ CFV      Full           Yes      Yes                                 +---------+---------------+---------+-----------+----------+--------------+ SFJ      Full                                                        +---------+---------------+---------+-----------+----------+--------------+  Physician Discharge Summary   Patient: Mathew Simpson MRN: [id number] DOB: 07/09/1936  Admit date:     10/02/2023  Discharge date: 10/08/23  Discharge Physician: Loyce Dys   PCP: [name], PA-C   Recommendations at discharge:  Follow-up with infectious disease and orthopedics  Discharge Diagnoses: Recent MSSA bacteremia 2/2 left knee periprosthetic joint infection Status post arthrocentesis by orthopedics on 10/05/2023 which yielded 30cc of blood with no evidence of infection Right cephalic vein superficial venous thrombosis Persistent atrial fibrillation on anticoagulation Essential hypertension Type 2 diabetes mellitus History of recent TBI/SDH for mechanical fall   Hospital Course:  Mathew Simpson is a 87 y.o. 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 Lequita Halt on 10/7 who recently discharged to [facility] on 10/10 on IV antibiotics who presented to [facility] ED on 10/12 with report of fever.  Facility reports fever of 101.4.  Patient reports significant pain to the left knee, denies cough, no dysuria.  Has PICC line in place and has been receiving IV antibiotics from recent bacteremia/knee joint infection.  EMS administered Tylenol and 500 mL bolus and transported to the ED for further evaluation.   In the ED, temperature 98.7 F, HR 93, RR 22, BP 120/73, SpO2 98% on room air.  WBC 7.5, hemoglobin 9.1, platelet count 264.  Sodium 139, potassium 3.7, chloride 101, CO2 26, glucose 197, BUN 17, creatinine 0.94.  AST 54, ALT 14, total bilirubin 1.0.  Lactic acid 2.1>1.8.  COVID/flu/RSV PCR negative.  Urinalysis with trace leukocytes, negative nitrite, no bacteria, 11-20 WBCs.  Chest x-ray with no active cardiopulmonary disease  process.  Blood cultures x 2 and urine culture obtained.  Did not show any growth.  Was seen by orthopedic surgeon and underwent arthrocentesis of the left knee that yielded 30 cc of bloody fluid.  No evidence of infection found and patient has been cleared for discharge today to complete antibiotic therapy as before.  Consultants: Podiatry Procedures performed: Arthrocentesis Disposition: Skilled nursing facility Diet recommendation:  Cardiac diet DISCHARGE MEDICATION: Allergies as of 10/08/2023       Reactions   Penicillins Anaphylaxis   Tolerates ancef and cephalexin Did it involve swelling of the face/tongue/throat, SOB, or low BP? Yes Did it involve sudden or severe rash/hives, skin peeling, or any reaction on the inside of your mouth or nose? Unknown Did you need to seek medical attention at a hospital or doctor's office? Yes When did it last happen?      50+ years If all above answers are "NO", may proceed with cephalosporin use.        Medication List     STOP taking these medications    Dialyvite Vitamin D 5000 125 MCG (5000 UT) capsule Generic drug: Cholecalciferol   diphenhydrAMINE 12.5 MG/5ML elixir Commonly known as: BENADRYL   irbesartan 300 MG tablet Commonly known as: AVAPRO   PRESERVISION AREDS 2 PO   Vitamin D (Ergocalciferol) 1.25 MG (50000 UNIT) Caps capsule Commonly known as: DRISDOL       TAKE these medications    acetaminophen 325 MG tablet Commonly known as: TYLENOL Take 2 tablets (650 mg total) by mouth every 6 (six) hours as needed for mild pain (or Fever >/= 101).   bisacodyl 10 MG suppository Commonly known as: DULCOLAX  Cephalic      None       No        No                      Acute         +----------+------------+---------+-----------+----------+--------------------+ Basilic       Full       Yes       Yes                                   +----------+------------+---------+-----------+----------+--------------------+ Unable to compress subclavian due to patient position &  condition. Patent through color and doppler. Limited visualization of basilic due to PICC placement, visualized segments patent. Unable to visualized brachial vein due to PICC line placment.  Left Findings: +----------+------------+---------+-----------+----------+-------+ LEFT      CompressiblePhasicitySpontaneousPropertiesSummary +----------+------------+---------+-----------+----------+-------+ Subclavian    Full       Yes       Yes                      +----------+------------+---------+-----------+----------+-------+  Summary:  Right: No evidence of deep vein thrombosis in the upper extremity. Findings consistent with acute superficial vein thrombosis involving the right cephalic vein. However, unable to visualize the brachial veins. Portions of this exam were limited,please see tech comments.  *See table(s) above for measurements and observations.  Diagnosing physician: Carolynn Sayers Electronically signed by Carolynn Sayers on 10/04/2023 at 12:57:30 PM.    Final    VAS [place] LOWER EXTREMITY VENOUS (DVT)  Result Date: 10/04/2023  Lower Venous DVT Study Patient Name:  Mathew Simpson  Date of Exam:   10/04/2023 Medical Rec #: [id number]      Accession #:    [id number] Date of Birth: [birth year]       Patient Gender: M Patient Age:   23 years Exam Location:  [facility] Procedure:      VAS [place] LOWER EXTREMITY VENOUS (DVT) Referring Phys: ERIC [place] --------------------------------------------------------------------------------  Indications: Pain, and Swelling. Other Indications: Septic left knee joint. Comparison Study: Previous exam 09/18/2019 was negative for DVT. Performing Technologist: Ernestene Mention RVT, RDMS  Examination Guidelines: A complete evaluation includes B-mode imaging, spectral Doppler, color Doppler, and power Doppler as needed of all accessible portions of each vessel. Bilateral testing is considered an integral part of a complete examination. Limited examinations for reoccurring  indications may be performed as noted. The reflux portion of the exam is performed with the patient in reverse Trendelenburg.  +-----+---------------+---------+-----------+----------+--------------+ RIGHTCompressibilityPhasicitySpontaneityPropertiesThrombus Aging +-----+---------------+---------+-----------+----------+--------------+ CFV  Full           Yes      Yes                                 +-----+---------------+---------+-----------+----------+--------------+   +---------+---------------+---------+-----------+----------+--------------+ LEFT     CompressibilityPhasicitySpontaneityPropertiesThrombus Aging +---------+---------------+---------+-----------+----------+--------------+ CFV      Full           Yes      Yes                                 +---------+---------------+---------+-----------+----------+--------------+ SFJ      Full                                                        +---------+---------------+---------+-----------+----------+--------------+  Cephalic      None       No        No                      Acute         +----------+------------+---------+-----------+----------+--------------------+ Basilic       Full       Yes       Yes                                   +----------+------------+---------+-----------+----------+--------------------+ Unable to compress subclavian due to patient position &  condition. Patent through color and doppler. Limited visualization of basilic due to PICC placement, visualized segments patent. Unable to visualized brachial vein due to PICC line placment.  Left Findings: +----------+------------+---------+-----------+----------+-------+ LEFT      CompressiblePhasicitySpontaneousPropertiesSummary +----------+------------+---------+-----------+----------+-------+ Subclavian    Full       Yes       Yes                      +----------+------------+---------+-----------+----------+-------+  Summary:  Right: No evidence of deep vein thrombosis in the upper extremity. Findings consistent with acute superficial vein thrombosis involving the right cephalic vein. However, unable to visualize the brachial veins. Portions of this exam were limited,please see tech comments.  *See table(s) above for measurements and observations.  Diagnosing physician: Carolynn Sayers Electronically signed by Carolynn Sayers on 10/04/2023 at 12:57:30 PM.    Final    VAS [place] LOWER EXTREMITY VENOUS (DVT)  Result Date: 10/04/2023  Lower Venous DVT Study Patient Name:  Mathew Simpson  Date of Exam:   10/04/2023 Medical Rec #: [id number]      Accession #:    [id number] Date of Birth: [birth year]       Patient Gender: M Patient Age:   23 years Exam Location:  [facility] Procedure:      VAS [place] LOWER EXTREMITY VENOUS (DVT) Referring Phys: ERIC [place] --------------------------------------------------------------------------------  Indications: Pain, and Swelling. Other Indications: Septic left knee joint. Comparison Study: Previous exam 09/18/2019 was negative for DVT. Performing Technologist: Ernestene Mention RVT, RDMS  Examination Guidelines: A complete evaluation includes B-mode imaging, spectral Doppler, color Doppler, and power Doppler as needed of all accessible portions of each vessel. Bilateral testing is considered an integral part of a complete examination. Limited examinations for reoccurring  indications may be performed as noted. The reflux portion of the exam is performed with the patient in reverse Trendelenburg.  +-----+---------------+---------+-----------+----------+--------------+ RIGHTCompressibilityPhasicitySpontaneityPropertiesThrombus Aging +-----+---------------+---------+-----------+----------+--------------+ CFV  Full           Yes      Yes                                 +-----+---------------+---------+-----------+----------+--------------+   +---------+---------------+---------+-----------+----------+--------------+ LEFT     CompressibilityPhasicitySpontaneityPropertiesThrombus Aging +---------+---------------+---------+-----------+----------+--------------+ CFV      Full           Yes      Yes                                 +---------+---------------+---------+-----------+----------+--------------+ SFJ      Full                                                        +---------+---------------+---------+-----------+----------+--------------+  ECHOCARDIOGRAM REPORT   Patient Name:   Mathew Simpson Date of Exam: 09/21/2023 Medical Rec #:  [id number]     Height:       70.0 in Accession #:    [id number]    Weight:       195.0 lb Date of Birth:  1936-04-04      BSA:          2.065 m Patient Age:    87 years      BP:           107/74 mmHg Patient Gender: M             HR:           75 bpm. Exam Location:  Inpatient Procedure: 2D Echo, Cardiac Doppler and Color Doppler Indications:     Bacteremia R78.81  History:        Patient has prior history of Echocardiogram examinations, most                 recent 09/15/2023. Arrythmias:Atrial Fibrillation; Risk                 Factors:Diabetes, Hypertension and Former Smoker.  Sonographer:    Dondra Prader RVT RCS Referring Phys: [id number] ERIC J [place]  Sonographer Comments: Technically challenging study due to limited acoustic windows, Technically difficult study due to poor echo windows, suboptimal parasternal window and suboptimal apical window. Image acquisition challenging due to uncooperative patient and Image acquisition challenging due to respiratory motion. Patient supine. Unable to complete study; patient kept pushing probe away. IMPRESSIONS  1. Left ventricular ejection fraction, by estimation, is 55 to 60%. The left ventricle has normal function. The left ventricle has no regional wall motion abnormalities. There is moderate concentric left ventricular hypertrophy. Left ventricular diastolic parameters are indeterminate.  2. Peak RV-RA gradient 18 mmHg. Right ventricular systolic function is mildly reduced. The right ventricular size is normal.  3. Left atrial size was severely dilated.  4. Right atrial size was mildly dilated.  5. The mitral valve is normal in structure. No evidence of mitral valve regurgitation. No evidence of mitral stenosis.  6. The aortic valve is tricuspid. Aortic valve regurgitation is trivial.  7. Aortic dilatation noted. There is moderate dilatation of the ascending aorta, measuring 45 mm.  8. IVC not visualized.  9. The patient was in atrial fibrillation. 10. Technically difficult study. FINDINGS  Left Ventricle: Left ventricular ejection fraction, by estimation, is 55 to 60%. The left ventricle has normal function. The left ventricle has no regional wall motion abnormalities. The left ventricular internal cavity size was normal in size. There is  moderate concentric left ventricular hypertrophy. Left ventricular  diastolic parameters are indeterminate. Right Ventricle: Peak RV-RA gradient 18 mmHg. The right ventricular size is normal. No increase in right ventricular wall thickness. Right ventricular systolic function is mildly reduced. Left Atrium: Left atrial size was severely dilated. Right Atrium: Right atrial size was mildly dilated. Pericardium: There is no evidence of pericardial effusion. Mitral Valve: The mitral valve is normal in structure. Mild mitral annular calcification. No evidence of mitral valve regurgitation. No evidence of mitral valve stenosis. Tricuspid Valve: The tricuspid valve is normal in structure. Tricuspid valve regurgitation is trivial. Aortic Valve: The aortic valve is tricuspid. Aortic valve regurgitation is trivial. Aortic valve mean gradient measures 2.0 mmHg. Aortic valve peak gradient measures 3.6 mmHg. Aortic valve area, by VTI measures 3.43 cm. Pulmonic Valve: The pulmonic valve was not well visualized.

## 2023-10-09 LAB — BODY FLUID CULTURE W GRAM STAIN: Culture: NO GROWTH

## 2023-10-23 ENCOUNTER — Encounter (HOSPITAL_COMMUNITY): Payer: Self-pay | Admitting: Emergency Medicine

## 2023-10-23 ENCOUNTER — Other Ambulatory Visit: Payer: Self-pay

## 2023-10-23 ENCOUNTER — Emergency Department (HOSPITAL_COMMUNITY): Payer: Medicare HMO

## 2023-10-23 ENCOUNTER — Inpatient Hospital Stay (HOSPITAL_COMMUNITY)
Admission: EM | Admit: 2023-10-23 | Discharge: 2023-10-27 | DRG: 291 | Discharge status: Hospice | Payer: Medicare HMO | Source: Skilled Nursing Facility | Attending: Internal Medicine | Admitting: Internal Medicine

## 2023-10-23 DIAGNOSIS — R0902 Hypoxemia: Secondary | ICD-10-CM | POA: Diagnosis present

## 2023-10-23 DIAGNOSIS — M545 Low back pain, unspecified: Secondary | ICD-10-CM | POA: Diagnosis present

## 2023-10-23 DIAGNOSIS — E876 Hypokalemia: Secondary | ICD-10-CM | POA: Diagnosis present

## 2023-10-23 DIAGNOSIS — F05 Delirium due to known physiological condition: Secondary | ICD-10-CM | POA: Diagnosis present

## 2023-10-23 DIAGNOSIS — Z79899 Other long term (current) drug therapy: Secondary | ICD-10-CM

## 2023-10-23 DIAGNOSIS — Z8619 Personal history of other infectious and parasitic diseases: Secondary | ICD-10-CM

## 2023-10-23 DIAGNOSIS — M1990 Unspecified osteoarthritis, unspecified site: Secondary | ICD-10-CM | POA: Diagnosis present

## 2023-10-23 DIAGNOSIS — E119 Type 2 diabetes mellitus without complications: Secondary | ICD-10-CM

## 2023-10-23 DIAGNOSIS — Z96653 Presence of artificial knee joint, bilateral: Secondary | ICD-10-CM | POA: Diagnosis present

## 2023-10-23 DIAGNOSIS — D649 Anemia, unspecified: Secondary | ICD-10-CM | POA: Diagnosis present

## 2023-10-23 DIAGNOSIS — I10 Essential (primary) hypertension: Secondary | ICD-10-CM | POA: Diagnosis present

## 2023-10-23 DIAGNOSIS — I4821 Permanent atrial fibrillation: Secondary | ICD-10-CM | POA: Diagnosis present

## 2023-10-23 DIAGNOSIS — L8990 Pressure ulcer of unspecified site, unspecified stage: Secondary | ICD-10-CM | POA: Insufficient documentation

## 2023-10-23 DIAGNOSIS — Z87891 Personal history of nicotine dependence: Secondary | ICD-10-CM

## 2023-10-23 DIAGNOSIS — I110 Hypertensive heart disease with heart failure: Principal | ICD-10-CM | POA: Diagnosis present

## 2023-10-23 DIAGNOSIS — I5033 Acute on chronic diastolic (congestive) heart failure: Secondary | ICD-10-CM

## 2023-10-23 DIAGNOSIS — G9341 Metabolic encephalopathy: Secondary | ICD-10-CM | POA: Insufficient documentation

## 2023-10-23 DIAGNOSIS — I509 Heart failure, unspecified: Principal | ICD-10-CM

## 2023-10-23 DIAGNOSIS — R7989 Other specified abnormal findings of blood chemistry: Secondary | ICD-10-CM | POA: Diagnosis present

## 2023-10-23 DIAGNOSIS — N179 Acute kidney failure, unspecified: Secondary | ICD-10-CM | POA: Diagnosis present

## 2023-10-23 DIAGNOSIS — E440 Moderate protein-calorie malnutrition: Secondary | ICD-10-CM | POA: Diagnosis present

## 2023-10-23 DIAGNOSIS — Z95828 Presence of other vascular implants and grafts: Secondary | ICD-10-CM

## 2023-10-23 DIAGNOSIS — L89312 Pressure ulcer of right buttock, stage 2: Secondary | ICD-10-CM | POA: Diagnosis present

## 2023-10-23 DIAGNOSIS — Z66 Do not resuscitate: Secondary | ICD-10-CM | POA: Diagnosis present

## 2023-10-23 DIAGNOSIS — Z6823 Body mass index (BMI) 23.0-23.9, adult: Secondary | ICD-10-CM

## 2023-10-23 DIAGNOSIS — L89322 Pressure ulcer of left buttock, stage 2: Secondary | ICD-10-CM | POA: Diagnosis present

## 2023-10-23 DIAGNOSIS — Z515 Encounter for palliative care: Secondary | ICD-10-CM

## 2023-10-23 DIAGNOSIS — I482 Chronic atrial fibrillation, unspecified: Secondary | ICD-10-CM | POA: Diagnosis present

## 2023-10-23 DIAGNOSIS — R9431 Abnormal electrocardiogram [ECG] [EKG]: Secondary | ICD-10-CM | POA: Diagnosis present

## 2023-10-23 DIAGNOSIS — Z880 Allergy status to penicillin: Secondary | ICD-10-CM

## 2023-10-23 DIAGNOSIS — Z7901 Long term (current) use of anticoagulants: Secondary | ICD-10-CM

## 2023-10-23 DIAGNOSIS — Z8249 Family history of ischemic heart disease and other diseases of the circulatory system: Secondary | ICD-10-CM

## 2023-10-23 DIAGNOSIS — R791 Abnormal coagulation profile: Secondary | ICD-10-CM | POA: Diagnosis present

## 2023-10-23 LAB — PHOSPHORUS: Phosphorus: 4.8 mg/dL — ABNORMAL HIGH (ref 2.5–4.6)

## 2023-10-23 LAB — CBC WITH DIFFERENTIAL/PLATELET
Abs Immature Granulocytes: 0.09 10*3/uL — ABNORMAL HIGH (ref 0.00–0.07)
Basophils Absolute: 0 10*3/uL (ref 0.0–0.1)
Basophils Relative: 0 %
Eosinophils Absolute: 0 10*3/uL (ref 0.0–0.5)
Eosinophils Relative: 0 %
HCT: 28.3 % — ABNORMAL LOW (ref 39.0–52.0)
Hemoglobin: 8.9 g/dL — ABNORMAL LOW (ref 13.0–17.0)
Immature Granulocytes: 1 %
Lymphocytes Relative: 14 %
Lymphs Abs: 1.1 10*3/uL (ref 0.7–4.0)
MCH: 30.7 pg (ref 26.0–34.0)
MCHC: 31.4 g/dL (ref 30.0–36.0)
MCV: 97.6 fL (ref 80.0–100.0)
Monocytes Absolute: 0.4 10*3/uL (ref 0.1–1.0)
Monocytes Relative: 5 %
Neutro Abs: 5.8 10*3/uL (ref 1.7–7.7)
Neutrophils Relative %: 80 %
Platelets: 245 10*3/uL (ref 150–400)
RBC: 2.9 MIL/uL — ABNORMAL LOW (ref 4.22–5.81)
RDW: 15.8 % — ABNORMAL HIGH (ref 11.5–15.5)
WBC: 7.4 10*3/uL (ref 4.0–10.5)
nRBC: 0.3 % — ABNORMAL HIGH (ref 0.0–0.2)

## 2023-10-23 LAB — COMPREHENSIVE METABOLIC PANEL
ALT: <5 U/L (ref 0–44)
AST: 22 U/L (ref 15–41)
Albumin: 2.6 g/dL — ABNORMAL LOW (ref 3.5–5.0)
Alkaline Phosphatase: 111 U/L (ref 38–126)
Anion gap: 13 (ref 5–15)
BUN: 31 mg/dL — ABNORMAL HIGH (ref 8–23)
CO2: 26 mmol/L (ref 22–32)
Calcium: 8.4 mg/dL — ABNORMAL LOW (ref 8.9–10.3)
Chloride: 100 mmol/L (ref 98–111)
Creatinine, Ser: 1.34 mg/dL — ABNORMAL HIGH (ref 0.61–1.24)
GFR, Estimated: 52 mL/min — ABNORMAL LOW (ref >60)
Glucose, Bld: 150 mg/dL — ABNORMAL HIGH (ref 70–99)
Potassium: 3.2 mmol/L — ABNORMAL LOW (ref 3.5–5.1)
Sodium: 139 mmol/L (ref 135–145)
Total Bilirubin: 1 mg/dL (ref 0.3–1.2)
Total Protein: 6.7 g/dL (ref 6.5–8.1)

## 2023-10-23 LAB — URINALYSIS, ROUTINE W REFLEX MICROSCOPIC
Bilirubin Urine: NEGATIVE
Glucose, UA: NEGATIVE mg/dL
Ketones, ur: NEGATIVE mg/dL
Nitrite: NEGATIVE
Protein, ur: 30 mg/dL — AB
RBC / HPF: >50 RBC/hpf (ref 0–5)
Specific Gravity, Urine: 1.013 (ref 1.005–1.030)
WBC, UA: >50 WBC/hpf (ref 0–5)
pH: 5 (ref 5.0–8.0)

## 2023-10-23 LAB — BLOOD GAS, VENOUS
Acid-Base Excess: 5.6 mmol/L — ABNORMAL HIGH (ref 0.0–2.0)
Bicarbonate: 30.6 mmol/L — ABNORMAL HIGH (ref 20.0–28.0)
O2 Saturation: 35.2 %
Patient temperature: 37
pCO2, Ven: 45 mm[Hg] (ref 44–60)
pH, Ven: 7.44 — ABNORMAL HIGH (ref 7.25–7.43)
pO2, Ven: <31 mm[Hg] — CL (ref 32–45)

## 2023-10-23 LAB — TROPONIN I (HIGH SENSITIVITY)
Troponin I (High Sensitivity): 28 ng/L — ABNORMAL HIGH (ref <18)
Troponin I (High Sensitivity): 27 ng/L — ABNORMAL HIGH (ref <18)

## 2023-10-23 LAB — PROTIME-INR
INR: 8.9 (ref 0.8–1.2)
Prothrombin Time: 73.1 s — ABNORMAL HIGH (ref 11.4–15.2)

## 2023-10-23 LAB — MAGNESIUM: Magnesium: 2.1 mg/dL (ref 1.7–2.4)

## 2023-10-23 LAB — BRAIN NATRIURETIC PEPTIDE: B Natriuretic Peptide: 1817.4 pg/mL — ABNORMAL HIGH (ref 0.0–100.0)

## 2023-10-23 MED ORDER — ACETAMINOPHEN 650 MG RE SUPP: 650.0000 mg | Freq: Four times a day (QID) | RECTAL | Status: DC | PRN

## 2023-10-23 MED ORDER — HYDROCODONE-ACETAMINOPHEN 5-325 MG PO TABS
1.0000 | ORAL_TABLET | ORAL | Status: DC | PRN
  Administered 2023-10-23: 1 via ORAL
  Filled 2023-10-23 (×2): qty 1

## 2023-10-23 MED ORDER — HYDROCODONE-ACETAMINOPHEN 5-325 MG PO TABS
1.0000 | ORAL_TABLET | Freq: Four times a day (QID) | ORAL | Status: DC | PRN
  Administered 2023-10-23: 1 via ORAL
  Filled 2023-10-23: qty 1

## 2023-10-23 MED ORDER — PROCHLORPERAZINE EDISYLATE 10 MG/2ML IJ SOLN: 5.0000 mg | INTRAMUSCULAR | Status: DC | PRN

## 2023-10-23 MED ORDER — METOPROLOL TARTRATE 25 MG PO TABS
12.5000 mg | ORAL_TABLET | Freq: Two times a day (BID) | ORAL | Status: DC
  Administered 2023-10-23: 12.5 mg via ORAL
  Filled 2023-10-23 (×2): qty 1

## 2023-10-23 MED ORDER — MAGNESIUM HYDROXIDE 400 MG/5ML PO SUSP: 30.0000 mL | Freq: Every day | ORAL | Status: DC | PRN

## 2023-10-23 MED ORDER — CEFAZOLIN SODIUM-DEXTROSE 2-4 GM/100ML-% IV SOLN
2.0000 g | Freq: Three times a day (TID) | INTRAVENOUS | Status: DC
  Administered 2023-10-23 – 2023-10-27 (×12): 2 g via INTRAVENOUS
  Filled 2023-10-23 (×13): qty 100

## 2023-10-23 MED ORDER — POLYETHYLENE GLYCOL 3350 17 G PO PACK: 17.0000 g | PACK | Freq: Every day | ORAL | Status: DC | PRN

## 2023-10-23 MED ORDER — FUROSEMIDE 10 MG/ML IJ SOLN
20.0000 mg | Freq: Once | INTRAMUSCULAR | Status: AC
  Administered 2023-10-23: 20 mg via INTRAVENOUS
  Filled 2023-10-23: qty 4

## 2023-10-23 MED ORDER — MAGNESIUM SULFATE 2 GM/50ML IV SOLN
2.0000 g | Freq: Once | INTRAVENOUS | Status: AC
  Administered 2023-10-23: 2 g via INTRAVENOUS
  Filled 2023-10-23: qty 50

## 2023-10-23 MED ORDER — CHLORHEXIDINE GLUCONATE CLOTH 2 % EX PADS
6.0000 | MEDICATED_PAD | Freq: Every day | CUTANEOUS | Status: DC
  Administered 2023-10-24 – 2023-10-26 (×3): 6 via TOPICAL

## 2023-10-23 MED ORDER — CEFAZOLIN IV (FOR PTA / DISCHARGE USE ONLY): 2.0000 g | Freq: Three times a day (TID) | INTRAVENOUS | Status: DC

## 2023-10-23 MED ORDER — QUETIAPINE FUMARATE 25 MG PO TABS: 12.5000 mg | ORAL_TABLET | Freq: Every day | ORAL | Status: DC

## 2023-10-23 MED ORDER — DILTIAZEM HCL ER COATED BEADS 120 MG PO CP24
120.0000 mg | ORAL_CAPSULE | Freq: Every day | ORAL | Status: DC
  Administered 2023-10-23: 120 mg via ORAL
  Filled 2023-10-23 (×2): qty 1

## 2023-10-23 MED ORDER — DOCUSATE SODIUM 100 MG PO CAPS
100.0000 mg | ORAL_CAPSULE | Freq: Two times a day (BID) | ORAL | Status: DC
  Administered 2023-10-23: 100 mg via ORAL
  Filled 2023-10-23 (×2): qty 1

## 2023-10-23 MED ORDER — ACETAMINOPHEN 325 MG PO TABS: 650.0000 mg | ORAL_TABLET | Freq: Four times a day (QID) | ORAL | Status: DC | PRN

## 2023-10-23 MED ORDER — HYDROMORPHONE HCL 1 MG/ML IJ SOLN
0.5000 mg | INTRAMUSCULAR | Status: AC | PRN
  Administered 2023-10-23 – 2023-10-24 (×3): 0.5 mg via INTRAVENOUS
  Filled 2023-10-23 (×5): qty 0.5

## 2023-10-23 MED ORDER — GERHARDT'S BUTT CREAM: 1.0000 | TOPICAL_CREAM | Freq: Three times a day (TID) | CUTANEOUS | Status: DC

## 2023-10-23 MED ORDER — POTASSIUM CHLORIDE CRYS ER 20 MEQ PO TBCR
40.0000 meq | EXTENDED_RELEASE_TABLET | Freq: Once | ORAL | Status: DC
  Filled 2023-10-23: qty 2

## 2023-10-23 MED ORDER — FUROSEMIDE 10 MG/ML IJ SOLN
20.0000 mg | Freq: Every day | INTRAMUSCULAR | Status: DC
  Administered 2023-10-24: 20 mg via INTRAVENOUS
  Filled 2023-10-23: qty 2

## 2023-10-23 NOTE — ED Notes (Signed)
Patient transported to CT 

## 2023-10-23 NOTE — H&P (Signed)
History and Physical    Patient: Mathew Simpson:[id number] DOB: 06-Jul-1936 DOA: 10/23/2023 DOS: the patient was seen and examined on 10/23/2023 PCP: [name], PA-C  Patient coming from: SNF  Chief Complaint:  Chief Complaint  Patient presents with   Shortness of Breath   HPI: Mathew Simpson is a 87 y.o. male with medical history significant of osteoarthritis, chronic atrial fibrillation, hypertension, glaucoma, hyperbilirubinemia, subdural hematoma, protein malnutrition, infection of prosthetic left knee joint who was brought to the emergency department via EMS due to dyspnea and hypoxia.  Pulse ox reading of the nursing home was reading just low and when EMS arrived the patient was 90% and.  His O2 sat increased to 99% after being given [place] oxygen at 2 LPM.  He is unable to elaborate as he is disoriented to time/date.  He knows he is in the hospital, but did not know which hospital.  He is able to answer simple questions.  He still feels dyspneic, but no headache, sore throat, chest back or abdominal pain complaints.  Lab work: CBC showed white count 7.4, hemoglobin 9.9 g deciliter platelets 245.  PT 73.1 seconds and INR 8.9.  Venous blood gas showed a pH of 7.44, pCO2 of 40 and pO2 less than 31 mmHg.  Bicarbonate was 30.6 and acid-base deficit 5.6 mmol/L.  First troponin 29 ng/L and BNP 1817.4 pg/mL.  CMP showed a potassium of 3.2 mmol/L, the rest of the electrolytes are normal after calcium correction.  Glucose 150, creatinine 1.34 and BUN 31 mg/deciliter.  Hepatic functions showed an albumin of 2.6 g/dL, the rest of the LFTs were normal.  Imaging: Portable 1 view chest radiograph show interval development of bibasilar collapse/consolidative opacity with superimposed vascular congestion.  Pneumonia not excluded.  CT head/CT C-spine without contrast with no evidence of acute intracranial or cervical injury.   ED course: Initial vital signs were temperature 97.7 F, pulse 86, respiration  30, BP 154/67 mmHg O2 sat 100% on nasal cannula oxygen at 2 LPM.  The patient received furosemide 20 mg IVP x 1.  Review of Systems: As mentioned in the history of present illness. All other systems reviewed and are negative. Past Medical History:  Diagnosis Date   Arthritis    back,   DJD (degenerative joint disease)    Dysrhythmia    A-fib   Glaucoma    Hypertension    Past Surgical History:  Procedure Laterality Date   EYE SURGERY     bil    I & D KNEE WITH POLY EXCHANGE Left 09/28/2023   Procedure: IRRIGATION AND DEBRIDEMENT KNEE WITH POLY EXCHANGE;  Surgeon: Ollen Gross, MD;  Location: [facility] OR;  Service: Orthopedics;  Laterality: Left;   JOINT REPLACEMENT     knee surgery     arthroscopic   TOTAL KNEE ARTHROPLASTY Left 09/12/2019   Procedure: TOTAL KNEE ARTHROPLASTY;  Surgeon: Ollen Gross, MD;  Location: [facility] ORS;  Service: Orthopedics;  Laterality: Left;    TOTAL KNEE ARTHROPLASTY Right 03/12/2020   Procedure: TOTAL KNEE ARTHROPLASTY;  Surgeon: Ollen Gross, MD;  Location: [facility] OR[facility];  Service: Orthopedics;  Laterality: Right;    Social History:  reports that he has quit smoking. His smoking use included cigarettes. He has a 20 pack-year smoking history. He has never used smokeless tobacco. He reports that he does not currently use alcohol. He reports that he does not currently use drugs.  Allergies  Allergen Reactions   Penicillins Anaphylaxis  Tolerates ancef and cephalexin  Did it involve swelling of the face/tongue/throat, SOB, or low BP? Yes Did it involve sudden or severe rash/hives, skin peeling, or any reaction on the inside of your mouth or nose? Unknown Did you need to seek medical attention at a hospital or doctor's office? Yes When did it last happen?      50+ years If all above answers are "NO", may proceed with cephalosporin use.     Family History  Problem Relation Age of Onset   Hypertension Other     Prior to Admission medications    Medication Sig Start Date End Date Taking? Authorizing Provider  acetaminophen (TYLENOL) 325 MG tablet Take 2 tablets (650 mg total) by mouth every 6 (six) hours as needed for mild pain (or Fever >/= 101). 10/01/23   Arnetha Courser, MD  apixaban (ELIQUIS) 5 MG TABS tablet Take 5 mg by mouth 2 (two) times daily.    [provider]  bisacodyl (DULCOLAX) 10 MG suppository Place 1 suppository (10 mg total) rectally daily as needed for moderate constipation. 10/01/23   Arnetha Courser, MD  Calcium Citrate-Vitamin D (CALCIUM + D PO) Take 1 tablet by mouth 2 (two) times daily.    [provider]  ceFAZolin (ANCEF) IVPB Inject 2 g into the vein every 8 (eight) hours. Indication:  MSSA L-knee PJI First Dose: Yes Last Day of Therapy:  11/09/23 Labs - Once weekly:  CBC/D and BMP, Labs - Once weekly: ESR and CRP Method of administration: IV Push Method of administration may be changed at the discretion of home infusion pharmacist based upon assessment of the patient and/or caregiver's ability to self-administer the medication ordered. 10/01/23 11/11/23  Arnetha Courser, MD  diltiazem (CARDIZEM CD) 120 MG 24 hr capsule Take 1 capsule (120 mg total) by mouth daily. 10/02/23   Arnetha Courser, MD  docusate sodium (COLACE) 100 MG capsule Take 1 capsule (100 mg total) by mouth 2 (two) times daily. 10/01/23   Arnetha Courser, MD  EPINEPHrine 0.3 mg/0.3 mL IJ SOAJ injection Inject 0.3 mg into the muscle once as needed (if patient exhibits significant signs and symptoms of allergic reaction.). 10/01/23   Arnetha Courser, MD  HYDROcodone-acetaminophen (NORCO/VICODIN) 5-325 MG tablet Take 1-2 tablets by mouth every 4 (four) hours as needed for moderate pain (pain score 4-6). 10/08/23   Loyce Dys, MD  magnesium hydroxide (MILK OF MAGNESIA) 400 MG/5ML suspension Take 30 mLs by mouth daily as needed for mild constipation.    [provider]  melatonin 3 MG TABS tablet Take 2 tablets (6 mg total) by  mouth at bedtime. 10/01/23   Arnetha Courser, MD  metoprolol tartrate (LOPRESSOR) 25 MG tablet Take 0.5 tablets (12.5 mg total) by mouth 2 (two) times daily. 10/01/23   Arnetha Courser, MD  Multiple Vitamin (MULTIVITAMIN WITH MINERALS) TABS tablet Take 1 tablet by mouth daily at 2 PM.     [provider]  Nystatin (GERHARDT'S BUTT CREAM) CREA Apply 1 Application topically 3 (three) times daily. 10/01/23   Arnetha Courser, MD  ondansetron (ZOFRAN) 4 MG tablet Take 1 tablet (4 mg total) by mouth every 6 (six) hours as needed for nausea. 10/01/23   Arnetha Courser, MD  polyethylene glycol (MIRALAX / GLYCOLAX) 17 g packet Take 17 g by mouth daily as needed for mild constipation. 10/01/23   Arnetha Courser, MD  QUEtiapine (SEROQUEL) 25 MG tablet Take 0.5 tablets (12.5 mg total) by mouth at bedtime. 10/01/23   Arnetha Courser,  MD  UNABLE TO FIND Take 1 application  by mouth in the morning, at noon, and at bedtime. Med Name: magic cup supplement    [provider]    Physical Exam: Vitals:   10/23/23 0443 10/23/23 0444 10/23/23 0545 10/23/23 0600  BP: (!) 154/67  (!) 152/61 (!) 175/77  Pulse: 86  92 (!) 57  Resp: (!) 30  (!) 34 (!) 21  Temp: 97.7 F (36.5 C)     SpO2: 100%  97% 91%  Weight:  86 kg    Height:  5\' 10"  (1.778 m)     Physical Exam Vitals and nursing note reviewed.  Constitutional:      General: He is awake. He is not in acute distress.    Appearance: He is overweight. He is ill-appearing.     Interventions: Nasal cannula in place.  HENT:     Head: Normocephalic.     Nose: No rhinorrhea.     Mouth/Throat:     Mouth: Mucous membranes are moist.  Eyes:     General: No scleral icterus.    Pupils: Pupils are equal, round, and reactive to light.  Neck:     Vascular: No JVD.  Cardiovascular:     Rate and Rhythm: Normal rate. Rhythm irregularly irregular.     Heart sounds: S1 normal and S2 normal.  Pulmonary:     Effort: Tachypnea present.     Breath sounds: Rales  present. No wheezing or rhonchi.  Abdominal:     General: Bowel sounds are normal. There is no distension.     Palpations: Abdomen is soft.     Tenderness: There is no abdominal tenderness. There is no guarding.  Musculoskeletal:     Cervical back: Neck supple.     Right lower leg: No tenderness. 1+ Pitting Edema present.     Left lower leg: No tenderness. 1+ Pitting Edema present.  Skin:    General: Skin is warm and dry.  Neurological:     General: No focal deficit present.     Mental Status: He is alert. Mental status is at baseline. He is disoriented.  Psychiatric:        Mood and Affect: Mood normal.        Behavior: Behavior normal. Behavior is cooperative.    Data Reviewed:  Results are pending, will review when available.  09/21/2023 TTE IMPRESSIONS:   1. Left ventricular ejection fraction, by estimation, is 55 to 60%. The left ventricle has normal function. The left ventricle has no regional wall motion abnormalities. There is moderate concentric left ventricular hypertrophy. Left ventricular diastolic parameters are indeterminate.  2. Peak RV-RA gradient 18 mmHg. Right ventricular systolic function is mildly reduced. The right ventricular size is normal.  3. Left atrial size was severely dilated.  4. Right atrial size was mildly dilated.  5. The mitral valve is normal in structure. No evidence of mitral valve regurgitation. No evidence of mitral stenosis.  6. The aortic valve is tricuspid. Aortic valve regurgitation is trivial.  7. Aortic dilatation noted. There is moderate dilatation of the ascending aorta, measuring 45 mm.  8. IVC not visualized.  9. The patient was in atrial fibrillation. 10. Technically difficult study.  EKG: Vent. rate 93 BPM PR interval * ms QRS duration 107 ms QT/QTcB 412/513 ms P-R-T axes * 55 20 Atrial fibrillation Prolonged QT intervalC  Assessment and Plan: Principal Problem:   Acute on chronic diastolic congestive heart  failure (HCC) Observation/PCU. Continue supplemental oxygen.  Sodium and fluid restriction. Continue furosemide 20 mg IVP daily. Monitor daily weights, intake and output. Continue metoprolol 12.5 mg p.o. twice daily.  Active Problems:   Essential hypertension Continue diltiazem and 120 mg p.o. daily. Continue metoprolol as above. Monitor blood pressure and heart rate.    Chronic atrial fibrillation (HCC) With DOAC induced:   Supratherapeutic INR Hold apixaban for now. Daily PT/INR measurement. Will give reversal therapy if bleeding occurs.    DM2 (diabetes mellitus, type 2) (HCC) Carbohydrate modified diet. CBG monitoring with RI SS. Check hemoglobin A1c.    Protein-calorie malnutrition, moderate (HCC) In the setting of anemia and malignancy. May benefit from protein supplementation. Consider nutritional services evaluation. Follow-up albumin level.    Hypokalemia Replacing. Magnesium was supplemented.    Prolonged QT interval Avoid QT prolonging meds as possible. KCl supplementation Magnesium sulfate 2 g IVPB now. Keep electrolytes optimized. Check EKG in the morning.    Normocytic anemia Monitor hematocrit and hemoglobin.    Elevated serum creatinine Does not meet criteria for AKI. Follow-up renal function closely.    Advance Care Planning:   Code Status: Do not attempt resuscitation (DNR) PRE-ARREST INTERVENTIONS DESIRED   Consults:   Family Communication:   Severity of Illness: The appropriate patient status for this patient is OBSERVATION. Observation status is judged to be reasonable and necessary in order to provide the required intensity of service to ensure the patient's safety. The patient's presenting symptoms, physical exam findings, and initial radiographic and laboratory data in the context of their medical condition is felt to place them at decreased risk for further clinical deterioration. Furthermore, it is anticipated that the patient will  be medically stable for discharge from the hospital within 2 midnights of admission.   Author: Bobette Mo, MD 10/23/2023 7:06 AM  For on call review [email or web address].   This document was prepared using Dragon voice recognition software and may contain some unintended transcription errors.

## 2023-10-23 NOTE — ED Notes (Signed)
Patient has small skin tear on left forearm. Covered with mepilex

## 2023-10-23 NOTE — ED Notes (Signed)
Patient has taken off oxygen multiple times. Patient redirected to keep it on.

## 2023-10-23 NOTE — ED Notes (Addendum)
Patient removed external cath. Patient's depends changed and peri care done.

## 2023-10-23 NOTE — ED Notes (Signed)
Refused breakfast.

## 2023-10-23 NOTE — ED Notes (Signed)
ED TO INPATIENT HANDOFF REPORT  Name/Age/Gender Mathew Simpson 87 y.o. male  Code Status    Code Status Orders  (From admission, onward)           Start     Ordered   10/23/23 0824  Do not attempt resuscitation (DNR) Pre-Arrest Interventions Desired  Continuous       Question Answer Comment  If pulseless and not breathing No CPR or chest compressions.   In Pre-Arrest Conditions (Patient Has Pulse and Is Breathing) May intubate, use advanced airway interventions and cardioversion/ACLS medications if appropriate or indicated. May transfer to ICU.   Consent: Discussion documented in EHR or advanced directives reviewed      10/23/23 0823           Code Status History     Date Active Date Inactive Code Status Order ID Comments User Context   10/23/2023 0712 10/23/2023 0823 Do not attempt resuscitation (DNR) PRE-ARREST INTERVENTIONS DESIRED [id number]  Bobette Mo, MD ED   10/03/2023 0031 10/08/2023 2040 Limited: Do not attempt resuscitation (DNR) -DNR-LIMITED -Do Not Intubate/DNI  [id number]  Sunnie Nielsen, DO ED   09/08/2023 1902 10/01/2023 2005 Full Code [id number]  Therisa Doyne, MD ED   07/17/2022 1316 07/23/2022 1828 Full Code [id number]  Bobette Mo, MD ED   03/12/2020 1619 03/15/2020 2139 Full Code [id number]  Derenda Fennel, PA Inpatient   09/15/2019 2355 09/20/2019 2052 Full Code [id number]  Eduard Clos, MD ED   09/12/2019 1302 09/13/2019 1926 Full Code [id number]  Ollen Gross, MD Inpatient      Advance Directive Documentation    Flowsheet Row Most Recent Value  Type of Advance Directive Out of facility DNR (pink MOST or yellow form)  Pre-existing out of facility DNR order (yellow form or pink MOST form) --  "MOST" Form in Place? --       Home/SNF/Other Rehab  Chief Complaint Acute on chronic diastolic congestive heart failure (HCC) [I50.33]  Level of Care/Admitting Diagnosis ED Disposition     ED Disposition  Admit    Condition  --   Comment  Hospital Area: [facility] [[id number]]  Level of Care: Progressive [102]  Admit to Progressive based on following criteria: RESPIRATORY PROBLEMS hypoxemic/hypercapnic respiratory failure that is responsive to NIPPV (BiPAP) or High Flow Nasal Cannula (6-80 lpm). Frequent assessment/intervention, no > Q2 hrs < Q4 hrs, to maintain oxygenation and pulmonary hygiene.  May place patient in observation at [facility] or [facility] if equivalent level of care is available:: No  Covid Evaluation: Asymptomatic - no recent exposure (last 10 days) testing not required  Diagnosis: Acute on chronic diastolic congestive heart failure [facility]) [[id number]]  Admitting Physician: Bobette Mo [[id number]]  Attending Physician: Bobette Mo [[id number]]          Medical History Past Medical History:  Diagnosis Date   Arthritis    back,   DJD (degenerative joint disease)    Dysrhythmia    A-fib   Glaucoma    Hypertension     Allergies Allergies  Allergen Reactions   Penicillins Anaphylaxis and Other (See Comments)    Tolerates ancef and cephalexin  Did it involve swelling of the face/tongue/throat, SOB, or low BP? Yes Did it involve sudden or severe rash/hives, skin peeling, or any reaction on the inside of your mouth or nose? Unknown Did you need to seek medical attention at a hospital or doctor's office? Yes When did it last happen?  50+ years If all above answers are "NO", may proceed with cephalosporin use.     IV Location/Drains/Wounds Patient Lines/Drains/Airways Status     Active Line/Drains/Airways     Name Placement date Placement time Site Days   PICC Single Lumen 09/29/23 Right Basilic 39 cm 0 cm 09/29/23  6578  Basilic  24   Wound / Incision (Open or Dehisced) 09/21/23 Skin tear Arm Distal;Lower;Posterior;Right two area of skin tear right posterior hand and right FA 09/21/23  0012  Arm  32   Wound / Incision (Open or Dehisced)  09/28/23 Irritant Dermatitis (Moisture Associated Skin Damage) Buttocks Lower;Right;Left full thickness skin loss related to moisture assovciated skin damage 09/28/23  1000  Buttocks  25            Labs/Imaging Results for orders placed or performed during the hospital encounter of 10/23/23 (from the past 48 hour(s))  Blood gas, venous     Status: Abnormal   Collection Time: 10/23/23  5:18 AM  Result Value Ref Range   pH, Ven 7.44 (H) 7.25 - 7.43   pCO2, Ven 45 44 - 60 mmHg   pO2, Ven <31 (LL) 32 - 45 mmHg    Comment: CRITICAL RESULT CALLED TO, READ BACK BY AND VERIFIED WITH: Barnett Applebaum RN AT [phone] ON 10/23/2023 BY MECIAL J.    Bicarbonate 30.6 (H) 20.0 - 28.0 mmol/L   Acid-Base Excess 5.6 (H) 0.0 - 2.0 mmol/L   O2 Saturation 35.2 %   Patient temperature 37.0     Comment: Performed at [facility], [address], [place], [place] [address]  Comprehensive metabolic panel     Status: Abnormal   Collection Time: 10/23/23  5:19 AM  Result Value Ref Range   Sodium 139 135 - 145 mmol/L   Potassium 3.2 (L) 3.5 - 5.1 mmol/L   Chloride 100 98 - 111 mmol/L   CO2 26 22 - 32 mmol/L   Glucose, Bld 150 (H) 70 - 99 mg/dL    Comment: Glucose reference range applies only to samples taken after fasting for at least 8 hours.   BUN 31 (H) 8 - 23 mg/dL   Creatinine, Ser 4.13 (H) 0.61 - 1.24 mg/dL   Calcium 8.4 (L) 8.9 - 10.3 mg/dL   Total Protein 6.7 6.5 - 8.1 g/dL   Albumin 2.6 (L) 3.5 - 5.0 g/dL   AST 22 15 - 41 U/L   ALT <5 0 - 44 U/L   Alkaline Phosphatase 111 38 - 126 U/L   Total Bilirubin 1.0 0.3 - 1.2 mg/dL   GFR, Estimated 52 (L) >60 mL/min    Comment: (NOTE) Calculated using the CKD-EPI Creatinine Equation (2021)    Anion gap 13 5 - 15    Comment: Performed at [facility], [address], [place], [place] [address]  Troponin I (High Sensitivity)     Status: Abnormal   Collection Time: 10/23/23  5:19 AM  Result Value Ref Range   Troponin I  (High Sensitivity) 28 (H) <18 ng/L    Comment: (NOTE) Elevated high sensitivity troponin I (hsTnI) values and significant  changes across serial measurements may suggest ACS but many other  chronic and acute conditions are known to elevate hsTnI results.  Refer to the "Links" section for chest pain algorithms and additional  guidance. Performed at [facility], [address] [place], [place] [address]   CBC with Differential     Status: Abnormal   Collection  Time: 10/23/23  5:19 AM  Result Value Ref Range   WBC 7.4 4.0 - 10.5 K/uL   RBC 2.90 (L) 4.22 - 5.81 MIL/uL   Hemoglobin 8.9 (L) 13.0 - 17.0 g/dL   HCT 86.5 (L) 78.4 - 69.6 %   MCV 97.6 80.0 - 100.0 fL   MCH 30.7 26.0 - 34.0 pg   MCHC 31.4 30.0 - 36.0 g/dL   RDW 29.5 (H) 28.4 - 13.2 %   Platelets 245 150 - 400 K/uL   nRBC 0.3 (H) 0.0 - 0.2 %   Neutrophils Relative % 80 %   Neutro Abs 5.8 1.7 - 7.7 K/uL   Lymphocytes Relative 14 %   Lymphs Abs 1.1 0.7 - 4.0 K/uL   Monocytes Relative 5 %   Monocytes Absolute 0.4 0.1 - 1.0 K/uL   Eosinophils Relative 0 %   Eosinophils Absolute 0.0 0.0 - 0.5 K/uL   Basophils Relative 0 %   Basophils Absolute 0.0 0.0 - 0.1 K/uL   Immature Granulocytes 1 %   Abs Immature Granulocytes 0.09 (H) 0.00 - 0.07 K/uL    Comment: Performed at [facility], [address] [place], [place] [address]  Protime-INR     Status: Abnormal   Collection Time: 10/23/23  5:19 AM  Result Value Ref Range   Prothrombin Time 73.1 (H) 11.4 - 15.2 seconds   INR 8.9 (HH) 0.8 - 1.2    Comment: CRITICAL RESULT CALLED TO, READ BACK BY AND VERIFIED WITH: Barnett Applebaum RN AT [phone] ON 10/23/23. FA (NOTE) INR goal varies based on device and disease states. Performed at [facility], [address], [place], [place] [address]   Brain natriuretic peptide     Status: Abnormal   Collection Time: 10/23/23  5:20 AM  Result Value Ref Range   B Natriuretic Peptide 1,817.4  (H) 0.0 - 100.0 pg/mL    Comment: Performed at [facility], [address], [place], [place] [address]  Urinalysis, Routine w reflex microscopic -Urine, Clean Catch     Status: Abnormal   Collection Time: 10/23/23  7:45 AM  Result Value Ref Range   Color, Urine YELLOW YELLOW   APPearance HAZY (A) CLEAR   Specific Gravity, Urine 1.013 1.005 - 1.030   pH 5.0 5.0 - 8.0   Glucose, UA NEGATIVE NEGATIVE mg/dL   Hgb urine dipstick LARGE (A) NEGATIVE   Bilirubin Urine NEGATIVE NEGATIVE   Ketones, ur NEGATIVE NEGATIVE mg/dL   Protein, ur 30 (A) NEGATIVE mg/dL   Nitrite NEGATIVE NEGATIVE   Leukocytes,Ua SMALL (A) NEGATIVE   RBC / HPF >50 0 - 5 RBC/hpf   WBC, UA >50 0 - 5 WBC/hpf   Bacteria, UA RARE (A) NONE SEEN   Squamous Epithelial / HPF 0-5 0 - 5 /HPF   Mucus PRESENT    Hyaline Casts, UA PRESENT    Non Squamous Epithelial 0-5 (A) NONE SEEN    Comment: Performed at [facility], [address], [place], [place] [address]  Troponin I (High Sensitivity)     Status: Abnormal   Collection Time: 10/23/23  7:46 AM  Result Value Ref Range   Troponin I (High Sensitivity) 27 (H) <18 ng/L    Comment: (NOTE) Elevated high sensitivity troponin I (hsTnI) values and significant  changes across serial measurements may suggest ACS but many other  chronic and acute conditions are known to elevate hsTnI results.  Refer to the "Links" section for chest pain  algorithms and additional  guidance. Performed at [facility], [address], [place], [place] [address]   Magnesium     Status: None   Collection Time: 10/23/23  7:46 AM  Result Value Ref Range   Magnesium 2.1 1.7 - 2.4 mg/dL    Comment: Performed at [facility], [address] [place], [place] [address]  Phosphorus     Status: Abnormal   Collection Time: 10/23/23  7:46 AM  Result Value Ref Range   Phosphorus 4.8 (H) 2.5 - 4.6 mg/dL    Comment: Performed at [facility], [address], [place], [place] [address]   DG Chest Port 1 View  Result Date: 10/23/2023 CLINICAL DATA:  Shortness of breath. EXAM: PORTABLE CHEST 1 VIEW COMPARISON:  10/02/2023 FINDINGS: The cardio pericardial silhouette is enlarged. Bibasilar collapse/consolidative opacity evident with superimposed vascular congestion. Right PICC line tip overlies the mid SVC level, as before. Bones are diffusely demineralized. Telemetry leads overlie the chest. IMPRESSION: Interval development of bibasilar collapse/consolidative opacity with superimposed vascular congestion. Pneumonia not excluded. Electronically Signed   By: Kennith Center M.D.   On: 10/23/2023 06:22   CT Head Wo Contrast  Result Date: 10/23/2023 CLINICAL DATA:  For give full. Mental status changes unknown cause. Cervical spine trauma. EXAM: CT HEAD WITHOUT CONTRAST CT CERVICAL SPINE WITHOUT CONTRAST TECHNIQUE: Multidetector CT imaging of the head and cervical spine was performed following the standard protocol without intravenous contrast. Multiplanar CT image reconstructions of the cervical spine were also generated. RADIATION DOSE REDUCTION: This exam was performed according to the departmental dose-optimization program which includes automated exposure control, adjustment of the mA and/or kV according to patient size and/or use of iterative reconstruction technique. COMPARISON:  09/09/2023 FINDINGS: CT HEAD FINDINGS Brain: No evidence of acute infarction, hemorrhage, hydrocephalus, extra-axial collection or mass lesion/mass effect. Generalized brain atrophy. Vascular: Marked enlargement and elongation/tortuosity of major intracranial vessels, which are also atheromatous. This is likely from chronic hypertension and stable. Skull: No acute fracture. Sinuses/Orbits: No visible injury. CT CERVICAL SPINE FINDINGS Alignment: Normal. Skull base and vertebrae: No acute fracture. No primary bone lesion or focal pathologic process.  Generalized atrophy. Soft tissues and spinal canal: No prevertebral fluid or swelling. No visible canal hematoma. Disc levels: Generalized degenerative endplate and facet spurring. C3-4 ankylosis. C5-C7 ankylosis. Upper chest: Layering pleural effusions. There is dedicated chest x-ray pending. IMPRESSION: No evidence of acute intracranial or cervical spine injury. Electronically Signed   By: Tiburcio Pea M.D.   On: 10/23/2023 05:55   CT Cervical Spine Wo Contrast  Result Date: 10/23/2023 CLINICAL DATA:  For give full. Mental status changes unknown cause. Cervical spine trauma. EXAM: CT HEAD WITHOUT CONTRAST CT CERVICAL SPINE WITHOUT CONTRAST TECHNIQUE: Multidetector CT imaging of the head and cervical spine was performed following the standard protocol without intravenous contrast. Multiplanar CT image reconstructions of the cervical spine were also generated. RADIATION DOSE REDUCTION: This exam was performed according to the departmental dose-optimization program which includes automated exposure control, adjustment of the mA and/or kV according to patient size and/or use of iterative reconstruction technique. COMPARISON:  09/09/2023 FINDINGS: CT HEAD FINDINGS Brain: No evidence of acute infarction, hemorrhage, hydrocephalus, extra-axial collection or mass lesion/mass effect. Generalized brain atrophy. Vascular: Marked enlargement and elongation/tortuosity of major intracranial vessels, which are also atheromatous. This is likely from chronic hypertension and stable. Skull: No acute fracture. Sinuses/Orbits: No visible injury. CT CERVICAL SPINE FINDINGS Alignment: Normal. Skull base and vertebrae: No  acute fracture. No primary bone lesion or focal pathologic process. Generalized atrophy. Soft tissues and spinal canal: No prevertebral fluid or swelling. No visible canal hematoma. Disc levels: Generalized degenerative endplate and facet spurring. C3-4 ankylosis. C5-C7 ankylosis. Upper chest: Layering pleural  effusions. There is dedicated chest x-ray pending. IMPRESSION: No evidence of acute intracranial or cervical spine injury. Electronically Signed   By: Tiburcio Pea M.D.   On: 10/23/2023 05:55    Pending Labs Unresulted Labs (From admission, onward)     Start     Ordered   10/25/23 0500  Basic metabolic panel  Daily,   R      10/23/23 0823   10/24/23 0500  Comprehensive metabolic panel  Tomorrow morning,   R        10/23/23 0823   10/24/23 0500  CBC  Tomorrow morning,   R        10/23/23 0823   10/24/23 0500  Protime-INR  Daily,   R      10/23/23 0823   10/23/23 0508  Culture, blood (routine x 2)  BLOOD CULTURE X 2,   R (with STAT occurrences)      10/23/23 0508            Vitals/Pain Today's Vitals   10/23/23 1010 10/23/23 1100 10/23/23 1253 10/23/23 1345  BP: (!) 148/50 (!) 156/62  (!) 140/69  Pulse: 99 81  (!) 106  Resp: 17 (!) 21  (!) 25  Temp:   (!) 97.3 F (36.3 C)   TempSrc:   Oral   SpO2: 95% 99%  100%  Weight:      Height:      PainSc:        Isolation Precautions No active isolations  Medications Medications  potassium chloride SA (KLOR-CON M) CR tablet 40 mEq (40 mEq Oral Not Given 10/23/23 0749)  furosemide (LASIX) injection 20 mg (has no administration in time range)  acetaminophen (TYLENOL) tablet 650 mg (has no administration in time range)    Or  acetaminophen (TYLENOL) suppository 650 mg (has no administration in time range)  prochlorperazine (COMPAZINE) injection 5 mg (has no administration in time range)  diltiazem (CARDIZEM CD) 24 hr capsule 120 mg (has no administration in time range)  docusate sodium (COLACE) capsule 100 mg (has no administration in time range)  magnesium hydroxide (MILK OF MAGNESIA) suspension 30 mL (has no administration in time range)  HYDROcodone-acetaminophen (NORCO/VICODIN) 5-325 MG per tablet 1 tablet (1 tablet Oral Given 10/23/23 1438)  metoprolol tartrate (LOPRESSOR) tablet 12.5 mg (has no administration in time  range)  Gerhardt's butt cream 1 Application (has no administration in time range)  polyethylene glycol (MIRALAX / GLYCOLAX) packet 17 g (has no administration in time range)  QUEtiapine (SEROQUEL) tablet 12.5 mg (has no administration in time range)  ceFAZolin (ANCEF) IVPB 2g/100 mL premix (0 g Intravenous Stopped 10/23/23 1232)  furosemide (LASIX) injection 20 mg (20 mg Intravenous Given 10/23/23 0630)  magnesium sulfate IVPB 2 g 50 mL (0 g Intravenous Stopped 10/23/23 0948)    Mobility non-ambulatory

## 2023-10-23 NOTE — Progress Notes (Signed)
No need of bipap at this time. No resp distress noted.

## 2023-10-23 NOTE — ED Provider Notes (Signed)
[place] EMERGENCY DEPARTMENT AT [facility] Provider Note   CSN: [id number] Arrival date & time: 10/23/23  0424     History  Chief Complaint  Patient presents with   Shortness of Breath    Mathew Simpson is a 87 y.o. male.  The history is provided by the patient, the EMS personnel and medical records.  Shortness of Breath Mathew Simpson is a 87 y.o. male who presents to the Emergency Department complaining of difficulty breathing.  Level 5 caveat due to confusion.  History is provided by EMS, as well as the daughter over the phone.  Per EMS patient is from [facility] and staff called out EMS due to patient appearing short of breath with a low pulse ox.  He was started on supplemental oxygen.  He was placed on 2 L nasal cannula.  Patient is unsure why he is in the emergency department but does state he has fallen.  Daughter states that he has had several falls at rehab, last fall was yesterday.  He does have a history of subdural hematoma, sepsis, chronic A-fib on anticoagulation      Home Medications Prior to Admission medications   Medication Sig Start Date End Date Taking? Authorizing Provider  acetaminophen (TYLENOL) 325 MG tablet Take 2 tablets (650 mg total) by mouth every 6 (six) hours as needed for mild pain (or Fever >/= 101). 10/01/23   Arnetha Courser, MD  apixaban (ELIQUIS) 5 MG TABS tablet Take 5 mg by mouth 2 (two) times daily.    [provider]  bisacodyl (DULCOLAX) 10 MG suppository Place 1 suppository (10 mg total) rectally daily as needed for moderate constipation. 10/01/23   Arnetha Courser, MD  Calcium Citrate-Vitamin D (CALCIUM + D PO) Take 1 tablet by mouth 2 (two) times daily.    [provider]  ceFAZolin (ANCEF) IVPB Inject 2 g into the vein every 8 (eight) hours. Indication:  MSSA L-knee PJI First Dose: Yes Last Day of Therapy:  11/09/23 Labs - Once weekly:  CBC/D and BMP, Labs - Once weekly: ESR and CRP Method of  administration: IV Push Method of administration may be changed at the discretion of home infusion pharmacist based upon assessment of the patient and/or caregiver's ability to self-administer the medication ordered. 10/01/23 11/11/23  Arnetha Courser, MD  diltiazem (CARDIZEM CD) 120 MG 24 hr capsule Take 1 capsule (120 mg total) by mouth daily. 10/02/23   Arnetha Courser, MD  docusate sodium (COLACE) 100 MG capsule Take 1 capsule (100 mg total) by mouth 2 (two) times daily. 10/01/23   Arnetha Courser, MD  EPINEPHrine 0.3 mg/0.3 mL IJ SOAJ injection Inject 0.3 mg into the muscle once as needed (if patient exhibits significant signs and symptoms of allergic reaction.). 10/01/23   Arnetha Courser, MD  HYDROcodone-acetaminophen (NORCO/VICODIN) 5-325 MG tablet Take 1-2 tablets by mouth every 4 (four) hours as needed for moderate pain (pain score 4-6). 10/08/23   Loyce Dys, MD  magnesium hydroxide (MILK OF MAGNESIA) 400 MG/5ML suspension Take 30 mLs by mouth daily as needed for mild constipation.    [provider]  melatonin 3 MG TABS tablet Take 2 tablets (6 mg total) by mouth at bedtime. 10/01/23   Arnetha Courser, MD  metoprolol tartrate (LOPRESSOR) 25 MG tablet Take 0.5 tablets (12.5 mg total) by mouth 2 (two) times daily. 10/01/23   Arnetha Courser, MD  Multiple Vitamin (MULTIVITAMIN WITH MINERALS) TABS tablet Take 1 tablet by mouth daily  at 2 PM.     [provider]  Nystatin (GERHARDT'S BUTT CREAM) CREA Apply 1 Application topically 3 (three) times daily. 10/01/23   Arnetha Courser, MD  ondansetron (ZOFRAN) 4 MG tablet Take 1 tablet (4 mg total) by mouth every 6 (six) hours as needed for nausea. 10/01/23   Arnetha Courser, MD  polyethylene glycol (MIRALAX / GLYCOLAX) 17 g packet Take 17 g by mouth daily as needed for mild constipation. 10/01/23   Arnetha Courser, MD  QUEtiapine (SEROQUEL) 25 MG tablet Take 0.5 tablets (12.5 mg total) by mouth at bedtime. 10/01/23   Arnetha Courser, MD  UNABLE TO  FIND Take 1 application  by mouth in the morning, at noon, and at bedtime. Med Name: magic cup supplement    [provider]      Allergies    Penicillins    Review of Systems   Review of Systems  Respiratory:  Positive for shortness of breath.   All other systems reviewed and are negative.   Physical Exam Updated Vital Signs BP 129/72   Pulse 87   Temp 97.7 F (36.5 C)   Resp (!) 30   Ht 5\' 10"  (1.778 m)   Wt 86 kg   SpO2 97%   BMI 27.20 kg/m  Physical Exam Vitals and nursing note reviewed.  Constitutional:      General: He is in acute distress.     Appearance: He is well-developed. He is ill-appearing.  HENT:     Head: Normocephalic and atraumatic.  Cardiovascular:     Rate and Rhythm: Normal rate. Rhythm irregular.     Heart sounds: No murmur heard. Pulmonary:     Comments: Tachypnea with wheezing bilaterally Abdominal:     Palpations: Abdomen is soft.     Tenderness: There is no abdominal tenderness. There is no guarding or rebound.  Musculoskeletal:        General: No tenderness.     Comments: Trace edema to bilateral lower extremities.  There is a healing surgical wound to the left anterior knee.  Skin:    General: Skin is warm and dry.  Neurological:     Mental Status: He is alert and oriented to person, place, and time.     Comments: Confused.  Psychiatric:     Comments: Anxious appearing     ED Results / Procedures / Treatments   Labs (all labs ordered are listed, but only abnormal results are displayed) Labs Reviewed  COMPREHENSIVE METABOLIC PANEL - Abnormal; Notable for the following components:      Result Value   Potassium 3.2 (*)    Glucose, Bld 150 (*)    BUN 31 (*)    Creatinine, Ser 1.34 (*)    Calcium 8.4 (*)    Albumin 2.6 (*)    GFR, Estimated 52 (*)    All other components within normal limits  CBC WITH DIFFERENTIAL/PLATELET - Abnormal; Notable for the following components:   RBC 2.90 (*)    Hemoglobin 8.9 (*)    HCT  28.3 (*)    RDW 15.8 (*)    nRBC 0.3 (*)    Abs Immature Granulocytes 0.09 (*)    All other components within normal limits  BRAIN NATRIURETIC PEPTIDE - Abnormal; Notable for the following components:   B Natriuretic Peptide 1,817.4 (*)    All other components within normal limits  BLOOD GAS, VENOUS - Abnormal; Notable for the following components:   pH, Ven 7.44 (*)  pO2, Ven <31 (*)    Bicarbonate 30.6 (*)    Acid-Base Excess 5.6 (*)    All other components within normal limits  PROTIME-INR - Abnormal; Notable for the following components:   Prothrombin Time 73.1 (*)    INR 8.9 (*)    All other components within normal limits  TROPONIN I (HIGH SENSITIVITY) - Abnormal; Notable for the following components:   Troponin I (High Sensitivity) 28 (*)    All other components within normal limits  CULTURE, BLOOD (ROUTINE X 2)  CULTURE, BLOOD (ROUTINE X 2)  URINALYSIS, ROUTINE W REFLEX MICROSCOPIC  MAGNESIUM  PHOSPHORUS  TROPONIN I (HIGH SENSITIVITY)    EKG EKG Interpretation Date/Time:  Friday October 23 2023 04:37:54 EDT Ventricular Rate:  93 PR Interval:    QRS Duration:  107 QT Interval:  412 QTC Calculation: 513 R Axis:   55  Text Interpretation: Atrial fibrillation Prolonged QT interval Confirmed by Tilden Fossa [phone]) on 10/23/2023 5:24:38 AM  Radiology DG Chest Port 1 View  Result Date: 10/23/2023 CLINICAL DATA:  Shortness of breath. EXAM: PORTABLE CHEST 1 VIEW COMPARISON:  10/02/2023 FINDINGS: The cardio pericardial silhouette is enlarged. Bibasilar collapse/consolidative opacity evident with superimposed vascular congestion. Right PICC line tip overlies the mid SVC level, as before. Bones are diffusely demineralized. Telemetry leads overlie the chest. IMPRESSION: Interval development of bibasilar collapse/consolidative opacity with superimposed vascular congestion. Pneumonia not excluded. Electronically Signed   By: Kennith Center M.D.   On: 10/23/2023 06:22   CT  Head Wo Contrast  Result Date: 10/23/2023 CLINICAL DATA:  For give full. Mental status changes unknown cause. Cervical spine trauma. EXAM: CT HEAD WITHOUT CONTRAST CT CERVICAL SPINE WITHOUT CONTRAST TECHNIQUE: Multidetector CT imaging of the head and cervical spine was performed following the standard protocol without intravenous contrast. Multiplanar CT image reconstructions of the cervical spine were also generated. RADIATION DOSE REDUCTION: This exam was performed according to the departmental dose-optimization program which includes automated exposure control, adjustment of the mA and/or kV according to patient size and/or use of iterative reconstruction technique. COMPARISON:  09/09/2023 FINDINGS: CT HEAD FINDINGS Brain: No evidence of acute infarction, hemorrhage, hydrocephalus, extra-axial collection or mass lesion/mass effect. Generalized brain atrophy. Vascular: Marked enlargement and elongation/tortuosity of major intracranial vessels, which are also atheromatous. This is likely from chronic hypertension and stable. Skull: No acute fracture. Sinuses/Orbits: No visible injury. CT CERVICAL SPINE FINDINGS Alignment: Normal. Skull base and vertebrae: No acute fracture. No primary bone lesion or focal pathologic process. Generalized atrophy. Soft tissues and spinal canal: No prevertebral fluid or swelling. No visible canal hematoma. Disc levels: Generalized degenerative endplate and facet spurring. C3-4 ankylosis. C5-C7 ankylosis. Upper chest: Layering pleural effusions. There is dedicated chest x-ray pending. IMPRESSION: No evidence of acute intracranial or cervical spine injury. Electronically Signed   By: Tiburcio Pea M.D.   On: 10/23/2023 05:55   CT Cervical Spine Wo Contrast  Result Date: 10/23/2023 CLINICAL DATA:  For give full. Mental status changes unknown cause. Cervical spine trauma. EXAM: CT HEAD WITHOUT CONTRAST CT CERVICAL SPINE WITHOUT CONTRAST TECHNIQUE: Multidetector CT imaging of the  head and cervical spine was performed following the standard protocol without intravenous contrast. Multiplanar CT image reconstructions of the cervical spine were also generated. RADIATION DOSE REDUCTION: This exam was performed according to the departmental dose-optimization program which includes automated exposure control, adjustment of the mA and/or kV according to patient size and/or use of iterative reconstruction technique. COMPARISON:  09/09/2023 FINDINGS: CT HEAD FINDINGS Brain:  No evidence of acute infarction, hemorrhage, hydrocephalus, extra-axial collection or mass lesion/mass effect. Generalized brain atrophy. Vascular: Marked enlargement and elongation/tortuosity of major intracranial vessels, which are also atheromatous. This is likely from chronic hypertension and stable. Skull: No acute fracture. Sinuses/Orbits: No visible injury. CT CERVICAL SPINE FINDINGS Alignment: Normal. Skull base and vertebrae: No acute fracture. No primary bone lesion or focal pathologic process. Generalized atrophy. Soft tissues and spinal canal: No prevertebral fluid or swelling. No visible canal hematoma. Disc levels: Generalized degenerative endplate and facet spurring. C3-4 ankylosis. C5-C7 ankylosis. Upper chest: Layering pleural effusions. There is dedicated chest x-ray pending. IMPRESSION: No evidence of acute intracranial or cervical spine injury. Electronically Signed   By: Tiburcio Pea M.D.   On: 10/23/2023 05:55    Procedures Procedures    Medications Ordered in ED Medications  potassium chloride SA (KLOR-CON M) CR tablet 40 mEq (40 mEq Oral Not Given 10/23/23 0749)  furosemide (LASIX) injection 20 mg (20 mg Intravenous Given 10/23/23 0630)    ED Course/ Medical Decision Making/ A&P                                 Medical Decision Making Amount and/or Complexity of Data Reviewed Labs: ordered. Radiology: ordered.  Risk Prescription drug management. Decision regarding  hospitalization.   Patient from [facility] for evaluation of shortness of breath.  He is confused on examination with tachypnea, wheezing.  No history of COPD.  Chest x-ray is concerning for acute CHF.  Radiologist does report cannot rule out an faction although patient has no fevers, crackles and symptoms were sudden in onset.  Legrand Rams that he is dealing with acute CHF overt pneumonia at this time.  Doubt PE, he is on Eliquis and his INR is elevated to 8.  He was treated with furosemide for diuresis.  He is on supplemental oxygen to maintain sats.  Do not feel he will tolerate BiPAP at this time given his confusion.  Discussed with patient's daughter over the phone, she states that confusion is normal for him over the last several months.  Given report of trauma CT head and C-spine were obtained, which are negative for acute traumatic injury.  Given new onset CHF hospitalist consulted for admission for ongoing care.  Patient and daughter(over the phone) updated findings of studies and recommendation for admission and they are in agreement with plan.        Final Clinical Impression(s) / ED Diagnoses Final diagnoses:  Acute congestive heart failure, unspecified heart failure type [facility])    Rx / DC Orders ED Discharge Orders     None         Tilden Fossa, MD 10/23/23 [id number]

## 2023-10-23 NOTE — ED Triage Notes (Signed)
Pt to ED via [facility] from [facility] c/o SOB.  Per EMS staff doing rounds and getting vitals and noticed pt appeared SOB, checked pulse ox and said "it was low".  Pt A&O to self and place on arrival but seems forgetful which baseline is oriented to self per EMS.  Pt O2 90% RA on scene and placed on 2L [place] up to 99%.

## 2023-10-24 DIAGNOSIS — Z515 Encounter for palliative care: Secondary | ICD-10-CM | POA: Diagnosis not present

## 2023-10-24 DIAGNOSIS — E876 Hypokalemia: Secondary | ICD-10-CM | POA: Diagnosis present

## 2023-10-24 DIAGNOSIS — Z87891 Personal history of nicotine dependence: Secondary | ICD-10-CM | POA: Diagnosis not present

## 2023-10-24 DIAGNOSIS — Z79899 Other long term (current) drug therapy: Secondary | ICD-10-CM | POA: Diagnosis not present

## 2023-10-24 DIAGNOSIS — R0902 Hypoxemia: Secondary | ICD-10-CM | POA: Diagnosis present

## 2023-10-24 DIAGNOSIS — E119 Type 2 diabetes mellitus without complications: Secondary | ICD-10-CM | POA: Diagnosis present

## 2023-10-24 DIAGNOSIS — E440 Moderate protein-calorie malnutrition: Secondary | ICD-10-CM | POA: Diagnosis present

## 2023-10-24 DIAGNOSIS — R791 Abnormal coagulation profile: Secondary | ICD-10-CM | POA: Diagnosis present

## 2023-10-24 DIAGNOSIS — Z96653 Presence of artificial knee joint, bilateral: Secondary | ICD-10-CM | POA: Diagnosis present

## 2023-10-24 DIAGNOSIS — R627 Adult failure to thrive: Secondary | ICD-10-CM | POA: Diagnosis not present

## 2023-10-24 DIAGNOSIS — Z95828 Presence of other vascular implants and grafts: Secondary | ICD-10-CM | POA: Diagnosis not present

## 2023-10-24 DIAGNOSIS — L8990 Pressure ulcer of unspecified site, unspecified stage: Secondary | ICD-10-CM | POA: Insufficient documentation

## 2023-10-24 DIAGNOSIS — Z6823 Body mass index (BMI) 23.0-23.9, adult: Secondary | ICD-10-CM | POA: Diagnosis not present

## 2023-10-24 DIAGNOSIS — Z8249 Family history of ischemic heart disease and other diseases of the circulatory system: Secondary | ICD-10-CM | POA: Diagnosis not present

## 2023-10-24 DIAGNOSIS — Z7901 Long term (current) use of anticoagulants: Secondary | ICD-10-CM | POA: Diagnosis not present

## 2023-10-24 DIAGNOSIS — I110 Hypertensive heart disease with heart failure: Secondary | ICD-10-CM | POA: Diagnosis present

## 2023-10-24 DIAGNOSIS — G9341 Metabolic encephalopathy: Secondary | ICD-10-CM | POA: Insufficient documentation

## 2023-10-24 DIAGNOSIS — D649 Anemia, unspecified: Secondary | ICD-10-CM | POA: Diagnosis present

## 2023-10-24 DIAGNOSIS — F05 Delirium due to known physiological condition: Secondary | ICD-10-CM | POA: Diagnosis present

## 2023-10-24 DIAGNOSIS — N179 Acute kidney failure, unspecified: Secondary | ICD-10-CM | POA: Diagnosis present

## 2023-10-24 DIAGNOSIS — Z7189 Other specified counseling: Secondary | ICD-10-CM | POA: Diagnosis not present

## 2023-10-24 DIAGNOSIS — I5033 Acute on chronic diastolic (congestive) heart failure: Secondary | ICD-10-CM | POA: Diagnosis present

## 2023-10-24 DIAGNOSIS — L89312 Pressure ulcer of right buttock, stage 2: Secondary | ICD-10-CM | POA: Diagnosis present

## 2023-10-24 DIAGNOSIS — Z66 Do not resuscitate: Secondary | ICD-10-CM | POA: Diagnosis present

## 2023-10-24 DIAGNOSIS — I4821 Permanent atrial fibrillation: Secondary | ICD-10-CM | POA: Diagnosis present

## 2023-10-24 DIAGNOSIS — L89322 Pressure ulcer of left buttock, stage 2: Secondary | ICD-10-CM | POA: Diagnosis present

## 2023-10-24 LAB — COMPREHENSIVE METABOLIC PANEL
ALT: 5 U/L (ref 0–44)
AST: 19 U/L (ref 15–41)
Albumin: 2.4 g/dL — ABNORMAL LOW (ref 3.5–5.0)
Alkaline Phosphatase: 85 U/L (ref 38–126)
Anion gap: 13 (ref 5–15)
BUN: 37 mg/dL — ABNORMAL HIGH (ref 8–23)
CO2: 28 mmol/L (ref 22–32)
Calcium: 8.1 mg/dL — ABNORMAL LOW (ref 8.9–10.3)
Chloride: 100 mmol/L (ref 98–111)
Creatinine, Ser: 1.44 mg/dL — ABNORMAL HIGH (ref 0.61–1.24)
GFR, Estimated: 48 mL/min — ABNORMAL LOW (ref >60)
Glucose, Bld: 137 mg/dL — ABNORMAL HIGH (ref 70–99)
Potassium: 3 mmol/L — ABNORMAL LOW (ref 3.5–5.1)
Sodium: 141 mmol/L (ref 135–145)
Total Bilirubin: 0.8 mg/dL (ref 0.3–1.2)
Total Protein: 5.9 g/dL — ABNORMAL LOW (ref 6.5–8.1)

## 2023-10-24 LAB — BLOOD GAS, ARTERIAL
Acid-Base Excess: 6 mmol/L — ABNORMAL HIGH (ref 0.0–2.0)
Bicarbonate: 31.1 mmol/L — ABNORMAL HIGH (ref 20.0–28.0)
Drawn by: 213381
O2 Saturation: 89.8 %
Patient temperature: 36.7
pCO2 arterial: 47 mm[Hg] (ref 32–48)
pH, Arterial: 7.42 (ref 7.35–7.45)
pO2, Arterial: 57 mm[Hg] — ABNORMAL LOW (ref 83–108)

## 2023-10-24 LAB — PROTIME-INR
INR: 10.4 (ref 0.8–1.2)
Prothrombin Time: 82.3 s — ABNORMAL HIGH (ref 11.4–15.2)

## 2023-10-24 LAB — CBC
HCT: 25 % — ABNORMAL LOW (ref 39.0–52.0)
Hemoglobin: 7.7 g/dL — ABNORMAL LOW (ref 13.0–17.0)
MCH: 30.8 pg (ref 26.0–34.0)
MCHC: 30.8 g/dL (ref 30.0–36.0)
MCV: 100 fL (ref 80.0–100.0)
Platelets: 214 10*3/uL (ref 150–400)
RBC: 2.5 MIL/uL — ABNORMAL LOW (ref 4.22–5.81)
RDW: 16 % — ABNORMAL HIGH (ref 11.5–15.5)
WBC: 7.8 10*3/uL (ref 4.0–10.5)
nRBC: 0.6 % — ABNORMAL HIGH (ref 0.0–0.2)

## 2023-10-24 LAB — GLUCOSE, CAPILLARY
Glucose-Capillary: 138 mg/dL — ABNORMAL HIGH (ref 70–99)
Glucose-Capillary: 142 mg/dL — ABNORMAL HIGH (ref 70–99)

## 2023-10-24 LAB — AMMONIA: Ammonia: 17 umol/L (ref 9–35)

## 2023-10-24 MED ORDER — POTASSIUM CHLORIDE 10 MEQ/100ML IV SOLN
10.0000 meq | INTRAVENOUS | Status: AC
  Administered 2023-10-24 (×5): 10 meq via INTRAVENOUS
  Filled 2023-10-24 (×6): qty 100

## 2023-10-24 MED ORDER — HYDROMORPHONE HCL 1 MG/ML IJ SOLN: 0.5000 mg | INTRAMUSCULAR | Status: DC | PRN

## 2023-10-24 MED ORDER — INSULIN ASPART 100 UNIT/ML IJ SOLN
0.0000 [IU] | Freq: Three times a day (TID) | INTRAMUSCULAR | Status: DC
  Administered 2023-10-24 – 2023-10-25 (×2): 1 [IU] via SUBCUTANEOUS
  Administered 2023-10-26: 2 [IU] via SUBCUTANEOUS
  Administered 2023-10-26: 1 [IU] via SUBCUTANEOUS

## 2023-10-24 MED ORDER — LORAZEPAM 2 MG/ML IJ SOLN
1.0000 mg | Freq: Four times a day (QID) | INTRAMUSCULAR | Status: DC | PRN
  Administered 2023-10-24 – 2023-10-25 (×3): 1 mg via INTRAVENOUS
  Filled 2023-10-24 (×3): qty 1

## 2023-10-24 MED ORDER — SODIUM CHLORIDE 0.9% FLUSH
10.0000 mL | Freq: Two times a day (BID) | INTRAVENOUS | Status: DC
  Administered 2023-10-24 – 2023-10-27 (×6): 10 mL

## 2023-10-24 MED ORDER — MELATONIN 3 MG PO TABS
6.0000 mg | ORAL_TABLET | Freq: Every day | ORAL | Status: DC
  Administered 2023-10-24 – 2023-10-25 (×2): 6 mg via ORAL
  Filled 2023-10-24 (×2): qty 2

## 2023-10-24 MED ORDER — ORAL CARE MOUTH RINSE: 15.0000 mL | OROMUCOSAL | Status: DC | PRN

## 2023-10-24 MED ORDER — SODIUM CHLORIDE 0.9% FLUSH: 10.0000 mL | INTRAVENOUS | Status: DC | PRN

## 2023-10-24 MED ORDER — QUETIAPINE FUMARATE 25 MG PO TABS
12.5000 mg | ORAL_TABLET | Freq: Every day | ORAL | Status: DC
  Administered 2023-10-24 – 2023-10-25 (×2): 12.5 mg via ORAL
  Filled 2023-10-24 (×2): qty 1

## 2023-10-24 MED ORDER — FUROSEMIDE 10 MG/ML IJ SOLN
40.0000 mg | Freq: Every day | INTRAMUSCULAR | Status: DC
Start: 2023-10-25 — End: 2023-10-25
  Administered 2023-10-25: 40 mg via INTRAVENOUS
  Filled 2023-10-24: qty 4

## 2023-10-24 MED ORDER — POTASSIUM CHLORIDE CRYS ER 20 MEQ PO TBCR
40.0000 meq | EXTENDED_RELEASE_TABLET | ORAL | Status: DC
  Filled 2023-10-24: qty 2

## 2023-10-24 MED ORDER — VITAMIN K1 10 MG/ML IJ SOLN
2.5000 mg | Freq: Once | INTRAVENOUS | Status: AC
  Administered 2023-10-24: 2.5 mg via INTRAVENOUS
  Filled 2023-10-24: qty 0.25

## 2023-10-24 MED ORDER — METOPROLOL TARTRATE 5 MG/5ML IV SOLN
2.5000 mg | Freq: Four times a day (QID) | INTRAVENOUS | Status: DC
  Administered 2023-10-24 – 2023-10-26 (×9): 2.5 mg via INTRAVENOUS
  Filled 2023-10-24 (×9): qty 5

## 2023-10-24 NOTE — Evaluation (Signed)
Occupational Therapy Evaluation Patient Details Name: Mathew Simpson MRN: [id number] DOB: 24-Feb-1936 Today's Date: 10/24/2023   History of Present Illness 87 y.o. male who presents to [facility] who was brought to the emergency department via EMS due to dyspnea and hypoxia. CXR 11/1: "Interval development of bibasilar collapse/consolidative opacity with superimposed vascular congestion. Pneumonia not excluded."  Head CT 11/1 with no acute findings. .  Pt with recent past admissions to [facility] 10/11 with a fever. Recently discharged to [facility] on 10/10 after being admitted for over a month after a fall with ICH on 9/17. 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   Clinical Impression   Patient is currently requiring assistance with ADLs including Total assist with Lower body ADLs, up to Total assist with Upper body ADLs all at bed level in chair position.  Pt moving around in bed, thrashing, reaching and attempting to move to EOB without assistance and Ues appear strong but pt requiring mitts due to pulling at lines and too confused to follow instructions for UE assessment. Per chart review,  Current level of function is below patient's typical baseline especially cognitively.  During this evaluation, patient was limited by possible LT knee pain, impaired activity tolerance with need of 2L via [place], and delirium with confusion, word salad and inability to follow commands, all of which has the potential to impact patient's and caregiver's safety and independence during functional mobility, as well as performance for ADLs.  Patient has been at hospitals or SNFs for several weeks now.   Patient demonstrates fair rehab potential and would have good potential if his cognition clears up to baseline. Pt should benefit from continued skilled occupational therapy  services while in acute care to maximize safety, independence and quality of life at home.  Continued occupational therapy services skilled nursing facility are recommended.  ?        If plan is discharge home, recommend the following: A lot of help with walking and/or transfers;Two people to help with walking and/or transfers;Two people to help with bathing/dressing/bathroom;A lot of help with bathing/dressing/bathroom;Assistance with cooking/housework;Assist for transportation;Supervision due to cognitive status    Functional Status Assessment  Patient has had a recent decline in their functional status and demonstrates the ability to make significant improvements in function in a reasonable and predictable amount of time.  Equipment Recommendations   (defer)    Recommendations for Other Services       Precautions / Restrictions Precautions Precautions: Fall Precaution Comments: Delirium. Hand mitts Restrictions Weight Bearing Restrictions: No LLE Weight Bearing: Weight bearing as tolerated Other Position/Activity Restrictions: WBAT L LE- per chart review      Mobility Bed Mobility Overal bed mobility: Needs Assistance             General bed mobility comments: Prior to Ativan pt getting himself into long sitting position with multiple EOB attempts and pt di appear strong enough to transition to EOB but this was deferred due to pt delirium and inability to follow instructions for safety. Pt able to stay midline with trunk with bed in chair position with trendelenburg.    Transfers                          Balance  ADL either performed or assessed with clinical judgement   ADL Overall ADL's : Needs assistance/impaired Eating/Feeding: Total assistance;Bed level Eating/Feeding Details (indicate cue type and reason): To sip with straw. Grooming: Bed level;Total assistance   Upper Body Bathing: Bed  level;Total assistance   Lower Body Bathing: Total assistance;+2 for physical assistance;Bed level   Upper Body Dressing : Total assistance;Bed level   Lower Body Dressing: Total assistance;Bed level     Toilet Transfer Details (indicate cue type and reason): Deferred due to deliruim. Toileting- Clothing Manipulation and Hygiene: Total assistance;+2 for physical assistance       Functional mobility during ADLs: Supervision/safety (Moving around in bed, swinging LEs off EOB, reaching for IV pole with mitts.)       Vision   Additional Comments: Unable to assess due to cognition.     Perception         Praxis         Pertinent Vitals/Pain Pain Assessment Breathing: occasional labored breathing, short period of hyperventilation Negative Vocalization: occasional moan/groan, low speech, negative/disapproving quality Facial Expression: facial grimacing Body Language: tense, distressed pacing, fidgeting Consolability: distracted or reassured by voice/touch PAINAD Score: 6 Pain Location: Unable to determine due to pt delirium. When asked if his LT knee was painful pt stated "Yeah", but also answering "Yeah" inappropriately with other questions. Pain Descriptors / Indicators: Restless Pain Intervention(s): Monitored during session, Limited activity within patient's tolerance, RN gave pain meds during session     Extremity/Trunk Assessment Upper Extremity Assessment Upper Extremity Assessment: Difficult to assess due to impaired cognition (Appears to have intact  UE movement and stregnth as evidenced by pt puylling himself into long sitting in bed and holding himself up on bed with BUEs.)   Lower Extremity Assessment Lower Extremity Assessment: Difficult to assess due to impaired cognition;Defer to PT evaluation LLE Deficits / Details: Limited knee flexion, significant swelling       Communication Communication Communication: Hearing impairment;Difficulty communicating  thoughts/reduced clarity of speech;Difficulty following commands/understanding Following commands:  (No) Cueing Techniques: Verbal cues;Gestural cues;Tactile cues;Visual cues   Cognition Arousal: Alert Behavior During Therapy: Restless, Agitated, Impulsive Overall Cognitive Status: Impaired/Different from baseline Area of Impairment: Orientation, Following commands, Safety/judgement, Awareness, Problem solving, Attention, Memory                 Orientation Level: Disoriented to, Person, Place, Time, Situation (Unable to verbalize appropriate responses. Unable to say his daughter's name) Current Attention Level: Focused Memory: Decreased recall of precautions, Decreased short-term memory Following Commands:  (Following no commands) Safety/Judgement: Decreased awareness of safety, Decreased awareness of deficits Awareness:  (None) Problem Solving: Slow processing, Decreased initiation, Difficulty sequencing, Requires verbal cues General Comments: Pt very restless and agitated. RN in room attempting to prevent pt from exiting his bed despite bed rails up. RN agreeable to OT working with pt in attempts to calm behavior while RN ran for Ativan. Pt called once bed placed in chair position and trendelenburg. Able to remove mitt from LT hand to allow for pulse and assisted buy holding out hand when mitt was donned again once he received ativan.   Prior to ativan pt was moving and thrashing in bed,  hyperverbal and hitting RN with his mitted hand. Pt did stop hitting with cue.     General Comments       Exercises     Shoulder Instructions      Home Living Family/patient expects to be discharged to:: Skilled nursing facility  Additional Comments: Now at [facility] for Rehab. Has been at [facility] with multiple past hospitalizations.      Prior Functioning/Environment Prior Level of Function : Patient poor historian/Family not available                         OT Problem List: Decreased strength;Pain;Decreased cognition;Decreased safety awareness;Impaired balance (sitting and/or standing);Decreased coordination;Increased edema;Decreased activity tolerance      OT Treatment/Interventions: Self-care/ADL training;Canalith reposition;Therapeutic activities;Patient/family education;DME and/or AE instruction;Neuromuscular education;Balance training;Manual therapy;Cognitive remediation/compensation;Therapeutic exercise    OT Goals(Current goals can be found in the care plan section) Acute Rehab OT Goals OT Goal Formulation: Patient unable to participate in goal setting Time For Goal Achievement: 11/07/23 Potential to Achieve Goals: Fair ADL Goals Pt Will Perform Grooming: sitting;with contact guard assist;with supervision;with set-up Pt Will Perform Upper Body Dressing: with supervision;with set-up;sitting Pt Will Perform Lower Body Dressing: with min assist;sitting/lateral leans;bed level Pt Will Transfer to Toilet: with mod assist;bedside commode;stand pivot transfer Additional ADL Goal #1: Pt will perform bed mobility including rolling and supine<>sit with no more than Min As to decrease caregiver burden and prepare for ADLs. Additional ADL Goal #2: Pt will demonstrate improved mentation, closer to baseline, by identifying at least 50% of common objects used for ADLs, and following 50% of 1-step instructions in order to discharge to least restrictive environment.  OT Frequency: Min 1X/week    Co-evaluation              AM-PAC OT "6 Clicks" Daily Activity     Outcome Measure Help from another person eating meals?: A Lot Help from another person taking care of personal grooming?: Total Help from another person toileting, which includes using toliet, bedpan, or urinal?: Total Help from another person bathing (including washing, rinsing, drying)?: Total Help from another person to put on and taking off regular upper  body clothing?: Total Help from another person to put on and taking off regular lower body clothing?: Total 6 Click Score: 7   End of Session Nurse Communication: Other (comment) (RN in room with OT)  Activity Tolerance: Treatment limited secondary to agitation;Treatment limited secondary to medical complications (Comment) Patient left: in bed;with call bell/phone within reach;with nursing/sitter in room;with bed alarm set  OT Visit Diagnosis: Muscle weakness (generalized) (M62.81);Pain;Other symptoms and signs involving cognitive function;Cognitive communication deficit (R41.841);History of falling (Z91.81);Repeated falls (R29.6);Other abnormalities of gait and mobility (R26.89);Unsteadiness on feet (R26.81);Apraxia (R48.2);Feeding difficulties (R63.3) Pain - Right/Left: Left Pain - part of body: Knee                Time: 1610-9604 OT Time Calculation (min): 19 min Charges:  OT General Charges $OT Visit: 1 Visit OT Evaluation $OT Eval Low Complexity: 1 Low  Janiel Derhammer, OT Acute Rehab Services Office: [phone] 10/24/2023   Theodoro Clock 10/24/2023, 4:00 PM

## 2023-10-24 NOTE — Evaluation (Signed)
Clinical/Bedside Swallow Evaluation Patient Details  Name: Mathew Simpson MRN: [id number] Date of Birth: 08-Apr-1936  Today's Date: 10/24/2023 Time: SLP Start Time (ACUTE ONLY): 1119 SLP Stop Time (ACUTE ONLY): 1128 SLP Time Calculation (min) (ACUTE ONLY): 9 min  Past Medical History:  Past Medical History:  Diagnosis Date   Arthritis    back,   DJD (degenerative joint disease)    Dysrhythmia    A-fib   Glaucoma    Hypertension    Past Surgical History:  Past Surgical History:  Procedure Laterality Date   EYE SURGERY     bil    I & D KNEE WITH POLY EXCHANGE Left 09/28/2023   Procedure: IRRIGATION AND DEBRIDEMENT KNEE WITH POLY EXCHANGE;  Surgeon: Ollen Gross, MD;  Location: [facility] OR;  Service: Orthopedics;  Laterality: Left;   JOINT REPLACEMENT     knee surgery     arthroscopic   TOTAL KNEE ARTHROPLASTY Left 09/12/2019   Procedure: TOTAL KNEE ARTHROPLASTY;  Surgeon: Ollen Gross, MD;  Location: [facility];  Service: Orthopedics;  Laterality: Left;    TOTAL KNEE ARTHROPLASTY Right 03/12/2020   Procedure: TOTAL KNEE ARTHROPLASTY;  Surgeon: Ollen Gross, MD;  Location: [facility];  Service: Orthopedics;  Laterality: Right;    HPI:  Mathew Simpson is a 87 y.o. male who was brought to the emergency department via EMS due to dyspnea and hypoxia. CXR 11/1: "Interval development of bibasilar collapse/consolidative opacity with superimposed vascular congestion. Pneumonia not excluded."  Head CT 11/1 with no acute findings.  Pt with medical history significant of osteoarthritis, chronic atrial fibrillation, hypertension, glaucoma, hyperbilirubinemia, subdural hematoma, protein malnutrition, infection of prosthetic left knee joint    Assessment / Plan / Recommendation  Clinical Impression  Pt presents with clinical indicators of pharyngeal dysphagia.  There was gurgling following cup sip of water.  It is unclear if this is 2/2 AMS/delirium or represents a true oropharyngeal dysphagia.  Pt  tolerated a small amount of water pipetted by straw and placed in oral cavity without any clinical s/s of aspiration.  Pt retrieved water from cup independently without SLP pouring water in oral cavity. Once water was in his mouth he grimaced and shook his head.  There was wet vocal quality prior to swallow.  SLP placed a small amount of puree in oral cavity.  Pt again exhibited displeasure with POs.  He did swallow bolus trial with no clinical s/s of aspiration.  There was mild oral residue.  SLP set up suction and used this to clear oral cavity.  Priority oral meds could be administered crushed in puree; however, pt may be likely to expectorate oral meds at this time.  For this reason, it may be advisable to switch medications to IV form if available.  Pt appears to be at risk for self-discontinuing NG tube if it could be placed. Pt continually expressed displeasure with SLP eval and decliend trials.  SLP is unable to make a safe diet recommendation at this time.  At present, pt is unlikely to take very much by mouth 2/2 delirium.  Pt is not appropriate for further assessment.    Will defer diet decision to team.   SLP Visit Diagnosis: Dysphagia, unspecified (R13.10)    Aspiration Risk  Mild aspiration risk    Diet Recommendation  (Diet decision per team)    Medication Administration: Crushed with puree    Other  Recommendations Oral Care Recommendations: Oral care BID    Recommendations for follow up  therapy are one component of a multi-disciplinary discharge planning process, led by the attending physician.  Recommendations may be updated based on patient status, additional functional criteria and insurance authorization.  Follow up Recommendations Follow physician's recommendations for discharge plan and follow up therapies (continue ST at next level of care)      Assistance Recommended at Discharge    Functional Status Assessment Patient has had a recent decline in their functional  status and demonstrates the ability to make significant improvements in function in a reasonable and predictable amount of time.  Frequency and Duration min 2x/week  2 weeks       Prognosis Prognosis for improved oropharyngeal function: Good (with continued recovery) Barriers to Reach Goals: Cognitive deficits      Swallow Study   General Date of Onset: 11/12/23 HPI: Mathew Simpson is a 87 y.o. male who was brought to the emergency department via EMS due to dyspnea and hypoxia. CXR 11/1: "Interval development of bibasilar collapse/consolidative opacity with superimposed vascular congestion. Pneumonia not excluded."  Head CT 11/1 with no acute findings.  Pt with medical history significant of osteoarthritis, chronic atrial fibrillation, hypertension, glaucoma, hyperbilirubinemia, subdural hematoma, protein malnutrition, infection of prosthetic left knee joint Type of Study: Bedside Swallow Evaluation Previous Swallow Assessment: None Diet Prior to this Study: Regular;Thin liquids (Level 0) Temperature Spikes Noted: No Respiratory Status: Nasal cannula History of Recent Intubation: No Behavior/Cognition: Alert;Uncooperative;Agitated;Confused Oral Cavity Assessment:  (unable to assess) Oral Care Completed by SLP: No Oral Cavity - Dentition: Dentures, top;Edentulous Self-Feeding Abilities: Total assist Patient Positioning: Upright in bed Baseline Vocal Quality: Normal Volitional Cough: Cognitively unable to elicit Volitional Swallow: Unable to elicit    Oral/Motor/Sensory Function Overall Oral Motor/Sensory Function:  (Unable to assess)   Ice Chips Ice chips: Not tested   Thin Liquid Thin Liquid: Impaired Presentation: Cup (straw pipette) Pharyngeal  Phase Impairments: Wet Vocal Quality    Nectar Thick Nectar Thick Liquid: Not tested   Honey Thick Honey Thick Liquid: Not tested   Puree Puree: Impaired Presentation: Spoon Oral Phase Functional Implications: Oral residue    Solid     Solid: Not tested      Kerrie Pleasure, MA, CCC-SLP Acute Rehabilitation Services Office: [phone] 10/24/2023,12:03 PM

## 2023-10-24 NOTE — Progress Notes (Signed)
Patient is agitated in restless with morning assessment and medication administration.   Appears to be in pain - attempted to give potassium, metoprolol, norco, crushed in applesauce. Patient spit out the medications. Wasted medications including norco with second Char[employer] fundraiser. Given IV dilauded as patient is saying his back hurts and is very anxious. Exhibiting signs of delirium.   Notified Dr. Alvino Chapel - came to bedside to assess patient.

## 2023-10-24 NOTE — Progress Notes (Signed)
PT Cancellation Note  Patient Details Name: Mathew Simpson MRN: [id number] DOB: Feb 15, 1936   Cancelled Treatment:    Reason Eval/Treat Not Completed: Medical issues which prohibited therapy Patient  in safety mitts,   restless/moving around in bed, unable to participate. Will check back another time. Blanchard Kelch PT Acute Rehabilitation Services Office [phone] Weekend pager-[phone]   Rada Hay 10/24/2023, 12:03 PM

## 2023-10-24 NOTE — Progress Notes (Signed)
PROGRESS NOTE    Mathew Simpson  WGN:[id number] DOB: [birth year] DOA: 10/23/2023 PCP: [name], PA-C     Brief Narrative:  Mathew Simpson is a 87 y.o. male with medical history significant of osteoarthritis, chronic atrial fibrillation, hypertension, glaucoma, hyperbilirubinemia, subdural hematoma, protein malnutrition, infection of prosthetic left knee joint who was brought to the emergency department via EMS due to dyspnea and hypoxia.   Pulse ox reading at SNF was low and required nasal cannula O2.  He was disoriented but was able to answer simple questions at time of admission.  CT head and CT cervical spine was unremarkable.  Chest x-ray showed interval development of bibasilar collapse/consolidative opacity with superimposed vascular congestion.  Patient was admitted for suspected CHF exacerbation and given IV Lasix.  New events last 24 hours / Subjective: Patient very confused this morning.  Has hand mittens on during my evaluation.  Refused his oral medications.  He is alert but unable to answer questions appropriately.  Per RN, patient did not sleep much last night.  Unclear if he has any history of dementia/sundowning.  Assessment & Plan:   Principal Problem:   Acute on chronic diastolic congestive heart failure (HCC) Active Problems:   Essential hypertension   Chronic atrial fibrillation (HCC)   Hypokalemia   DM2 (diabetes mellitus, type 2) (HCC)   Protein-calorie malnutrition, moderate (HCC)   Prolonged QT interval   Normocytic anemia   Elevated serum creatinine   Supratherapeutic INR   Acute metabolic encephalopathy, delirium -CT head unremarkable -On review of discharge summary 10/01/2023, patient did have some hospital acquired intermittent delirium and was continued on melatonin and Seroquel at night -Continue melatonin, Seroquel nightly -Delirium precaution -Check ammonia, vitamin B1  Acute on chronic diastolic CHF -BNP 1817 -Echo 09/21/23 - EF 55-60% -IV  Lasix  AKI -Baseline creatinine 0.88 -Continue to watch closely in setting of IV Lasix use  Hypertension -Diltiazem on hold as he is refusing PO med -Metoprolol IV   Chronic A-fib -Eliquis on hold due to supratherapeutic INR -Will give one-time dose of IV vit K due to INR > 10   Diabetes mellitus -A1c 6.7 -SSI   Hypokalemia -Replace  Recent MSSA bacteremia secondary to left knee periprosthetic joint infection -Status post I&D 10/7, arthrocentesis 10/05/2023 without evidence of infection -PICC line in place -Continue cefazolin, last day of therapy 11/18   In agreement with assessment of the pressure ulcer as below:  Pressure Injury 10/23/23 Buttocks Mid Stage 2 -  Partial thickness loss of dermis presenting as a shallow open injury with a red, pink wound bed without slough. Severe redness with some skin abrasion (Active)  10/23/23 1838  Location: Buttocks  Location Orientation: Mid  Staging: Stage 2 -  Partial thickness loss of dermis presenting as a shallow open injury with a red, pink wound bed without slough.  Wound Description (Comments): Severe redness with some skin abrasion  Present on Admission: Yes  Dressing Type Foam - Lift dressing to assess site every shift 10/24/23 1000         DVT prophylaxis: On hold due to supratherapeutic INR  Code Status: DNR, no CPR, may intubate if has pulse Family Communication: Discussed with daughter Jacki Cones over the phone.  We discussed patient's recent hospitalizations and health and cognitive decline.  She is agreeable to having a family meeting with palliative care medicine to discuss goals of care. Disposition Plan: SNF Status is: Observation The patient will require care spanning > 2 midnights and  should be moved to inpatient because: Encephalopathy    Antimicrobials:  Anti-infectives (From admission, onward)    Start     Dose/Rate Route Frequency Ordered Stop   10/23/23 1400  ceFAZolin (ANCEF) IVPB  Status:   Discontinued       Note to Pharmacy: Indication:  MSSA L-knee PJI First Dose: Yes Last Day of Therapy:  11/09/23 Labs - Once weekly:  CBC/D and BMP, Labs - Once weekly: ESR and CRP   2 g Intravenous Every 8 hours 10/23/23 1117 10/23/23 1124   10/23/23 1130  ceFAZolin (ANCEF) IVPB 2g/100 mL premix        2 g 200 mL/hr over 30 Minutes Intravenous Every 8 hours 10/23/23 1124 11/10/23 0559        Objective: Vitals:   10/23/23 2119 10/24/23 0212 10/24/23 0500 10/24/23 1159  BP: (!) 158/83 (!) 127/58  (!) 176/55  Pulse: (!) 101 71    Resp: (!) 22 20    Temp: 97.8 F (36.6 C) 98 F (36.7 C)    TempSrc: Oral Axillary    SpO2: 97% 98%    Weight:   86 kg   Height:        Intake/Output Summary (Last 24 hours) at 10/24/2023 1159 Last data filed at 10/23/2023 2331 Gross per 24 hour  Intake 213.72 ml  Output --  Net 213.72 ml   Filed Weights   10/23/23 0444 10/24/23 0500  Weight: 86 kg 86 kg    Examination:  General exam: Appears confused, disoriented Respiratory system: Clear to auscultation. Respiratory effort normal Cardiovascular system: S1 & S2 heard. No murmurs Gastrointestinal system: Abdomen is nondistended, soft  Central nervous system: Alert but not able to answer questions appropriately Extremities: Symmetric in appearance   Data Reviewed: I have personally reviewed following labs and imaging studies  CBC: Recent Labs  Lab 10/23/23 0519 10/24/23 0402  WBC 7.4 7.8  NEUTROABS 5.8  --   HGB 8.9* 7.7*  HCT 28.3* 25.0*  MCV 97.6 100.0  PLT 245 214   Basic Metabolic Panel: Recent Labs  Lab 10/23/23 0519 10/23/23 0746 10/24/23 0402  NA 139  --  141  K 3.2*  --  3.0*  CL 100  --  100  CO2 26  --  28  GLUCOSE 150*  --  137*  BUN 31*  --  37*  CREATININE 1.34*  --  1.44*  CALCIUM 8.4*  --  8.1*  MG  --  2.1  --   PHOS  --  4.8*  --    GFR: Estimated Creatinine Clearance: 38.7 mL/min (A) (by C-G formula based on SCr of 1.44 mg/dL (H)). Liver Function  Tests: Recent Labs  Lab 10/23/23 0519 10/24/23 0402  AST 22 19  ALT <5 5  ALKPHOS 111 85  BILITOT 1.0 0.8  PROT 6.7 5.9*  ALBUMIN 2.6* 2.4*   No results for input(s): "LIPASE", "AMYLASE" in the last 168 hours. No results for input(s): "AMMONIA" in the last 168 hours. Coagulation Profile: Recent Labs  Lab 10/23/23 0519 10/24/23 0402  INR 8.9* 10.4*   Cardiac Enzymes: No results for input(s): "CKTOTAL", "CKMB", "CKMBINDEX", "TROPONINI" in the last 168 hours. BNP (last 3 results) No results for input(s): "PROBNP" in the last 8760 hours. HbA1C: No results for input(s): "HGBA1C" in the last 72 hours. CBG: No results for input(s): "GLUCAP" in the last 168 hours. Lipid Profile: No results for input(s): "CHOL", "HDL", "LDLCALC", "TRIG", "CHOLHDL", "LDLDIRECT" in the last 72 hours.  Thyroid Function Tests: No results for input(s): "TSH", "T4TOTAL", "FREET4", "T3FREE", "THYROIDAB" in the last 72 hours. Anemia Panel: No results for input(s): "VITAMINB12", "FOLATE", "FERRITIN", "TIBC", "IRON", "RETICCTPCT" in the last 72 hours. Sepsis Labs: No results for input(s): "PROCALCITON", "LATICACIDVEN" in the last 168 hours.  Recent Results (from the past 240 hour(s))  Culture, blood (routine x 2)     Status: None (Preliminary result)   Collection Time: 10/23/23  5:25 AM   Specimen: BLOOD  Result Value Ref Range Status   Specimen Description   Final    BLOOD BLOOD LEFT HAND Performed at [facility], [address], [place], [place] [address]    Special Requests   Final    BOTTLES DRAWN AEROBIC AND ANAEROBIC Blood Culture adequate volume Performed at [facility], [address], [place], [place] [address]    Culture   Final    NO GROWTH < 24 HOURS Performed at [facility], [address], [place], [place] [address]    Report Status PENDING  Incomplete  Culture, blood (routine x 2)     Status: None (Preliminary result)   Collection  Time: 10/23/23  5:29 AM   Specimen: BLOOD  Result Value Ref Range Status   Specimen Description   Final    BLOOD BLOOD LEFT FOREARM Performed at [facility], [address], [place], [place] [address]    Special Requests   Final    BOTTLES DRAWN AEROBIC AND ANAEROBIC Blood Culture adequate volume Performed at [facility], [address] [place], [place] [address]    Culture   Final    NO GROWTH < 24 HOURS Performed at [facility], [address] [place], [place] [address]    Report Status PENDING  Incomplete      Radiology Studies: DG Chest Port 1 View  Result Date: 10/23/2023 CLINICAL DATA:  Shortness of breath. EXAM: PORTABLE CHEST 1 VIEW COMPARISON:  10/02/2023 FINDINGS: The cardio pericardial silhouette is enlarged. Bibasilar collapse/consolidative opacity evident with superimposed vascular congestion. Right PICC line tip overlies the mid SVC level, as before. Bones are diffusely demineralized. Telemetry leads overlie the chest. IMPRESSION: Interval development of bibasilar collapse/consolidative opacity with superimposed vascular congestion. Pneumonia not excluded. Electronically Signed   By: Kennith Center M.D.   On: 10/23/2023 06:22   CT Head Wo Contrast  Result Date: 10/23/2023 CLINICAL DATA:  For give full. Mental status changes unknown cause. Cervical spine trauma. EXAM: CT HEAD WITHOUT CONTRAST CT CERVICAL SPINE WITHOUT CONTRAST TECHNIQUE: Multidetector CT imaging of the head and cervical spine was performed following the standard protocol without intravenous contrast. Multiplanar CT image reconstructions of the cervical spine were also generated. RADIATION DOSE REDUCTION: This exam was performed according to the departmental dose-optimization program which includes automated exposure control, adjustment of the mA and/or kV according to patient size and/or use of iterative reconstruction technique. COMPARISON:  09/09/2023 FINDINGS:  CT HEAD FINDINGS Brain: No evidence of acute infarction, hemorrhage, hydrocephalus, extra-axial collection or mass lesion/mass effect. Generalized brain atrophy. Vascular: Marked enlargement and elongation/tortuosity of major intracranial vessels, which are also atheromatous. This is likely from chronic hypertension and stable. Skull: No acute fracture. Sinuses/Orbits: No visible injury. CT CERVICAL SPINE FINDINGS Alignment: Normal. Skull base and vertebrae: No acute fracture. No primary bone lesion or focal pathologic process. Generalized atrophy. Soft tissues and spinal canal: No prevertebral fluid or swelling. No visible canal hematoma. Disc levels: Generalized degenerative endplate and  facet spurring. C3-4 ankylosis. C5-C7 ankylosis. Upper chest: Layering pleural effusions. There is dedicated chest x-ray pending. IMPRESSION: No evidence of acute intracranial or cervical spine injury. Electronically Signed   By: Tiburcio Pea M.D.   On: 10/23/2023 05:55   CT Cervical Spine Wo Contrast  Result Date: 10/23/2023 CLINICAL DATA:  For give full. Mental status changes unknown cause. Cervical spine trauma. EXAM: CT HEAD WITHOUT CONTRAST CT CERVICAL SPINE WITHOUT CONTRAST TECHNIQUE: Multidetector CT imaging of the head and cervical spine was performed following the standard protocol without intravenous contrast. Multiplanar CT image reconstructions of the cervical spine were also generated. RADIATION DOSE REDUCTION: This exam was performed according to the departmental dose-optimization program which includes automated exposure control, adjustment of the mA and/or kV according to patient size and/or use of iterative reconstruction technique. COMPARISON:  09/09/2023 FINDINGS: CT HEAD FINDINGS Brain: No evidence of acute infarction, hemorrhage, hydrocephalus, extra-axial collection or mass lesion/mass effect. Generalized brain atrophy. Vascular: Marked enlargement and elongation/tortuosity of major intracranial  vessels, which are also atheromatous. This is likely from chronic hypertension and stable. Skull: No acute fracture. Sinuses/Orbits: No visible injury. CT CERVICAL SPINE FINDINGS Alignment: Normal. Skull base and vertebrae: No acute fracture. No primary bone lesion or focal pathologic process. Generalized atrophy. Soft tissues and spinal canal: No prevertebral fluid or swelling. No visible canal hematoma. Disc levels: Generalized degenerative endplate and facet spurring. C3-4 ankylosis. C5-C7 ankylosis. Upper chest: Layering pleural effusions. There is dedicated chest x-ray pending. IMPRESSION: No evidence of acute intracranial or cervical spine injury. Electronically Signed   By: Tiburcio Pea M.D.   On: 10/23/2023 05:55      Scheduled Meds:  Chlorhexidine Gluconate Cloth  6 each Topical Daily   furosemide  20 mg Intravenous Daily   metoprolol tartrate  2.5 mg Intravenous Q6H   sodium chloride flush  10-40 mL Intracatheter Q12H   Continuous Infusions:   ceFAZolin (ANCEF) IV 2 g (10/24/23 1610)   potassium chloride 10 mEq (10/24/23 1049)     LOS: 0 days   Time spent: 45 minutes   Noralee Stain, DO [facility] 10/24/2023, 11:59 AM   Available via Epic secure chat 7am-7pm After these hours, please refer to coverage provider listed on [email or web address]

## 2023-10-25 DIAGNOSIS — I5033 Acute on chronic diastolic (congestive) heart failure: Secondary | ICD-10-CM | POA: Diagnosis not present

## 2023-10-25 LAB — GLUCOSE, CAPILLARY
Glucose-Capillary: 147 mg/dL — ABNORMAL HIGH (ref 70–99)
Glucose-Capillary: 118 mg/dL — ABNORMAL HIGH (ref 70–99)
Glucose-Capillary: 128 mg/dL — ABNORMAL HIGH (ref 70–99)
Glucose-Capillary: 122 mg/dL — ABNORMAL HIGH (ref 70–99)

## 2023-10-25 LAB — BASIC METABOLIC PANEL
Anion gap: 8 (ref 5–15)
BUN: 40 mg/dL — ABNORMAL HIGH (ref 8–23)
CO2: 30 mmol/L (ref 22–32)
Calcium: 8 mg/dL — ABNORMAL LOW (ref 8.9–10.3)
Chloride: 104 mmol/L (ref 98–111)
Creatinine, Ser: 1.35 mg/dL — ABNORMAL HIGH (ref 0.61–1.24)
GFR, Estimated: 51 mL/min — ABNORMAL LOW (ref >60)
Glucose, Bld: 132 mg/dL — ABNORMAL HIGH (ref 70–99)
Potassium: 4 mmol/L (ref 3.5–5.1)
Sodium: 142 mmol/L (ref 135–145)

## 2023-10-25 LAB — PROTIME-INR
INR: 2.3 — ABNORMAL HIGH (ref 0.8–1.2)
Prothrombin Time: 25.6 s — ABNORMAL HIGH (ref 11.4–15.2)

## 2023-10-25 NOTE — Evaluation (Signed)
Physical Therapy Evaluation Patient Details Name: Mathew Simpson MRN: [id number] DOB: [birth year] Today's Date: 10/25/2023  History of Present Illness  87 y.o. male admitted 10/23/23 with acute on chronic CHF and acute metabolic encephalopathy, delirium.  CXR 11/1: "Interval development of bibasilar collapse/consolidative opacity with superimposed vascular congestion. Pneumonia not excluded."  Head CT 11/1 with no acute findings.  Pt with recent past admissions to [facility] 10/11 with a fever. Recently discharged to [facility] on 10/10 after being admitted for over a month after a fall with ICH on 9/17. 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  Clinical Impression  Pt admitted with above diagnosis.  Pt currently with functional limitations due to the deficits listed below (see PT Problem List). Pt will benefit from acute skilled PT to increase their independence and safety with mobility to allow discharge.  Pt somnolent but appeared agreeable to sit EOB. Pt requiring extensive assist at this time for bed mobility.  Pt then wished to return to supine after sitting very briefly.  Pt inconsistent with following commands and only provided one word responses to questions.  Recommend pt return to previous venue to continue therapy efforts.         If plan is discharge home, recommend the following: Two people to help with walking and/or transfers;Two people to help with bathing/dressing/bathroom;Assist for transportation;Help with stairs or ramp for entrance;Assistance with cooking/housework   Can travel by private vehicle   No    Equipment Recommendations Other (comment) (defer to next venue)  Recommendations for Other Services       Functional Status Assessment Patient has had a recent decline in their functional status and demonstrates the  ability to make significant improvements in function in a reasonable and predictable amount of time.     Precautions / Restrictions Precautions Precautions: Fall Precaution Comments: Delirium. Hand mitts Restrictions LLE Weight Bearing: Weight bearing as tolerated      Mobility  Bed Mobility Overal bed mobility: Needs Assistance Bed Mobility: Sit to Supine, Supine to Sit     Supine to sit: Max assist, +2 for physical assistance Sit to supine: Mod assist, +2 for physical assistance   General bed mobility comments: assist for upper and lower body to sit EOB, assist for LEs onto bed and repositioning once in supine    Transfers                   General transfer comment: pt keeping eyes closed, declined sitting EOB very long    Ambulation/Gait                  Stairs            Wheelchair Mobility     Tilt Bed    Modified Rankin (Stroke Patients Only)       Balance Overall balance assessment: Needs assistance Sitting-balance support: Feet supported, No upper extremity supported Sitting balance-Leahy Scale: Zero Sitting balance - Comments: pt not attempting to improve sitting posture or assist level Postural control: Posterior lean                                   Pertinent Vitals/Pain Pain Assessment Pain Assessment: Faces Faces Pain Scale: Hurts a little bit Pain Location: not stating location of pain, occasional grimacing Pain  Intervention(s): Repositioned, Monitored during session    Home Living Family/patient expects to be discharged to:: Skilled nursing facility                   Additional Comments: Now at [facility] for Rehab. Has been at [facility] with multiple past hospitalizations.    Prior Function Prior Level of Function : Patient poor historian/Family not available                     Extremity/Trunk Assessment        Lower Extremity Assessment Lower Extremity Assessment:  Generalized weakness;LLE deficits/detail;Difficult to assess due to impaired cognition LLE Deficits / Details: Limited knee flexion,darker skin and/or ecchymosis anterior left knee surrounding incision scar observed       Communication   Communication Communication: Difficulty following commands/understanding;Difficulty communicating thoughts/reduced clarity of speech  Cognition Arousal: Lethargic, Suspect due to medications   Overall Cognitive Status: No family/caregiver present to determine baseline cognitive functioning                   Orientation Level: Disoriented to, Person, Place, Time, Situation (stated first name only)     Following Commands: Follows one step commands inconsistently Safety/Judgement: Decreased awareness of safety, Decreased awareness of deficits   Problem Solving: Slow processing, Decreased initiation, Difficulty sequencing, Requires verbal cues General Comments: pt agreeable to sit EOB however upon assist he wished to return to bed; pt slow to respond but also inconsistently following commands today; lethargy limiting        General Comments      Exercises     Assessment/Plan    PT Assessment Patient needs continued PT services  PT Problem List Decreased strength;Decreased balance;Decreased cognition;Decreased mobility;Decreased activity tolerance;Decreased knowledge of use of DME;Decreased coordination       PT Treatment Interventions DME instruction;Functional mobility training;Balance training;Patient/family education;Therapeutic activities;Gait training;Therapeutic exercise;Cognitive remediation    PT Goals (Current goals can be found in the Care Plan section)  Acute Rehab PT Goals PT Goal Formulation: Patient unable to participate in goal setting Time For Goal Achievement: 11/08/23 Potential to Achieve Goals: Fair    Frequency Min 1X/week     Co-evaluation               AM-PAC PT "6 Clicks" Mobility  Outcome Measure  Help needed turning from your back to your side while in a flat bed without using bedrails?: A Lot Help needed moving from lying on your back to sitting on the side of a flat bed without using bedrails?: Total Help needed moving to and from a bed to a chair (including a wheelchair)?: Total Help needed standing up from a chair using your arms (e.g., wheelchair or bedside chair)?: Total Help needed to walk in hospital room?: Total Help needed climbing 3-5 steps with a railing? : Total 6 Click Score: 7    End of Session Equipment Utilized During Treatment: Gait belt Activity Tolerance: Other (comment) (limited by cognition) Patient left: with call bell/phone within reach;with bed alarm set;in bed Nurse Communication: Mobility status PT Visit Diagnosis: Other abnormalities of gait and mobility (R26.89);Muscle weakness (generalized) (M62.81)    Time: 1100-1110 PT Time Calculation (min) (ACUTE ONLY): 10 min   Charges:   PT Evaluation $PT Eval Low Complexity: 1 Low   PT General Charges $$ ACUTE PT VISIT: 1 Visit       Thomasene Mohair PT, DPT Physical Therapist Acute Rehabilitation Services Office: [phone]   Mathew Simpson 10/25/2023,  1:38 PM

## 2023-10-25 NOTE — Plan of Care (Signed)
?  Problem: Coping: ?Goal: Level of anxiety will decrease ?Outcome: Progressing ?  ?Problem: Safety: ?Goal: Ability to remain free from injury will improve ?Outcome: Progressing ?  ?

## 2023-10-25 NOTE — TOC Initial Note (Signed)
Transition of Care [facility]) - Initial/Assessment Note    Patient Details  Name: Mathew Simpson MRN: [id number] Date of Birth: 05/19/1936  [facility]) CM/SW Contact:    Darleene Cleaver, LCSW Phone Number: 10/25/2023, 3:38 PM  Clinical Narrative:                  CSW spoke to patient's daughter Jacki Cones via phone [phone], due to patient not being alert and oriented in order to complete assessment.  Patient has been at [facility] for short term rehab.  Per patient's daughter they are having a meeting with palliative to discuss goals of care.  TOC to continue to follow patient's progress throughout discharge planning.  Expected Discharge Plan: Skilled Nursing Facility Barriers to Discharge: Continued Medical Work up   Patient Goals and CMS Choice Patient states their goals for this hospitalization and ongoing recovery are:: Per daughter either to go to SNF or possibly hospice services. CMS Medicare.gov Compare Post Acute Care list provided to:: Patient Represenative (must comment) (Patient's daughter) Choice offered to / list presented to : Adult Children [place] ownership interest in [facility].provided to:: Adult Children    Expected Discharge Plan and Services     Post Acute Care Choice: Skilled Nursing Facility, Hospice Living arrangements for the past 2 months: Single Family Home, Skilled Nursing Facility                                      Prior Living Arrangements/Services Living arrangements for the past 2 months: Single Family Home, Skilled Nursing Facility Lives with:: Adult Children Patient language and need for interpreter reviewed:: Yes Do you feel safe going back to the place where you live?: No   Per patient's daughter plan is either hospice verse SNF.  Need for Family Participation in Patient Care: Yes (Comment) Care giver support system in place?: Yes (comment)   Criminal Activity/Legal Involvement Pertinent to Current  Situation/Hospitalization: No - Comment as needed  Activities of Daily Living   ADL Screening (condition at time of admission) Independently performs ADLs?: No Does the patient have a NEW difficulty with bathing/dressing/toileting/self-feeding that is expected to last >3 days?: Yes (Initiates electronic notice to provider for possible OT consult) Does the patient have a NEW difficulty with getting in/out of bed, walking, or climbing stairs that is expected to last >3 days?: Yes (Initiates electronic notice to provider for possible PT consult) Does the patient have a NEW difficulty with communication that is expected to last >3 days?: Yes (Initiates electronic notice to provider for possible SLP consult) Is the patient deaf or have difficulty hearing?: Yes Does the patient have difficulty seeing, even when wearing glasses/contacts?: Yes Does the patient have difficulty concentrating, remembering, or making decisions?: Yes  Permission Sought/Granted Permission sought to share information with : Case Manager, Magazine features editor, Family Supports Permission granted to share information with : Yes, Release of Information Signed, Yes, Verbal Permission Granted  Share Information with NAME: [facility] Daughter [phone]  [phone]  Permission granted to share info w AGENCY: SNF admissions        Emotional Assessment Appearance:: Appears stated age   Affect (typically observed): Calm, Appropriate, Accepting, Stable Orientation: : Fluctuating Orientation (Suspected and/or reported Sundowners) Alcohol / Substance Use: Not Applicable Psych Involvement: No (comment)  Admission diagnosis:  Acute on chronic diastolic congestive heart failure (HCC) [I50.33] Acute congestive heart  failure, unspecified heart failure type (HCC) [I50.9] Acute on chronic diastolic (congestive) heart failure (HCC) [I50.33] Patient Active Problem List   Diagnosis Date Noted   Acute metabolic  encephalopathy 10/24/2023   Pressure injury of skin 10/24/2023   Acute on chronic diastolic (congestive) heart failure (HCC) 10/24/2023   Acute on chronic diastolic congestive heart failure (HCC) 10/23/2023   Prolonged QT interval 10/23/2023   Normocytic anemia 10/23/2023   Elevated serum creatinine 10/23/2023   Supratherapeutic INR 10/23/2023   Infection of prosthetic left knee joint (HCC) 10/06/2023   Septic joint (HCC) 10/03/2023   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   Staphylococcus aureus bacteremia 09/23/2023   Effusion of left knee 09/23/2023   SDH (subdural hematoma) (HCC) 09/17/2023   Bradycardia 09/16/2023   Protein-calorie malnutrition, moderate (HCC) 09/16/2023   Scalp laceration 09/12/2023   Fall 09/10/2023   Bleeding in head following injury with loss of consciousness (HCC) 09/08/2023   Subdural hematoma (HCC) 09/08/2023   DM2 (diabetes mellitus, type 2) (HCC) 09/08/2023   Neck injury 09/08/2023   Cognitive communication deficit 07/29/2022   Headache, unspecified 07/23/2022   Malnutrition of moderate degree 07/18/2022   Cellulitis of left foot 07/17/2022   Sepsis due to cellulitis (HCC) 07/17/2022   Chronic atrial fibrillation (HCC) 07/17/2022   Hypokalemia 07/17/2022   Mild protein malnutrition (HCC) 07/17/2022   Hyperbilirubinemia 07/17/2022   Primary osteoarthritis of right knee 03/12/2020   Cellulitis of left lower extremity    Septic joint of left knee joint (HCC) 09/15/2019   Acute blood loss anemia 09/15/2019   Essential hypertension 09/15/2019   OA (osteoarthritis) of knee 09/12/2019   PCP:  [name], PA-C Pharmacy:   [facility] [place], [place] - [address] [place] [address] Phone: [phone] Fax: [phone]     Social Determinants of Health (SDOH) Social History: SDOH Screenings   Food Insecurity: No Food Insecurity (10/23/2023)  Housing: Low Risk  (10/23/2023)   Transportation Needs: No Transportation Needs (10/23/2023)  Utilities: Not At Risk (10/23/2023)  Tobacco Use: Medium Risk (10/23/2023)   SDOH Interventions:     Readmission Risk Interventions    10/25/2023    3:34 PM 07/23/2022   11:40 AM 07/21/2022   10:11 AM  Readmission Risk Prevention Plan  Post Dischage Appt  Complete Complete  Medication Screening  Complete Complete  Transportation Screening Complete Complete Complete  Medication Review Oceanographer) Referral to Pharmacy    PCP or Specialist appointment within 3-5 days of discharge Complete    HRI or Home Care Consult Not Complete    HRI or Home Care Consult Pt Refusal Comments Patient plans to return to SNF for short term rehab.    SW Recovery Care/Counseling Consult Complete    Palliative Care Screening Complete    Skilled Nursing Facility Complete

## 2023-10-25 NOTE — Progress Notes (Signed)
PROGRESS NOTE    Mathew Simpson  YNW:[id number] DOB: April 18, 1936 DOA: 10/23/2023 PCP: [name], PA-C     Brief Narrative:  Mathew Simpson is a 87 y.o. male with medical history significant of osteoarthritis, chronic atrial fibrillation, hypertension, glaucoma, hyperbilirubinemia, subdural hematoma, protein malnutrition, infection of prosthetic left knee joint who was brought to the emergency department via EMS due to dyspnea and hypoxia.   Pulse ox reading at SNF was low and required nasal cannula O2.  He was disoriented but was able to answer simple questions at time of admission.  CT head and CT cervical spine was unremarkable.  Chest x-ray showed interval development of bibasilar collapse/consolidative opacity with superimposed vascular congestion.  Patient was admitted for suspected CHF exacerbation and given IV Lasix.  New events last 24 hours / Subjective: Somnolent this morning.  He received Seroquel and melatonin last night as well as Ativan early this morning.  Assessment & Plan:   Principal Problem:   Acute on chronic diastolic congestive heart failure (HCC) Active Problems:   Essential hypertension   Chronic atrial fibrillation (HCC)   Hypokalemia   DM2 (diabetes mellitus, type 2) (HCC)   Protein-calorie malnutrition, moderate (HCC)   Prolonged QT interval   Normocytic anemia   Elevated serum creatinine   Supratherapeutic INR   Acute metabolic encephalopathy   Pressure injury of skin   Acute on chronic diastolic (congestive) heart failure (HCC)   Acute metabolic encephalopathy, delirium -CT head unremarkable -On review of discharge summary 10/01/2023, patient did have some hospital acquired intermittent delirium and was continued on melatonin and Seroquel at night -Continue melatonin, Seroquel nightly -Delirium precaution -Stop IV Ativan  Acute on chronic diastolic CHF -BNP 1817 -Echo 09/21/23 - EF 55-60%  AKI -Baseline creatinine 0.88 -Will hold further  doses of IV Lasix -Monitor closely  Hypertension -Diltiazem on hold as he is refusing PO med -Metoprolol IV   Chronic A-fib -Eliquis on hold due to supratherapeutic INR -S/p IV vit K due to INR > 10 --> INR 2.3 this morning  Diabetes mellitus -A1c 6.7 -SSI   Recent MSSA bacteremia secondary to left knee periprosthetic joint infection -Status post I&D 10/7, arthrocentesis 10/05/2023 without evidence of infection -PICC line in place -Continue cefazolin, last day of therapy 11/18   In agreement with assessment of the pressure ulcer as below:  Pressure Injury 10/23/23 Buttocks Mid Stage 2 -  Partial thickness loss of dermis presenting as a shallow open injury with a red, pink wound bed without slough. Severe redness with some skin abrasion (Active)  10/23/23 1838  Location: Buttocks  Location Orientation: Mid  Staging: Stage 2 -  Partial thickness loss of dermis presenting as a shallow open injury with a red, pink wound bed without slough.  Wound Description (Comments): Severe redness with some skin abrasion  Present on Admission: Yes  Dressing Type Foam - Lift dressing to assess site every shift 10/25/23 0457         DVT prophylaxis: On hold due to supratherapeutic INR Code Status: DNR, no CPR, may intubate if has pulse Family Communication: Discussed with daughter Jacki Cones over the phone 11/2.  We discussed patient's recent hospitalizations and health and cognitive decline.  She is agreeable to having a family meeting with palliative care medicine to discuss goals of care. Disposition Plan: SNF Status is: Inpatient Remains inpatient appropriate because: Delirium      Antimicrobials:  Anti-infectives (From admission, onward)    Start     Dose/Rate  Route Frequency Ordered Stop   10/23/23 1400  ceFAZolin (ANCEF) IVPB  Status:  Discontinued       Note to Pharmacy: Indication:  MSSA L-knee PJI First Dose: Yes Last Day of Therapy:  11/09/23 Labs - Once weekly:  CBC/D and  BMP, Labs - Once weekly: ESR and CRP   2 g Intravenous Every 8 hours 10/23/23 1117 10/23/23 1124   10/23/23 1130  ceFAZolin (ANCEF) IVPB 2g/100 mL premix        2 g 200 mL/hr over 30 Minutes Intravenous Every 8 hours 10/23/23 1124 11/10/23 0559        Objective: Vitals:   10/25/23 0003 10/25/23 0426 10/25/23 0500 10/25/23 0812  BP: 139/65 (!) 143/71  (!) 140/64  Pulse: 78 82  63  Resp:  15  16  Temp:  97.7 F (36.5 C)  97.8 F (36.6 C)  TempSrc:  Oral  Axillary  SpO2:  97%  96%  Weight:   79.7 kg   Height:        Intake/Output Summary (Last 24 hours) at 10/25/2023 1213 Last data filed at 10/25/2023 0457 Gross per 24 hour  Intake 692.87 ml  Output 403 ml  Net 289.87 ml   Filed Weights   10/23/23 0444 10/24/23 0500 10/25/23 0500  Weight: 86 kg 86 kg 79.7 kg    Examination:  General exam: Somnolent Respiratory system: Clear to auscultation. Respiratory effort normal Cardiovascular system: S1 & S2 heard. No murmurs Gastrointestinal system: Abdomen is nondistended, soft  Extremities: Symmetric in appearance   Data Reviewed: I have personally reviewed following labs and imaging studies  CBC: Recent Labs  Lab 10/23/23 0519 10/24/23 0402  WBC 7.4 7.8  NEUTROABS 5.8  --   HGB 8.9* 7.7*  HCT 28.3* 25.0*  MCV 97.6 100.0  PLT 245 214   Basic Metabolic Panel: Recent Labs  Lab 10/23/23 0519 10/23/23 0746 10/24/23 0402 10/25/23 0250  NA 139  --  141 142  K 3.2*  --  3.0* 4.0  CL 100  --  100 104  CO2 26  --  28 30  GLUCOSE 150*  --  137* 132*  BUN 31*  --  37* 40*  CREATININE 1.34*  --  1.44* 1.35*  CALCIUM 8.4*  --  8.1* 8.0*  MG  --  2.1  --   --   PHOS  --  4.8*  --   --    GFR: Estimated Creatinine Clearance: 41.3 mL/min (A) (by C-G formula based on SCr of 1.35 mg/dL (H)). Liver Function Tests: Recent Labs  Lab 10/23/23 0519 10/24/23 0402  AST 22 19  ALT <5 5  ALKPHOS 111 85  BILITOT 1.0 0.8  PROT 6.7 5.9*  ALBUMIN 2.6* 2.4*   No results  for input(s): "LIPASE", "AMYLASE" in the last 168 hours. Recent Labs  Lab 10/24/23 1148  AMMONIA 17   Coagulation Profile: Recent Labs  Lab 10/23/23 0519 10/24/23 0402 10/25/23 0250  INR 8.9* 10.4* 2.3*   Cardiac Enzymes: No results for input(s): "CKTOTAL", "CKMB", "CKMBINDEX", "TROPONINI" in the last 168 hours. BNP (last 3 results) No results for input(s): "PROBNP" in the last 8760 hours. HbA1C: No results for input(s): "HGBA1C" in the last 72 hours. CBG: Recent Labs  Lab 10/24/23 1708 10/24/23 2133 10/25/23 0734 10/25/23 1132  GLUCAP 138* 142* 147* 118*   Lipid Profile: No results for input(s): "CHOL", "HDL", "LDLCALC", "TRIG", "CHOLHDL", "LDLDIRECT" in the last 72 hours. Thyroid Function Tests: No results for  input(s): "TSH", "T4TOTAL", "FREET4", "T3FREE", "THYROIDAB" in the last 72 hours. Anemia Panel: No results for input(s): "VITAMINB12", "FOLATE", "FERRITIN", "TIBC", "IRON", "RETICCTPCT" in the last 72 hours. Sepsis Labs: No results for input(s): "PROCALCITON", "LATICACIDVEN" in the last 168 hours.  Recent Results (from the past 240 hour(s))  Culture, blood (routine x 2)     Status: None (Preliminary result)   Collection Time: 10/23/23  5:25 AM   Specimen: BLOOD  Result Value Ref Range Status   Specimen Description   Final    BLOOD BLOOD LEFT HAND Performed at [facility], [address] [place], [place] [address]    Special Requests   Final    BOTTLES DRAWN AEROBIC AND ANAEROBIC Blood Culture adequate volume Performed at [facility], [address], [place], [place] [address]    Culture   Final    NO GROWTH 2 DAYS Performed at [facility], [address], [place], [place] [address]    Report Status PENDING  Incomplete  Culture, blood (routine x 2)     Status: None (Preliminary result)   Collection Time: 10/23/23  5:29 AM   Specimen: BLOOD  Result Value Ref Range Status   Specimen Description   Final     BLOOD BLOOD LEFT FOREARM Performed at [facility], [address], [place], [place] [address]    Special Requests   Final    BOTTLES DRAWN AEROBIC AND ANAEROBIC Blood Culture adequate volume Performed at [facility], [address], [place], [place] [address]    Culture   Final    NO GROWTH 2 DAYS Performed at [facility], [address] [place], [place] [address]    Report Status PENDING  Incomplete      Radiology Studies: No results found.    Scheduled Meds:  Chlorhexidine Gluconate Cloth  6 each Topical Daily   furosemide  40 mg Intravenous Daily   insulin aspart  0-9 Units Subcutaneous TID WC   melatonin  6 mg Oral QHS   metoprolol tartrate  2.5 mg Intravenous Q6H   QUEtiapine  12.5 mg Oral QHS   sodium chloride flush  10-40 mL Intracatheter Q12H   Continuous Infusions:   ceFAZolin (ANCEF) IV 2 g (10/25/23 0506)     LOS: 1 day   Time spent: 30 minutes   Noralee Stain, DO [facility] 10/25/2023, 12:13 PM   Available via Epic secure chat 7am-7pm After these hours, please refer to coverage provider listed on [email or web address]

## 2023-10-26 ENCOUNTER — Inpatient Hospital Stay (HOSPITAL_COMMUNITY): Payer: Medicare HMO

## 2023-10-26 DIAGNOSIS — Z515 Encounter for palliative care: Secondary | ICD-10-CM

## 2023-10-26 DIAGNOSIS — E440 Moderate protein-calorie malnutrition: Secondary | ICD-10-CM | POA: Diagnosis not present

## 2023-10-26 DIAGNOSIS — G9341 Metabolic encephalopathy: Secondary | ICD-10-CM

## 2023-10-26 DIAGNOSIS — I5033 Acute on chronic diastolic (congestive) heart failure: Secondary | ICD-10-CM | POA: Diagnosis not present

## 2023-10-26 DIAGNOSIS — Z7189 Other specified counseling: Secondary | ICD-10-CM

## 2023-10-26 LAB — GLUCOSE, CAPILLARY
Glucose-Capillary: 161 mg/dL — ABNORMAL HIGH (ref 70–99)
Glucose-Capillary: 128 mg/dL — ABNORMAL HIGH (ref 70–99)

## 2023-10-26 LAB — BLOOD GAS, VENOUS
Acid-Base Excess: 7.2 mmol/L — ABNORMAL HIGH (ref 0.0–2.0)
Bicarbonate: 32.5 mmol/L — ABNORMAL HIGH (ref 20.0–28.0)
O2 Saturation: 44.6 %
Patient temperature: 37
pCO2, Ven: 49 mm[Hg] (ref 44–60)
pH, Ven: 7.43 (ref 7.25–7.43)
pO2, Ven: <31 mm[Hg] — CL (ref 32–45)

## 2023-10-26 LAB — BASIC METABOLIC PANEL
Anion gap: 16 — ABNORMAL HIGH (ref 5–15)
BUN: 46 mg/dL — ABNORMAL HIGH (ref 8–23)
CO2: 26 mmol/L (ref 22–32)
Calcium: 8.3 mg/dL — ABNORMAL LOW (ref 8.9–10.3)
Chloride: 102 mmol/L (ref 98–111)
Creatinine, Ser: 1.38 mg/dL — ABNORMAL HIGH (ref 0.61–1.24)
GFR, Estimated: 50 mL/min — ABNORMAL LOW (ref >60)
Glucose, Bld: 142 mg/dL — ABNORMAL HIGH (ref 70–99)
Potassium: 3.4 mmol/L — ABNORMAL LOW (ref 3.5–5.1)
Sodium: 144 mmol/L (ref 135–145)

## 2023-10-26 LAB — CBC
HCT: 28.6 % — ABNORMAL LOW (ref 39.0–52.0)
Hemoglobin: 8.9 g/dL — ABNORMAL LOW (ref 13.0–17.0)
MCH: 31 pg (ref 26.0–34.0)
MCHC: 31.1 g/dL (ref 30.0–36.0)
MCV: 99.7 fL (ref 80.0–100.0)
Platelets: 226 10*3/uL (ref 150–400)
RBC: 2.87 MIL/uL — ABNORMAL LOW (ref 4.22–5.81)
RDW: 17.8 % — ABNORMAL HIGH (ref 11.5–15.5)
WBC: 9.1 10*3/uL (ref 4.0–10.5)
nRBC: 1.3 % — ABNORMAL HIGH (ref 0.0–0.2)

## 2023-10-26 LAB — AMMONIA: Ammonia: 14 umol/L (ref 9–35)

## 2023-10-26 LAB — VITAMIN B12: Vitamin B-12: 733 pg/mL (ref 180–914)

## 2023-10-26 LAB — PROTIME-INR
INR: 1.9 — ABNORMAL HIGH (ref 0.8–1.2)
Prothrombin Time: 22 s — ABNORMAL HIGH (ref 11.4–15.2)

## 2023-10-26 LAB — PROCALCITONIN: Procalcitonin: <0.1 ng/mL

## 2023-10-26 LAB — T4, FREE: Free T4: 0.99 ng/dL (ref 0.61–1.12)

## 2023-10-26 LAB — LACTIC ACID, PLASMA: Lactic Acid, Venous: 1.8 mmol/L (ref 0.5–1.9)

## 2023-10-26 LAB — TSH: TSH: 0.322 u[IU]/mL — ABNORMAL LOW (ref 0.350–4.500)

## 2023-10-26 MED ORDER — KETOROLAC TROMETHAMINE 15 MG/ML IJ SOLN
15.0000 mg | Freq: Once | INTRAMUSCULAR | Status: AC
  Administered 2023-10-26: 15 mg via INTRAVENOUS
  Filled 2023-10-26: qty 1

## 2023-10-26 MED ORDER — ONDANSETRON HCL 4 MG/2ML IJ SOLN: 4.0000 mg | Freq: Four times a day (QID) | INTRAMUSCULAR | Status: DC | PRN

## 2023-10-26 MED ORDER — BISACODYL 5 MG PO TBEC: 10.0000 mg | DELAYED_RELEASE_TABLET | Freq: Two times a day (BID) | ORAL | Status: DC | PRN

## 2023-10-26 MED ORDER — BIOTENE DRY MOUTH MT LIQD: 15.0000 mL | OROMUCOSAL | Status: DC | PRN

## 2023-10-26 MED ORDER — HALOPERIDOL 0.5 MG PO TABS: 0.5000 mg | ORAL_TABLET | ORAL | Status: DC | PRN

## 2023-10-26 MED ORDER — HALOPERIDOL LACTATE 5 MG/ML IJ SOLN
0.5000 mg | INTRAMUSCULAR | Status: DC | PRN
  Administered 2023-10-27 (×3): 0.5 mg via INTRAVENOUS
  Filled 2023-10-26 (×3): qty 1

## 2023-10-26 MED ORDER — METOPROLOL TARTRATE 5 MG/5ML IV SOLN: 5.0000 mg | INTRAVENOUS | Status: DC | PRN

## 2023-10-26 MED ORDER — MELATONIN 3 MG PO TABS: 6.0000 mg | ORAL_TABLET | Freq: Every evening | ORAL | Status: DC | PRN

## 2023-10-26 MED ORDER — POLYVINYL ALCOHOL 1.4 % OP SOLN: 1.0000 [drp] | Freq: Four times a day (QID) | OPHTHALMIC | Status: DC | PRN

## 2023-10-26 MED ORDER — DOCUSATE SODIUM 100 MG PO CAPS: 100.0000 mg | ORAL_CAPSULE | Freq: Two times a day (BID) | ORAL | Status: DC

## 2023-10-26 MED ORDER — HYDRALAZINE HCL 20 MG/ML IJ SOLN: 10.0000 mg | INTRAMUSCULAR | Status: DC | PRN

## 2023-10-26 MED ORDER — DEXTROSE-SODIUM CHLORIDE 5-0.45 % IV SOLN: INTRAVENOUS | Status: DC

## 2023-10-26 MED ORDER — ONDANSETRON 4 MG PO TBDP: 4.0000 mg | ORAL_TABLET | Freq: Four times a day (QID) | ORAL | Status: DC | PRN

## 2023-10-26 MED ORDER — MORPHINE SULFATE (PF) 2 MG/ML IV SOLN
1.0000 mg | INTRAVENOUS | Status: DC | PRN
  Administered 2023-10-26: 4 mg via INTRAVENOUS
  Administered 2023-10-26 – 2023-10-27 (×3): 2 mg via INTRAVENOUS
  Filled 2023-10-26: qty 2
  Filled 2023-10-26 (×2): qty 1
  Filled 2023-10-26: qty 2

## 2023-10-26 MED ORDER — GLYCOPYRROLATE 0.2 MG/ML IJ SOLN: 0.2000 mg | INTRAMUSCULAR | Status: DC | PRN

## 2023-10-26 MED ORDER — HALOPERIDOL LACTATE 2 MG/ML PO CONC
0.5000 mg | ORAL | Status: DC | PRN
  Filled 2023-10-26: qty 5

## 2023-10-26 MED ORDER — BISACODYL 10 MG RE SUPP
10.0000 mg | Freq: Two times a day (BID) | RECTAL | Status: DC | PRN
  Administered 2023-10-26: 10 mg via RECTAL
  Filled 2023-10-26: qty 1

## 2023-10-26 MED ORDER — SORBITOL 70 % SOLN
960.0000 mL | ORAL | Status: DC | PRN
Start: 2023-10-26 — End: 2023-10-27

## 2023-10-26 MED ORDER — KETOROLAC TROMETHAMINE 15 MG/ML IJ SOLN: 15.0000 mg | Freq: Four times a day (QID) | INTRAMUSCULAR | Status: DC | PRN

## 2023-10-26 MED ORDER — GLYCOPYRROLATE 1 MG PO TABS: 1.0000 mg | ORAL_TABLET | ORAL | Status: DC | PRN

## 2023-10-26 MED ORDER — GUAIFENESIN 100 MG/5ML PO LIQD: 5.0000 mL | ORAL | Status: DC | PRN

## 2023-10-26 MED ORDER — IPRATROPIUM-ALBUTEROL 0.5-2.5 (3) MG/3ML IN SOLN: 3.0000 mL | RESPIRATORY_TRACT | Status: DC | PRN

## 2023-10-26 MED ORDER — SENNOSIDES-DOCUSATE SODIUM 8.6-50 MG PO TABS: 1.0000 | ORAL_TABLET | Freq: Every evening | ORAL | Status: DC | PRN

## 2023-10-26 NOTE — Progress Notes (Signed)
LATE ENTRY FOR 11/4 in AM   SLP Cancellation Note  Patient Details Name: Mathew Simpson MRN: [id number] DOB: 19-Jan-1936   Cancelled treatment:       Reason Eval/Treat Not Completed: Patient's level of consciousness; Patient unable to follow commands and requiring assistance with nursing when SLP entered the room. ST will continue to follow.   Marline Backbone, Senaida Lange., Speech Therapy Student   10/26/2023, 4:18 PM

## 2023-10-26 NOTE — Hospital Course (Addendum)
  Brief Narrative:  Mathew Simpson is a 87 y.o. male with medical history significant of osteoarthritis, chronic atrial fibrillation, hypertension, glaucoma, hyperbilirubinemia, subdural hematoma, protein malnutrition, infection of prosthetic left knee joint who was brought to the emergency department via EMS due to dyspnea and hypoxia.  Noted to be hypoxic at SNF.  CT head, cervical spine unremarkable.  Chest x-ray showing bibasilar crackles/consolidation.  Thought to be in CHF exacerbation due to elevated BNP started on IV diuretics.   Assessment & Plan:   Principal Problem:   Acute on chronic diastolic congestive heart failure (HCC) Active Problems:   Essential hypertension   Chronic atrial fibrillation (HCC)   Hypokalemia   DM2 (diabetes mellitus, type 2) (HCC)   Protein-calorie malnutrition, moderate (HCC)   Prolonged QT interval   Normocytic anemia   Elevated serum creatinine   Supratherapeutic INR   Acute metabolic encephalopathy   Pressure injury of skin   Acute on chronic diastolic (congestive) heart failure (HCC)     Acute metabolic encephalopathy, delirium CT head is unremarkable.  Ammonia and B12 normal. TSH low, check ft4.  I will discontinue melatonin, continue Seroquel at bedtime.  Delirium protocol.  Ativan has been discontinued.  Bladder scan 0 mL   Acute on chronic diastolic CHF -BNP 1817.  Diuretics on hold due to AKI -Echo 09/21/23 - EF 55-60%   AKI Baseline creatinine 0.8.  Lasix on hold.  Creatinine 1.38  Elevated anion gap - lactate and vbg normal. Will give IVF (concerns of dehydration)  Low back pain -Will start with getting XRays   Hypertension Refusing p.o. Cardizem.  Low-dose scheduled metoprolol.  IV as needed   Chronic A-fib Initial INR greater than 10 received vitamin K.  INR now is 2.3.  Eliquis is on hold.   Diabetes mellitus A1 C6.7.  Sliding scale and Accu-Cheks   Recent MSSA bacteremia secondary to left knee periprosthetic joint  infection -Status post I&D 10/7, arthrocentesis 10/05/2023 without evidence of infection -PICC line in place.  On cefazolin EOT 11/18   Hypokalemia - As needed repletion    DVT prophylaxis: On hold due to supratherapeutic INR Code Status: DNR, no CPR, may intubate if has pulse Family Communication: Ex wife at bedside.  Disposition Plan: SNF Status is: Inpatient  Subjective:   Grimicing but answering basic questions. Bladder can is 0ml.  Reporting of some low back pain.    Examination:  General exam: Somnolent Respiratory system: Clear to auscultation. Respiratory effort normal Cardiovascular system: S1 & S2 heard. No murmurs Gastrointestinal system: Abdomen is nondistended, soft  Extremities: Symmetric in appearance  Remains inpatient appropriate because: Delirium

## 2023-10-26 NOTE — Progress Notes (Signed)
PROGRESS NOTE    Mathew Simpson  HQI:[id number] DOB: 1936/04/30 DOA: 10/23/2023 PCP: [name], PA-C     Brief Narrative:  Mathew Simpson is a 87 y.o. male with medical history significant of osteoarthritis, chronic atrial fibrillation, hypertension, glaucoma, hyperbilirubinemia, subdural hematoma, protein malnutrition, infection of prosthetic left knee joint who was brought to the emergency department via EMS due to dyspnea and hypoxia.  Noted to be hypoxic at SNF.  CT head, cervical spine unremarkable.  Chest x-ray showing bibasilar crackles/consolidation.  Thought to be in CHF exacerbation due to elevated BNP started on IV diuretics.   Assessment & Plan:   Principal Problem:   Acute on chronic diastolic congestive heart failure (HCC) Active Problems:   Essential hypertension   Chronic atrial fibrillation (HCC)   Hypokalemia   DM2 (diabetes mellitus, type 2) (HCC)   Protein-calorie malnutrition, moderate (HCC)   Prolonged QT interval   Normocytic anemia   Elevated serum creatinine   Supratherapeutic INR   Acute metabolic encephalopathy   Pressure injury of skin   Acute on chronic diastolic (congestive) heart failure (HCC)     Acute metabolic encephalopathy, delirium CT head is unremarkable.  Ammonia and B12 normal. TSH low, check ft4.  I will discontinue melatonin, continue Seroquel at bedtime.  Delirium protocol.  Ativan has been discontinued.  Bladder scan 0 mL   Acute on chronic diastolic CHF -BNP 1817.  Diuretics on hold due to AKI -Echo 09/21/23 - EF 55-60%   AKI Baseline creatinine 0.8.  Lasix on hold.  Creatinine 1.38  Elevated anion gap - lactate and vbg normal. Will give IVF (concerns of dehydration)  Low back pain -Will start with getting XRays   Hypertension Refusing p.o. Cardizem.  Low-dose scheduled metoprolol.  IV as needed   Chronic A-fib Initial INR greater than 10 received vitamin K.  INR now is 2.3.  Eliquis is on hold.   Diabetes  mellitus A1 C6.7.  Sliding scale and Accu-Cheks   Recent MSSA bacteremia secondary to left knee periprosthetic joint infection -Status post I&D 10/7, arthrocentesis 10/05/2023 without evidence of infection -PICC line in place.  On cefazolin EOT 11/18   Hypokalemia - As needed repletion    DVT prophylaxis: On hold due to supratherapeutic INR Code Status: DNR, no CPR, may intubate if has pulse Family Communication: Ex wife at bedside.  Disposition Plan: SNF Status is: Inpatient  Subjective:   Grimicing but answering basic questions. Bladder can is 0ml.  Reporting of some low back pain.    Examination:  General exam: Somnolent Respiratory system: Clear to auscultation. Respiratory effort normal Cardiovascular system: S1 & S2 heard. No murmurs Gastrointestinal system: Abdomen is nondistended, soft  Extremities: Symmetric in appearance  Remains inpatient appropriate because: Delirium      Pressure Injury 10/23/23 Buttocks Mid Stage 2 -  Partial thickness loss of dermis presenting as a shallow open injury with a red, pink wound bed without slough. Severe redness with some skin abrasion (Active)  10/23/23 1838  Location: Buttocks  Location Orientation: Mid  Staging: Stage 2 -  Partial thickness loss of dermis presenting as a shallow open injury with a red, pink wound bed without slough.  Wound Description (Comments): Severe redness with some skin abrasion  Present on Admission: Yes     Diet Orders (From admission, onward)     Start     Ordered   10/25/23 1216  Diet heart healthy/carb modified Fluid consistency: Thin  Diet effective now  Question:  Fluid consistency:  Answer:  Thin   10/25/23 1215            Objective: Vitals:   10/25/23 2016 10/25/23 2113 10/25/23 2331 10/26/23 0500  BP: (!) 163/66 (!) 159/57 (!) 142/89 (!) 153/70  Pulse: 89 71 91 95  Resp:      Temp: 97.7 F (36.5 C)   98.3 F (36.8 C)  TempSrc: Oral   Oral  SpO2: 98%   98%  Weight:     74.9 kg  Height:        Intake/Output Summary (Last 24 hours) at 10/26/2023 1339 Last data filed at 10/26/2023 0524 Gross per 24 hour  Intake 400 ml  Output 795 ml  Net -395 ml   Filed Weights   10/24/23 0500 10/25/23 0500 10/26/23 0500  Weight: 86 kg 79.7 kg 74.9 kg    Scheduled Meds:  Chlorhexidine Gluconate Cloth  6 each Topical Daily   insulin aspart  0-9 Units Subcutaneous TID WC   metoprolol tartrate  2.5 mg Intravenous Q6H   QUEtiapine  12.5 mg Oral QHS   sodium chloride flush  10-40 mL Intracatheter Q12H   Continuous Infusions:   ceFAZolin (ANCEF) IV 2 g (10/26/23 0520)   dextrose 5 % and 0.45 % NaCl 75 mL/hr at 10/26/23 1054    Nutritional status     Body mass index is 23.69 kg/m.  Data Reviewed:   CBC: Recent Labs  Lab 10/23/23 0519 10/24/23 0402 10/26/23 1154  WBC 7.4 7.8 9.1  NEUTROABS 5.8  --   --   HGB 8.9* 7.7* 8.9*  HCT 28.3* 25.0* 28.6*  MCV 97.6 100.0 99.7  PLT 245 214 226   Basic Metabolic Panel: Recent Labs  Lab 10/23/23 0519 10/23/23 0746 10/24/23 0402 10/25/23 0250 10/26/23 0207  NA 139  --  141 142 144  K 3.2*  --  3.0* 4.0 3.4*  CL 100  --  100 104 102  CO2 26  --  28 30 26   GLUCOSE 150*  --  137* 132* 142*  BUN 31*  --  37* 40* 46*  CREATININE 1.34*  --  1.44* 1.35* 1.38*  CALCIUM 8.4*  --  8.1* 8.0* 8.3*  MG  --  2.1  --   --   --   PHOS  --  4.8*  --   --   --    GFR: Estimated Creatinine Clearance: 40.4 mL/min (A) (by C-G formula based on SCr of 1.38 mg/dL (H)). Liver Function Tests: Recent Labs  Lab 10/23/23 0519 10/24/23 0402  AST 22 19  ALT <5 5  ALKPHOS 111 85  BILITOT 1.0 0.8  PROT 6.7 5.9*  ALBUMIN 2.6* 2.4*   No results for input(s): "LIPASE", "AMYLASE" in the last 168 hours. Recent Labs  Lab 10/24/23 1148 10/26/23 1154  AMMONIA 17 14   Coagulation Profile: Recent Labs  Lab 10/23/23 0519 10/24/23 0402 10/25/23 0250 10/26/23 0207  INR 8.9* 10.4* 2.3* 1.9*   Cardiac Enzymes: No results  for input(s): "CKTOTAL", "CKMB", "CKMBINDEX", "TROPONINI" in the last 168 hours. BNP (last 3 results) No results for input(s): "PROBNP" in the last 8760 hours. HbA1C: No results for input(s): "HGBA1C" in the last 72 hours. CBG: Recent Labs  Lab 10/25/23 1132 10/25/23 1635 10/25/23 2114 10/26/23 0736 10/26/23 1159  GLUCAP 118* 128* 122* 161* 128*   Lipid Profile: No results for input(s): "CHOL", "HDL", "LDLCALC", "TRIG", "CHOLHDL", "LDLDIRECT" in the last 72 hours. Thyroid Function Tests:  Recent Labs    10/26/23 1204  TSH 0.322*   Anemia Panel: Recent Labs    10/26/23 1154  VITAMINB12 733   Sepsis Labs: Recent Labs  Lab 10/26/23 0207 10/26/23 1154  PROCALCITON <0.10  --   LATICACIDVEN  --  1.8    Recent Results (from the past 240 hour(s))  Culture, blood (routine x 2)     Status: None (Preliminary result)   Collection Time: 10/23/23  5:25 AM   Specimen: BLOOD  Result Value Ref Range Status   Specimen Description   Final    BLOOD BLOOD LEFT HAND Performed at [facility], [address], [place], [place] [address]    Special Requests   Final    BOTTLES DRAWN AEROBIC AND ANAEROBIC Blood Culture adequate volume Performed at [facility], [address] [place], [place] [address]    Culture   Final    NO GROWTH 2 DAYS Performed at [facility], [address], [place], [place] [address]    Report Status PENDING  Incomplete  Culture, blood (routine x 2)     Status: None (Preliminary result)   Collection Time: 10/23/23  5:29 AM   Specimen: BLOOD  Result Value Ref Range Status   Specimen Description   Final    BLOOD BLOOD LEFT FOREARM Performed at [facility], [address], [place], [place] [address]    Special Requests   Final    BOTTLES DRAWN AEROBIC AND ANAEROBIC Blood Culture adequate volume Performed at [facility], [address], [place], [place] [address]     Culture   Final    NO GROWTH 2 DAYS Performed at [facility], [address] [place], [place] [address]    Report Status PENDING  Incomplete         Radiology Studies: No results found.         LOS: 2 days   Time spent= 35 mins    Miguel Rota, MD [facility]  If 7PM-7AM, please contact night-coverage  10/26/2023, 1:39 PM

## 2023-10-26 NOTE — Plan of Care (Signed)
  Problem: Safety: Goal: Ability to remain free from injury will improve Outcome: Progressing   Problem: Skin Integrity: Goal: Risk for impaired skin integrity will decrease Outcome: Progressing   

## 2023-10-26 NOTE — Progress Notes (Incomplete)
PHARMACY CONSULT NOTE FOR:  OUTPATIENT  PARENTERAL ANTIBIOTIC THERAPY (OPAT)  Indication: MSSA L-knee PJI Regimen: Cefazolin 2g IV every 8 hours End date: 11/09/23 (6 weeks from OR 09/28/23)   Noted penicillin allergy however the patient tolerated an oral cephalexin challenge on 10/8 and should be able to transition to this for po suppression after IV antibiotic have been completed.   IV antibiotic discharge orders are pended. To discharging provider:  please sign these orders via discharge navigator,  Select New Orders & click on the button choice - Manage This Unsigned Work.     Thank you for allowing pharmacy to be a part of this patient's care.  Georgina Pillion, PharmD, BCPS, BCIDP Infectious Diseases Clinical Pharmacist 10/26/2023 10:02 AM   **Pharmacist phone directory can now be found on [email or web address] ([email or web address]).  Listed under [facility].

## 2023-10-26 NOTE — Consult Note (Signed)
Palliative Care Consult Note                                  Date: 10/26/2023   Patient Name: Mathew Simpson  DOB: 1936/02/16  MRN: [id number]  Age / Sex: 87 y.o., male  PCP: Ronnald Collum Referring Physician: Miguel Rota, MD  Reason for Consultation: Establishing goals of care  HPI/Patient Profile: 87 y.o. male  with past medical history of HFpEF, osteoarthritis, chronic atrial fibrillation, hypertension, glaucoma, hyper bili Ruben anemia, subdural hematoma, protein malnutrition, and infection of prosthetic left knee joint who was brought to the ED from SNF on 10/23/2023 due to dyspnea and hypoxia.  Chest x-ray showed bibasilar crackles/consolidation.  He was admitted with acute on chronic diastolic CHF.  Hospital course complicated by acute metabolic encephalopathy and delirium.  Past Medical History:  Diagnosis Date   Arthritis    back,   DJD (degenerative joint disease)    Dysrhythmia    A-fib   Glaucoma    Hypertension     Subjective:   I have reviewed medical records including EPIC notes, labs and imaging, received report from the team, and assessed the patient at bedside.   I met with *** to discuss diagnosis, prognosis, GOC, EOL wishes, disposition, and options.  I introduced Palliative Medicine as specialized medical care for people living with serious illness. It focuses on providing relief from the symptoms and stress of a serious illness.   We discussed patient's current illness and what it means in the larger context of his/her ongoing co-morbidities. Current clinical status was reviewed. Natural disease trajectory of *** was discussed.  Created space and opportunity for patient and family to explore thoughts and feelings regarding current medical situation.  Values and goals of care important to patient and family were attempted to be elicited.  A discussion was had today regarding advanced directives.  Concepts specific to code status, artifical feeding and hydration, continued IV antibiotics and rehospitalization was had.  The MOST form was introduced and discussed.  Questions and concerns addressed. Patient/family encouraged to call with questions or concerns.     Life Review: ***  Functional Status: ***  Patient/Family Understanding of Illness: ***  Patient Values: ***  Goals: ***  Additional Discussion: ***  Review of Systems  Objective:   Primary Diagnoses: Present on Admission:  Acute on chronic diastolic congestive heart failure (HCC)  Hypokalemia  Prolonged QT interval  Protein-calorie malnutrition, moderate (HCC)  Normocytic anemia  Elevated serum creatinine  Supratherapeutic INR  Essential hypertension  Chronic atrial fibrillation (HCC)  Acute on chronic diastolic (congestive) heart failure (HCC)   Physical Exam  Vital Signs:  BP (!) 156/62 (BP Location: Left Arm)   Pulse 60   Temp (!) 97.5 F (36.4 C) (Axillary)   Resp 16   Ht 5\' 10"  (1.778 m)   Wt 74.9 kg   SpO2 97%   BMI 23.69 kg/m   Palliative Assessment/Data: ***     Assessment & Plan:   SUMMARY OF RECOMMENDATIONS   Transition to full comfort measures   Primary Decision Maker: {Primary Decision FIEPP:29518}  Code Status/Advance Care Planning: {Palliative Code status:23503}  Symptom Management:  ***  Prognosis:  {Palliative Care Prognosis:23504}  Discharge Planning:  {Palliative dispostion:23505}   Discussed with: ***    Thank you for allowing [place] to participate in the care of Mathew Simpson   Time Total: ***  Greater than 50%  of this time was spent counseling and coordinating care related to the above assessment and plan.  Signed by: Sherlean Foot, NP Palliative Medicine Team  Team Phone # [phone]  For individual providers, please see AMION

## 2023-10-26 NOTE — Progress Notes (Signed)
No bipap in room 

## 2023-10-26 NOTE — TOC Progression Note (Addendum)
Transition of Care [facility]) - Progression Note    Patient Details  Name: Mathew Simpson MRN: [id number] Date of Birth: 1936/09/06  Transition of Care [facility] CM/SW Contact  Howell Rucks, RN Phone Number: 10/26/2023, 11:03 AM  Clinical Narrative:  PT eval completed, recommendation for short term rehab, NCM call to pt's dtr Chrys Racer), no answer, vm left with NCM name and phone number requesting call back.    -2:00pm Met with pt's family (ex wife) at bedside, introduced self and role of TOC/NCM, confirmed pt was recently at [facility] for short term rehab, discussed PT recommendation for short term rehab. Pt being followed by Palliative Medicine Team. NCM will continue to follow.    -2:51pm Text sent to [facility], rep-Nikki, notified pt admitted to acute inpatient, family meeting with Palliative Medicine Team to review goals of care, PT recommendation, short term rehab. Will keep Nikki updated.     Expected Discharge Plan: Skilled Nursing Facility Barriers to Discharge: Continued Medical Work up  Expected Discharge Plan and Services     Post Acute Care Choice: Skilled Nursing Facility, Hospice Living arrangements for the past 2 months: Single Family Home, Skilled Nursing Facility                                       Social Determinants of Health (SDOH) Interventions SDOH Screenings   Food Insecurity: No Food Insecurity (10/23/2023)  Housing: Low Risk  (10/23/2023)  Transportation Needs: No Transportation Needs (10/23/2023)  Utilities: Not At Risk (10/23/2023)  Tobacco Use: Medium Risk (10/23/2023)    Readmission Risk Interventions    10/25/2023    3:34 PM 07/23/2022   11:40 AM 07/21/2022   10:11 AM  Readmission Risk Prevention Plan  Post Dischage Appt  Complete Complete  Medication Screening  Complete Complete  Transportation Screening Complete Complete Complete  Medication Review (RN Futures trader) Referral to Pharmacy    PCP or Specialist appointment within 3-5  days of discharge Complete    HRI or Home Care Consult Not Complete    HRI or Home Care Consult Pt Refusal Comments Patient plans to return to SNF for short term rehab.    SW Recovery Care/Counseling Consult Complete    Palliative Care Screening Complete    Skilled Nursing Facility Complete

## 2023-10-27 DIAGNOSIS — I5033 Acute on chronic diastolic (congestive) heart failure: Secondary | ICD-10-CM | POA: Diagnosis not present

## 2023-10-27 DIAGNOSIS — R627 Adult failure to thrive: Secondary | ICD-10-CM

## 2023-10-27 MED ORDER — MORPHINE SULFATE (PF) 2 MG/ML IV SOLN
1.0000 mg | INTRAVENOUS | Status: DC | PRN
  Administered 2023-10-27: 4 mg via INTRAVENOUS
  Filled 2023-10-27: qty 2

## 2023-10-27 MED ORDER — KETOROLAC TROMETHAMINE 15 MG/ML IJ SOLN: 15.0000 mg | Freq: Four times a day (QID) | INTRAMUSCULAR | Status: DC | PRN

## 2023-10-27 MED ORDER — MORPHINE SULFATE (CONCENTRATE) 10 MG/0.5ML PO SOLN: 5.0000 mg | ORAL | Status: DC

## 2023-10-27 NOTE — Evaluation (Addendum)
  Pt now full comfort s/p palliative meeting.  Will sign off. Please re-consult if desire. Thanks.   Rolena Infante, MS [facility] SLP Acute [employer] [phone]

## 2023-10-27 NOTE — Progress Notes (Signed)
PROGRESS NOTE    [name]  WUJ:[id number] DOB: 1936/04/26 DOA: 10/23/2023 PCP: [name], PA-C     Brief Narrative:  Mathew Simpson is a 87 y.o. male with medical history significant of osteoarthritis, chronic atrial fibrillation, hypertension, glaucoma, hyperbilirubinemia, subdural hematoma, protein malnutrition, infection of prosthetic left knee joint who was brought to the emergency department via EMS due to dyspnea and hypoxia.  Noted to be hypoxic at SNF.  CT head, cervical spine unremarkable.  Chest x-ray showing bibasilar crackles/consolidation.  Thought to be in CHF exacerbation due to elevated BNP started on IV diuretics.  Overall patient's prognosis was poor given his chronic issues.  Palliative care team was consulted and patient was transition to comfort care.   Assessment & Plan:   Principal Problem:   Acute on chronic diastolic congestive heart failure (HCC) Active Problems:   Essential hypertension   Chronic atrial fibrillation (HCC)   Hypokalemia   DM2 (diabetes mellitus, type 2) (HCC)   Protein-calorie malnutrition, moderate (HCC)   Prolonged QT interval   Normocytic anemia   Elevated serum creatinine   Supratherapeutic INR   Acute metabolic encephalopathy   Pressure injury of skin   Acute on chronic diastolic (congestive) heart failure (HCC)     Hospice/comfort care -Appropriate orders are in place.  No further workup including lab, fluid.  Continue symptomatic management.  Acute metabolic encephalopathy, delirium   Acute on chronic diastolic CHF   AKI  Elevated anion gap  Low back pain   Hypertension   Chronic A-fib   Diabetes mellitus   Recent MSSA bacteremia secondary to left knee periprosthetic joint infection   Hypokalemia    Comfort care patient.  Palliative/TOC team working to hopefully get him to a hospice facility in [facility]. Called [facility] as well  Subjective: Doing ok.     Examination:  General exam:  Somnolent Respiratory system: Clear to auscultation. Respiratory effort normal Cardiovascular system: S1 & S2 heard. No murmurs Gastrointestinal system: Abdomen is nondistended, soft  Extremities: Symmetric in appearance  Remains inpatient appropriate because: Delirium       Pressure Injury 10/23/23 Buttocks Mid Stage 2 -  Partial thickness loss of dermis presenting as a shallow open injury with a red, pink wound bed without slough. Severe redness with some skin abrasion (Active)  10/23/23 1838  Location: Buttocks  Location Orientation: Mid  Staging: Stage 2 -  Partial thickness loss of dermis presenting as a shallow open injury with a red, pink wound bed without slough.  Wound Description (Comments): Severe redness with some skin abrasion  Present on Admission: Yes     Diet Orders (From admission, onward)     Start     Ordered   10/25/23 1216  Diet heart healthy/carb modified Fluid consistency: Thin  Diet effective now       Question:  Fluid consistency:  Answer:  Thin   10/25/23 1215            Objective: Vitals:   10/26/23 0500 10/26/23 1448  0344  0647  BP: (!) 153/70 (!) 156/62 (!) 158/78   Pulse: 95 60 77   Resp:   18   Temp: 98.3 F (36.8 C) (!) 97.5 F (36.4 C) 97.8 F (36.6 C)   TempSrc: Oral Axillary Oral   SpO2: 98% 97% (!) 86% (!) 89%  Weight: 74.9 kg     Height:        Intake/Output Summary (Last 24 hours) at  1119  Last data filed at  0345 Gross per 24 hour  Intake 50 ml  Output 600 ml  Net -550 ml   Filed Weights   10/24/23 0500 10/25/23 0500 10/26/23 0500  Weight: 86 kg 79.7 kg 74.9 kg    Scheduled Meds:  sodium chloride flush  10-40 mL Intracatheter Q12H   Continuous Infusions:   ceFAZolin (ANCEF) IV 2 g ( 0517)    Nutritional status     Body mass index is 23.69 kg/m.  Data Reviewed:   CBC: Recent Labs  Lab 10/23/23 0519 10/24/23 0402 10/26/23 1154  WBC 7.4 7.8 9.1  NEUTROABS  5.8  --   --   HGB 8.9* 7.7* 8.9*  HCT 28.3* 25.0* 28.6*  MCV 97.6 100.0 99.7  PLT 245 214 226   Basic Metabolic Panel: Recent Labs  Lab 10/23/23 0519 10/23/23 0746 10/24/23 0402 10/25/23 0250 10/26/23 0207  NA 139  --  141 142 144  K 3.2*  --  3.0* 4.0 3.4*  CL 100  --  100 104 102  CO2 26  --  28 30 26   GLUCOSE 150*  --  137* 132* 142*  BUN 31*  --  37* 40* 46*  CREATININE 1.34*  --  1.44* 1.35* 1.38*  CALCIUM 8.4*  --  8.1* 8.0* 8.3*  MG  --  2.1  --   --   --   PHOS  --  4.8*  --   --   --    GFR: Estimated Creatinine Clearance: 40.4 mL/min (A) (by C-G formula based on SCr of 1.38 mg/dL (H)). Liver Function Tests: Recent Labs  Lab 10/23/23 0519 10/24/23 0402  AST 22 19  ALT <5 5  ALKPHOS 111 85  BILITOT 1.0 0.8  PROT 6.7 5.9*  ALBUMIN 2.6* 2.4*   No results for input(s): "LIPASE", "AMYLASE" in the last 168 hours. Recent Labs  Lab 10/24/23 1148 10/26/23 1154  AMMONIA 17 14   Coagulation Profile: Recent Labs  Lab 10/23/23 0519 10/24/23 0402 10/25/23 0250 10/26/23 0207  INR 8.9* 10.4* 2.3* 1.9*   Cardiac Enzymes: No results for input(s): "CKTOTAL", "CKMB", "CKMBINDEX", "TROPONINI" in the last 168 hours. BNP (last 3 results) No results for input(s): "PROBNP" in the last 8760 hours. HbA1C: No results for input(s): "HGBA1C" in the last 72 hours. CBG: Recent Labs  Lab 10/25/23 1132 10/25/23 1635 10/25/23 2114 10/26/23 0736 10/26/23 1159  GLUCAP 118* 128* 122* 161* 128*   Lipid Profile: No results for input(s): "CHOL", "HDL", "LDLCALC", "TRIG", "CHOLHDL", "LDLDIRECT" in the last 72 hours. Thyroid Function Tests: Recent Labs    10/26/23 1154 10/26/23 1204  TSH  --  0.322*  FREET4 0.99  --    Anemia Panel: Recent Labs    10/26/23 1154  VITAMINB12 733   Sepsis Labs: Recent Labs  Lab 10/26/23 0207 10/26/23 1154  PROCALCITON <0.10  --   LATICACIDVEN  --  1.8    Recent Results (from the past 240 hour(s))  Culture, blood (routine x  2)     Status: None (Preliminary result)   Collection Time: 10/23/23  5:25 AM   Specimen: BLOOD  Result Value Ref Range Status   Specimen Description   Final    BLOOD BLOOD LEFT HAND Performed at [facility], [address] [place], [place] [address]    Special Requests   Final    BOTTLES DRAWN AEROBIC AND ANAEROBIC Blood Culture adequate volume Performed at [facility], [address] [place], [place] [address]    Culture   Final    NO GROWTH 3 DAYS Performed at [facility], [address] [place], [place] [address]    Report Status PENDING  Incomplete  Culture, blood (routine x 2)     Status: None (Preliminary result)   Collection Time: 10/23/23  5:29 AM   Specimen: BLOOD  Result Value Ref Range Status   Specimen Description   Final    BLOOD BLOOD LEFT FOREARM Performed at [facility], [address] [place], [place] [address]    Special Requests   Final    BOTTLES DRAWN AEROBIC AND ANAEROBIC Blood Culture adequate volume Performed at [facility], [address] [place], [place] [address]    Culture   Final    NO GROWTH 3 DAYS Performed at [facility], [address], [place], [place] [address]    Report Status PENDING  Incomplete         Radiology Studies: DG Lumbar Spine 2-3 Views  Result Date: 10/26/2023 CLINICAL DATA:  Lower back pain. EXAM: LUMBAR SPINE - 2-3 VIEW COMPARISON:  September 21, 2023. FINDINGS: Mild grade 1 retrolisthesis of L3-4 is noted secondary to moderate degenerative disc disease at this level. Moderate degenerative disc disease is also noted at L5-S1. Mild degenerative disc disease is noted at L1-2, L2-3 and L4-5. No fracture is noted. IMPRESSION: Multilevel degenerative disc disease.  No acute abnormality seen. Electronically Signed   By: Lupita Raider M.D.   On: 10/26/2023 18:24   DG Thoracic Spine 2 View  Result Date: 10/26/2023 CLINICAL  DATA:  Upper back pain. EXAM: THORACIC SPINE 2 VIEWS COMPARISON:  September 21, 2023. FINDINGS: No fracture or spondylolisthesis is noted. Mild dextroscoliosis of thoracic spine is noted. Mild to moderate osteophyte formation is seen at multiple levels of the thoracic spine. IMPRESSION: Multilevel degenerative changes are noted. No acute abnormality is noted. Electronically Signed   By: Lupita Raider M.D.   On: 10/26/2023 18:21           LOS: 3 days   Time spent= 35 mins    Miguel Rota, MD [facility]  If 7PM-7AM, please contact night-coverage  , 11:19 AM

## 2023-10-27 NOTE — TOC Progression Note (Addendum)
Transition of Care [facility]) - Progression Note    Patient Details  Name: Mathew Simpson MRN: [id number] Date of Birth: 1936-11-19  Transition of [facility]) CM/SW Contact  Howell Rucks, RN Phone Number: , 9:32 AM  Clinical Narrative:  [facility] consult for inpatient hospice,  Palliative Medicine  Team following, family agrees to transition to comfort care with transition to inpatient hospice, dtr prefers facility in [place], [facility]. NCM called to [facility] [place] [place] Drenda Freeze, referral initiated. TOC will continue to follow.   -9:50am Call from Jacki Cones (dtr), confirmed request for transition to inpatient hospice with [facility] [place] facility, informed referral made this morning, awaiting bed availability, voiced understanding. TOC will continue to follow.   -12:08pm Call received from Cheri, admissions coord with [facility], confirmed approved for inpatient hospice,  facility waiting return call from pt's dtr to complete admission paperwork.   -1:34pm Call received from [facility], reports paperwork completed by pt's dtr, bed available today for transport, team notified.   Expected Discharge Plan: Skilled Nursing Facility Barriers to Discharge: Continued Medical Work up  Expected Discharge Plan and Services     Post Acute Care Choice: Skilled Nursing Facility, Hospice Living arrangements for the past 2 months: Single Family Home, Skilled Nursing Facility                                       Social Determinants of Health (SDOH) Interventions SDOH Screenings   Food Insecurity: No Food Insecurity (10/23/2023)  Housing: Low Risk  (10/23/2023)  Transportation Needs: No Transportation Needs (10/23/2023)  Utilities: Not At Risk (10/23/2023)  Tobacco Use: Medium Risk (10/23/2023)    Readmission Risk Interventions    10/25/2023    3:34 PM 07/23/2022   11:40 AM 07/21/2022   10:11 AM  Readmission Risk Prevention Plan   Post Dischage Appt  Complete Complete  Medication Screening  Complete Complete  Transportation Screening Complete Complete Complete  Medication Review (RN Futures trader) Referral to Pharmacy    PCP or Specialist appointment within 3-5 days of discharge Complete    HRI or Home Care Consult Not Complete    HRI or Home Care Consult Pt Refusal Comments Patient plans to return to SNF for short term rehab.    SW Recovery Care/Counseling Consult Complete    Palliative Care Screening Complete    Skilled Nursing Facility Complete

## 2023-10-27 NOTE — Discharge Summary (Signed)
Physician Discharge Summary  JEMELL TOWN ONG:[id number] DOB: Jan 07, 1936 DOA: 10/23/2023  PCP: [name], PA-C  Admit date: 10/23/2023 Discharge date:   Admitted From: Home Disposition:  Hospice  Recommendations for Outpatient Follow-up:  Residential hospice  Discharge Condition: Stable CODE STATUS: DNR Diet recommendation: regular  Brief/Interim Summary:  Brief Narrative:  Mathew Simpson is a 87 y.o. male with medical history significant of osteoarthritis, chronic atrial fibrillation, hypertension, glaucoma, hyperbilirubinemia, subdural hematoma, protein malnutrition, infection of prosthetic left knee joint who was brought to the emergency department via EMS due to dyspnea and hypoxia.  Noted to be hypoxic at SNF.  CT head, cervical spine unremarkable.  Chest x-ray showing bibasilar crackles/consolidation.  Thought to be in CHF exacerbation due to elevated BNP started on IV diuretics.  Overall patient's prognosis was poor given his chronic issues.  Palliative care team was consulted and patient was transition to comfort care.   Assessment & Plan:   Principal Problem:   Acute on chronic diastolic congestive heart failure (HCC) Active Problems:   Essential hypertension   Chronic atrial fibrillation (HCC)   Hypokalemia   DM2 (diabetes mellitus, type 2) (HCC)   Protein-calorie malnutrition, moderate (HCC)   Prolonged QT interval   Normocytic anemia   Elevated serum creatinine   Supratherapeutic INR   Acute metabolic encephalopathy   Pressure injury of skin   Acute on chronic diastolic (congestive) heart failure (HCC)     Hospice/comfort care -Appropriate orders are in place.  No further workup including lab, fluid.  Continue symptomatic management.  Acute metabolic encephalopathy, delirium   Acute on chronic diastolic CHF   AKI  Elevated anion gap  Low back pain   Hypertension   Chronic A-fib   Diabetes mellitus   Recent MSSA bacteremia secondary  to left knee periprosthetic joint infection   Hypokalemia    Comfort care patient.  Palliative/TOC team working to hopefully get him to a hospice facility in [facility]. Called [facility] as well  Subjective: Doing ok.     Examination:  General exam: Somnolent Respiratory system: Clear to auscultation. Respiratory effort normal Cardiovascular system: S1 & S2 heard. No murmurs Gastrointestinal system: Abdomen is nondistended, soft  Extremities: Symmetric in appearance  Remains inpatient appropriate because: Delirium   Discharge Diagnoses:  Principal Problem:   Acute on chronic diastolic congestive heart failure (HCC) Active Problems:   Essential hypertension   Chronic atrial fibrillation (HCC)   Hypokalemia   DM2 (diabetes mellitus, type 2) (HCC)   Protein-calorie malnutrition, moderate (HCC)   Prolonged QT interval   Normocytic anemia   Elevated serum creatinine   Supratherapeutic INR   Acute metabolic encephalopathy   Pressure injury of skin   Acute on chronic diastolic (congestive) heart failure (HCC)     Discharge Exam: Vitals:    0344  0647  BP: (!) 158/78   Pulse: 77   Resp: 18   Temp: 97.8 F (36.6 C)   SpO2: (!) 86% (!) 89%   Vitals:   10/26/23 0500 10/26/23 1448  0344  0647  BP: (!) 153/70 (!) 156/62 (!) 158/78   Pulse: 95 60 77   Resp:   18   Temp: 98.3 F (36.8 C) (!) 97.5 F (36.4 C) 97.8 F (36.6 C)   TempSrc: Oral Axillary Oral   SpO2: 98% 97% (!) 86% (!) 89%  Weight: 74.9 kg     Height:         Discharge Instructions   Allergies as  of        Reactions   Penicillins Anaphylaxis, Other (See Comments)   Tolerates ancef and cephalexin Did it involve swelling of the face/tongue/throat, SOB, or low BP? Yes Did it involve sudden or severe rash/hives, skin peeling, or any reaction on the inside of your mouth or nose? Unknown Did you need to seek medical attention at a hospital or doctor's  office? Yes When did it last happen?      50+ years If all above answers are "NO", may proceed with cephalosporin use.        Medication List     STOP taking these medications    acetaminophen 325 MG tablet Commonly known as: TYLENOL   barrier cream Crea Commonly known as: non-specified   bisacodyl 10 MG suppository Commonly known as: DULCOLAX   CALCIUM + D PO   ceFAZolin IVPB Commonly known as: ANCEF   diltiazem 120 MG 24 hr capsule Commonly known as: CARDIZEM CD   docusate sodium 100 MG capsule Commonly known as: COLACE   Eliquis 5 MG Tabs tablet Generic drug: apixaban   EPINEPHrine 0.3 mg/0.3 mL Soaj injection Commonly known as: EPI-PEN   Gerhardt's butt cream Crea   HYDROcodone-acetaminophen 5-325 MG tablet Commonly known as: NORCO/VICODIN   magnesium hydroxide 400 MG/5ML suspension Commonly known as: MILK OF MAGNESIA   melatonin 3 MG Tabs tablet   metoprolol tartrate 25 MG tablet Commonly known as: LOPRESSOR   multivitamin with minerals Tabs tablet   NON FORMULARY   NON FORMULARY   NORMAL SALINE FLUSH IV   nystatin powder   ondansetron 4 MG tablet Commonly known as: ZOFRAN   polyethylene glycol 17 g packet Commonly known as: MIRALAX / GLYCOLAX   QUEtiapine 25 MG tablet Commonly known as: SEROQUEL        Follow-up Information     [name], PA-C Follow up in 1 week(s).   Specialty: Cardiology Contact information: [facility]  [address] [place] [address] [phone]                Allergies  Allergen Reactions   Penicillins Anaphylaxis and Other (See Comments)    Tolerates ancef and cephalexin  Did it involve swelling of the face/tongue/throat, SOB, or low BP? Yes Did it involve sudden or severe rash/hives, skin peeling, or any reaction on the inside of your mouth or nose? Unknown Did you need to seek medical attention at a hospital or doctor's office? Yes When did it last happen?       50+ years If all above answers are "NO", may proceed with cephalosporin use.     You were cared for by a hospitalist during your hospital stay. If you have any questions about your discharge medications or the care you received while you were in the hospital after you are discharged, you can call the unit and asked to speak with the hospitalist on call if the hospitalist that took care of you is not available. Once you are discharged, your primary care physician will handle any further medical issues. Please note that no refills for any discharge medications will be authorized once you are discharged, as it is imperative that you return to your primary care physician (or establish a relationship with a primary care physician if you do not have one) for your aftercare needs so that they can reassess your need for medications and monitor your lab values.  You were cared for by a hospitalist during your  hospital stay. If you have any questions about your discharge medications or the care you received while you were in the hospital after you are discharged, you can call the unit and asked to speak with the hospitalist on call if the hospitalist that took care of you is not available. Once you are discharged, your primary care physician will handle any further medical issues. Please note that NO REFILLS for any discharge medications will be authorized once you are discharged, as it is imperative that you return to your primary care physician (or establish a relationship with a primary care physician if you do not have one) for your aftercare needs so that they can reassess your need for medications and monitor your lab values.  Please request your Prim.MD to go over all Hospital Tests and Procedure/Radiological results at the follow up, please get all Hospital records sent to your Prim MD by signing hospital release before you go home.  Get CBC, CMP, 2 view Chest X ray checked  by Primary MD during your next  visit or SNF MD in 5-7 days ( we routinely change or add medications that can affect your baseline labs and fluid status, therefore we recommend that you get the mentioned basic workup next visit with your PCP, your PCP may decide not to get them or add new tests based on their clinical decision)  On your next visit with your primary care physician please Get Medicines reviewed and adjusted.  If you experience worsening of your admission symptoms, develop shortness of breath, life threatening emergency, suicidal or homicidal thoughts you must seek medical attention immediately by calling 911 or calling your MD immediately  if symptoms less severe.  You Must read complete instructions/literature along with all the possible adverse reactions/side effects for all the Medicines you take and that have been prescribed to you. Take any new Medicines after you have completely understood and accpet all the possible adverse reactions/side effects.   Do not drive, operate heavy machinery, perform activities at heights, swimming or participation in water activities or provide baby sitting services if your were admitted for syncope or siezures until you have seen by Primary MD or a Neurologist and advised to do so again.  Do not drive when taking Pain medications.   Procedures/Studies: DG Lumbar Spine 2-3 Views  Result Date: 10/26/2023 CLINICAL DATA:  Lower back pain. EXAM: LUMBAR SPINE - 2-3 VIEW COMPARISON:  September 21, 2023. FINDINGS: Mild grade 1 retrolisthesis of L3-4 is noted secondary to moderate degenerative disc disease at this level. Moderate degenerative disc disease is also noted at L5-S1. Mild degenerative disc disease is noted at L1-2, L2-3 and L4-5. No fracture is noted. IMPRESSION: Multilevel degenerative disc disease.  No acute abnormality seen. Electronically Signed   By: Lupita Raider M.D.   On: 10/26/2023 18:24   DG Thoracic Spine 2 View  Result Date: 10/26/2023 CLINICAL DATA:  Upper  back pain. EXAM: THORACIC SPINE 2 VIEWS COMPARISON:  September 21, 2023. FINDINGS: No fracture or spondylolisthesis is noted. Mild dextroscoliosis of thoracic spine is noted. Mild to moderate osteophyte formation is seen at multiple levels of the thoracic spine. IMPRESSION: Multilevel degenerative changes are noted. No acute abnormality is noted. Electronically Signed   By: Lupita Raider M.D.   On: 10/26/2023 18:21   DG Chest Port 1 View  Result Date: 10/23/2023 CLINICAL DATA:  Shortness of breath. EXAM: PORTABLE CHEST 1 VIEW COMPARISON:  10/02/2023 FINDINGS: The cardio pericardial silhouette is enlarged. Bibasilar  collapse/consolidative opacity evident with superimposed vascular congestion. Right PICC line tip overlies the mid SVC level, as before. Bones are diffusely demineralized. Telemetry leads overlie the chest. IMPRESSION: Interval development of bibasilar collapse/consolidative opacity with superimposed vascular congestion. Pneumonia not excluded. Electronically Signed   By: Kennith Center M.D.   On: 10/23/2023 06:22   CT Head Wo Contrast  Result Date: 10/23/2023 CLINICAL DATA:  For give full. Mental status changes unknown cause. Cervical spine trauma. EXAM: CT HEAD WITHOUT CONTRAST CT CERVICAL SPINE WITHOUT CONTRAST TECHNIQUE: Multidetector CT imaging of the head and cervical spine was performed following the standard protocol without intravenous contrast. Multiplanar CT image reconstructions of the cervical spine were also generated. RADIATION DOSE REDUCTION: This exam was performed according to the departmental dose-optimization program which includes automated exposure control, adjustment of the mA and/or kV according to patient size and/or use of iterative reconstruction technique. COMPARISON:  09/09/2023 FINDINGS: CT HEAD FINDINGS Brain: No evidence of acute infarction, hemorrhage, hydrocephalus, extra-axial collection or mass lesion/mass effect. Generalized brain atrophy. Vascular: Marked  enlargement and elongation/tortuosity of major intracranial vessels, which are also atheromatous. This is likely from chronic hypertension and stable. Skull: No acute fracture. Sinuses/Orbits: No visible injury. CT CERVICAL SPINE FINDINGS Alignment: Normal. Skull base and vertebrae: No acute fracture. No primary bone lesion or focal pathologic process. Generalized atrophy. Soft tissues and spinal canal: No prevertebral fluid or swelling. No visible canal hematoma. Disc levels: Generalized degenerative endplate and facet spurring. C3-4 ankylosis. C5-C7 ankylosis. Upper chest: Layering pleural effusions. There is dedicated chest x-ray pending. IMPRESSION: No evidence of acute intracranial or cervical spine injury. Electronically Signed   By: Tiburcio Pea M.D.   On: 10/23/2023 05:55   CT Cervical Spine Wo Contrast  Result Date: 10/23/2023 CLINICAL DATA:  For give full. Mental status changes unknown cause. Cervical spine trauma. EXAM: CT HEAD WITHOUT CONTRAST CT CERVICAL SPINE WITHOUT CONTRAST TECHNIQUE: Multidetector CT imaging of the head and cervical spine was performed following the standard protocol without intravenous contrast. Multiplanar CT image reconstructions of the cervical spine were also generated. RADIATION DOSE REDUCTION: This exam was performed according to the departmental dose-optimization program which includes automated exposure control, adjustment of the mA and/or kV according to patient size and/or use of iterative reconstruction technique. COMPARISON:  09/09/2023 FINDINGS: CT HEAD FINDINGS Brain: No evidence of acute infarction, hemorrhage, hydrocephalus, extra-axial collection or mass lesion/mass effect. Generalized brain atrophy. Vascular: Marked enlargement and elongation/tortuosity of major intracranial vessels, which are also atheromatous. This is likely from chronic hypertension and stable. Skull: No acute fracture. Sinuses/Orbits: No visible injury. CT CERVICAL SPINE FINDINGS  Alignment: Normal. Skull base and vertebrae: No acute fracture. No primary bone lesion or focal pathologic process. Generalized atrophy. Soft tissues and spinal canal: No prevertebral fluid or swelling. No visible canal hematoma. Disc levels: Generalized degenerative endplate and facet spurring. C3-4 ankylosis. C5-C7 ankylosis. Upper chest: Layering pleural effusions. There is dedicated chest x-ray pending. IMPRESSION: No evidence of acute intracranial or cervical spine injury. Electronically Signed   By: Tiburcio Pea M.D.   On: 10/23/2023 05:55   VAS [place] UPPER EXTREMITY VENOUS DUPLEX  Result Date: 10/04/2023 UPPER VENOUS STUDY  Patient Name:  Mathew Simpson  Date of Exam:   10/04/2023 Medical Rec #: [id number]      Accession #:    [id number] Date of Birth: Apr 24, 1936       Patient Gender: M Patient Age:   20 years Exam Location:  [facility] Procedure:  VAS [place] UPPER EXTREMITY VENOUS DUPLEX Referring Phys: ERIC [place] --------------------------------------------------------------------------------  Indications: Fever & positive d-dimer (2.08) Other Indications: PICC line RUE (basilic). Limitations: Line and patient condition. Comparison Study: No previous exams Performing Technologist: Jody Hill RVT, RDMS  Examination Guidelines: A complete evaluation includes B-mode imaging, spectral Doppler, color Doppler, and power Doppler as needed of all accessible portions of each vessel. Bilateral testing is considered an integral part of a complete examination. Limited examinations for reoccurring indications may be performed as noted.  Right Findings: +----------+------------+---------+-----------+----------+--------------------+ RIGHT     CompressiblePhasicitySpontaneousProperties      Summary        +----------+------------+---------+-----------+----------+--------------------+ IJV           Full       Yes       Yes                                    +----------+------------+---------+-----------+----------+--------------------+ Subclavian               Yes       Yes                   patent by                                                              color/doppler     +----------+------------+---------+-----------+----------+--------------------+ Axillary      Full       Yes       Yes                                   +----------+------------+---------+-----------+----------+--------------------+ Brachial                                               Not visualized    +----------+------------+---------+-----------+----------+--------------------+ Radial        Full                                                       +----------+------------+---------+-----------+----------+--------------------+ Ulnar         Full                                                       +----------+------------+---------+-----------+----------+--------------------+ Cephalic      None       No        No                      Acute         +----------+------------+---------+-----------+----------+--------------------+ Basilic       Full       Yes       Yes                                   +----------+------------+---------+-----------+----------+--------------------+  Unable to compress subclavian due to patient position & condition. Patent through color and doppler. Limited visualization of basilic due to PICC placement, visualized segments patent. Unable to visualized brachial vein due to PICC line placment.  Left Findings: +----------+------------+---------+-----------+----------+-------+ LEFT      CompressiblePhasicitySpontaneousPropertiesSummary +----------+------------+---------+-----------+----------+-------+ Subclavian    Full       Yes       Yes                      +----------+------------+---------+-----------+----------+-------+  Summary:  Right: No evidence of deep vein thrombosis in the upper  extremity. Findings consistent with acute superficial vein thrombosis involving the right cephalic vein. However, unable to visualize the brachial veins. Portions of this exam were limited,please see tech comments.  *See table(s) above for measurements and observations.  Diagnosing physician: Carolynn Sayers Electronically signed by Carolynn Sayers on 10/04/2023 at 12:57:30 PM.    Final    VAS [place] LOWER EXTREMITY VENOUS (DVT)  Result Date: 10/04/2023  Lower Venous DVT Study Patient Name:  CLAVIN RUHLMAN  Date of Exam:   10/04/2023 Medical Rec #: [id number]      Accession #:    [id number] Date of Birth: 1936/03/25       Patient Gender: M Patient Age:   83 years Exam Location:  [facility] Procedure:      VAS [place] LOWER EXTREMITY VENOUS (DVT) Referring Phys: ERIC [place] --------------------------------------------------------------------------------  Indications: Pain, and Swelling. Other Indications: Septic left knee joint. Comparison Study: Previous exam 09/18/2019 was negative for DVT. Performing Technologist: Ernestene Mention RVT, RDMS  Examination Guidelines: A complete evaluation includes B-mode imaging, spectral Doppler, color Doppler, and power Doppler as needed of all accessible portions of each vessel. Bilateral testing is considered an integral part of a complete examination. Limited examinations for reoccurring indications may be performed as noted. The reflux portion of the exam is performed with the patient in reverse Trendelenburg.  +-----+---------------+---------+-----------+----------+--------------+ RIGHTCompressibilityPhasicitySpontaneityPropertiesThrombus Aging +-----+---------------+---------+-----------+----------+--------------+ CFV  Full           Yes      Yes                                 +-----+---------------+---------+-----------+----------+--------------+   +---------+---------------+---------+-----------+----------+--------------+ LEFT      CompressibilityPhasicitySpontaneityPropertiesThrombus Aging +---------+---------------+---------+-----------+----------+--------------+ CFV      Full           Yes      Yes                                 +---------+---------------+---------+-----------+----------+--------------+ SFJ      Full                                                        +---------+---------------+---------+-----------+----------+--------------+ FV Prox  Full           Yes      Yes                                 +---------+---------------+---------+-----------+----------+--------------+ FV Mid   Full           Yes      Yes                                 +---------+---------------+---------+-----------+----------+--------------+  FV DistalFull           Yes      Yes                                 +---------+---------------+---------+-----------+----------+--------------+ PFV      Full                                                        +---------+---------------+---------+-----------+----------+--------------+ POP      Full           Yes      Yes                                 +---------+---------------+---------+-----------+----------+--------------+ PTV      Full                                                        +---------+---------------+---------+-----------+----------+--------------+ PERO     Full                                                        +---------+---------------+---------+-----------+----------+--------------+     Summary: RIGHT: - No evidence of common femoral vein obstruction.   LEFT: - There is no evidence of deep vein thrombosis in the lower extremity.  - No cystic structure found in the popliteal fossa. - Ultrasound characteristics of enlarged lymph nodes noted in the groin.  *See table(s) above for measurements and observations. Electronically signed by Carolynn Sayers on 10/04/2023 at 12:57:02 PM.    Final    DG Chest Port 1  View  Result Date: 10/02/2023 CLINICAL DATA:  Questionable sepsis EXAM: PORTABLE CHEST 1 VIEW COMPARISON:  09/29/2023 FINDINGS: Heart borderline in size. Mediastinal contours within normal limits. Aortic atherosclerosis. Lungs clear. No effusions. Right PICC line in place with the tip in the SVC. IMPRESSION: No active disease. Electronically Signed   By: Charlett Nose M.D.   On: 10/02/2023 22:09   DG Chest Port 1 View  Result Date: 09/29/2023 CLINICAL DATA:  PICC line placement EXAM: PORTABLE CHEST 1 VIEW COMPARISON:  09/20/2023 FINDINGS: Single frontal view of the chest demonstrates right-sided PICC, tip overlying superior vena cava. The cardiac silhouette is stable, with continued ectasia and atherosclerosis of the thoracic aorta. No acute airspace disease, effusion, or pneumothorax. No acute bony abnormalities. IMPRESSION: 1. Right-sided PICC, tip overlying superior vena cava. 2. No acute intrathoracic process. 3. Stable chronic ectasia of the thoracic aorta, compatible with thoracic aortic aneurysm seen on recent CT chest. Electronically Signed   By: Sharlet Salina M.D.   On: 09/29/2023 19:22   [place] EKG SITE RITE  Result Date: 09/29/2023 If Site Rite image not attached, placement could not be confirmed due to current cardiac rhythm.    The results of significant diagnostics from this hospitalization (including imaging, microbiology, ancillary and laboratory) are listed below for reference.  Microbiology: Recent Results (from the past 240 hour(s))  Culture, blood (routine x 2)     Status: None (Preliminary result)   Collection Time: 10/23/23  5:25 AM   Specimen: BLOOD  Result Value Ref Range Status   Specimen Description   Final    BLOOD BLOOD LEFT HAND Performed at [facility], [address] [place], [place] [address]    Special Requests   Final    BOTTLES DRAWN AEROBIC AND ANAEROBIC Blood Culture adequate volume Performed at [facility], [address], [place], [place] [address]    Culture   Final    NO GROWTH 3 DAYS Performed at [facility], [address], [place], [place] [address]    Report Status PENDING  Incomplete  Culture, blood (routine x 2)     Status: None (Preliminary result)   Collection Time: 10/23/23  5:29 AM   Specimen: BLOOD  Result Value Ref Range Status   Specimen Description   Final    BLOOD BLOOD LEFT FOREARM Performed at [facility], [address], [place], [place] [address]    Special Requests   Final    BOTTLES DRAWN AEROBIC AND ANAEROBIC Blood Culture adequate volume Performed at [facility], [address], [place], [place] [address]    Culture   Final    NO GROWTH 3 DAYS Performed at [facility], [address], [place], [place] [address]    Report Status PENDING  Incomplete     Labs: BNP (last 3 results) Recent Labs    10/23/23 0520  BNP 1,817.4*   Basic Metabolic Panel: Recent Labs  Lab 10/23/23 0519 10/23/23 0746 10/24/23 0402 10/25/23 0250 10/26/23 0207  NA 139  --  141 142 144  K 3.2*  --  3.0* 4.0 3.4*  CL 100  --  100 104 102  CO2 26  --  28 30 26   GLUCOSE 150*  --  137* 132* 142*  BUN 31*  --  37* 40* 46*  CREATININE 1.34*  --  1.44* 1.35* 1.38*  CALCIUM 8.4*  --  8.1* 8.0* 8.3*  MG  --  2.1  --   --   --   PHOS  --  4.8*  --   --   --    Liver Function Tests: Recent Labs  Lab 10/23/23 0519 10/24/23 0402  AST 22 19  ALT <5 5  ALKPHOS 111 85  BILITOT 1.0 0.8  PROT 6.7 5.9*  ALBUMIN 2.6* 2.4*   No results for input(s): "LIPASE", "AMYLASE" in the last 168 hours. Recent Labs  Lab 10/24/23 1148 10/26/23 1154  AMMONIA 17 14   CBC: Recent Labs  Lab 10/23/23 0519 10/24/23 0402 10/26/23 1154  WBC 7.4 7.8 9.1  NEUTROABS 5.8  --   --   HGB 8.9* 7.7* 8.9*  HCT 28.3* 25.0* 28.6*  MCV 97.6 100.0 99.7  PLT 245 214 226   Cardiac Enzymes: No results for input(s): "CKTOTAL", "CKMB", "CKMBINDEX",  "TROPONINI" in the last 168 hours. BNP: Invalid input(s): "POCBNP" CBG: Recent Labs  Lab 10/25/23 1132 10/25/23 1635 10/25/23 2114 10/26/23 0736 10/26/23 1159  GLUCAP 118* 128* 122* 161* 128*   D-Dimer No results for input(s): "DDIMER" in the last 72 hours. Hgb A1c No results for input(s): "HGBA1C" in the last 72 hours. Lipid Profile No results for input(s): "CHOL", "HDL", "LDLCALC", "TRIG", "CHOLHDL", "LDLDIRECT" in the last 72 hours. Thyroid function  studies Recent Labs    10/26/23 1204  TSH 0.322*   Anemia work up Recent Labs    10/26/23 1154  VITAMINB12 733   Urinalysis    Component Value Date/Time   COLORURINE YELLOW 10/23/2023 0745   APPEARANCEUR HAZY (A) 10/23/2023 0745   LABSPEC 1.013 10/23/2023 0745   PHURINE 5.0 10/23/2023 0745   GLUCOSEU NEGATIVE 10/23/2023 0745   HGBUR LARGE (A) 10/23/2023 0745   BILIRUBINUR NEGATIVE 10/23/2023 0745   KETONESUR NEGATIVE 10/23/2023 0745   PROTEINUR 30 (A) 10/23/2023 0745   NITRITE NEGATIVE 10/23/2023 0745   LEUKOCYTESUR SMALL (A) 10/23/2023 0745   Sepsis Labs Recent Labs  Lab 10/23/23 0519 10/24/23 0402 10/26/23 1154  WBC 7.4 7.8 9.1   Microbiology Recent Results (from the past 240 hour(s))  Culture, blood (routine x 2)     Status: None (Preliminary result)   Collection Time: 10/23/23  5:25 AM   Specimen: BLOOD  Result Value Ref Range Status   Specimen Description   Final    BLOOD BLOOD LEFT HAND Performed at [facility], [address], [place], [place] [address]    Special Requests   Final    BOTTLES DRAWN AEROBIC AND ANAEROBIC Blood Culture adequate volume Performed at [facility], [address], [place], [place] [address]    Culture   Final    NO GROWTH 3 DAYS Performed at [facility], [address], [place], [place] [address]    Report Status PENDING  Incomplete  Culture, blood (routine x 2)     Status: None (Preliminary result)   Collection  Time: 10/23/23  5:29 AM   Specimen: BLOOD  Result Value Ref Range Status   Specimen Description   Final    BLOOD BLOOD LEFT FOREARM Performed at [facility], [address], [place], [place] [address]    Special Requests   Final    BOTTLES DRAWN AEROBIC AND ANAEROBIC Blood Culture adequate volume Performed at [facility], [address], [place], [place] [address]    Culture   Final    NO GROWTH 3 DAYS Performed at [facility], [address] [place], [place] [address]    Report Status PENDING  Incomplete     Time coordinating discharge:  I have spent 35 minutes face to face with the patient and on the ward discussing the patients care, assessment, plan and disposition with other care givers. >50% of the time was devoted counseling the patient about the risks and benefits of treatment/Discharge disposition and coordinating care.   SIGNED:   Miguel Rota, MD  [facility] , 1:40 PM   If 7PM-7AM, please contact night-coverage

## 2023-10-27 NOTE — Progress Notes (Signed)
[facility]:  Patient was referred by Rubin Payor for [facility] in [place].  We have reviewed chart, discussed comfort care with daughter, Jacki Cones, and our MD has approved patient for inpatient hospice bed. We can accept patient today and have updated case Production designer, theatre/television/film.   Norm Parcel, RN [phone]

## 2023-10-27 NOTE — Progress Notes (Signed)
Palliative Medicine Progress Note   Patient Name: Mathew Simpson       Date: 11/10/2023 DOB: 12-09-1936  Age: 87 y.o. MRN#: [id number] Attending Physician: Miguel Rota, MD Primary Care Physician: Ronnald Collum Admit Date: 10/23/2023   HPI/Patient Profile: 87 y.o. male  with past medical history of HFpEF, osteoarthritis, chronic atrial fibrillation, hypertension, glaucoma, hyper bili Ruben anemia, subdural hematoma, protein malnutrition, and infection of prosthetic left knee joint who was brought to the ED from SNF on 10/23/2023 due to dyspnea and hypoxia.  Chest x-ray showed bibasilar crackles/consolidation.  He was admitted with acute on chronic diastolic CHF.  Hospital course complicated by acute metabolic encephalopathy and delirium.   Palliative Medicine was consulted for goals of care and medical decision making.    Subjective: Chart reviewed and patient assessed at bedside. Discussed with RN who is in the room at the time of my visit.   Per MAR, patient has received 10 mg IV morphine in the last 24 hours. He is confused, but able to answer basic questions. He overall appears more comfortable compared to yesterday, but continues to report pain. Plan to start scheduled oral morphine for improved pain control.  Patient has been reviewed by [facility] and approved for their inpatient facility in [facility]. There is a bed available today, with pending transfer later this afternoon.   I spoke with daughter/Laurie by phone and updated her on plan of care. Reviewed natural trajectory at end-of-life. Emotional support provided.    Objective:  Physical Exam Vitals reviewed.  Constitutional:      General: He is awake. He is not in acute distress.    Appearance: He is  ill-appearing.  Pulmonary:     Effort: No respiratory distress.  Neurological:     Mental Status: He is confused.            Palliative Medicine Assessment & Plan   Assessment: Principal Problem:   Acute on chronic diastolic congestive heart failure (HCC) Active Problems:   Essential hypertension   Chronic atrial fibrillation (HCC)   Hypokalemia   DM2 (diabetes mellitus, type 2) (HCC)   Protein-calorie malnutrition, moderate (HCC)   Prolonged QT interval   Normocytic anemia   Elevated serum creatinine   Supratherapeutic INR   Acute metabolic encephalopathy  Pressure injury of skin   Acute on chronic diastolic (congestive) heart failure (HCC)    Recommendations/Plan: Continue full comfort measures Start scheduled morphine concentrate solution 5 mg every 4 hours PRN medications are available for symptom management as per below Pending transfer to inpatient hospice facility in [facility]   Code Status: DNR - comfort  Prognosis:  < 2 weeks  Discharge Planning: Hospice facility   Thank you for allowing the Palliative Medicine Team to assist in the care of this patient.   Time 35 minutes  Detailed review of medical records (labs, imaging, vital signs), medically appropriate exam, discussed with treatment team, counseling and education to patient, family, & staff, documenting clinical information, medication management, coordination of care.    Merry Proud, NP   Please contact Palliative Medicine Team phone at [phone] for questions and concerns.  For individual providers, please see AMION.

## 2023-10-27 NOTE — Progress Notes (Signed)
Heart Failure Navigator Progress Note  Assessed for Heart & Vascular TOC clinic readiness.  Patient does not meet criteria due to transitioned to comfort care.   Rhae Hammock, BSN, Scientist, clinical (histocompatibility and immunogenetics) Only

## 2023-10-27 NOTE — TOC Transition Note (Addendum)
Transition of Care [facility]) - CM/SW Discharge Note   Patient Details  Name: Mathew Simpson MRN: [id number] Date of Birth: 01/11/1936  Transition of Care [facility]) CM/SW Contact:  Howell Rucks, RN Phone Number: , 2:05 PM   Clinical Narrative:  DC order to inpatient hospice, [facility], pt's dtr completed paperwork. Cheri- admissions coordinator confirmed bed available for transfer today, bed # pending, PTAR call for transport. No further TOC needs identified.      Final next level of care: Hospice Medical Facility Barriers to Discharge: Barriers Resolved   Patient Goals and CMS Choice CMS Medicare.gov Compare Post Acute Care list provided to:: Patient Represenative (must comment) Chrys Racer (Daughter)  [phone] (Mobile)) Choice offered to / list presented to : Adult Children Chrys Racer (Daughter)  [phone] (Mobile))  Discharge Placement                    Name of family member notified: Chrys Racer (Daughter)  [phone] (Mobile) Patient and family notified of of transfer:   Discharge Plan and Services Additional resources added to the After Visit Summary for       Post Acute Care Choice: Skilled Nursing Facility, Hospice                               Social Determinants of Health (SDOH) Interventions SDOH Screenings   Food Insecurity: No Food Insecurity (10/23/2023)  Housing: Low Risk  (10/23/2023)  Transportation Needs: No Transportation Needs (10/23/2023)  Utilities: Not At Risk (10/23/2023)  Tobacco Use: Medium Risk (10/23/2023)     Readmission Risk Interventions    10/25/2023    3:34 PM 07/23/2022   11:40 AM 07/21/2022   10:11 AM  Readmission Risk Prevention Plan  Post Dischage Appt  Complete Complete  Medication Screening  Complete Complete  Transportation Screening Complete Complete Complete  Medication Review (RN Futures trader) Referral to Pharmacy    PCP or Specialist  appointment within 3-5 days of discharge Complete    HRI or Home Care Consult Not Complete    HRI or Home Care Consult Pt Refusal Comments Patient plans to return to SNF for short term rehab.    SW Recovery Care/Counseling Consult Complete    Palliative Care Screening Complete    Skilled Nursing Facility Complete

## 2023-10-28 LAB — CULTURE, BLOOD (ROUTINE X 2)
Culture: NO GROWTH
Culture: NO GROWTH
Special Requests: ADEQUATE
Special Requests: ADEQUATE

## 2023-10-29 LAB — VITAMIN B1: Vitamin B1 (Thiamine): 89.3 nmol/L (ref 66.5–200.0)

## 2023-11-04 ENCOUNTER — Ambulatory Visit: Payer: Medicare HMO | Admitting: Internal Medicine

## 2023-11-22 DEATH — deceased
